# Patient Record
Sex: Female | Born: 1935 | Race: White | Hispanic: No | Smoking: Never smoker
Health system: Southern US, Community
[De-identification: ages and names within clinical notes are randomized; demographics above are authoritative.]

## PROBLEM LIST (undated history)

## (undated) DIAGNOSIS — N186 End stage renal disease: Secondary | ICD-10-CM

## (undated) DIAGNOSIS — R05 Cough: Secondary | ICD-10-CM

## (undated) DIAGNOSIS — J449 Chronic obstructive pulmonary disease, unspecified: Secondary | ICD-10-CM

## (undated) DIAGNOSIS — J45909 Unspecified asthma, uncomplicated: Secondary | ICD-10-CM

## (undated) DIAGNOSIS — E785 Hyperlipidemia, unspecified: Secondary | ICD-10-CM

## (undated) DIAGNOSIS — R058 Other specified cough: Secondary | ICD-10-CM

## (undated) DIAGNOSIS — G4733 Obstructive sleep apnea (adult) (pediatric): Secondary | ICD-10-CM

## (undated) DIAGNOSIS — I219 Acute myocardial infarction, unspecified: Secondary | ICD-10-CM

## (undated) DIAGNOSIS — I739 Peripheral vascular disease, unspecified: Secondary | ICD-10-CM

## (undated) DIAGNOSIS — E662 Morbid (severe) obesity with alveolar hypoventilation: Secondary | ICD-10-CM

## (undated) DIAGNOSIS — T464X5A Adverse effect of angiotensin-converting-enzyme inhibitors, initial encounter: Secondary | ICD-10-CM

## (undated) DIAGNOSIS — I2510 Atherosclerotic heart disease of native coronary artery without angina pectoris: Secondary | ICD-10-CM

## (undated) DIAGNOSIS — Z992 Dependence on renal dialysis: Secondary | ICD-10-CM

## (undated) DIAGNOSIS — E669 Obesity, unspecified: Secondary | ICD-10-CM

## (undated) DIAGNOSIS — E039 Hypothyroidism, unspecified: Secondary | ICD-10-CM

## (undated) DIAGNOSIS — I359 Nonrheumatic aortic valve disorder, unspecified: Secondary | ICD-10-CM

## (undated) DIAGNOSIS — I509 Heart failure, unspecified: Secondary | ICD-10-CM

## (undated) DIAGNOSIS — I639 Cerebral infarction, unspecified: Secondary | ICD-10-CM

## (undated) DIAGNOSIS — I10 Essential (primary) hypertension: Secondary | ICD-10-CM

## (undated) DIAGNOSIS — R911 Solitary pulmonary nodule: Secondary | ICD-10-CM

## (undated) DIAGNOSIS — I701 Atherosclerosis of renal artery: Secondary | ICD-10-CM

## (undated) DIAGNOSIS — I779 Disorder of arteries and arterioles, unspecified: Secondary | ICD-10-CM

## (undated) HISTORY — PX: AV FISTULA PLACEMENT: SHX1204

## (undated) HISTORY — DX: Peripheral vascular disease, unspecified: I73.9

## (undated) HISTORY — DX: Morbid (severe) obesity with alveolar hypoventilation: E66.2

## (undated) HISTORY — DX: Obesity, unspecified: E66.9

## (undated) HISTORY — DX: Hypothyroidism, unspecified: E03.9

## (undated) HISTORY — DX: Hyperlipidemia, unspecified: E78.5

## (undated) HISTORY — DX: Cough: R05

## (undated) HISTORY — DX: Other specified cough: R05.8

## (undated) HISTORY — PX: DIALYSIS FISTULA CREATION: SHX611

## (undated) HISTORY — DX: Cerebral infarction, unspecified: I63.9

## (undated) HISTORY — DX: Obstructive sleep apnea (adult) (pediatric): G47.33

## (undated) HISTORY — DX: Solitary pulmonary nodule: R91.1

## (undated) HISTORY — DX: Atherosclerotic heart disease of native coronary artery without angina pectoris: I25.10

## (undated) HISTORY — DX: Adverse effect of angiotensin-converting-enzyme inhibitors, initial encounter: T46.4X5A

## (undated) HISTORY — DX: Unspecified asthma, uncomplicated: J45.909

## (undated) HISTORY — DX: Nonrheumatic aortic valve disorder, unspecified: I35.9

## (undated) HISTORY — DX: Atherosclerosis of renal artery: I70.1

## (undated) HISTORY — DX: Disorder of arteries and arterioles, unspecified: I77.9

## (undated) HISTORY — DX: Essential (primary) hypertension: I10

---

## 1971-09-06 HISTORY — PX: TUBAL LIGATION: SHX77

## 1977-09-05 HISTORY — PX: INNER EAR SURGERY: SHX679

## 1983-09-06 HISTORY — PX: ABDOMINAL HYSTERECTOMY: SHX81

## 1994-09-05 HISTORY — PX: CORONARY ARTERY BYPASS GRAFT: SHX141

## 2004-07-01 ENCOUNTER — Ambulatory Visit: Payer: Self-pay | Admitting: Family Medicine

## 2005-03-02 ENCOUNTER — Ambulatory Visit: Payer: Self-pay | Admitting: Family Medicine

## 2005-03-05 ENCOUNTER — Ambulatory Visit: Payer: Self-pay | Admitting: Family Medicine

## 2005-04-05 ENCOUNTER — Ambulatory Visit: Payer: Self-pay | Admitting: Family Medicine

## 2005-05-06 ENCOUNTER — Ambulatory Visit: Payer: Self-pay | Admitting: Family Medicine

## 2005-07-19 ENCOUNTER — Ambulatory Visit: Payer: Self-pay | Admitting: Family Medicine

## 2005-09-13 ENCOUNTER — Ambulatory Visit: Payer: Self-pay | Admitting: Cardiology

## 2005-09-29 ENCOUNTER — Ambulatory Visit: Payer: Self-pay | Admitting: Cardiology

## 2005-10-26 ENCOUNTER — Ambulatory Visit: Payer: Self-pay | Admitting: Cardiology

## 2005-12-05 ENCOUNTER — Ambulatory Visit: Payer: Self-pay | Admitting: Cardiology

## 2005-12-06 ENCOUNTER — Ambulatory Visit: Payer: Self-pay | Admitting: Cardiology

## 2005-12-06 ENCOUNTER — Ambulatory Visit (HOSPITAL_COMMUNITY): Admission: RE | Admit: 2005-12-06 | Discharge: 2005-12-06 | Payer: Self-pay | Admitting: Cardiology

## 2006-01-05 ENCOUNTER — Ambulatory Visit: Payer: Self-pay | Admitting: Nurse Practitioner

## 2006-01-05 ENCOUNTER — Encounter: Payer: Self-pay | Admitting: Pulmonary Disease

## 2006-08-03 ENCOUNTER — Ambulatory Visit: Payer: Self-pay | Admitting: Family Medicine

## 2007-04-24 ENCOUNTER — Ambulatory Visit: Payer: Self-pay | Admitting: Internal Medicine

## 2007-06-08 ENCOUNTER — Ambulatory Visit: Payer: Self-pay | Admitting: Internal Medicine

## 2007-06-19 ENCOUNTER — Ambulatory Visit: Payer: Self-pay

## 2007-06-20 ENCOUNTER — Ambulatory Visit: Payer: Self-pay

## 2007-07-27 ENCOUNTER — Ambulatory Visit: Payer: Self-pay | Admitting: Cardiology

## 2007-07-31 ENCOUNTER — Ambulatory Visit: Payer: Self-pay | Admitting: Cardiology

## 2007-07-31 LAB — CONVERTED CEMR LAB
AST: 16 units/L (ref 0–37)
Albumin: 3.9 g/dL (ref 3.5–5.2)
HDL: 49 mg/dL (ref >39)
Indirect Bilirubin: 0.7 mg/dL (ref 0.0–0.9)
LDL Cholesterol: 95 mg/dL (ref 0–99)
Total Protein: 6.8 g/dL (ref 6.0–8.3)
Triglycerides: 68 mg/dL (ref <150)
VLDL: 14 mg/dL (ref 0–40)

## 2007-08-06 ENCOUNTER — Ambulatory Visit: Payer: Self-pay | Admitting: Family Medicine

## 2007-09-28 ENCOUNTER — Ambulatory Visit: Payer: Self-pay | Admitting: Cardiology

## 2007-09-28 LAB — CONVERTED CEMR LAB
AST: 17 units/L (ref 0–37)
Alkaline Phosphatase: 102 units/L (ref 39–117)
Bilirubin, Direct: 0.1 mg/dL (ref 0.0–0.3)
HDL: 47 mg/dL (ref >39)
Indirect Bilirubin: 0.5 mg/dL (ref 0.0–0.9)
LDL Cholesterol: 76 mg/dL (ref 0–99)
Total Bilirubin: 0.6 mg/dL (ref 0.3–1.2)

## 2008-01-22 ENCOUNTER — Ambulatory Visit: Payer: Self-pay | Admitting: Cardiology

## 2008-07-18 ENCOUNTER — Ambulatory Visit: Payer: Self-pay | Admitting: Cardiology

## 2008-08-07 ENCOUNTER — Ambulatory Visit: Payer: Self-pay | Admitting: Family Medicine

## 2008-08-13 ENCOUNTER — Encounter: Payer: Self-pay | Admitting: Cardiology

## 2008-08-13 ENCOUNTER — Ambulatory Visit: Payer: Self-pay

## 2008-09-05 HISTORY — PX: CATARACT EXTRACTION: SUR2

## 2008-10-24 ENCOUNTER — Ambulatory Visit: Payer: Self-pay | Admitting: Cardiology

## 2008-10-24 LAB — CONVERTED CEMR LAB
ALT: 15 units/L (ref 0–35)
AST: 13 units/L (ref 0–37)
BUN: 25 mg/dL — ABNORMAL HIGH (ref 6–23)
Calcium: 8.9 mg/dL (ref 8.4–10.5)
Creatinine, Ser: 1.47 mg/dL — ABNORMAL HIGH (ref 0.40–1.20)
HCT: 41.8 % (ref 36.0–46.0)
HDL: 42 mg/dL (ref >39)
Hemoglobin: 14.5 g/dL (ref 12.0–15.0)
MCHC: 34.7 g/dL (ref 30.0–36.0)
MCV: 92.5 fL (ref 78.0–100.0)
Platelets: 183 10*3/uL (ref 150–400)
Pro B Natriuretic peptide (BNP): 105.5 pg/mL — ABNORMAL HIGH (ref 0.0–100.0)
RDW: 13.7 % (ref 11.5–15.5)
Total Bilirubin: 0.8 mg/dL (ref 0.3–1.2)
Total CHOL/HDL Ratio: 4.3
VLDL: 27 mg/dL (ref 0–40)

## 2008-12-09 ENCOUNTER — Ambulatory Visit: Payer: Medicare Other | Admitting: Family Medicine

## 2009-01-03 ENCOUNTER — Ambulatory Visit: Payer: Medicare Other | Admitting: Family Medicine

## 2009-01-29 DIAGNOSIS — I10 Essential (primary) hypertension: Secondary | ICD-10-CM

## 2009-01-29 DIAGNOSIS — I701 Atherosclerosis of renal artery: Secondary | ICD-10-CM | POA: Insufficient documentation

## 2009-01-29 DIAGNOSIS — I359 Nonrheumatic aortic valve disorder, unspecified: Secondary | ICD-10-CM | POA: Insufficient documentation

## 2009-01-29 DIAGNOSIS — E785 Hyperlipidemia, unspecified: Secondary | ICD-10-CM

## 2009-01-29 DIAGNOSIS — E119 Type 2 diabetes mellitus without complications: Secondary | ICD-10-CM | POA: Insufficient documentation

## 2009-01-29 DIAGNOSIS — N289 Disorder of kidney and ureter, unspecified: Secondary | ICD-10-CM | POA: Insufficient documentation

## 2009-01-29 DIAGNOSIS — E663 Overweight: Secondary | ICD-10-CM | POA: Insufficient documentation

## 2009-01-29 DIAGNOSIS — G4733 Obstructive sleep apnea (adult) (pediatric): Secondary | ICD-10-CM

## 2009-02-03 ENCOUNTER — Ambulatory Visit: Payer: Medicare Other | Admitting: Family Medicine

## 2009-02-17 ENCOUNTER — Ambulatory Visit: Payer: Self-pay | Admitting: Cardiology

## 2009-02-17 ENCOUNTER — Encounter: Payer: Self-pay | Admitting: Cardiology

## 2009-02-17 DIAGNOSIS — I5032 Chronic diastolic (congestive) heart failure: Secondary | ICD-10-CM

## 2009-03-12 ENCOUNTER — Encounter: Payer: Self-pay | Admitting: Cardiology

## 2009-04-16 ENCOUNTER — Telehealth: Payer: Self-pay | Admitting: Cardiology

## 2009-05-20 ENCOUNTER — Encounter: Payer: Self-pay | Admitting: Cardiology

## 2009-05-20 ENCOUNTER — Ambulatory Visit: Payer: Self-pay | Admitting: Cardiovascular Disease

## 2009-05-21 LAB — CONVERTED CEMR LAB
ALT: 13 units/L (ref 0–35)
AST: 12 units/L (ref 0–37)
CO2: 27 meq/L (ref 19–32)
Chloride: 101 meq/L (ref 96–112)
Creatinine, Ser: 1.41 mg/dL — ABNORMAL HIGH (ref 0.40–1.20)
HCT: 41.7 % (ref 36.0–46.0)
Hgb A1c MFr Bld: 8.1 % — ABNORMAL HIGH (ref 4.6–6.1)
MCV: 96.8 fL (ref 78.0–100.0)
Platelets: 182 10*3/uL (ref 150–400)
RDW: 15 % (ref 11.5–15.5)
Sodium: 139 meq/L (ref 135–145)
Total Bilirubin: 0.5 mg/dL (ref 0.3–1.2)
Total CHOL/HDL Ratio: 3
Total Protein: 6.9 g/dL (ref 6.0–8.3)
VLDL: 18 mg/dL (ref 0–40)
Vit D, 25-Hydroxy: 25 ng/mL — ABNORMAL LOW (ref 30–89)
WBC: 9.6 10*3/uL (ref 4.0–10.5)

## 2009-06-01 ENCOUNTER — Ambulatory Visit: Payer: Self-pay | Admitting: Cardiology

## 2009-08-18 ENCOUNTER — Ambulatory Visit: Payer: Self-pay

## 2009-08-18 ENCOUNTER — Encounter: Payer: Self-pay | Admitting: Cardiology

## 2009-08-21 ENCOUNTER — Ambulatory Visit: Payer: Medicare Other | Admitting: Family Medicine

## 2009-09-11 ENCOUNTER — Encounter: Payer: Self-pay | Admitting: Cardiology

## 2009-12-08 ENCOUNTER — Encounter: Payer: Self-pay | Admitting: Cardiovascular Disease

## 2009-12-08 ENCOUNTER — Encounter: Payer: Self-pay | Admitting: Cardiology

## 2009-12-08 LAB — CONVERTED CEMR LAB
ALT: 16 units/L
Albumin: 3.6 g/dL
BUN: 23 mg/dL
BUN: 23 mg/dL
Brain Natriuretic Peptide: 61.9
Chloride: 98 meq/L
Cholesterol: 122 mg/dL
Creatinine, Ser: 1.3 mg/dL
GFR calc Af Amer: 49 mL/min
GFR calc non Af Amer: 40 mL/min
HCT: 41.4 %
HDL: 41.2 mg/dL
Hemoglobin: 13.6 g/dL
LDL Cholesterol: 61.8 mg/dL
RBC: 4.41 M/uL
RDW: 14.6 %
Total Bilirubin: 0.1 mg/dL
Triglyceride fasting, serum: 95 mg/dL
WBC: 9.6 10*3/uL

## 2009-12-10 ENCOUNTER — Encounter: Payer: Self-pay | Admitting: Cardiology

## 2009-12-14 ENCOUNTER — Ambulatory Visit: Payer: Self-pay | Admitting: Cardiology

## 2009-12-14 DIAGNOSIS — R0989 Other specified symptoms and signs involving the circulatory and respiratory systems: Secondary | ICD-10-CM | POA: Insufficient documentation

## 2009-12-15 LAB — CONVERTED CEMR LAB: Pro B Natriuretic peptide (BNP): 82 pg/mL (ref 0.0–100.0)

## 2009-12-17 ENCOUNTER — Encounter: Payer: Self-pay | Admitting: Cardiology

## 2009-12-18 ENCOUNTER — Ambulatory Visit: Payer: Self-pay | Admitting: Cardiology

## 2009-12-18 ENCOUNTER — Encounter: Payer: Self-pay | Admitting: Cardiovascular Disease

## 2009-12-22 LAB — CONVERTED CEMR LAB
CO2: 26 meq/L (ref 19–32)
Chloride: 97 meq/L (ref 96–112)
Creatinine, Ser: 1.24 mg/dL — ABNORMAL HIGH (ref 0.40–1.20)
Glucose, Bld: 170 mg/dL — ABNORMAL HIGH (ref 70–99)

## 2009-12-24 ENCOUNTER — Ambulatory Visit: Payer: Self-pay | Admitting: Cardiology

## 2009-12-24 DIAGNOSIS — R0602 Shortness of breath: Secondary | ICD-10-CM

## 2009-12-24 DIAGNOSIS — R079 Chest pain, unspecified: Secondary | ICD-10-CM

## 2009-12-30 ENCOUNTER — Telehealth (INDEPENDENT_AMBULATORY_CARE_PROVIDER_SITE_OTHER): Payer: Self-pay | Admitting: *Deleted

## 2009-12-30 ENCOUNTER — Encounter: Payer: Self-pay | Admitting: Cardiology

## 2009-12-31 ENCOUNTER — Encounter: Payer: Self-pay | Admitting: Cardiology

## 2009-12-31 ENCOUNTER — Ambulatory Visit: Payer: Self-pay

## 2009-12-31 ENCOUNTER — Ambulatory Visit: Payer: Self-pay | Admitting: Cardiology

## 2009-12-31 ENCOUNTER — Encounter (HOSPITAL_COMMUNITY): Admission: RE | Admit: 2009-12-31 | Discharge: 2010-03-10 | Payer: Self-pay | Admitting: Cardiology

## 2010-01-04 ENCOUNTER — Encounter: Payer: Self-pay | Admitting: Cardiology

## 2010-01-04 ENCOUNTER — Ambulatory Visit: Payer: Self-pay

## 2010-01-15 ENCOUNTER — Ambulatory Visit: Payer: Self-pay | Admitting: Pulmonary Disease

## 2010-01-15 DIAGNOSIS — F329 Major depressive disorder, single episode, unspecified: Secondary | ICD-10-CM

## 2010-01-15 DIAGNOSIS — G2581 Restless legs syndrome: Secondary | ICD-10-CM

## 2010-01-26 ENCOUNTER — Ambulatory Visit (HOSPITAL_COMMUNITY): Admission: RE | Admit: 2010-01-26 | Discharge: 2010-01-26 | Payer: Self-pay | Admitting: Pulmonary Disease

## 2010-01-28 ENCOUNTER — Encounter: Payer: Self-pay | Admitting: Pulmonary Disease

## 2010-02-05 ENCOUNTER — Encounter: Payer: Self-pay | Admitting: Pulmonary Disease

## 2010-02-05 ENCOUNTER — Ambulatory Visit: Payer: Self-pay | Admitting: Pulmonary Disease

## 2010-02-05 DIAGNOSIS — E662 Morbid (severe) obesity with alveolar hypoventilation: Secondary | ICD-10-CM

## 2010-02-08 ENCOUNTER — Ambulatory Visit: Payer: Self-pay | Admitting: Cardiology

## 2010-02-09 ENCOUNTER — Encounter: Payer: Self-pay | Admitting: Pulmonary Disease

## 2010-02-11 ENCOUNTER — Encounter: Payer: Self-pay | Admitting: Pulmonary Disease

## 2010-02-11 LAB — CONVERTED CEMR LAB
Angiotensin 1 Converting Enzyme: 65 units/L (ref 9–67)
Anti Nuclear Antibody(ANA): NEGATIVE
BUN: 26 mg/dL — ABNORMAL HIGH (ref 6–23)
Bilirubin, Direct: 0.1 mg/dL (ref 0.0–0.3)
CO2: 31 meq/L (ref 19–32)
Calcium: 9.1 mg/dL (ref 8.4–10.5)
Chloride: 97 meq/L (ref 96–112)
Creatinine, Ser: 1.2 mg/dL (ref 0.4–1.2)
Eosinophils Absolute: 0.5 10*3/uL (ref 0.0–0.7)
MCHC: 34.9 g/dL (ref 30.0–36.0)
MCV: 94.7 fL (ref 78.0–100.0)
Monocytes Absolute: 0.6 10*3/uL (ref 0.1–1.0)
Neutrophils Relative %: 68.8 % (ref 43.0–77.0)
Platelets: 179 10*3/uL (ref 150.0–400.0)
Rhuematoid fact SerPl-aCnc: 21.7 intl units/mL — ABNORMAL HIGH (ref 0.0–20.0)
Sed Rate: 45 mm/hr — ABNORMAL HIGH (ref 0–22)
Total Bilirubin: 0.6 mg/dL (ref 0.3–1.2)
WBC: 9.1 10*3/uL (ref 4.5–10.5)

## 2010-02-17 ENCOUNTER — Encounter: Payer: Self-pay | Admitting: Pulmonary Disease

## 2010-02-24 ENCOUNTER — Ambulatory Visit: Payer: Self-pay | Admitting: Pulmonary Disease

## 2010-02-24 DIAGNOSIS — J984 Other disorders of lung: Secondary | ICD-10-CM | POA: Insufficient documentation

## 2010-03-02 ENCOUNTER — Ambulatory Visit: Payer: Self-pay | Admitting: Cardiology

## 2010-03-02 DIAGNOSIS — I25810 Atherosclerosis of coronary artery bypass graft(s) without angina pectoris: Secondary | ICD-10-CM

## 2010-03-05 ENCOUNTER — Ambulatory Visit: Payer: Self-pay | Admitting: Cardiology

## 2010-03-09 LAB — CONVERTED CEMR LAB
ALT: 15 units/L (ref 0–35)
AST: 15 units/L (ref 0–37)
Bilirubin, Direct: 0.1 mg/dL (ref 0.0–0.3)
Cholesterol: 121 mg/dL (ref 0–200)
Indirect Bilirubin: 0.3 mg/dL (ref 0.0–0.9)
VLDL: 21 mg/dL (ref 0–40)

## 2010-03-29 ENCOUNTER — Ambulatory Visit: Payer: Self-pay | Admitting: Pulmonary Disease

## 2010-03-29 DIAGNOSIS — J4520 Mild intermittent asthma, uncomplicated: Secondary | ICD-10-CM

## 2010-04-09 ENCOUNTER — Telehealth: Payer: Self-pay | Admitting: Pulmonary Disease

## 2010-04-23 ENCOUNTER — Encounter: Payer: Self-pay | Admitting: Pulmonary Disease

## 2010-04-30 ENCOUNTER — Encounter: Payer: Self-pay | Admitting: Pulmonary Disease

## 2010-07-13 ENCOUNTER — Ambulatory Visit: Payer: Self-pay | Admitting: Cardiology

## 2010-07-20 ENCOUNTER — Ambulatory Visit: Payer: Self-pay | Admitting: Pulmonary Disease

## 2010-07-23 ENCOUNTER — Telehealth: Payer: Self-pay | Admitting: Cardiology

## 2010-07-23 ENCOUNTER — Encounter: Payer: Self-pay | Admitting: Pulmonary Disease

## 2010-07-23 ENCOUNTER — Ambulatory Visit: Payer: Self-pay | Admitting: Internal Medicine

## 2010-07-26 LAB — CONVERTED CEMR LAB
BUN: 28 mg/dL — ABNORMAL HIGH (ref 6–23)
Glucose, Bld: 142 mg/dL — ABNORMAL HIGH (ref 70–99)
Potassium: 4.5 meq/L (ref 3.5–5.3)

## 2010-08-03 ENCOUNTER — Ambulatory Visit: Payer: Self-pay | Admitting: Cardiology

## 2010-09-07 ENCOUNTER — Telehealth (INDEPENDENT_AMBULATORY_CARE_PROVIDER_SITE_OTHER): Payer: Self-pay | Admitting: *Deleted

## 2010-09-07 ENCOUNTER — Encounter: Payer: Self-pay | Admitting: Cardiology

## 2010-09-07 ENCOUNTER — Ambulatory Visit: Admission: RE | Admit: 2010-09-07 | Discharge: 2010-09-07 | Payer: Self-pay | Source: Home / Self Care

## 2010-09-08 ENCOUNTER — Ambulatory Visit: Payer: Medicare Other | Admitting: Family Medicine

## 2010-09-08 ENCOUNTER — Encounter: Payer: Self-pay | Admitting: Cardiology

## 2010-09-08 ENCOUNTER — Ambulatory Visit
Admission: RE | Admit: 2010-09-08 | Discharge: 2010-09-08 | Payer: Self-pay | Source: Home / Self Care | Attending: Cardiology | Admitting: Cardiology

## 2010-09-13 ENCOUNTER — Encounter: Payer: Self-pay | Admitting: Cardiology

## 2010-09-13 LAB — CONVERTED CEMR LAB
Albumin: 3.9 g/dL (ref 3.5–5.2)
Alkaline Phosphatase: 71 units/L (ref 39–117)
CO2: 22 meq/L (ref 19–32)
Chloride: 98 meq/L (ref 96–112)
Creatinine, Ser: 1.68 mg/dL — ABNORMAL HIGH (ref 0.40–1.20)
HDL: 41 mg/dL (ref >39)
LDL Cholesterol: 73 mg/dL (ref 0–99)
Potassium: 4.5 meq/L (ref 3.5–5.3)
Sodium: 132 meq/L — ABNORMAL LOW (ref 135–145)
Total Bilirubin: 0.6 mg/dL (ref 0.3–1.2)
Total CHOL/HDL Ratio: 3.1
Total Protein: 6.6 g/dL (ref 6.0–8.3)
Triglycerides: 64 mg/dL (ref <150)
VLDL: 13 mg/dL (ref 0–40)

## 2010-09-17 ENCOUNTER — Encounter: Payer: Self-pay | Admitting: Cardiology

## 2010-10-06 NOTE — Progress Notes (Signed)
Summary: Nuclear Pre-Procedure  Phone Note Outgoing Call   Call placed by: Perrin Maltese, EMT-P,  December 30, 2009 9:24 AM Summary of Call: Reviewed information on Myoview Information Sheet (see scanned document for further details).  Spoke with patient.     Nuclear Med Background Indications for Stress Test: Evaluation for Ischemia, Graft Patency   History: CABG, Echo, Myocardial Infarction, Myocardial Perfusion Study  History Comments: '96 MI - CABG 10/08 MPS NL EF 77% 12/10 ECHO EF 0000000  Diastolic Dysfunction--CHF Family H/O AAA  Symptoms: Chest Tightness, DOE, SOB    Nuclear Pre-Procedure Cardiac Risk Factors: Hypertension, Lipids, Obesity, PVD Height (in): 61  Nuclear Med Study Referring MD:  D.McLean

## 2010-10-06 NOTE — Letter (Signed)
Summary: [facility] Office Note  [facility] Office Note   Imported By: Sallee Provencal 01/26/2010 14:04:17  _____________________________________________________________________  External Attachment:    Type:   Image     Comment:   External Document

## 2010-10-06 NOTE — Letter (Signed)
Summary: [facility]   Imported By: Bubba Hales 05/17/2010 10:51:24  _____________________________________________________________________  External Attachment:    Type:   Image     Comment:   External Document

## 2010-10-06 NOTE — Assessment & Plan Note (Signed)
Summary: 3 weeks w/pft/apc   Copy to:  [name], [name] Primary Provider/Referring Provider:  Juluis Pitch, MD  CC:  3 wk follow up with PFTs.  Pt states since Cpap pressure was lowered and she is having more difficult time breathing.[name]  History of Present Illness: 75 yo female with OSA, Insomnia, RLS, and Dyspnea.  She continues to get winded with minimal exertion.  She does not have much cough, wheeze, or sputum.  She has not had fever or sweats.    She was recently switched to auto CPAP.  CXR  Procedure date:  01/26/2010  Findings:      CHEST - 2 VIEW   Comparison: 12/06/2005   Findings: Cardiomegaly and evidence of previous cardiac surgery again noted. Pulmonary vascular congestion is identified. Focal opacity on the lateral view of the lower lobe may represent pneumonia or atelectasis. No evidence of pleural effusion or pneumothorax identified. No acute bony abnormalities are present.   IMPRESSION: Cardiomegaly with pulmonary vascular congestion.   Lower lobe focal opacity on the lateral view - question pneumonia versus atelectasis.  Imaging Exam  Procedure date:  01/26/2010  Findings:      NUCLEAR MEDICINE VENTILATION - PERFUSION LUNG SCAN   Technique:  Wash-in, equilibrium, and wash-out phase ventilation images were obtained using Xe-133 gas.  Perfusion images were obtained in multiple projections after intravenous injection of Tc- 21m MAA.   Radiopharmaceuticals:  11.5 mCi Xe-133 gas and 5.5 mCi Tc-23m MAA.   Comparison:  Chest radiograph examination of same date.   Findings:   Ventilation study: On breath-holding and equilibrium phases there is no evidence of lobar or segmental defect.  No significant air trapping was seen on washout phase.   Perfusion study there is normal distribution of the MAA particles. No lobar or segmental perfusion defect is seen.  No ventilation perfusion mismatch is seen.   IMPRESSION: No lobar or  segmental defect is seen on perfusion or ventilation imaging.  No ventilation perfusion mismatch is evident.   Examination is very low probability for pulmonary embolism.  CXR  Procedure date:  02/05/2010  Findings:      CHEST - 2 VIEW   Comparison: 01/26/2010   Findings: The heart is mildly enlarged but stable.  Stable surgical changes.  Stable tortuosity and calcification of the thoracic aorta.  There are chronic bronchitic type lung changes but no definite acute overlying pulmonary process.  Stable advanced degenerative changes in the thoracic spine are noted.   IMPRESSION: Mild stable cardiac enlargement. Chronic lung changes without definite acute overlying pulmonary process.   Current Medications (verified): 1)  Catapres-Tts-2 0.2 Mg/24hr Ptwk (Clonidine Hcl) .... As Directed 2)  Carvedilol 25 Mg Tabs (Carvedilol) .... Take One Tablet By Mouth Twice A Day 3)  Diovan 320 Mg Tabs (Valsartan) .[name].. 1 Tab Once Daily 4)  Furosemide 40 Mg Tabs (Furosemide) .... 2 By Mouth in The Am, and 1 By Mouth in The Pm 5)  Aspirin 81 Mg Tbec (Aspirin) .... Take One Tablet By Mouth Daily 6)  Crestor 40 Mg Tabs (Rosuvastatin Calcium) .... Take One Tablet By Mouth Daily. 7)  Glipizide 10 Mg Tabs (Glipizide) .[name].. 1 By Mouth Two Times A Day 8)  Lantus 100 Unit/ml Soln (Insulin Glargine) .... Use As Directed 9)  Synthroid 100 Mcg Tabs (Levothyroxine Sodium) .[name].. 1 Tab Once Daily 10)  Calcitriol 0.25 Mcg Caps (Calcitriol) .[name].. 1 Tab M, W, F 11)  Fluoxetine Hcl 20 Mg Caps (Fluoxetine Hcl) .[name].. 1 Tab  Once Daily 12)  Tandem 162-115.2 Mg Caps (Ferrous Fum-Iron Polysacch) .[name].. 1 Tab Once Daily  Allergies: 1)  ! Norvasc 2)  ! Codeine 3)  ! Clonidine Hcl 4)  ! Ace Inhibitors 5)  * Metformin  Past History:  Past Medical History: 1. Hyperlipidemia. 2. Resistant hypertension times many years.  The patient does have renal artery stenosis.  She has tried calcium channel blockers in the past and  states that she would not take them now because she had some problems with her gums which her dentist identified as calcium-channel blocker side effects. 3. Type 2 diabetes. 4. Coronary artery disease, status post anterior MI in 1996 followed by coronary artery bypass grafting.  The patient's most recent Myoview was done in October 2008, which showed an EF of 77% with no perfusion defects. 5. Renal artery stenosis.  The patient has an occluded right renal artery and an atrophic right kidney.  There is 20% left renal artery stenosis.  This was seen by catheterization in 2007. 6. History of diastolic congestive heart failure.  Most recent echo (12/10) showed EF 55-60% with mild LVH, grade I diastolic dysfunction, mild LAE.  7. Aortic valve disorder: Echo in 2009 showed mean aortic valve gradient of 13 mmHg, suggesting very mild stenosis.  Echo (12/10) suggested aortic sclerosis only.   8. Obesity. 9.Chronic kidney disease.  Most recent creatinine was from February 2010 and was 1.49.  10. Obstructive sleep apnea       - PSG 01/05/06 AHI 24.8       - CPAP 15 cm H2O 11. Chronic dyspnea      - PFT 02/05/10 FEV1 1.38(&&%), FEV1% 73, TLC 3.78(86%), DLCO 48%, +BD 12. ACE inhibitor cough 13. Hypothyroidism 14. Hypoxemia       - SpO2 83% with exertion       - 2 liters oxygen with exertion and sleep  Vital Signs:  Patient profile:   75 year old female Height:      62 inches Weight:      255 pounds BMI:     46.81 O2 Sat:      92 % on Room air Temp:     97.9 degrees F oral Pulse rate:   64 / minute BP sitting:   116 / 72  (right arm) Cuff size:   large  Vitals Entered By: Raymondo Band RN (February 05, 2010 10:58 AM)  O2 Flow:  Room air CC: 3 wk follow up with PFTs.  Pt states since Cpap pressure was lowered, she is having more difficult time breathing. Comments Medications reviewed with patient Daytime contact number verified with patient. Raymondo Band RN  February 05, 2010 10:58 AM  Ambulatory  Pulse Oximetry  Resting; HR_68____    02 Sat_92% RA____  Lap1 (185 feet)   HR_____   02 Sat_____ Lap2 (185 feet)   HR_____   02 Sat_____    Lap3 (185 feet)   HR_____   02 Sat_____  ___Test Completed without Difficulty _x__Test Stopped due to:       At approx 25ft, pt o2 sat dropped to 83% RA with pulse of 70.  Pt was placed on 2L cont, o2 sat increased to 95% with pulse of 66. Raymondo Band RN  February 05, 2010 11:29 AM    Physical Exam  General:  obese.   Nose:  no deformity, discharge, inflammation, or lesions Mouth:  MP 4, no exudate Neck:  no JVD.   Lungs:  diminished  breath sounds, faint crackle left base, no wheezing Heart:  regular rhythm, normal rate, and no murmurs.   Extremities:  1+ ankle edema Cervical Nodes:  no significant adenopathy   Impression & Recommendations:  Problem # 1:  SLEEP APNEA, OBSTRUCTIVE (ICD-327.23) I adjust her CPAP set up based on review of her auto titration.  Problem # 2:  HYPOXEMIA (ICD-799.02) She has oxygen desaturation with exertion.  Will start her on supplemental oxygen at 2 liters with exertion and sleep.  Will arrange for overnight oximetry while using CPAP and 2 liters supplemental oxygen.  Problem # 3:  INADEQUATE SLEEP HYGIENE (ICD-307.49)  I reviewed proper sleep hygiene technique.  Problem # 4:  RESTLESS LEG SYNDROME (ICD-333.94)  She has symptoms suggestive of restless legs.  She is already on iron supplementation.  I will monitor her symptoms for now, and re-assess whether she needs specific therapy for her legs after her sleep apnea and insomnia are better controlled.  Problem # 5:  SHORTNESS OF BREATH (ICD-786.05) She has interstitial changes on chest xray, and diffusion defect on pulmonary function test.  I will schedule her for CT chest to further evaluate for interstitial lung disease.  Will also have her undergo lab tests to further evaluate.  Depending on these results will determine if a course of prednisone may be  warranted.  She did have mild bronchodilator response, and small airway disease on PFT also.  Will give her empiric trial of proair, and decide if she needs additional inhaler therapy.  Problem # 6:  OVERWEIGHT/OBESITY (ICD-278.02) A good portion of her health problems are likey related to her weight and deconditioning.  Will need to address this further after her other pulmonary issues are more stable.  Medications Added to Medication List This Visit: 1)  Proair Hfa 108 (90 Base) Mcg/act Aers (Albuterol sulfate) .... Two puffs up to four times per day as needed for cough, wheeze, chest congestion, or shortness of breath 2)  Prednisone 20 Mg Tabs (Prednisone) .... 2 pills once daily  Complete Medication List: 1)  Proair Hfa 108 (90 Base) Mcg/act Aers (Albuterol sulfate) .... Two puffs up to four times per day as needed for cough, wheeze, chest congestion, or shortness of breath 2)  Catapres-tts-2 0.2 Mg/24hr Ptwk (Clonidine hcl) .... As directed 3)  Carvedilol 25 Mg Tabs (Carvedilol) .... Take one tablet by mouth twice a day 4)  Diovan 320 Mg Tabs (Valsartan) .[name].. 1 tab once daily 5)  Furosemide 40 Mg Tabs (Furosemide) .... 2 by mouth in the am, and 1 by mouth in the pm 6)  Aspirin 81 Mg Tbec (Aspirin) .... Take one tablet by mouth daily 7)  Crestor 40 Mg Tabs (Rosuvastatin calcium) .... Take one tablet by mouth daily. 8)  Glipizide 10 Mg Tabs (Glipizide) .[name].. 1 by mouth two times a day 9)  Lantus 100 Unit/ml Soln (Insulin glargine) .... Use as directed 10)  Synthroid 100 Mcg Tabs (Levothyroxine sodium) .[name].. 1 tab once daily 11)  Calcitriol 0.25 Mcg Caps (Calcitriol) .[name].. 1 tab m, w, f 12)  Fluoxetine Hcl 20 Mg Caps (Fluoxetine hcl) .[name].. 1 tab once daily 13)  Tandem 162-115.2 Mg Caps (Ferrous fum-iron polysacch) .[name].. 1 tab once daily 14)  Prednisone 20 Mg Tabs (Prednisone) .... 2 pills once daily  Other Orders: Est. Patient Level IV [id number]) TLB-BMP (Basic Metabolic Panel-BMET)  (99991111) TLB-CBC Platelet - w/Differential (85025-CBCD) TLB-Hepatic/Liver Function Pnl (80076-HEPATIC) TLB-BNP (B-Natriuretic Peptide) (83880-BNPR) TLB-Rheumatoid Factor (RA) [id number]) TLB-Sedimentation Rate (ESR) (85652-ESR)  T-Angiotensin i-Converting Enzyme [phone]) T-Antinuclear Antib (ANA) [phone]) Prescription Created Electronically [id number]) DME Referral (DME) Radiology Referral (Radiology) T-2 View CXR (71020TC)  Patient Instructions: 1)  Proair two puffs up to four times per day as needed  2)  Lab test today 3)  Will set up oxygen at 2 liters with activity and sleep 4)  Will schedule CT chest 5)  Will give prescription for prednisone; do not start until after receiving call about results of blood test and CT chest 6)  Follow up in 2 to 3 weeks Prescriptions: PREDNISONE 20 MG TABS (PREDNISONE) 2 pills once daily  #60 x 1   Entered and Authorized by:   Chesley Mires MD   Signed by:   Chesley Mires MD on 02/05/2010   Method used:   Print then Give to Patient   RxID:   [id number] PROAIR HFA 108 (90 BASE) MCG/ACT AERS (ALBUTEROL SULFATE) two puffs up to four times per day as needed for cough, wheeze, chest congestion, or shortness of breath  #1 x 6   Entered and Authorized by:   Chesley Mires MD   Signed by:   Chesley Mires MD on 02/05/2010   Method used:   Electronically to        [facility] (retail)       [place], [place]  [address]       Ph: [phone]       Fax: [phone]   RxID:   [id number]

## 2010-10-06 NOTE — Miscellaneous (Signed)
Summary: Pulmonary function test   Pulmonary Function Test Date: 02/05/2010 Height (in.): 62 Gender: Female  Pre-Spirometry FVC    Value: 1.82 L/min   Pred: 2.55 L/min     % Pred: 71 % FEV1    Value: 1.29 L     Pred: 1.79 L     % Pred: 72 % FEV1/FVC  Value: 71 %     Pred: 71 %     % Pred: . % FEF 25-75  Value: 0.74 L/min   Pred: 2.11 L/min     % Pred: 35 %  Post-Spirometry FVC    Value: 1.89 L/min   Pred: 2.55 L/min     % Pred: 74 % FEV1    Value: 1.38 L     Pred: 1.79 L     % Pred: 77 % FEV1/FVC  Value: 73 %     Pred: 71 %     % Pred: . % FEF 25-75  Value: 0.93 L/min   Pred: 2.11 L/min     % Pred: 44 %  Lung Volumes TLC    Value: 3.78 L   % Pred: 86 % RV    Value: 1.83 L   % Pred: 102 % DLCO    Value: 11.9 %   % Pred: 48 % DLCO/VA  Value: 4.00 %   % Pred: 115 %  Comments: Small airway obstruction.  Positive bronchodilator response.  Normal lung volumes.  Moderate diffusion defect.  Clinical Lists Changes  Observations: Added new observation of PFT COMMENTS: Small airway obstruction.  Positive bronchodilator response.  Normal lung volumes.  Moderate diffusion defect. (02/05/2010 14:27) Added new observation of DLCO/VA%EXP: 115 % (02/05/2010 14:27) Added new observation of DLCO/VA: 4.00 % (02/05/2010 14:27) Added new observation of DLCO % EXPEC: 48 % (02/05/2010 14:27) Added new observation of DLCO: 11.9 % (02/05/2010 14:27) Added new observation of RV % EXPECT: 102 % (02/05/2010 14:27) Added new observation of RV: 1.83 L (02/05/2010 14:27) Added new observation of TLC % EXPECT: 86 % (02/05/2010 14:27) Added new observation of TLC: 3.78 L (02/05/2010 14:27) Added new observation of FEF2575%EXPS: 44 % (02/05/2010 14:27) Added new observation of PSTFEF25/75P: 2.11  (02/05/2010 14:27) Added new observation of PSTFEF25/75%: 0.93 L/min (02/05/2010 14:27) Added new observation of PSTFEV1/FCV%: . % (02/05/2010 14:27) Added new observation of FEV1FVCPRDPS: 71 % (02/05/2010  14:27) Added new observation of PSTFEV1/FVC: 73 % (02/05/2010 14:27) Added new observation of POSTFEV1%PRD: 77 % (02/05/2010 14:27) Added new observation of FEV1PRDPST: 1.79 L (02/05/2010 14:27) Added new observation of POST FEV1: 1.38 L/min (02/05/2010 14:27) Added new observation of POST FVC%EXP: 74 % (02/05/2010 14:27) Added new observation of FVCPRDPST: 2.55 L/min (02/05/2010 14:27) Added new observation of POST FVC: 1.89 L (02/05/2010 14:27) Added new observation of FEF % EXPEC: 35 % (02/05/2010 14:27) Added new observation of FEF25-75%PRE: 2.11 L/min (02/05/2010 14:27) Added new observation of FEF 25-75%: 0.74 L/min (02/05/2010 14:27) Added new observation of FEV1/FVC%EXP: . % (02/05/2010 14:27) Added new observation of FEV1/FVC PRE: 71 % (02/05/2010 14:27) Added new observation of FEV1/FVC: 71 % (02/05/2010 14:27) Added new observation of FEV1 % EXP: 72 % (02/05/2010 14:27) Added new observation of FEV1 PREDICT: 1.79 L (02/05/2010 14:27) Added new observation of FEV1: 1.29 L (02/05/2010 14:27) Added new observation of FVC % EXPECT: 71 % (02/05/2010 14:27) Added new observation of FVC PREDICT: 2.55 L (02/05/2010 14:27) Added new observation of FVC: 1.82 L (02/05/2010 14:27) Added new observation of PFT HEIGHT: 62  (02/05/2010 14:27) Added  new observation of PFT DATE: 02/05/2010  (02/05/2010 14:27)

## 2010-10-06 NOTE — Assessment & Plan Note (Signed)
Summary: f27m   Primary Provider:  Juluis Pitch, MD  CC:  ROV; C/O chronic SOB.  History of Present Illness: This is a 75 year old with history of coronary artery disease status post CABG, resistant hypertension, renal artery stenosis, morbid obesity, and diastolic congestive heart failure presents to [facility]iology Clinic for followup.  Patient has been more short of breath than before for the last few months.  She has gained 14 lbs since last appointment and says that she has been eating more.  Blood glucose has not been ideally controlled and her insulin has been increased.  She is now short of breath simply walking in her house or after walking about 50 feet outside.  No orthopnea if she uses her CPAP.  She stopped her clonidine patch as she was not able to tolerate TTS-3 (severe fatigue).  She says that she is able to tolerate the TTS-2 patches.  BP is 160/88 today.  She had an episode of chest tightness about 6 months ago while sitting in the chair at the beauty shop. No further CP since that time. Finally, her eye doctor saw some plaque on retinal exam and is concerned that she could have carotid disease with microemboli to the retinal arteries.    Labs (9/10): creatinine 1.4, HCT 41.7, LDL 68, HDL 42, hgbA1c 8.1%  Current Medications (verified): 1)  Lasix 40 Mg Tabs (Furosemide) .[name].. 1 Tab Two Times A Day 2)  Glipizide 10 Mg Tabs (Glipizide) .... 2 Tab Once Daily 3)  Calcitriol 0.25 Mcg Caps (Calcitriol) .[name].. 1 Tab M, W, F 4)  Synthroid 100 Mcg Tabs (Levothyroxine Sodium) .[name].. 1 Tab Once Daily 5)  Tandem 162-115.2 Mg Caps (Ferrous Fum-Iron Polysacch) .[name].. 1 Tab Once Daily 6)  Diovan 320 Mg Tabs (Valsartan) .[name].. 1 Tab Once Daily 7)  Fluoxetine Hcl 20 Mg Caps (Fluoxetine Hcl) .[name].. 1 Tab Once Daily 8)  Lantus 100 Unit/ml Soln (Insulin Glargine) .... Use As Directed 9)  Aspirin 81 Mg Tbec (Aspirin) .... Take One Tablet By Mouth Daily 10)  Carvedilol 25 Mg Tabs (Carvedilol) .... Take One  Tablet By Mouth Twice A Day 11)  Crestor 40 Mg Tabs (Rosuvastatin Calcium) .... Take One Tablet By Mouth Daily.  Allergies: 1)  ! Norvasc 2)  ! Codeine 3)  ! Clonidine Hcl 4)  ! Ace Inhibitors  Past History:  Past Medical History: 1. Hyperlipidemia. 2. Resistant hypertension times many years.  The patient does have renal artery stenosis.  She has tried calcium channel blockers in the past and states that she would not take them now because she had some problems with her gums which her dentist identified as calcium-channel blocker side effects. 3. Type 2 diabetes. 4. Coronary artery disease, status post anterior MI in 1996 followed by coronary artery bypass grafting.  The patient's most recent Myoview was done in October 2008, which showed an EF of 77% with no perfusion defects. 5. Renal artery stenosis.  The patient has an occluded right renal artery and an atrophic right kidney.  There is 20% left renal artery stenosis.  This was seen by catheterization in 2007. 6. History of diastolic congestive heart failure.  Most recent echo (12/10) showed EF 55-60% with mild LVH, grade I diastolic dysfunction, mild LAE.  7. Aortic valve disorder: Echo in 2009 showed mean aortic valve gradient of 13 mmHg, suggesting very mild stenosis.  Echo (12/10) suggested aortic sclerosis only.  8. History of obesity hypoventilation syndrome/OSA.  Patient is now using CPAP.  9.  Obesity. 10.Chronic kidney disease.  Most recent creatinine was from February 2010 and was 1.49.  11. Cataract surgery both eyes 2010.   Family History: Reviewed history from 01/29/2009 and no changes required. Family History of Aortic Aneurysm:  Family History of Diabetes:  Family History of Renal Disease:   Social History: Reviewed history from 06/01/2009 and no changes required. The patient does not drink any alcohol.  She never smoked.  She is still working part time at [employer].   Review of Systems       All systems  reviewed and negative except as per HPI.   Vital Signs:  Patient profile:   75 year old female Height:      61 inches Weight:      250 pounds BMI:     47.41 Pulse rate:   70 / minute BP sitting:   168 / 88  (left arm)  Vitals Entered By: Eliezer Lofts, EMT-P (December 14, 2009 1:26 PM)  Physical Exam  General:  Obese, NAD Neck:  Neck supple, JVP about 10 cm. No masses, thyromegaly or abnormal cervical nodes. Lungs:  Clear bilaterally to auscultation and percussion. Heart:  Non-displaced PMI, chest non-tender; regular rate and rhythm, S1, S2 without rubs or gallops. 2/6 early crescendo-decrescendo systolic M RUSB.  Carotid upstroke normal, no bruit. Pedal pulses difficult to palpate given edema.  1+ edema 1/3 up lower legs bilaterally.  Abdomen:  Bowel sounds positive; abdomen soft and non-tender without masses, organomegaly, or hernias noted. No hepatosplenomegaly.  Obese.  Extremities:  No clubbing or cyanosis. Neurologic:  Alert and oriented x 3. Psych:  Normal affect.   Impression & Recommendations:  Problem # 1:  CAD, ARTERY BYPASS GRAFT (ICD-414.04) No chest pain but significantly increased exertional dyspnea over the last few months.  Last stress test was in 2008.  Given this change, I think that it would be reasonable to do a Lexiscan-myoview to assess for ischemia. She will continue ASA, ARB, Coreg and statin.    Problem # 2:  DIASTOLIC HEART FAILURE, CHRONIC (ICD-428.32) Patient has NYHA class III symptoms with recent worsening.  This could certainly be due to significant weight gain. However, she does appear volume overloaded with increased neck veins.  I will have her increase Lasix to 80 mg qam, 40 mg qpm for 5 days. At that time, I will get a BNP and BMET.  She will decrease Lasix back to 40 mg two times a day after 5 days and I will see her back to reassess next week.  I will also check a BNP today.   Problem # 3:  HYPERTENSION, UNSPECIFIED (ICD-401.9) BP is running high.   I am going to start her back on clonidine TTS-2 patches, which she has tolerated in the past.  She cannot take calcium channel blockers.   Problem # 4:  HYPERLIPIDEMIA-MIXED ([id number]) Patient recently had lipids done at Dr. Reuel Boom office.  We will call for a copy of her labs.  Goal LDL < 70.    Problem # 5:  CAROTID DISEASE Patient's eye doctor saw evidence of possible embolic disease in her retinal vessels.  I will set her up for carotid dopplers.   Other Orders: Carotid Duplex (Carotid Duplex) T-BNP  (B Natriuretic Peptide) [id number]) Nuclear Stress Test (Nuc Stress Test)  Patient Instructions: 1)  Your physician recommends that you schedule a follow-up appointment in:1 week 2)  Your physician recommends that you return for lab work on Friday (bmet) 3)  Your  physician has recommended you make the following change in your medication: restart catapres patch, increase lasix to 80 mg every morning and 40 mg every evening for 5 days then return to lasix 40 mg twice a day 4)  Your physician has requested that you have a carotid duplex. This test is an ultrasound of the carotid arteries in your neck. It looks at blood flow through these arteries that supply the brain with blood. Allow one hour for this exam. There are no restrictions or special instructions. 5)  Your physician has requested that you have an adenosine myoview.  For further information please visit [email or web address].  Please follow instruction sheet, as given. Prescriptions: CATAPRES-TTS-2 0.2 MG/24HR PTWK (CLONIDINE HCL) as directed  #4 x 6   Entered by:   Gabriel Cirri, RN, BSN   Authorized by:   [name], MD   Signed by:   Gabriel Cirri, RN, BSN on 12/14/2009   Method used:   Electronically to        [facility].* (retail)       [place], [place]  [address]       Ph: [phone]       Fax: [phone]   RxID:    [id number]

## 2010-10-06 NOTE — Assessment & Plan Note (Signed)
Summary: F.U 2 MONTHS  Medications Added CATAPRES-TTS-2 0.2 MG/24HR PTWK (CLONIDINE HCL) Apply 2 patches equal to 0.4mg /24hr/weekly CATAPRES-TTS-3 0.3 MG/24HR PTWK (CLONIDINE HCL) as directed      Allergies Added:   Referring Provider:  [name], [name] Primary Provider:  Juluis Pitch, MD  CC:  Shortness of breath; no more than usual.  History of Present Illness: This is a 75 year old with history of coronary artery disease status post CABG, resistant hypertension, renal artery stenosis, morbid obesity, diastolic congestive heart failure, and OHS/OSA presents to [facility]iology Clinic for followup.  When I last saw her, I set her up to see pulmonology (Dr. Halford Chessman).  He adjusted her CPAP and diagnosed her with probable obesity-hypoventilation syndrome.  She was noted to have significant arterial desaturation with mild exertion and has been started on home oxygen.  We did a Lexiscan-myoview that showed no evidence for ischemia or infarction.  V/Q scan also was negative.  She continues to be short of breath but has had some improvement with oxygen.  She is still short of breath after walking about 50-100 feet.  No chest pain.  Unfortunately, she has gained another 2.5 lbs since I last saw her.  She is not very active.  BP is still running high, 159/91 today.    Labs (9/10): creatinine 1.4, HCT 41.7, LDL 68, HDL 42, hgbA1c 8.1% Labs (4/11): BNP 82, LDL 62 Labs (12/23/09): K 4, creatinine 1.24 Labs (6/11): K 4.2, creatinine 1.2, BNP 94  Current Medications (verified): 1)  Qvar 80 Mcg/act Aers (Beclomethasone Dipropionate) .... Two Puffs Two Times A Day 2)  Proair Hfa 108 (90 Base) Mcg/act Aers (Albuterol Sulfate) .... Two Puffs Up To Four Times Per Day As Needed For Cough, Wheeze, Chest Congestion, or Shortness of Breath 3)  Catapres-Tts-2 0.2 Mg/24hr Ptwk (Clonidine Hcl) .... As Directed 4)  Carvedilol 25 Mg Tabs (Carvedilol) .... Take One Tablet By Mouth Twice A Day 5)  Diovan 320  Mg Tabs (Valsartan) .[name].. 1 Tab Once Daily 6)  Furosemide 40 Mg Tabs (Furosemide) .... 2 By Mouth in The Am, and 1 By Mouth in The Pm 7)  Aspirin 81 Mg Tbec (Aspirin) .... Take One Tablet By Mouth Daily 8)  Crestor 40 Mg Tabs (Rosuvastatin Calcium) .... Take One Tablet By Mouth Daily. 9)  Glipizide 10 Mg Tabs (Glipizide) .[name].. 1 By Mouth Two Times A Day 10)  Lantus 100 Unit/ml Soln (Insulin Glargine) .... Use As Directed 11)  Synthroid 100 Mcg Tabs (Levothyroxine Sodium) .[name].. 1 Tab Once Daily 12)  Calcitriol 0.25 Mcg Caps (Calcitriol) .[name].. 1 Tab M, W, F 13)  Fluoxetine Hcl 20 Mg Caps (Fluoxetine Hcl) .[name].. 1 Tab Once Daily 14)  Tandem 162-115.2 Mg Caps (Ferrous Fum-Iron Polysacch) .[name].. 1 Tab Once Daily  Allergies (verified): 1)  ! Norvasc 2)  ! Codeine 3)  ! Clonidine Hcl 4)  ! Ace Inhibitors 5)  * Metformin  Past History:  Past Surgical History: Last updated: 01/15/2010 Tubal ligation 1973 Right ear surgery 1979 Hysterectomy 1985 Coronary artery bypass grafting 1996 Bilateral cataract removal 2010  Family History: Last updated: 01/15/2010 Father - CAD/MI, AAA age 60 Mother - DM, Renal failure age 52 Brother - DM, Renal failure age 81 Sister - DM  Social History: Last updated: 01/15/2010 Married, 3 daughters.  No history of smoking or alcohol use.  Works part time with [employer].  Risk Factors: Alcohol Use: 0 (01/15/2010) Exercise: no (01/29/2009)  Risk Factors: Smoking Status: never (01/15/2010)  Past  Medical History: 1. Hyperlipidemia. 2. Resistant hypertension times many years.  The patient does have renal artery stenosis.  She has tried calcium channel blockers in the past and states that she would not take them now because she had some problems with her gums which her dentist identified as calcium-channel blocker side effects. 3. Type 2 diabetes. 4. Coronary artery disease, status post anterior MI in 1996 followed by coronary artery bypass grafting.  Lexiscan  myoview (4/11): EF 67%, normal perfusion with no evidence for ischemia or infarction.  5. Renal artery stenosis.  The patient has an occluded right renal artery and an atrophic right kidney.  There is 20% left renal artery stenosis.  This was seen by catheterization in 2007. 6. History of diastolic congestive heart failure.  Most recent echo (12/10) showed EF 55-60% with mild LVH, grade I diastolic dysfunction, mild LAE.  7. Aortic valve disorder: Echo in 2009 showed mean aortic valve gradient of 13 mmHg, suggesting very mild stenosis.  Echo (12/10) suggested aortic sclerosis only.   8. Obesity. 9.Chronic kidney disease.  Most recent creatinine was 1.2.  10. Obstructive sleep apnea       - PSG 01/05/06 AHI 24.8       - CPAP 9 cm H2O 11. Mild asthma      - PFT 02/05/10 FEV1 1.38(&&%), FEV1% 73, TLC 3.78(86%), DLCO 48%, +BD 12. ACE inhibitor cough 13. Hypothyroidism 14. Hypoxemia likely from Obesity-Hypoventilation syndrome       - SpO2 83% with exertion       - 2 liters oxygen 24/7 15. Pulmonary nodule Rt upper lobe, borderline mediastinal adenopathy      - CT chest June 2011 16. V/Q scan (5/11): low probability of PE.  17. Carotid dopplers (4/11): Mild disease only.   Family History: Reviewed history from 01/15/2010 and no changes required. Father - CAD/MI, AAA age 39 Mother - DM, Renal failure age 29 Brother - DM, Renal failure age 28 Sister - DM  Social History: Reviewed history from 01/15/2010 and no changes required. Married, 3 daughters.  No history of smoking or alcohol use.  Works part time with [employer].  Review of Systems       All systems reviewed and negative except as per HPI.   Vital Signs:  Patient profile:   75 year old female Height:      62 inches Weight:      254 pounds BMI:     46.63 Pulse rate:   76 / minute BP sitting:   159 / 91  (right arm) Cuff size:   large  Vitals Entered By: Dolores Lory, CMA (March 02, 2010 10:20 AM)  Physical  Exam  General:  Well developed, well nourished, in no acute distress.  Obese.  Neck:  Neck supple, JVP 7-8 cm. No masses, thyromegaly or abnormal cervical nodes. Lungs:  Clear bilaterally to auscultation and percussion. Heart:  Non-displaced PMI, chest non-tender; regular rate and rhythm, S1, S2 without murmurs, rubs or gallops. Carotid upstroke normal, no bruit.  Pedals normal pulses. 1+ ankle edema.  Abdomen:  Bowel sounds positive; abdomen soft and non-tender without masses, organomegaly, or hernias noted. No hepatosplenomegaly.   Extremities:  No clubbing or cyanosis. Neurologic:  Alert and oriented x 3. Psych:  Normal affect.   Impression & Recommendations:  Problem # 1:  SHORTNESS OF BREATH (ICD-786.05) This is likely multifactorial.  She has OSA/obesity-hypoventilation syndrome and is now on home oxygen with some improvement.  She has diastolic CHF.  She also has mild obstructive airways disease responsive to bronchodilator on PFTs (mild asthma).  Finally, she is deconditioned.  I think that weight loss and increased activity are imperative for her.  I encouraged her to try to walk daily for exercise and to try hard with her diet.  Unfortunately, she has continued to gain weight.    Problem # 2:  HYPOXEMIA (ICD-799.02) Obesity-hypoventilation syndrome.  Patient is using her oxygen.  Needs to work on weight loss.   Problem # 3:  CAROTID BRUIT (ICD-785.9) No significant carotid disease.   Problem # 4:  HYPERLIPIDEMIA-MIXED (B2193296.4) Will have her get lipids and LFTs done.  Goal LDL < 70.   Problem # 5:  HYPERTENSION, UNSPECIFIED (ICD-401.9) BP is still high.  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  I will increase her clonidine patch to 0.3 mg/day, if she tolerates this and BP still high, can increase to 0.4 mg/day.  She is already on high dose Coreg and valsartan.  Would consider addition of low dose spironolactone for resistant hypertension next (follow creatinine  carefully).  BP check in 2 wks.   Problem # 6:  CAD, ARTERY BYPASS GRAFT (ICD-414.04) Recent normal myoview.  Continue ASA, Crestor, Coreg, ARB.    Problem # 7:  DIASTOLIC HEART FAILURE, CHRONIC (ICD-428.32) Volume status appears stable.  Continue current dose of Lasix.  BNP was 94 this month.   Patient Instructions: 1)  Your physician recommends that you schedule a follow-up appointment in: 4 months 2)  Your physician recommends that you return for a FASTING lipid profile: at your convience within the next week (lipid/liver) 3)  Your physician has recommended you make the following change in your medication: Incr Catapress patch to 0.3mg /hr/weekly 4)  Your physician has requested that you regularly monitor and record your blood pressure readings 2-3 times over the next couple of weeks and call with readings.  Prescriptions: CATAPRES-TTS-3 0.3 MG/24HR PTWK (CLONIDINE HCL) as directed  #4 x 6   Entered by:   Cordelia Pen, RN   Authorized by:   [name], MD   Signed by:   Cordelia Pen, RN on 03/02/2010   Method used:   Electronically to        [facility] (retail)       [place], [place]  [address]       Ph: [phone]       Fax: [phone]   RxIDDE:[id number] CATAPRES-TTS-2 0.2 MG/24HR PTWK (CLONIDINE HCL) Apply 2 patches equal to 0.4mg /24hr/weekly  #60 x 6   Entered by:   Cordelia Pen, RN   Authorized by:   [name], MD   Signed by:   Cordelia Pen, RN on 03/02/2010   Method used:   Electronically to        [facility] (retail)       [place],   [address]       Ph: [phone]       Fax: [phone]   RxID:   [id number]

## 2010-10-06 NOTE — Letter (Signed)
Summary: [facility]  [facility]   Imported By: Bubba Hales 02/23/2010 08:29:30  _____________________________________________________________________  External Attachment:    Type:   Image     Comment:   External Document

## 2010-10-06 NOTE — Medication Information (Signed)
Summary: [employer]   Imported By: Zenovia Jarred 09/11/2009 10:28:32  _____________________________________________________________________  External Attachment:    Type:   Image     Comment:   External Document

## 2010-10-06 NOTE — Assessment & Plan Note (Signed)
Summary: 3-4 months/apc   Visit Type:  Follow-up Copy to:  [name], [name] Primary Provider/Referring Provider:  Juluis Pitch, MD  CC:  OSA...the patient says she is doing fairly well on cpap...she does c/o leakage with her mask and at times the pressure seems too high or forceful.  History of Present Illness: 75 yo female with 1) OSA on CPAP 9 cm 2) hypoxemia/dyspnea likely from OSA/OHS and dyspnea likely from asthma, small airway disease, deconditioning, and diastolic dysfx, and 3) pulmonary nodule.  She stopped using her Qvar.  She was not sure if it was helping.  She felt more relief from proair, and has been using this once or twice per day.  She still gets occasional cough and clear sputum, mostly in the morning.  She has been using her CPAP on a regular basis.  This helps her sleep and energy during the day.  She has been getting problem with mask leak, but will talk about this with her DME tomorrow.  She continues to use oxygen, but her daughter reports that she does not use this on a regular basis during the day.  She was recently started on micardis for her blood pressure.      Current Medications (verified): 1)  Qvar 80 Mcg/act Aers (Beclomethasone Dipropionate) .... Two Puffs Two Times A Day 2)  Proair Hfa 108 (90 Base) Mcg/act Aers (Albuterol Sulfate) .... Two Puffs Up To Four Times Per Day As Needed For Cough, Wheeze, Chest Congestion, or Shortness of Breath 3)  Catapres-Tts-2 0.2 Mg/24hr Ptwk (Clonidine Hcl) .... Apply One Weekly 4)  Carvedilol 25 Mg Tabs (Carvedilol) .... Take One Tablet By Mouth Twice A Day 5)  Furosemide 40 Mg Tabs (Furosemide) .... 2 By Mouth in The Am, and 1 By Mouth in The Pm 6)  Aspirin 81 Mg Tbec (Aspirin) .... Take One Tablet By Mouth Daily 7)  Crestor 40 Mg Tabs (Rosuvastatin Calcium) .... Take One Tablet By Mouth Daily. 8)  Glipizide 10 Mg Tabs (Glipizide) .[name].. 1 By Mouth Two Times A Day 9)  Lantus 100 Unit/ml Soln  (Insulin Glargine) .... Use As Directed 10)  Calcitriol 0.25 Mcg Caps (Calcitriol) .[name].. 1 Tab M, W, F 11)  Fluoxetine Hcl 40 Mg Caps (Fluoxetine Hcl) .[name].. 1 By Mouth Daily 12)  Tandem 162-115.2 Mg Caps (Ferrous Fum-Iron Polysacch) .[name].. 1 Tab Once Daily 13)  Oxygen 2 Liters .... At Bedtime 14)  Cpap 15)  Micardis 80 Mg Tabs (Telmisartan) .... Take 1 Tablet By Mouth Once A Day 16)  Spironolactone 25 Mg Tabs (Spironolactone) .... Take 1/2 Tablet Daily 17)  Liothyronine Sodium 5 Mcg Tabs (Liothyronine Sodium) .... 2 By Mouth Daily  Allergies (verified): 1)  ! Norvasc 2)  ! Codeine 3)  ! Clonidine Hcl 4)  ! Ace Inhibitors 5)  * Metformin  Past History:  Past Medical History: Reviewed history from 03/02/2010 and no changes required. 1. Hyperlipidemia. 2. Resistant hypertension times many years.  The patient does have renal artery stenosis.  She has tried calcium channel blockers in the past and states that she would not take them now because she had some problems with her gums which her dentist identified as calcium-channel blocker side effects. 3. Type 2 diabetes. 4. Coronary artery disease, status post anterior MI in 1996 followed by coronary artery bypass grafting.  Lexiscan myoview (4/11): EF 67%, normal perfusion with no evidence for ischemia or infarction.  5. Renal artery stenosis.  The patient has an occluded right renal  artery and an atrophic right kidney.  There is 20% left renal artery stenosis.  This was seen by catheterization in 2007. 6. History of diastolic congestive heart failure.  Most recent echo (12/10) showed EF 55-60% with mild LVH, grade I diastolic dysfunction, mild LAE.  7. Aortic valve disorder: Echo in 2009 showed mean aortic valve gradient of 13 mmHg, suggesting very mild stenosis.  Echo (12/10) suggested aortic sclerosis only.   8. Obesity. 9.Chronic kidney disease.  Most recent creatinine was 1.2.  10. Obstructive sleep apnea       - PSG 01/05/06 AHI 24.8        - CPAP 9 cm H2O 11. Mild asthma      - PFT 02/05/10 FEV1 1.38(&&%), FEV1% 73, TLC 3.78(86%), DLCO 48%, +BD 12. ACE inhibitor cough 13. Hypothyroidism 14. Hypoxemia likely from Obesity-Hypoventilation syndrome       - SpO2 83% with exertion       - 2 liters oxygen 24/7 15. Pulmonary nodule Rt upper lobe, borderline mediastinal adenopathy      - CT chest June 2011 16. V/Q scan (5/11): low probability of PE.  17. Carotid dopplers (4/11): Mild disease only.   Past Surgical History: Reviewed history from 01/15/2010 and no changes required. Tubal ligation 1973 Right ear surgery 1979 Hysterectomy 1985 Coronary artery bypass grafting 1996 Bilateral cataract removal 2010  Vital Signs:  Patient profile:   75 year old female Height:      62 inches (157.48 cm) Weight:      253.13 pounds (115.06 kg) BMI:     46.47 O2 Sat:      95 % on 2 L/min Temp:     98.2 degrees F (36.78 degrees C) oral Pulse rate:   72 / minute BP sitting:   162 / 94  (right arm) Cuff size:   large  Vitals Entered By: Francesca Jewett CMA (July 20, 2010 3:56 PM)  O2 Sat at Rest %:  95 O2 Flow:  2 L/min CC: OSA...the patient says she is doing fairly well on cpap...she does c/o leakage with her mask and at times the pressure seems too high or forceful Comments Medications reviewed with patient Francesca Jewett CMA  July 20, 2010 3:57 PM   Physical Exam  General:  on supplemental oxygen and obese.   Nose:  no deformity, discharge, inflammation, or lesions Mouth:  MP 4, no exudate Neck:  no JVD.   Lungs:  diminished breath sounds, no wheezing or rales Heart:  regular rhythm, normal rate, and no murmurs.   Extremities:  minimal ankle edema Neurologic:  normal CN II-XII and strength normal.   Cervical Nodes:  no significant adenopathy Psych:  anxious.     Impression & Recommendations:  Problem # 1:  ASTHMA (ICD-493.90) Will have her restart Qvar, and continue as needed proair.  Problem # 2:  PULMONARY  NODULE (ICD-518.89)  Will arrange for f/u CT chest and call her with the results.  Problem # 3:  HYPOXEMIA (ICD-799.02) She continues to have oxygen desaturation.  Explained that she needs to continue supplemental oxygen 24/7.  Problem # 4:  SLEEP APNEA, OBSTRUCTIVE (ICD-327.23)  She will d/w her DME about her mask fit.  Problem # 5:  OVERWEIGHT/OBESITY (ICD-278.02) Again explained how her weight is contributing to her health problems.  Problem # 6:  SHORTNESS OF BREATH (ICD-786.05)  Multifactorial: OHS/OSA, diastolic CHF, asthma, obesity/deconditioning.   Medications Added to Medication List This Visit: 1)  Fluoxetine Hcl 40 Mg Caps (  Fluoxetine hcl) .[name].. 1 by mouth daily 2)  Liothyronine Sodium 5 Mcg Tabs (Liothyronine sodium) .... 2 by mouth daily  Complete Medication List: 1)  Qvar 80 Mcg/act Aers (Beclomethasone dipropionate) .... Two puffs two times a day 2)  Proair Hfa 108 (90 Base) Mcg/act Aers (Albuterol sulfate) .... Two puffs up to four times per day as needed for cough, wheeze, chest congestion, or shortness of breath 3)  Catapres-tts-2 0.2 Mg/24hr Ptwk (Clonidine hcl) .... Apply one weekly 4)  Carvedilol 25 Mg Tabs (Carvedilol) .... Take one tablet by mouth twice a day 5)  Furosemide 40 Mg Tabs (Furosemide) .... 2 by mouth in the am, and 1 by mouth in the pm 6)  Aspirin 81 Mg Tbec (Aspirin) .... Take one tablet by mouth daily 7)  Crestor 40 Mg Tabs (Rosuvastatin calcium) .... Take one tablet by mouth daily. 8)  Glipizide 10 Mg Tabs (Glipizide) .[name].. 1 by mouth two times a day 9)  Lantus 100 Unit/ml Soln (Insulin glargine) .... Use as directed 10)  Calcitriol 0.25 Mcg Caps (Calcitriol) .[name].. 1 tab m, w, f 11)  Fluoxetine Hcl 40 Mg Caps (Fluoxetine hcl) .[name].. 1 by mouth daily 12)  Tandem 162-115.2 Mg Caps (Ferrous fum-iron polysacch) .[name].. 1 tab once daily 13)  Oxygen 2 Liters  .... At bedtime 14)  Cpap  15)  Micardis 80 Mg Tabs (Telmisartan) .... Take 1 tablet by mouth  once a day 16)  Spironolactone 25 Mg Tabs (Spironolactone) .... Take 1/2 tablet daily 17)  Liothyronine Sodium 5 Mcg Tabs (Liothyronine sodium) .... 2 by mouth daily  Other Orders: Est. Patient Level IV [id number]) Radiology Referral (Radiology)  Patient Instructions: 1)  Qvar two puffs two times a day 2)  Proair two puffs up to four times per day as needed 3)  Will schedule CT chest and call with results 4)  Continue supplemental oxygen 24/7 5)  Follow up in 6 months Prescriptions: PROAIR HFA 108 (90 BASE) MCG/ACT AERS (ALBUTEROL SULFATE) two puffs up to four times per day as needed for cough, wheeze, chest congestion, or shortness of breath  #1 x 6   Entered and Authorized by:   Chesley Mires MD   Signed by:   Chesley Mires MD on 07/20/2010   Method used:   Electronically to        [facility] (retail)       [place], [place]  [address]       Ph: [phone]       Fax: [phone]   [id number] QVAR 15 MCG/ACT AERS (BECLOMETHASONE DIPROPIONATE) two puffs two times a day  #1 x 6   Entered and Authorized by:   Chesley Mires MD   Signed by:   Chesley Mires MD on 07/20/2010   Method used:   Electronically to        [facility] (retail)       [place], [place]  [address]       Ph: [phone]       Fax: [phone]   RxID:   [id number]    Immunization History:  Influenza Immunization History:    Influenza:  historical (06/05/2010)   Appended Document: 3-4 months/apc Ambulatory Pulse Oximetry  Resting; HR___71__    02 Sat__90%  on room air___  Lap1 (185 feet)   HR__86___   02 Sat__82% on room air after walking 50 feet___ Lap2 (185 feet)   HR_____   02 Sat_____    Lap3 (185 feet)   HR_____   02 Sat_____  ___Test Completed without Difficulty __x_Test Stopped due to: patients sats recovered to 93% on 2 liters after  resting and pulse was 73.

## 2010-10-06 NOTE — Assessment & Plan Note (Signed)
Summary: F/U 4 MONTHS  Medications Added CATAPRES-TTS-2 0.2 MG/24HR PTWK (CLONIDINE HCL) apply one weekly * OXYGEN 2 LITERS at bedtime * CPAP  MICARDIS 80 MG TABS (TELMISARTAN) Take 1 tablet by mouth once a day SPIRONOLACTONE 25 MG TABS (SPIRONOLACTONE) Take 1/2 tablet daily      Allergies Added:   Visit Type:  Follow-up Primary Provider:  Juluis Pitch, MD  CC:  c/o increased blood pressure. Denies chest pain or shortness of breath..  History of Present Illness: This is a 75 year old with history of coronary artery disease status post CABG, resistant hypertension, renal artery stenosis, morbid obesity, diastolic congestive heart failure, and OHS/OSA presents to Cardiology Clinic for followup.  She is using home oxygne most of the time for OHS and CPAP at night for OSA.  Her BP continues to be poorly controlled.  It is 176/100 today and runs from the 0000000 systolic on her forearm cuff at home.  She was unable to tolerate catapres TTS-3 due to orthostatic-type symptoms (it also fell off her too much).  She can take the TTS-2 patch.  She is taking her Lasix as ordered but weight is up another 6 lbs since I last saw her.  No chest pain.  Stable exertional dyspnea after walking about 50 feet.  She feels somewhat better now that she is using inhalers.   Labs (9/10): creatinine 1.4, HCT 41.7, LDL 68, HDL 42, hgbA1c 8.1% Labs (4/11): BNP 82, LDL 62 Labs (12/23/09): K 4, creatinine 1.24 Labs (6/11): K 4.2, creatinine 1.2, BNP 94 Labs (7/11): LDL 60, HDL 40, LFTs normal Labs (8/11): K 4.2, creatinine 1.25, HCT 40  ECG: NSR, normal  Current Medications (verified): 1)  Qvar 80 Mcg/act Aers (Beclomethasone Dipropionate) .... Two Puffs Two Times A Day 2)  Proair Hfa 108 (90 Base) Mcg/act Aers (Albuterol Sulfate) .... Two Puffs Up To Four Times Per Day As Needed For Cough, Wheeze, Chest Congestion, or Shortness of Breath 3)  Catapres-Tts-2 0.2 Mg/24hr Ptwk (Clonidine Hcl) .... Apply One  Weekly 4)  Carvedilol 25 Mg Tabs (Carvedilol) .... Take One Tablet By Mouth Twice A Day 5)  Diovan 320 Mg Tabs (Valsartan) .[name].. 1 Tab Once Daily 6)  Furosemide 40 Mg Tabs (Furosemide) .... 2 By Mouth in The Am, and 1 By Mouth in The Pm 7)  Aspirin 81 Mg Tbec (Aspirin) .... Take One Tablet By Mouth Daily 8)  Crestor 40 Mg Tabs (Rosuvastatin Calcium) .... Take One Tablet By Mouth Daily. 9)  Glipizide 10 Mg Tabs (Glipizide) .[name].. 1 By Mouth Two Times A Day 10)  Lantus 100 Unit/ml Soln (Insulin Glargine) .... Use As Directed 11)  Synthroid 100 Mcg Tabs (Levothyroxine Sodium) .[name].. 1 Tab Once Daily 12)  Calcitriol 0.25 Mcg Caps (Calcitriol) .[name].. 1 Tab M, W, F 13)  Fluoxetine Hcl 20 Mg Caps (Fluoxetine Hcl) .[name].. 1 Tab Once Daily 14)  Tandem 162-115.2 Mg Caps (Ferrous Fum-Iron Polysacch) .[name].. 1 Tab Once Daily 15)  Oxygen 2 Liters .... At Bedtime 16)  Cpap  Allergies (verified): 1)  ! Norvasc 2)  ! Codeine 3)  ! Clonidine Hcl 4)  ! Ace Inhibitors 5)  * Metformin  Past History:  Past Surgical History: Last updated: 01/15/2010 Tubal ligation 1973 Right ear surgery 1979 Hysterectomy 1985 Coronary artery bypass grafting 1996 Bilateral cataract removal 2010  Family History: Last updated: 01/15/2010 Father - CAD/MI, AAA age 80 Mother - DM, Renal failure age 84 Brother - DM, Renal failure age 30 Sister - DM  Social History: Last updated: 01/15/2010 Married, 3 daughters.  No history of smoking or alcohol use.  Works part time with [employer].  Risk Factors: Alcohol Use: 0 (01/15/2010) Exercise: no (01/29/2009)  Risk Factors: Smoking Status: never (01/15/2010)  Past Medical History: Reviewed history from 03/02/2010 and no changes required. 1. Hyperlipidemia. 2. Resistant hypertension times many years.  The patient does have renal artery stenosis.  She has tried calcium channel blockers in the past and states that she would not take them now because she had some problems with her  gums which her dentist identified as calcium-channel blocker side effects. 3. Type 2 diabetes. 4. Coronary artery disease, status post anterior MI in 1996 followed by coronary artery bypass grafting.  Lexiscan myoview (4/11): EF 67%, normal perfusion with no evidence for ischemia or infarction.  5. Renal artery stenosis.  The patient has an occluded right renal artery and an atrophic right kidney.  There is 20% left renal artery stenosis.  This was seen by catheterization in 2007. 6. History of diastolic congestive heart failure.  Most recent echo (12/10) showed EF 55-60% with mild LVH, grade I diastolic dysfunction, mild LAE.  7. Aortic valve disorder: Echo in 2009 showed mean aortic valve gradient of 13 mmHg, suggesting very mild stenosis.  Echo (12/10) suggested aortic sclerosis only.   8. Obesity. 9.Chronic kidney disease.  Most recent creatinine was 1.2.  10. Obstructive sleep apnea       - PSG 01/05/06 AHI 24.8       - CPAP 9 cm H2O 11. Mild asthma      - PFT 02/05/10 FEV1 1.38(&&%), FEV1% 73, TLC 3.78(86%), DLCO 48%, +BD 12. ACE inhibitor cough 13. Hypothyroidism 14. Hypoxemia likely from Obesity-Hypoventilation syndrome       - SpO2 83% with exertion       - 2 liters oxygen 24/7 15. Pulmonary nodule Rt upper lobe, borderline mediastinal adenopathy      - CT chest June 2011 16. V/Q scan (5/11): low probability of PE.  17. Carotid dopplers (4/11): Mild disease only.   Family History: Reviewed history from 01/15/2010 and no changes required. Father - CAD/MI, AAA age 63 Mother - DM, Renal failure age 102 Brother - DM, Renal failure age 58 Sister - DM  Social History: Reviewed history from 01/15/2010 and no changes required. Married, 3 daughters.  No history of smoking or alcohol use.  Works part time with [employer].  Review of Systems       All systems reviewed and negative except as per HPI.   Vital Signs:  Patient profile:   75 year old female Height:      62  inches Weight:      260 pounds BMI:     47.73 Pulse rate:   69 / minute BP sitting:   176 / 100  (left arm) Cuff size:   large  Vitals Entered By: Dolores Lory, CMA (July 13, 2010 10:06 AM)  Physical Exam  General:  Well developed, well nourished, in no acute distress. Obese.  Neck:  Neck supple, no JVD. No masses, thyromegaly or abnormal cervical nodes. Lungs:  Clear bilaterally to auscultation and percussion. Heart:  Non-displaced PMI, chest non-tender; regular rate and rhythm, S1, S2 without rubs or gallops. 1/6 early systolic ejection-type murmur RUSB.  Carotid upstroke normal, no bruit.  Pedals normal pulses. 1+ ankle edema.  Abdomen:  Bowel sounds positive; abdomen soft and non-tender without masses, organomegaly, or hernias noted. No hepatosplenomegaly.  Extremities:  No clubbing or cyanosis. Neurologic:  Alert and oriented x 3. Psych:  Normal affect.   Impression & Recommendations:  Problem # 1:  CAD, ARTERY BYPASS GRAFT (ICD-414.04) Recent normal myoview.  Continue ASA, Crestor, Coreg, ARB.    Problem # 2:  HYPERTENSION, UNSPECIFIED (ICD-401.9) BP high in the office today and very high at times at home (though forearm cuff may be somewhat inaccurate).  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  I am going to stop losartan and change her over to telmisartan 80 mg daily for more complete 24 hour coverage.  I am also going to carefully start her on spironolactone 12.5 mg daily for resistant hypertension (? aldosterone dependent).  Will get BP check and BMET in 10 days.  Next step after increasing spironolactone to 25 mg daily would be hydralazine.  She is using her CPAP every night.   Problem # 3:  SHORTNESS OF BREATH (ICD-786.05) Multifactorial: OHS/OSA, diastolic CHF, asthma, obesity/deconditioning.   Problem # 4:  DIASTOLIC HEART FAILURE, CHRONIC (ICD-428.32) Stable multifactorial dyspnea, as above.  Needs  better BP control.  Volume appears stable, close to euvolemic.  Continue current dose of Lasix.  Creatinine 1.25 in 8/11. Will get BMET after starting spironolactone and changing to telmisartan.   Problem # 5:  OVERWEIGHT/OBESITY (ICD-278.02) Obesity seems to be the root of her problems: OHS/OSA, HTN, poorly-controlled diabetes, limiting shortness of breath.  She is still gaining weight.  She is not able to exercise much due to her dyspnea.  There is a vicious cycle here.  I would like to have her evaluated by the bariatric program at [facility].  Would she be a candidate for lap banding? I will refer her.   Problem # 6:  HYPERLIPIDEMIA-MIXED (B2193296.4) Excellent lipids in 7/11.  Repeat 1/11 (goal LDL < 70).   Patient Instructions: 1)  Your physician recommends that you schedule a follow-up appointment in: 2 months 2)  Your physician recommends that you return for a FASTING lipid profile: in January 2012 (Lipid/LFT) 3)  Your physician recommends that you return for lab work in: 10 days BMP (428.32) 4)  Your physician has recommended you make the following change in your medication: Stop Diovan, Start taking Micardis 80mg  once daily, and Spironolactone 25mg  1/2 tablet once daily. 5)  Your physician has requested that you regularly monitor and record your blood pressure readings at home.  Please use the same machine at the same time of day to check your readings and record them bring these readings to your next lab appt.  6)  You have been referred to the Bariatric Surgery clinic at [facility].  Pt was given brochure and information on how to enroll in program.  Prescriptions: SPIRONOLACTONE 25 MG TABS (SPIRONOLACTONE) Take 1/2 tablet daily  #15 x 6   Entered by:   Freddrick March RN   Authorized by:   [name], MD   Signed by:   Freddrick March RN on 07/13/2010   Method used:   Electronically to        [facility] (retail)       [place], [place]  [address]       Ph: [phone]       Fax: [phone]   RxID:   [id number] MICARDIS 80 MG TABS (TELMISARTAN)  Take 1 tablet by mouth once a day  #30 x 6   Entered by:   Freddrick March RN   Authorized by:   [name], MD   Signed by:   Freddrick March RN on 07/13/2010   Method used:   Electronically to        [facility] (retail)       [place], [place]  [address]       Ph: [phone]       Fax: [phone]   RxID:   [id number]

## 2010-10-06 NOTE — Assessment & Plan Note (Signed)
Summary: SLEEP APNEA/ CPAP TITRATION///kp   Copy to:  Dawn Bradford, Dawn Bradford Primary Provider/Referring Provider:  Juluis Pitch, MD  CC:  Sleep consult. Epworth score is 5. Pt is currently on CPAP. Study done in 2008 or 2009 in [place].[name]  History of Present Illness: 75 yo female with sleep apnea and dyspnea.  With regard to her sleep apnea she had a sleep test done several years ago, and was told she has severe sleep apnea.  She has been using CPAP with a full face mask, and has a humidifer.  She does not feel like she is getting enough pressure from the machine, and she gets soreness around her nose from her mask.  She has tried tightening the mask straps, but this has not helped.   She goes to bed between 7pm and 10pm.  She can usually fall asleep, but then wakes up frequently.  She gets hunger and eats during the night.  She says this is a side effect of her insulin.  She will lay awake thinking about all her problems, and has trouble falling back to sleep.  She will also watch TV for a while.  She uses the bathroom a couple times per night.  She gets out of bed at 6 am.  She feels tired and sleepy all the time.  She will nap in the afternoon when she cans, and uses her CPAP machine.  This helps some.  She has more trouble sleeping on her back, and will occasionally get headaches in the morning.  She will take tylenol PM during the night when she has trouble falling back to sleep, but she is not sure this helps much.  She has gained about 20 lbs over the past year.  She denies sleep walking, sleep talking, bruxism, or nightmares.  She gets frequent fellings in her legs, and this sometimes can cause trouble falling asleep.  She denies sleep hallucinations, sleep paralysis, or cataplexy.  She is not using anything to help her stay awake.  She does get anxious easily, and feels depressed about her multitude of health problems.She has been having trouble with her breathing for  sometime.  This has been getting worse.  She does not have cough, wheeze, sputum, chest congestion, or hemoptysis.  There is no history of smoking or asthma.  She gets winded walking from her couch to the kitchen.  She also gets winded when she has to bend over.  There is no prior history of pneumonia or TB.  She denies occupational exposure.  She does have a history of cardiovascular disease with hypertension and diastolic dysfunction.  There is no history of thrombo-embolic disease.  Her daughter reports that Dawn Bradford does very little activity.  The patient reports being scared of what may happen if she exerts herself too much.  Preventive Screening-Counseling & Management  Alcohol-Tobacco     Alcohol drinks/day: 0     Smoking Status: never  Current Medications (verified): 1)  Furosemide 40 Mg Tabs (Furosemide) .... 2 By Mouth in The Am, and 1 By Mouth in The Pm 2)  Glipizide 10 Mg Tabs (Glipizide) .[name].. 1 By Mouth Two Times A Day 3)  Calcitriol 0.25 Mcg Caps (Calcitriol) .[name].. 1 Tab M, W, F 4)  Synthroid 100 Mcg Tabs (Levothyroxine Sodium) .[name].. 1 Tab Once Daily 5)  Tandem 162-115.2 Mg Caps (Ferrous Fum-Iron Polysacch) .[name].. 1 Tab Once Daily 6)  Diovan 320 Mg Tabs (Valsartan) .[name].. 1 Tab Once Daily 7)  Fluoxetine Hcl  20 Mg Caps (Fluoxetine Hcl) .[name].. 1 Tab Once Daily 8)  Lantus 100 Unit/ml Soln (Insulin Glargine) .... Use As Directed 9)  Aspirin 81 Mg Tbec (Aspirin) .... Take One Tablet By Mouth Daily 10)  Carvedilol 25 Mg Tabs (Carvedilol) .... Take One Tablet By Mouth Twice A Day 11)  Crestor 40 Mg Tabs (Rosuvastatin Calcium) .... Take One Tablet By Mouth Daily. 12)  Catapres-Tts-2 0.2 Mg/24hr Ptwk (Clonidine Hcl) .... As Directed  Allergies (verified): 1)  ! Norvasc 2)  ! Codeine 3)  ! Clonidine Hcl 4)  ! Ace Inhibitors  Past History:  Past Medical History: 1. Hyperlipidemia. 2. Resistant hypertension times many years.  The patient does have renal artery stenosis.  She has tried  calcium channel blockers in the past and states that she would not take them now because she had some problems with her gums which her dentist identified as calcium-channel blocker side effects. 3. Type 2 diabetes. 4. Coronary artery disease, status post anterior MI in 1996 followed by coronary artery bypass grafting.  The patient's most recent Myoview was done in October 2008, which showed an EF of 77% with no perfusion defects. 5. Renal artery stenosis.  The patient has an occluded right renal artery and an atrophic right kidney.  There is 20% left renal artery stenosis.  This was seen by catheterization in 2007. 6. History of diastolic congestive heart failure.  Most recent echo (12/10) showed EF 55-60% with mild LVH, grade I diastolic dysfunction, mild LAE.  7. Aortic valve disorder: Echo in 2009 showed mean aortic valve gradient of 13 mmHg, suggesting very mild stenosis.  Echo (12/10) suggested aortic sclerosis only.   8. Obesity. 9.Chronic kidney disease.  Most recent creatinine was from February 2010 and was 1.49.  10. Obstructive sleep apnea 11. Chronic dyspnea 12. ACE inhibitor cough 13. Hypothyroidism  Past Surgical History: Tubal ligation 1973 Right ear surgery 1979 Hysterectomy 1985 Coronary artery bypass grafting 1996 Bilateral cataract removal 2010  Family History: Reviewed history from 01/29/2009 and no changes required. Father - CAD/MI, AAA age 37 Mother - DM, Renal failure age 57 Brother - DM, Renal failure age 72 Sister - DM  Social History: Reviewed history from 06/01/2009 and no changes required. Married, 3 daughters.  No history of smoking or alcohol use.  Works part time with [employer].Alcohol drinks/day:  0  Review of Systems       The patient complains of shortness of breath with activity, shortness of breath at rest, nasal congestion/difficulty breathing through nose, sneezing, anxiety, depression, hand/feet swelling, and joint stiffness or pain.  The  patient denies productive cough, non-productive cough, coughing up blood, chest pain, irregular heartbeats, acid heartburn, indigestion, loss of appetite, weight change, abdominal pain, difficulty swallowing, sore throat, tooth/dental problems, headaches, itching, ear ache, rash, change in color of mucus, and fever.    Vital Signs:  Patient profile:   75 year old female Height:      62 inches (157.48 cm) Weight:      252 pounds (114.55 kg) BMI:     46.26 O2 Sat:      91 % on Room air Temp:     97.5 degrees F (36.39 degrees C) oral Pulse rate:   72 / minute BP sitting:   140 / 80  (right arm) Cuff size:   large  Vitals Entered By: Francesca Jewett CMA (Jan 15, 2010 10:12 AM)  O2 Sat at Rest %:  91 O2 Flow:  Room air CC:  Sleep consult. Epworth score is 5. Pt is currently on CPAP. Study done in 2008 or 2009 in [place]. Is Patient Diabetic? Yes Comments Medications reviewed. Francesca Jewett CMA  Jan 15, 2010 10:13 AM   Physical Exam  General:  obese.   Eyes:  PERRLA and EOMI.   Nose:  no deformity, discharge, inflammation, or lesions Mouth:  MP 4, no exudate Neck:  no JVD.   Chest Wall:  no deformities noted Lungs:  diminished breath sounds, no wheezing or rales Heart:  regular rhythm, normal rate, and no murmurs.   Abdomen:  obese, soft, non-tender Msk:  normal gait.   Pulses:  pulses normal Extremities:  1+ ankle edema Neurologic:  normal CN II-XII and strength normal.   Cervical Nodes:  no significant adenopathy Psych:  depressed affect and anxious.     Impression & Recommendations:  Problem # 1:  SLEEP APNEA, OBSTRUCTIVE (ICD-327.23) She has prior diagnosis of OSA.  She has been on CPAP 10 cm.  She has gained weight, and with that has noticed her sleep apnea has gotten worse.  I explained to her how sleep apnea can affect her health.  Driving precautions were discussed.  I explained how her weight is contributing to her sleep apnea.  We discussed proper mask fit.  I will  arrange for an auto-CPAP titration to determine if she is on the optimal pressure setting for her sleep apnea.  get labs from renal  Problem # 2:  INSOMNIA (ICD-780.52) I reviewed realistic expectation for her sleep.  Discussed stimulus control, relaxation, and sleep restriction techniques.  Advised that it is okay to continue with tylenol pm as needed for now, but that this is likely not doing much for her sleep.  Once her sleep apnea and sleeping patterns are better controlled, will then work on weaning her off sleep aides.  Problem # 3:  RESTLESS LEG SYNDROME (ICD-333.94) She has symptoms suggestive of restless legs.  She is already on iron supplementation.  I will monitor her symptoms for now, and re-assess whether she needs specific therapy for her legs after her sleep apnea and insomnia are better controlled.  Problem # 4:  INADEQUATE SLEEP HYGIENE (ICD-307.49) I reviewed proper sleep hygiene technique.  Problem # 5:  DEPRESSION (ICD-311) I am concerned that her depression is contributing to her sleep problems.  Advised her to discuss further with her primary physician.  Problem # 6:  SHORTNESS OF BREATH (ICD-786.05) This is likely multifactorial.  My suspicion is that her dyspnea is mostly related to diastolic heart failure and obesity with deconditioning.  To exclude other underlaying pulmonary disorders I have scheduled her for chest xray with V/Q scan, and PFT.  Depending on the results of these tests she may also need cardio-pulmonary exercise testing.  Problem # 7:  DIASTOLIC HEART FAILURE, CHRONIC (ICD-428.32) She is being followed by cardiology and nephrology for her blood pressure.  Problem # 8:  OVERWEIGHT/OBESITY (ICD-278.02) I have explained to her that the majority of her health problems are likey related to her weight and deconditioning.  Will complete her pulmonary evaluation, and then determine if she has any contra-indications to starting a gradual exercise  program.  Medications Added to Medication List This Visit: 1)  Glipizide 10 Mg Tabs (Glipizide) .[name].. 1 by mouth two times a day  Complete Medication List: 1)  Catapres-tts-2 0.2 Mg/24hr Ptwk (Clonidine hcl) .... As directed 2)  Carvedilol 25 Mg Tabs (Carvedilol) .... Take one tablet by mouth twice a  day 3)  Diovan 320 Mg Tabs (Valsartan) .[name].. 1 tab once daily 4)  Furosemide 40 Mg Tabs (Furosemide) .... 2 by mouth in the am, and 1 by mouth in the pm 5)  Aspirin 81 Mg Tbec (Aspirin) .... Take one tablet by mouth daily 6)  Crestor 40 Mg Tabs (Rosuvastatin calcium) .... Take one tablet by mouth daily. 7)  Glipizide 10 Mg Tabs (Glipizide) .[name].. 1 by mouth two times a day 8)  Lantus 100 Unit/ml Soln (Insulin glargine) .... Use as directed 9)  Synthroid 100 Mcg Tabs (Levothyroxine sodium) .[name].. 1 tab once daily 10)  Calcitriol 0.25 Mcg Caps (Calcitriol) .[name].. 1 tab m, w, f 11)  Fluoxetine Hcl 20 Mg Caps (Fluoxetine hcl) .[name].. 1 tab once daily 12)  Tandem 162-115.2 Mg Caps (Ferrous fum-iron polysacch) .[name].. 1 tab once daily  Other Orders: Consultation Level V [id number]) Full Pulmonary Function Test (PFT) Radiology Referral (Radiology) DME Referral (DME)  Patient Instructions: 1)  Will schedule breathing test (PFT) 2)  Will schedule chest xray and V/Q scan 3)  Will check CPAP pressure 4)  Stick to a regular sleep and wake schedule 5)  Follow up in 2 to 3 weeks

## 2010-10-06 NOTE — Letter (Signed)
Summary: [facility]  [facility] for [facility]   Imported By: Phillis Knack 02/15/2010 13:27:04  _____________________________________________________________________  External Attachment:    Type:   Image     Comment:   External Document

## 2010-10-06 NOTE — Assessment & Plan Note (Signed)
Summary: rov 2-3 wks ///kp   Copy to:  [name], [name] Primary Provider/Referring Provider:  Juluis Pitch, MD  CC:  rov 2-3 weeks.  History of Present Illness: 75 yo female with OSA, RLS, Hypoxemia likely from OSA/OHS and Dyspnea likely from asthma, small airway disease, deconditioning, and diastolic dysfx.  V/Q scan from Jan 26, 2010: very low probability for PE.  Her auto CPAP report is from February 09, 2010:  Used on 14 of 14 night with average 9hr 15 min per night.  Optimal pressure 10 cm H2O with average AHI 5.7.  PFT from February 05, 2010:  Small airway obstruction.  Positive bronchodilator response.  Normal lung volumes.  Moderate diffusion defect.  Lab results from June 3 showed mild elevation in ESR and RF.  Otherwise unremarkable.  CT chest from February 08, 2010 showed borderline mediastinal lymph nodes with some calcification, and 5 mm Rt upper lobe nodule, and dependent atelectasis.  She has been sleeping better, and able to tolerate CPAP better since she had her pressure adjusted.  She still feels like the pressure is too much.  She has been getting difficulties with her mask.  She has air leaking still from the sides.  She had dental work recently, and has been experiencing tooth sensitivity.  She is not sure if the CPAP airflow is contributing to this.  She continues to get winded with exertion.  She has not been using her oxygen all the time.  She is afraid of using it, and is not sure how to turn it on.  She has been using proair once or twice per day.  She is not sure if this has helped much.     CT of Chest  Procedure date:  02/08/2010  Findings:      CT CHEST WITHOUT CONTRAST   Technique:  Multidetector CT imaging of the chest was performed following the standard protocol without IV contrast.   Comparison: Chest x-ray 02/05/2010   Findings: Mediastinal lymph nodes measure up to 1.3 cm in short axis, anterior to the right mainstem bronchus.   Calcification is seen in a subcarinal lymph node, which measures 1.8 cm.  Hilar regions are difficult to definitively evaluate without IV contrast. No axillary adenopathy.  Heart is mildly enlarged.  No pericardial effusion.   There is a 5 mm right upper lobe nodule in the central right upper lobe (image 19).  Dependent atelectasis and/or scarring bilaterally.  No pleural fluid.  Airway is unremarkable. No air trapping on inspiratory and expiratory imaging.  Incidental imaging of the upper abdomen shows no acute findings.  No worrisome lytic or sclerotic lesions.  Advanced endplate degenerative changes are seen in the mid and lower thoracic spine.   IMPRESSION:   1.  No findings to explain the patient's given symptoms. 2.  Small to borderline enlarged mediastinal lymph nodes. 3.  Small right upper lobe nodule.   Current Medications (verified): 1)  Proair Hfa 108 (90 Base) Mcg/act Aers (Albuterol Sulfate) .... Two Puffs Up To Four Times Per Day As Needed For Cough, Wheeze, Chest Congestion, or Shortness of Breath 2)  Catapres-Tts-2 0.2 Mg/24hr Ptwk (Clonidine Hcl) .... As Directed 3)  Carvedilol 25 Mg Tabs (Carvedilol) .... Take One Tablet By Mouth Twice A Day 4)  Diovan 320 Mg Tabs (Valsartan) .[name].. 1 Tab Once Daily 5)  Furosemide 40 Mg Tabs (Furosemide) .... 2 By Mouth in The Am, and 1 By Mouth in The Pm 6)  Aspirin 81 Mg  Tbec (Aspirin) .... Take One Tablet By Mouth Daily 7)  Crestor 40 Mg Tabs (Rosuvastatin Calcium) .... Take One Tablet By Mouth Daily. 8)  Glipizide 10 Mg Tabs (Glipizide) .[name].. 1 By Mouth Two Times A Day 9)  Lantus 100 Unit/ml Soln (Insulin Glargine) .... Use As Directed 10)  Synthroid 100 Mcg Tabs (Levothyroxine Sodium) .[name].. 1 Tab Once Daily 11)  Calcitriol 0.25 Mcg Caps (Calcitriol) .[name].. 1 Tab M, W, F 12)  Fluoxetine Hcl 20 Mg Caps (Fluoxetine Hcl) .[name].. 1 Tab Once Daily 13)  Tandem 162-115.2 Mg Caps (Ferrous Fum-Iron Polysacch) .[name].. 1 Tab Once Daily  Allergies  (verified): 1)  ! Norvasc 2)  ! Codeine 3)  ! Clonidine Hcl 4)  ! Ace Inhibitors 5)  * Metformin  Past History:  Past Medical History: 1. Hyperlipidemia. 2. Resistant hypertension times many years.  The patient does have renal artery stenosis.  She has tried calcium channel blockers in the past and states that she would not take them now because she had some problems with her gums which her dentist identified as calcium-channel blocker side effects. 3. Type 2 diabetes. 4. Coronary artery disease, status post anterior MI in 1996 followed by coronary artery bypass grafting.  The patient's most recent Myoview was done in October 2008, which showed an EF of 77% with no perfusion defects. 5. Renal artery stenosis.  The patient has an occluded right renal artery and an atrophic right kidney.  There is 20% left renal artery stenosis.  This was seen by catheterization in 2007. 6. History of diastolic congestive heart failure.  Most recent echo (12/10) showed EF 55-60% with mild LVH, grade I diastolic dysfunction, mild LAE.  7. Aortic valve disorder: Echo in 2009 showed mean aortic valve gradient of 13 mmHg, suggesting very mild stenosis.  Echo (12/10) suggested aortic sclerosis only.   8. Obesity. 9.Chronic kidney disease.  Most recent creatinine was from February 2010 and was 1.49.  10. Obstructive sleep apnea       - PSG 01/05/06 AHI 24.8       - CPAP 9 cm H2O 11. Chronic dyspnea      - PFT 02/05/10 FEV1 1.38(&&%), FEV1% 73, TLC 3.78(86%), DLCO 48%, +BD 12. ACE inhibitor cough 13. Hypothyroidism 14. Hypoxemia likely from Obesity-Hypoventilation syndrome       - SpO2 83% with exertion       - 2 liters oxygen 24/7 15. Pulmonary nodule Rt upper lobe, borderline mediastinal adenopathy      - CT chest June 2011  Vital Signs:  Patient profile:   75 year old female Height:      62 inches Weight:      254 pounds O2 Sat:      88 % on Room air Temp:     98.2 degrees F oral Pulse rate:   70 /  minute BP sitting:   168 / 98  (left arm) Cuff size:   large  Vitals Entered By: Lyndee Leo, CMA (February 24, 2010 11:26 AM)  O2 Sat at Rest %:  88% O2 Flow:  Room air  O2 Sat Comments patient 's oxygen level elevated via room air approx 2 minuted upon entering room to 98%ra Lyndee Leo, CMA  February 24, 2010 11:29 AM   Reason for Visit rov 2-3  weeks, patient states she needs instructions on how to use oxygen again.  pt states she uses Cpap approx 6-7 hrs per night and usually sleeps only 4-5, she lays  awake. her main concern is the overwhelming of the cpap pressure.   Impression & Recommendations:  Problem # 1:  SHORTNESS OF BREATH (ICD-786.05) This is multifactorial.  Likely related to small airway disease and possible asthma, chronic hypoxemia, diastolic dysfunction, and deconditioning.  Evaluation for lung parenchymal disease and thrombo-embolic disease was negative.  Will start her on Qvar and continue as needed proair to see if this can improve her small airway disease and possible asthma.  She is followed by cardiology for her hypertension.  I have advised her to start a gradual exercise regimen to build up her exercise tolerance.    I offered a cardiopulmonary stress test to further assess her dyspnea.  I explained that this would not likely add further information, and she is in agreement with deferring this test for now.  Problem # 2:  SLEEP APNEA, OBSTRUCTIVE (ICD-327.23) She has been sleep better, and feeling more energetic during the day, since her CPAP was adjusted.  She feels there is too much pressure from her machine, however, and she is having trouble with her mask.  She has also been getting nasal congestion.  I will drop her pressure from 10 to 9 cm H2O.  I will have her DME refit her CPAP mask.  I have advised her to adjust her humidifer temperature, and to try using saline nasal spray.  Problem # 3:  HYPOXEMIA (ICD-799.02) This is likely on the  basis of obesity hypoventilation syndrome.  She is to continue with supplemental oxygen at 2 liters 24/7.  I have encouraged her to start a weight loss regimen.  I showed her how to use her oxygen, and will arrange for her DME to review this further.  Problem # 4:  RESTLESS LEG SYNDROME (ICD-333.94) Will defer further assessment of this for now.  Problem # 5:  PULMONARY NODULE (ICD-518.89) She has 5 mm Rt upper lobe nodule, and borderline mediastinal adenopathy.  She will need follow up CT chest w/o contrast (due to renal dysfunction) in December 2011.  Medications Added to Medication List This Visit: 1)  Qvar 80 Mcg/act Aers (Beclomethasone dipropionate) .... Two puffs two times a day  Complete Medication List: 1)  Qvar 80 Mcg/act Aers (Beclomethasone dipropionate) .... Two puffs two times a day 2)  Proair Hfa 108 (90 Base) Mcg/act Aers (Albuterol sulfate) .... Two puffs up to four times per day as needed for cough, wheeze, chest congestion, or shortness of breath 3)  Catapres-tts-2 0.2 Mg/24hr Ptwk (Clonidine hcl) .... As directed 4)  Carvedilol 25 Mg Tabs (Carvedilol) .... Take one tablet by mouth twice a day 5)  Diovan 320 Mg Tabs (Valsartan) .[name].. 1 tab once daily 6)  Furosemide 40 Mg Tabs (Furosemide) .... 2 by mouth in the am, and 1 by mouth in the pm 7)  Aspirin 81 Mg Tbec (Aspirin) .... Take one tablet by mouth daily 8)  Crestor 40 Mg Tabs (Rosuvastatin calcium) .... Take one tablet by mouth daily. 9)  Glipizide 10 Mg Tabs (Glipizide) .[name].. 1 by mouth two times a day 10)  Lantus 100 Unit/ml Soln (Insulin glargine) .... Use as directed 11)  Synthroid 100 Mcg Tabs (Levothyroxine sodium) .[name].. 1 tab once daily 12)  Calcitriol 0.25 Mcg Caps (Calcitriol) .[name].. 1 tab m, w, f 13)  Fluoxetine Hcl 20 Mg Caps (Fluoxetine hcl) .[name].. 1 tab once daily 14)  Tandem 162-115.2 Mg Caps (Ferrous fum-iron polysacch) .[name].. 1 tab once daily  Other Orders: Est. Patient Level IV [id number]) DME  Referral  (DME)  Patient Instructions: 1)  Qvar two puffs two times a day, and rinse mouth after using 2)  Proair two puffs up to four times per day as needed for cough, wheeze, chest congestion, or shortness of breath 3)  Will arrange for new CPAP mask 4)  Try using saline rinse for your sinuses 5)  Try adjusting the humidifer temperature on your CPAP machine 6)  You need to use your oxygen 24 hours per day 7)  Folllow up in 4 to 6 weeks Prescriptions: QVAR 80 MCG/ACT AERS (BECLOMETHASONE DIPROPIONATE) two puffs two times a day  #1 x 3   Entered and Authorized by:   Chesley Mires MD   Signed by:   Chesley Mires MD on 02/24/2010   Method used:   Electronically to        [facility] (retail)       [place], [place]  [address]       Ph: [phone]       Fax: [phone]   RxID:   [id number]    Immunization History:  Influenza Immunization History:    Influenza:  fluvax 3+ (07/06/2009)  Pneumovax Immunization History:    Pneumovax:  pneumovax (07/06/2005)

## 2010-10-06 NOTE — Letter (Signed)
Summary: [employer]   Imported By: Bubba Hales 02/12/2010 08:04:32  _____________________________________________________________________  External Attachment:    Type:   Image     Comment:   External Document

## 2010-10-06 NOTE — Letter (Signed)
Summary: [facility]  [facility]   Imported By: Phillis Knack 07/28/2010 09:21:11  _____________________________________________________________________  External Attachment:    Type:   Image     Comment:   External Document

## 2010-10-06 NOTE — Progress Notes (Signed)
Summary: Elevated BP readings  Phone Note Call from Patient   Caller: Patient Call For: [facility] Summary of Call: Pt came into office today for a BMP, brought BP readings with her 11/15 am 243/120 HR 76 pm 186/82 HR 64, 11/16 am 238/91 HR 68 pm 211/103 HR 72, 11/17 am 196/87 HR 70 pm 162/82 HR 72, 11/18 am 234/102 HR 71 these readings were all from a digital wrist BP cuff.  Checked pt's BP today 11/18 while in office 140/90.  Please advise. Initial call taken by: Freddrick March RN,  July 23, 2010 11:28 AM     Appended Document: Elevated BP readings She needs a different cuff, it sounds like the cuff may not be accurate.  Would have her either get a new cuff or check BP at drug store.  BP still high regardless so would have her get hydralazine 25 mg three times a day and go ahead and start it.   Appended Document: Elevated BP readings Attempted to call pt with recommendations.  LMOM TCB. EWJ  Appended Document: Elevated BP readings Attempted to call pt with Dr Claris Gladden recommendations.  LMOM TCB. EWJ  Appended Document: Elevated BP readings Called spoke with pt made aware of Dr Claris Gladden recommendations to monitor BP on new machine either purchased or at the drug store and to start Hydralazine 25mg  three times a day.  Rx sent to [facility]   Clinical Lists Changes  Medications: Added new medication of HYDRALAZINE HCL 25 MG TABS (HYDRALAZINE HCL) Take one tablet by mouth three times a day - Signed Rx of HYDRALAZINE HCL 25 MG TABS (HYDRALAZINE HCL) Take one tablet by mouth three times a day;  #90 x 6;  Signed;  Entered by: Freddrick March RN;  Authorized by: [name], MD;  Method used: Electronically to [facility] [address], [place], [place], [place]  [address], Ph: [phone], Fax: [phone]    Prescriptions: HYDRALAZINE HCL 25 MG TABS (HYDRALAZINE HCL) Take one tablet by mouth three times a day  #90 x 6   Entered  by:   Freddrick March RN   Authorized by:   [name], MD   Signed by:   Freddrick March RN on 07/26/2010   Method used:   Electronically to        [facility] (retail)       [place], [place]  [address]       Ph: [phone]       Fax: [phone]   RxID:   [id number]

## 2010-10-06 NOTE — Assessment & Plan Note (Signed)
Summary: Cardiology Nuclear Study  Nuclear Med Background Indications for Stress Test: Evaluation for Ischemia, Graft Patency   History: CABG, Echo, Myocardial Infarction, Myocardial Perfusion Study  History Comments: '96 MI - CABG 10/08 MPS NL EF 77% 12/10 ECHO EF 0000000  Diastolic Dysfunction--CHF Family H/O AAA  Symptoms: Chest Tightness, DOE, SOB    Nuclear Pre-Procedure Cardiac Risk Factors: Hypertension, Lipids, Obesity, PVD Caffeine/Decaff Intake: None NPO After: 8:00 AM IV 0.9% NS with Angio Cath: 24g     IV Site: (R) Hand IV Started by: Irven Baltimore RN Chest Size (in) 40     Cup Size C     Height (in): 61 Weight (lb): 248 BMI: 47.03 Tech Comments: Held coreg x 24 hrs., 5 1/2 HR PC BS =135 here. Patsy Edwards,RN.  Nuclear Med Study 1 or 2 day study:  2 day     Stress Test Type:  Carlton Adam Reading MD:  Dola Argyle, MD     Referring MD:  D.McLean Resting Radionuclide:  Technetium 20m Tetrofosmin     Resting Radionuclide Dose:  33 mCi  Stress Radionuclide:  Technetium 28m Tetrofosmin     Stress Radionuclide Dose:  33 mCi   Stress Protocol   Lexiscan: 0.4 mg   Stress Test Technologist:  Perrin Maltese EMT-P     Nuclear Technologist:  Charlton Amor CNMT  Rest Procedure  Myocardial perfusion imaging was performed at rest 45 minutes following the intravenous administration of Myoview Technetium 58m Tetrofosmin.  Stress Procedure  The patient received IV Lexiscan 0.4 mg over 15-seconds.  Myoview injected at 30-seconds.  There were no significant changes, headache, and sob with infusion.  Quantitative spect images were obtained after a 45 minute delay.  QPS Raw Data Images:  Normal; no motion artifact; normal heart/lung ratio. Stress Images:  There is normal uptake in all areas. Rest Images:  Normal homogeneous uptake in all areas of the myocardium. Subtraction (SDS):  No evidence of ischemia. Transient Ischemic Dilatation:  1.11  (Normal <1.22)  Lung/Heart  Ratio:  .25  (Normal <0.45)  Quantitative Gated Spect Images QGS EDV:  78 ml QGS ESV:  26 ml QGS EF:  67 % QGS cine images:  Normal wall motion  Findings Normal nuclear study      Overall Impression  Exercise Capacity: Lexiscan BP Response: Normal blood pressure response. Clinical Symptoms: SOB ECG Impression: No significant ST segment change suggestive of ischemia. Overall Impression: Normal stress nuclear study.  Appended Document: Cardiology Nuclear Study normal stress test.   Appended Document: Cardiology Nuclear Study normal stress test  Appended Document: Cardiology Nuclear Study spoke with pt aware normal stress test.  Darnelle Spangle

## 2010-10-06 NOTE — Assessment & Plan Note (Signed)
Summary: F1W/AMD  Medications Added FUROSEMIDE 40 MG TABS (FUROSEMIDE) 2 by mouth in the am, and 1 by mouth in the pm      Allergies Added:   Visit Type:  Follow-up Primary Provider:  Juluis Pitch, MD  CC:  Patient is having SOB., slight edema in both ankle and feet, and No complaint of pain in chest but tingling mandible area..  History of Present Illness: This is a 75 year old with history of coronary artery disease status post CABG, resistant hypertension, renal artery stenosis, morbid obesity, and diastolic congestive heart failure presents to Cardiology Clinic for followup.  When I saw the patient last a couple weeks ago, she had been more short of breath than before for the last few months.  She had gained 14 lbs since the prior appointment and said that she had been eating more.  She was short of breath simply walking in her house or after walking about 50 feet outside.  No orthopnea if she uses her CPAP.  BP was 160/88.  She had an episode of chest tightness about 6 months ago while sitting in the chair at the beauty shop. No further CP since that time. Finally, her eye doctor saw some plaque on retinal exam and was concerned that she could have carotid disease with microemboli to the retinal arteries.    I increased her Lasix at that time and set her up for carotid dopplers and a myoview.  Unfortunately, she has not had the tests done yet.  She has lost about 2 lbs on increased Lasix and is feeling less short of breath.  I also restarted her clonidine patch and  BP is better (142/82 today).  She wants to start a holistic vitamin regimen.    Labs (9/10): creatinine 1.4, HCT 41.7, LDL 68, HDL 42, hgbA1c 8.1% Labs (4/11): BNP 82, LDL 62 Labs (12/23/09): K 4, creatinine 1.24  Current Medications (verified): 1)  Furosemide 40 Mg Tabs (Furosemide) .... 2 By Mouth in The Am, and 1 By Mouth in The Pm 2)  Glipizide 10 Mg Tabs (Glipizide) .... 2 Tab Once Daily 3)  Calcitriol 0.25 Mcg Caps  (Calcitriol) .[name].. 1 Tab M, W, F 4)  Synthroid 100 Mcg Tabs (Levothyroxine Sodium) .[name].. 1 Tab Once Daily 5)  Tandem 162-115.2 Mg Caps (Ferrous Fum-Iron Polysacch) .[name].. 1 Tab Once Daily 6)  Diovan 320 Mg Tabs (Valsartan) .[name].. 1 Tab Once Daily 7)  Fluoxetine Hcl 20 Mg Caps (Fluoxetine Hcl) .[name].. 1 Tab Once Daily 8)  Lantus 100 Unit/ml Soln (Insulin Glargine) .... Use As Directed 9)  Aspirin 81 Mg Tbec (Aspirin) .... Take One Tablet By Mouth Daily 10)  Carvedilol 25 Mg Tabs (Carvedilol) .... Take One Tablet By Mouth Twice A Day 11)  Crestor 40 Mg Tabs (Rosuvastatin Calcium) .... Take One Tablet By Mouth Daily. 12)  Catapres-Tts-2 0.2 Mg/24hr Ptwk (Clonidine Hcl) .... As Directed  Allergies (verified): 1)  ! Norvasc 2)  ! Codeine 3)  ! Clonidine Hcl 4)  ! Ace Inhibitors  Past History:  Past Medical History: Reviewed history from 12/14/2009 and no changes required. 1. Hyperlipidemia. 2. Resistant hypertension times many years.  The patient does have renal artery stenosis.  She has tried calcium channel blockers in the past and states that she would not take them now because she had some problems with her gums which her dentist identified as calcium-channel blocker side effects. 3. Type 2 diabetes. 4. Coronary artery disease, status post anterior MI in 1996 followed  by coronary artery bypass grafting.  The patient's most recent Myoview was done in October 2008, which showed an EF of 77% with no perfusion defects. 5. Renal artery stenosis.  The patient has an occluded right renal artery and an atrophic right kidney.  There is 20% left renal artery stenosis.  This was seen by catheterization in 2007. 6. History of diastolic congestive heart failure.  Most recent echo (12/10) showed EF 55-60% with mild LVH, grade I diastolic dysfunction, mild LAE.  7. Aortic valve disorder: Echo in 2009 showed mean aortic valve gradient of 13 mmHg, suggesting very mild stenosis.  Echo (12/10) suggested aortic  sclerosis only.  8. History of obesity hypoventilation syndrome/OSA.  Patient is now using CPAP.  9. Obesity. 10.Chronic kidney disease.  Most recent creatinine was from February 2010 and was 1.49.  11. Cataract surgery both eyes 2010.   Family History: Reviewed history from 01/29/2009 and no changes required. Family History of Aortic Aneurysm:  Family History of Diabetes:  Family History of Renal Disease:   Social History: Reviewed history from 06/01/2009 and no changes required. The patient does not drink any alcohol.  She never smoked.  She is still working part time at [employer].   Review of Systems       All systems reviewed and negative except as per HPI.   Vital Signs:  Patient profile:   75 year old female Height:      61 inches Weight:      251.50 pounds BMI:     47.69 Pulse rate:   70 / minute BP sitting:   142 / 82  (left arm) Cuff size:   large  Physical Exam  General:  Obese, NAD Neck:  Neck supple, JVP about 8-9 cm. No masses, thyromegaly or abnormal cervical nodes. Lungs:  Clear bilaterally to auscultation and percussion. Heart:  Non-displaced PMI, chest non-tender; regular rate and rhythm, S1, S2 without rubs or gallops. 2/6 early crescendo-decrescendo systolic M RUSB.  Carotid upstroke normal, no bruit. Pedal pulses difficult to palpate given edema.  1+ ankle edema.  Abdomen:  Bowel sounds positive; abdomen soft and non-tender without masses, organomegaly, or hernias noted. No hepatosplenomegaly.  Obese.  Extremities:  No clubbing or cyanosis. Neurologic:  Alert and oriented x 3. Psych:  Normal affect.   Impression & Recommendations:  Problem # 1:  DIASTOLIC HEART FAILURE, CHRONIC (ICD-428.32) Patient has NYHA class III symptoms with some improvement since increasing Lasix.  She is still volume overloaded but less so.  She will continue Lasix at 80 mg qam and 40 mg qpm.  She needs to cut back on the sodium in her diet.   Problem # 2:  CAD, ARTERY  BYPASS GRAFT (ICD-414.04) No chest pain but significantly increased exertional dyspnea over the last few months.  Last stress test was in 2008.  Given this change, I think that it would be reasonable to do a Lexiscan-myoview to assess for ischemia. This has been rescheduled for next month.  She will continue ASA, ARB, Coreg and statin.    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   Problem # 4:  SLEEP APNEA, OBSTRUCTIVE (ICD-327.23) Patient is compliant with CPAP.   Problem # 5:  VITAMINS Patient will bring in a list of the vitamins, etc, that will be involved in her holistic regimen.  We will make sure that none of these seem risky given her heart disease.  Other Orders: Nuclear Stress Test (Nuc Stress Test)  Patient Instructions: 1)  Your physician recommends that you schedule a follow-up appointment in: 2 month 2)  Your physician recommends that you continue on your current medications as directed. Please refer to the Current Medication list given to you today. 3)  Your physician has requested that you have a carotid duplex. This test is an ultrasound of the carotid arteries in your neck. It looks at blood flow through these arteries that supply the brain with blood. Allow one hour for this exam. There are no restrictions or special instructions. 4)  Your physician has requested that you have an adenosine myoview.  For further information please visit [email or web address].  Please follow instruction sheet, as given.

## 2010-10-06 NOTE — Letter (Signed)
Summary: Medical Record Release  Medical Record Release   Imported By: Zenovia Jarred 12/23/2009 12:25:44  _____________________________________________________________________  External Attachment:    Type:   Image     Comment:   External Document

## 2010-10-06 NOTE — Assessment & Plan Note (Signed)
Summary: 4-6 week return/mhh   Visit Type:  Follow-up Copy to:  [name], [name] Primary Provider/Referring Provider:  Juluis Pitch, MD  CC:  The patient says her breathing may be slightly better than before. She says her new cpap mask is now leaking. She wears cpap 6-8 hours every night..  History of Present Illness: 75 yo female with OSA on CPAP 9 cm, hypoxemia likely from OSA/OHS and dyspnea likely from asthma, small airway disease, deconditioning, and diastolic dysfx.  Her breathing has improved.  She is using Qvar two times a day, and proair once or twice per day.  She feels the inhalers do help.  She has been able to do more activity, and is trying to exercise more.  She is not having chest pain, fever, or leg swelling.  She has been doing better with her CPAP since her pressure was changed.  She also got a new CPAP mask, and this fits better.  She feels that CPAP helps her sleep.    Current Medications (verified): 1)  Qvar 80 Mcg/act Aers (Beclomethasone Dipropionate) .... Two Puffs Two Times A Day 2)  Proair Hfa 108 (90 Base) Mcg/act Aers (Albuterol Sulfate) .... Two Puffs Up To Four Times Per Day As Needed For Cough, Wheeze, Chest Congestion, or Shortness of Breath 3)  Catapres-Tts-3 0.3 Mg/24hr Ptwk (Clonidine Hcl) .... As Directed 4)  Carvedilol 25 Mg Tabs (Carvedilol) .... Take One Tablet By Mouth Twice A Day 5)  Diovan 320 Mg Tabs (Valsartan) .[name].. 1 Tab Once Daily 6)  Furosemide 40 Mg Tabs (Furosemide) .... 2 By Mouth in The Am, and 1 By Mouth in The Pm 7)  Aspirin 81 Mg Tbec (Aspirin) .... Take One Tablet By Mouth Daily 8)  Crestor 40 Mg Tabs (Rosuvastatin Calcium) .... Take One Tablet By Mouth Daily. 9)  Glipizide 10 Mg Tabs (Glipizide) .[name].. 1 By Mouth Two Times A Day 10)  Lantus 100 Unit/ml Soln (Insulin Glargine) .... Use As Directed 11)  Synthroid 100 Mcg Tabs (Levothyroxine Sodium) .[name].. 1 Tab Once Daily 12)  Calcitriol 0.25 Mcg Caps (Calcitriol)  .[name].. 1 Tab M, W, F 13)  Fluoxetine Hcl 20 Mg Caps (Fluoxetine Hcl) .[name].. 1 Tab Once Daily 14)  Tandem 162-115.2 Mg Caps (Ferrous Fum-Iron Polysacch) .[name].. 1 Tab Once Daily  Allergies (verified): 1)  ! Norvasc 2)  ! Codeine 3)  ! Clonidine Hcl 4)  ! Ace Inhibitors 5)  * Metformin  Past History:  Past Medical History: Last updated: 03/02/2010 1. Hyperlipidemia. 2. Resistant hypertension times many years.  The patient does have renal artery stenosis.  She has tried calcium channel blockers in the past and states that she would not take them now because she had some problems with her gums which her dentist identified as calcium-channel blocker side effects. 3. Type 2 diabetes. 4. Coronary artery disease, status post anterior MI in 1996 followed by coronary artery bypass grafting.  Lexiscan myoview (4/11): EF 67%, normal perfusion with no evidence for ischemia or infarction.  5. Renal artery stenosis.  The patient has an occluded right renal artery and an atrophic right kidney.  There is 20% left renal artery stenosis.  This was seen by catheterization in 2007. 6. History of diastolic congestive heart failure.  Most recent echo (12/10) showed EF 55-60% with mild LVH, grade I diastolic dysfunction, mild LAE.  7. Aortic valve disorder: Echo in 2009 showed mean aortic valve gradient of 13 mmHg, suggesting very mild stenosis.  Echo (12/10) suggested  aortic sclerosis only.   8. Obesity. 9.Chronic kidney disease.  Most recent creatinine was 1.2.  10. Obstructive sleep apnea       - PSG 01/05/06 AHI 24.8       - CPAP 9 cm H2O 11. Mild asthma      - PFT 02/05/10 FEV1 1.38(&&%), FEV1% 73, TLC 3.78(86%), DLCO 48%, +BD 12. ACE inhibitor cough 13. Hypothyroidism 14. Hypoxemia likely from Obesity-Hypoventilation syndrome       - SpO2 83% with exertion       - 2 liters oxygen 24/7 15. Pulmonary nodule Rt upper lobe, borderline mediastinal adenopathy      - CT chest June 2011 16. V/Q scan (5/11): low  probability of PE.  17. Carotid dopplers (4/11): Mild disease only.   Past Surgical History: Last updated: 01/15/2010 Tubal ligation 1973 Right ear surgery 1979 Hysterectomy 1985 Coronary artery bypass grafting 1996 Bilateral cataract removal 2010  Vital Signs:  Patient profile:   75 year old female Height:      62 inches (157.48 cm) Weight:      258.25 pounds (117.39 kg) BMI:     47.41 O2 Sat:      93 % on 2 L/min Temp:     97.9 degrees F (36.61 degrees C) oral Pulse rate:   67 / minute BP sitting:   136 / 80  (left arm) Cuff size:   large  Vitals Entered By: Francesca Jewett CMA (March 29, 2010 12:02 PM)  O2 Sat at Rest %:  93 O2 Flow:  2 L/min CC: The patient says her breathing may be slightly better than before. She says her new cpap mask is now leaking. She wears cpap 6-8 hours every night. Comments Medications reviewed with the patient. Daytime phone verified. Francesca Jewett [facility]  March 29, 2010 12:03 PM   Physical Exam  General:  on supplemental oxygen and obese.   Nose:  no deformity, discharge, inflammation, or lesions Mouth:  MP 4, no exudate Neck:  no JVD.   Lungs:  diminished breath sounds, no wheezing or rales Heart:  regular rhythm, normal rate, and no murmurs.   Extremities:  minimal ankle edema Cervical Nodes:  no significant adenopathy   Impression & Recommendations:  Problem # 1:  SHORTNESS OF BREATH (ICD-786.05)  Related to obesity, deconditioning, diastolic heart failure, and asthma.  Problem # 2:  SLEEP APNEA, OBSTRUCTIVE (ICD-327.23)  She is to continue on CPAP 9 cm.  Problem # 3:  HYPOXEMIA (ICD-799.02) Related to obesity hypoventilation.  She is to continue on supplemental oxygen.  Problem # 4:  PULMONARY NODULE (ICD-518.89)  She has 5 mm Rt upper lobe nodule, and borderline mediastinal adenopathy.  She will need follow up CT chest w/o contrast (due to renal dysfunction) in December 2011.  Problem # 5:  ASTHMA (ICD-493.90) She has improved  with inhaler therapy.  Will continue on her current regimen.  Complete Medication List: 1)  Qvar 80 Mcg/act Aers (Beclomethasone dipropionate) .... Two puffs two times a day 2)  Proair Hfa 108 (90 Base) Mcg/act Aers (Albuterol sulfate) .... Two puffs up to four times per day as needed for cough, wheeze, chest congestion, or shortness of breath 3)  Catapres-tts-3 0.3 Mg/24hr Ptwk (Clonidine hcl) .... As directed 4)  Carvedilol 25 Mg Tabs (Carvedilol) .... Take one tablet by mouth twice a day 5)  Diovan 320 Mg Tabs (Valsartan) .[name].. 1 tab once daily 6)  Furosemide 40 Mg Tabs (Furosemide) .... 2 by mouth in  the am, and 1 by mouth in the pm 7)  Aspirin 81 Mg Tbec (Aspirin) .... Take one tablet by mouth daily 8)  Crestor 40 Mg Tabs (Rosuvastatin calcium) .... Take one tablet by mouth daily. 9)  Glipizide 10 Mg Tabs (Glipizide) .[name].. 1 by mouth two times a day 10)  Lantus 100 Unit/ml Soln (Insulin glargine) .... Use as directed 11)  Synthroid 100 Mcg Tabs (Levothyroxine sodium) .[name].. 1 tab once daily 12)  Calcitriol 0.25 Mcg Caps (Calcitriol) .[name].. 1 tab m, w, f 13)  Fluoxetine Hcl 20 Mg Caps (Fluoxetine hcl) .[name].. 1 tab once daily 14)  Tandem 162-115.2 Mg Caps (Ferrous fum-iron polysacch) .[name].. 1 tab once daily  Other Orders: Est. Patient Level III [id number])  Patient Instructions: 1)  Follow up in 3 to 4 months

## 2010-10-06 NOTE — Progress Notes (Signed)
  Phone Note From Other Clinic   Summary of Call: Received request from [employer] for copy of office notes in reference to patient's need for supplemental oxygen.  Will have my nurse fax office notes from 02/05/10, 02/24/10, and 03/29/10.  Fax# [phone]. Initial call taken by: Chesley Mires MD,  April 09, 2010 1:06 PM     Appended Document:  requested office notes printed and faxed to number listed above.

## 2010-10-06 NOTE — Miscellaneous (Signed)
Summary: auto CPAP report 01/27/10 to 02/09/10   Clinical Lists Changes Used on 14 of 14 night with average 9hr 15 min per night.  Optimal pressure 10 cm H2O with average AHI 5.7.  Will have her DME set pressure at 10 cm H2O. Orders: Added new Referral order of DME Referral (DME) - Signed

## 2010-10-06 NOTE — Miscellaneous (Signed)
Summary: Orders Update-pft charges//jwr  Clinical Lists Changes  Orders: Added new Service order of Carbon Monoxide diffusing w/capacity (94720) - Signed Added new Service order of Lung Volumes (94240) - Signed Added new Service order of Spirometry (Pre & Post) (94060) - Signed 

## 2010-10-06 NOTE — Letter (Signed)
Summary: [place] By: Phillis Knack 02/25/2010 13:06:52  _____________________________________________________________________  External Attachment:    Type:   Image     Comment:   External Document

## 2010-10-07 NOTE — Progress Notes (Signed)
Summary: Called pt   Phone Note Outgoing Call Call back at [facility] Phone [phone]   Call placed by: Zenovia Jarred,  September 07, 2010 1:37 PM Call placed to: Patient Summary of Call: LMOM TCB to schedule Lipid and Liver. Initial call taken by: Zenovia Jarred,  September 07, 2010 1:37 PM

## 2010-10-07 NOTE — Assessment & Plan Note (Signed)
Summary: F2M/AMD  Medications Added CO-ENZYME Q-10 30 MG CAPS (COENZYME Q10) one tablet once daily MAGNESIUM OXIDE 400 MG TABS (MAGNESIUM OXIDE) Take one tablet once daily.      Allergies Added:   Visit Type:  Follow-up Primary Provider:  Juluis Pitch, MD  CC:  c/o shortness of breath..  History of Present Illness: This is a 75 year old with history of coronary artery disease status post CABG, resistant hypertension, renal artery stenosis, morbid obesity, diastolic congestive heart failure, and OHS/OSA presents to Cardiology Clinic for followup.  She is using home oxygen with exertion for OHS and CPAP at night for OSA.  BP has been better.  All systolic readings recently at home have been less than 160, and most have been around 140.   No chest pain.  Stable exertional dyspnea after walking about 50 feet.  She is trying to walk more.  She has actually lost 2 lbs since last appointment. Blood glucose has been under better control as well.  She gets occasional lightheadedness with standing but this is rare and only if she takes too many medications all at once.   Labs (9/10): creatinine 1.4, HCT 41.7, LDL 68, HDL 42, hgbA1c 8.1% Labs (4/11): BNP 82, LDL 62 Labs (12/23/09): K 4, creatinine 1.24 Labs (6/11): K 4.2, creatinine 1.2, BNP 94 Labs (7/11): LDL 60, HDL 40, LFTs normal Labs (8/11): K 4.2, creatinine 1.25, HCT 40 Labs (11/11): K 4.5, creatinine 1.36 Labs (1/12): LDL 73, HDL 41, LFTs normal  ECG: NSR, normal  Current Medications (verified): 1)  Qvar 80 Mcg/act Aers (Beclomethasone Dipropionate) .... Two Puffs Two Times A Day 2)  Proair Hfa 108 (90 Base) Mcg/act Aers (Albuterol Sulfate) .... Two Puffs Up To Four Times Per Day As Needed For Cough, Wheeze, Chest Congestion, or Shortness of Breath 3)  Catapres-Tts-2 0.2 Mg/24hr Ptwk (Clonidine Hcl) .... Apply One Weekly 4)  Carvedilol 25 Mg Tabs (Carvedilol) .... Take One Tablet By Mouth Twice A Day 5)  Furosemide 40 Mg Tabs  (Furosemide) .... 2 By Mouth in The Am, and 1 By Mouth in The Pm 6)  Aspirin 81 Mg Tbec (Aspirin) .... Take One Tablet By Mouth Daily 7)  Crestor 40 Mg Tabs (Rosuvastatin Calcium) .... Take One Tablet By Mouth Daily. 8)  Glipizide 10 Mg Tabs (Glipizide) .[name].. 1 By Mouth Two Times A Day 9)  Lantus 100 Unit/ml Soln (Insulin Glargine) .... Use As Directed 10)  Calcitriol 0.25 Mcg Caps (Calcitriol) .[name].. 1 Tab M, W, F 11)  Fluoxetine Hcl 40 Mg Caps (Fluoxetine Hcl) .[name].. 1 By Mouth Daily 12)  Tandem 162-115.2 Mg Caps (Ferrous Fum-Iron Polysacch) .[name].. 1 Tab Once Daily 13)  Oxygen 2 Liters .... At Bedtime 14)  Cpap 15)  Micardis 80 Mg Tabs (Telmisartan) .... Take 1 Tablet By Mouth Once A Day 16)  Spironolactone 25 Mg Tabs (Spironolactone) .... Take 1/2 Tablet Daily 17)  Liothyronine Sodium 5 Mcg Tabs (Liothyronine Sodium) .... 2 By Mouth Daily 18)  Hydralazine Hcl 25 Mg Tabs (Hydralazine Hcl) .... Take One Tablet By Mouth Three Times A Day 19)  Co-Enzyme Q-10 30 Mg Caps (Coenzyme Q10) .... One Tablet Once Daily  Allergies (verified): 1)  ! Norvasc 2)  ! Codeine 3)  ! Clonidine Hcl 4)  ! Ace Inhibitors 5)  * Metformin  Past History:  Past Surgical History: Last updated: 01/15/2010 Tubal ligation 1973 Right ear surgery 1979 Hysterectomy 1985 Coronary artery bypass grafting 1996 Bilateral cataract removal 2010  Family History: Last  updated: 01/15/2010 Father - CAD/MI, AAA age 89 Mother - DM, Renal failure age 63 Brother - DM, Renal failure age 68 Sister - DM  Social History: Last updated: 01/15/2010 Married, 3 daughters.  No history of smoking or alcohol use.  Works part time with [employer].  Risk Factors: Alcohol Use: 0 (01/15/2010) Exercise: no (01/29/2009)  Risk Factors: Smoking Status: never (01/15/2010)  Past Medical History: Reviewed history from 03/02/2010 and no changes required. 1. Hyperlipidemia. 2. Resistant hypertension times many years.  The patient does have  renal artery stenosis.  She has tried calcium channel blockers in the past and states that she would not take them now because she had some problems with her gums which her dentist identified as calcium-channel blocker side effects. 3. Type 2 diabetes. 4. Coronary artery disease, status post anterior MI in 1996 followed by coronary artery bypass grafting.  Lexiscan myoview (4/11): EF 67%, normal perfusion with no evidence for ischemia or infarction.  5. Renal artery stenosis.  The patient has an occluded right renal artery and an atrophic right kidney.  There is 20% left renal artery stenosis.  This was seen by catheterization in 2007. 6. History of diastolic congestive heart failure.  Most recent echo (12/10) showed EF 55-60% with mild LVH, grade I diastolic dysfunction, mild LAE.  7. Aortic valve disorder: Echo in 2009 showed mean aortic valve gradient of 13 mmHg, suggesting very mild stenosis.  Echo (12/10) suggested aortic sclerosis only.   8. Obesity. 9.Chronic kidney disease.  Most recent creatinine was 1.2.  10. Obstructive sleep apnea       - PSG 01/05/06 AHI 24.8       - CPAP 9 cm H2O 11. Mild asthma      - PFT 02/05/10 FEV1 1.38(&&%), FEV1% 73, TLC 3.78(86%), DLCO 48%, +BD 12. ACE inhibitor cough 13. Hypothyroidism 14. Hypoxemia likely from Obesity-Hypoventilation syndrome       - SpO2 83% with exertion       - 2 liters oxygen 24/7 15. Pulmonary nodule Rt upper lobe, borderline mediastinal adenopathy      - CT chest June 2011 16. V/Q scan (5/11): low probability of PE.  17. Carotid dopplers (4/11): Mild disease only.   Family History: Reviewed history from 01/15/2010 and no changes required. Father - CAD/MI, AAA age 12 Mother - DM, Renal failure age 67 Brother - DM, Renal failure age 62 Sister - DM  Social History: Reviewed history from 01/15/2010 and no changes required. Married, 3 daughters.  No history of smoking or alcohol use.  Works part time with [employer].  Review  of Systems       All systems reviewed and negative except as per HPI.   Vital Signs:  Patient profile:   76 year old female Height:      62 inches Weight:      258 pounds BMI:     47.36 Pulse rate:   70 / minute BP sitting:   142 / 84  (left arm) Cuff size:   large  Vitals Entered By: Dolores Lory, CMA (September 08, 2010 10:08 AM)  Physical Exam  General:  Well developed, well nourished, in no acute distress. Obese.  Neck:  Neck supple, no JVD. No masses, thyromegaly or abnormal cervical nodes. Lungs:  Clear bilaterally to auscultation and percussion. Heart:  Non-displaced PMI, chest non-tender; regular rate and rhythm, S1, S2 without murmurs, rubs or gallops. Carotid upstroke normal, no bruit.  1+ edema 2/3 up lower  legs bilaterally.  Abdomen:  Bowel sounds positive; abdomen soft and non-tender without masses, organomegaly, or hernias noted. No hepatosplenomegaly. Extremities:  No clubbing or cyanosis. Neurologic:  Alert and oriented x 3. Psych:  Normal affect.   Impression & Recommendations:  Problem # 1:  CAD, ARTERY BYPASS GRAFT (ICD-414.04) Recent normal myoview.  Continue ASA, Crestor, Coreg, ARB.    Problem # 2:  SHORTNESS OF BREATH (ICD-786.05) Multifactorial: OHS/OSA, diastolic CHF, asthma, obesity/deconditioning.   She has lost some weight, which is encouraging.  She is still thinking about going to the bariatric clinic at [facility].  I think that lap banding would be a reasonable step for her.   Problem # 3:  DIASTOLIC HEART FAILURE, CHRONIC (ICD-428.32) Stable multifactorial dyspnea, as above.  Volume appears stable, close to euvolemic.  Continue current dose of Lasix.  Creatinine 1.36 in 11/11.   Problem # 4:  HYPOXEMIA (ICD-799.02) OHS/OSA.  On oxygen with exertion and CPAP at night.   Problem # 5:  HYPERTENSION, UNSPECIFIED (ICD-401.9) Not perfect but control is much better than it has been.  Continue to work on losing weight.  We will call her in 2 weeks  to see what BP is running at home (she will check daily and keep a record).   Other Orders: EKG w/ Interpretation ([id number]) T-Basic Metabolic Panel ([id number]) T-Magnesium [phone])  Patient Instructions: 1)  Your physician recommends that you schedule a follow-up appointment in: 4 months 2)  Your physician has recommended you make the following change in your medication: START Magnesium Oxide 400mg  once daily. 3)  Your physician has requested that you regularly monitor and record your blood pressure readings at home.  Please use the same machine at the same time of day to check your readings and record them to bring to your follow-up visit and [place]. Prescriptions: MAGNESIUM OXIDE 400 MG TABS (MAGNESIUM OXIDE) Take one tablet once daily.  #30 x 6   Entered by:   Darlyne Russian RN   Authorized by:   [name], MD   Signed by:   Darlyne Russian RN on 09/08/2010   Method used:   Electronically to        [facility] (retail)       [place], [place]  [address]       Ph: [phone]       Fax: [phone]   RxID:   [id number]

## 2010-10-13 NOTE — Letter (Signed)
Summary: [facility] Patient Note   [facility] Patient Note   Imported By: Sallee Provencal 10/08/2010 15:37:59  _____________________________________________________________________  External Attachment:    Type:   Image     Comment:   External Document

## 2010-10-27 ENCOUNTER — Encounter: Payer: Self-pay | Admitting: Cardiology

## 2010-10-28 ENCOUNTER — Inpatient Hospital Stay: Payer: Medicare Other | Admitting: Internal Medicine

## 2010-11-03 ENCOUNTER — Encounter: Payer: Self-pay | Admitting: Cardiology

## 2010-11-23 NOTE — Letter (Signed)
Summary: [facility]   Imported By: Sallee Provencal 11/16/2010 14:25:46  _____________________________________________________________________  External Attachment:    Type:   Image     Comment:   External Document

## 2010-12-17 ENCOUNTER — Encounter: Payer: Self-pay | Admitting: Cardiology

## 2010-12-17 ENCOUNTER — Ambulatory Visit (INDEPENDENT_AMBULATORY_CARE_PROVIDER_SITE_OTHER): Payer: Medicare Other | Admitting: Cardiology

## 2010-12-17 DIAGNOSIS — I2510 Atherosclerotic heart disease of native coronary artery without angina pectoris: Secondary | ICD-10-CM

## 2010-12-17 DIAGNOSIS — I5032 Chronic diastolic (congestive) heart failure: Secondary | ICD-10-CM

## 2010-12-17 DIAGNOSIS — I10 Essential (primary) hypertension: Secondary | ICD-10-CM

## 2010-12-17 DIAGNOSIS — I509 Heart failure, unspecified: Secondary | ICD-10-CM

## 2010-12-17 DIAGNOSIS — E785 Hyperlipidemia, unspecified: Secondary | ICD-10-CM

## 2010-12-17 DIAGNOSIS — I25810 Atherosclerosis of coronary artery bypass graft(s) without angina pectoris: Secondary | ICD-10-CM

## 2010-12-17 DIAGNOSIS — R0902 Hypoxemia: Secondary | ICD-10-CM

## 2010-12-17 MED ORDER — FUROSEMIDE 40 MG PO TABS: 40.0000 mg | ORAL_TABLET | Freq: Two times a day (BID) | ORAL | Status: DC

## 2010-12-17 NOTE — Patient Instructions (Signed)
INCREASE Lasix to 40 mg twice daily. Your physician recommends that you return for lab work in: (BMP) 1 week Your physician recommends that you schedule a follow-up appointment in: 1 month

## 2010-12-19 NOTE — Assessment & Plan Note (Signed)
Recent normal myoview.  Continue ASA, Crestor, Coreg, ARB.

## 2010-12-19 NOTE — Assessment & Plan Note (Signed)
OHS/OSA.  On oxygen with exertion and CPAP at night.

## 2010-12-19 NOTE — Assessment & Plan Note (Signed)
Multifactorial dyspnea: OHS/OSA, diastolic CHF, asthma, obesity/deconditioning.  Patient does appear volume overloaded today with elevated neck veins.  Symptoms are fairly stable, NYHA class III.  Her Lasix has been cut down to 40 mg daily s/p episode of ARF.  I am going to increase Lasix today to 40 mg bid.  Her diuretics need to be handled carefully.  I doubt that we will ever be able to fully resolve her lower extremity edema  => too aggressive diuresis will cause severe renal dysfunction, as we have seen.  Must carefully balance diastolic CHF and CKD.  I have asked her to please call our office before making changes to her diuretic regimen. I did not know that she was going on metolozone.  BMET in 1 week.  Followup in the office in 1 month.

## 2010-12-19 NOTE — Assessment & Plan Note (Signed)
Not perfect but control is much better than it has been.  Continue to work on losing weight.

## 2010-12-19 NOTE — Progress Notes (Signed)
PCP: Dr. Hoy Morn  This is a 75 year old with history of coronary artery disease status post CABG, resistant hypertension, renal artery stenosis, morbid obesity, diastolic congestive heart failure, CKD, and OHS/OSA who presents to Cardiology Clinic for followup.  Since I last saw her, she has been at the hospital at [facility].  Prior to this hospitalization, she was seen in another office and started on daily metolozone for lower extremity swelling.  Soon after this, she developed nausea and poor oral intake.  One day in 2/12, she fell and was unable to get up for 5 hours.  She was taken to the ER with altered mental status and found to have creatinine 2.4 and sodium 114.  Diuretics, Micardis, and spironolactone were held and she was gently hydrated.  She spent some time at [facility] after the hospitalization.  She is doing better now and is back home.  Weight is actually down 1 lb since I saw her last.  She is now taking Lasix 40 mg daily (was on Lasix 80 mg qam, 40 mg qpm when I last saw her). She is short of breath walking around in her house, but this is a stable symptom for her.  No chest pain.  She is using home oxygen with exertion for OHS and CPAP at night for OSA. Systolic BP is running mostly < 140 when she checks it.   Labs (9/10): creatinine 1.4, HCT 41.7, LDL 68, HDL 42, hgbA1c 8.1% Labs (4/11): BNP 82, LDL 62 Labs (12/23/09): K 4, creatinine 1.24 Labs (6/11): K 4.2, creatinine 1.2, BNP 94 Labs (7/11): LDL 60, HDL 40, LFTs normal Labs (8/11): K 4.2, creatinine 1.25, HCT 40 Labs (11/11): K 4.5, creatinine 1.36 Labs (1/12): LDL 73, HDL 41, LFTs normal Labs (3/12): TSH normal, HCT 36, K 3.9, creatinine 1.3, HDL 43, LDL 45  ECG: NSR, normal  Allergies (verified):  1)  ! Norvasc 2)  ! Codeine 3)  ! Clonidine Hcl 4)  ! Ace Inhibitors 5)  * Metformin  Family History: Father - CAD/MI, AAA age 4 Mother - DM, Renal failure age 44 Brother - DM, Renal failure age 43 Sister -  DM  Social History: Married, 3 daughters.  No history of smoking or alcohol use.  Works part time with [employer].  Past Medical History: 1. Hyperlipidemia. 2. Resistant hypertension times many years.  The patient does have renal artery stenosis.  She has tried calcium channel blockers in the past and states that she would not take them now because she had some problems with her gums which her dentist identified as calcium-channel blocker side effects. 3. Type 2 diabetes. 4. Coronary artery disease, status post anterior MI in 1996 followed by coronary artery bypass grafting.  Lexiscan myoview (4/11): EF 67%, normal perfusion with no evidence for ischemia or infarction.  5. Renal artery stenosis.  The patient has an occluded right renal artery and an atrophic right kidney.  There is 20% left renal artery stenosis.  This was seen by catheterization in 2007. 6. History of diastolic congestive heart failure.  Most recent echo (12/10) showed EF 55-60% with mild LVH, grade I diastolic dysfunction, mild LAE.  7. Aortic valve disorder: Echo in 2009 showed mean aortic valve gradient of 13 mmHg, suggesting very mild stenosis.  Echo (12/10) suggested aortic sclerosis only.   8. Obesity. 9.Chronic kidney disease.  Most recent creatinine was 1.3.  Patient had acute on chronic renal failure in 2/12, likely due to use of daily metolozone.  10. Obstructive sleep apnea       - PSG 01/05/06 AHI 24.8       - CPAP 9 cm H2O 11. Mild asthma      - PFT 02/05/10 FEV1 1.38(&&%), FEV1% 73, TLC 3.78(86%), DLCO 48%, +BD 12. ACE inhibitor cough 13. Hypothyroidism 14. Hypoxemia likely from Obesity-Hypoventilation syndrome       - SpO2 83% with exertion       - 2 liters oxygen 24/7 15. Pulmonary nodule Rt upper lobe, borderline mediastinal adenopathy      - CT chest June 2011 16. V/Q scan (5/11): low probability of PE.  17. Carotid dopplers (4/11): Mild disease only.   ROS: All systems reviewed and negative except as  per HPI.   Current Outpatient Prescriptions  Medication Sig Dispense Refill  . aspirin 81 MG EC tablet Take 81 mg by mouth daily.        . beclomethasone (QVAR) 80 MCG/ACT inhaler Inhale 1 puff into the lungs as needed.        . carvedilol (COREG) 25 MG tablet Take 25 mg by mouth 2 (two) times daily with a meal.        . co-enzyme Q-10 30 MG capsule Take 30 mg by mouth daily.        . ferrous fumarate-iron polysaccharide complex (TANDEM) 162-115.2 MG CAPS Take 1 capsule by mouth daily with breakfast.        . FLUoxetine (PROZAC) 40 MG capsule Take 40 mg by mouth daily.        . furosemide (LASIX) 40 MG tablet Take 1 tablet (40 mg total) by mouth 2 (two) times daily.  60 tablet  6  . hydrALAZINE (APRESOLINE) 25 MG tablet Take 25 mg by mouth 3 (three) times daily.        . insulin glargine (LANTUS) 100 UNIT/ML injection Inject 50 Units into the skin at bedtime.       [name] levothyroxine (SYNTHROID, LEVOTHROID) 50 MCG tablet Take 50 mcg by mouth daily.        [name] liothyronine (CYTOMEL) 5 MCG tablet Take 5 mcg by mouth 2 (two) times daily.       . polyethylene glycol (MIRALAX / GLYCOLAX) packet Take 17 g by mouth daily as needed.        . rosuvastatin (CRESTOR) 40 MG tablet Take 40 mg by mouth daily.        [name] telmisartan (MICARDIS) 80 MG tablet Take 80 mg by mouth daily.        . traZODone (DESYREL) 50 MG tablet Take 50 mg by mouth daily as needed.        [name] albuterol (PROAIR HFA) 108 (90 BASE) MCG/ACT inhaler Inhale 2 puffs into the lungs every 6 (six) hours as needed.        . calcitRIOL (ROCALTROL) 0.25 MCG capsule Take 0.25 mcg by mouth every other day. M,w,f       . cloNIDine (CATAPRES-TTS-2) 0.2 mg/24hr patch Place 1 patch onto the skin once a week.        [name] glipiZIDE (GLUCOTROL) 10 MG tablet Take 10 mg by mouth 2 (two) times daily before a meal.        . magnesium oxide (MAG-OX) 400 MG tablet Take 400 mg by mouth daily.        [name] spironolactone (ALDACTONE) 25 MG tablet Take 12.5 mg by mouth daily.           BP 146/76  Pulse 69  Ht  5\' 3"  (1.6 m)  Wt 257 lb 1.9 oz (116.629 kg)  BMI 45.55 kg/m2 General:  Well developed, well nourished, in no acute distress. Obese.  Neck:  Neck supple, JVP 10 cm. No masses, thyromegaly or abnormal cervical nodes. Lungs:  Clear bilaterally to auscultation and percussion. Heart:  Non-displaced PMI, chest non-tender; regular rate and rhythm, S1, S2 without murmurs, rubs or gallops. Carotid upstroke normal, no bruit. 2+ edema to knees bilaterally.  Abdomen:  Bowel sounds positive; abdomen soft and non-tender without masses, organomegaly, or hernias noted. No hepatosplenomegaly. Extremities:  No clubbing or cyanosis. Neurologic:  Alert and oriented x 3. Psych:  Normal affect.

## 2010-12-19 NOTE — Assessment & Plan Note (Signed)
Lipids at goal (< 70) when last checked.

## 2010-12-20 NOTE — Progress Notes (Signed)
Note faxed.

## 2010-12-24 ENCOUNTER — Other Ambulatory Visit (INDEPENDENT_AMBULATORY_CARE_PROVIDER_SITE_OTHER): Payer: Medicare Other | Admitting: *Deleted

## 2010-12-24 DIAGNOSIS — I509 Heart failure, unspecified: Secondary | ICD-10-CM

## 2010-12-24 DIAGNOSIS — I5032 Chronic diastolic (congestive) heart failure: Secondary | ICD-10-CM

## 2010-12-24 DIAGNOSIS — I25810 Atherosclerosis of coronary artery bypass graft(s) without angina pectoris: Secondary | ICD-10-CM

## 2010-12-24 LAB — BASIC METABOLIC PANEL
BUN: 16 mg/dL (ref 6–23)
Creat: 1.51 mg/dL — ABNORMAL HIGH (ref 0.40–1.20)
Glucose, Bld: 173 mg/dL — ABNORMAL HIGH (ref 70–99)

## 2010-12-28 ENCOUNTER — Ambulatory Visit: Payer: Medicare Other | Admitting: Family Medicine

## 2011-01-05 ENCOUNTER — Encounter: Payer: Self-pay | Admitting: Cardiology

## 2011-01-18 NOTE — Assessment & Plan Note (Signed)
[facility] OFFICE NOTE   NAME:Dawn Bradford, Dawn Bradford                         MRN:          [id number]  DATE:01/22/2008                            DOB:          05-25-1936    HISTORY:  Ms. Hardman is a 75 year old female who has a history of  hypertension, diabetes, hyperlipidemia, coronary artery disease status  post coronary artery bypassing graft and renal artery stenosis.  Her  most recent Myoview was performed on June 19, 2007.  At that time,  she was noted to have an ejection fraction of 77%.  There was no  ischemia or infarction.  She also has been noted to have an occluded  right renal artery and 20% left renal artery.  Since she was last seen,  she does have some dyspnea on exertion which has been a chronic issue.  There is no orthopnea, PND and her pedal edema is unchanged.  She does  not have exertional chest pain, palpitations or syncope.  Note, she has  been out of her Catapres patch for approximately 1 month by her report.   MEDICATIONS:  1. Minoxidil 5 mg p.o. b.i.d.  2. Diovan 320 mg p.o. daily.  3. Aspirin 81 mg p.o. daily.  4. Prozac 20 mg p.o. daily.  5. Lasix 80 mg p.o. b.i.d.  6. Glipizide 10 mg p.o. daily.  7. Lopressor 50 mg p.o. b.i.d.  8. Lipitor 80 mg p.o. daily.  9. Levothroid 50 mcg p.o. daily.  10.Tandem vitamin.   PHYSICAL EXAMINATION:  VITAL SIGNS:  Blood pressure of 155/84, pulse is  65.  She weighs 254 pounds.  HEENT:  Normal.  NECK:  Supple.  CHEST:  Clear.  CARDIOVASCULAR:  Regular rhythm.  There is a 2/6 systolic ejection  murmur.  ABDOMEN:  Shows no tenderness.  EXTREMITIES:  Show 1+ ankle edema.   DIAGNOSTICS:  Her electrocardiogram shows a sinus rhythm at a rate of  65.  There is lateral T-wave inversion which is unchanged from previous.   DIAGNOSES:  1. Coronary artery disease status post coronary artery bypassing graft      - Ms. Mcwilliams is having no chest pain and her  recent Myoview showed no      ischemia.  We will continue with medical therapy including her      aspirin, ARB, statin and beta-blocker.  We discussed today the      importance of exercise.  2. Dyspnea - this is most likely secondary to deconditioning, obesity      hypoventilation syndrome as well as diastolic congestive heart      failure.  Dr. Marval Regal was following her renal function.  3. Hypertension - her blood pressure is mildly elevated today.  We      have asked her to resume her Catapres patch at previous dose.  She      will track this at home.  4. Hyperlipidemia - she will continue on Lipitor.  Dr. Lovie Macadamia is      following her lipids and liver, and we will have those forwarded to  [place] for our records.  5. History of sleep apnea - she will continue on her CPAP.  6. Diabetes mellitus.  7. Renal artery stenosis - I have asked her to discuss this with Dr.      Marval Regal.  She may require follow up imaging of the left renal      artery (her right renal artery has been occluded in the past).  8. Obesity.   FOLLOW UP:  She is to return to this office in 6 months.     Denice Bors Stanford Breed, MD, [facility]  Electronically Signed    BSC/MedQ  DD: 01/22/2008  DT: 01/22/2008  Job #: [id number]   cc:   [name], M.D.  Juluis Pitch

## 2011-01-18 NOTE — Assessment & Plan Note (Signed)
[facility] OFFICE NOTE   NAME:Bradford, Dawn BITNER                         MRN:          [id number]  DATE:07/27/2007                            DOB:          12/09/1935    Dawn Bradford is a 75 year old female whom I recently saw on October 3,2008  with a history of hypertension, diabetes mellitus, hyperlipidemia,  coronary artery disease status post coronary artery bypass grafting, as  well as a history of renal artery stenosis.  When I last saw her, we  scheduled her to have a Myoview, as she was complaining of dyspnea.  This was performed on June 19, 2007.  The perfusion was normal and  the ejection fraction was 77%.  Note, she had a renal arteriogram  performed by Dr. Albertine Patricia on December 06, 2005.  Her right renal artery was  occluded, and there was a 20% left renal artery stenosis.  When I saw  her last, her blood pressure was elevated.  We increased her minoxidil  to 5 mg p.o. b.i.d.  I also increased her Vytorin to 10/80 daily as her  LDL was greater than 70.  Since then, she does have dyspnea on exertion,  but attributes this to her weight.  There is no orthopnea or PND, but  she does have pedal edema.  She has not had chest pain.   MEDICATIONS:  1. Minoxidil 5 mg p.o. b.i.d.  2. Diovan 320 mg p.o. daily.  3. Aspirin 81 mg p.o. daily.  4. Prozac 20 mg p.o. daily.  5. Catapres patch.  6. Calcitriol.  7. Lasix 80 mg in the morning and 40 in the evening.  8. Glipizide 10 mg p.o. daily.  9. Coreg 12.5 mg p.o. b.i.d.  10.Vytorin 10/80 daily.   PHYSICAL EXAM:  Blood pressure 138/88 and her pulse is 95.  She weighs  257 pounds.  HEENT:  Normal.  NECK:  Supple and I cannot appreciate bruits.  CHEST:  Clear.  CARDIOVASCULAR:  Regular rate and rhythm.  There is a 2/6 systolic  murmur at the left sternal border.  S2 is not diminished.  ABDOMEN:  Shows no tenderness.  EXTREMITIES:  Show 1+ ankle edema.   Her  electrocardiogram shows a sinus rhythm at a rate of 95.  The axis is  normal.  There are nonspecific ST changes.   DIAGNOSES:  1. Coronary artery disease status post coronary artery bypass grafting      - Dawn Bradford's Myoview showed normal perfusion and normal left      ventricular function.  We will continue with her aspirin, ARB,      Statin, and beta blocker.  She will continue with risk factor      modification.  2. Dyspnea - I think there is a component of diastolic dysfunction as      well as obesity hypoventilation syndrome.  She will continue on her      Lasix.  Dr. Marval Regal checked her renal function earlier this week      by her report.  3. Hypertension -  her blood pressure has improved.  Her diastolic is      minimally elevated, and she states she has been running in the      120/80 range previously.  We will continue with her present      medications.  4. Hyperlipidemia - we recently increased her Vytorin and I will check      lipids and liver, and adjust as indicated.  5. History of sleep apnea - she is complaining of fatigue and I have      explained that sleep apnea can cause daytime fatigue and also      difficulty controlling blood pressure.  I have encouraged her to      followup with Dr. Lovie Macadamia for adjustment of her CPAP.  6. Obesity.  7. History of diabetes mellitus.  8. Renal artery stenosis - Dr. Marval Regal is following this.  We will      see her back in approximately 6 months.     Denice Bors Stanford Breed, MD, [facility]  Electronically Signed    BSC/MedQ  DD: 07/27/2007  DT: 07/27/2007  Job #: [id number]   cc:   Andy Gauss, M.D.

## 2011-01-18 NOTE — Assessment & Plan Note (Signed)
[facility] OFFICE NOTE   NAME:Dawn Bradford, Dawn Bradford                         MRN:          [id number]  DATE:06/08/2007                            DOB:          [birth year]    Dawn Bradford is a 75 year old female with past medical history of  hypertension, diabetes mellitus, hyperlipidemia, as well as coronary  artery disease, status post coronary bypass and graft.  She also has a  history of renal artery stenosis.  Note, her cardiac history dates back  to 1996, when she underwent coronary artery bypass and graft in [place],  [place].  She also has a history of renal artery stenosis.  She  underwent peripheral arteriogram by Dr. Albertine Patricia in April of 2007.  At  that time, she had an occluded right renal artery.  There was a 20% left  renal artery.  Medical therapy has been recommended.   Since she was last seen, she has some dyspnea on exertion, which has  been a chronic issue.  There is no orthopnea or PND, but she does  occasionally have pedal edema.  She has not had chest pain, palpitations  or syncope.   MEDICATIONS INCLUDE:  1. Diovan 320 mg p.o. daily.  2. Aspirin 81 mg p.o. daily.  3. Vytorin 10/40 daily.  4. Prozac 20 mg p.o. daily.  5. Catapres patch.  6. Calcitriol.  7. Lasix 80 mg in the morning and 40 in the evening.  8. Glipizide 10 mg p.o. daily.  9. Minoxidil 2.5 mg p.o. b.i.d.  10.Coreg 12.5 mg p.o. b.i.d.   PHYSICAL EXAM TODAY:  Shows a blood pressure of 140/74 and her pulse is  68.  She weighs 254 pounds.  HEENT:  Normal.  NECK:  Supple with no bruits.  CHEST:  Clear.  CARDIOVASCULAR EXAM:  Reveals a regular rate and rhythm.  There is a 2/6  systolic ejection murmur at the left sternal border.  ABDOMINAL EXAM:  Significant for obesity.  EXTREMITIES:  Show 1+ ankle edema.   NOTE:  She did have laboratories drawn recently, on April 13, 2007.  Her  BUN and creatinine were 22 and 1.3.  Her total  cholesterol was 149, with  an LDL of 90.7.   DIAGNOSES:  1. Coronary artery disease:  Dawn Bradford is having some dyspnea, which      may be a combination of diastolic dysfunction and obesity      hypoventilation syndrome.  However, we also need to exclude an      anginal equivalent.  I will schedule her for an adenosine Myoview      for risk stratification.  If it shows no ischemia, then we will      continue with medical therapy.  This will include her aspirin,      statin, beta blocker and ARB.  2. Hypertension:  Her blood pressure remains elevated.  I have asked      her to increase her Minoxidil to 5 mg p.o. b.i.d.  She will      continue to track her blood  pressure at home and we will adjust as      indicated.  3. Hyperlipidemia:  Her recent LDL was greater than 70 and I have      asked her to increase her Vytorin to 10/80 daily.  We will check      lipids and liver in six weeks and adjust as indicated.  Note:  We      will also check a BNP, given her dyspnea.  4. History of sleep apnea.  5. Obesity.  6. History of diabetes mellitus.  7. Renal artery stenosis, which totally occludes the right renal      artery and atrophic right kidney.   She will continue with risk factor modification and we discussed the  importance of diet and exercise.  I will see her back in six weeks.     Denice Bors Stanford Breed, MD, [facility]  Electronically Signed    BSC/MedQ  DD: 06/08/2007  DT: 06/08/2007  Job #: [id number]   cc:   [name], M.D.  [facility] [place] [facility] [place] [address] Juluis Pitch

## 2011-01-18 NOTE — Assessment & Plan Note (Signed)
[facility] OFFICE NOTE   NAME:Dawn Bradford, Dawn Bradford                         MRN:          [id number]  DATE:10/24/2008                            DOB:          [birth year]    PRIMARY CARE PHYSICIAN:  Juluis Pitch at the [facility].   NEPHROLOGIST:  [name], MD   HISTORY OF PRESENT ILLNESS:  This is a 75 year old with history of  coronary artery disease status post CABG, resistant hypertension, renal  artery stenosis, morbid obesity, and diastolic congestive heart failure  who presents to Cardiology Clinic for followup.  When I last saw the  patient, we did replace her Lopressor with Coreg.  Her blood pressure  does seem to be better today, it is 131/81.  She has tolerated the Coreg  without major problems.  She does say that about half an hour after the  Coreg, she notices that if she stands up too quickly, she will become  mildly lightheaded; however, she has learned to stand slowly from a  seated position and if she does that, she does not have any problems.  The patient's main complaint actually has been that over the last 2  months, she has had a significant amount of nausea.  She says that she  has been nauseated essentially every day.  She has not been eating as  much, and she has actually lost 26 pounds.  She has had occasional  episodes of vomiting.  She dates the onset of the symptoms back to when  she started metformin which she says was a couple of months ago.  She  was taken off glipizide and put on metformin.  She has not had her renal  function assessed to my knowledge since November 2009 when her  creatinine was 1.42.  The patient does continue to have some dyspnea on  exertion, this is stable.  If she walks up a flight of steps, she is  somewhat short of breath by the time she gets to the top. She is short  of breath walking up an incline.  She does not have a lot of trouble  with  walking on flat ground.  She sleeps on 2 pillows at night.  It  seems like she may have some mild orthopnea, this is also chronic.  The  patient's other main complaint is that she is very tired during the day  and fatigued.  She has actually not been wearing her CPAP.  She says she  feels like the CPAP mask is uncomfortable and she says she wakes up  frequently during the night.  She does not feel like she gets restful  sleep.  The patient has not have any significant episodes of chest pain  since the last time I saw her.  She gets an occasional twinge in her  central chest, usually this is not associated with exertion.   MEDICATIONS:  1. Coreg 12.5 mg twice a day.  2. Minoxidil 5 mg twice a day.  3. Aspirin 81 mg daily.  4. Valsartan 320 mg daily.  5. Catapres-TTS #2 patch.  6. Calcitriol.  7. Lasix 80 mg twice a day.  8. Lipitor 80 mg daily.  9. Levoxyl 100 mg daily.  10.Metformin 750 mg daily.   PAST MEDICAL HISTORY:  1. Hyperlipidemia.  2. Resistant hypertension times many years.  The patient does have      renal artery stenosis.  She is currently on minoxidil, valsartan,      Catapres, and Coreg.  Blood pressure is better controlled on this      regimen.  She has tried calcium channel blockers in the past and      states that she would not take them now because she had some      problems with her gums which her dentist identified as calcium-      channel blocker side effects.  3. Type 2 diabetes.  4. Coronary artery disease, status post anterior MI in 1996 followed      by coronary artery bypass grafting.  The patient's most recent      Myoview was done in October 2008, which showed an EF of 77% with no      perfusion defects.  5. Renal artery stenosis.  The patient has an occluded right renal      artery and an atrophic right kidney.  There is 20% left renal      artery stenosis.  This was seen by catheterization in 2007.  6. History of diastolic congestive heart failure.   The patient's most      recent echo was in December 2009.  EF was 60%.  There was mild to      moderate LVH.  There was mild aortic stenosis with a mean gradient      of 13 mmHg.  There is mild left atrial enlargement.  7. Mild aortic stenosis.  This was noted on echo in 2009.  The patient      did have a mean gradient of 13 mmHg.  8. History of obesity hypoventilation syndrome/OSA.  The patient is      supposed to be on CPAP at night; however, she is not using it.  9. Obesity.  10.Chronic kidney disease.  Most recent creatinine was from November      2009 which was 1.42.   SOCIAL HISTORY:  She does not drink any alcohol.  She never smoked.   Most recent labs from November 2009, creatinine 1.42, BNP 175.  LDL from  January 2009 was 76.   PHYSICAL EXAMINATION:  VITAL SIGNS:  Blood pressure is 131/81, heart  rate is 89 and regular, weight is 233 pounds, this is down from 259  pounds when I last checked it.  GENERAL:  This is obese female, in no apparent distress.  NEUROLOGIC:  Alert and oriented x3.  Normal affect.  NECK:  JVP 8 cm of water.  No thyromegaly or thyroid nodule.  CARDIOVASCULAR:  Heart, regular S1 and S2.  There is an S4 noted.  There  is a 2/6 crescendo-decrescendo early peaking systolic murmur at the  right upper sternal border.  There is trace edema of the ankles.  There  are no carotid bruits.  ABDOMEN:  Obese, soft, and nontender.  No hepatosplenomegaly.  LUNGS:  Clear to auscultation bilaterally.  Normal respiratory effort.   ASSESSMENT AND PLAN:  This is a 75 year old with a history of coronary  artery disease status post coronary artery bypass grafting who also has  resistant hypertension, diastolic congestive heart failure, and morbid  obesity.  1. Hypertension.  The patient's hypertension seems to be under better      control.  Today, it is 131/81 with change of Lopressor to Coreg.      This is not an ideal blood pressure for her with her diabetes and       her chronic kidney disease.  I would prefer to have her systolic      blood pressure definitely less than 130.  However, I am not going      to increase her Coreg today as she does get some symptoms of mild      lightheadedness from standing after she takes her Coreg.  I think      we will continue her on her current medication regimen for now.  2. Coronary artery disease.  The patient has had no significant chest      pain symptom.  She had a negative Myoview in October 2008.  I think      this is stable.  We will continue on her aspirin, statin, ARB, and      beta-blocker.  We are going to check her lipids today.  3. Chronic kidney disease.  This is likely multifactorial from      diabetes, hypertension, and atherosclerotic renal artery disease.      I am a bit concerned given her 60-month history of nausea and      occasional vomiting that perhaps her renal function has worsened.      The other option is that these symptoms are due to her metformin      use as she dates the symptoms to when she started using metformin.      We will go ahead and check her renal function today.  4. Diastolic congestive heart failure.  The patient's shortness of      breath is likely multifactorial due to diastolic congestive heart      failure, obesity hypoventilation syndrome, and volume retention      from chronic kidney disease.  She does not seem significantly      volume overloaded today.  Her BNP was only mildly elevated at 175      in November 2009.  Her symptoms are stable.  I am going to continue      her on her current dose of Lasix for now.  She will get a BNP with      her labs today.  5. Nausea and vomiting.  This has been quite significant.  The patient      has lost 26 pounds over the last couple of months.  The patient      dates  the onset to the use of metformin.  GI side effects are      common with metformin, so I am going to go ahead and have her stop      the metformin.  I am going  to put her back on glipizide 10 mg a      day.  Metformin additionally is probably not a good agent for her      given her chronic kidney disease.  Finally, nausea and vomiting      also could be suggestive of progressive renal dysfunction.  We will      be checking her kidney function today.  She is going to follow up      with Dr. Lovie Macadamia early next week and he can assess her diabetes      regimen at that  time.  6. Fatigue and sleepiness.  I do think this may be due to the      patient's obstructive sleep apnea.  She does need to use her CPAP.      I have encouraged her to try her CPAP again.  I said I would refer      back to Pulmonary to see if they could make her CPAP regimen more      comfortable for her.  However, she wants to try the CPAP that she      has again to see if she can tolerate.  Therefore, I have asked her      to do this.     [name], MD  Electronically Signed    DM/MedQ  DD: 10/24/2008  DT: 10/25/2008  Job #: [id number]   cc:   Andy Gauss, M.D.

## 2011-01-18 NOTE — Assessment & Plan Note (Signed)
[place] OFFICE NOTE   NAME:Bradford, Dawn PETTINATO                         MRN:          [id number]  DATE:04/24/2007                            DOB:          1935/12/14    PRIMARY CARE PHYSICIAN:  Dr. Juluis Pitch at the [facility].   RENAL DOCTOR:  [name], M.D.   INTERVAL HISTORY:  Dawn Bradford is a 75 year old woman with multiple medical  problems including recalcitrant hypertension, morbid obesity,  hypertension, diabetes, hyperlipidemia, and coronary artery disease  status post previous bypass surgery.  She also has a history of renal  artery stenosis and underwent angiogram, which showed an atrophic kidney  with a totally occluded right renal artery.  Her left renal artery was  widely patent.  She was previously followed by Dr. Albertine Patricia and returns  today for routine followup.  She has not been seen in our office for  over 18 months.   She tells me that she is continuing to have severe fatigue and also  chronic dyspnea.  She has had some mild lower extremity edema, and she  had been taking Lasix, but says that she has not been getting much  benefit.  She has been doing her best to take her medications, but ran  out of her minoxidil about a month ago and has not refilled it.  She  also has not been checking her sugars and has been noncompliant with her  CPAP.   In talking with her daughter, it appears that Dawn Bradford eats out with her  husband almost every day for lunch and, for many dinners, she goes to  [employer] or [employer] almost every day.   CURRENT MEDICATIONS:  1. Diovan 320 a day.  2. Aspirin 81.  3. Vytorin 10/40.  4. Prozac 20.  5. Catapres patch 2 every week.  6. Minoxidil 2.5, which she is out of.  7. Calcitriol 0.25 Monday, Wednesday, Friday.  8. Toprol XL 100 in the morning and 50 at night.  9. Lasix 80 in the morning and 40 at night.  10.Glipizide 10 a day.   ALLERGIES/DRUG  INTOLERANCES:  NORVASC causes swollen gums.  ORAL  CLONIDINE causes tingling hands.  ACE INHIBITORS cause cough.  CODEINE  causes nausea and hives.   PHYSICAL EXAM:  She is a morbidly obese woman who ambulates around the  clinic slowly, but no obvious respiratory difficulty.  Blood pressure is difficult to auscultate, but it was about 158/86 in  the left arm.  Heart rate is 66.  Weight is 252 pounds.  HEENT:  Normal.  NECK:  Supple and thick.  It is hard to assess for JVD.  It does not  appear markedly elevated.  Carotids are 2+ bilaterally.  There are no  bruits.  No lymphadenopathy or thyromegaly appreciated.  CARDIAC:  SHE has distant heart sounds.  She is regular.  There is an  S4.  No obvious murmur.  LUNGS:  Clear.  ABDOMEN:  Obese, nontender, and nondistended.  I am unable to appreciate  any hepatosplenomegaly.  No bruits.  No masses, although exam is limited  due to her habitus.  EXTREMITIES:  Warm with no cyanosis or clubbing.  There is 1+ edema  bilaterally.  NEURO:  Alert and oriented x3.  Cranial nerves 2-12 are grossly intact.  Moves all 4 extremities without difficulty.  Affect is flattened.   EKG shows normal sinus rhythm at a rate of 66.  There are nonspecific T  wave changes in I and aVL.   ATTENDING PHYSICIAN:  1. Coronary artery disease status post bypass surgery.  This is      stable.  2. Hypertension, suboptimally controlled.  3. Hyperlipidemia, followed by Dr. Lovie Macadamia.  4. Sleep apnea, not compliant with CPAP.  5. Dietary indiscretion, ongoing.  6. Morbid obesity.  7. Diabetes with noncompliance with glucose monitoring.  8. Renal artery stenosis with totally occluded right renal artery and      atrophic right kidney.   DISCUSSION/PLAN:  I had a long discussion with Dawn Bradford and her daughter  about the need to be more compliant with her medical regimen and take  responsibility for her health.  I told her that she is at high risk for  losing her kidney  and having significant cardiovascular complications.  At this point, I think we need to focus just on several things and see  her on a frequent basis in conjunction with Dr. Lovie Macadamia to help her  get back on track.  At this point, I have recommended:  1. To change her Toprol over to Coreg 12.5 b.i.d. as she does not      tolerate the minoxidil very well.  2. Follow her sugars at least twice a day and bring me a record of      those.  3. Get a wrist blood pressure cuff so that she can follow her blood      pressures.  4. Start walking twice a day just for a few minutes each time until      she can work up to 10 minutes twice a day.   We will then see her back on a monthly basis in [place] to continue  to work with her.   Total time spent is approximately 45 minutes.     Dawn Pascal. Bensimhon, MD  Electronically Signed    DRB/MedQ  DD: 04/24/2007  DT: 04/25/2007  Job #: [id number]   cc:   [name], M.D.

## 2011-01-18 NOTE — Assessment & Plan Note (Signed)
[facility] OFFICE NOTE   NAME:Dawn Bradford, Dawn Bradford                         MRN:          [id number]  DATE:07/18/2008                            DOB:          Jan 09, 1936    PRIMARY CARE PHYSICIAN:  Dr. Juluis Pitch at [facility]   NEPHROLOGIST:  [name], MD   HISTORY OF PRESENT ILLNESS:  This is a 75 year old with a history of  coronary artery disease status post coronary artery bypass grafting,  resistant hypertension, renal artery stenosis, and morbid obesity who  presents to Cardiology Clinic for followup.  The patient states that her  shortness of breath on exertion has continued to gradually worsen.  She  is severely short of breath if she tries to climb a flight of steps.  She is able to walk in her house for the most part without significant  shortness of breath; however if she gets outside and tries to walk for  about a block on flat ground, she becomes short of breath.  She has no  PND.  She sleeps on two pillows, says she has been doing this for a long  time and denies any definite orthopnea.  She says sometimes she can  sleep on 1 pillow as well.  She is not using her CPAP.  She states that  it is uncomfortable for her.  It appears that her weight continues to  rise.  She has gained about 5 pounds since the last time we saw her.  She denies any chest pain.  Blood pressure was still elevated today at  146/78.   MEDICATIONS:  1. Minoxidil 5 mg b.i.d.  2. Valsartan 360 mg daily.  3. Aspirin 81 mg daily.  4. Prozac 20 mg daily.  5. Catapres patch.  6. Lasix 80 mg b.i.d.  7. Glipizide 10 mg daily.  8. Lopressor 50 mg b.i.d.  9. Lipitor 80 mg daily.  10.Levoxyl 100 mcg daily.  11.Metformin 750 mg daily.   PAST MEDICAL HISTORY:  1. Hypercholesterolemia.  2. Resistant hypertension times many years.  The patient does have      renal artery stenosis.  She is currently on minoxidil,  valsartan,      Catapres, and metoprolol.  She has tried calcium-channel blockers      in the past and states that she will not take them now because she      had some problems with her gums which her dentist identified as      calcium-channel blocker side effects.  3. Type 2 diabetes.  4. Coronary artery disease status post anterior MI in 1996 followed by      coronary artery bypass grafting.  The patient's most recent Myoview      was done in October 2008, showed an EF of 77% with no perfusion      defects.  5. Renal artery stenosis.  The patient has an occluded right renal      artery and an atrophic right kidney.  There is 20% left renal      artery stenosis.  This was seen on a catheterization in 2007.  6. History of diastolic congestive heart failure.  7. History of obesity hypoventilation syndrome.  The patient      especially on CPAP at night, however, she is not using it.  8. Obesity.  9. Chronic kidney disease.  Most recent creatinine we have is from      August 2008 and is 1.3.   SOCIAL HISTORY:  The patient does not drink any alcohol.  She never  smoked.   EKG today normal sinus rhythm.  There is an old inferior MI.  There are  lateral T-wave inversions.  Most recent labs from January 2009 showed an  LDL of 76, HDL of 47, creatinine was 1.3 in August 2008.   PHYSICAL EXAMINATION:  VITAL SIGNS:  Blood pressure 146/78, heart rate  is 80 and regular, and weight is 259 pounds.  GENERAL:  This is an obese female in no apparent distress.  NEUROLOGICAL:  Alert and oriented x3.  Normal affect.  NECK:  There is no JVD.  There is no thyromegaly or thyroid nodule.  CARDIOVASCULAR:  Heart regular S1 and S2.  There is an S4 noted.  There  is no murmur.  There is 2+ peripheral edema to the knees bilaterally.  There are no carotid bruits.  ABDOMEN:  Obese, soft, nontender, no hepatosplenomegaly.  LUNGS:  Clear to auscultation bilaterally with normal respiratory  effort.    ASSESSMENT AND PLAN:  This is a 75 year old with a history of coronary  artery disease status post coronary artery bypass grafting, resistant  hypertension with renal artery stenosis, diastolic congestive heart  failure, and morbid obesity who presents to Cardiology Clinic for  followup.  1. Hypertension.  The patient does have a significant history of      resistant hypertension.  She does have renal artery stenosis with      an occluded right renal artery and 20% left renal artery stenosis      on a catheterization in 2007.  Her blood pressure seems to be      elevated today at 146/78.  She is on minoxidil, valsartan,      Catapres, Lasix, and Lopressor.  She states that she cannot take      calcium-channel blockers because of gum side effects.  Today, I      will have her stop Lopressor.  We will put her on carvedilol at      12.5 mg twice a day, this is a better blood pressure medication.      We will have her back in 2 weeks for blood pressure check.  2. Coronary artery disease.  The patient has no chest pain symptoms.      She did have a negative Myoview in October 2008.  I think this is      stable.  We will continue on her aspirin, statin, ARB, and beta-      blocker.  Her LDL was 76 in January 2009, and goal LDL for her      would be 70 or below.  3. Atherosclerotic renal artery disease.  The patient does have an      occluded right renal artery.  She had 20% left renal artery      stenosis in 2007.  Given her one functional kidney, I think it is      reasonable to go ahead and reimage her left renal artery with a      renal artery MRA.  We will obtain her results of her most recent      lab work that was done recently at her nephrologist's office to      make sure that her creatinine is at a reasonable level for her to      do an MRA.  4. Dyspnea on exertion.  The patient has steadily progressive dyspnea      on exertion.  I think this is probably multifactorial and imagine       that there is a component of diastolic congestive heart failure.      The patient also does have obesity hypoventilation syndrome, so      that pulmonary hypertension from hypoxic pulmonary vasoconstriction      is a possibility.  Additionally, she is morbidly obese, and I think      that obesity and deconditioning have a lot to do with her shortness      of breath.  Given the progressive nature, we will check a BNP, and      we also will check an echocardiogram.     [name], MD  Electronically Signed    DM/MedQ  DD: 07/18/2008  DT: 07/19/2008  Job #: [id number]   cc:   Andy Gauss, M.D.

## 2011-01-21 ENCOUNTER — Encounter: Payer: Self-pay | Admitting: Cardiology

## 2011-01-21 ENCOUNTER — Ambulatory Visit (INDEPENDENT_AMBULATORY_CARE_PROVIDER_SITE_OTHER): Payer: Medicare Other | Admitting: Cardiology

## 2011-01-21 DIAGNOSIS — R0602 Shortness of breath: Secondary | ICD-10-CM

## 2011-01-21 DIAGNOSIS — I5032 Chronic diastolic (congestive) heart failure: Secondary | ICD-10-CM

## 2011-01-21 DIAGNOSIS — I25810 Atherosclerosis of coronary artery bypass graft(s) without angina pectoris: Secondary | ICD-10-CM

## 2011-01-21 DIAGNOSIS — I509 Heart failure, unspecified: Secondary | ICD-10-CM

## 2011-01-21 DIAGNOSIS — E785 Hyperlipidemia, unspecified: Secondary | ICD-10-CM

## 2011-01-21 DIAGNOSIS — G4733 Obstructive sleep apnea (adult) (pediatric): Secondary | ICD-10-CM

## 2011-01-21 LAB — BASIC METABOLIC PANEL
BUN: 19 mg/dL (ref 6–23)
Calcium: 8.9 mg/dL (ref 8.4–10.5)
Potassium: 4.1 mEq/L (ref 3.5–5.3)
Sodium: 136 mEq/L (ref 135–145)

## 2011-01-21 MED ORDER — FUROSEMIDE 40 MG PO TABS: 40.0000 mg | ORAL_TABLET | ORAL | Status: DC

## 2011-01-21 NOTE — Op Note (Signed)
NAME:  Dawn Bradford, Dawn Bradford                  ACCOUNT NO.:  [id number]   MEDICAL RECORD NO.:  [id number]          PATIENT TYPE:  AMB   LOCATION:  SDS                          FACILITY:  [facility]   PHYSICIAN:  Ethelle Lyon, M.D. LHCDATE OF BIRTH:  [birth year]   DATE OF PROCEDURE:  12/06/2005  DATE OF DISCHARGE:                                 OPERATIVE REPORT   PROCEDURE:  Selective left renal angiography, abdominal aortography.   INDICATIONS:  Dawn Bradford is a 75 year old woman with longstanding poorly  controlled hypertension and a recent diagnosis of diabetes mellitus.  Her  blood pressures have been difficult to control despite compliance with five  medications.  Creatinine is proximally 1.5.  Renal artery duplex examination  performed December 5 at [facility] demonstrated an atrophic right  kidney measuring 7.1 cm with the left measuring 13 cm.  The right renal  artery is felt to be occluded, the left renal artery was felt likely to be  normal.  Given her difficulty to control hypertension, she is referred for  angiography to confirm these noninvasive findings.   PROCEDURE TECHNIQUE:  Informed consent was obtained.  Underwent 1% lidocaine  local anesthesia, a 5 French sheath was placed in the right common femoral  artery using modified Seldinger technique.  A 5 French pigtail catheter was  advanced to the suprarenal abdominal aorta.  Abdominal aortography was  performed using carbon dioxide as the contrast agent.  This demonstrated  single renal arteries bilaterally.  The right was clearly occluded.  No flow  to the renal parenchyma was demonstrated.  I then advanced a 5 [place] LIMA  catheter and engaged it in the ostium of the single left renal artery.  I  performed angiography using dilute contrast.  This demonstrated less than  30% ostial stenosis.  The remainder of the renal artery was normal.  The  catheter was then removed over wire.  The patient was then transferred to  the  holding room in stable condition.  The sheaths will be removed there.   COMPLICATIONS:  None.   TOTAL CONTRAST:  6 mL.   FINDINGS:  1.  Abdominal aorta:  Diffuse plaquing without some stenosis or aneurysm.  2.  Right renal artery:  Occluded proximally.  3.  Left renal artery:  Single vessel.  There is calcification at its ostium      with approximately 20% stenosis.  The remainder of the vessel was      normal.   IMPRESSION:  Occluded right renal artery and with only mild stenosis of the  left renal artery.   PLAN:  Continued conservative management.  Will add Clonidine patch to her  medical regimen, further recommendation of Dr. Myna Hidalgo.      Ethelle Lyon, M.D. [facility]  Electronically Signed     WED/MEDQ  D:  12/06/2005  T:  12/06/2005  Job:  [id number]   cc:   [name], M.D.  Fax: [phone]

## 2011-01-21 NOTE — Patient Instructions (Signed)
Increase your Lasix to 40mg  in AM and 20mg  in PM We will call you will your lab results done today. Your physician recommends that you return for lab work in: 2 weeks (BMP/BNP) Your physician recommends that you schedule a follow-up appointment in: 2 months

## 2011-01-21 NOTE — H&P (Signed)
NAME:  Dawn Bradford, Dawn Bradford                  ACCOUNT NO.:  [id number]   MEDICAL RECORD NO.:  [id number]          PATIENT TYPE:  AMB   LOCATION:  SDS                          FACILITY:  [facility]   PHYSICIAN:  Ethelle Lyon, M.D. LHCDATE OF BIRTH:  12/09/1935   DATE OF ADMISSION:  12/06/2005  DATE OF DISCHARGE:                                HISTORY & PHYSICAL   CHIEF COMPLAINT:  Question renal artery stenosis.   HISTORY OF PRESENT ILLNESS:  Ms. Dawn Bradford is a 75 year old woman with  longstanding poorly controlled hypertension and a recent diagnosis of  diabetes mellitus.  She has hypertension which has been difficult to control  despite compliance with five medications. Creatinine has been found to be  moderately elevated at 1.5 given her stage III chronic kidney disease.  Duplex examination at [facility] on August 09, 2005, demonstrated an  atrophic right kidney with preservation of the size of the left kidney.  The  right renal artery was felt to be occluded.  The left renal artery was felt  to likely be normal.  She was referred by Dr. Marval Regal for angiography for  definitive confirmation of these findings.   ALLERGIES:  Multiple drug intolerances:  ACE INHIBITORS have lead to cough.  CODEINE has caused nausea and hives.  NORVASC has caused swollen gums.  She  says CLONIDINE PILLS have made her feel poorly in general and may have  caused some tingling in her hands.   CURRENT MEDICATIONS:  1.  Diovan 320 mg once a day.  2.  Aspirin 81 mg per day.  3.  Vytorin 10/40 one per day.  4.  Toprol XL 100 mg per day.  5.  Prozac 20 mg per day.  6.  Cardizem CD 240 mg per day.  7.  Lasix 40 mg twice per day (held today).  8.  She had stopped taking her clonidine.   PAST MEDICAL HISTORY:  1.  Atherosclerotic coronary artery disease status post anterior myocardial      infarction in 1996 with failed angioplasty of the LAD and subsequently      CABG.  2.  Hypertension, which has been  difficult to control for 30 years.  3.  Diabetes mellitus, recently diagnosed.  4.  Dyslipidemia.  5.  Depression.  6.  Chronic dyspnea.  7.  Overweight.  8.  Status post hysterectomy in 1985, tubal ligation in 1973, right ear      surgery 1979.   SOCIAL HISTORY:  She has three grown daughters and is married.  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  She gets no exercise.  She has never smoked and denies alcohol or illicit  drug use.   FAMILY HISTORY:  Father died at 26 of a ruptured aortic aneurysm.  Mother  died at 62 of complications of diabetes and renal failure.  Brother died at  15 of complications of diabetes and renal failure.  A sister is alive at 87  with diabetes and significant psychiatric illness.   REVIEW OF SYSTEMS:  Chronic  fatigue, wears glasses, exertional dyspnea with  two pillow orthopnea and paroxysmal nocturnal dyspnea over the past six  months.  A 40 pound weight gain over the past year.  Chronic swelling of  both ankles.  Otherwise, negative in detail except as above.   PHYSICAL EXAMINATION:  GENERAL APPEARANCE:  She is an obese woman in no  distress.  VITAL SIGNS:  Heart rate 61, blood pressure 146/72, she is 5 feet 3 inches  tall and weighs 241 pounds.  Oxygen saturation is 92% on room air.  She is  afebrile.  HEENT:  Normal.  SKIN:  Normal.  NODES:  She has no cervical, supraclavicular, or axillary lymphadenopathy.  NECK:  There is no jugular venous distension, no thyromegaly.  LUNGS:  Respiratory effort is normal.  Lungs are clear to auscultation.  CARDIOVASCULAR:  She has a nondisplaced point of maximal cardiac impulse.  There is regular rate and rhythm without murmurs, rubs, or gallops.  There  is a scar consistent with prior median sternotomy.  The sternum is stable.  ABDOMEN:  Obese, soft, nondistended, nontender. I am unable to assess for  hepatosplenomegaly or pulsatile midline mass due to her  habitus but none is  evident.  There is no evident abdominal bruit.  EXTREMITIES:  Warm with 1+ edema bilaterally.  There is no clubbing,  cyanosis, or ulceration.  Carotid pulses are 2+ bilaterally without bruits.  Radial pulses are 2+ bilaterally without bruit.  Femoral pulses are deep and  1+ bilaterally.   IMPRESSION/RECOMMENDATIONS:  Will proceed to renal angiogram as described  above.  Patient will be pretreated with sodium bicarbonate per protocol to  minimize the risk of contrast nephropathy.  Risks including bleeding,  infection, vascular injury and worsened renal function were explained to the  patient.  She agrees to proceed.      Ethelle Lyon, M.D. [facility]  Electronically Signed     WED/MEDQ  D:  12/06/2005  T:  12/06/2005  Job:  [id number]

## 2011-01-22 LAB — BRAIN NATRIURETIC PEPTIDE: Brain Natriuretic Peptide: 130.3 pg/mL — ABNORMAL HIGH (ref 0.0–100.0)

## 2011-01-22 NOTE — Assessment & Plan Note (Addendum)
Dawn Bradford is more volume overloaded today compared to last appointment.  NYHA class III symptoms, also somewhat worse than prior.  Creatinine today is 1.4.  Her Lasix was recently decreased from 40 bid to 40 qday.  Her management will certainly have to be a balance between volume overload and renal function, but she does need more fluid off to function comfortably.  I will increase her Lasix to 40 mg qam, 20 qpm.  Hopefully this will be tolerated without much creatinine bump and will allow some additional diuresis.  She is trying to watch the salt in her diet closely.  I will check a BMET and BNP in 2 wks.

## 2011-01-22 NOTE — Assessment & Plan Note (Signed)
Obesity-hypoventilation syndrome and OSA.  She needs to wear her oxygen more regularly.  She will see Dr. Halford Chessman soon, and I asked her to speak to him about getting into pulmonary rehab.  I am not sure whether or not they offer this at [facility].

## 2011-01-22 NOTE — Progress Notes (Signed)
PCP: Dr. Hoy Morn   This is a 75 year old with history of coronary artery disease status post CABG, resistant hypertension, renal artery stenosis, morbid obesity, diastolic congestive heart failure, CKD, and OHS/OSA who presents to Cardiology Clinic for followup. In 2/12, she was hospitalized at [facility] with overdiuresis and acute renal failure after being put on daily metolozone.  She is using home oxygen with exertion for OHS and CPAP at night for OSA.  She has been dieting and weight is down by 6 lbs since I last saw her.  Earlier this month, her creatinine was noted to have increased to 1.7 and she was told to lower her Lasix to 40 mg daily (was on 40 mg bid).  Since then, she has been more short of breath (dyspneic walking around her house).  She is trying to watch sodium intake carefully.  She has not had any chest pain.  Labs (9/10): creatinine 1.4, HCT 41.7, LDL 68, HDL 42, hgbA1c 8.1%  Labs (4/11): BNP 82, LDL 62  Labs (12/23/09): K 4, creatinine 1.24  Labs (6/11): K 4.2, creatinine 1.2, BNP 94  Labs (7/11): LDL 60, HDL 40, LFTs normal  Labs (8/11): K 4.2, creatinine 1.25, HCT 40  Labs (11/11): K 4.5, creatinine 1.36  Labs (1/12): LDL 73, HDL 41, LFTs normal  Labs (3/12): TSH normal, HCT 36, K 3.9, creatinine 1.3, HDL 43, LDL 45  Labs (5/12): creatinine 1.7 => 1.4, K 4.1, BNP 130   Allergies (verified):  1) ! Norvasc  2) ! Codeine  3) ! Clonidine Hcl  4) ! Ace Inhibitors  5) * Metformin   Family History:  Father - CAD/MI, AAA age 53  Mother - DM, Renal failure age 71  Brother - DM, Renal failure age 35  Sister - DM   Social History:  Married, 3 daughters. No history of smoking or alcohol use. Works part time with [employer].   Past Medical History:  1. Hyperlipidemia.  2. Resistant hypertension times many years. The patient does have renal artery stenosis. She has tried calcium channel blockers in the past and states that she would not take them now because she had some problems  with her gums which her dentist identified as calcium-channel blocker side effects.  3. Type 2 diabetes.  4. Coronary artery disease, status post anterior MI in 1996 followed by coronary artery bypass grafting. Lexiscan myoview (4/11): EF 67%, normal perfusion with no evidence for ischemia or infarction.  5. Renal artery stenosis. The patient has an occluded right renal artery and an atrophic right kidney. There is 20% left renal artery stenosis. This was seen by catheterization in 2007.  6. History of diastolic congestive heart failure. Most recent echo (12/10) showed EF 55-60% with mild LVH, grade I diastolic dysfunction, mild LAE.  7. Aortic valve disorder: Echo in 2009 showed mean aortic valve gradient of 13 mmHg, suggesting very mild stenosis. Echo (12/10) suggested aortic sclerosis only.  8. Obesity.  9.Chronic kidney disease. Most recent creatinine was 1.3. Patient had acute on chronic renal failure in 2/12, likely due to use of daily metolozone.  10. Obstructive sleep apnea  - PSG 01/05/06 AHI 24.8  - CPAP 9 cm H2O  11. Mild asthma  - PFT 02/05/10 FEV1 1.38(&&%), FEV1% 73, TLC 3.78(86%), DLCO 48%, +BD  12. ACE inhibitor cough  13. Hypothyroidism  14. Hypoxemia likely from Obesity-Hypoventilation syndrome  - SpO2 83% with exertion  - 2 liters oxygen 24/7  15. Pulmonary nodule Rt upper lobe, borderline  mediastinal adenopathy  - CT chest June 2011  16. V/Q scan (5/11): low probability of PE.  17. Carotid dopplers (4/11): Mild disease only.   ROS: All systems reviewed and negative except as per HPI.   Current Outpatient Prescriptions  Medication Sig Dispense Refill  . aspirin 81 MG EC tablet Take 81 mg by mouth daily.        . beclomethasone (QVAR) 80 MCG/ACT inhaler Inhale 1 puff into the lungs as needed.        . calcitRIOL (ROCALTROL) 0.25 MCG capsule Take 0.25 mcg by mouth every other day. M,w,f       . carvedilol (COREG) 25 MG tablet Take 25 mg by mouth 2 (two) times daily with  a meal.        . co-enzyme Q-10 30 MG capsule Take 30 mg by mouth daily.        . ferrous fumarate-iron polysaccharide complex (TANDEM) 162-115.2 MG CAPS Take 1 capsule by mouth daily with breakfast.        . FLUoxetine (PROZAC) 40 MG capsule Take 40 mg by mouth daily.        . furosemide (LASIX) 40 MG tablet Take 1 tablet (40 mg total) by mouth as directed. Take 40mg  in AM, 20mg  in PM  45 tablet  6  . hydrALAZINE (APRESOLINE) 25 MG tablet Take 25 mg by mouth 3 (three) times daily.        . insulin glargine (LANTUS) 100 UNIT/ML injection Inject 50 Units into the skin at bedtime.       [name] levothyroxine (SYNTHROID, LEVOTHROID) 50 MCG tablet Take 50 mcg by mouth daily.        [name] liothyronine (CYTOMEL) 5 MCG tablet Take 5 mcg by mouth 2 (two) times daily.       . rosuvastatin (CRESTOR) 40 MG tablet Take 40 mg by mouth daily.        [name] telmisartan (MICARDIS) 80 MG tablet Take 80 mg by mouth daily.        . traZODone (DESYREL) 50 MG tablet Take 50 mg by mouth daily as needed.        . polyethylene glycol (MIRALAX / GLYCOLAX) packet Take 17 g by mouth daily as needed.          BP 140/80  Pulse 72  Ht 5\' 2"  (1.575 m)  Wt 251 lb (113.853 kg)  BMI 45.91 kg/m2 General: Well developed, well nourished, in no acute distress. Obese.  Neck: Neck supple, JVP 10-12 cm. No masses, thyromegaly or abnormal cervical nodes.  Lungs: Clear bilaterally to auscultation and percussion.  Heart: Non-displaced PMI, chest non-tender; regular rate and rhythm, S1, S2 without rubs or gallops. 2/6 SEM RUSB.  Carotid upstroke normal, no bruit. 2+ edema to knees bilaterally.  Abdomen: Bowel sounds positive; abdomen soft and non-tender without masses, organomegaly, or hernias noted. No hepatosplenomegaly.  Extremities: No clubbing or cyanosis.  Neurologic: Alert and oriented x 3.  Psych: Normal affect.

## 2011-01-22 NOTE — Assessment & Plan Note (Signed)
Recent normal myoview.  Continue ASA, Crestor, Coreg, ARB.

## 2011-01-22 NOTE — Assessment & Plan Note (Signed)
Lipids at goal (< 70) when last checked.

## 2011-01-24 ENCOUNTER — Ambulatory Visit (INDEPENDENT_AMBULATORY_CARE_PROVIDER_SITE_OTHER): Payer: Medicare Other | Admitting: Pulmonary Disease

## 2011-01-24 ENCOUNTER — Encounter: Payer: Self-pay | Admitting: Pulmonary Disease

## 2011-01-24 DIAGNOSIS — G4733 Obstructive sleep apnea (adult) (pediatric): Secondary | ICD-10-CM

## 2011-01-24 DIAGNOSIS — R0902 Hypoxemia: Secondary | ICD-10-CM

## 2011-01-24 DIAGNOSIS — R0602 Shortness of breath: Secondary | ICD-10-CM

## 2011-01-24 DIAGNOSIS — J984 Other disorders of lung: Secondary | ICD-10-CM

## 2011-01-24 DIAGNOSIS — J45909 Unspecified asthma, uncomplicated: Secondary | ICD-10-CM

## 2011-01-24 MED ORDER — BECLOMETHASONE DIPROPIONATE 80 MCG/ACT IN AERS: INHALATION_SPRAY | RESPIRATORY_TRACT | Status: DC

## 2011-01-24 NOTE — Assessment & Plan Note (Addendum)
She is tolerating CPAP well.  No change to set up.

## 2011-01-24 NOTE — Assessment & Plan Note (Addendum)
Will need f/u CT chest in November 2012.

## 2011-01-24 NOTE — Assessment & Plan Note (Signed)
She has been doing well.  Will see if she can taper her inhaler regimen down.  She is to call if she has trouble with this.

## 2011-01-24 NOTE — Assessment & Plan Note (Addendum)
Related to OHS/OSA, diastolic CHF, asthma, obesity/deconditioning.  She now understands the importance of using oxygen 24/7.  Will see if she can be set up for pulmonary rehab in [place].

## 2011-01-24 NOTE — Progress Notes (Signed)
Subjective:    Patient ID: Dawn Bradford, female    DOB: 1936-07-15, 75 y.o.   MRN: [id number] CC: Dellis Filbert  HPI 75 yo female with multifactorial dyspnea, asthma, OSA/OHS, deconditioning, diastolic dysfunction, and pulmonary nodule.  She was recently hospitalized for low sodium which caused confusion and falls.  This is now better.    She was not wearing her oxygen all the time, but is now doing so.  She is doing well with CPAP, and has no problem with her mask.  She feels this is helping her sleep and energy level.  Her breathing is better than it has been in some time.  She is not needing to use proair recently.  She is not having much cough, wheeze, or sputum.  She still gets winded very easily with any activity.  She is nervous about doing to much because she is afraid of what will happen with her breathing if she exerts herself too much.  Past Medical History  Diagnosis Date  . Hyperlipidemia   . Hypertension     resistant hyptertension times many years. the patient does have renal artery stenosis. she has tried calcium channel blockers in the past and states that she would not take them now because she had some problems with her gums which her dentist identified as calcium-channel blocker side effects.  . Diabetes mellitus     type 2  . Coronary artery disease     s/p anterior MI in 1996 followed by CABG. Lexiscan myoview (4/11): EF 67%, normal perfusion with no evidence for ischemia or infarction.   . Renal artery stenosis     The patient has an occluded right renal artery and an atrophic right kidney. There is 20% left renal artery stenosis. This was seen by catheterization in 2007  . Diastolic heart failure     most recent echo (12/10) showed EF 55-60% with mild LVH, grade I diastolic dysfunction, mild LAE.  [name] Aortic valve disorder     Echo in 2009 showed mean aortic valve gradient of 13 mmHg, suggesting very mild stenosis. Echo (12/10) suggested aortic  sclerosis only  . Obesity   . Chronic kidney disease     most recent creatinine was 1.2  . Obstructive sleep apnea     PSG 01/05/2006 AHI 24.8, CPAP 9cm H2O  . Mild asthma     PFT 02/05/10 FEV1 1.38, FEV1% 73, TLC 3.78 (86%), DLCO 48%, +BD  . ACE-inhibitor cough   . Hypothyroidism   . Obesity hypoventilation syndrome        . Pulmonary nodule, right   . Carotid artery disease     mild, carotid dopplers 12/2009     Family History  Problem Relation Age of Onset  . Kidney failure Mother   . Diabetes Mother   . Aortic aneurysm Father   . Coronary artery disease Father   . Heart attack Father   . Diabetes Sister   . Diabetes Brother   . Kidney failure Brother      History   Social History  . Marital Status: Married    Spouse Name: N/A    Number of Children: N/A  . Years of Education: N/A   Occupational History  . Not on file.   Social History Main Topics  . Smoking status: Never Smoker   . Smokeless tobacco: Never Used  . Alcohol Use: No  . Drug Use: No  . Sexually Active:    Other Topics Concern  .  Not on file   Social History Narrative  . No narrative on file     Allergies  Allergen Reactions  . Ace Inhibitors     unknown  . Amlodipine Besylate     "something happened to gums"  . Clonidine Hydrochloride   . Codeine Hives  . Metformin     REACTION: vomiting     Outpatient Prescriptions Prior to Visit  Medication Sig Dispense Refill  . aspirin 81 MG EC tablet Take 81 mg by mouth daily.        . calcitRIOL (ROCALTROL) 0.25 MCG capsule Take 0.25 mcg by mouth every other day. M,w,f       . carvedilol (COREG) 25 MG tablet Take 25 mg by mouth 2 (two) times daily with a meal.        . co-enzyme Q-10 30 MG capsule 2 capsules once daily      . FLUoxetine (PROZAC) 40 MG capsule Take 40 mg by mouth daily.        . furosemide (LASIX) 40 MG tablet Take 1 tablet (40 mg total) by mouth as directed. Take 40mg  in AM, 20mg  in PM  45 tablet  6  . hydrALAZINE (APRESOLINE)  25 MG tablet Take 25 mg by mouth 3 (three) times daily.        . insulin glargine (LANTUS) 100 UNIT/ML injection Inject 50 Units into the skin at bedtime.       [name] levothyroxine (SYNTHROID, LEVOTHROID) 50 MCG tablet Take 50 mcg by mouth daily.        [name] liothyronine (CYTOMEL) 5 MCG tablet Take 5 mcg by mouth 2 (two) times daily.       . polyethylene glycol (MIRALAX / GLYCOLAX) packet Take 17 g by mouth daily as needed.        . rosuvastatin (CRESTOR) 40 MG tablet Take 40 mg by mouth daily.        [name] telmisartan (MICARDIS) 80 MG tablet Take 80 mg by mouth daily.        . traZODone (DESYREL) 50 MG tablet Take 50 mg by mouth daily as needed.        . beclomethasone (QVAR) 80 MCG/ACT inhaler Inhale 1 puff into the lungs as needed.        . ferrous fumarate-iron polysaccharide complex (TANDEM) 162-115.2 MG CAPS Take 1 capsule by mouth daily with breakfast.         Review of Systems     Objective:   Physical Exam Filed Vitals:   01/24/11 1523  BP: 130/80  Pulse: 66  Temp: 98 F (36.7 C)  TempSrc: Oral  Height: 5\' 2"  (1.575 m)  Weight: 253 lb (114.76 kg)  SpO2: 95%    General: on supplemental oxygen and obese.  Nose: no deformity, discharge, inflammation, or lesions  Mouth: MP 4, no exudate  Neck: no JVD.  Lungs: diminished breath sounds, no wheezing or rales  Heart: regular rhythm, normal rate, and no murmurs.  Extremities: minimal ankle edema  Neurologic: normal CN II-XII and strength normal.  Cervical Nodes: no significant adenopathy  Psych: anxious.     Assessment & Plan:   SLEEP APNEA, OBSTRUCTIVE She is tolerating CPAP well.  No change to set up.  PULMONARY NODULE Will need f/u CT chest in November 2012.  ASTHMA She has been doing well.  Will see if she can taper her inhaler regimen down.  She is to call if she has trouble with this.  Shortness of breath Related to  OHS/OSA, diastolic CHF, asthma, obesity/deconditioning.  She now understands the importance of using  oxygen 24/7.  Will see if she can be set up for pulmonary rehab in [place].  Obesity hypoventilation syndrome She is to continue with 24/7 oxygen and CPAP at night.  Hopefully pulmonary rehab will improve her exercise capacity and assist with weight loss.    Updated Medication List Outpatient Encounter Prescriptions as of 01/24/2011  Medication Sig Dispense Refill  . aspirin 81 MG EC tablet Take 81 mg by mouth daily.        . beclomethasone (QVAR) 80 MCG/ACT inhaler One puff in morning and two puffs at night for two weeks.  If okay, then use two puffs at night.  1 Inhaler    . calcitRIOL (ROCALTROL) 0.25 MCG capsule Take 0.25 mcg by mouth every other day. M,w,f       . carvedilol (COREG) 25 MG tablet Take 25 mg by mouth 2 (two) times daily with a meal.        . co-enzyme Q-10 30 MG capsule 2 capsules once daily      . FLUoxetine (PROZAC) 40 MG capsule Take 40 mg by mouth daily.        . furosemide (LASIX) 40 MG tablet Take 1 tablet (40 mg total) by mouth as directed. Take 40mg  in AM, 20mg  in PM  45 tablet  6  . hydrALAZINE (APRESOLINE) 25 MG tablet Take 25 mg by mouth 3 (three) times daily.        . insulin glargine (LANTUS) 100 UNIT/ML injection Inject 50 Units into the skin at bedtime.       [name] levothyroxine (SYNTHROID, LEVOTHROID) 50 MCG tablet Take 50 mcg by mouth daily.        [name] liothyronine (CYTOMEL) 5 MCG tablet Take 5 mcg by mouth 2 (two) times daily.       . polyethylene glycol (MIRALAX / GLYCOLAX) packet Take 17 g by mouth daily as needed.        . rosuvastatin (CRESTOR) 40 MG tablet Take 40 mg by mouth daily.        [name] telmisartan (MICARDIS) 80 MG tablet Take 80 mg by mouth daily.        . traZODone (DESYREL) 50 MG tablet Take 50 mg by mouth daily as needed.        [name] DISCONTD: beclomethasone (QVAR) 80 MCG/ACT inhaler Inhale 1 puff into the lungs as needed.        . ferrous fumarate-iron polysaccharide complex (TANDEM) 162-115.2 MG CAPS Take 1 capsule by mouth daily with breakfast.

## 2011-01-24 NOTE — Patient Instructions (Signed)
Try Qvar one puff in the morning, and two puffs at night.  If breathing is okay, then use Qvar two puffs at night. Will arrange for pulmonary rehab Follow up in 4 months

## 2011-01-25 ENCOUNTER — Encounter: Payer: Self-pay | Admitting: Pulmonary Disease

## 2011-01-25 NOTE — Assessment & Plan Note (Signed)
She is to continue with 24/7 oxygen and CPAP at night.  Hopefully pulmonary rehab will improve her exercise capacity and assist with weight loss.

## 2011-02-04 ENCOUNTER — Other Ambulatory Visit (INDEPENDENT_AMBULATORY_CARE_PROVIDER_SITE_OTHER): Payer: Medicare Other | Admitting: *Deleted

## 2011-02-04 DIAGNOSIS — I5032 Chronic diastolic (congestive) heart failure: Secondary | ICD-10-CM

## 2011-02-04 DIAGNOSIS — I25810 Atherosclerosis of coronary artery bypass graft(s) without angina pectoris: Secondary | ICD-10-CM

## 2011-02-04 DIAGNOSIS — R0602 Shortness of breath: Secondary | ICD-10-CM

## 2011-02-04 LAB — BASIC METABOLIC PANEL
CO2: 25 mEq/L (ref 19–32)
Calcium: 9.1 mg/dL (ref 8.4–10.5)
Chloride: 100 mEq/L (ref 96–112)
Creat: 1.73 mg/dL — ABNORMAL HIGH (ref 0.50–1.10)
Sodium: 135 mEq/L (ref 135–145)

## 2011-02-07 ENCOUNTER — Other Ambulatory Visit: Payer: Self-pay | Admitting: Cardiology

## 2011-02-07 MED ORDER — FUROSEMIDE 40 MG PO TABS: 40.0000 mg | ORAL_TABLET | ORAL | Status: DC

## 2011-02-15 ENCOUNTER — Encounter: Payer: Medicare Other | Admitting: Pulmonary Disease

## 2011-02-17 ENCOUNTER — Other Ambulatory Visit (INDEPENDENT_AMBULATORY_CARE_PROVIDER_SITE_OTHER): Payer: Medicare Other | Admitting: *Deleted

## 2011-02-17 DIAGNOSIS — Z79899 Other long term (current) drug therapy: Secondary | ICD-10-CM

## 2011-02-17 LAB — BASIC METABOLIC PANEL
BUN: 32 mg/dL — ABNORMAL HIGH (ref 6–23)
Chloride: 100 mEq/L (ref 96–112)
Potassium: 4.3 mEq/L (ref 3.5–5.3)
Sodium: 134 mEq/L — ABNORMAL LOW (ref 135–145)

## 2011-03-06 ENCOUNTER — Encounter: Payer: Medicare Other | Admitting: Pulmonary Disease

## 2011-03-29 ENCOUNTER — Ambulatory Visit (INDEPENDENT_AMBULATORY_CARE_PROVIDER_SITE_OTHER): Payer: Medicare Other | Admitting: Cardiology

## 2011-03-29 ENCOUNTER — Encounter: Payer: Self-pay | Admitting: Cardiology

## 2011-03-29 VITALS — BP 127/71 | HR 75 | Ht 62.0 in | Wt 248.0 lb

## 2011-03-29 DIAGNOSIS — N289 Disorder of kidney and ureter, unspecified: Secondary | ICD-10-CM

## 2011-03-29 DIAGNOSIS — R0602 Shortness of breath: Secondary | ICD-10-CM

## 2011-03-29 DIAGNOSIS — Z79899 Other long term (current) drug therapy: Secondary | ICD-10-CM

## 2011-03-29 DIAGNOSIS — I10 Essential (primary) hypertension: Secondary | ICD-10-CM

## 2011-03-29 DIAGNOSIS — I5032 Chronic diastolic (congestive) heart failure: Secondary | ICD-10-CM

## 2011-03-29 DIAGNOSIS — E785 Hyperlipidemia, unspecified: Secondary | ICD-10-CM

## 2011-03-29 DIAGNOSIS — I509 Heart failure, unspecified: Secondary | ICD-10-CM

## 2011-03-29 DIAGNOSIS — I25810 Atherosclerosis of coronary artery bypass graft(s) without angina pectoris: Secondary | ICD-10-CM

## 2011-03-29 LAB — BASIC METABOLIC PANEL
BUN: 25 mg/dL — ABNORMAL HIGH (ref 6–23)
CO2: 27 mEq/L (ref 19–32)
Glucose, Bld: 165 mg/dL — ABNORMAL HIGH (ref 70–99)
Potassium: 4.3 mEq/L (ref 3.5–5.3)
Sodium: 135 mEq/L (ref 135–145)

## 2011-03-29 NOTE — Progress Notes (Signed)
PCP: Dr. Hoy Morn   This is a 75 year old with history of coronary artery disease status post CABG, resistant hypertension, renal artery stenosis, morbid obesity, diastolic congestive heart failure, CKD, and OHS/OSA who presents to Cardiology Clinic for followup. In 2/12, she was hospitalized at [facility] with overdiuresis and acute renal failure after being put on daily metolozone.  She is using home oxygen with exertion for OHS and CPAP at night for OSA.    She is now in pulmonary rehab and feels that her breathing is better.  She has lost 3 lbs since last appointment.  Creatinine has been stable.  She is walking on a treadmill at rehab without dyspnea.  For the most part, she does well walking on flat ground now but still gets winded with steps.  No chest pain.  Blood pressure is 127/71 today.    ECG: NSR, normal  Labs (8/11): K 4.2, creatinine 1.25, HCT 40  Labs (11/11): K 4.5, creatinine 1.36  Labs (1/12): LDL 73, HDL 41, LFTs normal  Labs (3/12): TSH normal, HCT 36, K 3.9, creatinine 1.3, HDL 43, LDL 45  Labs (5/12): creatinine 1.7 => 1.4, K 4.1, BNP 130 Labs (6/12): creatinine 1.66, K 4.3  Allergies (verified):  1) ! Norvasc  2) ! Codeine  3) ! Clonidine Hcl  4) ! Ace Inhibitors  5) * Metformin   Family History:  Father - CAD/MI, AAA age 75  Mother - DM, Renal failure age 71  Brother - DM, Renal failure age 67  Sister - DM   Social History:  Married, 3 daughters. No history of smoking or alcohol use. Works part time with [employer].   Past Medical History:  1. Hyperlipidemia.  2. Resistant hypertension times many years. The patient does have renal artery stenosis. She has tried calcium channel blockers in the past and states that she would not take them now because she had some problems with her gums which her dentist identified as calcium-channel blocker side effects.  3. Type 2 diabetes.  4. Coronary artery disease, status post anterior MI in 1996 followed by coronary artery  bypass grafting. Lexiscan myoview (4/11): EF 67%, normal perfusion with no evidence for ischemia or infarction.  5. Renal artery stenosis. The patient has an occluded right renal artery and an atrophic right kidney. There is 20% left renal artery stenosis. This was seen by catheterization in 2007.  6. History of diastolic congestive heart failure. Most recent echo (12/10) showed EF 55-60% with mild LVH, grade I diastolic dysfunction, mild LAE.  7. Aortic valve disorder: Echo in 2009 showed mean aortic valve gradient of 13 mmHg, suggesting very mild stenosis. Echo (12/10) suggested aortic sclerosis only.  8. Obesity.  9.Chronic kidney disease. Most recent creatinine was 1.3. Patient had acute on chronic renal failure in 2/12, likely due to use of daily metolozone.  10. Obstructive sleep apnea  - PSG 01/05/06 AHI 24.8  - CPAP 9 cm H2O  11. Mild asthma  - PFT 02/05/10 FEV1 1.38(&&%), FEV1% 73, TLC 3.78(86%), DLCO 48%, +BD  12. ACE inhibitor cough  13. Hypothyroidism  14. Hypoxemia likely from Obesity-Hypoventilation syndrome  - SpO2 83% with exertion  - 2 liters oxygen 24/7  15. Pulmonary nodule Rt upper lobe, borderline mediastinal adenopathy  - CT chest June 2011  16. V/Q scan (5/11): low probability of PE.  17. Carotid dopplers (4/11): Mild disease only.   ROS: All systems reviewed and negative except as per HPI.   Current Outpatient Prescriptions  Medication Sig Dispense Refill  . 5-Hydroxytryptophan (5-HTP PO) Take by mouth. As directed        . aspirin 81 MG EC tablet Take 81 mg by mouth daily.        . beclomethasone (QVAR) 80 MCG/ACT inhaler One puff in morning and two puffs at night for two weeks.  If okay, then use two puffs at night.  1 Inhaler    . carvedilol (COREG) 25 MG tablet Take 25 mg by mouth 2 (two) times daily with a meal.        . Cholecalciferol (D 5000 PO) Take by mouth as directed.        . co-enzyme Q-10 30 MG capsule 2 capsules once daily      . ferrous  fumarate-iron polysaccharide complex (TANDEM) 162-115.2 MG CAPS Take 1 capsule by mouth daily with breakfast.        . FLUoxetine (PROZAC) 40 MG capsule Take 40 mg by mouth daily.        . furosemide (LASIX) 40 MG tablet Take 1 tablet (40 mg total) by mouth as directed. Alternate 40mg  once daily with 40mg  AM and 20mg  PM.  45 tablet  6  . hydrALAZINE (APRESOLINE) 25 MG tablet Take 25 mg by mouth 3 (three) times daily.        . insulin glargine (LANTUS) 100 UNIT/ML injection Inject 50 Units into the skin at bedtime.       [name] levothyroxine (SYNTHROID, LEVOTHROID) 50 MCG tablet Take 50 mcg by mouth daily.        [name] liothyronine (CYTOMEL) 5 MCG tablet Take 5 mcg by mouth 2 (two) times daily.       . polyethylene glycol (MIRALAX / GLYCOLAX) packet Take 17 g by mouth daily as needed.        . rosuvastatin (CRESTOR) 40 MG tablet Take 40 mg by mouth daily.        [name] telmisartan (MICARDIS) 80 MG tablet Take 80 mg by mouth daily.        . traZODone (DESYREL) 50 MG tablet Take 50 mg by mouth daily as needed.          BP 127/71  Pulse 75  Ht 5\' 2"  (1.575 m)  Wt 248 lb (112.492 kg)  BMI 45.36 kg/m2 General: Well developed, well nourished, in no acute distress. Obese.  Neck: Neck supple, JVP 8 cm. No masses, thyromegaly or abnormal cervical nodes.  Lungs: Clear bilaterally to auscultation and percussion.  Heart: Non-displaced PMI, chest non-tender; regular rate and rhythm, S1, S2 without rubs or gallops. 2/6 SEM RUSB.  Carotid upstroke normal, no bruit. 1+ edema 1/2 to knees bilaterally.  Abdomen: Bowel sounds positive; abdomen soft and non-tender without masses, organomegaly, or hernias noted. No hepatosplenomegaly.  Extremities: No clubbing or cyanosis.  Neurologic: Alert and oriented x 3.  Psych: Normal affect.

## 2011-03-29 NOTE — Assessment & Plan Note (Signed)
Patient needs lipids/LFTs in 9/12.  Goal LDL < 70.

## 2011-03-29 NOTE — Assessment & Plan Note (Signed)
Recent normal myoview.  Continue ASA, Crestor, Coreg, ARB.

## 2011-03-29 NOTE — Assessment & Plan Note (Signed)
Dawn Bradford is mildly volume overloaded on exam today but appears better than at prior appointment.  Weight is down 3 lbs.  Breathing is better.  I am going to leave her on the current Lasix regimen.  Will get BMET/BNP today.

## 2011-03-29 NOTE — Assessment & Plan Note (Signed)
Creatinine has been around 1.6-1.7 recently.  As above, will get BMET today.

## 2011-03-29 NOTE — Assessment & Plan Note (Signed)
BP appears well-controlled today.

## 2011-03-29 NOTE — Patient Instructions (Signed)
Continue with current medications. Follow up with Dr. Aundra Dubin in 3 months. Liver/Lipid/BMET in September 2012.

## 2011-03-30 LAB — BRAIN NATRIURETIC PEPTIDE: Brain Natriuretic Peptide: 162.1 pg/mL — ABNORMAL HIGH (ref 0.0–100.0)

## 2011-04-06 ENCOUNTER — Encounter: Payer: Medicare Other | Admitting: Pulmonary Disease

## 2011-05-07 ENCOUNTER — Encounter: Payer: Medicare Other | Admitting: Pulmonary Disease

## 2011-05-10 ENCOUNTER — Ambulatory Visit (INDEPENDENT_AMBULATORY_CARE_PROVIDER_SITE_OTHER): Payer: Medicare Other | Admitting: *Deleted

## 2011-05-10 DIAGNOSIS — E785 Hyperlipidemia, unspecified: Secondary | ICD-10-CM

## 2011-05-10 DIAGNOSIS — R0602 Shortness of breath: Secondary | ICD-10-CM

## 2011-05-10 DIAGNOSIS — I509 Heart failure, unspecified: Secondary | ICD-10-CM

## 2011-05-10 DIAGNOSIS — I5032 Chronic diastolic (congestive) heart failure: Secondary | ICD-10-CM

## 2011-05-10 DIAGNOSIS — Z79899 Other long term (current) drug therapy: Secondary | ICD-10-CM

## 2011-05-10 LAB — BASIC METABOLIC PANEL
BUN: 27 mg/dL — ABNORMAL HIGH (ref 6–23)
CO2: 23 mEq/L (ref 19–32)
Chloride: 104 mEq/L (ref 96–112)
Creat: 1.54 mg/dL — ABNORMAL HIGH (ref 0.50–1.10)
Potassium: 4.5 mEq/L (ref 3.5–5.3)

## 2011-05-10 LAB — HEPATIC FUNCTION PANEL
Alkaline Phosphatase: 72 U/L (ref 39–117)
Bilirubin, Direct: 0.1 mg/dL (ref 0.0–0.3)
Indirect Bilirubin: 0.4 mg/dL (ref 0.0–0.9)
Total Bilirubin: 0.5 mg/dL (ref 0.3–1.2)

## 2011-05-10 LAB — LIPID PANEL
LDL Cholesterol: 65 mg/dL (ref 0–99)
VLDL: 16 mg/dL (ref 0–40)

## 2011-05-31 ENCOUNTER — Ambulatory Visit (INDEPENDENT_AMBULATORY_CARE_PROVIDER_SITE_OTHER): Payer: Medicare Other | Admitting: Pulmonary Disease

## 2011-05-31 ENCOUNTER — Encounter: Payer: Self-pay | Admitting: Pulmonary Disease

## 2011-05-31 VITALS — BP 138/72 | HR 65 | Temp 97.7°F | Ht 63.0 in | Wt 253.4 lb

## 2011-05-31 DIAGNOSIS — R0602 Shortness of breath: Secondary | ICD-10-CM

## 2011-05-31 DIAGNOSIS — J45909 Unspecified asthma, uncomplicated: Secondary | ICD-10-CM

## 2011-05-31 DIAGNOSIS — E662 Morbid (severe) obesity with alveolar hypoventilation: Secondary | ICD-10-CM

## 2011-05-31 DIAGNOSIS — Z23 Encounter for immunization: Secondary | ICD-10-CM

## 2011-05-31 DIAGNOSIS — G4733 Obstructive sleep apnea (adult) (pediatric): Secondary | ICD-10-CM

## 2011-05-31 DIAGNOSIS — J984 Other disorders of lung: Secondary | ICD-10-CM

## 2011-05-31 NOTE — Assessment & Plan Note (Signed)
She is tolerating CPAP well.  No change to set up.

## 2011-05-31 NOTE — Assessment & Plan Note (Signed)
She is to continue with 24/7 oxygen and CPAP at night.

## 2011-05-31 NOTE — Progress Notes (Signed)
Subjective:    Patient ID: Dawn Bradford, female    DOB: October 24, 1935, 75 y.o.   MRN: [id number]  HPI CC: [name]   75 yo female with multifactorial dyspnea, asthma, OSA/OHS, deconditioning, diastolic dysfunction, and pulmonary nodule.  She has been doing well.  She finished initial phase of pulmonary rehab and felt this helped.  She has been using her oxygen more, but not all the time.  She notices she can't do as much when she is not using oxygen.  She denies cough, wheeze, or sputum.  Her leg swelling has improved.  She is doing well with CPAP.  Past Medical History  Diagnosis Date  . Hyperlipidemia   . Hypertension     resistant hyptertension times many years. the patient does have renal artery stenosis. she has tried calcium channel blockers in the past and states that she would not take them now because she had some problems with her gums which her dentist identified as calcium-channel blocker side effects.  . Diabetes mellitus     type 2  . Coronary artery disease     s/p anterior MI in 1996 followed by CABG. Lexiscan myoview (4/11): EF 67%, normal perfusion with no evidence for ischemia or infarction.   . Renal artery stenosis     The patient has an occluded right renal artery and an atrophic right kidney. There is 20% left renal artery stenosis. This was seen by catheterization in 2007  . Diastolic heart failure     most recent echo (12/10) showed EF 55-60% with mild LVH, grade I diastolic dysfunction, mild LAE.  [name] Aortic valve disorder     Echo in 2009 showed mean aortic valve gradient of 13 mmHg, suggesting very mild stenosis. Echo (12/10) suggested aortic sclerosis only  . Obesity   . Chronic kidney disease     most recent creatinine was 1.2  . Obstructive sleep apnea     PSG 01/05/2006 AHI 24.8, CPAP 9cm H2O  . Mild asthma     PFT 02/05/10 FEV1 1.38, FEV1% 73, TLC 3.78 (86%), DLCO 48%, +BD  . ACE-inhibitor cough   . Hypothyroidism   . Obesity  hypoventilation syndrome        . Pulmonary nodule, right   . Carotid artery disease     mild, carotid dopplers 12/2009     Family History  Problem Relation Age of Onset  . Kidney failure Mother   . Diabetes Mother   . Aortic aneurysm Father   . Coronary artery disease Father   . Heart attack Father   . Diabetes Sister   . Diabetes Brother   . Kidney failure Brother      History   Social History  . Marital Status: Married   Social History Main Topics  . Smoking status: Never Smoker   . Smokeless tobacco: Never Used  . Alcohol Use: No  . Drug Use: No  . Sexually Active:    Allergies  Allergen Reactions  . Ace Inhibitors     unknown  . Amlodipine Besylate     "something happened to gums"  . Clonidine Hydrochloride   . Codeine Hives  . Metformin     REACTION: vomiting    Review of Systems     Objective:   Physical Exam  BP 138/72  Pulse 65  Temp(Src) 97.7 F (36.5 C) (Oral)  Ht 5\' 3"  (1.6 m)  Wt 253 lb 6.4 oz (114.941 kg)  BMI 44.89 kg/m2  SpO2  91%  General: on supplemental oxygen and obese.  Nose: no deformity, discharge, inflammation, or lesions  Mouth: MP 4, no exudate  Neck: no JVD.  Lungs: diminished breath sounds, no wheezing or rales  Heart: regular rhythm, normal rate, and no murmurs.  Extremities: minimal ankle edema  Neurologic: normal CN II-XII and strength normal.  Cervical Nodes: no significant adenopathy  Psych: normal mood/behavior     Assessment & Plan:   PULMONARY NODULE Will need f/u CT chest in November 2012.    ASTHMA She has been doing well.  Continue Qvar.    SLEEP APNEA, OBSTRUCTIVE She is tolerating CPAP well.  No change to set up.    Obesity hypoventilation syndrome She is to continue with 24/7 oxygen and CPAP at night.    Shortness of breath Related to OHS/OSA, diastolic CHF, asthma, obesity/deconditioning.  She has done well with pulmonary rehab, and will enroll in follow up classes.      Updated  Medication List Outpatient Encounter Prescriptions as of 05/31/2011  Medication Sig Dispense Refill  . aspirin 81 MG EC tablet Take 81 mg by mouth daily.        . beclomethasone (QVAR) 80 MCG/ACT inhaler One puff in morning and two puffs at night for two weeks.  If okay, then use two puffs at night.  1 Inhaler    . carvedilol (COREG) 25 MG tablet Take 25 mg by mouth 2 (two) times daily with a meal.        . Cholecalciferol (D 5000 PO) Take by mouth as directed.        . co-enzyme Q-10 30 MG capsule 2 capsules once daily      . ferrous fumarate-iron polysaccharide complex (TANDEM) 162-115.2 MG CAPS Take 1 capsule by mouth daily with breakfast.        . FLUoxetine (PROZAC) 40 MG capsule Take 40 mg by mouth daily.        . furosemide (LASIX) 40 MG tablet Take 1 tablet (40 mg total) by mouth as directed. Alternate 40mg  once daily with 40mg  AM and 20mg  PM.  45 tablet  6  . hydrALAZINE (APRESOLINE) 25 MG tablet Take 25 mg by mouth 3 (three) times daily.        . insulin glargine (LANTUS) 100 UNIT/ML injection Inject 50 Units into the skin at bedtime.       [name] levothyroxine (SYNTHROID, LEVOTHROID) 50 MCG tablet Take 50 mcg by mouth daily.        [name] liothyronine (CYTOMEL) 5 MCG tablet Take 5 mcg by mouth 2 (two) times daily.       . polyethylene glycol (MIRALAX / GLYCOLAX) packet Take 17 g by mouth daily as needed.        . rosuvastatin (CRESTOR) 40 MG tablet Take 40 mg by mouth daily.        [name] telmisartan (MICARDIS) 80 MG tablet Take 80 mg by mouth daily.        . traZODone (DESYREL) 50 MG tablet Take 50 mg by mouth daily as needed.        . triamcinolone (KENALOG) 0.5 % cream as needed.      [name] 5-Hydroxytryptophan (5-HTP PO) Take by mouth. As directed

## 2011-05-31 NOTE — Assessment & Plan Note (Signed)
She has been doing well.  Continue Qvar.

## 2011-05-31 NOTE — Patient Instructions (Signed)
Flu shot today Will schedule CT chest for November 2012 Follow up after CT chest in November 2012

## 2011-05-31 NOTE — Assessment & Plan Note (Signed)
Related to OHS/OSA, diastolic CHF, asthma, obesity/deconditioning.  She has done well with pulmonary rehab, and will enroll in follow up classes.

## 2011-05-31 NOTE — Assessment & Plan Note (Signed)
Will need f/u CT chest in November 2012.

## 2011-06-15 ENCOUNTER — Encounter: Payer: Self-pay | Admitting: Internal Medicine

## 2011-06-16 ENCOUNTER — Encounter: Payer: Self-pay | Admitting: Cardiology

## 2011-06-16 ENCOUNTER — Ambulatory Visit (INDEPENDENT_AMBULATORY_CARE_PROVIDER_SITE_OTHER): Payer: Medicare Other | Admitting: Cardiology

## 2011-06-16 DIAGNOSIS — Z79899 Other long term (current) drug therapy: Secondary | ICD-10-CM

## 2011-06-16 DIAGNOSIS — I5032 Chronic diastolic (congestive) heart failure: Secondary | ICD-10-CM

## 2011-06-16 DIAGNOSIS — I10 Essential (primary) hypertension: Secondary | ICD-10-CM

## 2011-06-16 DIAGNOSIS — R0602 Shortness of breath: Secondary | ICD-10-CM

## 2011-06-16 DIAGNOSIS — E662 Morbid (severe) obesity with alveolar hypoventilation: Secondary | ICD-10-CM

## 2011-06-16 DIAGNOSIS — N289 Disorder of kidney and ureter, unspecified: Secondary | ICD-10-CM

## 2011-06-16 DIAGNOSIS — I25810 Atherosclerosis of coronary artery bypass graft(s) without angina pectoris: Secondary | ICD-10-CM

## 2011-06-16 DIAGNOSIS — E785 Hyperlipidemia, unspecified: Secondary | ICD-10-CM

## 2011-06-16 NOTE — Patient Instructions (Signed)
Follow up in 4 months with Dr. Aundra Dubin. Have a BMET & BNP in 4 months prior to follow up.

## 2011-06-18 NOTE — Assessment & Plan Note (Signed)
Volume looks ok today.  Stamina is better after pulmonary rehab.  Continue current dose of Lasix.  BMET/BNP today.

## 2011-06-18 NOTE — Assessment & Plan Note (Signed)
Recent normal myoview.  Continue ASA, Crestor, Coreg, ARB.

## 2011-06-18 NOTE — Assessment & Plan Note (Signed)
Creatinine has been stable.  Getting BMET today.

## 2011-06-18 NOTE — Assessment & Plan Note (Signed)
OHS/OSA.  On oxygen with exertion and CPAP at night.

## 2011-06-18 NOTE — Assessment & Plan Note (Signed)
BP is under good control.  

## 2011-06-18 NOTE — Assessment & Plan Note (Signed)
LDL at goal <70

## 2011-06-18 NOTE — Progress Notes (Signed)
PCP: Dr. Hoy Morn   This is a 75 year old with history of coronary artery disease status post CABG, resistant hypertension, renal artery stenosis, morbid obesity, diastolic congestive heart failure, CKD, and OHS/OSA who presents to Cardiology Clinic for followup. In 2/12, she was hospitalized at [facility] with overdiuresis and acute renal failure after being put on daily metolozone.  She is using home oxygen with exertion for OHS and CPAP at night for OSA.    She has finished pulmonary rehab and feels like her endurance has improved.  She is now doing the [employer] at [facility].  She can walk about 50 yards before getting short of breath.  This is an improvement. No chest pain.  Weight is stable.  Blood pressure remains under good control.    Labs (8/11): K 4.2, creatinine 1.25, HCT 40  Labs (11/11): K 4.5, creatinine 1.36  Labs (1/12): LDL 73, HDL 41, LFTs normal  Labs (3/12): TSH normal, HCT 36, K 3.9, creatinine 1.3, HDL 43, LDL 45  Labs (5/12): creatinine 1.7 => 1.4, K 4.1, BNP 130 Labs (6/12): creatinine 1.66, K 4.3 Labs (9/12): HDL 40, LDL 65 Labs (10/12): creatinine 1.7  Allergies (verified):  1) ! Norvasc  2) ! Codeine  3) ! Clonidine Hcl  4) ! Ace Inhibitors  5) * Metformin   Family History:  Father - CAD/MI, AAA age 35  Mother - DM, Renal failure age 94  Brother - DM, Renal failure age 22  Sister - DM   Social History:  Married, 3 daughters. No history of smoking or alcohol use. Works part time with [employer].   Past Medical History:  1. Hyperlipidemia.  2. Resistant hypertension times many years. The patient does have renal artery stenosis. She has tried calcium channel blockers in the past and states that she would not take them now because she had some problems with her gums which her dentist identified as calcium-channel blocker side effects.  3. Type 2 diabetes.  4. Coronary artery disease, status post anterior MI in 1996 followed by coronary artery bypass grafting.  Lexiscan myoview (4/11): EF 67%, normal perfusion with no evidence for ischemia or infarction.  5. Renal artery stenosis. The patient has an occluded right renal artery and an atrophic right kidney. There is 20% left renal artery stenosis. This was seen by catheterization in 2007.  6. History of diastolic congestive heart failure. Most recent echo (12/10) showed EF 55-60% with mild LVH, grade I diastolic dysfunction, mild LAE.  7. Aortic valve disorder: Echo in 2009 showed mean aortic valve gradient of 13 mmHg, suggesting very mild stenosis. Echo (12/10) suggested aortic sclerosis only.  8. Obesity.  9.Chronic kidney disease. Most recent creatinine was 1.3. Patient had acute on chronic renal failure in 2/12, likely due to use of daily metolozone.  10. Obstructive sleep apnea  - PSG 01/05/06 AHI 24.8  - CPAP 9 cm H2O  11. Mild asthma  - PFT 02/05/10 FEV1 1.38(&&%), FEV1% 73, TLC 3.78(86%), DLCO 48%, +BD  12. ACE inhibitor cough  13. Hypothyroidism  14. Hypoxemia likely from Obesity-Hypoventilation syndrome  - SpO2 83% with exertion  - 2 liters oxygen 24/7  15. Pulmonary nodule Rt upper lobe, borderline mediastinal adenopathy  - CT chest June 2011  16. V/Q scan (5/11): low probability of PE.  17. Carotid dopplers (4/11): Mild disease only.    Current Outpatient Prescriptions  Medication Sig Dispense Refill  . 5-Hydroxytryptophan (5-HTP PO) Take by mouth. As directed        .  aspirin 81 MG EC tablet Take 81 mg by mouth daily.        . beclomethasone (QVAR) 80 MCG/ACT inhaler One puff in morning and two puffs at night for two weeks.  If okay, then use two puffs at night.  1 Inhaler    . carvedilol (COREG) 25 MG tablet Take 25 mg by mouth 2 (two) times daily with a meal.        . Cholecalciferol (D 5000 PO) Take by mouth as directed.        . co-enzyme Q-10 30 MG capsule 2 capsules once daily      . ferrous fumarate-iron polysaccharide complex (TANDEM) 162-115.2 MG CAPS Take 1 capsule by  mouth daily with breakfast.        . FLUoxetine (PROZAC) 40 MG capsule Take 40 mg by mouth daily.        . furosemide (LASIX) 40 MG tablet Take 1 tablet (40 mg total) by mouth as directed. Alternate 40mg  once daily with 40mg  AM and 20mg  PM.  45 tablet  6  . hydrALAZINE (APRESOLINE) 25 MG tablet Take 25 mg by mouth 3 (three) times daily.        . insulin glargine (LANTUS) 100 UNIT/ML injection Inject 40 Units into the skin at bedtime.       [name] levothyroxine (SYNTHROID, LEVOTHROID) 50 MCG tablet Take 50 mcg by mouth daily.        [name] liothyronine (CYTOMEL) 5 MCG tablet Take 5 mcg by mouth 2 (two) times daily.       . polyethylene glycol (MIRALAX / GLYCOLAX) packet Take 17 g by mouth daily as needed.        . rosuvastatin (CRESTOR) 40 MG tablet Take 40 mg by mouth daily.        [name] telmisartan (MICARDIS) 80 MG tablet Take 80 mg by mouth daily.        . traZODone (DESYREL) 50 MG tablet Take 50 mg by mouth daily as needed.        . triamcinolone (KENALOG) 0.5 % cream as needed.        BP 126/81  Pulse 74  Ht 5\' 3"  (1.6 m)  Wt 249 lb (112.946 kg)  BMI 44.11 kg/m2 General: Well developed, well nourished, in no acute distress. Obese.  Neck: Neck supple, JVP 7 cm. No masses, thyromegaly or abnormal cervical nodes.  Lungs: Clear bilaterally to auscultation and percussion.  Heart: Non-displaced PMI, chest non-tender; regular rate and rhythm, S1, S2 without rubs or gallops. 1/6 SEM RUSB.  Carotid upstroke normal, no bruit.  No edema. Abdomen: Bowel sounds positive; abdomen soft and non-tender without masses, organomegaly, or hernias noted. No hepatosplenomegaly.  Extremities: No clubbing or cyanosis.  Neurologic: Alert and oriented x 3.  Psych: Normal affect.

## 2011-07-19 ENCOUNTER — Ambulatory Visit (INDEPENDENT_AMBULATORY_CARE_PROVIDER_SITE_OTHER)
Admission: RE | Admit: 2011-07-19 | Discharge: 2011-07-19 | Disposition: A | Discharge status: Home / Self Care | Payer: Medicare Other | Source: Ambulatory Visit | Attending: Pulmonary Disease | Admitting: Pulmonary Disease

## 2011-07-19 DIAGNOSIS — J984 Other disorders of lung: Secondary | ICD-10-CM

## 2011-08-02 ENCOUNTER — Encounter: Payer: Self-pay | Admitting: Cardiology

## 2011-08-04 ENCOUNTER — Ambulatory Visit (INDEPENDENT_AMBULATORY_CARE_PROVIDER_SITE_OTHER): Payer: Medicare Other | Admitting: Pulmonary Disease

## 2011-08-04 ENCOUNTER — Encounter: Payer: Self-pay | Admitting: Pulmonary Disease

## 2011-08-04 DIAGNOSIS — R0602 Shortness of breath: Secondary | ICD-10-CM

## 2011-08-04 DIAGNOSIS — E662 Morbid (severe) obesity with alveolar hypoventilation: Secondary | ICD-10-CM

## 2011-08-04 DIAGNOSIS — J984 Other disorders of lung: Secondary | ICD-10-CM

## 2011-08-04 DIAGNOSIS — J45909 Unspecified asthma, uncomplicated: Secondary | ICD-10-CM

## 2011-08-04 DIAGNOSIS — G4733 Obstructive sleep apnea (adult) (pediatric): Secondary | ICD-10-CM

## 2011-08-04 NOTE — Assessment & Plan Note (Signed)
She has been doing well.  Continue Qvar.

## 2011-08-04 NOTE — Assessment & Plan Note (Signed)
She is to continue with 24/7 oxygen and CPAP at night.

## 2011-08-04 NOTE — Assessment & Plan Note (Signed)
Will arrange for auto CPAP to see if she can have her pressure adjusted.  She would like to lower her pressure.

## 2011-08-04 NOTE — Assessment & Plan Note (Signed)
Stable over 2 years through 2012.  Likely benign lesions.  No radiographic follow up needed.

## 2011-08-04 NOTE — Assessment & Plan Note (Signed)
Related to OHS/OSA, diastolic CHF, asthma, obesity/deconditioning.

## 2011-08-04 NOTE — Patient Instructions (Signed)
Will arrange for check of CPAP pressure and call with results Follow up in 6 months

## 2011-08-04 NOTE — Progress Notes (Signed)
Chief Complaint  Patient presents with  . Follow-up    Follow up for CT results   CC: Dawn Bradford, Dawn Bradford   History of Present Illness: Dawn Bradford is a 75 y.o. female multifactorial dyspnea, asthma, OSA/OHS, deconditioning, diastolic dysfunction, and pulmonary nodule.  She is here to review her CT chest.  She feels her breathing is doing okay.  She denies cough, wheeze, sputum, fever, chest pain, or hemoptysis.  She is using her Qvar w/o difficulty.  She feels her CPAP helps.  She gets air leaking from her mask occasionally.  She would like to see if her pressure setting can be decreased.  She continues to use oxygen 24/7.  Past Medical History  Diagnosis Date  . Hyperlipidemia   . Hypertension     resistant hyptertension times many years. the patient does have renal artery stenosis. she has tried calcium channel blockers in the past and states that she would not take them now because she had some problems with her gums which her dentist identified as calcium-channel blocker side effects.  . Diabetes mellitus     type 2  . Coronary artery disease     s/p anterior MI in 1996 followed by CABG. Lexiscan myoview (4/11): EF 67%, normal perfusion with no evidence for ischemia or infarction.   . Renal artery stenosis     The patient has an occluded right renal artery and an atrophic right kidney. There is 20% left renal artery stenosis. This was seen by catheterization in 2007  . Diastolic heart failure     most recent echo (12/10) showed EF 55-60% with mild LVH, grade I diastolic dysfunction, mild LAE.  [name] Aortic valve disorder     Echo in 2009 showed mean aortic valve gradient of 13 mmHg, suggesting very mild stenosis. Echo (12/10) suggested aortic sclerosis only  . Obesity   . Chronic kidney disease     most recent creatinine was 1.2  . Obstructive sleep apnea     PSG 01/05/2006 AHI 24.8, CPAP 9cm H2O  . Mild asthma     PFT 02/05/10 FEV1 1.38, FEV1% 73, TLC 3.78 (86%), DLCO  48%, +BD  . ACE-inhibitor cough   . Hypothyroidism   . Obesity hypoventilation syndrome        . Pulmonary nodule, right   . Carotid artery disease     mild, carotid dopplers 12/2009    Past Surgical History  Procedure Date  . Tubal ligation 1973  . Inner ear surgery 1979    right  . Abdominal hysterectomy 1985  . Coronary artery bypass graft 1996  . Cataract extraction 2010    Current Outpatient Prescriptions on File Prior to Visit  Medication Sig Dispense Refill  . 5-Hydroxytryptophan (5-HTP PO) Take by mouth. As directed        . aspirin 81 MG EC tablet Take 81 mg by mouth daily.        . beclomethasone (QVAR) 80 MCG/ACT inhaler One puff in morning and two puffs at night for two weeks.  If okay, then use two puffs at night.  1 Inhaler    . carvedilol (COREG) 25 MG tablet Take 25 mg by mouth 2 (two) times daily with a meal.        . Cholecalciferol (D 5000 PO) Take by mouth as directed.        . co-enzyme Q-10 30 MG capsule 2 capsules once daily      . ferrous fumarate-iron polysaccharide complex (TANDEM)  162-115.2 MG CAPS Take 1 capsule by mouth daily with breakfast.        . FLUoxetine (PROZAC) 40 MG capsule Take 40 mg by mouth daily.        . furosemide (LASIX) 40 MG tablet Take 1 tablet (40 mg total) by mouth as directed. Alternate 40mg  once daily with 40mg  AM and 20mg  PM.  45 tablet  6  . hydrALAZINE (APRESOLINE) 25 MG tablet Take 25 mg by mouth 3 (three) times daily.        . insulin glargine (LANTUS) 100 UNIT/ML injection Inject 40 Units into the skin at bedtime.       [name] levothyroxine (SYNTHROID, LEVOTHROID) 50 MCG tablet Take 50 mcg by mouth daily.        [name] liothyronine (CYTOMEL) 5 MCG tablet Take 5 mcg by mouth 2 (two) times daily.       . polyethylene glycol (MIRALAX / GLYCOLAX) packet Take 17 g by mouth daily as needed.        . rosuvastatin (CRESTOR) 40 MG tablet Take 40 mg by mouth daily.        [name] telmisartan (MICARDIS) 80 MG tablet Take 80 mg by mouth daily.         . traZODone (DESYREL) 50 MG tablet Take 50 mg by mouth daily as needed.        . triamcinolone (KENALOG) 0.5 % cream as needed.        Allergies  Allergen Reactions  . Ace Inhibitors     unknown  . Amlodipine Besylate     "something happened to gums"  . Clonidine Hydrochloride   . Codeine Hives  . Metformin     REACTION: vomiting    Physical Exam:  Blood pressure 130/70, pulse 69, temperature 98.7 F (37.1 C), temperature source Oral, height 5\' 2"  (1.575 m), weight 247 lb 6.4 oz (112.22 kg), SpO2 95.00%.  General - Obese, using oxygen HEENT - no sinus tenderness, no oral exudate, no LAN Cardiac - s1s2 regular, 2/6 SM Chest - decreased breath sounds, no wheeze/rales Abdomen - soft, nontender Extremities - no edema Skin - no rashes Neurologic - normal strength, CN intact Psychiatric - normal mood, behavior  Ct Chest Wo Contrast  07/19/2011  *RADIOLOGY REPORT*  Clinical Data: Follow-up of pulmonary nodule. On CPAP at night. A nodule was originally detected in June 2011 chest CT.  CT CHEST WITHOUT CONTRAST  Technique:  Multidetector CT imaging of the chest was performed following the standard protocol without IV contrast.  Comparison: Chest CT 08/03/2010, and chest CT 02/08/2010.  Findings: Obesity.  Changes of median sternotomy for CABG.  Stable cardiomegaly.  Atherosclerotic calcification of the normal caliber but tortuous thoracic aorta.  Aortic valvular calcifications are present.  This can be associated with aortic valve stenosis.  Mildly prominent mediastinal lymph nodes are stable.  Precarinal lymph node measures 12 mm.  Partially calcified subcarinal lymph node measures 15 mm short axis.  No definite hilar lymphadenopathy; evaluation is limited without IV contrast.  Negative for pleural or pericardial effusion.  Lung volumes are low, and there are patchy ground-glass opacities with intervening areas of more normal aeration, and/or air- trapping.  5 mm nodule in the central  right upper lobe, adjacent to the hilar vessels is stable (image #15 of the lung windows).  No new lung nodule is detected.  Advanced degenerative changes of the thoracic spine.  No fracture or suspicious lesion.   IMPRESSION:  1.  Stable 5 mm right  upper lobe pulmonary nodule.  The noncontrast chest CT could be performed in June 2013 to complete a 2-year follow-up. 2.  Mosaic attenuation of the lungs with patchy areas of decreased aeration.  Findings are favored to be due to small airways disease, which can be seen in the setting of obesity or smoking.  Pulmonary edema is felt to be less likely given the lack of intralobular septal thickening or pleural effusion. 3.  Cardiomegaly and prior CABG. 4.  Aortic valve calcifications.  This can be associated with aortic valve stenosis. 5.  Stable mildly prominent mediastinal lymph nodes.  Original Report Authenticated By: Curlene Dolphin, M.D.   Assessment/Plan:  PULMONARY NODULE Stable over 2 years through 2012.  Likely benign lesions.  No radiographic follow up needed.   ASTHMA She has been doing well.  Continue Qvar.      SLEEP APNEA, OBSTRUCTIVE Will arrange for auto CPAP to see if she can have her pressure adjusted.  She would like to lower her pressure.  Obesity hypoventilation syndrome She is to continue with 24/7 oxygen and CPAP at night.      Shortness of breath Related to OHS/OSA, diastolic CHF, asthma, obesity/deconditioning.         Outpatient Encounter Prescriptions as of 08/04/2011  Medication Sig Dispense Refill  . 5-Hydroxytryptophan (5-HTP PO) Take by mouth. As directed        . aspirin 81 MG EC tablet Take 81 mg by mouth daily.        . beclomethasone (QVAR) 80 MCG/ACT inhaler One puff in morning and two puffs at night for two weeks.  If okay, then use two puffs at night.  1 Inhaler    . carvedilol (COREG) 25 MG tablet Take 25 mg by mouth 2 (two) times daily with a meal.        . Cholecalciferol (D 5000 PO) Take by  mouth as directed.        . co-enzyme Q-10 30 MG capsule 2 capsules once daily      . ferrous fumarate-iron polysaccharide complex (TANDEM) 162-115.2 MG CAPS Take 1 capsule by mouth daily with breakfast.        . ferrous gluconate (FERGON) 325 MG tablet Take 325 mg by mouth daily with breakfast.        . FLUoxetine (PROZAC) 40 MG capsule Take 40 mg by mouth daily.        . furosemide (LASIX) 40 MG tablet Take 1 tablet (40 mg total) by mouth as directed. Alternate 40mg  once daily with 40mg  AM and 20mg  PM.  45 tablet  6  . hydrALAZINE (APRESOLINE) 25 MG tablet Take 25 mg by mouth 3 (three) times daily.        . insulin glargine (LANTUS) 100 UNIT/ML injection Inject 40 Units into the skin at bedtime.       [name] levothyroxine (SYNTHROID, LEVOTHROID) 50 MCG tablet Take 50 mcg by mouth daily.        [name] liothyronine (CYTOMEL) 5 MCG tablet Take 5 mcg by mouth 2 (two) times daily.       . polyethylene glycol (MIRALAX / GLYCOLAX) packet Take 17 g by mouth daily as needed.        . rosuvastatin (CRESTOR) 40 MG tablet Take 40 mg by mouth daily.        [name] telmisartan (MICARDIS) 80 MG tablet Take 80 mg by mouth daily.        . traZODone (DESYREL) 50 MG tablet Take 50 mg by  mouth daily as needed.        . triamcinolone (KENALOG) 0.5 % cream as needed.        Lynix Bonine Pager:  [phone] 08/04/2011, 2:55 PM

## 2011-08-07 ENCOUNTER — Other Ambulatory Visit: Payer: Self-pay | Admitting: Cardiology

## 2011-08-25 ENCOUNTER — Encounter: Payer: Self-pay | Admitting: Pulmonary Disease

## 2011-08-25 ENCOUNTER — Telehealth: Payer: Self-pay | Admitting: Pulmonary Disease

## 2011-08-25 DIAGNOSIS — G4733 Obstructive sleep apnea (adult) (pediatric): Secondary | ICD-10-CM

## 2011-08-25 NOTE — Telephone Encounter (Signed)
Auto CPAP 08/08/11 to 08/22/11>>Used on 15 of 15 nights with average 10 hrs 6 min.  Average AHI 3.8 with mean CPAP 8 cm H2O and 95th percentile CPAP 11 cm H2O.  Will have my nurse inform pt that CPAP report looked good, and will decrease pressure from 9 cm H2O to 8 cm H2O.  She is to call if she has trouble with pressure change.

## 2011-08-26 ENCOUNTER — Ambulatory Visit (INDEPENDENT_AMBULATORY_CARE_PROVIDER_SITE_OTHER): Payer: Medicare Other | Admitting: Adult Health

## 2011-08-26 DIAGNOSIS — G4733 Obstructive sleep apnea (adult) (pediatric): Secondary | ICD-10-CM

## 2011-08-26 NOTE — Assessment & Plan Note (Signed)
Scheduling error not seen

## 2011-08-26 NOTE — Telephone Encounter (Signed)
Pt was scheduled for appt with TP 12.21.12 > unable to tell per pt's chart why appt was needed.  Last ov with VS 11.29.12.  Per pt, no sick symptoms.  Pt aware of CPAP pressure change and stated that her DME has already come to her home to adjust the pressure.  Pt is aware of download results as stated by VS per this phone note and verbalized her understanding.  Will sign off.

## 2011-08-26 NOTE — Progress Notes (Signed)
appt notes states 2-3 week follow up.  Last ov note 11.29.12 state to follow up in 6 months.  No result notes stating pt needs sooner follow up.  Pt reported DME has already come to the home to adjust her CPAP settings.  Pt denied any sick symptoms.  appt voided.  No charge this visit.

## 2011-08-26 NOTE — Progress Notes (Signed)
  Subjective:    Patient ID: Dawn Bradford, female    DOB: April 28, 1936, 75 y.o.   MRN: [id number]  HPI Scheduling error , pt not seen    Review of Systems         Objective:   Physical Exam           Assessment & Plan:

## 2011-10-19 ENCOUNTER — Encounter: Payer: Self-pay | Admitting: Cardiology

## 2011-10-19 ENCOUNTER — Ambulatory Visit (INDEPENDENT_AMBULATORY_CARE_PROVIDER_SITE_OTHER): Payer: Medicare Other | Admitting: Cardiology

## 2011-10-19 DIAGNOSIS — E662 Morbid (severe) obesity with alveolar hypoventilation: Secondary | ICD-10-CM

## 2011-10-19 DIAGNOSIS — R0602 Shortness of breath: Secondary | ICD-10-CM

## 2011-10-19 DIAGNOSIS — N289 Disorder of kidney and ureter, unspecified: Secondary | ICD-10-CM

## 2011-10-19 DIAGNOSIS — E785 Hyperlipidemia, unspecified: Secondary | ICD-10-CM

## 2011-10-19 DIAGNOSIS — I509 Heart failure, unspecified: Secondary | ICD-10-CM

## 2011-10-19 DIAGNOSIS — I25810 Atherosclerosis of coronary artery bypass graft(s) without angina pectoris: Secondary | ICD-10-CM

## 2011-10-19 DIAGNOSIS — I10 Essential (primary) hypertension: Secondary | ICD-10-CM

## 2011-10-19 DIAGNOSIS — I5032 Chronic diastolic (congestive) heart failure: Secondary | ICD-10-CM

## 2011-10-19 NOTE — Patient Instructions (Signed)
Your physician wants you to follow-up in: Follow up with Dr. Rockey Situ in 4 months. You will receive a reminder letter in the mail two months in advance. If you don't receive a letter, please call our office to schedule the follow-up appointment.  Liver/Lipid/BMET/BNP

## 2011-10-21 ENCOUNTER — Ambulatory Visit (INDEPENDENT_AMBULATORY_CARE_PROVIDER_SITE_OTHER): Payer: Medicare Other | Admitting: *Deleted

## 2011-10-21 DIAGNOSIS — Z79899 Other long term (current) drug therapy: Secondary | ICD-10-CM

## 2011-10-21 DIAGNOSIS — R0602 Shortness of breath: Secondary | ICD-10-CM

## 2011-10-21 DIAGNOSIS — I25810 Atherosclerosis of coronary artery bypass graft(s) without angina pectoris: Secondary | ICD-10-CM

## 2011-10-22 LAB — LIPID PANEL
Chol/HDL Ratio: 2.9 ratio (ref 0.0–4.4)
Cholesterol, Total: 123 mg/dL (ref 100–199)
HDL: 43 mg/dL
LDL Calculated: 61 mg/dL (ref 0–99)
Triglycerides: 94 mg/dL (ref 0–149)
VLDL Cholesterol Cal: 19 mg/dL (ref 5–40)

## 2011-10-22 LAB — HEPATIC FUNCTION PANEL
ALT: 13 IU/L (ref 0–32)
AST: 13 IU/L (ref 0–40)
Albumin: 3.6 g/dL (ref 3.5–4.8)
Alkaline Phosphatase: 81 IU/L (ref 25–165)
Bilirubin, Direct: 0.12 mg/dL (ref 0.00–0.40)
Total Bilirubin: 0.3 mg/dL (ref 0.0–1.2)
Total Protein: 6.3 g/dL (ref 6.0–8.5)

## 2011-10-22 LAB — BASIC METABOLIC PANEL WITH GFR
BUN/Creatinine Ratio: 16 (ref 11–26)
BUN: 24 mg/dL (ref 8–27)
CO2: 24 mmol/L (ref 20–32)
Calcium: 8.8 mg/dL (ref 8.6–10.2)
Chloride: 98 mmol/L (ref 97–108)
Creatinine, Ser: 1.52 mg/dL — ABNORMAL HIGH (ref 0.57–1.00)
GFR calc Af Amer: 39 mL/min/1.73 — ABNORMAL LOW
GFR calc non Af Amer: 34 mL/min/1.73 — ABNORMAL LOW
Glucose: 201 mg/dL — ABNORMAL HIGH (ref 65–99)
Potassium: 4.1 mmol/L (ref 3.5–5.2)
Sodium: 137 mmol/L (ref 134–144)

## 2011-10-23 LAB — BRAIN NATRIURETIC PEPTIDE: BNP: 71 pg/mL (ref 0.0–100.0)

## 2011-10-23 NOTE — Assessment & Plan Note (Signed)
Volume looks ok today.  Stamina is better after pulmonary rehab.  Continue current dose of Lasix.  BMET/BNP today.

## 2011-10-23 NOTE — Assessment & Plan Note (Signed)
Normal myoview in 4/11.  Continue ASA, Crestor, Coreg, ARB.

## 2011-10-23 NOTE — Assessment & Plan Note (Signed)
Check lipids today, goal LDL < 70.  

## 2011-10-23 NOTE — Progress Notes (Signed)
PCP: Dr. Hoy Morn   This is a 76 year old with history of coronary artery disease status post CABG, resistant hypertension, renal artery stenosis, morbid obesity, diastolic congestive heart failure, CKD, and OHS/OSA who presents to [facility]iology [facility] for followup. In 2/12, she was hospitalized at [facility] with overdiuresis and acute renal failure after being put on daily metolozone.  She is using home oxygen with exertion for OHS and CPAP at night for OSA.    Dawn Bradford is symptomatically stable.  She can walk about 100 yards before getting short of breath and is short of breath walking up steps.  This is an improvement. No chest pain.  Weight is down 2 lbs.  Blood pressure remains under good control.    ECG: NSR, normal  Labs (8/11): K 4.2, creatinine 1.25, HCT 40  Labs (11/11): K 4.5, creatinine 1.36  Labs (1/12): LDL 73, HDL 41, LFTs normal  Labs (3/12): TSH normal, HCT 36, K 3.9, creatinine 1.3, HDL 43, LDL 45  Labs (5/12): creatinine 1.7 => 1.4, K 4.1, BNP 130 Labs (6/12): creatinine 1.66, K 4.3 Labs (9/12): HDL 40, LDL 65 Labs (10/12): creatinine 1.7  Allergies (verified):  1) ! Norvasc  2) ! Codeine  3) ! Clonidine Hcl  4) ! Ace Inhibitors  5) * Metformin   Family History:  Father - CAD/MI, AAA age 15  Mother - DM, Renal failure age 10  Brother - DM, Renal failure age 47  Sister - DM   Social History:  Married, 3 daughters. No history of smoking or alcohol use. Works part time with [employer].   Past Medical History:  1. Hyperlipidemia.  2. Resistant hypertension times many years. The patient does have renal artery stenosis. She has tried calcium channel blockers in the past and states that she would not take them now because she had some problems with her gums which her dentist identified as calcium-channel blocker side effects.  3. Type 2 diabetes.  4. Coronary artery disease, status post anterior MI in 1996 followed by coronary artery bypass grafting. Lexiscan myoview (4/11): EF  67%, normal perfusion with no evidence for ischemia or infarction.  5. Renal artery stenosis. The patient has an occluded right renal artery and an atrophic right kidney. There is 20% left renal artery stenosis. This was seen by catheterization in 2007.  6. History of diastolic congestive heart failure. Most recent echo (12/10) showed EF 55-60% with mild LVH, grade I diastolic dysfunction, mild LAE.  7. Aortic valve disorder: Echo in 2009 showed mean aortic valve gradient of 13 mmHg, suggesting very mild stenosis. Echo (12/10) suggested aortic sclerosis only.  8. Obesity.  9.Chronic kidney disease. Most recent creatinine was 1.7. Patient had acute on chronic renal failure in 2/12, likely due to use of daily metolozone.  10. Obstructive sleep apnea  - PSG 01/05/06 AHI 24.8  - CPAP 9 cm H2O  11. Mild asthma  - PFT 02/05/10 FEV1 1.38(&&%), FEV1% 73, TLC 3.78(86%), DLCO 48%, +BD  12. ACE inhibitor cough  13. Hypothyroidism  14. Hypoxemia likely from Obesity-Hypoventilation syndrome  - SpO2 83% with exertion  - 2 liters oxygen 24/7  15. Pulmonary nodule Rt upper lobe, borderline mediastinal adenopathy  - CT chest June 2011  16. V/Q scan (5/11): low probability of PE.  17. Carotid dopplers (4/11): Mild disease only.    Current Outpatient Prescriptions  Medication Sig Dispense Refill  . 5-Hydroxytryptophan (5-HTP PO) Take by mouth. As directed        .  aspirin 81 MG EC tablet Take 81 mg by mouth daily.        . beclomethasone (QVAR) 80 MCG/ACT inhaler One puff in morning and two puffs at night for two weeks.  If okay, then use two puffs at night.  1 Inhaler    . carvedilol (COREG) 25 MG tablet Take 25 mg by mouth 2 (two) times daily with a meal.        . Cholecalciferol (D 5000 PO) Take by mouth as directed.        . co-enzyme Q-10 30 MG capsule 2 capsules once daily      . ferrous fumarate-iron polysaccharide complex (TANDEM) 162-115.2 MG CAPS Take 1 capsule by mouth daily with breakfast.         . ferrous gluconate (FERGON) 325 MG tablet Take 325 mg by mouth daily with breakfast.        . FLUoxetine (PROZAC) 40 MG capsule Take 40 mg by mouth daily.        . furosemide (LASIX) 40 MG tablet Take 1 tablet (40 mg total) by mouth as directed. Alternate 40mg  once daily with 40mg  AM and 20mg  PM.  45 tablet  6  . hydrALAZINE (APRESOLINE) 25 MG tablet Take 25 mg by mouth 3 (three) times daily.        . insulin glargine (LANTUS) 100 UNIT/ML injection Inject 40 Units into the skin at bedtime.       [name] levothyroxine (SYNTHROID, LEVOTHROID) 50 MCG tablet Take 50 mcg by mouth daily.        [name] liothyronine (CYTOMEL) 5 MCG tablet Take 5 mcg by mouth 2 (two) times daily.       [name] MICARDIS 80 MG tablet TAKE ONE TABLET BY MOUTH EVERY DAY  30 each  5  . polyethylene glycol (MIRALAX / GLYCOLAX) packet Take 17 g by mouth daily as needed.        . rosuvastatin (CRESTOR) 40 MG tablet Take 40 mg by mouth daily.        . traZODone (DESYREL) 50 MG tablet Take 50 mg by mouth daily as needed.        . triamcinolone (KENALOG) 0.5 % cream as needed.        BP 134/82  Pulse 73  Ht 5\' 2"  (1.575 m)  Wt 112.038 kg (247 lb)  BMI 45.18 kg/m2 General: Well developed, well nourished, in no acute distress. Obese.  Neck: Neck thick, JVP 7 cm. No masses, thyromegaly or abnormal cervical nodes.  Lungs: Clear bilaterally to auscultation and percussion.  Heart: Non-displaced PMI, chest non-tender; regular rate and rhythm, S1, S2 without rubs or gallops. 1/6 SEM RUSB.  Carotid upstroke normal, no bruit.  1+ ankle edema bilaterally. Abdomen: Bowel sounds positive; abdomen soft and non-tender without masses, organomegaly, or hernias noted. No hepatosplenomegaly.  Extremities: No clubbing or cyanosis.  Neurologic: Alert and oriented x 3.  Psych: Normal affect.

## 2011-10-23 NOTE — Assessment & Plan Note (Signed)
Creatinine has been relatively stable.  Getting BMET today.

## 2011-10-23 NOTE — Assessment & Plan Note (Signed)
BP is under good control.  

## 2011-10-23 NOTE — Assessment & Plan Note (Signed)
OHS/OSA.  On oxygen with exertion and CPAP at night.

## 2011-11-11 ENCOUNTER — Ambulatory Visit: Payer: Self-pay | Admitting: Family Medicine

## 2012-01-18 ENCOUNTER — Encounter: Payer: Self-pay | Admitting: Pulmonary Disease

## 2012-01-18 ENCOUNTER — Ambulatory Visit (INDEPENDENT_AMBULATORY_CARE_PROVIDER_SITE_OTHER): Payer: Medicare Other | Admitting: Pulmonary Disease

## 2012-01-18 VITALS — BP 114/76 | HR 66 | Temp 98.6°F | Ht 62.0 in | Wt 250.0 lb

## 2012-01-18 DIAGNOSIS — J45909 Unspecified asthma, uncomplicated: Secondary | ICD-10-CM

## 2012-01-18 DIAGNOSIS — E662 Morbid (severe) obesity with alveolar hypoventilation: Secondary | ICD-10-CM

## 2012-01-18 DIAGNOSIS — G4733 Obstructive sleep apnea (adult) (pediatric): Secondary | ICD-10-CM

## 2012-01-18 DIAGNOSIS — R0602 Shortness of breath: Secondary | ICD-10-CM

## 2012-01-18 MED ORDER — ALBUTEROL SULFATE HFA 108 (90 BASE) MCG/ACT IN AERS: 2.0000 | INHALATION_SPRAY | Freq: Four times a day (QID) | RESPIRATORY_TRACT | Status: DC | PRN

## 2012-01-18 MED ORDER — AEROCHAMBER MV MISC: Status: AC

## 2012-01-18 NOTE — Patient Instructions (Signed)
Stop Qvar Ventolin two puffs as needed for cough, wheeze, or chest congestion Follow up in 6 months

## 2012-01-18 NOTE — Assessment & Plan Note (Signed)
Stable.  Related to OHS/OSA, diastolic CHF, asthma, obesity/deconditioning.

## 2012-01-18 NOTE — Assessment & Plan Note (Signed)
She has minimal symptoms from this.  Will stop Qvar.  Will have her use ventolin as needed.  Have provided her with spacer device.

## 2012-01-18 NOTE — Assessment & Plan Note (Signed)
She has done better since her CPAP setting was reduced to 8 cm H2O.  I have advised her to adjust her humidifier to see if this improves mouth dryness.  Alternative would be to change her mask to nasal mask, but she is reluctant to do this.  I explained that she needs to continue with PAP therapy for her OSA.

## 2012-01-18 NOTE — Assessment & Plan Note (Signed)
She is to continue with 24/7 oxygen and CPAP at night.

## 2012-01-18 NOTE — Progress Notes (Signed)
Chief Complaint  Patient presents with  . Follow-up    Pt states she ahas her good and bad days with her breathing. denies any wheezing, chest tx, cough  . Sleep Apnea    Pt states she wears her cpap machine everynight x 6-8 hrs a night and wears it occasionally when she naps. denies any problems with mask/mask. Per pt her dentists believes the air is drying your teeth and causing them to decay faaster   CC: Dawn Bradford   History of Present Illness: Dawn Bradford is a 76 y.o. female multifactorial dyspnea, asthma, OSA/OHS, deconditioning, and diastolic dysfunction.  Auto CPAP 08/08/11 to 08/22/11>>Used on 15 of 15 nights with average 10 hrs 6 min. Average AHI 3.8 with mean CPAP 8 cm H2O and 95th percentile CPAP 11 cm H2O.  She has been sleeping better since her CPAP pressure was dropped to 8 cm H2O.  She was told by her dentist that CPAP is drying her teeth.  She is not having much cough, wheeze, or chest congestion.  She was told Qvar is causing problems with her teeth also.  She is only using Qvar a few times per week.  She is trying to exercise more.  She still gets winded and tired easily.  She is using her oxygen 24/7.    Past Medical History  Diagnosis Date  . Hyperlipidemia   . Hypertension     resistant hyptertension times many years. the patient does have renal artery stenosis. she has tried calcium channel blockers in the past and states that she would not take them now because she had some problems with her gums which her dentist identified as calcium-channel blocker side effects.  . Diabetes mellitus     type 2  . Coronary artery disease     s/p anterior MI in 1996 followed by CABG. Lexiscan myoview (4/11): EF 67%, normal perfusion with no evidence for ischemia or infarction.   . Renal artery stenosis     The patient has an occluded right renal artery and an atrophic right kidney. There is 20% left renal artery stenosis. This was seen by catheterization in 2007  .  Diastolic heart failure     most recent echo (12/10) showed EF 55-60% with mild LVH, grade I diastolic dysfunction, mild LAE.  [name] Aortic valve disorder     Echo in 2009 showed mean aortic valve gradient of 13 mmHg, suggesting very mild stenosis. Echo (12/10) suggested aortic sclerosis only  . Obesity   . Chronic kidney disease     most recent creatinine was 1.2  . Obstructive sleep apnea     PSG 01/05/2006 AHI 24.8, CPAP 9cm H2O  . Mild asthma     PFT 02/05/10 FEV1 1.38, FEV1% 73, TLC 3.78 (86%), DLCO 48%, +BD  . ACE-inhibitor cough   . Hypothyroidism   . Obesity hypoventilation syndrome        . Pulmonary nodule, right   . Carotid artery disease     mild, carotid dopplers 12/2009    Past Surgical History  Procedure Date  . Tubal ligation 1973  . Inner ear surgery 1979    right  . Abdominal hysterectomy 1985  . Coronary artery bypass graft 1996  . Cataract extraction 2010   Allergies  Allergen Reactions  . Ace Inhibitors     unknown  . Amlodipine Besylate     "something happened to gums"  . Clonidine Hydrochloride   . Codeine Hives  . Metformin  REACTION: vomiting    Physical Exam:  Blood pressure 114/76, pulse 66, temperature 98.6 F (37 C), temperature source Oral, height 5\' 2"  (1.575 m), weight 250 lb (113.399 kg), SpO2 97.00%.  Body mass index is 45.73 kg/(m^2).  Wt Readings from Last 3 Encounters:  01/18/12 250 lb (113.399 kg)  10/19/11 247 lb (112.038 kg)  08/04/11 247 lb 6.4 oz (112.22 kg)    General - Obese, using oxygen HEENT - no sinus tenderness, no oral exudate, no LAN Cardiac - s1s2 regular, 2/6 SM Chest - decreased breath sounds, no wheeze/rales Abdomen - soft, nontender Extremities - no edema Skin - no rashes Neurologic - normal strength, CN intact Psychiatric - normal mood, behavior   Assessment/Plan:   Outpatient Encounter Prescriptions as of 01/18/2012  Medication Sig Dispense Refill  . 5-Hydroxytryptophan (5-HTP PO) Take by mouth.  As directed        . aspirin 81 MG EC tablet Take 81 mg by mouth daily.        . carvedilol (COREG) 25 MG tablet Take 25 mg by mouth 2 (two) times daily with a meal.        . Cholecalciferol (D 5000 PO) Take by mouth as directed.        . co-enzyme Q-10 30 MG capsule 2 capsules once daily      . ferrous fumarate-iron polysaccharide complex (TANDEM) 162-115.2 MG CAPS Take 1 capsule by mouth daily with breakfast.        . ferrous gluconate (FERGON) 325 MG tablet Take 325 mg by mouth daily with breakfast.        . FLUoxetine (PROZAC) 40 MG capsule Take as directed      . furosemide (LASIX) 40 MG tablet Take 1 tablet (40 mg total) by mouth as directed. Alternate 40mg  once daily with 40mg  AM and 20mg  PM.  45 tablet  6  . hydrALAZINE (APRESOLINE) 25 MG tablet Take 25 mg by mouth 3 (three) times daily.        . insulin glargine (LANTUS) 100 UNIT/ML injection Inject 40 Units into the skin at bedtime.       [name] levothyroxine (SYNTHROID, LEVOTHROID) 50 MCG tablet Take 50 mcg by mouth daily.        [name] liothyronine (CYTOMEL) 5 MCG tablet Take 5 mcg by mouth 2 (two) times daily.       [name] MICARDIS 80 MG tablet TAKE ONE TABLET BY MOUTH EVERY DAY  30 each  5  . polyethylene glycol (MIRALAX / GLYCOLAX) packet Take 17 g by mouth daily as needed.        . rosuvastatin (CRESTOR) 40 MG tablet Take 40 mg by mouth daily.        . traZODone (DESYREL) 50 MG tablet Take 50 mg by mouth daily as needed.        . triamcinolone (KENALOG) 0.5 % cream as needed.      [name] DISCONTD: beclomethasone (QVAR) 80 MCG/ACT inhaler One puff in morning and two puffs at night for two weeks.  If okay, then use two puffs at night.  1 Inhaler    . DISCONTD: beclomethasone (QVAR) 80 MCG/ACT inhaler One puff in morning and two puffs at night for two weeks.  If okay, then use two puffs at night prn      . albuterol (VENTOLIN HFA) 108 (90 BASE) MCG/ACT inhaler Inhale 2 puffs into the lungs every 6 (six) hours as needed for wheezing.  1 Inhaler  5  .  Spacer/Aero-Holding  Chambers (AEROCHAMBER MV) inhaler Use as instructed  1 each  0    Tassie Pollett Pager:  [phone] 01/18/2012, 12:43 PM

## 2012-03-01 ENCOUNTER — Ambulatory Visit (INDEPENDENT_AMBULATORY_CARE_PROVIDER_SITE_OTHER): Payer: Medicare Other | Admitting: Cardiovascular Disease

## 2012-03-01 ENCOUNTER — Encounter: Payer: Self-pay | Admitting: Cardiovascular Disease

## 2012-03-01 VITALS — BP 132/84 | HR 76 | Ht 62.0 in | Wt 254.0 lb

## 2012-03-01 DIAGNOSIS — E785 Hyperlipidemia, unspecified: Secondary | ICD-10-CM

## 2012-03-01 DIAGNOSIS — I10 Essential (primary) hypertension: Secondary | ICD-10-CM

## 2012-03-01 DIAGNOSIS — I25810 Atherosclerosis of coronary artery bypass graft(s) without angina pectoris: Secondary | ICD-10-CM

## 2012-03-01 DIAGNOSIS — I5032 Chronic diastolic (congestive) heart failure: Secondary | ICD-10-CM

## 2012-03-01 DIAGNOSIS — N289 Disorder of kidney and ureter, unspecified: Secondary | ICD-10-CM

## 2012-03-01 MED ORDER — CARVEDILOL 25 MG PO TABS: 25.0000 mg | ORAL_TABLET | Freq: Two times a day (BID) | ORAL | Status: DC

## 2012-03-01 NOTE — Patient Instructions (Addendum)
You are doing well.  Continue same medications.  Refills on Coreg were sent to pharmacy.  Follow up in 6 months.

## 2012-03-01 NOTE — Progress Notes (Signed)
HPI  This is a 76 year old with history of coronary artery disease status post CABG, resistant hypertension, renal artery stenosis, morbid obesity, diastolic congestive heart failure, CKD, and OHS/OSA who presents to Cardiology Clinic for followup. She used to see Dr. Aundra Dubin in the past.  In 2/12, she was hospitalized at [facility] with overdiuresis and acute renal failure after being put on daily metolozone. She is using home oxygen with exertion for OHS and CPAP at night for OSA.  Dawn Bradford is symptomatically stable. She can walk about 100 yards before getting short of breath and is short of breath walking up steps. This is an improvement. No chest pain.  Blood pressure remains under good control.  She is attending pulmonary rehabilitation and seems to be improving. Her renal function is stable.   Allergies  Allergen Reactions  . Ace Inhibitors     unknown  . Amlodipine Besylate     "something happened to gums"  . Clonidine Hydrochloride   . Codeine Hives  . Metformin     REACTION: vomiting     Current Outpatient Prescriptions on File Prior to Visit  Medication Sig Dispense Refill  . 5-Hydroxytryptophan (5-HTP PO) Take by mouth. As directed        . albuterol (VENTOLIN HFA) 108 (90 BASE) MCG/ACT inhaler Inhale 2 puffs into the lungs every 6 (six) hours as needed for wheezing.  1 Inhaler  5  . aspirin 81 MG EC tablet Take 81 mg by mouth daily.        . Cholecalciferol (D 5000 PO) Take by mouth as directed.        . co-enzyme Q-10 30 MG capsule 2 capsules once daily      . ferrous fumarate-iron polysaccharide complex (TANDEM) 162-115.2 MG CAPS Take 1 capsule by mouth daily with breakfast.        . ferrous gluconate (FERGON) 325 MG tablet Take 325 mg by mouth daily with breakfast.        . FLUoxetine (PROZAC) 40 MG capsule Take as directed      . furosemide (LASIX) 40 MG tablet Take 1 tablet (40 mg total) by mouth as directed. Alternate 40mg  once daily with 40mg  AM and 20mg  PM.  45 tablet   6  . hydrALAZINE (APRESOLINE) 25 MG tablet Take 25 mg by mouth 3 (three) times daily.        . insulin glargine (LANTUS) 100 UNIT/ML injection Inject 40 Units into the skin at bedtime.       [name] levothyroxine (SYNTHROID, LEVOTHROID) 50 MCG tablet Take 50 mcg by mouth daily.        [name] liothyronine (CYTOMEL) 5 MCG tablet Take 5 mcg by mouth 2 (two) times daily.       [name] MICARDIS 80 MG tablet TAKE ONE TABLET BY MOUTH EVERY DAY  30 each  5  . polyethylene glycol (MIRALAX / GLYCOLAX) packet Take 17 g by mouth daily as needed.        [name] Spacer/Aero-Holding Chambers (AEROCHAMBER MV) inhaler Use as instructed  1 each  0  . traZODone (DESYREL) 50 MG tablet Take 50 mg by mouth daily as needed.        . triamcinolone (KENALOG) 0.5 % cream as needed.      [name] DISCONTD: carvedilol (COREG) 25 MG tablet Take 25 mg by mouth 2 (two) times daily with a meal.        . rosuvastatin (CRESTOR) 40 MG tablet Take 40 mg by mouth daily.  Past Medical History  Diagnosis Date  . Hyperlipidemia   . Hypertension     resistant hyptertension times many years. the patient does have renal artery stenosis. she has tried calcium channel blockers in the past and states that she would not take them now because she had some problems with her gums which her dentist identified as calcium-channel blocker side effects.  . Diabetes mellitus     type 2  . Coronary artery disease     s/p anterior MI in 1996 followed by CABG. Lexiscan myoview (4/11): EF 67%, normal perfusion with no evidence for ischemia or infarction.   . Renal artery stenosis     The patient has an occluded right renal artery and an atrophic right kidney. There is 20% left renal artery stenosis. This was seen by catheterization in 2007  . Diastolic heart failure     most recent echo (12/10) showed EF 55-60% with mild LVH, grade I diastolic dysfunction, mild LAE.  [name] Aortic valve disorder     Echo in 2009 showed mean aortic valve gradient of 13 mmHg, suggesting  very mild stenosis. Echo (12/10) suggested aortic sclerosis only  . Obesity   . Chronic kidney disease     most recent creatinine was 1.2  . Obstructive sleep apnea     PSG 01/05/2006 AHI 24.8, CPAP 9cm H2O  . Mild asthma     PFT 02/05/10 FEV1 1.38, FEV1% 73, TLC 3.78 (86%), DLCO 48%, +BD  . ACE-inhibitor cough   . Hypothyroidism   . Obesity hypoventilation syndrome        . Pulmonary nodule, right   . Carotid artery disease     mild, carotid dopplers 12/2009     Past Surgical History  Procedure Date  . Tubal ligation 1973  . Inner ear surgery 1979    right  . Abdominal hysterectomy 1985  . Coronary artery bypass graft 1996  . Cataract extraction 2010     Family History  Problem Relation Age of Onset  . Kidney failure Mother   . Diabetes Mother   . Aortic aneurysm Father   . Coronary artery disease Father   . Heart attack Father   . Diabetes Sister   . Diabetes Brother   . Kidney failure Brother      History   Social History  . Marital Status: Married    Spouse Name: N/A    Number of Children: N/A  . Years of Education: N/A   Occupational History  . Not on file.   Social History Main Topics  . Smoking status: Never Smoker   . Smokeless tobacco: Never Used  . Alcohol Use: No  . Drug Use: No  . Sexually Active: Not on file   Other Topics Concern  . Not on file   Social History Narrative  . No narrative on file      PHYSICAL EXAM   BP 132/84  Pulse 76  Ht 5\' 2"  (1.575 m)  Wt 254 lb (115.214 kg)  BMI 46.46 kg/m2 Constitutional: She is oriented to person, place, and time. She appears well-developed and well-nourished. No distress.  HENT: No nasal discharge.  Head: Normocephalic and atraumatic.  Eyes: Pupils are equal and round. Right eye exhibits no discharge. Left eye exhibits no discharge.  Neck: Normal range of motion. Neck supple. No JVD present. No thyromegaly present.  Cardiovascular: Normal rate, regular rhythm, normal heart sounds. Exam  reveals no gallop and no friction rub. No murmur heard.  Pulmonary/Chest:  Effort normal and breath sounds normal. No stridor. No respiratory distress. She has no wheezes. She has no rales. She exhibits no tenderness.  Abdominal: Soft. Bowel sounds are normal. She exhibits no distension. There is no tenderness. There is no rebound and no guarding.  Musculoskeletal: Normal range of motion. She exhibits +1 edema and no tenderness.  Neurological: She is alert and oriented to person, place, and time. Coordination normal.  Skin: Skin is warm and dry. No rash noted. She is not diaphoretic. No erythema. No pallor.  Psychiatric: She has a normal mood and affect. Her behavior is normal. Judgment and thought content normal.     EKG: Sinus  Rhythm  -  Nonspecific T-abnormality.    ASSESSMENT AND PLAN

## 2012-03-01 NOTE — Assessment & Plan Note (Signed)
Lab Results  Component Value Date   CHOL 121 05/10/2011   HDL 43 10/21/2011   LDLCALC 61 10/21/2011   TRIG 94 10/21/2011   CHOLHDL 2.9 10/21/2011   Most recent lipid profile was optimal and at target. Continue current dose of Crestor.

## 2012-03-01 NOTE — Assessment & Plan Note (Signed)
Blood pressure is well controlled 

## 2012-03-01 NOTE — Assessment & Plan Note (Signed)
She seems to be stable with no symptoms suggestive of angina. Normal myoview in 4/11.  Continue ASA, Crestor, Coreg, ARB.

## 2012-03-01 NOTE — Assessment & Plan Note (Signed)
Creatinine has been relatively stable around 1.5.

## 2012-03-01 NOTE — Assessment & Plan Note (Signed)
She appears to be euvolemic today. Renal function has been stable as well. Continue current dose of furosemide 40 mg once daily.

## 2012-07-26 ENCOUNTER — Ambulatory Visit (INDEPENDENT_AMBULATORY_CARE_PROVIDER_SITE_OTHER): Payer: Medicare Other | Admitting: Pulmonary Disease

## 2012-07-26 ENCOUNTER — Encounter: Payer: Self-pay | Admitting: Pulmonary Disease

## 2012-07-26 VITALS — BP 118/82 | HR 73 | Temp 97.5°F | Ht 62.0 in | Wt 255.2 lb

## 2012-07-26 DIAGNOSIS — R0602 Shortness of breath: Secondary | ICD-10-CM

## 2012-07-26 DIAGNOSIS — E662 Morbid (severe) obesity with alveolar hypoventilation: Secondary | ICD-10-CM

## 2012-07-26 DIAGNOSIS — G4733 Obstructive sleep apnea (adult) (pediatric): Secondary | ICD-10-CM

## 2012-07-26 DIAGNOSIS — J45909 Unspecified asthma, uncomplicated: Secondary | ICD-10-CM

## 2012-07-26 NOTE — Assessment & Plan Note (Addendum)
She is to continue 2 liters oxygen 24/7. 

## 2012-07-26 NOTE — Assessment & Plan Note (Signed)
Continue prn albuterol

## 2012-07-26 NOTE — Assessment & Plan Note (Signed)
She is doing well with CPAP, and reports compliance with therapy.

## 2012-07-26 NOTE — Progress Notes (Signed)
Chief Complaint  Patient presents with  . 6 mo. follow up    for OSA. Wears CPAP nightly x 8-10hr. Denies problems with mask or pressure. Pt is supposed to be getting a new mask through [facility]    History of Present Illness: Dawn Bradford is a 76 y.o. female with multifactorial dyspnea, mild/intermittent asthma, OSA/OHS, deconditioning, and diastolic dysfunction.  She has joined exercise program at hospital.  Her stamina has improved, but she still gets winded with too much activity.  He is not having cough, wheeze, or sputum.  She sporadically will need to use albuterol.  She is doing well with her CPAP.  She feels this is working well, and has no difficulty with her mask fit.  She continues with 2 liters oxygen 24/7.  Tests: PSG 01/05/06>>AHI 24.8  Echo 08/13/08>>EF 60%, mod LVH, mild AS, mild LA dilation, mild TR V/Q scan 01/26/10>>very low probability for PE PFT 02/05/10>>FEV1 1.38(77%), FEV1% 73, TLC 3.78(86%), DLCO 48%, +BD CT chest 05/31/11>>5 mm RUL nodule stable for 2 yrs Auto CPAP 08/08/11 to 08/22/11>>Used on 15 of 15 nights with average 10 hrs 6 min. Average AHI 3.8 with mean CPAP 8 cm H2O and 95th percentile CPAP 11 cm H2O.  Past Medical History  Diagnosis Date  . Hyperlipidemia   . Hypertension     resistant hyptertension times many years. the patient does have renal artery stenosis. she has tried calcium channel blockers in the past and states that she would not take them now because she had some problems with her gums which her dentist identified as calcium-channel blocker side effects.  . Diabetes mellitus     type 2  . Coronary artery disease     s/p anterior MI in 1996 followed by CABG. Lexiscan myoview (4/11): EF 67%, normal perfusion with no evidence for ischemia or infarction.   . Renal artery stenosis     The patient has an occluded right renal artery and an atrophic right kidney. There is 20% left renal artery stenosis. This was seen by  catheterization in 2007  . Diastolic heart failure     most recent echo (12/10) showed EF 55-60% with mild LVH, grade I diastolic dysfunction, mild LAE.  [name] Aortic valve disorder     Echo in 2009 showed mean aortic valve gradient of 13 mmHg, suggesting very mild stenosis. Echo (12/10) suggested aortic sclerosis only  . Obesity   . Chronic kidney disease     most recent creatinine was 1.2  . Obstructive sleep apnea     PSG 01/05/2006 AHI 24.8, CPAP 9cm H2O  . Mild asthma     PFT 02/05/10 FEV1 1.38, FEV1% 73, TLC 3.78 (86%), DLCO 48%, +BD  . ACE-inhibitor cough   . Hypothyroidism   . Obesity hypoventilation syndrome        . Pulmonary nodule, right   . Carotid artery disease     mild, carotid dopplers 12/2009    Past Surgical History  Procedure Date  . Tubal ligation 1973  . Inner ear surgery 1979    right  . Abdominal hysterectomy 1985  . Coronary artery bypass graft 1996  . Cataract extraction 2010    Current Outpatient Prescriptions on File Prior to Visit  Medication Sig Dispense Refill  . 5-Hydroxytryptophan (5-HTP PO) Take by mouth. As directed        . albuterol (VENTOLIN HFA) 108 (90 BASE) MCG/ACT inhaler Inhale 2 puffs into the lungs every 6 (six) hours as needed for  wheezing.  1 Inhaler  5  . aspirin 81 MG EC tablet Take 81 mg by mouth daily.        . carvedilol (COREG) 25 MG tablet Take 1 tablet (25 mg total) by mouth 2 (two) times daily with a meal.  180 tablet  3  . Cholecalciferol (D 5000 PO) Take by mouth as directed.        . co-enzyme Q-10 30 MG capsule 2 capsules once daily      . ferrous fumarate-iron polysaccharide complex (TANDEM) 162-115.2 MG CAPS Take 1 capsule by mouth daily with breakfast.        . ferrous gluconate (FERGON) 325 MG tablet Take 325 mg by mouth daily with breakfast.        . FLUoxetine (PROZAC) 40 MG capsule Take as directed      . furosemide (LASIX) 40 MG tablet Take 1 tablet (40 mg total) by mouth as directed. Alternate 40mg  once daily with  40mg  AM and 20mg  PM.  45 tablet  6  . hydrALAZINE (APRESOLINE) 25 MG tablet Take 25 mg by mouth 3 (three) times daily.        . insulin glargine (LANTUS) 100 UNIT/ML injection Inject 40 Units into the skin at bedtime.       [name] levothyroxine (SYNTHROID, LEVOTHROID) 50 MCG tablet Take 50 mcg by mouth daily.        [name] liothyronine (CYTOMEL) 5 MCG tablet Take 5 mcg by mouth 2 (two) times daily.       [name] MICARDIS 80 MG tablet TAKE ONE TABLET BY MOUTH EVERY DAY  30 each  5  . rosuvastatin (CRESTOR) 40 MG tablet Take 40 mg by mouth daily.        [name] Spacer/Aero-Holding Chambers (AEROCHAMBER MV) inhaler Use as instructed  1 each  0  . traZODone (DESYREL) 50 MG tablet Take 50 mg by mouth daily as needed.        . triamcinolone (KENALOG) 0.5 % cream as needed.        Allergies  Allergen Reactions  . Ace Inhibitors     unknown  . Amlodipine Besylate     "something happened to gums"  . Clonidine Hydrochloride   . Codeine Hives  . Metformin     REACTION: vomiting    Physical Exam: Filed Vitals:   07/26/12 1349  BP: 118/82  Pulse: 73  Temp: 97.5 F (36.4 C)  TempSrc: Oral  Height: 5\' 2"  (1.575 m)  Weight: 255 lb 3.2 oz (115.758 kg)  SpO2: 95%  ,  Current Encounter SPO2  07/26/12 1349 95%  01/18/12 1221 97%  08/04/11 1428 95%    Wt Readings from Last 3 Encounters:  07/26/12 255 lb 3.2 oz (115.758 kg)  03/01/12 254 lb (115.214 kg)  01/18/12 250 lb (113.399 kg)    Body mass index is 46.68 kg/(m^2).   General - No distress ENT - No sinus tenderness, no oral exudate, no LAN Cardiac - s1s2 regular, 2/6 SM Chest - No wheeze/rales/dullness, good air entry, normal respiratory excursion Back - No focal tenderness Abd - Soft, non-tender Ext - minimal ankle edema Neuro - Normal strength Skin - No rashes Psych - Normal mood, and behavior  Assessment/Plan:  Chesley Mires, MD [facility] Pager:  [phone] 07/26/2012, 2:05 PM

## 2012-07-26 NOTE — Patient Instructions (Signed)
Follow up in 6 months 

## 2012-07-26 NOTE — Assessment & Plan Note (Signed)
Stable.  Related to OHS/OSA, diastolic CHF, asthma, obesity/deconditioning.  Encouraged her to keep up with her exercise regimen.

## 2012-09-06 ENCOUNTER — Ambulatory Visit: Payer: Medicare Other | Admitting: Cardiovascular Disease

## 2012-09-28 ENCOUNTER — Encounter: Payer: Self-pay | Admitting: Cardiovascular Disease

## 2012-09-28 ENCOUNTER — Ambulatory Visit (INDEPENDENT_AMBULATORY_CARE_PROVIDER_SITE_OTHER): Payer: Medicare Other | Admitting: Cardiovascular Disease

## 2012-09-28 VITALS — BP 140/80 | HR 68 | Ht 63.0 in | Wt 251.2 lb

## 2012-09-28 DIAGNOSIS — R0602 Shortness of breath: Secondary | ICD-10-CM

## 2012-09-28 DIAGNOSIS — E785 Hyperlipidemia, unspecified: Secondary | ICD-10-CM

## 2012-09-28 DIAGNOSIS — I5032 Chronic diastolic (congestive) heart failure: Secondary | ICD-10-CM

## 2012-09-28 DIAGNOSIS — R079 Chest pain, unspecified: Secondary | ICD-10-CM

## 2012-09-28 DIAGNOSIS — I10 Essential (primary) hypertension: Secondary | ICD-10-CM

## 2012-09-28 DIAGNOSIS — I25810 Atherosclerosis of coronary artery bypass graft(s) without angina pectoris: Secondary | ICD-10-CM

## 2012-09-28 NOTE — Assessment & Plan Note (Signed)
Continue treatment with rosuvastatin.

## 2012-09-28 NOTE — Assessment & Plan Note (Signed)
She appears to be euvolemic on current dose of Lasix 40 mg once daily. She does have chronic kidney disease. She has an appointment next month with Dr. Holley Raring.

## 2012-09-28 NOTE — Assessment & Plan Note (Signed)
She is doing well at this time with no symptoms suggestive of angina. Continue medical therapy.

## 2012-09-28 NOTE — Assessment & Plan Note (Signed)
Blood pressure is reasonably controlled on current medications. 

## 2012-09-28 NOTE — Patient Instructions (Addendum)
Continue same medications.  Follow up in 6 months.  

## 2012-09-28 NOTE — Progress Notes (Signed)
HPI  This is a 77 year old with history of coronary artery disease status post CABG, resistant hypertension, renal artery stenosis, morbid obesity, diastolic congestive heart failure, CKD, and OHS/OSA who presents to Cardiology Clinic for followup.  In 2/12, she was hospitalized at [facility] with overdiuresis and acute renal failure after being put on daily metolozone. She is using home oxygen with exertion for OHS and CPAP at night for OSA.  Overall, she has been doing reasonably well since last visit. No hospitalizations. She continues to 10 pulmonary rehabilitation. she has chronic kidney disease which has been stable. She follows with Dr. Holley Raring.   Allergies  Allergen Reactions  . Ace Inhibitors     unknown  . Amlodipine Besylate     "something happened to gums"  . Clonidine Hydrochloride   . Codeine Hives  . Metformin     REACTION: vomiting     Current Outpatient Prescriptions on File Prior to Visit  Medication Sig Dispense Refill  . 5-Hydroxytryptophan (5-HTP PO) Take by mouth. As directed        . albuterol (VENTOLIN HFA) 108 (90 BASE) MCG/ACT inhaler Inhale 2 puffs into the lungs every 6 (six) hours as needed for wheezing.  1 Inhaler  5  . aspirin 81 MG EC tablet Take 81 mg by mouth daily.        . carvedilol (COREG) 25 MG tablet Take 1 tablet (25 mg total) by mouth 2 (two) times daily with a meal.  180 tablet  3  . Cholecalciferol (D 5000 PO) Take by mouth as directed.        . co-enzyme Q-10 30 MG capsule 2 capsules once daily      . ferrous fumarate-iron polysaccharide complex (TANDEM) 162-115.2 MG CAPS Take 1 capsule by mouth daily with breakfast.        . ferrous gluconate (FERGON) 325 MG tablet Take 325 mg by mouth daily with breakfast.        . FLUoxetine (PROZAC) 40 MG capsule Take as directed      . furosemide (LASIX) 40 MG tablet Take 40 mg by mouth as directed.      . hydrALAZINE (APRESOLINE) 25 MG tablet Take 25 mg by mouth 3 (three) times daily.        . insulin  glargine (LANTUS) 100 UNIT/ML injection Inject 40 Units into the skin at bedtime.       [name] levothyroxine (SYNTHROID, LEVOTHROID) 50 MCG tablet Take 50 mcg by mouth daily.        [name] liothyronine (CYTOMEL) 5 MCG tablet Take 5 mcg by mouth 2 (two) times daily.       [name] MICARDIS 80 MG tablet TAKE ONE TABLET BY MOUTH EVERY DAY  30 each  5  . rosuvastatin (CRESTOR) 40 MG tablet Take 40 mg by mouth daily.        [name] Spacer/Aero-Holding Chambers (AEROCHAMBER MV) inhaler Use as instructed  1 each  0  . traZODone (DESYREL) 50 MG tablet Take 50 mg by mouth daily as needed.        . triamcinolone (KENALOG) 0.5 % cream as needed.         Past Medical History  Diagnosis Date  . Hyperlipidemia   . Hypertension     resistant hyptertension times many years. the patient does have renal artery stenosis. she has tried calcium channel blockers in the past and states that she would not take them now because she had some problems with her gums which  her dentist identified as calcium-channel blocker side effects.  . Diabetes mellitus     type 2  . Coronary artery disease     s/p anterior MI in 1996 followed by CABG. Lexiscan myoview (4/11): EF 67%, normal perfusion with no evidence for ischemia or infarction.   . Renal artery stenosis     The patient has an occluded right renal artery and an atrophic right kidney. There is 20% left renal artery stenosis. This was seen by catheterization in 2007  . Diastolic heart failure     most recent echo (12/10) showed EF 55-60% with mild LVH, grade I diastolic dysfunction, mild LAE.  [name] Aortic valve disorder     Echo in 2009 showed mean aortic valve gradient of 13 mmHg, suggesting very mild stenosis. Echo (12/10) suggested aortic sclerosis only  . Obesity   . Chronic kidney disease     most recent creatinine was 1.2  . Obstructive sleep apnea     PSG 01/05/2006 AHI 24.8, CPAP 9cm H2O  . Mild asthma     PFT 02/05/10 FEV1 1.38, FEV1% 73, TLC 3.78 (86%), DLCO 48%, +BD  .  ACE-inhibitor cough   . Hypothyroidism   . Obesity hypoventilation syndrome        . Pulmonary nodule, right   . Carotid artery disease     mild, carotid dopplers 12/2009     Past Surgical History  Procedure Date  . Tubal ligation 1973  . Inner ear surgery 1979    right  . Abdominal hysterectomy 1985  . Coronary artery bypass graft 1996  . Cataract extraction 2010     Family History  Problem Relation Age of Onset  . Kidney failure Mother   . Diabetes Mother   . Aortic aneurysm Father   . Coronary artery disease Father   . Heart attack Father   . Diabetes Sister   . Diabetes Brother   . Kidney failure Brother      History   Social History  . Marital Status: Married    Spouse Name: N/A    Number of Children: N/A  . Years of Education: N/A   Occupational History  . Not on file.   Social History Main Topics  . Smoking status: Never Smoker   . Smokeless tobacco: Never Used  . Alcohol Use: No  . Drug Use: No  . Sexually Active: Not on file   Other Topics Concern  . Not on file   Social History Narrative  . No narrative on file      PHYSICAL EXAM   BP 140/80  Pulse 68  Ht 5\' 3"  (1.6 m)  Wt 251 lb 4 oz (113.966 kg)  BMI 44.51 kg/m2 Constitutional: She is oriented to person, place, and time. She appears well-developed and well-nourished. No distress.  HENT: No nasal discharge.  Head: Normocephalic and atraumatic.  Eyes: Pupils are equal and round. Right eye exhibits no discharge. Left eye exhibits no discharge.  Neck: Normal range of motion. Neck supple. No JVD present. No thyromegaly present.  Cardiovascular: Normal rate, regular rhythm, normal heart sounds. Exam reveals no gallop and no friction rub. No murmur heard.  Pulmonary/Chest: Effort normal and breath sounds normal. No stridor. No respiratory distress. She has no wheezes. She has no rales. She exhibits no tenderness.  Abdominal: Soft. Bowel sounds are normal. She exhibits no distension.  There is no tenderness. There is no rebound and no guarding.  Musculoskeletal: Normal range of motion. She exhibits +1  edema and no tenderness.  Neurological: She is alert and oriented to person, place, and time. Coordination normal.  Skin: Skin is warm and dry. No rash noted. She is not diaphoretic. No erythema. No pallor.  Psychiatric: She has a normal mood and affect. Her behavior is normal. Judgment and thought content normal.     EKG: Sinus  Rhythm  -  Nonspecific T-abnormality.    ASSESSMENT AND PLAN

## 2013-01-24 ENCOUNTER — Ambulatory Visit: Payer: Self-pay | Admitting: Family Medicine

## 2013-01-30 ENCOUNTER — Ambulatory Visit: Payer: Medicare Other | Admitting: Pulmonary Disease

## 2013-03-29 ENCOUNTER — Encounter: Payer: Self-pay | Admitting: Cardiovascular Disease

## 2013-03-29 ENCOUNTER — Ambulatory Visit (INDEPENDENT_AMBULATORY_CARE_PROVIDER_SITE_OTHER): Payer: Medicare Other | Admitting: Cardiovascular Disease

## 2013-03-29 VITALS — BP 138/68 | HR 72 | Ht 62.0 in | Wt 254.8 lb

## 2013-03-29 DIAGNOSIS — I10 Essential (primary) hypertension: Secondary | ICD-10-CM

## 2013-03-29 DIAGNOSIS — I25810 Atherosclerosis of coronary artery bypass graft(s) without angina pectoris: Secondary | ICD-10-CM

## 2013-03-29 DIAGNOSIS — E785 Hyperlipidemia, unspecified: Secondary | ICD-10-CM

## 2013-03-29 DIAGNOSIS — I5032 Chronic diastolic (congestive) heart failure: Secondary | ICD-10-CM

## 2013-03-29 NOTE — Assessment & Plan Note (Signed)
She is doing reasonably well from a cardiac standpoint with no symptoms suggestive of angina. Continue medical therapy.

## 2013-03-29 NOTE — Assessment & Plan Note (Signed)
She appears to be euvolemic with a relatively stable renal function.

## 2013-03-29 NOTE — Assessment & Plan Note (Signed)
Recent lipid profile was reviewed as outlined above. Continue treatment with rosuvastatin.

## 2013-03-29 NOTE — Assessment & Plan Note (Signed)
Blood pressure is well controlled on current medications. 

## 2013-03-29 NOTE — Progress Notes (Signed)
HPI  This is a 77 year old with history of coronary artery disease status post CABG, resistant hypertension, renal artery stenosis, morbid obesity, diastolic congestive heart failure, CKD, and OHS/OSA who presents to Cardiology Clinic for followup.  In 2/12, she was hospitalized at [facility] with overdiuresis and acute renal failure after being put on daily metolozone. She is using home oxygen with exertion for OHS and CPAP at night for OSA.  She has been doing reasonably well since her last visit. She denies any chest pain. Dyspnea is stable. She had labs done in April which showed stable renal function with a creatinine of 1.8. Thyroid function was normal. Hemoglobin A1c was 8.5. Total cholesterol was 122 with an LDL of 58. Liver enzymes were normal.   Allergies  Allergen Reactions  . Ace Inhibitors     unknown  . Amlodipine Besylate     "something happened to gums"  . Clonidine Hydrochloride   . Codeine Hives  . Metformin     REACTION: vomiting     Current Outpatient Prescriptions on File Prior to Visit  Medication Sig Dispense Refill  . 5-Hydroxytryptophan (5-HTP PO) Take by mouth. As directed        . albuterol (VENTOLIN HFA) 108 (90 BASE) MCG/ACT inhaler Inhale 2 puffs into the lungs every 6 (six) hours as needed for wheezing.  1 Inhaler  5  . aspirin 81 MG EC tablet Take 81 mg by mouth daily.        . carvedilol (COREG) 25 MG tablet Take 1 tablet (25 mg total) by mouth 2 (two) times daily with a meal.  180 tablet  3  . Cholecalciferol (D 5000 PO) Take by mouth as directed.        . co-enzyme Q-10 30 MG capsule 2 capsules once daily      . ferrous fumarate-iron polysaccharide complex (TANDEM) 162-115.2 MG CAPS Take 1 capsule by mouth daily with breakfast.        . ferrous gluconate (FERGON) 325 MG tablet Take 325 mg by mouth daily with breakfast.        . FLUoxetine (PROZAC) 40 MG capsule Take as directed      . furosemide (LASIX) 40 MG tablet Take 40 mg by mouth as directed.       . hydrALAZINE (APRESOLINE) 25 MG tablet Take 25 mg by mouth 3 (three) times daily.        . insulin glargine (LANTUS) 100 UNIT/ML injection Inject 40 Units into the skin at bedtime.       [name] levothyroxine (SYNTHROID, LEVOTHROID) 50 MCG tablet Take 50 mcg by mouth daily.        [name] liothyronine (CYTOMEL) 5 MCG tablet Take 5 mcg by mouth 2 (two) times daily.       [name] MICARDIS 80 MG tablet TAKE ONE TABLET BY MOUTH EVERY DAY  30 each  5  . rosuvastatin (CRESTOR) 40 MG tablet Take 40 mg by mouth daily.        . traZODone (DESYREL) 50 MG tablet Take 50 mg by mouth daily as needed.        . triamcinolone (KENALOG) 0.5 % cream as needed.       No current facility-administered medications on file prior to visit.     Past Medical History  Diagnosis Date  . Hyperlipidemia   . Hypertension     resistant hyptertension times many years. the patient does have renal artery stenosis. she has tried calcium channel blockers in the  past and states that she would not take them now because she had some problems with her gums which her dentist identified as calcium-channel blocker side effects.  . Diabetes mellitus     type 2  . Coronary artery disease     s/p anterior MI in 1996 followed by CABG. Lexiscan myoview (4/11): EF 67%, normal perfusion with no evidence for ischemia or infarction.   . Renal artery stenosis     The patient has an occluded right renal artery and an atrophic right kidney. There is 20% left renal artery stenosis. This was seen by catheterization in 2007  . Diastolic heart failure     most recent echo (12/10) showed EF 55-60% with mild LVH, grade I diastolic dysfunction, mild LAE.  [name] Aortic valve disorder     Echo in 2009 showed mean aortic valve gradient of 13 mmHg, suggesting very mild stenosis. Echo (12/10) suggested aortic sclerosis only  . Obesity   . Chronic kidney disease     most recent creatinine was 1.2  . Obstructive sleep apnea     PSG 01/05/2006 AHI 24.8, CPAP 9cm H2O  .  Mild asthma     PFT 02/05/10 FEV1 1.38, FEV1% 73, TLC 3.78 (86%), DLCO 48%, +BD  . ACE-inhibitor cough   . Hypothyroidism   . Obesity hypoventilation syndrome        . Pulmonary nodule, right   . Carotid artery disease     mild, carotid dopplers 12/2009     Past Surgical History  Procedure Laterality Date  . Tubal ligation  1973  . Inner ear surgery  1979    right  . Abdominal hysterectomy  1985  . Coronary artery bypass graft  1996  . Cataract extraction  2010     Family History  Problem Relation Age of Onset  . Kidney failure Mother   . Diabetes Mother   . Aortic aneurysm Father   . Coronary artery disease Father   . Heart attack Father   . Diabetes Sister   . Diabetes Brother   . Kidney failure Brother      History   Social History  . Marital Status: Married    Spouse Name: N/A    Number of Children: N/A  . Years of Education: N/A   Occupational History  . Not on file.   Social History Main Topics  . Smoking status: Never Smoker   . Smokeless tobacco: Never Used  . Alcohol Use: No  . Drug Use: No  . Sexually Active: Not on file   Other Topics Concern  . Not on file   Social History Narrative  . No narrative on file      PHYSICAL EXAM   BP 138/68  Pulse 72  Ht 5\' 2"  (1.575 m)  Wt 254 lb 12 oz (115.554 kg)  BMI 46.58 kg/m2 Constitutional: She is oriented to person, place, and time. She appears well-developed and well-nourished. No distress.  HENT: No nasal discharge.  Head: Normocephalic and atraumatic.  Eyes: Pupils are equal and round. Right eye exhibits no discharge. Left eye exhibits no discharge.  Neck: Normal range of motion. Neck supple. No JVD present. No thyromegaly present.  Cardiovascular: Normal rate, regular rhythm, normal heart sounds. Exam reveals no gallop and no friction rub. No murmur heard.  Pulmonary/Chest: Effort normal and breath sounds normal. No stridor. No respiratory distress. She has no wheezes. She has no rales.  She exhibits no tenderness.  Abdominal: Soft. Bowel sounds are  normal. She exhibits no distension. There is no tenderness. There is no rebound and no guarding.  Musculoskeletal: Normal range of motion. She exhibits +1 edema and no tenderness.  Neurological: She is alert and oriented to person, place, and time. Coordination normal.  Skin: Skin is warm and dry. No rash noted. She is not diaphoretic. No erythema. No pallor.  Psychiatric: She has a normal mood and affect. Her behavior is normal. Judgment and thought content normal.     EKG: Sinus  Rhythm  Low voltage in precordial leads.   -  Nonspecific T-abnormality.   ABNORMAL    ASSESSMENT AND PLAN

## 2013-03-29 NOTE — Patient Instructions (Addendum)
Continue same medications.  Follow up in 6 months.  

## 2013-05-03 ENCOUNTER — Other Ambulatory Visit: Payer: Self-pay | Admitting: Cardiovascular Disease

## 2013-06-20 ENCOUNTER — Ambulatory Visit (INDEPENDENT_AMBULATORY_CARE_PROVIDER_SITE_OTHER): Payer: Medicare Other | Admitting: Pulmonary Disease

## 2013-06-20 ENCOUNTER — Encounter: Payer: Self-pay | Admitting: Pulmonary Disease

## 2013-06-20 VITALS — BP 120/74 | HR 66 | Temp 98.0°F | Ht 62.5 in | Wt 259.0 lb

## 2013-06-20 DIAGNOSIS — I10 Essential (primary) hypertension: Secondary | ICD-10-CM

## 2013-06-20 DIAGNOSIS — J45909 Unspecified asthma, uncomplicated: Secondary | ICD-10-CM

## 2013-06-20 DIAGNOSIS — J4520 Mild intermittent asthma, uncomplicated: Secondary | ICD-10-CM

## 2013-06-20 DIAGNOSIS — E662 Morbid (severe) obesity with alveolar hypoventilation: Secondary | ICD-10-CM

## 2013-06-20 DIAGNOSIS — G4733 Obstructive sleep apnea (adult) (pediatric): Secondary | ICD-10-CM

## 2013-06-20 NOTE — Patient Instructions (Signed)
Follow up in 6 months 

## 2013-06-20 NOTE — Progress Notes (Signed)
Chief Complaint  Patient presents with  . Asthma    Breathing is unchanged. Reports SOB, chest tightness and coughing. Denies wheezing.    History of Present Illness: Dawn Bradford is a 77 y.o. female with multifactorial dyspnea, mild/intermittent asthma, OSA/OHS, deconditioning, and diastolic dysfunction.  She has developed more trouble with knee pain.  This has limited her exercise.  As a result she has gained weight.  She is doing well with her CPAP.  She feels this is working well, and has no difficulty with her mask fit.  She continues with 2 liters oxygen 24/7.  She denies cough, wheeze, or sputum.  She is not needing to use albuterol much.  Tests: PSG 01/05/06>>AHI 24.8  Echo 08/13/08>>EF 60%, mod LVH, mild AS, mild LA dilation, mild TR V/Q scan 01/26/10>>very low probability for PE PFT 02/05/10>>FEV1 1.38(77%), FEV1% 73, TLC 3.78(86%), DLCO 48%, +BD CT chest 05/31/11>>5 mm RUL nodule stable for 2 yrs Auto CPAP 08/08/11 to 08/22/11>>Used on 15 of 15 nights with average 10 hrs 6 min. Average AHI 3.8 with mean CPAP 8 cm H2O and 95th percentile CPAP 11 cm H2O.  She  has a past medical history of Hyperlipidemia; Hypertension; Diabetes mellitus; Coronary artery disease; Renal artery stenosis; Diastolic heart failure; Aortic valve disorder; Obesity; Chronic kidney disease; Obstructive sleep apnea; Mild asthma; ACE-inhibitor cough; Hypothyroidism; Obesity hypoventilation syndrome; Pulmonary nodule, right; and Carotid artery disease.  She  has past surgical history that includes Tubal ligation (1973); Inner ear surgery (1979); Abdominal hysterectomy (1985); Coronary artery bypass graft (1996); and Cataract extraction (2010).   Current Outpatient Prescriptions on File Prior to Visit  Medication Sig Dispense Refill  . 5-Hydroxytryptophan (5-HTP PO) Take by mouth. As directed        . aspirin 81 MG EC tablet Take 81 mg by mouth daily.        . carvedilol (COREG) 25 MG tablet TAKE ONE  TABLET BY MOUTH TWICE DAILY WITH MEALS  180 tablet  0  . Cholecalciferol (D 5000 PO) Take by mouth as directed.        . co-enzyme Q-10 30 MG capsule 2 capsules once daily      . ferrous fumarate-iron polysaccharide complex (TANDEM) 162-115.2 MG CAPS Take 1 capsule by mouth daily with breakfast.        . ferrous gluconate (FERGON) 325 MG tablet Take 325 mg by mouth daily with breakfast.        . furosemide (LASIX) 40 MG tablet Take 40 mg by mouth as directed.      . hydrALAZINE (APRESOLINE) 25 MG tablet Take 25 mg by mouth 3 (three) times daily.        . insulin glargine (LANTUS) 100 UNIT/ML injection Inject 38 Units into the skin at bedtime.       [name] MICARDIS 80 MG tablet TAKE ONE TABLET BY MOUTH EVERY DAY  30 each  5  . rosuvastatin (CRESTOR) 40 MG tablet Take 40 mg by mouth daily.        . traZODone (DESYREL) 50 MG tablet Take 50 mg by mouth daily as needed.        . triamcinolone (KENALOG) 0.5 % cream as needed.      [name] albuterol (VENTOLIN HFA) 108 (90 BASE) MCG/ACT inhaler Inhale 2 puffs into the lungs every 6 (six) hours as needed for wheezing.  1 Inhaler  5   No current facility-administered medications on file prior to visit.    Allergies  Allergen Reactions  .  Ace Inhibitors     unknown  . Amlodipine Besylate     "something happened to gums"  . Clonidine Hydrochloride   . Codeine Hives  . Metformin     REACTION: vomiting    Physical Exam:  General - No distress ENT - No sinus tenderness, no oral exudate, no LAN Cardiac - s1s2 regular, 2/6 SM Chest - No wheeze/rales/dullness, good air entry, normal respiratory excursion Back - No focal tenderness Abd - Soft, non-tender Ext - minimal ankle edema Neuro - Normal strength Skin - No rashes Psych - Normal mood, and behavior  Assessment/Plan:  Chesley Mires, MD [facility] Pager:  [phone] 06/20/2013, 2:46 PM

## 2013-06-20 NOTE — Assessment & Plan Note (Signed)
Continue prn albuterol

## 2013-06-20 NOTE — Assessment & Plan Note (Signed)
Advised her to try spacing her blood pressure medicines through out the day more to avoid trouble with dizziness.

## 2013-06-20 NOTE — Assessment & Plan Note (Signed)
She is doing well with CPAP, and reports compliance with therapy.

## 2013-06-20 NOTE — Assessment & Plan Note (Signed)
She is to continue 2 liters oxygen 24/7. 

## 2013-08-02 ENCOUNTER — Other Ambulatory Visit: Payer: Self-pay | Admitting: Cardiovascular Disease

## 2013-09-26 ENCOUNTER — Ambulatory Visit (INDEPENDENT_AMBULATORY_CARE_PROVIDER_SITE_OTHER): Payer: Medicare Other | Admitting: Cardiovascular Disease

## 2013-09-26 ENCOUNTER — Encounter: Payer: Self-pay | Admitting: Cardiovascular Disease

## 2013-09-26 ENCOUNTER — Other Ambulatory Visit (INDEPENDENT_AMBULATORY_CARE_PROVIDER_SITE_OTHER): Payer: Medicare Other

## 2013-09-26 VITALS — BP 143/69 | HR 69 | Ht 62.0 in | Wt 257.5 lb

## 2013-09-26 DIAGNOSIS — E785 Hyperlipidemia, unspecified: Secondary | ICD-10-CM

## 2013-09-26 DIAGNOSIS — I25810 Atherosclerosis of coronary artery bypass graft(s) without angina pectoris: Secondary | ICD-10-CM

## 2013-09-26 DIAGNOSIS — I359 Nonrheumatic aortic valve disorder, unspecified: Secondary | ICD-10-CM

## 2013-09-26 DIAGNOSIS — R0602 Shortness of breath: Secondary | ICD-10-CM

## 2013-09-26 DIAGNOSIS — I10 Essential (primary) hypertension: Secondary | ICD-10-CM

## 2013-09-26 DIAGNOSIS — I350 Nonrheumatic aortic (valve) stenosis: Secondary | ICD-10-CM

## 2013-09-26 NOTE — Progress Notes (Signed)
HPI  This is a 78 year old with history of coronary artery disease status post CABG, resistant hypertension, renal artery stenosis, morbid obesity, diastolic congestive heart failure, CKD, and OHS/OSA who presents to Cardiology Clinic for followup.  In 2/12, she was hospitalized at [facility] with overdiuresis and acute renal failure after being put on daily metolozone. She is using home oxygen with exertion for OHS and CPAP at night for OSA.  She has been doing reasonably well since her last visit.  She had labs done in April which showed stable renal function with a creatinine of 1.8. She follows with Dr. Holley Raring. She is known to have occluded right renal artery with mild left renal artery stenosis.  Thyroid function was normal. Hemoglobin A1c was 8.5. Total cholesterol was 122 with an LDL of 58. Liver enzymes were normal. She reports worsening dyspnea without chest discomfort. No orthopnea or PND.   Allergies  Allergen Reactions  . Ace Inhibitors     unknown  . Amlodipine Besylate     "something happened to gums"  . Clonidine Hydrochloride   . Codeine Hives  . Metformin     REACTION: vomiting     Current Outpatient Prescriptions on File Prior to Visit  Medication Sig Dispense Refill  . 5-Hydroxytryptophan (5-HTP PO) Take by mouth. As directed        . albuterol (VENTOLIN HFA) 108 (90 BASE) MCG/ACT inhaler Inhale 2 puffs into the lungs every 6 (six) hours as needed for wheezing.  1 Inhaler  5  . aspirin 81 MG EC tablet Take 81 mg by mouth daily.        . carvedilol (COREG) 25 MG tablet TAKE ONE TABLET BY MOUTH TWICE DAILY WITH MEALS  180 tablet  0  . Cholecalciferol (D 5000 PO) Take by mouth as directed.        . co-enzyme Q-10 30 MG capsule 2 capsules once daily      . ferrous fumarate-iron polysaccharide complex (TANDEM) 162-115.2 MG CAPS Take 1 capsule by mouth daily with breakfast.        . ferrous gluconate (FERGON) 325 MG tablet Take 325 mg by mouth daily with breakfast.          . FLUoxetine (PROZAC) 10 MG tablet Take 10 mg by mouth daily.      . furosemide (LASIX) 40 MG tablet Take 40 mg by mouth as directed.      [name] HUMALOG KWIKPEN 100 UNIT/ML SOPN Inject 10 Units into the skin 2 (two) times daily before a meal.      . hydrALAZINE (APRESOLINE) 25 MG tablet Take 25 mg by mouth 3 (three) times daily.        . insulin glargine (LANTUS) 100 UNIT/ML injection Inject 38 Units into the skin at bedtime.       [name] levothyroxine (SYNTHROID, LEVOTHROID) 75 MCG tablet Take 75 mcg by mouth daily before breakfast.      . MICARDIS 80 MG tablet TAKE ONE TABLET BY MOUTH EVERY DAY  30 each  5  . rosuvastatin (CRESTOR) 40 MG tablet Take 40 mg by mouth daily.        . traZODone (DESYREL) 50 MG tablet Take 50 mg by mouth daily as needed.        . triamcinolone (KENALOG) 0.5 % cream as needed.       No current facility-administered medications on file prior to visit.     Past Medical History  Diagnosis Date  . Hyperlipidemia   .  Hypertension     resistant hyptertension times many years. the patient does have renal artery stenosis. she has tried calcium channel blockers in the past and states that she would not take them now because she had some problems with her gums which her dentist identified as calcium-channel blocker side effects.  . Diabetes mellitus     type 2  . Coronary artery disease     s/p anterior MI in 1996 followed by CABG. Lexiscan myoview (4/11): EF 67%, normal perfusion with no evidence for ischemia or infarction.   . Renal artery stenosis     The patient has an occluded right renal artery and an atrophic right kidney. There is 20% left renal artery stenosis. This was seen by catheterization in 2007  . Diastolic heart failure     most recent echo (12/10) showed EF 55-60% with mild LVH, grade I diastolic dysfunction, mild LAE.  [name] Aortic valve disorder     Echo in 2009 showed mean aortic valve gradient of 13 mmHg, suggesting very mild stenosis. Echo (12/10) suggested  aortic sclerosis only  . Obesity   . Chronic kidney disease     most recent creatinine was 1.2  . Obstructive sleep apnea     PSG 01/05/2006 AHI 24.8, CPAP 9cm H2O  . Mild asthma     PFT 02/05/10 FEV1 1.38, FEV1% 73, TLC 3.78 (86%), DLCO 48%, +BD  . ACE-inhibitor cough   . Hypothyroidism   . Obesity hypoventilation syndrome        . Pulmonary nodule, right   . Carotid artery disease     mild, carotid dopplers 12/2009     Past Surgical History  Procedure Laterality Date  . Tubal ligation  1973  . Inner ear surgery  1979    right  . Abdominal hysterectomy  1985  . Coronary artery bypass graft  1996  . Cataract extraction  2010     Family History  Problem Relation Age of Onset  . Kidney failure Mother   . Diabetes Mother   . Aortic aneurysm Father   . Coronary artery disease Father   . Heart attack Father   . Diabetes Sister   . Diabetes Brother   . Kidney failure Brother      History   Social History  . Marital Status: Married    Spouse Name: N/A    Number of Children: N/A  . Years of Education: N/A   Occupational History  . Not on file.   Social History Main Topics  . Smoking status: Never Smoker   . Smokeless tobacco: Never Used  . Alcohol Use: No  . Drug Use: No  . Sexual Activity: Not on file   Other Topics Concern  . Not on file   Social History Narrative  . No narrative on file      PHYSICAL EXAM   BP 143/69  Pulse 69  Ht 5\' 2"  (1.575 m)  Wt 257 lb 8 oz (116.801 kg)  BMI 47.09 kg/m2  SpO2 93% Constitutional: She is oriented to person, place, and time. She appears well-developed and well-nourished. No distress.  HENT: No nasal discharge.  Head: Normocephalic and atraumatic.  Eyes: Pupils are equal and round. Right eye exhibits no discharge. Left eye exhibits no discharge.  Neck: Normal range of motion. Neck supple. No JVD present. No thyromegaly present.  Cardiovascular: Normal rate, regular rhythm, normal heart sounds. Exam reveals no  gallop and no friction rub. There is a 2/6 systolic ejection  murmur at the aortic area which is early peaking with preserved S2.  Pulmonary/Chest: Effort normal and breath sounds normal. No stridor. No respiratory distress. She has no wheezes. She has no rales. She exhibits no tenderness.  Abdominal: Soft. Bowel sounds are normal. She exhibits no distension. There is no tenderness. There is no rebound and no guarding.  Musculoskeletal: Normal range of motion. She exhibits +1 edema and no tenderness.  Neurological: She is alert and oriented to person, place, and time. Coordination normal.  Skin: Skin is warm and dry. No rash noted. She is not diaphoretic. No erythema. No pallor.  Psychiatric: She has a normal mood and affect. Her behavior is normal. Judgment and thought content normal.     [id number]  Rhythm  -Prominent R(V1) and right axis -consider right ventricular hypertrophy  -consider pulmonary disease.   ABNORMAL    ASSESSMENT AND PLAN

## 2013-09-26 NOTE — Patient Instructions (Addendum)
Your physician has requested that you have an echocardiogram. Echocardiography is a painless test that uses sound waves to create images of your heart. It provides your doctor with information about the size and shape of your heart and how well your heart's chambers and valves are working. This procedure takes approximately one hour. There are no restrictions for this procedure.  Your physician wants you to follow-up in: 6 months. You will receive a reminder letter in the mail two months in advance. If you don't receive a letter, please call our office to schedule the follow-up appointment.  [place]  Your caregiver has ordered a Stress Test with nuclear imaging. The purpose of this test is to evaluate the blood supply to your heart muscle. This procedure is referred to as a "Non-Invasive Stress Test." This is because other than having an IV started in your vein, nothing is inserted or "invades" your body. Cardiac stress tests are done to find areas of poor blood flow to the heart by determining the extent of coronary artery disease (CAD). Some patients exercise on a treadmill, which naturally increases the blood flow to your heart, while others who are  unable to walk on a treadmill due to physical limitations have a pharmacologic/chemical stress agent called Lexiscan . This medicine will mimic walking on a treadmill by temporarily increasing your coronary blood flow.   Please note: these test may take anywhere between 2-4 hours to complete  PLEASE REPORT TO [facility] AT THE FIRST DESK WILL DIRECT YOU WHERE TO GO  Date of Procedure:____________01/27/15_________________________  Arrival Time for Procedure:__________10:15 AM____________________  Instructions regarding medication:   __x__ : Hold diabetes medication morning of procedure   PLEASE NOTIFY THE OFFICE AT LEAST 24 HOURS IN ADVANCE IF YOU ARE UNABLE TO KEEP YOUR APPOINTMENT.  [phone] AND   PLEASE NOTIFY NUCLEAR MEDICINE AT [facility] AT LEAST 24 HOURS IN ADVANCE IF YOU ARE UNABLE TO KEEP YOUR APPOINTMENT. [phone]  How to prepare for your Myoview test:  1. Do not eat or drink after midnight 2. No caffeine for 24 hours prior to test 3. No smoking 24 hours prior to test. 4. Your medication may be taken with water.  If your doctor stopped a medication because of this test, do not take that medication. 5. Ladies, please do not wear dresses.  Skirts or pants are appropriate. Please wear a short sleeve shirt. 6. No perfume, cologne or lotion. 7. Wear comfortable walking shoes. No heels!

## 2013-09-27 NOTE — Assessment & Plan Note (Signed)
Blood pressure is reasonably controlled on current medications. 

## 2013-09-27 NOTE — Assessment & Plan Note (Signed)
This has been under excellent control with high-dose Crestor.

## 2013-09-27 NOTE — Assessment & Plan Note (Signed)
She reports worsening exertional dyspnea without chest pain. No recent ischemic cardiac evaluation. I recommend a pharmacologic nuclear stress test for evaluation.

## 2013-09-27 NOTE — Assessment & Plan Note (Signed)
No recent echocardiogram. Previous echocardiogram in 2010 suggested very mild aortic stenosis. Given worsening dyspnea, I will obtain an echocardiogram.

## 2013-10-01 ENCOUNTER — Ambulatory Visit: Payer: Self-pay | Admitting: Cardiovascular Disease

## 2013-10-01 DIAGNOSIS — R0609 Other forms of dyspnea: Secondary | ICD-10-CM

## 2013-10-01 DIAGNOSIS — R0989 Other specified symptoms and signs involving the circulatory and respiratory systems: Secondary | ICD-10-CM

## 2013-10-02 ENCOUNTER — Other Ambulatory Visit: Payer: Self-pay

## 2013-10-02 DIAGNOSIS — I25810 Atherosclerosis of coronary artery bypass graft(s) without angina pectoris: Secondary | ICD-10-CM

## 2013-10-02 DIAGNOSIS — R0602 Shortness of breath: Secondary | ICD-10-CM

## 2013-11-06 ENCOUNTER — Telehealth: Payer: Self-pay | Admitting: Pulmonary Disease

## 2013-11-06 ENCOUNTER — Other Ambulatory Visit: Payer: Self-pay | Admitting: *Deleted

## 2013-11-06 MED ORDER — CARVEDILOL 25 MG PO TABS: ORAL_TABLET | ORAL | Status: DC

## 2013-11-06 NOTE — Telephone Encounter (Signed)
Requested Prescriptions   Signed Prescriptions Disp Refills  . carvedilol (COREG) 25 MG tablet 180 tablet 3    Sig: TAKE ONE TABLET BY MOUTH TWICE DAILY WITH MEALS    Authorizing Provider: Kathlyn Sacramento A    Ordering User: Britt Bottom

## 2013-11-06 NOTE — Telephone Encounter (Signed)
Auto CPAP 10/25/13 to 11/05/13 >> Used on 12 of 12 nights with average 10 hrs 46 min.  Average AHI 3.3 with mean CPAP 9 cm H2O and 90 th percentile CPAP 12 cm H2O.  Will have my nurse inform pt that CPAP report looks good.  No change to current set up needed.

## 2013-11-07 NOTE — Telephone Encounter (Signed)
Pt is aware of CPAP download results. Asks that we place an order for a new machine.  Dr Halford Chessman is this okay?

## 2013-11-11 NOTE — Telephone Encounter (Signed)
She can get new machine if her current machine is more than 76 years old or not working.  Otherwise she needs to continue to use her current machine.

## 2013-11-11 NOTE — Telephone Encounter (Signed)
lmtcb x1 

## 2013-11-13 NOTE — Telephone Encounter (Signed)
lmomtcb x 2  

## 2013-11-14 NOTE — Telephone Encounter (Signed)
Pt is aware. Nothing further needed 

## 2014-02-14 ENCOUNTER — Ambulatory Visit: Payer: Self-pay | Admitting: Gastroenterology

## 2014-02-17 LAB — PATHOLOGY REPORT

## 2014-03-13 ENCOUNTER — Encounter: Payer: Self-pay | Admitting: Cardiovascular Disease

## 2014-03-13 ENCOUNTER — Ambulatory Visit (INDEPENDENT_AMBULATORY_CARE_PROVIDER_SITE_OTHER): Payer: Medicare Other | Admitting: Cardiovascular Disease

## 2014-03-13 VITALS — BP 146/78 | HR 72 | Ht 62.0 in | Wt 260.2 lb

## 2014-03-13 DIAGNOSIS — I25810 Atherosclerosis of coronary artery bypass graft(s) without angina pectoris: Secondary | ICD-10-CM

## 2014-03-13 DIAGNOSIS — I25708 Atherosclerosis of coronary artery bypass graft(s), unspecified, with other forms of angina pectoris: Secondary | ICD-10-CM

## 2014-03-13 DIAGNOSIS — I209 Angina pectoris, unspecified: Secondary | ICD-10-CM

## 2014-03-13 DIAGNOSIS — I5032 Chronic diastolic (congestive) heart failure: Secondary | ICD-10-CM

## 2014-03-13 DIAGNOSIS — I10 Essential (primary) hypertension: Secondary | ICD-10-CM

## 2014-03-13 DIAGNOSIS — E785 Hyperlipidemia, unspecified: Secondary | ICD-10-CM

## 2014-03-13 NOTE — Assessment & Plan Note (Signed)
Continue treatment with high-dose Crestor. Most recent lipid profile was optimal with LDL less than 70.

## 2014-03-13 NOTE — Patient Instructions (Signed)
Continue same medications.   Your physician wants you to follow-up in: 6 months.  You will receive a reminder letter in the mail two months in advance. If you don't receive a letter, please call our office to schedule the follow-up appointment.  

## 2014-03-13 NOTE — Assessment & Plan Note (Signed)
She seems to be stable overall from a cardiac standpoint. I suspect that her shortness of breath is likely multifactorial due to chronic diastolic heart failure, obesity and physical deconditioning. I recommend continuing medical therapy.

## 2014-03-13 NOTE — Assessment & Plan Note (Signed)
She appears to be euvolemic on current dose of Lasix. Blood pressure is reasonably controlled.

## 2014-03-13 NOTE — Assessment & Plan Note (Signed)
Blood pressure is reasonably controlled on current medications. 

## 2014-03-13 NOTE — Progress Notes (Signed)
HPI  This is a 78 year old with history of coronary artery disease status post CABG, resistant hypertension, renal artery stenosis, morbid obesity, diastolic congestive heart failure, CKD, and OHS/OSA who presents to Cardiology Clinic for followup.  In 2/12, she was hospitalized at [facility] with overdiuresis and acute renal failure after being put on daily metolozone. She is using home oxygen with exertion for OHS and CPAP at night for OSA.  She has been doing reasonably well since her last visit.  Most recent creatinine was 2.3. She follows with Dr. Holley Raring. She is known to have occluded right renal artery with mild left renal artery stenosis.  She had worsening dyspnea during last visit.  Echocardiogram in January of 2015 showed normal LV systolic function, grade 1 diastolic dysfunction and aortic sclerosis without significant stenosis. Pharmacologic nuclear stress test also in January of 2015 showed possible mild ischemia in the distal anterior wall versus shifting breast attenuation. Ejection fraction was normal. I recommended continuing medical therapy.  Allergies  Allergen Reactions  . Ace Inhibitors     unknown  . Amlodipine Besylate     "something happened to gums"  . Clonidine Hydrochloride   . Codeine Hives  . Metformin     REACTION: vomiting     Current Outpatient Prescriptions on File Prior to Visit  Medication Sig Dispense Refill  . 5-Hydroxytryptophan (5-HTP PO) Take by mouth. As directed        . albuterol (VENTOLIN HFA) 108 (90 BASE) MCG/ACT inhaler Inhale 2 puffs into the lungs every 6 (six) hours as needed for wheezing.  1 Inhaler  5  . aspirin 81 MG EC tablet Take 81 mg by mouth daily.        . carvedilol (COREG) 25 MG tablet TAKE ONE TABLET BY MOUTH TWICE DAILY WITH MEALS  180 tablet  3  . Cholecalciferol (D 5000 PO) Take by mouth as directed.        . co-enzyme Q-10 30 MG capsule 2 capsules once daily      . ferrous fumarate-iron polysaccharide complex (TANDEM)  162-115.2 MG CAPS Take 1 capsule by mouth daily with breakfast.        . ferrous gluconate (FERGON) 325 MG tablet Take 325 mg by mouth daily with breakfast.        . FLUoxetine (PROZAC) 10 MG tablet Take 10 mg by mouth daily.      . furosemide (LASIX) 40 MG tablet Take 40 mg by mouth as directed.      [name] HUMALOG KWIKPEN 100 UNIT/ML SOPN Inject 10 Units into the skin 2 (two) times daily before a meal.      . hydrALAZINE (APRESOLINE) 25 MG tablet Take 25 mg by mouth 3 (three) times daily.        . insulin glargine (LANTUS) 100 UNIT/ML injection Inject 38 Units into the skin at bedtime.       [name] levothyroxine (SYNTHROID, LEVOTHROID) 75 MCG tablet Take 75 mcg by mouth daily before breakfast.      . MICARDIS 80 MG tablet TAKE ONE TABLET BY MOUTH EVERY DAY  30 each  5  . rosuvastatin (CRESTOR) 40 MG tablet Take 40 mg by mouth daily.        . traZODone (DESYREL) 50 MG tablet Take 50 mg by mouth daily as needed.        . triamcinolone (KENALOG) 0.5 % cream as needed.       No current facility-administered medications on file prior to visit.  Past Medical History  Diagnosis Date  . Hyperlipidemia   . Hypertension     resistant hyptertension times many years. the patient does have renal artery stenosis. she has tried calcium channel blockers in the past and states that she would not take them now because she had some problems with her gums which her dentist identified as calcium-channel blocker side effects.  . Diabetes mellitus     type 2  . Coronary artery disease     s/p anterior MI in 1996 followed by CABG. Lexiscan myoview (4/11): EF 67%, normal perfusion with no evidence for ischemia or infarction.   . Renal artery stenosis     The patient has an occluded right renal artery and an atrophic right kidney. There is 20% left renal artery stenosis. This was seen by catheterization in 2007  . Diastolic heart failure     most recent echo (12/10) showed EF 55-60% with mild LVH, grade I diastolic  dysfunction, mild LAE.  [name] Aortic valve disorder     Echo in 2009 showed mean aortic valve gradient of 13 mmHg, suggesting very mild stenosis. Echo (12/10) suggested aortic sclerosis only  . Obesity   . Chronic kidney disease     most recent creatinine was 1.2  . Obstructive sleep apnea     PSG 01/05/2006 AHI 24.8, CPAP 9cm H2O  . Mild asthma     PFT 02/05/10 FEV1 1.38, FEV1% 73, TLC 3.78 (86%), DLCO 48%, +BD  . ACE-inhibitor cough   . Hypothyroidism   . Obesity hypoventilation syndrome        . Pulmonary nodule, right   . Carotid artery disease     mild, carotid dopplers 12/2009     Past Surgical History  Procedure Laterality Date  . Tubal ligation  1973  . Inner ear surgery  1979    right  . Abdominal hysterectomy  1985  . Coronary artery bypass graft  1996  . Cataract extraction  2010     Family History  Problem Relation Age of Onset  . Kidney failure Mother   . Diabetes Mother   . Aortic aneurysm Father   . Coronary artery disease Father   . Heart attack Father   . Diabetes Sister   . Diabetes Brother   . Kidney failure Brother      History   Social History  . Marital Status: Married    Spouse Name: N/A    Number of Children: N/A  . Years of Education: N/A   Occupational History  . Not on file.   Social History Main Topics  . Smoking status: Never Smoker   . Smokeless tobacco: Never Used  . Alcohol Use: No  . Drug Use: No  . Sexual Activity: Not on file   Other Topics Concern  . Not on file   Social History Narrative  . No narrative on file      PHYSICAL EXAM   BP 146/78  Pulse 72  Ht 5\' 2"  (1.575 m)  Wt 260 lb 4 oz (118.049 kg)  BMI 47.59 kg/m2 Constitutional: She is oriented to person, place, and time. She appears well-developed and well-nourished. No distress.  HENT: No nasal discharge.  Head: Normocephalic and atraumatic.  Eyes: Pupils are equal and round. Right eye exhibits no discharge. Left eye exhibits no discharge.  Neck:  Normal range of motion. Neck supple. No JVD present. No thyromegaly present.  Cardiovascular: Normal rate, regular rhythm, normal heart sounds. Exam reveals no gallop and  no friction rub. There is a 2/6 systolic ejection murmur at the aortic area which is early peaking with preserved S2.  Pulmonary/Chest: Effort normal and breath sounds normal. No stridor. No respiratory distress. She has no wheezes. She has no rales. She exhibits no tenderness.  Abdominal: Soft. Bowel sounds are normal. She exhibits no distension. There is no tenderness. There is no rebound and no guarding.  Musculoskeletal: Normal range of motion. She exhibits +1 edema and no tenderness.  Neurological: She is alert and oriented to person, place, and time. Coordination normal.  Skin: Skin is warm and dry. No rash noted. She is not diaphoretic. No erythema. No pallor.  Psychiatric: She has a normal mood and affect. Her behavior is normal. Judgment and thought content normal.     EKG: Sinus  Rhythm  Low voltage in precordial leads.   -  Nonspecific T-abnormality.   ABNORMAL    ASSESSMENT AND PLAN

## 2014-03-14 ENCOUNTER — Encounter: Payer: Self-pay | Admitting: Pulmonary Disease

## 2014-03-14 ENCOUNTER — Ambulatory Visit (INDEPENDENT_AMBULATORY_CARE_PROVIDER_SITE_OTHER): Payer: Medicare Other | Admitting: Pulmonary Disease

## 2014-03-14 VITALS — BP 110/88 | HR 89 | Temp 97.4°F | Ht 62.0 in | Wt 251.0 lb

## 2014-03-14 DIAGNOSIS — G4733 Obstructive sleep apnea (adult) (pediatric): Secondary | ICD-10-CM

## 2014-03-14 DIAGNOSIS — E662 Morbid (severe) obesity with alveolar hypoventilation: Secondary | ICD-10-CM

## 2014-03-14 DIAGNOSIS — J4520 Mild intermittent asthma, uncomplicated: Secondary | ICD-10-CM

## 2014-03-14 DIAGNOSIS — Z9989 Dependence on other enabling machines and devices: Principal | ICD-10-CM

## 2014-03-14 DIAGNOSIS — I25810 Atherosclerosis of coronary artery bypass graft(s) without angina pectoris: Secondary | ICD-10-CM

## 2014-03-14 DIAGNOSIS — J45909 Unspecified asthma, uncomplicated: Secondary | ICD-10-CM

## 2014-03-14 NOTE — Progress Notes (Signed)
Chief Complaint  Patient presents with  . Follow-up    Wears CPAP nightly. Denies problems with mask or pressure. Needs Rx for new machine/supplies. Pt reports no chang ein breathing since last OV.     History of Present Illness: Dawn Bradford is a 78 y.o. female with multifactorial dyspnea, mild/intermittent asthma, OSA/OHS, deconditioning, and diastolic dysfunction.  Her breathing has been stable.  She has not needed to use albuterol much.  She uses oxygen 24/7.  She is not having any trouble with her CPAP set up.  She tries to avoid going out in the heat.  She keeps up with household activities, but has to stop and rest frequently.  Tests: PSG 01/05/06>>AHI 24.8  Echo 08/13/08>>EF 60%, mod LVH, mild AS, mild LA dilation, mild TR V/Q scan 01/26/10>>very low probability for PE PFT 02/05/10>>FEV1 1.38(77%), FEV1% 73, TLC 3.78(86%), DLCO 48%, +BD CT chest 05/31/11>>5 mm RUL nodule stable for 2 yrs Auto CPAP 08/08/11 to 08/22/11>>Used on 15 of 15 nights with average 10 hrs 6 min. Average AHI 3.8 with mean CPAP 8 cm H2O and 95th percentile CPAP 11 cm H2O. Auto CPAP 10/25/13 to 11/05/13 >> Used on 12 of 12 nights with average 10 hrs 46 min. Average AHI 3.3 with mean CPAP 9 cm H2O and 90 th percentile CPAP 12 cm H2O.  She  has a past medical history of Hyperlipidemia; Hypertension; Diabetes mellitus; Coronary artery disease; Renal artery stenosis; Diastolic heart failure; Aortic valve disorder; Obesity; Chronic kidney disease; Obstructive sleep apnea; Mild asthma; ACE-inhibitor cough; Hypothyroidism; Obesity hypoventilation syndrome; Pulmonary nodule, right; and Carotid artery disease.  She  has past surgical history that includes Tubal ligation (1973); Inner ear surgery (1979); Abdominal hysterectomy (1985); Coronary artery bypass graft (1996); and Cataract extraction (2010).   Current Outpatient Prescriptions on File Prior to Visit  Medication Sig Dispense Refill  . 5-Hydroxytryptophan (5-HTP  PO) Take by mouth. As directed        . albuterol (VENTOLIN HFA) 108 (90 BASE) MCG/ACT inhaler Inhale 2 puffs into the lungs every 6 (six) hours as needed for wheezing.  1 Inhaler  5  . aspirin 81 MG EC tablet Take 81 mg by mouth daily.        . carvedilol (COREG) 25 MG tablet TAKE ONE TABLET BY MOUTH TWICE DAILY WITH MEALS  180 tablet  3  . Cholecalciferol (D 5000 PO) Take by mouth as directed.        . co-enzyme Q-10 30 MG capsule 2 capsules once daily      . ferrous fumarate-iron polysaccharide complex (TANDEM) 162-115.2 MG CAPS Take 1 capsule by mouth daily with breakfast.        . ferrous gluconate (FERGON) 325 MG tablet Take 325 mg by mouth daily with breakfast.        . FLUoxetine (PROZAC) 10 MG tablet Take 10 mg by mouth daily.      . furosemide (LASIX) 40 MG tablet Take 40 mg by mouth as directed.      [name] HUMALOG KWIKPEN 100 UNIT/ML SOPN Inject 10 Units into the skin 2 (two) times daily before a meal.      . hydrALAZINE (APRESOLINE) 25 MG tablet Take 25 mg by mouth 3 (three) times daily.        . insulin glargine (LANTUS) 100 UNIT/ML injection Inject 38 Units into the skin at bedtime.       [name] levothyroxine (SYNTHROID, LEVOTHROID) 75 MCG tablet Take 75 mcg by mouth daily before  breakfast.      . MICARDIS 80 MG tablet TAKE ONE TABLET BY MOUTH EVERY DAY  30 each  5  . rosuvastatin (CRESTOR) 40 MG tablet Take 40 mg by mouth daily.        . traZODone (DESYREL) 50 MG tablet Take 50 mg by mouth daily as needed.        . triamcinolone (KENALOG) 0.5 % cream as needed.       No current facility-administered medications on file prior to visit.    Allergies  Allergen Reactions  . Ace Inhibitors     unknown  . Amlodipine Besylate     "something happened to gums"  . Clonidine Hydrochloride   . Codeine Hives  . Metformin     REACTION: vomiting    Physical Exam:  General - No distress, wearing oxygen ENT - No sinus tenderness, no oral exudate, no LAN Cardiac - s1s2 regular, 2/6  SM Chest - No wheeze/rales/dullness, good air entry, normal respiratory excursion Back - No focal tenderness Abd - Soft, non-tender Ext - no edema Neuro - Normal strength Skin - No rashes Psych - Normal mood, and behavior  Assessment/Plan:  Chesley Mires, MD [facility]/Sleep Pager:  [phone] 03/14/2014, 12:19 PM

## 2014-03-14 NOTE — Assessment & Plan Note (Signed)
She is to continue 2 liters oxygen 24/7. 

## 2014-03-14 NOTE — Patient Instructions (Signed)
Will arrange for new CPAP mask Follow up in 1 year

## 2014-03-14 NOTE — Assessment & Plan Note (Signed)
She is doing well with CPAP, and reports compliance with therapy.

## 2014-03-14 NOTE — Assessment & Plan Note (Signed)
Continue prn albuterol

## 2014-07-03 ENCOUNTER — Ambulatory Visit: Payer: Self-pay | Admitting: Vascular Surgery

## 2014-07-03 DIAGNOSIS — E119 Type 2 diabetes mellitus without complications: Secondary | ICD-10-CM

## 2014-07-03 DIAGNOSIS — I10 Essential (primary) hypertension: Secondary | ICD-10-CM

## 2014-07-03 LAB — BASIC METABOLIC PANEL
Anion Gap: 11 (ref 7–16)
BUN: 45 mg/dL — ABNORMAL HIGH (ref 7–18)
CALCIUM: 9.4 mg/dL (ref 8.5–10.1)
CREATININE: 3.06 mg/dL — AB (ref 0.60–1.30)
Chloride: 95 mmol/L — ABNORMAL LOW (ref 98–107)
Co2: 30 mmol/L (ref 21–32)
EGFR (African American): 19 — ABNORMAL LOW
EGFR (Non-African Amer.): 16 — ABNORMAL LOW
Glucose: 228 mg/dL — ABNORMAL HIGH (ref 65–99)
Osmolality: 291 (ref 275–301)
Potassium: 4.3 mmol/L (ref 3.5–5.1)
SODIUM: 136 mmol/L (ref 136–145)

## 2014-07-03 LAB — CBC
HCT: 36.6 % (ref 35.0–47.0)
HGB: 12.2 g/dL (ref 12.0–16.0)
MCH: 32.6 pg (ref 26.0–34.0)
MCHC: 33.4 g/dL (ref 32.0–36.0)
MCV: 98 fL (ref 80–100)
Platelet: 156 10*3/uL (ref 150–440)
RBC: 3.74 10*6/uL — ABNORMAL LOW (ref 3.80–5.20)
RDW: 14.3 % (ref 11.5–14.5)
WBC: 9.4 10*3/uL (ref 3.6–11.0)

## 2014-07-08 ENCOUNTER — Telehealth: Payer: Self-pay | Admitting: Pulmonary Disease

## 2014-07-08 NOTE — Telephone Encounter (Signed)
Called spoke with pt. She reports she had ABG done through [facility] on Friday. She wants to know if results were received yet? Please advise thanks

## 2014-07-10 ENCOUNTER — Telehealth: Payer: Self-pay | Admitting: Pulmonary Disease

## 2014-07-10 NOTE — Telephone Encounter (Addendum)
(  message closed in error)  Called and spoke to [place]. Baker Janus stated the pt is scheduled for surgery on 07/11/14 for insertion of left wrist fistula for dialysis. Pt was denied clearance by PCP d/t the pt primarily needing pulmonary clearance. Pt last seen by [facility] on 03/14/14.   Dr. Halford Chessman have you received this clearance form? Are you willing to clear her or do we need to schedule OV?

## 2014-07-10 NOTE — Telephone Encounter (Signed)
She can proceed with surgery so long as it is done in hospital setting.  She will need to have close monitoring of her oxygen during peri-operative period and will need to use CPAP in the post-operative period.

## 2014-07-10 NOTE — Telephone Encounter (Signed)
Called made [facility] aware of below. She needs this faxed to her at [phone]. I have done so. Nothing further needed

## 2014-07-11 ENCOUNTER — Ambulatory Visit: Payer: Self-pay | Admitting: Vascular Surgery

## 2014-07-11 NOTE — Telephone Encounter (Signed)
ABG 07/03/14 >> pH 7.42, PaCO2 44, PaO2 56.  She has low oxygen level on ABG.  Otherwise normal.  Her low oxygen level is know with her hx of OSA and hypoventilation.  She should continue with 2 liters supplemental oxygen 24/7.

## 2014-07-11 NOTE — Telephone Encounter (Signed)
I have not seen this

## 2014-07-11 NOTE — Telephone Encounter (Signed)
Called [facility] [phone] Spoke with lab tech, lab results faxed.  These have been placed in Dr Halford Chessman look at folder to review.

## 2014-07-11 NOTE — Telephone Encounter (Signed)
I spoke with patient about results and she verbalized understanding and had no questions 

## 2014-07-11 NOTE — Telephone Encounter (Signed)
Dr Halford Chessman please advise if you have seen these results which should have been received via fax. Thanks.

## 2014-09-12 ENCOUNTER — Inpatient Hospital Stay: Payer: Self-pay | Admitting: Internal Medicine

## 2014-09-12 LAB — TROPONIN I
Troponin-I: <0.02 ng/mL
Troponin-I: <0.02 ng/mL

## 2014-09-12 LAB — CBC WITH DIFFERENTIAL/PLATELET
BASOS ABS: 0 10*3/uL (ref 0.0–0.1)
Basophil %: 0.4 %
EOS PCT: 5 %
Eosinophil #: 0.4 10*3/uL (ref 0.0–0.7)
HCT: 34.1 % — AB (ref 35.0–47.0)
HGB: 11.2 g/dL — AB (ref 12.0–16.0)
LYMPHS ABS: 1.6 10*3/uL (ref 1.0–3.6)
Lymphocyte %: 18 %
MCH: 32.7 pg (ref 26.0–34.0)
MCHC: 32.8 g/dL (ref 32.0–36.0)
MCV: 100 fL (ref 80–100)
MONO ABS: 0.6 x10 3/mm (ref 0.2–0.9)
Monocyte %: 7.1 %
NEUTROS PCT: 69.5 %
Neutrophil #: 6.2 10*3/uL (ref 1.4–6.5)
PLATELETS: 133 10*3/uL — AB (ref 150–440)
RBC: 3.42 10*6/uL — ABNORMAL LOW (ref 3.80–5.20)
RDW: 13.6 % (ref 11.5–14.5)
WBC: 8.9 10*3/uL (ref 3.6–11.0)

## 2014-09-12 LAB — RENAL FUNCTION PANEL
Albumin: 3 g/dL — ABNORMAL LOW (ref 3.4–5.0)
Anion Gap: 8 (ref 7–16)
BUN: 50 mg/dL — ABNORMAL HIGH (ref 7–18)
CO2: 28 mmol/L (ref 21–32)
Calcium, Total: 8.6 mg/dL (ref 8.5–10.1)
Chloride: 102 mmol/L (ref 98–107)
Creatinine: 3.87 mg/dL — ABNORMAL HIGH (ref 0.60–1.30)
EGFR (African American): 15 — ABNORMAL LOW
GFR CALC NON AF AMER: 12 — AB
GLUCOSE: 127 mg/dL — AB (ref 65–99)
OSMOLALITY: 291 (ref 275–301)
PHOSPHORUS: 3.8 mg/dL (ref 2.5–4.9)
Potassium: 3.7 mmol/L (ref 3.5–5.1)
Sodium: 138 mmol/L (ref 136–145)

## 2014-09-12 LAB — COMPREHENSIVE METABOLIC PANEL
ALBUMIN: 3.1 g/dL — AB (ref 3.4–5.0)
ALK PHOS: 81 U/L
ANION GAP: 7 (ref 7–16)
AST: 24 U/L (ref 15–37)
BUN: 51 mg/dL — ABNORMAL HIGH (ref 7–18)
Bilirubin,Total: 0.3 mg/dL (ref 0.2–1.0)
CALCIUM: 8.4 mg/dL — AB (ref 8.5–10.1)
Chloride: 101 mmol/L (ref 98–107)
Co2: 28 mmol/L (ref 21–32)
Creatinine: 3.81 mg/dL — ABNORMAL HIGH (ref 0.60–1.30)
EGFR (African American): 15 — ABNORMAL LOW
GFR CALC NON AF AMER: 12 — AB
Glucose: 125 mg/dL — ABNORMAL HIGH (ref 65–99)
Osmolality: 287 (ref 275–301)
Potassium: 3.7 mmol/L (ref 3.5–5.1)
SGPT (ALT): 20 U/L
Sodium: 136 mmol/L (ref 136–145)
Total Protein: 6.5 g/dL (ref 6.4–8.2)

## 2014-09-12 LAB — CK TOTAL AND CKMB (NOT AT ARMC)
CK, TOTAL: 90 U/L (ref 26–192)
CK, TOTAL: 93 U/L (ref 26–192)
CK, Total: 120 U/L (ref 26–192)
CK-MB: 1.1 ng/mL (ref 0.5–3.6)
CK-MB: 1.1 ng/mL (ref 0.5–3.6)
CK-MB: 0.8 ng/mL (ref 0.5–3.6)

## 2014-09-12 LAB — PRO B NATRIURETIC PEPTIDE: B-TYPE NATIURETIC PEPTID: 801 pg/mL — AB (ref 0–450)

## 2014-09-12 LAB — LIPID PANEL
Cholesterol: 105 mg/dL (ref 0–200)
HDL: 44 mg/dL (ref 40–60)
Ldl Cholesterol, Calc: 42 mg/dL (ref 0–100)
TRIGLYCERIDES: 95 mg/dL (ref 0–200)
VLDL Cholesterol, Calc: 19 mg/dL (ref 5–40)

## 2014-09-12 LAB — HEMOGLOBIN A1C: Hemoglobin A1C: 7.2 % — ABNORMAL HIGH (ref 4.2–6.3)

## 2014-09-12 LAB — IRON AND TIBC
IRON BIND. CAP.(TOTAL): 237 ug/dL — AB (ref 250–450)
Iron Saturation: 22 %
Iron: 52 ug/dL (ref 50–170)
Unbound Iron-Bind.Cap.: 185 ug/dL

## 2014-09-12 LAB — FERRITIN: FERRITIN (ARMC): 213 ng/mL (ref 8–388)

## 2014-09-12 LAB — PROTIME-INR
INR: 1.2
PROTHROMBIN TIME: 15 s — AB (ref 11.5–14.7)

## 2014-09-12 LAB — TSH: Thyroid Stimulating Horm: 1.63 u[IU]/mL

## 2014-09-13 LAB — RENAL FUNCTION PANEL
Albumin: 2.9 g/dL — ABNORMAL LOW (ref 3.4–5.0)
Anion Gap: 9 (ref 7–16)
BUN: 37 mg/dL — ABNORMAL HIGH (ref 7–18)
CHLORIDE: 98 mmol/L (ref 98–107)
CREATININE: 3.56 mg/dL — AB (ref 0.60–1.30)
Calcium, Total: 8.6 mg/dL (ref 8.5–10.1)
Co2: 30 mmol/L (ref 21–32)
GFR CALC AF AMER: 16 — AB
GFR CALC NON AF AMER: 13 — AB
Glucose: 222 mg/dL — ABNORMAL HIGH (ref 65–99)
OSMOLALITY: 289 (ref 275–301)
POTASSIUM: 3.3 mmol/L — AB (ref 3.5–5.1)
Phosphorus: 3.1 mg/dL (ref 2.5–4.9)
SODIUM: 137 mmol/L (ref 136–145)

## 2014-09-13 LAB — CBC WITH DIFFERENTIAL/PLATELET
BASOS ABS: 0 10*3/uL (ref 0.0–0.1)
Basophil %: 0.3 %
EOS ABS: 0.3 10*3/uL (ref 0.0–0.7)
Eosinophil %: 4.5 %
HCT: 32.8 % — AB (ref 35.0–47.0)
HGB: 10.8 g/dL — ABNORMAL LOW (ref 12.0–16.0)
LYMPHS ABS: 1.2 10*3/uL (ref 1.0–3.6)
Lymphocyte %: 15.6 %
MCH: 32.9 pg (ref 26.0–34.0)
MCHC: 33 g/dL (ref 32.0–36.0)
MCV: 100 fL (ref 80–100)
Monocyte #: 0.5 x10 3/mm (ref 0.2–0.9)
Monocyte %: 6.6 %
NEUTROS ABS: 5.5 10*3/uL (ref 1.4–6.5)
Neutrophil %: 73 %
Platelet: 113 10*3/uL — ABNORMAL LOW (ref 150–440)
RBC: 3.3 10*6/uL — ABNORMAL LOW (ref 3.80–5.20)
RDW: 13.9 % (ref 11.5–14.5)
WBC: 7.5 10*3/uL (ref 3.6–11.0)

## 2014-09-15 LAB — CBC WITH DIFFERENTIAL/PLATELET
Basophil #: 0 10*3/uL (ref 0.0–0.1)
Basophil %: 0.6 %
EOS ABS: 0.5 10*3/uL (ref 0.0–0.7)
Eosinophil %: 6.4 %
HCT: 34.8 % — AB (ref 35.0–47.0)
HGB: 11.4 g/dL — ABNORMAL LOW (ref 12.0–16.0)
LYMPHS ABS: 1.5 10*3/uL (ref 1.0–3.6)
Lymphocyte %: 19.1 %
MCH: 32.8 pg (ref 26.0–34.0)
MCHC: 32.9 g/dL (ref 32.0–36.0)
MCV: 100 fL (ref 80–100)
MONOS PCT: 9.3 %
Monocyte #: 0.7 x10 3/mm (ref 0.2–0.9)
NEUTROS ABS: 4.9 10*3/uL (ref 1.4–6.5)
Neutrophil %: 64.6 %
Platelet: 115 10*3/uL — ABNORMAL LOW (ref 150–440)
RBC: 3.49 10*6/uL — ABNORMAL LOW (ref 3.80–5.20)
RDW: 13.9 % (ref 11.5–14.5)
WBC: 7.6 10*3/uL (ref 3.6–11.0)

## 2014-09-15 LAB — RENAL FUNCTION PANEL
Albumin: 2.9 g/dL — ABNORMAL LOW (ref 3.4–5.0)
Anion Gap: 8 (ref 7–16)
BUN: 38 mg/dL — ABNORMAL HIGH (ref 7–18)
CALCIUM: 9 mg/dL (ref 8.5–10.1)
CHLORIDE: 97 mmol/L — AB (ref 98–107)
Co2: 32 mmol/L (ref 21–32)
Creatinine: 4.03 mg/dL — ABNORMAL HIGH (ref 0.60–1.30)
EGFR (Non-African Amer.): 11 — ABNORMAL LOW
GFR CALC AF AMER: 14 — AB
Glucose: 112 mg/dL — ABNORMAL HIGH (ref 65–99)
Osmolality: 284 (ref 275–301)
PHOSPHORUS: 3.5 mg/dL (ref 2.5–4.9)
Potassium: 3.7 mmol/L (ref 3.5–5.1)
Sodium: 137 mmol/L (ref 136–145)

## 2014-10-09 ENCOUNTER — Ambulatory Visit: Payer: Self-pay | Admitting: Vascular Surgery

## 2014-11-06 ENCOUNTER — Encounter: Payer: Self-pay | Admitting: Cardiovascular Disease

## 2014-11-06 ENCOUNTER — Ambulatory Visit (INDEPENDENT_AMBULATORY_CARE_PROVIDER_SITE_OTHER): Payer: Medicare Other | Admitting: Cardiovascular Disease

## 2014-11-06 VITALS — BP 128/60 | HR 74 | Ht 62.5 in | Wt 243.5 lb

## 2014-11-06 DIAGNOSIS — E785 Hyperlipidemia, unspecified: Secondary | ICD-10-CM

## 2014-11-06 DIAGNOSIS — I25708 Atherosclerosis of coronary artery bypass graft(s), unspecified, with other forms of angina pectoris: Secondary | ICD-10-CM

## 2014-11-06 DIAGNOSIS — R0602 Shortness of breath: Secondary | ICD-10-CM

## 2014-11-06 DIAGNOSIS — I359 Nonrheumatic aortic valve disorder, unspecified: Secondary | ICD-10-CM

## 2014-11-06 DIAGNOSIS — I5032 Chronic diastolic (congestive) heart failure: Secondary | ICD-10-CM

## 2014-11-06 DIAGNOSIS — I10 Essential (primary) hypertension: Secondary | ICD-10-CM

## 2014-11-06 NOTE — Patient Instructions (Signed)
Continue same medications.   Your physician wants you to follow-up in: 6 months.  You will receive a reminder letter in the mail two months in advance. If you don't receive a letter, please call our office to schedule the follow-up appointment.  

## 2014-11-06 NOTE — Progress Notes (Signed)
HPI  This is a 79 year old with history of coronary artery disease status post CABG, resistant hypertension, renal artery stenosis, morbid obesity, diastolic congestive heart failure, end stage renal disease on hemodialysis, and OHS/OSA who presents to Cardiology Clinic for followup. Echocardiogram in January of 2015 showed normal LV systolic function, grade 1 diastolic dysfunction and aortic sclerosis without significant stenosis. Pharmacologic nuclear stress test also in January of 2015 showed possible mild ischemia in the distal anterior wall versus shifting breast attenuation. Ejection fraction was normal. I recommended continuing medical therapy. Dialysis was started in November due to worsening kidney function. She reports improved symptoms overall. She denies chest pain.  Allergies  Allergen Reactions  . Ace Inhibitors     unknown  . Amlodipine Besylate     "something happened to gums"  . Clonidine Hydrochloride   . Codeine Hives  . Metformin     REACTION: vomiting     Current Outpatient Prescriptions on File Prior to Visit  Medication Sig Dispense Refill  . 5-Hydroxytryptophan (5-HTP PO) Take by mouth. As directed      . aspirin 81 MG EC tablet Take 81 mg by mouth daily.      . carvedilol (COREG) 25 MG tablet TAKE ONE TABLET BY MOUTH TWICE DAILY WITH MEALS 180 tablet 3  . Cholecalciferol (D 5000 PO) Take by mouth as directed.      [name] FLUoxetine (PROZAC) 10 MG tablet Take 10 mg by mouth daily.    . furosemide (LASIX) 40 MG tablet Take 40 mg by mouth. Takes 1 tablet on Tuesday, Thursday, Saturday and Sundays.    [name] HUMALOG KWIKPEN 100 UNIT/ML SOPN Inject 10 Units into the skin 2 (two) times daily before a meal.    . hydrALAZINE (APRESOLINE) 25 MG tablet Take 25 mg by mouth 3 (three) times daily.      . insulin glargine (LANTUS) 100 UNIT/ML injection Inject 38 Units into the skin at bedtime.     [name] levothyroxine (SYNTHROID, LEVOTHROID) 75 MCG tablet Take 75 mcg by mouth daily  before breakfast.    . MICARDIS 80 MG tablet TAKE ONE TABLET BY MOUTH EVERY DAY 30 each 5  . rosuvastatin (CRESTOR) 40 MG tablet Take 40 mg by mouth daily.      . traZODone (DESYREL) 50 MG tablet Take 50 mg by mouth daily as needed.      . triamcinolone (KENALOG) 0.5 % cream as needed.    [name] albuterol (VENTOLIN HFA) 108 (90 BASE) MCG/ACT inhaler Inhale 2 puffs into the lungs every 6 (six) hours as needed for wheezing. (Patient not taking: Reported on 11/06/2014) 1 Inhaler 5   No current facility-administered medications on file prior to visit.     Past Medical History  Diagnosis Date  . Hyperlipidemia   . Hypertension     resistant hyptertension times many years. the patient does have renal artery stenosis. she has tried calcium channel blockers in the past and states that she would not take them now because she had some problems with her gums which her dentist identified as calcium-channel blocker side effects.  . Diabetes mellitus     type 2  . Coronary artery disease     s/p anterior MI in 1996 followed by CABG. Lexiscan myoview (4/11): EF 67%, normal perfusion with no evidence for ischemia or infarction.   . Renal artery stenosis     The patient has an occluded right renal artery and an atrophic right kidney. There is 20% left  renal artery stenosis. This was seen by catheterization in 2007  . Diastolic heart failure     most recent echo (12/10) showed EF 55-60% with mild LVH, grade I diastolic dysfunction, mild LAE.  [name] Aortic valve disorder     Echo in 2009 showed mean aortic valve gradient of 13 mmHg, suggesting very mild stenosis. Echo (12/10) suggested aortic sclerosis only  . Obesity   . Chronic kidney disease     most recent creatinine was 1.2  . Obstructive sleep apnea     PSG 01/05/2006 AHI 24.8, CPAP 9cm H2O  . Mild asthma     PFT 02/05/10 FEV1 1.38, FEV1% 73, TLC 3.78 (86%), DLCO 48%, +BD  . ACE-inhibitor cough   . Hypothyroidism   . Obesity hypoventilation syndrome          . Pulmonary nodule, right   . Carotid artery disease     mild, carotid dopplers 12/2009     Past Surgical History  Procedure Laterality Date  . Tubal ligation  1973  . Inner ear surgery  1979    right  . Abdominal hysterectomy  1985  . Coronary artery bypass graft  1996  . Cataract extraction  2010  . Av fistula placement    . Dialysis fistula creation       Family History  Problem Relation Age of Onset  . Kidney failure Mother   . Diabetes Mother   . Aortic aneurysm Father   . Coronary artery disease Father   . Heart attack Father   . Diabetes Sister   . Diabetes Brother   . Kidney failure Brother      History   Social History  . Marital Status: Married    Spouse Name: N/A  . Number of Children: N/A  . Years of Education: N/A   Occupational History  . Not on file.   Social History Main Topics  . Smoking status: Never Smoker   . Smokeless tobacco: Never Used  . Alcohol Use: No  . Drug Use: No  . Sexual Activity: Not on file   Other Topics Concern  . Not on file   Social History Narrative      PHYSICAL EXAM   BP 128/60 mmHg  Pulse 74  Ht 5' 2.5" (1.588 m)  Wt 243 lb 8 oz (110.451 kg)  BMI 43.80 kg/m2 Constitutional: She is oriented to person, place, and time. She appears well-developed and well-nourished. No distress.  HENT: No nasal discharge.  Head: Normocephalic and atraumatic.  Eyes: Pupils are equal and round. Right eye exhibits no discharge. Left eye exhibits no discharge.  Neck: Normal range of motion. Neck supple. No JVD present. No thyromegaly present.  Cardiovascular: Normal rate, regular rhythm, normal heart sounds. Exam reveals no gallop and no friction rub. There is a 2/6 systolic ejection murmur at the aortic area which is early peaking with preserved S2.  Pulmonary/Chest: Effort normal and breath sounds normal. No stridor. No respiratory distress. She has no wheezes. She has no rales. She exhibits no tenderness.  Abdominal: Soft.  Bowel sounds are normal. She exhibits no distension. There is no tenderness. There is no rebound and no guarding.  Musculoskeletal: Normal range of motion. She exhibits +1 edema and no tenderness.  Neurological: She is alert and oriented to person, place, and time. Coordination normal.  Skin: Skin is warm and dry. No rash noted. She is not diaphoretic. No erythema. No pallor.  Psychiatric: She has a normal mood and affect. Her  behavior is normal. Judgment and thought content normal.     EKG: Sinus  Rhythm  Low voltage in precordial leads.   ABNORMAL     ASSESSMENT AND PLAN

## 2014-11-07 NOTE — Assessment & Plan Note (Signed)
Blood pressure is well controlled on current medications. If blood pressure drops, hydralazine should be discontinued first.

## 2014-11-07 NOTE — Assessment & Plan Note (Signed)
She is doing well with no symptoms suggestive of angina. Continue medical therapy.

## 2014-11-07 NOTE — Assessment & Plan Note (Signed)
Continue treatment with rosuvastatin with a target LDL of less than 70. 

## 2014-11-07 NOTE — Assessment & Plan Note (Signed)
She appears to be euvolemic. She continues to make some urine and takes furosemide on the days that she does not have dialysis.

## 2014-12-02 ENCOUNTER — Telehealth: Payer: Self-pay | Admitting: Pulmonary Disease

## 2014-12-02 DIAGNOSIS — E662 Morbid (severe) obesity with alveolar hypoventilation: Secondary | ICD-10-CM

## 2014-12-02 NOTE — Telephone Encounter (Signed)
Spoke with the pt  She states due to "issue with affordable care act and medicare" needs new o2 order  She normally gets 6 tanks per wk I will send this to [facility]  Nothing further needed

## 2014-12-27 NOTE — Op Note (Signed)
PATIENT NAME:  Dawn Bradford, Dawn Bradford MR#:  [id number] DATE OF BIRTH:  Nov 20, 1935  DATE OF PROCEDURE:  07/11/2014  PREOPERATIVE DIAGNOSIS: Stage 4 renal insufficiency.   POSTOPERATIVE DIAGNOSIS: Stage 4 renal insufficiency.  PROCEDURE PERFORMED: Creation of a left radiocephalic fistula.   SURGEON: Katha Cabal, MD..   ANESTHESIA: General by LMA.   FLUIDS: Per anesthesia record.   ESTIMATED BLOOD LOSS: Minimal.   SPECIMEN: None.   INDICATIONS: [name] is a 79 year old woman with multiple medical problems, who is now at stage 4 renal insufficiency. By ultrasound mapping, she appears to have a nice vein measuring 3.3 mm at the wrist. The risks and benefits of a radiocephalic fistula were reviewed. All questions were answered. The patient agrees to proceed.   DESCRIPTION OF PROCEDURE: The patient is taken to the operating room and placed in the supine position. After adequate general anesthesia is induced and appropriate invasive monitors are placed, she is positioned supine with her left arm extended palm upward. The left arm is prepped and draped in sterile fashion. Appropriate timeout is called. Marcaine 0.25% is infiltrated in the soft tissues at the level of the wrist and a linear incision is made midway between the radial artery and the cephalic vein. Dissection is carried down and then the cephalic vein is exposed. It appears to be much smaller than it seemed to be on ultrasound. It is skeletonized over a distance of approximately 2 cm and then ligated and divided distally. Garrett coronary dilators used and at the level 3.0 mm dilator, there is a fair bit of resistance; therefore, a 3.5 was not attempted. A 20-gauge angiocatheter was then used to flush the vein and a bulldog clamp is applied. The vein also is quite thin and does not appear to be nearly as nice as the ultrasound had suggested.   The radial artery is then exposed and looped proximally and distally with Silastic vessel loops. The  vessel loops are used to control the artery and arteriotomy is made with an #11 blade, extended with Potts scissors, 6-0 Prolene stay sutures are placed and then an end vein to side radial artery anastomosis is fashioned using interrupted 7-0 Prolene sutures. A total of 11 sutures are used. Flushing maneuvers are performed and flow was established, first to the fistula both proximally and distally and then down to the hand. The vein distends nicely and has a soft thrill. The suture line demonstrates 1 small area of bleeding and this is easily controlled with an interrupted 7-0 Prolene. The wound is then irrigated and closed in layers using interrupted 3-0 Vicryl for the subcutaneous tissues followed by 4-0 Monocryl subcuticular. The patient tolerated the procedure well. There were no immediate complications. Sponge and needle counts were correct, and she is taken to the recovery area in excellent condition.     ____________________________ Katha Cabal, MD ggs:TT D: 07/11/2014 17:07:23 ET T: 07/11/2014 19:15:29 ET JOB#: [id number]  cc: Katha Cabal, MD, <Dictator> Kerin Perna, MD Dr. Tammi Klippel MD ELECTRONICALLY SIGNED 07/16/2014 7:59

## 2014-12-29 ENCOUNTER — Other Ambulatory Visit: Payer: Self-pay | Admitting: Vascular Surgery

## 2014-12-29 DIAGNOSIS — N186 End stage renal disease: Secondary | ICD-10-CM

## 2015-01-04 NOTE — Op Note (Signed)
PATIENT NAME:  Bradford, Dawn B MR#:  [id number] DATE OF BIRTH:  1936-02-09  DATE OF PROCEDURE:  10/09/2014  PREOPERATIVE DIAGNOSES:  1.  End-stage renal disease.  2.  Poorly maturing left arm arteriovenous fistula after previous intervention.   POSTOPERATIVE DIAGNOSES: 1.  End-stage renal disease.  2.  Poorly maturing left arm arteriovenous fistula after previous intervention.  PROCEDURES PERFORMED: 1.  Ultrasound guidance for vascular access to left radial artery distal to the fistula.  2.  Left upper extremity fistulogram and central venogram.  3.  Left upper extremity angiogram.  4.  Coil embolization of the cephalic vein branch mid forearm with 3 coils.  5.  Percutaneous transluminal angioplasty of the anastomosis with 5 and 6 mm diameter regular drug-coated angioplasty balloon.  6.  Percutaneous transluminal angioplasty of mid forearm cephalic vein with 8 mm diameter angioplasty balloon.  7.  Viabahn covered stent placement x 2 with 8 mm diameter Viabahn stent with extravasation and near occlusion after angioplasty.   SURGEON: Algernon Huxley, MD   ANESTHESIA: Local with moderate conscious sedation.   ESTIMATED BLOOD LOSS: 25 mL.   INDICATION FOR PROCEDURE: A 79 year old female with end-stage renal disease. Her left radiocephalic AV fistula is not maturing. She has had a previous access intervention about a month ago; nonetheless her fistula has not matured for use. She is brought in for a fistulogram as well as the right upper extremity angiogram with access from a distal radial artery approach for evaluation for lack of maturation. Risks and benefits were discussed. Informed consent was obtained.   DESCRIPTION OF PROCEDURE: The patient is brought to the vascular suite. The left upper extremity was sterilely prepped and draped and a sterile surgical field was created. The radial artery distal to the anastomosis several centimeters was accessed under direct ultrasound guidance with a  micropuncture needle and a permanent image was recorded. A micropuncture wire and sheath were then placed, and I upsized to a 6 [place] sheath. Imaging was performed initially with the sheath tip in the cephalic vein just beyond the anastomosis. This showed stenosis of the anastomosis, a large cephalic vein branch about 8-10 cm downstream, and some narrowing of the cephalic vein at this location. There was then dual outflow through the upper arm, mostly through the basilic vein and the central venous circulation was patent. At this point, I treated the anastomotic stenosis with a 5 and then a 6 mm diameter Lutonix drug-coated angioplasty balloon with the balloon going from the cephalic vein just beyond the anastomosis back into the anastomosis in the radial artery. Imaging of the radial artery was performed with the balloon inflation in the cephalic vein to force blood up the radial artery. This demonstrated a normal radial artery without significant stenosis. This was followed all the way up to the axilla, which is where it appeared the bifurcation of the radial and ulnar arteries occurred. She had a very high bifurcation but no significant stenosis within her radial artery. After the anastomosis was treated, I turned my attention to a large and mid forearm cephalic vein branch with a Kumpe catheter and a Magic torque wire I was able to navigate into this branch and confirm navigation into it with imaging. Three coils were then deployed; I used a short 8 coil and then two 4 mm coils to coil embolize the cephalic vein branch. This was successfully coiled on imaging. I then went back down the native cephalic vein to the forearm to treat the stenosis  in the mid forearm cephalic vein. At the area of this branch, I used an 8 mm diameter angioplasty balloon. This resulted in a significant extravasation and near occlusion of the fistula. We quickly exchanged for a 7 French sheath and an 0.018 wire and covered this lesion  initially with an 8 mm diameter x 10 cm in length Viabahn covered stent taken back to 4 or 5 cm from the anastomosis. This was within the cephalic vein. This was post dilated with an 8 mm balloon with residual extravasation at the edge of the stent nearest the anastomosis. I then took a short 8 mm diameter x 2.5 cm Viabahn covered stent and deployed it in this location. We are now probably 2-3 cm from the anastomosis, making sure not to occlude flow in the radial artery. A 6 mm balloon was used in the vein and then an 8 mm balloon for the stent-stent apposition. This resulted in markedly improved flow and a brisk thrill was palpable within the AV fistula. No extravasation was seen at this point at this point. At this point, I elected to terminate the procedure. The sheath was removed, 4-0 Monocryl pursestring suture was placed around the sheath access site. Pressure was held and a sterile dressing was placed. The patient tolerated the procedure well and was taken to the recovery room in stable condition.    ____________________________ Algernon Huxley, MD jsd:bm D: 10/09/2014 16:00:18 ET T: 10/09/2014 20:11:46 ET JOB#: [id number]  cc: Algernon Huxley, MD, <Dictator> Algernon Huxley MD ELECTRONICALLY SIGNED 10/15/2014 14:05

## 2015-01-04 NOTE — H&P (Signed)
PATIENT NAME:  Dawn Bradford, Dawn Bradford MR#:  [id number] DATE OF BIRTH:  01-04-1936  DATE OF ADMISSION:  09/12/2014  PRIMARY CARE PHYSICIAN: Kerin Perna MD  NEPHROLOGIST: Tama High, MD   VASCULAR SURGEON: Katha Cabal, MD  CHIEF COMPLAINT: Worsening of shortness of breath and lower extremity swelling.   HISTORY OF PRESENT ILLNESS: The patient is a 79 year old pleasant Caucasian female with multiple medical problems, including chronic renal insufficiency. Follows up with nephrology as an outpatient on a regular basis. She was seen by Dr. Holley Raring today for worsening of shortness of breath and lower extremity swelling. The patient has chronic kidney disease. Her blood work has revealed worsening of the renal function, and Dr. Holley Raring thinks that she has reached to end-stage renal disease stage, and he has sent the patient as a direct admission because of her shortness of breath. During my examination, the patient is resting comfortably, denies any shortness of breath while resting. Denies any chest pain. No dizziness or loss of consciousness. Denies any fevers. After my discussion with Dr. Holley Raring, the plan is to consult vascular surgery regarding PermCath placement and to start hemodialysis as soon as possible. The patient is concerned that her lower extremity swelling and shortness of breath has been getting worse, and she is having a hard time breathing. No family members at bedside. No other complaints.   PAST MEDICAL HISTORY: Coronary artery disease, hypertension, hypothyroidism, TIA, diabetes mellitus, obstructive sleep apnea, uses CPAP, history of asthma, and chronic respiratory failure, lives on 2 liters of oxygen, depression and anxiety, morbid obesity, anemia of chronic kidney disease, questionable history of pulmonary nodules.  PAST SURGICAL HISTORY: AV fistula, not mature enough to do dialysis; coronary artery bypass grafting.   ALLERGIES: ACE INHIBITORS, AMLODIPINE, CLONIDINE, CODEINE,  METFORMIN.    PSYCHOSOCIAL HISTORY: Lives at home with husband. Denies any history of smoking, alcohol, or illicit drug usage.   FAMILY HISTORY: Diabetes runs in her family.   REVIEW OF SYSTEMS:  CONSTITUTIONAL: Denies any fever. Complaining of fatigue and weakness,  weight gain.  EYES: Denies blurry vision, double vision or discharge. EARS, NOSE, AND THROAT:  No epistaxis or discharge.  RESPIRATORY: The patient denies cough. Has COPD. Has chronic history of asthma, and the patient has chronic hypoxic respiratory failure, lives on 2 liters of oxygen.  CARDIOVASCULAR: History of coronary artery disease, status post CABG. Denies any syncope.  GASTROINTESTINAL: Denies any nausea, vomiting, hematemesis, or melena.  NEUROLOGICAL: Has history of TIA. No history of strokes. Denies any ataxia.  PSYCHIATRIC: Has history of anxiety.  MUSCULOSKELETAL: Has a history of osteoarthritis.   HOME MEDICATIONS: Vitamin D3, 500 international units 1 capsule p.o. once daily, Tylenol 2 tablets p.o. every 6 hours as needed, triamcinolone topical cream apply topically to affected area, trazodone 50 mg 1 capsule p.o. once daily, tramadol 50 mg p.o. q. 6 hours. as needed for pain, Tandem 1 capsule p.o. once daily, oxygen 2 liters via nasal cannula, Micardis 80 mg 1 tablet p.o. once daily, levothyroxine 75 mcg p.o. once daily, Lantus 38 units subcutaneously once daily at bedtime, hydralazine 25 mg 1 tablet p.o. 3 times a day. Humalog 10 units subcutaneously 2 times a day, furosemide 40 mg 1 tablet p.o. 2 times a day, fluoxetine 10 mg 1 capsule p.o. once daily, iron sulfate 325 mg 1 tablet p.o. once daily, Crestor 40 mg 1 capsule p.o. once daily, Coreg 20 mg 1 tablet p.o. b.i.d., coenzyme Q 300 mg 1 capsule p.o. once daily, aspirin  81 mg 1 capsule p.o. once daily, albuterol 2 puffs inhalation every 6 hours as needed.  PHYSICAL EXAMINATION:  VITAL SIGNS: Temperature 97.8, pulse 66, respirations 18, blood pressure 152/66,  pulse oximetry 95% on 2 liters.  GENERAL APPEARANCE: Not in acute distress. Morbidly obese.  HEENT: Normocephalic, atraumatic. Pupils are equally reacting to light and accommodation. No scleral icterus. No conjunctival injection. No sinus tenderness. No postnasal drip. Moist mucous membranes.  NECK: Supple. No JVD. No thyromegaly. Range of motion is intact.  LUNGS: Rhonchi are present  No crackles. Moderate air entry. CARDIAC: S1, S2 normal, regular rate and rhythm.  GASTROINTESTINAL: Soft, obese. Bowel sounds are positive in all 4 quadrants. Nontender, nondistended. No masses felt.  NEUROLOGIC: Awake, alert, oriented x 3. Cranial nerves II through XII are grossly intact.  Motor and sensory are intact. Reflexes are 2+.  EXTREMITIES: 2+ edema is present. No cyanosis. No clubbing, AV fistula is present. PSYCHIATRIC: Normal mood and affect.   LABORATORY DATA AND IMAGING STUDIES: A 12-lead EKG and chest x-ray are ordered, which are pending. CBC, BMP, hemoglobin A1c, beta natriuretic peptide are ordered. These are all pending.   ASSESSMENT AND PLAN: A 79 year old Caucasian female sent over to the hospital as a direct admission from Dr. Elwyn Lade office because of her worsening of chronic kidney disease and worsening of shortness of breath and lower extremity edema.  1. Acute respiratory distress with worsening of lower extremity edema secondary to fluid overload. We will put her on oxygen. The patient is admitted to telemetry. Lasix 40 mg IV every 12 hours is ordered.  2. Worsening of chronic kidney disease and now at end-stage renal disease. Vascular surgery is consulted, and the patient is taken to special procedures for a PermCath placement today by Dr. Delana Meyer. The patient will subsequently get hemodialysis as soon as possible. Consult is placed with nephrology, Dr. Holley Raring is aware.  3. Acute congestive heart failure. We will obtain an echocardiogram. We will monitor daily weights. We will monitor  intake and output. We will obtain echocardiogram.  4. Diabetes mellitus. The patient will be on sliding scale insulin and resume her home medication.  5. Obstructive sleep apnea.  Continue her CPAP at bedtime.  6. Coronary artery disease. Denies any chest pain at this time. The patient will be continued on Micardis, aspirin, and Coreg. 7. For transient ischemic attack, the patient will be on aspirin and statin. Currently denies any weakness.  8. Hypothyroidism. Continue levothyroxine.  9. We will provide GI and DVT prophylaxis with Pepcid and heparin subcutaneously.   CODE STATUS: She is FULL CODE. Husband is the medical power of attorney.   The plan of care was discussed in detail with the patient. She is agreeable with the plan.    TOTAL TIME SPENT: 50 minutes.  ____________________________ Nicholes Mango, MD ag:mw D: 09/12/2014 14:30:00 ET T: 09/12/2014 15:23:43 ET JOB#: [id number]  cc: Tama High, MD Katha Cabal, MD Kerin Perna, MD Nicholes Mango, MD, <Dictator>      Nicholes Mango MD ELECTRONICALLY SIGNED 09/20/2014 15:23

## 2015-01-04 NOTE — Op Note (Signed)
PATIENT NAME:  [name], Dawn Bradford MR#:  [id number] DATE OF BIRTH:  1936/01/13  DATE OF PROCEDURE:  09/12/2014  PREOPERATIVE DIAGNOSES:  1.  End-stage renal disease requiring hemodialysis.  2.  Hypertension.  POSTOPERATIVE DIAGNOSES: 1.  End-stage renal disease requiring hemodialysis.  2.  Hypertension.    PROCEDURE PERFORMED: Insertion of right internal jugular cuffed tunneled dialysis catheter with ultrasound and fluoroscopic guidance.   SURGEON: Katha Cabal, MD   SEDATION: Versed 2 mg plus fentanyl 100 mcg administered IV. Continuous ECG, pulse oximetry and cardiopulmonary monitoring is performed throughout the entire procedure by the interventional radiology nurse. Total sedation time is 30 minutes.   ACCESS: Right internal jugular.   CONTRAST USED: None.   FLUOROSCOPY TIME: Approximately 1-2 minutes.   INDICATIONS: [name] is a 79 year old woman who has now reached a point of end-stage renal function and is requiring hemodialysis. She will have a tunneled catheter inserted so that dialysis may be initiated, and subsequently her left arm fistula will be monitored for maturation.   DESCRIPTION OF PROCEDURE: The patient is taken to the Special Procedure Suite, placed in the supine position. After adequate sedation is achieved, the right neck is prepped and draped in a sterile fashion, as is the chest wall. Ultrasound is placed in a sterile sleeve. Jugular vein is identified with the ultrasound; it is echolucent and compressible, indicating patency. Images are recorded for the permanent record.   Under real-time visualization, after 1% lidocaine has been infiltrated in the soft tissues, access is obtained with a micropuncture kit, microwire followed by micro sheath. Approximately 30 mL of blood is aspirated through the micro sheath and then passed off the field to be sent for her previously ordered blood tests.   A J-wire is then advanced under fluoroscopic guidance into the inferior  vena cava. Counter incision is made with an 11 blade scalpel. The dilator is passed over the wire. Dilator and peel-away sheath are inserted and the wire and dilator are removed. A 19 cm tip-to-cuff Cannon catheter is then inserted through the peel-away sheath, and the peel-away sheath is removed. Under fluoroscopy,  the tips are positioned at the atriocaval junction, and the catheter is approximated to the chest wall. Exit site is selected. A small incision is made. Tunneling device is passed subcutaneously, and the catheter is then pulled through the subcutaneous path.   The catheter is transected and the hub is connected. Both lumens aspirate and flush easily. It is then reinspected under fluoroscopy to demonstrate the tips are still in good position and the contour of the catheter is smooth and free of kinks. Once this is verified, 5000 units of heparin is packed in each lumen. The catheter is secured to the chest wall with 0 silk suture. The neck counterincision is closed with 4-0 Monocryl and Dermabond. Sterile dressing is applied.   The patient tolerated the procedure well and there were no immediate complications.    ____________________________ Katha Cabal, MD ggs:MT D: 09/12/2014 14:34:29 ET T: 09/12/2014 15:20:08 ET JOB#: [id number]  cc: Katha Cabal, MD, <Dictator> Katha Cabal MD ELECTRONICALLY SIGNED 09/16/2014 17:34

## 2015-01-04 NOTE — Consult Note (Signed)
General Aspect Acute on chronic renal disease   Present Illness The patient is a 79 yo female with multiple medical problems, including chronic renal insufficiency.  She was seen by Dr. Holley Raring today for worsening of shortness of breath and lower extremity swelling.  Blood work done today revealed worsening of the renal function. Denies any chest pain. No dizziness or loss of consciousness. Denies any fevers. The patient is concerned that her lower extremity swelling and shortness of breath has been getting worse, and she is having a hard time breathing. No other complaints.   PAST MEDICAL HISTORY: Coronary artery disease, hypertension, hypothyroidism, TIA, diabetes mellitus, obstructive sleep apnea, uses CPAP, history of asthma, and chronic respiratory failure, lives on 2 liters of oxygen, depression and anxiety, morbid obesity, anemia of chronic kidney disease, questionable history of pulmonary nodules.  PAST SURGICAL HISTORY: AV fistula, not mature enough to do dialysis; coronary artery bypass grafting.   Home Medications: Medication Instructions Status  carvedilol 25 mg tablet 1 tab(s) orally 2 times a day Active  hydrALAZINE 25 mg oral tablet 1 tab(s) orally 3 times a day Active  Tylenol Caplet Extra Strength 500 mg oral tablet 2 tab(s) orally every 6 hours, As Needed Active  albuterol CFC free 90 mcg/inh inhalation aerosol 2 puff(s) inhaled every 6 hours, As Needed - for Wheezing Active  aspirin 81 mg oral tablet 1 tab(s) orally once a day (at bedtime) Active  CoQ10 300 mg oral capsule 1 cap(s) orally once a day (at bedtime) Active  Vitamin D3 5000 intl units oral capsule 1 cap(s) orally once a day Active  FLUoxetine 10 mg oral tablet 1 tab(s) orally once a day (at bedtime) Active  furosemide 40 mg oral tablet 1 tab(s) orally 2 times a day Active  levothyroxine 75 mcg (0.075 mg) oral tablet 1 tab(s) orally once a day Active  Micardis 80 mg tablet 1 tab(s) orally once a day (in the morning)  Active  Crestor 40 mg oral tablet 1 tab(s) orally once a day (at bedtime) Active  traZODone 50 mg oral tablet 1 tab(s) orally once a day (at bedtime) Active  Tandem 162 mg-115.2 mg oral capsule 1 cap(s) orally once a day Active  triamcinolone topical 0.5% topical cream Apply topically to affected area 2 times a day, As Needed Active  HumaLOG KwikPen 100 units/mL subcutaneous solution 10 unit(s) subcutaneous 2 times a day (before  lunch and supper)  Active  Lantus 100 units/mL subcutaneous solution 38 unit(s) subcutaneous once a day (at bedtime) Active  oxygen 2 liter(s) per min inhaled , As Needed Active  traMADol 50 mg oral tablet 1 tab(s) orally every 6 hours, As Needed Active    Codeine: Rash  Metformin: N/V/Diarrhea  surgical drape: Blisters  Clonidine: Unknown  Amlodipine: Unknown  Ace Inhibitors: Unknown  Case History:  Family History Non-Contributory   Social History negative tobacco, negative ETOH, negative Illicit drugs   Review of Systems:  Fever/Chills No   Cough No   Sputum No   Abdominal Pain No   Diarrhea No   Constipation No   Nausea/Vomiting No   SOB/DOE Yes   Chest Pain No   Telemetry Reviewed NSR   Dysuria No   Tolerating Diet Yes   Physical Exam:  GEN well developed, well nourished, mild distress   HEENT hearing intact to voice, moist oral mucosa, allopecia   NECK supple  trachea midline   RESP postive use of accessory muscles  crackles   CARD regular  rate  LE edema present  positive JVD   ABD denies tenderness  obese nondistended   EXTR negative cyanosis/clubbing, positive edema   SKIN No rashes, No ulcers, tight to palpation   NEURO cranial nerves intact, follows commands   PSYCH alert, good insight   Nursing/Ancillary Notes: **Vital Signs.:   08-Jan-16 12:57  Vital Signs Type Admission  Temperature Temperature (F) 97.8  Celsius 36.5  Temperature Source oral  Pulse Pulse 66  Respirations Respirations 18  Systolic BP  Systolic BP 638  Diastolic BP (mmHg) Diastolic BP (mmHg) 66  Mean BP 94  Pulse Ox % Pulse Ox % 95  Pulse Ox Activity Level  At rest  Oxygen Delivery 2L   Thyroid:  08-Jan-16 13:53   Thyroid Stimulating Hormone 1.63 (0.45-4.50 (IU = International Unit)  ----------------------- Pregnant patients have  different reference  ranges for TSH:  - - - - - - - - - -  Pregnant, first trimetser:  0.36 - 2.50 uIU/mL)  Hepatic:  08-Jan-16 13:53   Albumin, Serum  3.1  Bilirubin, Total 0.3  Alkaline Phosphatase 81 (46-116 NOTE: New Reference Range 03/25/14)  SGPT (ALT) 20 (14-63 NOTE: New Reference Range 03/25/14)  SGOT (AST) 24  Total Protein, Serum 6.5    14:29   Albumin, Serum  3.0  General Ref:  08-Jan-16 14:29   HBsAg ========== TEST NAME ==========  ========= RESULTS =========  = REFERENCE RANGE =  HEPATITIS B SURFACE AG  HBsAg Screen HBsAg Screen                    [   Negative             ]          Negative               [facility]            No: [id number]           [address], [place], [place] [address]           Lindon Romp, MD         [phone]   Result(s) reported on 13 Sep 2014 at 05:49AM.    15:47   Parathyroid Hormone, Intact ========== TEST NAME ==========  ========= RESULTS =========  = REFERENCE RANGE =  PTH INTACT  PTH, Intact PTH, Intact                     [H  80 pg/mL             ]             [address]               [facility]            No: [id number]           [address], [place], [place] [address]           Lindon Romp, MD         [phone]   Result(s) reported on 13 Sep 2014 at 08:19AM.  Hepatitis B Surface Antibody, Qual ========== TEST NAME ==========  ========= RESULTS =========  = REFERENCE RANGE =  HEPATITIS B SURF.AB,QUAL  Hep B Surface Ab Hep B Surface Ab, Qual          [   Reactive             ]  Non Reactive: Inconsistent with immunity,                                             less than 10 mIU/mL                              Reactive:     Consistent with immunity,                                            greater than 9.9 mIU/mL               [facility]            No: [id number]           [address], [place], [place] [address]           Lindon Romp, MD         [phone]   Result(s) reported on 13 Sep 2014 at 10:17AM.  Cardiology:  08-Jan-16 15:04   Ventricular Rate 61  Atrial Rate 61  P-R Interval 214  QRS Duration 100  QT 460  QTc 463  P Axis 69  R Axis 47  T Axis 86  ECG interpretation Sinus rhythm with 1st degree A-V block Otherwise normal ECG When compared with ECG of 03-Jul-2014 14:01, No significant change was found Confirmed by Ronnald Collum, SAM ([id number]) on 09/15/2014 4:37:33 PM  Overreader: Hipolito Bayley  Routine Chem:  08-Jan-16 13:53   Glucose, Serum  125  BUN  51  Creatinine (comp)  3.81  Sodium, Serum 136  Potassium, Serum 3.7  Chloride, Serum 101  CO2, Serum 28  Calcium (Total), Serum  8.4  Anion Gap 7  Osmolality (calc) 287  eGFR (African American)  15  eGFR (Non-African American)  12 (eGFR values <33m/min/1.73 m2 may be an indication of chronic kidney disease (CKD). Calculated eGFR, using the MRDR Study equation, is useful in  patients with stable renal function. The eGFR calculation will not be reliable in acutely ill patients when serum creatinine is changing rapidly. It is not useful in patients on dialysis. The eGFR calculation may not be applicable to patients at the low and high extremes of body sizes, pregnant women, and vegetarians.)  B-Type Natriuretic Peptide ([place]  801 (Result(s) reported on 12 Sep 2014 at 03:00PM.)  Cholesterol, Serum 105  Triglycerides, Serum 95  HDL (INHOUSE) 44  VLDL Cholesterol Calculated 19  LDL Cholesterol Calculated 42 (Result(s) reported on 12 Sep 2014 at 03:02PM.)    14:29   Glucose, Serum  127  BUN  50  Creatinine  (comp)  3.87  Sodium, Serum 138  Potassium, Serum 3.7  Chloride, Serum 102  CO2, Serum 28  Calcium (Total), Serum 8.6  Phosphorus, Serum 3.8  Anion Gap 8  Osmolality (calc) 291  eGFR (African American)  15  eGFR (Non-African American)  12 (eGFR values <620mmin/1.73 m2 may be an indication of chronic kidney disease (CKD). Calculated eGFR, using the MRDR Study equation, is useful in  patients with stable renal function. The eGFR calculation will not be reliable in acutely ill patients when serum creatinine is changing rapidly. It is not useful in patients on dialysis. The eGFR calculation may  not be applicable to patients at the low and high extremes of body sizes, pregnant women, and vegetarians.)  Result Comment HEMOGLOBIN - CANCELED DUPLICATE ORDER BY JEF  Result(s) reported on 12 Sep 2014 at 02:54PM.    15:47   Iron Binding Capacity (TIBC)  237  Unbound Iron Binding Capacity 185  Iron, Serum 52  Iron Saturation 22 (Result(s) reported on 12 Sep 2014 at 04:30PM.)  Hemoglobin A1c ([facility])  7.2 (The American Diabetes Association recommends that a primary goal of therapy should be <7% and that physicians should reevaluate the treatment regimen in patients with HbA1c values consistently >8%.)  Ferritin ([place]) 213 (Result(s) reported on 12 Sep 2014 at 05:05PM.)    19:15   Result Comment CK TOTAL/MMB - Slight hemolysis, interpret results with  - caution.  Result(s) reported on 12 Sep 2014 at 08:40PM.  Cardiac:  08-Jan-16 14:34   Troponin I < 0.02 (0.00-0.05 0.05 ng/mL or less: NEGATIVE  Repeat testing in 3-6 hrs  if clinically indicated. >0.05 ng/mL: POTENTIAL  MYOCARDIAL INJURY. Repeat  testing in 3-6 hrs if  clinically indicated. NOTE: An increase or decrease  of 30% or more on serial  testing suggests a  clinically important change)  CK, Total 90  CPK-MB, Serum 1.1 (Result(s) reported on 12 Sep 2014 at 04:05PM.)    19:15   Troponin I < 0.02 (0.00-0.05 0.05 ng/mL or  less: NEGATIVE  Repeat testing in 3-6 hrs  if clinically indicated. >0.05 ng/mL: POTENTIAL  MYOCARDIAL INJURY. Repeat  testing in 3-6 hrs if  clinically indicated. NOTE: An increase or decrease  of 30% or more on serial  testing suggests a  clinically important change)  CK, Total 120  CPK-MB, Serum 1.1    23:04   Troponin I < 0.02 (0.00-0.05 0.05 ng/mL or less: NEGATIVE  Repeat testing in 3-6 hrs  if clinically indicated. >0.05 ng/mL: POTENTIAL  MYOCARDIAL INJURY. Repeat  testing in 3-6 hrs if  clinically indicated. NOTE: An increase or decrease  of 30% or more on serial  testing suggests a  clinically important change)  CK, Total 93  CPK-MB, Serum 0.8 (Result(s) reported on 12 Sep 2014 at 11:38PM.)  Routine Coag:  08-Jan-16 13:53   Prothrombin  15.0  INR 1.2 (INR reference interval applies to patients on anticoagulant therapy. A single INR therapeutic range for coumarins is not optimal for all indications; however, the suggested range for most indications is 2.0 - 3.0. Exceptions to the INR Reference Range may include: Prosthetic heart valves, acute myocardial infarction, prevention of myocardial infarction, and combinations of aspirin and anticoagulant. The need for a higher or lower target INR must be assessed individually. Reference: The Pharmacology and Management of the Vitamin K  antagonists: the seventh ACCP Conference on Antithrombotic and Thrombolytic Therapy. TFTDD.2202 Sept:126 (3suppl): N9146842. A HCT value >55% may artifactually increase the PT.  In one study,  the increase was an average of 25%. Reference:  "Effect on Routine and Special Coagulation Testing Values of Citrate Anticoagulant Adjustment in Patients with High HCT Values." American Journal of Clinical Pathology 2006;126:400-405.)  Routine Hem:  08-Jan-16 13:53   WBC (CBC) 8.9  RBC (CBC)  3.42  Hemoglobin (CBC)  11.2  Hematocrit (CBC)  34.1  Platelet Count (CBC)  133  MCV 100  MCH 32.7   MCHC 32.8  RDW 13.6  Neutrophil % 69.5  Lymphocyte % 18.0  Monocyte % 7.1  Eosinophil % 5.0  Basophil % 0.4  Neutrophil # 6.2  Lymphocyte #  1.6  Monocyte # 0.6  Eosinophil # 0.4  Basophil # 0.0 (Result(s) reported on 12 Sep 2014 at 02:43PM.)    14:29   Hemoglobin (CBC) -   XRay:    08-Jan-16 18:09, Chest PA and Lateral  Chest PA and Lateral   REASON FOR EXAM:    sob  COMMENTS:       PROCEDURE: DXR - DXR CHEST PA (OR AP) AND LATERAL  - Sep 12 2014  6:09PM     CLINICAL DATA:  Obese, CHF, lower extremity edema, shortness of  breath    EXAM:  CHEST  2 VIEW    COMPARISON:  07/03/2014    FINDINGS:  There is a dual-lumen right-sided central venous catheter in  satisfactory position. There is mild bilateral interstitial  thickening. There is no focal parenchymal opacity, pleural effusion,  or pneumothorax. Stable cardiomediastinal silhouette.There is  evidence of prior CABG.    There is no acute osseous abnormality. There is a spinal stimulator  noted.     IMPRESSION:  No active cardiopulmonary disease.      Electronically Signed    By: Kathreen Devoid    On: 09/12/2014 18:30     Verified By: Jennette Banker, M.D., MD    Impression 1. Acute on chronic kidney disease now at end-stage. The risks and benefits for placement of a PermCath are reviewed with the patient. The patient has agreed to proceed. The patient will subsequently get hemodialysis as soon as possible. Consult is placed with nephrology, Dr. Holley Raring is aware.  2. Coronary artery disease. Denies any chest pain at this time. The patient will be continued on Micardis, aspirin, and Coreg 3. Acute congestive heart failure.  monitor daily weights. We will monitor intake and output. obtain echocardiogram.  4. Diabetes mellitus. The patient will be on sliding scale insulin and resume her home medication.  5. Obstructive sleep apnea.  Continue her CPAP at bedtime.  6. .Hypothyroidism. Continue levothyroxine. 7. For  transient ischemic attack, the patient will be on aspirin and statin. Currently denies any weakness.   Plan level 3 consult   Electronic Signatures: Hortencia Pilar (MD)  (Signed 12-Jan-16 22:07)  Authored: General Aspect/Present Illness, Home Medications, Allergies, History and Physical Exam, Vital Signs, Labs, Radiology, Impression/Plan   Last Updated: 12-Jan-16 22:07 by Hortencia Pilar (MD)

## 2015-01-04 NOTE — Discharge Summary (Signed)
PATIENT NAME:  Dawn Bradford, [name] MR#:  [id number] DATE OF BIRTH:  [birth year]  DATE OF ADMISSION:  09/12/2014 DATE OF DISCHARGE:  09/15/2014  PRESENTING COMPLAINT:  Worsening shortness of breath and lower extremity swelling.   DISCHARGE DIAGNOSES: 1.  End-stage renal disease, now on hemodialysis.  2.  Hypertension.  3.  Type 2 diabetes.   CODE STATUS:  Full code.   MEDICATIONS AT DISCHARGE: 1.  Coreg 25 mg b.i.d.  2.  Hydralazine 25 mg p.o. t.i.d.  3.  Tylenol 500 two tablets every 6 hours as needed.  4.  Albuterol 2 puffs every 6 hours as needed.  5.  Aspirin 81 mg daily at bedtime.  6.  CoQ10, 300 mg 1 capsule at bedtime.  7.  Vitamin D3, 5000 international units p.o. daily.  8.  Fluoxetine 10 mg at bedtime.  9.  Lasix 40 mg b.i.d.  10.  Levothyroxine 75 mcg p.o. daily.  11.  Micardis 80 mg p.o. daily.  12.  Crestor 40 mg daily at bedtime.  13.  Trazodone 50 mg p.o. at bedtime.  14.  Tandem 162 mg 1 capsule once a day.  15.  Triamcinolone 0.5% topical cream to affected areas.  16.  Humalog 10 units b.i.d. before lunch and supper.  17.  Lantus 38 units at bedtime.  18.  Oxygen 2 liters per minute.  19.  Tramadol 50 mg every 6 hours as needed.   DIET:  Low sodium, renal, carbohydrate controlled.   FOLLOWUP: 1.  Follow up with Dr. Juleen China.  2.  Follow up with Dr. Hoy Morn, PCP, in 1 to 2 weeks.   CONSULTATIONS: 1.  Dr. Delana Meyer of vascular.  2.  Dr. Juleen China of nephrology.   PROCEDURES:  Insertion of right internal jugular cuffed tunneled dialysis catheter with ultrasound and fluoroscopy guidance. Left upper extremity fistulogram, percutaneous transluminal angioplasty of anastomosis, percutaneous transluminal angioplasty of mid forearm cephalic vein; this was done by Dr. Lucky Cowboy on January 11.   BRIEF SUMMARY OF HOSPITAL COURSE:  [name] is a 79 year old Caucasian female with history of chronic kidney disease, who came in with progressive worsening shortness of breath and leg edema.  She was seen by her nephrologist with worsening creatinine, who sent her for:   1.  Acute respiratory distress with worsening lower extremity edema secondary to pulmonary edema and volume overload. She was started on hemodialysis January 8 after tunneled dialysis catheter was placed by Dr. Delana Meyer. The patient tolerated dialysis well.  2.  End-stage renal disease, now on dialysis, new. She has been set up at [facility] on [facility] Monday, Wednesday, and Friday.  3.  Acute-on-chronic congestive heart failure, diastolic. The patient's symptoms improved after she was started on hemodialysis with ultrafiltration.  4.  Type 2 diabetes. Home medications were continued including insulin.  5.  Obstructive sleep apnea. Continue CPAP.  6.  Hypertension, on Coreg and hydralazine.  7.  Coronary artery disease. Continue aspirin.   TIME SPENT:  40 minutes.   ____________________________ Hart Rochester Posey Pronto, MD sap:nb D: 09/16/2014 16:33:59 ET T: 09/16/2014 23:50:05 ET JOB#: [id number]  cc: Marquail Bradwell A. Posey Pronto, MD, <Dictator> Kerin Perna, MD Mamie Levers, MD Katha Cabal, MD Ilda Basset MD ELECTRONICALLY SIGNED 09/18/2014 11:21

## 2015-01-04 NOTE — Op Note (Signed)
PATIENT NAME:  Dawn Bradford, Dawn Bradford MR#:  [id number] DATE OF BIRTH:  1935-12-15  DATE OF PROCEDURE:  09/15/2014  PREOPERATIVE DIAGNOSES:  1.  End-stage renal disease.  2.  Poor maturation of the left radiocephalic arteriovenous fistula.  3.  Hypertension.   POSTOPERATIVE DIAGNOSES: 1.  End-stage renal disease.  2.  Poor maturation of the left radiocephalic arteriovenous fistula.  3.  Hypertension.  PROCEDURES: 1.  Ultrasound guidance for vascular access to left radiocephalic arteriovenous fistula in a retrograde fashion.  2.  Left upper extremity fistulogram and central venogram.  3.  Percutaneous transluminal angioplasty of anastomotic stenosis with 4 mm diameter drug-coated angioplasty balloon.  4.  Percutaneous transluminal angioplasty of mid forearm cephalic vein stenosis with 5 mm diameter angioplasty balloon.   SURGEON: Algernon Huxley, MD   ANESTHESIA: Local with moderate conscious sedation.   ESTIMATED BLOOD LOSS: Minimal.   INDICATION FOR PROCEDURE: A 79 year old [place] American female whose radiocephalic AV fistula has not yet matured. She was admitted, a PermCath was placed. The nephrologist requested a fistulogram to see if there is anything we can do to expedite maturation and evaluate the fistula. Risks and benefits were discussed. Informed consent was obtained.   DESCRIPTION OF PROCEDURE: The patient is brought to the vascular suite. The left upper extremity was sterilely prepped and draped, and a sterile surgical field was created. The fistula was accessed just below the antecubital fossa in a retrograde fashion. Under direct ultrasound guidance with a micropuncture needle and a permanent image was recorded, a micropuncture wire and sheath were placed, and we upsized to a 6 French sheath and gave 3000 units of intravenous heparin. The Kumpe catheter was placed into the radial artery and evaluation was performed. There was a near occlusive stenosis at just beyond the anastomosis from  intimal hyperplasia. There was then almost codominant branches of the cephalic vein in the forearm. The branch we were in had a stenosis in the mid forearm that appeared to be flow limiting and more blood flow seemed to be going out of the branch. The outflow in the upper arm was dual through the basilic vein and the cephalic vein, and the central venous circulation was patent. I crossed the lesion without difficulty with a Magic torque wire. A 4 mm diameter Lutonix drug-coated angioplasty balloon was inflated at the anastomosis and just into the radial artery. The narrowing was seen, which resolved with balloon deflation. This area appeared markedly better, but in the cephalic vein several centimeters downstream in a separate and distinct area, there was a significant stenosis. I treated this lesion with a 5 mm diameter high-pressure angioplasty balloon. Again, a waste was seen with inflation, which resolved with angioplasty. The balloon was deflated. Completion angiogram with Kumpe catheter back at the anastomosis showed the perianastomotic stenosis to be much better. The mid forearm cephalic vein stenosis was also improved, although the majority of the flow still went out the more lateral branch of the cephalic vein. At this point, I elected to terminate the procedure. The sheath was removed. A 4-0 Monocryl pursestring suture was placed. Pressure was held. Sterile dressings were placed. The patient tolerated the procedure well and was taken to the recovery room in stable condition.    ____________________________ Algernon Huxley, MD jsd:TT D: 09/15/2014 15:30:01 ET T: 09/15/2014 16:46:41 ET JOB#: [id number]  cc: Algernon Huxley, MD, <Dictator> Algernon Huxley MD ELECTRONICALLY SIGNED 09/16/2014 13:39

## 2015-01-06 ENCOUNTER — Ambulatory Visit
Admission: RE | Admit: 2015-01-06 | Discharge: 2015-01-06 | Disposition: A | Discharge status: Home / Self Care | Payer: Medicare Other | Source: Ambulatory Visit | Attending: Vascular Surgery | Admitting: Vascular Surgery

## 2015-01-06 ENCOUNTER — Encounter
Admission: RE | Disposition: A | Discharge status: Home / Self Care | Payer: Medicare Other | Source: Ambulatory Visit | Attending: Vascular Surgery

## 2015-01-06 ENCOUNTER — Encounter: Payer: Self-pay | Admitting: *Deleted

## 2015-01-06 DIAGNOSIS — E780 Pure hypercholesterolemia: Secondary | ICD-10-CM | POA: Insufficient documentation

## 2015-01-06 DIAGNOSIS — N186 End stage renal disease: Secondary | ICD-10-CM | POA: Insufficient documentation

## 2015-01-06 DIAGNOSIS — T82898A Other specified complication of vascular prosthetic devices, implants and grafts, initial encounter: Secondary | ICD-10-CM | POA: Diagnosis present

## 2015-01-06 DIAGNOSIS — E119 Type 2 diabetes mellitus without complications: Secondary | ICD-10-CM | POA: Diagnosis not present

## 2015-01-06 DIAGNOSIS — Z992 Dependence on renal dialysis: Secondary | ICD-10-CM | POA: Diagnosis not present

## 2015-01-06 DIAGNOSIS — Z794 Long term (current) use of insulin: Secondary | ICD-10-CM | POA: Diagnosis not present

## 2015-01-06 DIAGNOSIS — I252 Old myocardial infarction: Secondary | ICD-10-CM | POA: Insufficient documentation

## 2015-01-06 DIAGNOSIS — I120 Hypertensive chronic kidney disease with stage 5 chronic kidney disease or end stage renal disease: Secondary | ICD-10-CM | POA: Insufficient documentation

## 2015-01-06 DIAGNOSIS — Z79899 Other long term (current) drug therapy: Secondary | ICD-10-CM | POA: Diagnosis not present

## 2015-01-06 DIAGNOSIS — Z87891 Personal history of nicotine dependence: Secondary | ICD-10-CM | POA: Diagnosis not present

## 2015-01-06 HISTORY — PX: PERIPHERAL VASCULAR CATHETERIZATION: SHX172C

## 2015-01-06 HISTORY — DX: Acute myocardial infarction, unspecified: I21.9

## 2015-01-06 LAB — BASIC METABOLIC PANEL
Anion gap: 9 (ref 5–15)
BUN: 22 mg/dL — ABNORMAL HIGH (ref 6–20)
CHLORIDE: 99 mmol/L — AB (ref 101–111)
CO2: 29 mmol/L (ref 22–32)
CREATININE: 2.5 mg/dL — AB (ref 0.44–1.00)
Calcium: 8.7 mg/dL — ABNORMAL LOW (ref 8.9–10.3)
GFR calc non Af Amer: 17 mL/min — ABNORMAL LOW (ref >60)
GFR, EST AFRICAN AMERICAN: 20 mL/min — AB (ref >60)
Glucose, Bld: 103 mg/dL — ABNORMAL HIGH (ref 65–99)
POTASSIUM: 3.4 mmol/L — AB (ref 3.5–5.1)
Sodium: 137 mmol/L (ref 135–145)

## 2015-01-06 SURGERY — A/V SHUNTOGRAM/FISTULAGRAM
Anesthesia: Moderate Sedation | Site: Arm Upper | Laterality: Left

## 2015-01-06 SURGERY — Surgical Case
Anesthesia: *Unknown

## 2015-01-06 MED ORDER — FENTANYL CITRATE (PF) 100 MCG/2ML IJ SOLN
INTRAMUSCULAR | Status: DC | PRN
  Administered 2015-01-06: 50 ug
  Administered 2015-01-06: 50 ug via INTRAVENOUS

## 2015-01-06 MED ORDER — MIDAZOLAM HCL 2 MG/2ML IJ SOLN
INTRAMUSCULAR | Status: DC | PRN
  Administered 2015-01-06: 2 mg via INTRAVENOUS
  Administered 2015-01-06: 2 mg

## 2015-01-06 MED ORDER — SODIUM CHLORIDE 0.9 % IV SOLN
INTRAVENOUS | Status: DC
  Administered 2015-01-06: 10:00:00 via INTRAVENOUS

## 2015-01-06 MED ORDER — IOHEXOL 300 MG/ML  SOLN
INTRAMUSCULAR | Status: DC | PRN
  Administered 2015-01-06: 50 mL via INTRAVENOUS

## 2015-01-06 MED ORDER — CEFAZOLIN SODIUM 1-5 GM-% IV SOLN
1.0000 g | Freq: Once | INTRAVENOUS | Status: AC
  Administered 2015-01-06: 1 g via INTRAVENOUS
  Filled 2015-01-06: qty 50

## 2015-01-06 MED ORDER — DEXTROSE 50 % IV SOLN
12.5000 g | Freq: Once | INTRAVENOUS | Status: DC | PRN
  Filled 2015-01-06: qty 50

## 2015-01-06 SURGICAL SUPPLY — 5 items
DRAPE BRACHIAL (DRAPES) ×3
PACK ANGIOGRAPHY (CUSTOM PROCEDURE TRAY) ×3
SET INTRO CAPELLA COAXIAL (SET/KITS/TRAYS/PACK) ×3
SHEATH BRITE TIP 6FRX5.5 (SHEATH) ×3
TOWEL OR 17X26 4PK STRL BLUE (TOWEL DISPOSABLE) ×3

## 2015-01-06 NOTE — Discharge Instructions (Signed)
WATCH FOR SIGNS OF BLEEDING AND INFECTION SUCH AS; BLEEDING, SWELLING, REDNESS, PAIN THAT IS GETTING WORSE OR DOES NOT RELIEVE WITH MEDICATION, CLOUDY OR YELLOW DRAINAGE AND FEVERS.  NO DRIVING FOR 24 HOURS  REMOVE BANDAGE IN 24 HOURS  YOU MAY USE TYLENOL OR IBUPROFEN FOR DISCOMFORT

## 2015-01-06 NOTE — Op Note (Signed)
OPERATIVE NOTE   PROCEDURE: 1. left radiocephalic arteriovenous fistula cannulation  2. left arm fistulogram  PRE-OPERATIVE DIAGNOSIS: Complication left arteriovenous fistula                                                       End Stage Renal Disease  POST-OPERATIVE DIAGNOSIS: same as above   SURGEON: Katha Cabal, M.D.  ANESTHESIA: Conscious Sedation   ESTIMATED BLOOD LOSS: minimal  FINDING(S): 1. Widely patent AV fistula  SPECIMEN(S):  None  CONTRAST: 20 cc  FLUOROSCOPY TIME: Approximately 5 minutes  INDICATIONS: Dawn Bradford is a 80 y.o. female who  presents with malfunctioning left AV access.  The patient is scheduled for left radiocephalic fistulogram .  The patient is aware the risks include but are not limited to: bleeding, infection, thrombosis of the cannulated access, and possible anaphylactic reaction to the contrast.  The patient acknowledges if the access can not be salvaged a tunneled catheter will be needed and will be placed during this procedure.  The patient is aware of the risks of the procedure and elects to proceed with the angiogram and intervention.  DESCRIPTION: After full informed written consent was obtained, the patient was brought back to the Special Procedure suite and placed supine position.  Appropriate cardiopulmonary monitors were placed.  The left wrist was prepped and draped in the standard fashion.  Appropriate timeout is called. The left radiocephalic fistula  was cannulated with a micropuncture needle.  The microwire was advanced and the needle was exchanged for  a microsheath.  The J-wire was then advanced and a 6 Fr sheath inserted.  Hand injections were completed to image the access from the arterial anastomosis through the entire access.  The central venous structures were also imaged by hand injections.  Based on the images,  no intervention is required at this time.    A 4-0 Monocryl purse-string suture was sewn around the sheath.   The sheath was removed and light pressure was applied.  A sterile bandage was applied to the puncture site.    COMPLICATIONS: None  CONDITION: Dawn Bradford, M.D [facility] Office: [phone]  01/06/2015 11:56 AM

## 2015-01-14 ENCOUNTER — Encounter: Payer: Self-pay | Admitting: Vascular Surgery

## 2015-01-31 ENCOUNTER — Inpatient Hospital Stay
Admission: EM | Admit: 2015-01-31 | Discharge: 2015-02-03 | DRG: 064 | Disposition: A | Discharge status: Skilled Nursing Facility | Payer: Medicare Other | Attending: Internal Medicine | Admitting: Internal Medicine

## 2015-01-31 ENCOUNTER — Encounter: Payer: Self-pay | Admitting: *Deleted

## 2015-01-31 ENCOUNTER — Other Ambulatory Visit: Payer: Self-pay

## 2015-01-31 ENCOUNTER — Emergency Department: Payer: Medicare Other

## 2015-01-31 DIAGNOSIS — N186 End stage renal disease: Secondary | ICD-10-CM | POA: Diagnosis present

## 2015-01-31 DIAGNOSIS — I5032 Chronic diastolic (congestive) heart failure: Secondary | ICD-10-CM | POA: Diagnosis present

## 2015-01-31 DIAGNOSIS — R4702 Dysphasia: Secondary | ICD-10-CM

## 2015-01-31 DIAGNOSIS — I6529 Occlusion and stenosis of unspecified carotid artery: Secondary | ICD-10-CM | POA: Diagnosis present

## 2015-01-31 DIAGNOSIS — E662 Morbid (severe) obesity with alveolar hypoventilation: Secondary | ICD-10-CM | POA: Diagnosis present

## 2015-01-31 DIAGNOSIS — E785 Hyperlipidemia, unspecified: Secondary | ICD-10-CM | POA: Diagnosis present

## 2015-01-31 DIAGNOSIS — I639 Cerebral infarction, unspecified: Principal | ICD-10-CM | POA: Diagnosis present

## 2015-01-31 DIAGNOSIS — Z885 Allergy status to narcotic agent status: Secondary | ICD-10-CM | POA: Diagnosis not present

## 2015-01-31 DIAGNOSIS — I252 Old myocardial infarction: Secondary | ICD-10-CM | POA: Diagnosis not present

## 2015-01-31 DIAGNOSIS — I120 Hypertensive chronic kidney disease with stage 5 chronic kidney disease or end stage renal disease: Secondary | ICD-10-CM | POA: Diagnosis present

## 2015-01-31 DIAGNOSIS — I701 Atherosclerosis of renal artery: Secondary | ICD-10-CM | POA: Diagnosis present

## 2015-01-31 DIAGNOSIS — I10 Essential (primary) hypertension: Secondary | ICD-10-CM | POA: Diagnosis present

## 2015-01-31 DIAGNOSIS — E0590 Thyrotoxicosis, unspecified without thyrotoxic crisis or storm: Secondary | ICD-10-CM | POA: Diagnosis present

## 2015-01-31 DIAGNOSIS — G4733 Obstructive sleep apnea (adult) (pediatric): Secondary | ICD-10-CM | POA: Diagnosis present

## 2015-01-31 DIAGNOSIS — Z6841 Body Mass Index (BMI) 40.0 and over, adult: Secondary | ICD-10-CM

## 2015-01-31 DIAGNOSIS — I5033 Acute on chronic diastolic (congestive) heart failure: Secondary | ICD-10-CM | POA: Diagnosis present

## 2015-01-31 DIAGNOSIS — E669 Obesity, unspecified: Secondary | ICD-10-CM

## 2015-01-31 DIAGNOSIS — R4701 Aphasia: Secondary | ICD-10-CM | POA: Diagnosis present

## 2015-01-31 DIAGNOSIS — E119 Type 2 diabetes mellitus without complications: Secondary | ICD-10-CM | POA: Diagnosis present

## 2015-01-31 DIAGNOSIS — I160 Hypertensive urgency: Secondary | ICD-10-CM

## 2015-01-31 DIAGNOSIS — I63412 Cerebral infarction due to embolism of left middle cerebral artery: Secondary | ICD-10-CM | POA: Diagnosis not present

## 2015-01-31 DIAGNOSIS — Z888 Allergy status to other drugs, medicaments and biological substances status: Secondary | ICD-10-CM | POA: Diagnosis not present

## 2015-01-31 DIAGNOSIS — Z794 Long term (current) use of insulin: Secondary | ICD-10-CM | POA: Diagnosis not present

## 2015-01-31 DIAGNOSIS — J45909 Unspecified asthma, uncomplicated: Secondary | ICD-10-CM | POA: Diagnosis present

## 2015-01-31 DIAGNOSIS — Z992 Dependence on renal dialysis: Secondary | ICD-10-CM

## 2015-01-31 DIAGNOSIS — Z79899 Other long term (current) drug therapy: Secondary | ICD-10-CM

## 2015-01-31 DIAGNOSIS — I2510 Atherosclerotic heart disease of native coronary artery without angina pectoris: Secondary | ICD-10-CM | POA: Diagnosis present

## 2015-01-31 DIAGNOSIS — E1169 Type 2 diabetes mellitus with other specified complication: Secondary | ICD-10-CM

## 2015-01-31 HISTORY — DX: Heart failure, unspecified: I50.9

## 2015-01-31 LAB — URINALYSIS COMPLETE WITH MICROSCOPIC (ARMC ONLY)
BACTERIA UA: NONE SEEN
Bilirubin Urine: NEGATIVE
Glucose, UA: NEGATIVE mg/dL
Hgb urine dipstick: NEGATIVE
Ketones, ur: NEGATIVE mg/dL
Leukocytes, UA: NEGATIVE
Nitrite: NEGATIVE
PH: 5 (ref 5.0–8.0)
Protein, ur: 30 mg/dL — AB
SPECIFIC GRAVITY, URINE: 1.016 (ref 1.005–1.030)

## 2015-01-31 LAB — CBC WITH DIFFERENTIAL/PLATELET
Basophils Absolute: 0.1 10*3/uL (ref 0–0.1)
Basophils Relative: 1 %
EOS ABS: 0.4 10*3/uL (ref 0–0.7)
EOS PCT: 5 %
HCT: 33.5 % — ABNORMAL LOW (ref 35.0–47.0)
Hemoglobin: 11.2 g/dL — ABNORMAL LOW (ref 12.0–16.0)
Lymphocytes Relative: 23 %
Lymphs Abs: 1.8 10*3/uL (ref 1.0–3.6)
MCH: 32.8 pg (ref 26.0–34.0)
MCHC: 33.4 g/dL (ref 32.0–36.0)
MCV: 98.2 fL (ref 80.0–100.0)
MONOS PCT: 9 %
Monocytes Absolute: 0.7 10*3/uL (ref 0.2–0.9)
NEUTROS ABS: 4.8 10*3/uL (ref 1.4–6.5)
Neutrophils Relative %: 62 %
PLATELETS: 142 10*3/uL — AB (ref 150–440)
RBC: 3.41 MIL/uL — AB (ref 3.80–5.20)
RDW: 16.2 % — AB (ref 11.5–14.5)
WBC: 7.8 10*3/uL (ref 3.6–11.0)

## 2015-01-31 LAB — COMPREHENSIVE METABOLIC PANEL
ALBUMIN: 3.4 g/dL — AB (ref 3.5–5.0)
ALK PHOS: 74 U/L (ref 38–126)
ALT: 11 U/L — AB (ref 14–54)
ANION GAP: 7 (ref 5–15)
AST: 17 U/L (ref 15–41)
BILIRUBIN TOTAL: 0.4 mg/dL (ref 0.3–1.2)
BUN: 27 mg/dL — AB (ref 6–20)
CALCIUM: 8.8 mg/dL — AB (ref 8.9–10.3)
CO2: 26 mmol/L (ref 22–32)
Chloride: 102 mmol/L (ref 101–111)
Creatinine, Ser: 2.56 mg/dL — ABNORMAL HIGH (ref 0.44–1.00)
GFR calc Af Amer: 20 mL/min — ABNORMAL LOW (ref >60)
GFR calc non Af Amer: 17 mL/min — ABNORMAL LOW (ref >60)
Glucose, Bld: 121 mg/dL — ABNORMAL HIGH (ref 65–99)
POTASSIUM: 4.3 mmol/L (ref 3.5–5.1)
SODIUM: 135 mmol/L (ref 135–145)
TOTAL PROTEIN: 6.2 g/dL — AB (ref 6.5–8.1)

## 2015-01-31 LAB — TROPONIN I: Troponin I: <0.03 ng/mL (ref <0.031)

## 2015-01-31 MED ORDER — LORAZEPAM 2 MG/ML IJ SOLN
0.5000 mg | Freq: Once | INTRAMUSCULAR | Status: AC
  Administered 2015-01-31: 0.5 mg via INTRAVENOUS

## 2015-01-31 MED ORDER — LABETALOL HCL 5 MG/ML IV SOLN
10.0000 mg | Freq: Once | INTRAVENOUS | Status: AC
  Administered 2015-01-31: 10 mg via INTRAVENOUS

## 2015-01-31 MED ORDER — INSULIN ASPART 100 UNIT/ML ~~LOC~~ SOLN
0.0000 [IU] | Freq: Three times a day (TID) | SUBCUTANEOUS | Status: DC
  Administered 2015-02-01: 1 [IU] via SUBCUTANEOUS
  Administered 2015-02-02: 2 [IU] via SUBCUTANEOUS
  Administered 2015-02-03: 1 [IU] via SUBCUTANEOUS
  Filled 2015-01-31 (×2): qty 1
  Filled 2015-01-31: qty 2

## 2015-01-31 MED ORDER — TRIAMCINOLONE ACETONIDE 0.5 % EX CREA
1.0000 "application " | TOPICAL_CREAM | Freq: Every day | CUTANEOUS | Status: DC | PRN
  Filled 2015-01-31: qty 15

## 2015-01-31 MED ORDER — INSULIN ASPART 100 UNIT/ML ~~LOC~~ SOLN: 0.0000 [IU] | Freq: Every day | SUBCUTANEOUS | Status: DC

## 2015-01-31 MED ORDER — ROSUVASTATIN CALCIUM 20 MG PO TABS
40.0000 mg | ORAL_TABLET | Freq: Every day | ORAL | Status: DC
  Administered 2015-02-01 – 2015-02-02 (×2): 40 mg via ORAL
  Filled 2015-01-31 (×4): qty 1
  Filled 2015-01-31: qty 2

## 2015-01-31 MED ORDER — ACETAMINOPHEN 325 MG PO TABS: 650.0000 mg | ORAL_TABLET | ORAL | Status: DC | PRN

## 2015-01-31 MED ORDER — LACTULOSE 10 GM/15ML PO SOLN: 30.0000 g | Freq: Every day | ORAL | Status: DC | PRN

## 2015-01-31 MED ORDER — TRAZODONE HCL 50 MG PO TABS
50.0000 mg | ORAL_TABLET | Freq: Every day | ORAL | Status: DC
  Administered 2015-02-01 – 2015-02-02 (×2): 50 mg via ORAL
  Filled 2015-01-31 (×3): qty 1

## 2015-01-31 MED ORDER — CARVEDILOL 25 MG PO TABS
25.0000 mg | ORAL_TABLET | Freq: Two times a day (BID) | ORAL | Status: DC
  Administered 2015-02-01 – 2015-02-03 (×4): 25 mg via ORAL
  Filled 2015-01-31 (×4): qty 1

## 2015-01-31 MED ORDER — ACETAMINOPHEN 650 MG RE SUPP: 650.0000 mg | RECTAL | Status: DC | PRN

## 2015-01-31 MED ORDER — LABETALOL HCL 5 MG/ML IV SOLN
INTRAVENOUS | Status: AC
  Administered 2015-01-31: 10 mg via INTRAVENOUS
  Filled 2015-01-31: qty 4

## 2015-01-31 MED ORDER — FUROSEMIDE 40 MG PO TABS
40.0000 mg | ORAL_TABLET | Freq: Every day | ORAL | Status: DC
  Administered 2015-02-03: 40 mg via ORAL
  Filled 2015-01-31 (×2): qty 1

## 2015-01-31 MED ORDER — HYDRALAZINE HCL 25 MG PO TABS
25.0000 mg | ORAL_TABLET | Freq: Three times a day (TID) | ORAL | Status: DC
  Administered 2015-02-01 – 2015-02-03 (×6): 25 mg via ORAL
  Filled 2015-01-31 (×7): qty 1

## 2015-01-31 MED ORDER — SENNOSIDES-DOCUSATE SODIUM 8.6-50 MG PO TABS: 1.0000 | ORAL_TABLET | Freq: Every evening | ORAL | Status: DC | PRN

## 2015-01-31 MED ORDER — ASPIRIN EC 81 MG PO TBEC: 81.0000 mg | DELAYED_RELEASE_TABLET | Freq: Every day | ORAL | Status: DC

## 2015-01-31 MED ORDER — FLUOXETINE HCL 10 MG PO CAPS
10.0000 mg | ORAL_CAPSULE | Freq: Every day | ORAL | Status: DC
  Filled 2015-01-31: qty 1

## 2015-01-31 MED ORDER — HEPARIN SODIUM (PORCINE) 5000 UNIT/ML IJ SOLN
5000.0000 [IU] | Freq: Three times a day (TID) | INTRAMUSCULAR | Status: DC
  Administered 2015-01-31 – 2015-02-03 (×9): 5000 [IU] via SUBCUTANEOUS
  Filled 2015-01-31 (×9): qty 1

## 2015-01-31 MED ORDER — NYSTATIN 100000 UNIT/GM EX POWD
Freq: Two times a day (BID) | CUTANEOUS | Status: DC
  Administered 2015-01-31 – 2015-02-03 (×4): via TOPICAL
  Filled 2015-01-31: qty 15

## 2015-01-31 MED ORDER — STROKE: EARLY STAGES OF RECOVERY BOOK
Freq: Once | Status: AC
  Administered 2015-01-31

## 2015-01-31 MED ORDER — LABETALOL HCL 5 MG/ML IV SOLN: 10.0000 mg | INTRAVENOUS | Status: DC | PRN

## 2015-01-31 MED ORDER — INSULIN GLARGINE 100 UNIT/ML ~~LOC~~ SOLN
38.0000 [IU] | Freq: Every day | SUBCUTANEOUS | Status: DC
  Filled 2015-01-31 (×4): qty 0.38

## 2015-01-31 MED ORDER — LEVOTHYROXINE SODIUM 75 MCG PO TABS
75.0000 ug | ORAL_TABLET | Freq: Every day | ORAL | Status: DC
Start: 2015-02-01 — End: 2015-02-03
  Administered 2015-02-03: 75 ug via ORAL
  Filled 2015-01-31: qty 1

## 2015-01-31 MED ORDER — INSULIN ASPART 100 UNIT/ML ~~LOC~~ SOLN
6.0000 [IU] | Freq: Three times a day (TID) | SUBCUTANEOUS | Status: DC
  Administered 2015-02-01 – 2015-02-03 (×5): 6 [IU] via SUBCUTANEOUS
  Filled 2015-01-31 (×5): qty 6

## 2015-01-31 MED ORDER — LORAZEPAM 2 MG/ML IJ SOLN
INTRAMUSCULAR | Status: AC
  Administered 2015-01-31: 0.5 mg via INTRAVENOUS
  Filled 2015-01-31: qty 1

## 2015-01-31 MED ORDER — FLUOXETINE HCL 10 MG PO CAPS
10.0000 mg | ORAL_CAPSULE | Freq: Every day | ORAL | Status: DC
  Administered 2015-02-03: 10 mg via ORAL
  Filled 2015-01-31: qty 1

## 2015-01-31 NOTE — ED Notes (Signed)
Pink sleeve applied to left arm where fistula is.

## 2015-01-31 NOTE — ED Notes (Signed)
MD at bedside. 

## 2015-01-31 NOTE — ED Provider Notes (Signed)
[facility] Emergency Department Provider Note  ____________________________________________  Time seen: 1830  I have reviewed the triage vital signs and the nursing notes.   HISTORY  Chief Complaint Aphasia     HPI Dawn Bradford is a 79 y.o. female who has end-stage renal disease and goes to dialysis. On occasion she may miss a word or 2 and has some difficulty with her speech but not much. She now has an acute change that began slowly on Thursday and Friday. Her speech is become more limited. She feels she cannot communicate well. This clearly frustrates her. The frustration appears to make the difficulty with speech worse.  She denies any weakness in her hands feet or legs. She follows commands well and moves all 4 extremities. She denies any headache or fever. She has been going to dialysis. She has a fistula in her left arm which is relatively new. They were able to use the fistula through the week. She also has a Vas-Cath in her right chest.    Past Medical History  Diagnosis Date  . Hyperlipidemia   . Hypertension     resistant hyptertension times many years. the patient does have renal artery stenosis. she has tried calcium channel blockers in the past and states that she would not take them now because she had some problems with her gums which her dentist identified as calcium-channel blocker side effects.  . Diabetes mellitus     type 2  . Coronary artery disease     s/p anterior MI in 1996 followed by CABG. Lexiscan myoview (4/11): EF 67%, normal perfusion with no evidence for ischemia or infarction.   . Renal artery stenosis     The patient has an occluded right renal artery and an atrophic right kidney. There is 20% left renal artery stenosis. This was seen by catheterization in 2007  . Diastolic heart failure     most recent echo (12/10) showed EF 55-60% with mild LVH, grade I diastolic dysfunction, mild LAE.  [name] Aortic valve disorder     Echo  in 2009 showed mean aortic valve gradient of 13 mmHg, suggesting very mild stenosis. Echo (12/10) suggested aortic sclerosis only  . Obesity   . Chronic kidney disease     most recent creatinine was 1.2  . Obstructive sleep apnea     PSG 01/05/2006 AHI 24.8, CPAP 9cm H2O  . Mild asthma     PFT 02/05/10 FEV1 1.38, FEV1% 73, TLC 3.78 (86%), DLCO 48%, +BD  . ACE-inhibitor cough   . Hypothyroidism   . Obesity hypoventilation syndrome        . Pulmonary nodule, right   . Carotid artery disease     mild, carotid dopplers 12/2009  . Myocardial infarction     Patient Active Problem List   Diagnosis Date Noted  . Mild intermittent asthma 03/29/2010  . CAD, ARTERY BYPASS GRAFT 03/02/2010  . Obesity hypoventilation syndrome 02/05/2010  . Shortness of breath 12/24/2009  . CAROTID BRUIT 12/14/2009  . DIASTOLIC HEART FAILURE, CHRONIC 02/17/2009  . DIABETES MELLITUS, TYPE II 01/29/2009  . Hyperlipidemia 01/29/2009  . SLEEP APNEA, OBSTRUCTIVE 01/29/2009  . Essential hypertension 01/29/2009  . Aortic valve disorder 01/29/2009  . RENAL ARTERY STENOSIS 01/29/2009  . RENAL DISEASE, CHRONIC 01/29/2009    Past Surgical History  Procedure Laterality Date  . Tubal ligation  1973  . Inner ear surgery  1979    right  . Abdominal hysterectomy  1985  . Coronary  artery bypass graft  1996  . Cataract extraction  2010  . Av fistula placement    . Dialysis fistula creation    . Peripheral vascular catheterization Left 01/06/2015    Procedure: A/V Shuntogram/Fistulagram;  Surgeon: Katha Cabal, MD;  Location: [facility];  Service: Cardiovascular;  Laterality: Left;    Current Outpatient Rx  Name  Route  Sig  Dispense  Refill  . 5-Hydroxytryptophan (5-HTP PO)   Oral   Take by mouth. As directed           . aspirin 81 MG EC tablet   Oral   Take 81 mg by mouth daily.           . carvedilol (COREG) 25 MG tablet      TAKE ONE TABLET BY MOUTH TWICE DAILY WITH MEALS   180 tablet    3   . Cholecalciferol (D 5000 PO)   Oral   Take by mouth as directed.           [name] FLUoxetine (PROZAC) 10 MG tablet   Oral   Take 10 mg by mouth daily.         . furosemide (LASIX) 40 MG tablet   Oral   Take 40 mg by mouth. Takes 1 tablet on Tuesday, Thursday, Saturday and Sundays.         [name] HUMALOG KWIKPEN 100 UNIT/ML SOPN   Subcutaneous   Inject 10 Units into the skin 2 (two) times daily before a meal.         . hydrALAZINE (APRESOLINE) 25 MG tablet   Oral   Take 25 mg by mouth 3 (three) times daily.           . insulin glargine (LANTUS) 100 UNIT/ML injection   Subcutaneous   Inject 38 Units into the skin at bedtime.          [name] LACTULOSE PO   Oral   Take by mouth as needed.         [name] levothyroxine (SYNTHROID, LEVOTHROID) 75 MCG tablet   Oral   Take 75 mcg by mouth daily before breakfast.         . MICARDIS 80 MG tablet      TAKE ONE TABLET BY MOUTH EVERY DAY   30 each   5   . nystatin (MYCOSTATIN/NYSTOP) 100000 UNIT/GM POWD   Topical   Apply topically as needed.         . ondansetron (ZOFRAN) 8 MG tablet   Oral   Take 8 mg by mouth as needed for nausea or vomiting.         . rosuvastatin (CRESTOR) 40 MG tablet   Oral   Take 40 mg by mouth daily.           . traZODone (DESYREL) 50 MG tablet   Oral   Take 50 mg by mouth daily as needed.           . triamcinolone (KENALOG) 0.5 % cream      as needed.           Allergies Ace inhibitors; Amlodipine besylate; Clonidine hydrochloride; Codeine; and Metformin  Family History  Problem Relation Age of Onset  . Kidney failure Mother   . Diabetes Mother   . Aortic aneurysm Father   . Coronary artery disease Father   . Heart attack Father   . Diabetes Sister   . Diabetes Brother   . Kidney failure Brother  Social History History  Substance Use Topics  . Smoking status: Never Smoker   . Smokeless tobacco: Never Used  . Alcohol Use: No    Review of  Systems  Constitutional: Negative for fever. ENT: Negative for sore throat. Cardiovascular: Negative for chest pain. Respiratory: Negative for shortness of breath. Gastrointestinal: Negative for abdominal pain, vomiting and diarrhea. Genitourinary: Patient does make small amounts of urine still. She does have renal disease and is on dialysis. Musculoskeletal: Negative for back pain. Skin: Negative for rash. Neurological: Worrisome for her difficulty with speech and communication.   10-point ROS otherwise negative.  ____________________________________________   PHYSICAL EXAM:  VITAL SIGNS: ED Triage Vitals  Enc Vitals Group     BP 01/31/15 1744 202/69 mmHg     Pulse Rate 01/31/15 1744 70     Resp 01/31/15 1744 20     Temp 01/31/15 1744 99 F (37.2 C)     Temp Source 01/31/15 1744 Oral     SpO2 01/31/15 1744 95 %     Weight 01/31/15 1744 250 lb (113.399 kg)     Height 01/31/15 1744 5\' 2"  (1.575 m)     Head Cir --      Peak Flow --      Pain Score --      Pain Loc --      Pain Edu? --      Excl. in [place]? --     Constitutional:  Alert. She is able to speak with me. She phrases. His, but then begins to have difficulty with word selection and appears frustrated. She then begins to speak less. With reassurance and after a moment, she is able to resume her speech. ENT   Head: Normocephalic and atraumatic.   Nose: No congestion/rhinnorhea.   Mouth/Throat: Mucous membranes are moist. Cardiovascular: Normal rate, regular rhythm. Respiratory: Normal respiratory effort without tachypnea. Breath sounds are clear and equal bilaterally. No wheezes/rales/rhonchi. Gastrointestinal: Soft and nontender. No distention.  Back: No muscle spasm, no tenderness, no CVA tenderness. Musculoskeletal: Nontender with normal range of motion in all extremities.  No noted edema. Neurologic:  Some limitation with speech and communication as noted above, but no distinct motor dysfunction. She  has 5 or 5 strength in all 4 extremities and moves them well spontaneously. She follows commands. Cranial nerves II through XII are intact.  Skin:  Skin is warm, dry. No rash noted. Psychiatric: Patient appears unsettled by her difficulty with speech. She appears a little bit anxious. I believe this makes her speech pattern even worse. ____________________________________________    LABS (pertinent positives/negatives)  Urine is negative for infection Hemoglobin of low 0.2 white blood cell count 7.8 Metabolic panel shows BUN of 27 creatinine of 2.56. Potassium of 4.3. Troponin is negative.  ____________________________________________   EKG ED ECG REPORT I, Declin Rajan W, the attending physician, personally viewed and interpreted this ECG.   Date: 01/31/2015  EKG Time: 1941  Rate: 65  Rhythm:  Normal sinus rhythm  Axis:  Normal  Intervals:  Normal  ST&T Change:  None   ____________________________________________    RADIOLOGY  CT head: FINDINGS: Bony calvarium is intact. Mild atrophic changes are noted commensurate with patient's given age. No findings to suggest acute hemorrhage, acute infarction or space-occupying mass lesion are noted. Stable lacunar infarct is noted in the left basal ganglia.  IMPRESSION: No acute abnormality noted.  ____________________________________________   INITIAL IMPRESSION / ASSESSMENT AND PLAN / ED COURSE  It is not clear if this patient's  difficulty with speech is organic, ischemic, or whether it may be stress induced due to anxiety. Being neurologically intact otherwise is overall reassuring. The ability to choose most any word and her success with motor function of her mouth and tongue is also another good sign. We are waiting for the results on her head CT. We will evaluate her electrolytes and other blood tests. I have discussed with her treating her with an anti-anxiety medicine. We will give her Ativan 0.5 mg  IV.  ----------------------------------------- 8:13 PM on 01/31/2015 -----------------------------------------  After the Ativan 0.5 mg IVP patient does not have any improvement. The nurse did a CVA swallowing study on her which she failed. Her head CT was normal however she is hypertensive currently. We will treat her with labetalol IV 10 mg repeat the dose 1 as needed. Given her ongoing symptoms and her risk factors, including diabetes and end-stage renal disease, we will ask the hospitalist to evaluate her for admission to the hospital. The patient would benefit from an MRI given her ongoing symptoms area  ____________________________________________   FINAL CLINICAL IMPRESSION(S) / ED DIAGNOSES  Final diagnoses:  Hypertensive urgency  Dysphasia      Ahmed Prima, MD 01/31/15 2020

## 2015-01-31 NOTE — ED Notes (Signed)
Patient denies pain and is resting comfortably.  

## 2015-01-31 NOTE — H&P (Addendum)
[place] [place] NAME: Dawn Bradford    MR#:  [id number]  DATE OF BIRTH:  05-01-1936  DATE OF ADMISSION:  01/31/2015  PRIMARY CARE PHYSICIAN: Hortencia Pilar, MD   REQUESTING/REFERRING PHYSICIAN: Dr. Thomasene Lot  CHIEF COMPLAINT:   Chief Complaint  Patient presents with  . Aphasia    difficulty speaking x 2 days, dialysis yesterday    HISTORY OF PRESENT ILLNESS:  Dawn Bradford  is a 79 y.o. female with a known history of coronary artery disease status post bypass grafting, end-stage renal disease on hemodialysis, diabetes mellitus type 2 and obesity presents with 3 days of word finding difficulty. History is provided mostly by her daughters as the patient is frustrated when attempting to speak. She called one of her daughters yesterday morning to tell her about a television show but was unable to speak clearly. Her daughter saw her again that afternoon and her speech had improved considerably. Last night she was unable to form complete sentences after dialysis, got very frustrated and went to bed. This morning she called her daughter crying stating that she needed help but was unable to say what she needed. When her daughter arrived she found that her mother was struggling to talk. She denies headache, vision change, palpitations, chest pain, focal numbness or weakness or tingling. The family reports that the patient does not seem confused rather she seems to know what she wants to say but is unable to say it. This is a major change for her she is usually very articulate. CT scan is negative. She is being admitted for further evaluation and treatment of aphasia.  PAST MEDICAL HISTORY:   Past Medical History  Diagnosis Date  . Hyperlipidemia   . Hypertension     resistant hyptertension times many years. the patient does have renal artery stenosis. she has tried calcium channel blockers in the past and states that she would not take them now  because she had some problems with her gums which her dentist identified as calcium-channel blocker side effects.  . Diabetes mellitus     type 2  . Coronary artery disease     s/p anterior MI in 1996 followed by CABG. Lexiscan myoview (4/11): EF 67%, normal perfusion with no evidence for ischemia or infarction.   . Renal artery stenosis     The patient has an occluded right renal artery and an atrophic right kidney. There is 20% left renal artery stenosis. This was seen by catheterization in 2007  . Diastolic heart failure     most recent echo (12/10) showed EF 55-60% with mild LVH, grade I diastolic dysfunction, mild LAE.  [name] Aortic valve disorder     Echo in 2009 showed mean aortic valve gradient of 13 mmHg, suggesting very mild stenosis. Echo (12/10) suggested aortic sclerosis only  . Obesity   . Chronic kidney disease     most recent creatinine was 1.2  . Obstructive sleep apnea     PSG 01/05/2006 AHI 24.8, CPAP 9cm H2O  . Mild asthma     PFT 02/05/10 FEV1 1.38, FEV1% 73, TLC 3.78 (86%), DLCO 48%, +BD  . ACE-inhibitor cough   . Hypothyroidism   . Obesity hypoventilation syndrome        . Pulmonary nodule, right   . Carotid artery disease     mild, carotid dopplers 12/2009  . Myocardial infarction     PAST SURGICAL HISTORY:   Past Surgical History  Procedure  Laterality Date  . Tubal ligation  1973  . Inner ear surgery  1979    right  . Abdominal hysterectomy  1985  . Coronary artery bypass graft  1996  . Cataract extraction  2010  . Av fistula placement    . Dialysis fistula creation    . Peripheral vascular catheterization Left 01/06/2015    Procedure: A/V Shuntogram/Fistulagram;  Surgeon: Katha Cabal, MD;  Location: [facility];  Service: Cardiovascular;  Laterality: Left;    SOCIAL HISTORY:   History  Substance Use Topics  . Smoking status: Never Smoker   . Smokeless tobacco: Never Used  . Alcohol Use: No    FAMILY HISTORY:   Family History   Problem Relation Age of Onset  . Kidney failure Mother   . Diabetes Mother   . Aortic aneurysm Father   . Coronary artery disease Father   . Heart attack Father   . Diabetes Sister   . Diabetes Brother   . Kidney failure Brother     DRUG ALLERGIES:   Allergies  Allergen Reactions  . Ace Inhibitors     unknown  . Amlodipine Besylate     "something happened to gums"  . Clonidine Hydrochloride Other (See Comments)    Unknown reaction.  . Codeine Hives  . Metformin     REACTION: vomiting    REVIEW OF SYSTEMS:   Review of Systems  Constitutional: Negative for fever, chills, weight loss, malaise/fatigue and diaphoresis.  Eyes: Negative for blurred vision, double vision, photophobia, pain, discharge and redness.  Respiratory: Negative for cough, sputum production and shortness of breath.   Cardiovascular: Negative for chest pain, palpitations, orthopnea, claudication, leg swelling and PND.  Gastrointestinal: Negative for heartburn, nausea, vomiting, abdominal pain, diarrhea and constipation.  Genitourinary: Negative for dysuria and frequency.  Musculoskeletal: Negative for myalgias.  Skin: Negative for rash.  Neurological: Positive for speech change. Negative for dizziness, tingling, tremors, sensory change, focal weakness, loss of consciousness, weakness and headaches.  Endo/Heme/Allergies: Does not bruise/bleed easily.  Psychiatric/Behavioral: Negative for depression. The patient is nervous/anxious.    MEDICATIONS AT HOME:   Prior to Admission medications   Medication Sig Start Date End Date Taking? Authorizing Provider  5-Hydroxytryptophan (5-HTP PO) Take by mouth. As directed      Historical Provider, MD  aspirin 81 MG EC tablet Take 81 mg by mouth daily.      Historical Provider, MD  carvedilol (COREG) 25 MG tablet TAKE ONE TABLET BY MOUTH TWICE DAILY WITH MEALS 11/06/13   Wellington Hampshire, MD  Cholecalciferol (D 5000 PO) Take by mouth as directed.      Historical  Provider, MD  FLUoxetine (PROZAC) 10 MG tablet Take 10 mg by mouth daily.    Historical Provider, MD  furosemide (LASIX) 40 MG tablet Take 40 mg by mouth. Takes 1 tablet on Tuesday, Thursday, Saturday and Sundays. 02/07/11   Larey Dresser, MD  HUMALOG KWIKPEN 100 UNIT/ML SOPN Inject 10 Units into the skin 2 (two) times daily before a meal. 05/15/13   Historical Provider, MD  hydrALAZINE (APRESOLINE) 25 MG tablet Take 25 mg by mouth 3 (three) times daily.      Historical Provider, MD  insulin glargine (LANTUS) 100 UNIT/ML injection Inject 38 Units into the skin at bedtime.     Historical Provider, MD  LACTULOSE PO Take by mouth as needed.    Historical Provider, MD  levothyroxine (SYNTHROID, LEVOTHROID) 75 MCG tablet Take 75 mcg by mouth  daily before breakfast.    Historical Provider, MD  MICARDIS 80 MG tablet TAKE ONE TABLET BY MOUTH EVERY DAY 08/07/11   Larey Dresser, MD  nystatin (MYCOSTATIN/NYSTOP) 100000 UNIT/GM POWD Apply topically as needed.    Historical Provider, MD  ondansetron (ZOFRAN) 8 MG tablet Take 8 mg by mouth as needed for nausea or vomiting.    Historical Provider, MD  rosuvastatin (CRESTOR) 40 MG tablet Take 40 mg by mouth daily.      Historical Provider, MD  traZODone (DESYREL) 50 MG tablet Take 50 mg by mouth daily as needed.      Historical Provider, MD  triamcinolone (KENALOG) 0.5 % cream as needed. 03/09/11   Historical Provider, MD      VITAL SIGNS:  Blood pressure 202/102, pulse 63, temperature 98.7 F (37.1 C), temperature source Oral, resp. rate 24, height 5\' 2"  (1.575 m), weight 113.399 kg (250 lb), SpO2 96 %.  PHYSICAL EXAMINATION:  GENERAL:  79 y.o.-year-old patient lying in the bed with no acute distress. Obese  EYES: Pupils equal, round, reactive to light and accommodation. No scleral icterus. Extraocular muscles intact.  HEENT: Head atraumatic, normocephalic. Oropharynx and nasopharynx clear. Moist mucous membranes, fair dentition  NECK:  Supple, no jugular  venous distention. No thyroid enlargement, no tenderness.  LUNGS: Normal breath sounds bilaterally, no wheezing, rales,rhonchi or crepitation. No use of accessory muscles of respiration.  CARDIOVASCULAR: S1, S2 normal.  3/6 systolic ejection murmur no rubs, or gallops.  ABDOMEN: Soft, nontender, nondistended. Bowel sounds present. No organomegaly or mass.  EXTREMITIES: No pedal edema, cyanosis, or clubbing.  NEUROLOGIC: Cranial nerves II through XII are intact. Muscle strength 5/5 in all extremities. Sensation intact.  cerebellar examination is normal. She seems very frustrated when trying to talk. She is able to make complete sentences but seems to be pausing between words  PSYCHIATRIC: The patient is alert, she does not know the date  SKIN: No obvious rash, lesion, or ulcer. Port-A-Cath in the right chest, no surrounding erythema drainage or irritation. There is a fistula in the left wrist good thrill some bruising no tenderness  LABORATORY PANEL:   CBC  Recent Labs Lab 01/31/15 1857  WBC 7.8  HGB 11.2*  HCT 33.5*  PLT 142*   ------------------------------------------------------------------------------------------------------------------  Chemistries   Recent Labs Lab 01/31/15 1857  NA 135  K 4.3  CL 102  CO2 26  GLUCOSE 121*  BUN 27*  CREATININE 2.56*  CALCIUM 8.8*  AST 17  ALT 11*  ALKPHOS 74  BILITOT 0.4   ------------------------------------------------------------------------------------------------------------------  Cardiac Enzymes  Recent Labs Lab 01/31/15 1857  TROPONINI <0.03   ------------------------------------------------------------------------------------------------------------------  RADIOLOGY:  Ct Head Wo Contrast  01/31/2015   CLINICAL DATA:  Expressive aphasia  EXAM: CT HEAD WITHOUT CONTRAST  TECHNIQUE: Contiguous axial images were obtained from the base of the skull through the vertex without intravenous contrast.  COMPARISON:  10/27/2010   FINDINGS: Bony calvarium is intact. Mild atrophic changes are noted commensurate with patient's given age. No findings to suggest acute hemorrhage, acute infarction or space-occupying mass lesion are noted. Stable lacunar infarct is noted in the left basal ganglia.  IMPRESSION: No acute abnormality noted.   Electronically Signed   By: Inez Catalina M.D.   On: 01/31/2015 18:07    EKG:   Orders placed or performed during the hospital encounter of 01/31/15  . ED EKG  . ED EKG    IMPRESSION AND PLAN:   Principal Problem:   CVA (cerebral infarction)  Active Problems:   Essential hypertension   DIASTOLIC HEART FAILURE, CHRONIC   Diabetes mellitus type 2 in obese   End stage renal disease   Problem #1 aphasia: I am concerned that she has had a CVA. CT is negative. I have ordered an MRI. Also ordered carotid Dopplers and 2-D echocardiogram. Will order speech and physical therapy consult. We will hold off on neurology consultation for now. No changes on EKG. Monitor on telemetry for potential atrial fibrillation. She is very hypertensive on presentation, this may also be hypertensive urgency.  #2 end-stage renal disease on hemodialysis: Nephrology consultation. She has not missed any treatments but will need dialysis tomorrow.  #3 diabetes mellitus type 2: Check a hemoglobin A1c. Continue Lantus 38 units, 10 units of NovoLog with meals, sliding scale  #4 hypertension: Chronic hypertension on hydralazine, carvedilol, Lasix at home. Currently very hypertensive with initial blood pressures of 214/100. Will write for when necessary labetalol. Continue current medications. Continue dialysis. Consider adding amlodipine. Presenting symptoms may be due to hypertensive urgency. Blood pressure at the time of admission is down to 167/57. It looks like she was taking Micardis but is no longer taking that medication which may explain her current hypertension  #5 chronic diastolic heart failure: No signs of  exacerbation at this time. Continue dialysis for fluid management, carvedilol, Lasix. Echocardiogram pending, last is listed as 2010  All the records are reviewed and case discussed with ED provider. Management plans discussed with the patient, family and they are in agreement.  CODE STATUS: Full  TOTAL TIME TAKING CARE OF THIS PATIENT: 50 minutes.    Myrtis Ser M.D on 01/31/2015 at 9:37 PM  Between 7am to 6pm - Pager - [phone]  After 6pm go to [email or web address] - password [email or web address]  Office  [phone]  CC: Primary care physician; Hortencia Pilar, MD

## 2015-01-31 NOTE — ED Notes (Signed)
Vas cath intact to right upper chest.

## 2015-01-31 NOTE — ED Notes (Signed)
Family at bedside. 

## 2015-02-01 ENCOUNTER — Encounter: Payer: Self-pay | Admitting: Radiology

## 2015-02-01 ENCOUNTER — Inpatient Hospital Stay: Payer: Medicare Other

## 2015-02-01 ENCOUNTER — Inpatient Hospital Stay (HOSPITAL_COMMUNITY)
Admit: 2015-02-01 | Discharge: 2015-02-01 | Disposition: A | Discharge status: Home / Self Care | Payer: Medicare Other | Attending: Internal Medicine | Admitting: Internal Medicine

## 2015-02-01 DIAGNOSIS — I10 Essential (primary) hypertension: Secondary | ICD-10-CM

## 2015-02-01 DIAGNOSIS — I639 Cerebral infarction, unspecified: Secondary | ICD-10-CM

## 2015-02-01 DIAGNOSIS — I63412 Cerebral infarction due to embolism of left middle cerebral artery: Secondary | ICD-10-CM

## 2015-02-01 LAB — GLUCOSE, CAPILLARY
GLUCOSE-CAPILLARY: 118 mg/dL — AB (ref 65–99)
GLUCOSE-CAPILLARY: 142 mg/dL — AB (ref 65–99)
Glucose-Capillary: 88 mg/dL (ref 65–99)
Glucose-Capillary: 128 mg/dL — ABNORMAL HIGH (ref 65–99)

## 2015-02-01 LAB — LIPID PANEL
Cholesterol: 122 mg/dL (ref 0–200)
HDL: 43 mg/dL (ref >40)
LDL Cholesterol: 65 mg/dL (ref 0–99)
Total CHOL/HDL Ratio: 2.8 RATIO
Triglycerides: 68 mg/dL (ref <150)
VLDL: 14 mg/dL (ref 0–40)

## 2015-02-01 LAB — HEMOGLOBIN A1C: HEMOGLOBIN A1C: 6.1 % — AB (ref 4.0–6.0)

## 2015-02-01 MED ORDER — ASPIRIN 325 MG PO TABS
325.0000 mg | ORAL_TABLET | Freq: Every day | ORAL | Status: DC
  Filled 2015-02-01: qty 1

## 2015-02-01 MED ORDER — IOHEXOL 350 MG/ML SOLN: 75.0000 mL | Freq: Once | INTRAVENOUS | Status: AC | PRN

## 2015-02-01 NOTE — Evaluation (Signed)
Clinical/Bedside Swallow Evaluation Patient Details  Name: Dawn Bradford MRN: [id number] Date of Birth: 12/10/1935  Today's Date: 02/01/2015 Time: SLP Start Time (ACUTE ONLY): 53 SLP Stop Time (ACUTE ONLY): 1630 SLP Time Calculation (min) (ACUTE ONLY): 60 min  Past Medical History:  Past Medical History  Diagnosis Date  . Hyperlipidemia   . Hypertension     resistant hyptertension times many years. the patient does have renal artery stenosis. she has tried calcium channel blockers in the past and states that she would not take them now because she had some problems with her gums which her dentist identified as calcium-channel blocker side effects.  . Diabetes mellitus     type 2  . Coronary artery disease     s/p anterior MI in 1996 followed by CABG. Lexiscan myoview (4/11): EF 67%, normal perfusion with no evidence for ischemia or infarction.   . Renal artery stenosis     The patient has an occluded right renal artery and an atrophic right kidney. There is 20% left renal artery stenosis. This was seen by catheterization in 2007  . Diastolic heart failure     most recent echo (12/10) showed EF 55-60% with mild LVH, grade I diastolic dysfunction, mild LAE.  [name] Aortic valve disorder     Echo in 2009 showed mean aortic valve gradient of 13 mmHg, suggesting very mild stenosis. Echo (12/10) suggested aortic sclerosis only  . Obesity   . Chronic kidney disease     most recent creatinine was 1.2  . Obstructive sleep apnea     PSG 01/05/2006 AHI 24.8, CPAP 9cm H2O  . Mild asthma     PFT 02/05/10 FEV1 1.38, FEV1% 73, TLC 3.78 (86%), DLCO 48%, +BD  . ACE-inhibitor cough   . Hypothyroidism   . Obesity hypoventilation syndrome        . Pulmonary nodule, right   . Carotid artery disease     mild, carotid dopplers 12/2009  . Myocardial infarction   . CHF (congestive heart failure)   . Renal insufficiency    Past Surgical History:  Past Surgical History  Procedure Laterality Date  . Tubal  ligation  1973  . Inner ear surgery  1979    right  . Abdominal hysterectomy  1985  . Coronary artery bypass graft  1996  . Cataract extraction  2010  . Av fistula placement    . Dialysis fistula creation    . Peripheral vascular catheterization Left 01/06/2015    Procedure: A/V Shuntogram/Fistulagram;  Surgeon: Katha Cabal, MD;  Location: [facility];  Service: Cardiovascular;  Laterality: Left;   HPI:  Pt and family reported expressive language deficits for ~2-3 days prior to admission. Pt and family denied any noted difficulties swallowing during this time; no noted coughing or choking. Pt did acknowledge pills could be difficult to swallow.   Assessment / Plan / Recommendation Clinical Impression  Pt appeared to present w/ adequate oropharyngeal swallow abilities to safely tolerate trials of thin liquids and solids following aspiration precautions. No immeidate, overt s/s of aspiration were noted during these trials. Pt demo. an inconsistent, mild throat clearing initially w/ trials of ice chips x2, but stated she was "dry"; this occurence did not increase as po trials increased. No changes were noted in her respiratory status during/post oral intake. Pt demo. no significant oral phase deficits w/ trials given; she cleared orally between boluses. Pt fed self w/ setup assistance. No oral motor weakness was appreciated. Speech  was clear, however, mild+ expressive language deficits were noted c/b hesitations and word finding deficits. When pt was given verbal cues(phonemic mostly), pt was able to complete word/phrase. Her presentation warrants further assessment and f/u at discharge to continue to improve her verbal communication for ADLs. At this time, pt appears at reduced risk for aspiration following aspiration precautions. Family will monitor and cut meats/foods; rec. regular diet w/ thin liquids; meds in puree w/ NSG. Education given to pt/family and NSG.     Aspiration Risk    (reduced)    Diet Recommendation Age appropriate regular solids;Thin   Medication Administration: Whole meds with puree Compensations: Slow rate;Small sips/bites    Other  Recommendations Recommended Consults:  (PT/OT to assess) Oral Care Recommendations: Oral care BID;Oral care before and after PO (staff/family to assist) Other Recommendations:  (reduce distractions during meals)   Follow Up Recommendations       Frequency and Duration min 3x week  1 week   Pertinent Vitals/Pain Denied    SLP Swallow Goals     Swallow Study Prior Functional Status  Type of Home: House Available Help at Discharge: Family (family will likely will arrange paid assistance for a while)    General Date of Onset: 01/31/15 (per pt report, expressive language deficits ~2-3 days) Other Pertinent Information: Pt and family reported expressive language deficits for ~2-3 days prior to admission. Pt and family denied any noted difficulties swallowing during this time; no noted coughing or choking. Pt did acknowledge pills could be difficult to swallow. Type of Study: Bedside swallow evaluation Previous Swallow Assessment: none per pt Diet Prior to this Study: Regular;Thin liquids Temperature Spikes Noted: No Respiratory Status: Supplemental O2 delivered via (comment) (2 liters) History of Recent Intubation: No Behavior/Cognition: Cooperative;Alert;Pleasant mood (min. nervous appearing; min. expressive language deficits) Oral Cavity - Dentition: Adequate natural dentition/normal for age Self-Feeding Abilities: Able to feed self;Needs set up Patient Positioning: Upright in bed Baseline Vocal Quality: Normal Volitional Cough: Strong Volitional Swallow: Able to elicit    Oral/Motor/Sensory Function Overall Oral Motor/Sensory Function: Appears within functional limits for tasks assessed Labial ROM: Within Functional Limits Labial Symmetry: Within Functional Limits Labial Strength: Within Functional  Limits Lingual ROM: Within Functional Limits Lingual Symmetry: Within Functional Limits Lingual Strength: Within Functional Limits Facial Symmetry: Within Functional Limits Velum: Within Functional Limits   Ice Chips Ice chips: Impaired (inconsistent throat clearing occured x2 b/t trials given) Presentation: Spoon (x5 chips) Oral Phase Impairments:  (none) Oral Phase Functional Implications:  (none) Pharyngeal Phase Impairments:  (throat clearing b/t trials; inconsistent x2) Other Comments:  (did not inc. as trials were given; pt stated she was "dry")   Thin Liquid Thin Liquid: Within functional limits Presentation: Cup;Self Fed (x10 trials) Other Comments:  (instructed to use small, single sips, slowly)    Nectar Thick Nectar Thick Liquid: Not tested   Honey Thick Honey Thick Liquid: Not tested   Puree Puree: Within functional limits Presentation: Spoon;Self Fed (~3 ozs total)   Solid   GO    Solid: Within functional limits Presentation: Spoon (x3 trials) Other Comments:  (no oral phase deficits noted w/ trials)       Caeleb Batalla 02/01/2015,5:03 PM

## 2015-02-01 NOTE — Evaluation (Signed)
Physical Therapy Evaluation Patient Details Name: Dawn Bradford MRN: [id number] DOB: [birth year] Today's Date: 02/01/2015   History of Present Illness  Pt with confusion, difficulty speaking, MRI reveals emboli-shower (multi mini strokes)  Clinical Impression  Pt with some apparent expressive aphasia, but does not have extensive physical deficits related to the CVA.  She may have some minimal R hand coordination and grip strength changes from baseline, but she is able to ambulate with slow, but safe cadence and generally does well during PT exam.     Follow Up Recommendations Home health PT    Equipment Recommendations       Recommendations for Other Services       Precautions / Restrictions Precautions Precautions: Fall Restrictions Weight Bearing Restrictions: No      Mobility  Bed Mobility Overal bed mobility: Modified Independent             General bed mobility comments: Pt slow with supine to sit and needs rails, but able to get EOB with only CGA  Transfers Overall transfer level: Modified independent                  Ambulation/Gait Ambulation/Gait assistance: Min guard Ambulation Distance (Feet): 40 Feet Assistive device:  (initially uses walker for ~10 ft, then no AD)       General Gait Details: Pt is slower than her baseline, but is able to maintain balance and is safe with ambulation.  She does fatigue on 2 liters o2, but ultimately is not too far off her baseline.   Stairs            Wheelchair Mobility    Modified Rankin (Stroke Patients Only)       Balance Overall balance assessment: Modified Independent                                           Pertinent Vitals/Pain Pain Assessment: No/denies pain    Home Living Family/patient expects to be discharged to:: Private residence Living Arrangements: Spouse/significant other Available Help at Discharge: Family (family will likely will arrange paid assistance  for a while) Type of Home: House Home Access: Stairs to enter   Technical brewer of Steps: 2 Home Layout: One level        Prior Function Level of Independence: Independent         Comments: Dialysis 3X/wk, but also out of the house regularly     Hand Dominance   Dominant Hand: Right    Extremity/Trunk Assessment   Upper Extremity Assessment: Defer to OT evaluation (Pt appears to have some R grip weakness)           Lower Extremity Assessment: Generalized weakness         Communication   Communication: Expressive difficulties  Cognition Arousal/Alertness: Awake/alert Behavior During Therapy: WFL for tasks assessed/performed Overall Cognitive Status: Within Functional Limits for tasks assessed                      General Comments      Exercises        Assessment/Plan    PT Assessment Patient needs continued PT services  PT Diagnosis Difficulty walking;Generalized weakness   PT Problem List Decreased strength;Decreased activity tolerance;Decreased balance;Decreased mobility;Decreased safety awareness  PT Treatment Interventions Gait training;Therapeutic exercise;Balance training;Therapeutic activities;Stair training;Neuromuscular re-education   PT Goals (Current goals can  be found in the Care Plan section) Acute Rehab PT Goals Patient Stated Goal: To go home PT Goal Formulation: With patient/family Time For Goal Achievement: 02/15/15 Potential to Achieve Goals: Good    Frequency 7X/week   Barriers to discharge        Co-evaluation               End of Session Equipment Utilized During Treatment: Gait belt Activity Tolerance: Patient limited by fatigue Patient left: with chair alarm set           Time: 1305-1340 PT Time Calculation (min) (ACUTE ONLY): 35 min   Charges:   PT Evaluation $Initial PT Evaluation Tier I: 1 Procedure     PT G Codes:       Wayne Both, PT, DPT [phone]  Kreg Shropshire 02/01/2015,  4:55 PM

## 2015-02-01 NOTE — Progress Notes (Addendum)
[facility] consent and dialysis procedure consent forms signed and placed in chart.   Notified respiratory to please come set up CPAP per pt. request.

## 2015-02-01 NOTE — Progress Notes (Signed)
[facility]  ROUNDING NOTE   Subjective:  Pt well known to [place].  Presented with expressive aphasia. Found to have embolic phenomena in the left hemisphere. Speech improving but not yet at baseline. Had HD on Friday.  Objective:  Vital signs in last 24 hours:  Temp:  [97.6 F (36.4 C)-99 F (37.2 C)] 97.6 F (36.4 C) (05/29 1221) Pulse Rate:  [62-70] 68 (05/29 1221) Resp:  [14-24] 24 (05/29 1221) BP: (144-214)/(57-102) 144/65 mmHg (05/29 1221) SpO2:  [94 %-98 %] 96 % (05/29 1221) Weight:  [109.317 kg (241 lb)-113.399 kg (250 lb)] 109.317 kg (241 lb) (05/29 1135)  Weight change:  Filed Weights   01/31/15 1744 01/31/15 2227  Weight: 113.399 kg (250 lb) 109.453 kg (241 lb 4.8 oz)    Intake/Output: I/O last 3 completed shifts: In: -  Out: 1 [Urine:1]   Intake/Output this shift:     Physical Exam: General: NAD  Head: Normocephalic, atraumatic. Moist oral mucosal membranes  Eyes: Anicteric, PERRL  Neck: Supple, trachea midline  Lungs:  Clear to auscultation normal effort  Heart: Regular rate and rhythm no rubs or murmurs  Abdomen:  Soft, nontender, BS present  Extremities:  trace peripheral edema.  Neurologic: Awake, alert, strength 5/5 in both upper and lower extremeties, has difficulty with word finding.  Skin: No lesions  Access: LUE AVF    Basic Metabolic Panel:  Recent Labs Lab 01/31/15 1857  NA 135  K 4.3  CL 102  CO2 26  GLUCOSE 121*  BUN 27*  CREATININE 2.56*  CALCIUM 8.8*    Liver Function Tests:  Recent Labs Lab 01/31/15 1857  AST 17  ALT 11*  ALKPHOS 74  BILITOT 0.4  PROT 6.2*  ALBUMIN 3.4*   No results for input(s): LIPASE, AMYLASE in the last 168 hours. No results for input(s): AMMONIA in the last 168 hours.  CBC:  Recent Labs Lab 01/31/15 1857  WBC 7.8  NEUTROABS 4.8  HGB 11.2*  HCT 33.5*  MCV 98.2  PLT 142*    Cardiac Enzymes:  Recent Labs Lab 01/31/15 1857  TROPONINI <0.03    BNP: Invalid  input(s): POCBNP  CBG:  Recent Labs Lab 02/01/15 0734 02/01/15 1217  GLUCAP 118* 88    Microbiology: No results found for this or any previous visit.  Coagulation Studies: No results for input(s): LABPROT, INR in the last 72 hours.  Urinalysis:  Recent Labs  01/31/15 1920  COLORURINE YELLOW*  LABSPEC 1.016  PHURINE 5.0  GLUCOSEU NEGATIVE  HGBUR NEGATIVE  BILIRUBINUR NEGATIVE  KETONESUR NEGATIVE  PROTEINUR 30*  NITRITE NEGATIVE  LEUKOCYTESUR NEGATIVE      Imaging: Ct Head Wo Contrast  01/31/2015   CLINICAL DATA:  Expressive aphasia  EXAM: CT HEAD WITHOUT CONTRAST  TECHNIQUE: Contiguous axial images were obtained from the base of the skull through the vertex without intravenous contrast.  COMPARISON:  10/27/2010  FINDINGS: Bony calvarium is intact. Mild atrophic changes are noted commensurate with patient's given age. No findings to suggest acute hemorrhage, acute infarction or space-occupying mass lesion are noted. Stable lacunar infarct is noted in the left basal ganglia.  IMPRESSION: No acute abnormality noted.   Electronically Signed   By: Inez Catalina M.D.   On: 01/31/2015 18:07   Mr Brain Wo Contrast  02/01/2015   CLINICAL DATA:  Patient developed aphasia 3-4 days ago. No reported RIGHT-sided weakness. Stroke risk factors include ESRD, hypertension, and diabetes.  EXAM: MRI HEAD WITHOUT CONTRAST  TECHNIQUE: Multiplanar, multiecho  pulse sequences of the brain and surrounding structures were obtained without intravenous contrast.  COMPARISON:  CT head 01/31/2015 was unremarkable. Carotid Dopplers 02/01/2015 suggests BILATERAL non stenotic plaque.  FINDINGS: Multifocal areas of restricted diffusion throughout the LEFT hemisphere involving the temporal lobe, insula, frontal lobe, and parietal lobe, as well as the adjacent subcortical white matter, LEFT MCA territory distribution, non-confluent and nonhemorrhagic, suggesting a shower of emboli. No RIGHT-sided, brainstem, or  cerebellar abnormalities.  No mass lesion, hydrocephalus, or extra-axial fluid.  Generalized atrophy. Moderately advanced T2 and FLAIR hyperintensity throughout the white matter suggesting small vessel disease. No midline abnormalities. Flow voids are maintained. No evidence for large vessel occlusion.  Extracranial soft tissues unremarkable.  Compared with prior CT, these acute infarcts are not visible.  IMPRESSION: Multifocal areas of restricted diffusion, non-confluent and nonhemorrhagic throughout the LEFT hemisphere, LEFT MCA territory distribution, consistent with a shower of emboli.  Atrophy and small vessel disease.  No evidence for large vessel occlusion.   Electronically Signed   By: Rolla Flatten M.D.   On: 02/01/2015 12:23   US Carotid Bilateral  02/01/2015   CLINICAL DATA:  Stroke.  Hypertension, diabetes.  EXAM: BILATERAL CAROTID DUPLEX ULTRASOUND  TECHNIQUE: Pearline Cables scale imaging, color Doppler and duplex ultrasound was performed of bilateral carotid and vertebral arteries in the neck.  COMPARISON:  None.  REVIEW OF SYSTEMS: Quantification of carotid stenosis is based on velocity parameters that correlate the residual internal carotid diameter with NASCET-based stenosis levels, using the diameter of the distal internal carotid lumen as the denominator for stenosis measurement.  The following velocity measurements were obtained:  PEAK SYSTOLIC/END DIASTOLIC  RIGHT  ICA:                     93/14cm/sec  CCA:                     [id number]  SYSTOLIC ICA/CCA RATIO:  1.0  DIASTOLIC ICA/CCA RATIO: 1.0  ECA:                     127cm/sec  LEFT  ICA:                     93/15cm/sec  CCA:                     0000000  SYSTOLIC ICA/CCA RATIO:  1.0  DIASTOLIC ICA/CCA RATIO: 1.3  ECA:                     75cm/sec  FINDINGS: RIGHT CAROTID ARTERY: Eccentric plaque in the distal common carotid artery, bulb, and proximal ICA, without high-grade stenosis. Normal waveforms and color Doppler signal. ICA is tortuous.   RIGHT VERTEBRAL ARTERY:  Normal flow direction and waveform.  LEFT CAROTID ARTERY: Calcified plaque in the mid common carotid artery resulting in at least mild stenosis. Eccentric calcified plaque in the distal common carotid, bulb, and proximal ICA with mild stenosis. Normal waveforms and color Doppler signal. ICA mildly tortuous.  LEFT VERTEBRAL ARTERY: Normal flow direction and waveform.  IMPRESSION: 1. Bilateral carotid bifurcation and proximal ICA plaque resulting in less than 50% diameter stenosis. The exam does not exclude plaque ulceration or embolization. Continued surveillance recommended.   Electronically Signed   By: Lucrezia Europe M.D.   On: 02/01/2015 11:39     Medications:     . aspirin EC  81 mg Oral Daily  .  carvedilol  25 mg Oral BID WC  . FLUoxetine  10 mg Oral Daily  . furosemide  40 mg Oral Daily  . heparin  5,000 Units Subcutaneous 3 times per day  . hydrALAZINE  25 mg Oral TID  . insulin aspart  0-5 Units Subcutaneous QHS  . insulin aspart  0-9 Units Subcutaneous TID WC  . insulin aspart  6 Units Subcutaneous TID WC  . insulin glargine  38 Units Subcutaneous QHS  . levothyroxine  75 mcg Oral QAC breakfast  . nystatin   Topical BID  . rosuvastatin  40 mg Oral QHS  . traZODone  50 mg Oral QHS   acetaminophen **OR** acetaminophen, labetalol, lactulose, senna-docusate, triamcinolone cream  Assessment/ Plan:  79 y.o. female with past medical history of ESRD on HD MWF, DM-2, Hypertension, hyperlipidemia, renal artery stenosis, peripheral vascular disease, obstructive sleep apena, hypothyroidism, carotid artery disease, history of myocardial infarction, anemia of CKD, SHPTH, hypothyroidism  1.  ESRD on HD: had HD on Friday, no acute indication for HD today, will plan for HD again tomorrow. 2.  Anemia of CKD:  Hold epogen for now, pt had embolic CVA, may need to consult with hematology regarding further use of epogen. 3.  SHPTH: check phos with HD tomorrow. 4.  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  Pt will likely need dual antiplatelet therapy for a period of time per neurology and will also need CTA which we have no issue with.   5.  Thanks for    LOS: 1 Deandra Goering 5/29/20161:22 PM

## 2015-02-01 NOTE — Progress Notes (Signed)
OT Cancellation Note  Patient Details Name: Dawn Bradford MRN: [id number] DOB: 1936-03-27   Cancelled Treatment:     Patient out of room for EKG and MRI. Will re-try time permitting. Sharon Mt, MS/OTR/L Valente David M 02/01/2015, 10:24 AM

## 2015-02-01 NOTE — Evaluation (Signed)
Occupational Therapy Evaluation Patient Details Name: Dawn Bradford MRN: [id number] DOB: 1936/07/14 Today's Date: 02/01/2015    History of Present Illness This patient is a 79 year old female who came to [facility] with stroke simptomes.   Clinical Impression   This patient is a 79 year old female who came to [facility] stroke symptoms. She lives in a home with her husbane. She had been independent with basic ADL and functional mobility and uses O2 most of the time. Today R UE shoulder flexion is full range and 4-/5, elbow 4/5, wrist 5/5 grip 33 lbs. L UE shoulder flexion is full range, 4+/5, distal motions are 5/5 and grip is 25lbs. She would benefit from Occupational Therapy for ADL training to aid in preventing or reducing shortness of breath during lower body dressing.       Follow Up Recommendations       Equipment Recommendations       Recommendations for Other Services       Precautions / Restrictions        Mobility Bed Mobility                  Transfers                      Balance                                            ADL                                         General ADL Comments: Patient requires minimal assist for lower body dressing but gets short of breath when reaching down. Upper extremity dressing set up only. Toileting would be contact guard.     Vision     Perception     Praxis      Pertinent Vitals/Pain Pain Assessment: No/denies pain     Hand Dominance Right   Extremity/Trunk Assessment             Communication Communication Communication: Expressive difficulties (mild)   Cognition Arousal/Alertness: Awake/alert Behavior During Therapy: WFL for tasks assessed/performed Overall Cognitive Status: Within Functional Limits for tasks assessed                     General Comments       Exercises       Shoulder Instructions      Home Living  Family/patient expects to be discharged to:: Private residence Living Arrangements: Spouse/significant other                                      Prior Functioning/Environment Level of Independence: Independent             OT Diagnosis: Paresis   OT Problem List: Decreased strength;Decreased activity tolerance;Decreased coordination;Impaired sensation;Obesity   OT Treatment/Interventions: Self-care/ADL training;Neuromuscular education;DME and/or AE instruction    OT Goals(Current goals can be found in the care plan section) Acute Rehab OT Goals Patient Stated Goal: To go home OT Goal Formulation: With patient/family Time For Goal Achievement: 02/11/15 Potential to Achieve Goals: Good  OT Frequency: Min 1X/week   Barriers  to D/C:            Co-evaluation              End of Session    Activity Tolerance:   Patient left:     Time: 1420-1445 OT Time Calculation (min): 25 min Charges:  OT General Charges $OT Visit: 1 Procedure OT Evaluation $Initial OT Evaluation Tier I: 1 Procedure OT Treatments $Self Care/Home Management : 8-22 mins G-Codes: OT G-codes **NOT FOR INPATIENT CLASS** Functional Limitation: Self care  Myrene Galas, MS/OTR/L  02/01/2015, 3:05 PM

## 2015-02-01 NOTE — Progress Notes (Signed)
*  PRELIMINARY RESULTS* Echocardiogram 2D Echocardiogram has been performed.  Lavell Luster Stills 02/01/2015, 8:38 AM

## 2015-02-01 NOTE — Progress Notes (Signed)
Noted the sutures on the  permcath off and the catheter is coming off from the skin. The RN secured the permcath with tape to  anchor it to the skin. No bleeding or any kind of fluid noted.  Dr. Reece Levy, no new order but to keep on monitoring.

## 2015-02-01 NOTE — Progress Notes (Signed)
Two 2A floor nurses have attempted to obtain IV access with no success. Nursing supervisor notified and unable to aide, told to notify CCU. Katie RN from CCU asked to come try and she said she will come when she can.  

## 2015-02-01 NOTE — Progress Notes (Signed)
Patient admitted to room 236 with her daughter at bedside. Patient alert and oriented but having aphasia symptoms.  Remain NPO after failing the swallowing test at ED. CBG=89 and patient had Lantus 38 units to be administered. Dr Volanda Napoleon notified and received new order to hold the Lantus for tonight and hold all oral medications as well this shift.  Dr. Reece Levy also notified since patient has her own CPAP but has no order. Order for CPAP at bedtime received.

## 2015-02-01 NOTE — Progress Notes (Signed)
[facility] NAME: Dawn Bradford    MR#:  [id number]  DATE OF BIRTH:  1936/02/25  SUBJECTIVE:  CHIEF COMPLAINT:   Chief Complaint  Patient presents with  . Aphasia    difficulty speaking x 2 days, dialysis yesterday   Still has word finding difficulty. No other concerns. REVIEW OF SYSTEMS:    Review of Systems  Constitutional: Negative for fever and chills.  HENT: Negative for sore throat.   Eyes: Negative for blurred vision, double vision and pain.  Respiratory: Negative for cough, hemoptysis, shortness of breath and wheezing.   Cardiovascular: Negative for chest pain, palpitations, orthopnea and leg swelling.  Gastrointestinal: Negative for heartburn, nausea, vomiting, abdominal pain, diarrhea and constipation.  Genitourinary: Negative for dysuria and hematuria.  Musculoskeletal: Negative for back pain and joint pain.  Skin: Negative for rash.  Neurological: Negative for sensory change, speech change, focal weakness and headaches.  Endo/Heme/Allergies: Does not bruise/bleed easily.  Psychiatric/Behavioral: Negative for depression. The patient is not nervous/anxious.          DRUG ALLERGIES:   Allergies  Allergen Reactions  . Ace Inhibitors     unknown  . Amlodipine Besylate     "something happened to gums"  . Clonidine Hydrochloride Other (See Comments)    Unknown reaction.  . Codeine Hives  . Metformin     REACTION: vomiting    VITALS:  Blood pressure 152/62, pulse 72, temperature 98.2 F (36.8 C), temperature source Oral, resp. rate 18, height 5\' 2"  (1.575 m), weight 109.453 kg (241 lb 4.8 oz), SpO2 95 %.  PHYSICAL EXAMINATION:   Physical Exam  GENERAL:  79 y.o.-year-old patient lying in the bed with no acute distress.  EYES: Pupils equal, round, reactive to light and accommodation. No scleral icterus. Extraocular muscles intact.  HEENT: Head atraumatic, normocephalic. Oropharynx and nasopharynx  clear.  NECK:  Supple, no jugular venous distention. No thyroid enlargement, no tenderness.  LUNGS: Normal breath sounds bilaterally, no wheezing, rales, rhonchi. No use of accessory muscles of respiration.  CARDIOVASCULAR: S1, S2 normal. No murmurs, rubs, or gallops.  ABDOMEN: Soft, nontender, nondistended. Bowel sounds present. No organomegaly or mass.  EXTREMITIES: No cyanosis, clubbing or edema b/l.    NEUROLOGIC: Cranial nerves II through XII are intact. No focal Motor or sensory deficits b/l.  Word finding difficulty. PSYCHIATRIC: The patient is alert and oriented x 3.  SKIN: No obvious rash, lesion, or ulcer.    LABORATORY PANEL:   CBC  Recent Labs Lab 01/31/15 1857  WBC 7.8  HGB 11.2*  HCT 33.5*  PLT 142*   ------------------------------------------------------------------------------------------------------------------  Chemistries   Recent Labs Lab 01/31/15 1857  NA 135  K 4.3  CL 102  CO2 26  GLUCOSE 121*  BUN 27*  CREATININE 2.56*  CALCIUM 8.8*  AST 17  ALT 11*  ALKPHOS 74  BILITOT 0.4   ------------------------------------------------------------------------------------------------------------------  Cardiac Enzymes  Recent Labs Lab 01/31/15 1857  TROPONINI <0.03   ------------------------------------------------------------------------------------------------------------------  RADIOLOGY:  Ct Head Wo Contrast  01/31/2015   CLINICAL DATA:  Expressive aphasia  EXAM: CT HEAD WITHOUT CONTRAST  TECHNIQUE: Contiguous axial images were obtained from the base of the skull through the vertex without intravenous contrast.  COMPARISON:  10/27/2010  FINDINGS: Bony calvarium is intact. Mild atrophic changes are noted commensurate with patient's given age. No findings to suggest acute hemorrhage, acute infarction or space-occupying mass lesion are noted. Stable lacunar infarct is noted in the left  basal ganglia.  IMPRESSION: No acute abnormality noted.    Electronically Signed   By: Inez Catalina M.D.   On: 01/31/2015 18:07   Mr Brain Wo Contrast  02/01/2015   CLINICAL DATA:  Patient developed aphasia 3-4 days ago. No reported RIGHT-sided weakness. Stroke risk factors include ESRD, hypertension, and diabetes.  EXAM: MRI HEAD WITHOUT CONTRAST  TECHNIQUE: Multiplanar, multiecho pulse sequences of the brain and surrounding structures were obtained without intravenous contrast.  COMPARISON:  CT head 01/31/2015 was unremarkable. Carotid Dopplers 02/01/2015 suggests BILATERAL non stenotic plaque.  FINDINGS: Multifocal areas of restricted diffusion throughout the LEFT hemisphere involving the temporal lobe, insula, frontal lobe, and parietal lobe, as well as the adjacent subcortical white matter, LEFT MCA territory distribution, non-confluent and nonhemorrhagic, suggesting a shower of emboli. No RIGHT-sided, brainstem, or cerebellar abnormalities.  No mass lesion, hydrocephalus, or extra-axial fluid.  Generalized atrophy. Moderately advanced T2 and FLAIR hyperintensity throughout the white matter suggesting small vessel disease. No midline abnormalities. Flow voids are maintained. No evidence for large vessel occlusion.  Extracranial soft tissues unremarkable.  Compared with prior CT, these acute infarcts are not visible.  IMPRESSION: Multifocal areas of restricted diffusion, non-confluent and nonhemorrhagic throughout the LEFT hemisphere, LEFT MCA territory distribution, consistent with a shower of emboli.  Atrophy and small vessel disease.  No evidence for large vessel occlusion.   Electronically Signed   By: Rolla Flatten M.D.   On: 02/01/2015 12:23   US Carotid Bilateral  02/01/2015   CLINICAL DATA:  Stroke.  Hypertension, diabetes.  EXAM: BILATERAL CAROTID DUPLEX ULTRASOUND  TECHNIQUE: Pearline Cables scale imaging, color Doppler and duplex ultrasound was performed of bilateral carotid and vertebral arteries in the neck.  COMPARISON:  None.  REVIEW OF SYSTEMS: Quantification  of carotid stenosis is based on velocity parameters that correlate the residual internal carotid diameter with NASCET-based stenosis levels, using the diameter of the distal internal carotid lumen as the denominator for stenosis measurement.  The following velocity measurements were obtained:  PEAK SYSTOLIC/END DIASTOLIC  RIGHT  ICA:                     93/14cm/sec  CCA:                     [id number]  SYSTOLIC ICA/CCA RATIO:  1.0  DIASTOLIC ICA/CCA RATIO: 1.0  ECA:                     127cm/sec  LEFT  ICA:                     93/15cm/sec  CCA:                     0000000  SYSTOLIC ICA/CCA RATIO:  1.0  DIASTOLIC ICA/CCA RATIO: 1.3  ECA:                     75cm/sec  FINDINGS: RIGHT CAROTID ARTERY: Eccentric plaque in the distal common carotid artery, bulb, and proximal ICA, without high-grade stenosis. Normal waveforms and color Doppler signal. ICA is tortuous.  RIGHT VERTEBRAL ARTERY:  Normal flow direction and waveform.  LEFT CAROTID ARTERY: Calcified plaque in the mid common carotid artery resulting in at least mild stenosis. Eccentric calcified plaque in the distal common carotid, bulb, and proximal ICA with mild stenosis. Normal waveforms and color Doppler signal. ICA mildly tortuous.  LEFT VERTEBRAL ARTERY: Normal flow direction and  waveform.  IMPRESSION: 1. Bilateral carotid bifurcation and proximal ICA plaque resulting in less than 50% diameter stenosis. The exam does not exclude plaque ulceration or embolization. Continued surveillance recommended.   Electronically Signed   By: Lucrezia Europe M.D.   On: 02/01/2015 11:39     ASSESSMENT AND PLAN:   #1 Acute L MCA multiple emboli On ASA, Statin. Discussed with Dr. Irish Elders. Will need CTA. Ordered. Started on diet.   #2 end-stage renal disease on hemodialysis: Nephrology consultation. She has not missed any treatments but will need dialysis tomorrow.  #3 diabetes mellitus type 2: Check a hemoglobin A1c. Continue Lantus 38 units, 10 units of  NovoLog with meals, sliding scale  #4 hypertension: Permissive HTN for CVA  #5 chronic diastolic heart failure:  Euvolemic. Continue HD     All the records are reviewed and case discussed with Care Management/Social Workerr. Management plans discussed with the patient, family and they are in agreement.  CODE STATUS: FULL CODE  DVT Prophylaxis: Lovenox  TOTAL TIME TAKING CARE OF THIS PATIENT: 40 minutes.   POSSIBLE D/C IN 1 DAYS, DEPENDING ON CLINICAL CONDITION.   Hillary Bow R M.D on 02/01/2015 at 9:56 PM  Between 7am to 6pm - Pager - [phone]  After 6pm go to [email or web address] - password [email or web address]  Office  [phone]  CC: Primary care physician; Hortencia Pilar, MD

## 2015-02-01 NOTE — Consult Note (Addendum)
CC: aphasia.    HPI: Dawn Bradford is an 79 y.o. female 79 y.o. female with a known history of coronary artery disease status post bypass grafting, end-stage renal disease on hemodialysis, diabetes mellitus type 2 and obesity presents with 3 days of word finding difficulty. Pt was unable to form complete sentences after dialysis, got very frustrated and went to bed. On day of admission patientcalled daughter crying stating that she needed help but was unable to say what she needed. CTH no acute abnormalities.    Pt's speech is improving.   Past Medical History  Diagnosis Date  . Hyperlipidemia   . Hypertension     resistant hyptertension times many years. the patient does have renal artery stenosis. she has tried calcium channel blockers in the past and states that she would not take them now because she had some problems with her gums which her dentist identified as calcium-channel blocker side effects.  . Diabetes mellitus     type 2  . Coronary artery disease     s/p anterior MI in 1996 followed by CABG. Lexiscan myoview (4/11): EF 67%, normal perfusion with no evidence for ischemia or infarction.   . Renal artery stenosis     The patient has an occluded right renal artery and an atrophic right kidney. There is 20% left renal artery stenosis. This was seen by catheterization in 2007  . Diastolic heart failure     most recent echo (12/10) showed EF 55-60% with mild LVH, grade I diastolic dysfunction, mild LAE.  [name] Aortic valve disorder     Echo in 2009 showed mean aortic valve gradient of 13 mmHg, suggesting very mild stenosis. Echo (12/10) suggested aortic sclerosis only  . Obesity   . Chronic kidney disease     most recent creatinine was 1.2  . Obstructive sleep apnea     PSG 01/05/2006 AHI 24.8, CPAP 9cm H2O  . Mild asthma     PFT 02/05/10 FEV1 1.38, FEV1% 73, TLC 3.78 (86%), DLCO 48%, +BD  . ACE-inhibitor cough   . Hypothyroidism   . Obesity hypoventilation syndrome        .  Pulmonary nodule, right   . Carotid artery disease     mild, carotid dopplers 12/2009  . Myocardial infarction     Past Surgical History  Procedure Laterality Date  . Tubal ligation  1973  . Inner ear surgery  1979    right  . Abdominal hysterectomy  1985  . Coronary artery bypass graft  1996  . Cataract extraction  2010  . Av fistula placement    . Dialysis fistula creation    . Peripheral vascular catheterization Left 01/06/2015    Procedure: A/V Shuntogram/Fistulagram;  Surgeon: Katha Cabal, MD;  Location: [facility];  Service: Cardiovascular;  Laterality: Left;    Family History  Problem Relation Age of Onset  . Kidney failure Mother   . Diabetes Mother   . Aortic aneurysm Father   . Coronary artery disease Father   . Heart attack Father   . Diabetes Sister   . Diabetes Brother   . Kidney failure Brother     Social History:  reports that she has never smoked. She has never used smokeless tobacco. She reports that she does not drink alcohol or use illicit drugs.  Allergies  Allergen Reactions  . Ace Inhibitors     unknown  . Amlodipine Besylate     "something happened to gums"  .  Clonidine Hydrochloride Other (See Comments)    Unknown reaction.  . Codeine Hives  . Metformin     REACTION: vomiting    Medications: I have reviewed the patient's current medications.  ROS: History obtained from the patient  General ROS: negative for - chills, fatigue, fever, night sweats, weight gain or weight loss Psychological ROS: negative for - behavioral disorder, hallucinations, memory difficulties, mood swings or suicidal ideation Ophthalmic ROS: negative for - blurry vision, double vision, eye pain or loss of vision ENT ROS: negative for - epistaxis, nasal discharge, oral lesions, sore throat, tinnitus or vertigo Allergy and Immunology ROS: negative for - hives or itchy/watery eyes Hematological and Lymphatic ROS: negative for - bleeding problems, bruising  or swollen lymph nodes Endocrine ROS: negative for - galactorrhea, hair pattern changes, polydipsia/polyuria or temperature intolerance Respiratory ROS: negative for - cough, hemoptysis, shortness of breath or wheezing Cardiovascular ROS: negative for - chest pain, dyspnea on exertion, edema or irregular heartbeat Gastrointestinal ROS: negative for - abdominal pain, diarrhea, hematemesis, nausea/vomiting or stool incontinence Genito-Urinary ROS: negative for - dysuria, hematuria, incontinence or urinary frequency/urgency Musculoskeletal ROS: negative for - joint swelling or muscular weakness Neurological ROS: as noted in HPI Dermatological ROS: negative for rash and skin lesion changes  Physical Examination: Blood pressure 152/64, pulse 69, temperature 98.3 F (36.8 C), temperature source Oral, resp. rate 16, height 5\' 2"  (1.575 m), weight 109.453 kg (241 lb 4.8 oz), SpO2 96 %.  HEENT-  Normocephalic, no lesions, without obvious abnormality.  Normal external eye and conjunctiva.  Normal TM's bilaterally.  Normal auditory canals and external ears. Normal external nose, mucus membranes and septum.  Normal pharynx. Cardiovascular- regular rate and rhythm, S1, S2 normal, no murmur, click, rub or gallop, pulses palpable throughout   Lungs- chest clear, no wheezing, rales, normal symmetric air entry, Heart exam - S1, S2 normal, no murmur, no gallop, rate regular Abdomen- soft, non-tender; bowel sounds normal; no masses,  no organomegaly Extremities- less then 2 second capillary refill Lymph-no adenopathy palpable Musculoskeletal-no joint tenderness, deformity or swelling Skin-warm and dry, no hyperpigmentation, vitiligo, or suspicious lesions  Neurological Examination Mental Status: Alert, oriented, thought content appropriate.  Speech aphasic.  Able to follow 3 step commands without difficulty. Cranial Nerves: II: Discs flat bilaterally; Visual fields grossly normal, pupils equal, round,  reactive to light and accommodation III,IV, VI: ptosis not present, extra-ocular motions intact bilaterally V,VII: smile symmetric, facial light touch sensation normal bilaterally VIII: hearing normal bilaterally IX,X: gag reflex present XI: bilateral shoulder shrug XII: midline tongue extension Motor: Right : Upper extremity   5/5    Left:     Upper extremity   5/5  Lower extremity   5/5     Lower extremity   5/5 Tone and bulk:normal tone throughout; no atrophy noted Sensory: Pinprick and light touch intact throughout, bilaterally Deep Tendon Reflexes: 2+ and symmetric throughout Plantars: Right: downgoing   Left: downgoing Cerebellar: normal finger-to-nose, normal rapid alternating movements and normal heel-to-shin test Gait: not examined.        Laboratory Studies:   Basic Metabolic Panel:  Recent Labs Lab 01/31/15 1857  NA 135  K 4.3  CL 102  CO2 26  GLUCOSE 121*  BUN 27*  CREATININE 2.56*  CALCIUM 8.8*    Liver Function Tests:  Recent Labs Lab 01/31/15 1857  AST 17  ALT 11*  ALKPHOS 74  BILITOT 0.4  PROT 6.2*  ALBUMIN 3.4*   No results for input(s): LIPASE, AMYLASE  in the last 168 hours. No results for input(s): AMMONIA in the last 168 hours.  CBC:  Recent Labs Lab 01/31/15 1857  WBC 7.8  NEUTROABS 4.8  HGB 11.2*  HCT 33.5*  MCV 98.2  PLT 142*    Cardiac Enzymes:  Recent Labs Lab 01/31/15 1857  TROPONINI <0.03    BNP: Invalid input(s): POCBNP  CBG:  Recent Labs Lab 02/01/15 [place]*    Microbiology: No results found for this or any previous visit.  Coagulation Studies: No results for input(s): LABPROT, INR in the last 72 hours.  Urinalysis:  Recent Labs Lab 01/31/15 1920  COLORURINE YELLOW*  LABSPEC 1.016  PHURINE 5.0  GLUCOSEU NEGATIVE  HGBUR NEGATIVE  BILIRUBINUR NEGATIVE  KETONESUR NEGATIVE  PROTEINUR 30*  NITRITE NEGATIVE  LEUKOCYTESUR NEGATIVE    Lipid Panel:     Component Value Date/Time    CHOL 122 02/01/2015 0433   CHOL 123 10/21/2011 0844   TRIG 68 02/01/2015 0433   TRIG 95 12/08/2009   HDL 43 02/01/2015 0433   HDL 43 10/21/2011 0844   CHOLHDL 2.8 02/01/2015 0433   CHOLHDL 2.9 10/21/2011 0844   VLDL 14 02/01/2015 0433   LDLCALC 65 02/01/2015 0433   LDLCALC 61 10/21/2011 0844    HgbA1C:  Lab Results  Component Value Date   HGBA1C 8.6 12/08/2009    Urine Drug Screen:  No results found for: LABOPIA, COCAINSCRNUR, LABBENZ, AMPHETMU, THCU, LABBARB  Alcohol Level: No results for input(s): ETH in the last 168 hours.  Other results: EKG: normal EKG, normal sinus rhythm, unchanged from previous tracings.  Imaging: Ct Head Wo Contrast  01/31/2015   CLINICAL DATA:  Expressive aphasia  EXAM: CT HEAD WITHOUT CONTRAST  TECHNIQUE: Contiguous axial images were obtained from the base of the skull through the vertex without intravenous contrast.  COMPARISON:  10/27/2010  FINDINGS: Bony calvarium is intact. Mild atrophic changes are noted commensurate with patient's given age. No findings to suggest acute hemorrhage, acute infarction or space-occupying mass lesion are noted. Stable lacunar infarct is noted in the left basal ganglia.  IMPRESSION: No acute abnormality noted.   Electronically Signed   By: Inez Catalina M.D.   On: 01/31/2015 18:07     Assessment/Plan: 79 y/o F. With periods of aphasia worsening in the past 3 days. CTH no acute abnormality. Pt was on ASA 81 mg prior to hospital stay.   MRI brain embolic L frontal/L MCA strokes.  Pt's speech is improving but not at baseline  - Increase ASA to 325 daily - Pt is a dialysis pt, will need CTA of head to look for distal L  MCA stenosis since if there will be stenosis pt will need 90 days of dual anti platelet therapy.    Leotis Pain   Addendum:  S/p discussion with daughter. Difficult to obtain IV access. IV access needed for CTA. If unable to obtain will need flow void/ time of flight MRA (MRA of head without  contrast) as pt has ESRD.   Pt was able to ambulate with limited assistance. Appreciate speech eval, pt passed for diet.    Pt/ot Increased ASA to 325 daily.  If there is significant intracranial stenosis would increase to dual anti plt therapy ASA and plavix for 90 days.  S/p discussion to pt's daughters.    Leotis Pain     02/01/2015, 11:27 AM

## 2015-02-01 NOTE — Progress Notes (Signed)
Notified Dr. Darvin Neighbours about inability to obtain new IV access (20g above the wrist). Two nurses have tried and patient only has very poor veins and only one arm available, due to presence of dialysis fistula in left arm. Pt does have IV access, 22g in R Fa, but 20g or bigger is required for CT angio. Dr. Darlyn Chamber and said to wait and try later as exam is a routine one and doesn't need to be rushed.

## 2015-02-02 ENCOUNTER — Encounter: Payer: Self-pay | Admitting: Radiology

## 2015-02-02 ENCOUNTER — Inpatient Hospital Stay: Payer: Medicare Other

## 2015-02-02 LAB — RENAL FUNCTION PANEL
Albumin: 3.2 g/dL — ABNORMAL LOW (ref 3.5–5.0)
Anion gap: 6 (ref 5–15)
BUN: 30 mg/dL — ABNORMAL HIGH (ref 6–20)
CALCIUM: 8.8 mg/dL — AB (ref 8.9–10.3)
CO2: 27 mmol/L (ref 22–32)
CREATININE: 2.58 mg/dL — AB (ref 0.44–1.00)
Chloride: 103 mmol/L (ref 101–111)
GFR calc non Af Amer: 17 mL/min — ABNORMAL LOW (ref >60)
GFR, EST AFRICAN AMERICAN: 20 mL/min — AB (ref >60)
Glucose, Bld: 145 mg/dL — ABNORMAL HIGH (ref 65–99)
POTASSIUM: 4.6 mmol/L (ref 3.5–5.1)
Phosphorus: 3 mg/dL (ref 2.5–4.6)
Sodium: 136 mmol/L (ref 135–145)

## 2015-02-02 LAB — GLUCOSE, CAPILLARY
GLUCOSE-CAPILLARY: 164 mg/dL — AB (ref 65–99)
Glucose-Capillary: 147 mg/dL — ABNORMAL HIGH (ref 65–99)
Glucose-Capillary: 85 mg/dL (ref 65–99)

## 2015-02-02 MED ORDER — LIDOCAINE-PRILOCAINE 2.5-2.5 % EX CREA
1.0000 "application " | TOPICAL_CREAM | CUTANEOUS | Status: DC | PRN
  Filled 2015-02-02: qty 5

## 2015-02-02 MED ORDER — ONDANSETRON HCL 4 MG/2ML IJ SOLN
4.0000 mg | Freq: Once | INTRAMUSCULAR | Status: AC
  Administered 2015-02-02: 4 mg via INTRAVENOUS
  Filled 2015-02-02: qty 2

## 2015-02-02 MED ORDER — HEPARIN SODIUM (PORCINE) 1000 UNIT/ML DIALYSIS
1000.0000 [IU] | INTRAMUSCULAR | Status: DC | PRN
  Filled 2015-02-02: qty 1

## 2015-02-02 MED ORDER — CLOPIDOGREL BISULFATE 75 MG PO TABS
75.0000 mg | ORAL_TABLET | Freq: Every day | ORAL | Status: DC
  Administered 2015-02-03: 75 mg via ORAL
  Filled 2015-02-02: qty 1

## 2015-02-02 MED ORDER — PENTAFLUOROPROP-TETRAFLUOROETH EX AERO: 1.0000 "application " | INHALATION_SPRAY | CUTANEOUS | Status: DC | PRN

## 2015-02-02 MED ORDER — SODIUM CHLORIDE 0.9 % IV SOLN: 100.0000 mL | INTRAVENOUS | Status: DC | PRN

## 2015-02-02 MED ORDER — ALTEPLASE 2 MG IJ SOLR: 2.0000 mg | Freq: Once | INTRAMUSCULAR | Status: AC | PRN

## 2015-02-02 MED ORDER — ASPIRIN 325 MG PO TABS: 325.0000 mg | ORAL_TABLET | Freq: Every day | ORAL | Status: DC

## 2015-02-02 MED ORDER — NEPRO/CARBSTEADY PO LIQD
237.0000 mL | ORAL | Status: DC | PRN
Start: 2015-02-02 — End: 2015-02-02

## 2015-02-02 MED ORDER — LIDOCAINE HCL (PF) 1 % IJ SOLN
5.0000 mL | INTRAMUSCULAR | Status: DC | PRN
  Administered 2015-02-02: 5 mL via INTRADERMAL
  Filled 2015-02-02: qty 5

## 2015-02-02 MED ORDER — ASPIRIN 81 MG PO CHEW
81.0000 mg | CHEWABLE_TABLET | Freq: Every day | ORAL | Status: DC
  Administered 2015-02-03: 81 mg via ORAL
  Filled 2015-02-02: qty 1

## 2015-02-02 MED ORDER — IOHEXOL 350 MG/ML SOLN
100.0000 mL | Freq: Once | INTRAVENOUS | Status: AC | PRN
  Administered 2015-02-02: 100 mL via INTRAVENOUS

## 2015-02-02 MED ORDER — NEPRO/CARBSTEADY PO LIQD
237.0000 mL | Freq: Every day | ORAL | Status: DC
Start: 2015-02-02 — End: 2015-02-03
  Administered 2015-02-02 – 2015-02-03 (×2): 237 mL via ORAL

## 2015-02-02 NOTE — Plan of Care (Signed)
Problem: SLP Dysphagia Goals Goal: Misc Dysphagia Goal Pt will safely tolerate po diet of least restrictive consistency w/ no overt s/s of aspiration noted by Staff/pt/family x3 sessions.    

## 2015-02-02 NOTE — Progress Notes (Signed)
Neurology:  CTA head reviewed:  Pt has calcified cavernous and supraclinoid RICA and LICA without flow reducing lesion as well as L M2 division of the MCA embolus with decreased flow.     Based on these findings pt will be switched to dual anti platelet therapy of ASA 81mg  and Plavix 75 daily for 90 days based on SAMPRISS trial.  After 90 days pt can be switched to monotherapy of Plavix 75 daily.  No further imaging from neuro stand point.  Leotis Pain

## 2015-02-02 NOTE — Care Management Note (Signed)
Case Management Note  Patient Details  Name: Dawn Bradford MRN: [id number] Date of Birth: Jul 17, 1936  Subjective/Objective:                 Presents from home.  Lives with her husband.  Uses a walker to ambulate.  Has ruled in for CVA.  It is anticipated patient will discharge home with home health.  Family interested in list of agencies that provide in home care.  Patient has chronic home 02 through  [facility].   Agency preference for home health nursing, PT and aide is [facility]. Patient received chronic hemodialysis at [facility]  Action/Plan:   Expected Discharge Date:                  Expected Discharge Plan:     In-House Referral:     Discharge planning Services     Post Acute Care Choice:    Choice offered to:     DME Arranged:    DME Agency:     Arranged:   [facility].  Called/ faxed referral [place] Agency:     Status of Service:     Medicare Important Message Given:  Yes Date Medicare IM Given:  02/02/15 Medicare IM give by:  Joni Reining Date Additional Medicare IM Given:    Additional Medicare Important Message give by:     If discussed at [place] of Stay Meetings, dates discussed:    Additional Comments:  Katrina Stack, RN 02/02/2015, 8:36 AM

## 2015-02-02 NOTE — Progress Notes (Signed)
Speech Therapy Note: reviewed chart notes; met w/ Dtr. this AM in room. Pt had just left for a test w/ dialysis to follow soon per report. Dtr. stated pt had a "good night" and tolerated her dinner meal well w/ no overt s/s of aspiration as discussed. She also stated pt had an upset stomach this AM. Discussed ordering foods pt would enjoy as well as might be more bland for her stomach. Reviewed general aspiration precautions and cues to aid in word finding w/ Dtr. Will f/u w/ pt's status tomorrow (not on a dialysis day d/t fatigue). Dtr. agreed. NSG updated. CM and MD given rec. for outpt or [facility] SLP services for cognitive-linguistic assessment/tx.

## 2015-02-02 NOTE — Progress Notes (Addendum)
Initial Nutrition Assessment  DOCUMENTATION CODES:     INTERVENTION:   (Meals and snacks:) Cater to pt prefences Medical Nutrition Supplement: Nepro added per MD order daily for added nutrition  NUTRITION DIAGNOSIS:   (none at this time) related to   as evidenced by  .    GOAL:  Patient will meet greater than or equal to 90% of their needs    MONITOR:   (Energy intake, Electrolyte and renal profile)  REASON FOR ASSESSMENT:  Other (Comment) (dialysis pt)    ASSESSMENT:  Pt admitted with acute CVA, aphasia. During rounds this pm pt at dialysis and unable to speak with pt.    Past Medical History  Diagnosis Date  . Hyperlipidemia   . Hypertension     resistant hyptertension times many years. the patient does have renal artery stenosis. she has tried calcium channel blockers in the past and states that she would not take them now because she had some problems with her gums which her dentist identified as calcium-channel blocker side effects.  . Diabetes mellitus     type 2  . Coronary artery disease     s/p anterior MI in 1996 followed by CABG. Lexiscan myoview (4/11): EF 67%, normal perfusion with no evidence for ischemia or infarction.   . Renal artery stenosis     The patient has an occluded right renal artery and an atrophic right kidney. There is 20% left renal artery stenosis. This was seen by catheterization in 2007  . Diastolic heart failure     most recent echo (12/10) showed EF 55-60% with mild LVH, grade I diastolic dysfunction, mild LAE.  [name] Aortic valve disorder     Echo in 2009 showed mean aortic valve gradient of 13 mmHg, suggesting very mild stenosis. Echo (12/10) suggested aortic sclerosis only  . Obesity   . Chronic kidney disease     most recent creatinine was 1.2  . Obstructive sleep apnea     PSG 01/05/2006 AHI 24.8, CPAP 9cm H2O  . Mild asthma     PFT 02/05/10 FEV1 1.38, FEV1% 73, TLC 3.78 (86%), DLCO 48%, +BD  . ACE-inhibitor cough   .  Hypothyroidism   . Obesity hypoventilation syndrome        . Pulmonary nodule, right   . Carotid artery disease     mild, carotid dopplers 12/2009  . Myocardial infarction   . CHF (congestive heart failure)   . Renal insufficiency    Electrolyte and Renal Profile:    Recent Labs Lab 01/31/15 1857  BUN 27*  CREATININE 2.56*  NA 135  K 4.3   Glucose Profile:  Recent Labs  02/01/15 1645 02/01/15 2003 02/02/15 0725  GLUCAP 142* 128* 147*    Unsure intake prior to admission and limited documentation of intake at this time during admission.    Medications: lasix, aspart, lantus, nystatin, lactulose, senokot  Height:  Ht Readings from Last 1 Encounters:  01/31/15 5\' 2"  (1.575 m)    Weight:  Wt Readings from Last 1 Encounters:  02/02/15 236 lb 15.9 oz (107.5 kg)      Wt Readings from Last 10 Encounters:  02/02/15 236 lb 15.9 oz (107.5 kg)  02/01/15 241 lb (109.317 kg)  01/06/15 242 lb (109.77 kg)  11/06/14 243 lb 8 oz (110.451 kg)  03/14/14 251 lb (113.853 kg)  03/13/14 260 lb 4 oz (118.049 kg)  09/26/13 257 lb 8 oz (116.801 kg)  06/20/13 259 lb (117.482 kg)  03/29/13 254  lb 12 oz (115.554 kg)  09/28/12 251 lb 4 oz (113.966 kg)    BMI:  Body mass index is 43.34 kg/(m^2).  Estimated Nutritional Needs:  Kcal:  Using IBW of 50kg BEE 938 kcals (IF 1.0-1.3, AF 1.3) [id number]  Protein:  (1.2-1.5 g/d)60-75 g/d Using IBW of 50kg  Fluid:  (1026ml + urine output)  Skin:  Reviewed, no issues  Diet Order:  Diet renal/carb modified with fluid restriction Diet-HS Snack?: Nothing; Room service appropriate?: Yes; Fluid consistency:: Thin; Fluid restriction:: 1500 mL Fluid Diet Carb Modified  EDUCATION NEEDS:  No education needs identified at this time   Intake/Output Summary (Last 24 hours) at 02/02/15 1435 Last data filed at 02/02/15 1331  Gross per 24 hour  Intake      0 ml  Output   2150 ml  Net  -2150 ml    MODERATE Care Level [place] Fountaine B. Zenia Resides, [place],  [place] (pager)

## 2015-02-02 NOTE — Clinical Social Work Note (Signed)
CSW spoke to pt and pt's daughter.  Pt's daughter is in agreement with SNF placement, however pt may want to go home before going to SNF.  CSW explained that this would be more difficult.  Pt agreed to let CSW do a bed search today and f/u with her tomorrow for placement options.  Pt may refuse to go, and if this is the case CSW cannot force pt to go to SNF w/o her consent.  CSW will follow.

## 2015-02-02 NOTE — Progress Notes (Signed)
[facility] [place] NAME: Dawn Bradford    MR#:  [id number]  DATE OF BIRTH:  1936-01-02  SUBJECTIVE:  CHIEF COMPLAINT:   Chief Complaint  Patient presents with  . Aphasia    difficulty speaking x 2 days, dialysis yesterday   Still has word finding difficulty. No other concerns. HD today. REVIEW OF SYSTEMS:    ROS Review of Systems  Constitutional: Negative for fever and chills.  HENT: Negative for sore throat.  Eyes: Negative for blurred vision, double vision and pain.  Respiratory: Negative for cough, hemoptysis, shortness of breath and wheezing.  Cardiovascular: Negative for chest pain, palpitations, orthopnea and leg swelling.  Gastrointestinal: Negative for heartburn, nausea, vomiting, abdominal pain, diarrhea and constipation.  Genitourinary: Negative for dysuria and hematuria.  Musculoskeletal: Negative for back pain and joint pain.  Skin: Negative for rash.  Neurological: Negative for sensory change,  focal weakness and headaches. Positive speech changes Endo/Heme/Allergies: Does not bruise/bleed easily.  Psychiatric/Behavioral: Negative for depression. The patient is not nervous/anxious.   DRUG ALLERGIES:   Allergies  Allergen Reactions  . Ace Inhibitors     unknown  . Amlodipine Besylate     "something happened to gums"  . Clonidine Hydrochloride Other (See Comments)    Unknown reaction.  . Codeine Hives  . Metformin     REACTION: vomiting    VITALS:  Blood pressure 161/58, pulse 71, temperature 98.5 F (36.9 C), temperature source Oral, resp. rate 20, height 5\' 2"  (1.575 m), weight 107.5 kg (236 lb 15.9 oz), last menstrual period 02/01/1986, SpO2 99 %.  PHYSICAL EXAMINATION:   Physical Exam  GENERAL:  79 y.o.-year-old patient lying in the bed with no acute distress.  EYES: Pupils equal, round, reactive to light and accommodation. No scleral icterus. Extraocular muscles intact.  HEENT: Head  atraumatic, normocephalic. Oropharynx and nasopharynx clear.  NECK:  Supple, no jugular venous distention. No thyroid enlargement, no tenderness.  LUNGS: Normal breath sounds bilaterally, no wheezing, rales, rhonchi. No use of accessory muscles of respiration.  CARDIOVASCULAR: S1, S2 normal. No murmurs, rubs, or gallops.  ABDOMEN: Soft, nontender, nondistended. Bowel sounds present. No organomegaly or mass.  EXTREMITIES: No cyanosis, clubbing or edema b/l.  LUE fistula  NEUROLOGIC: Cranial nerves II through XII are intact. No focal Motor or sensory deficits b/l.  Word finding difficulty. PSYCHIATRIC: The patient is alert and oriented x 3.  SKIN: No obvious rash, lesion, or ulcer.    LABORATORY PANEL:   CBC  Recent Labs Lab 01/31/15 1857  WBC 7.8  HGB 11.2*  HCT 33.5*  PLT 142*   ------------------------------------------------------------------------------------------------------------------  Chemistries   Recent Labs Lab 01/31/15 1857 02/02/15 1039  NA 135 136  K 4.3 4.6  CL 102 103  CO2 26 27  GLUCOSE 121* 145*  BUN 27* 30*  CREATININE 2.56* 2.58*  CALCIUM 8.8* 8.8*  AST 17  --   ALT 11*  --   ALKPHOS 74  --   BILITOT 0.4  --    ------------------------------------------------------------------------------------------------------------------  Cardiac Enzymes  Recent Labs Lab 01/31/15 1857  TROPONINI <0.03   ------------------------------------------------------------------------------------------------------------------  RADIOLOGY:  Ct Angio Head W/cm &/or Wo Cm  02/02/2015   CLINICAL DATA:  Multiple acute LEFT MCA territory infarcts. Assess for proximal vascular lesion in this diabetic patient with end-stage renal disease, diabetes, hypertension, and chronic diastolic heart failure.  EXAM: CT ANGIOGRAPHY HEAD AND NECK  TECHNIQUE: Multidetector CT imaging of the head and neck was  performed using the standard protocol during bolus administration of  intravenous contrast. Multiplanar CT image reconstructions and MIPs were obtained to evaluate the vascular anatomy. Carotid stenosis measurements (when applicable) are obtained utilizing NASCET criteria, using the distal internal carotid diameter as the denominator.  CONTRAST:  166mL OMNIPAQUE IOHEXOL 350 MG/ML SOLN  COMPARISON:  MR brain 02/01/2015 CT head 01/31/2015.  FINDINGS: CT HEAD  Calvarium and skull base: No fracture or destructive lesion. Mastoids and middle ears are grossly clear.  Paranasal sinuses: Imaged portions are free of acute disease. Chronic LEFT maxillary sinus mucosal thickening is evident.  Orbits: BILATERAL cataract extraction.  No masses.  Brain: No evidence of acute abnormality, including acute hemorrhage, hydrocephalus, or mass lesion. Generalized atrophy. Chronic microvascular ischemic change. Multifocal areas of acute LEFT hemisphere infarction more readily appreciated on MR than CT, with slight developing hypodensity in the LEFT insula, and LEFT temporal cortex. No interval hemorrhage.  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 Calcific plaque great vessel origins throughout. Imaged portion shows no evidence of aneurysm or dissection. No significant stenosis of the major arch vessel origins.  Right carotid system: No evidence of dissection, stenosis (50% or greater) or occlusion.  Left carotid system: Heavily calcified plaque over a 3 cm segment of the LEFT common carotid artery. Heavily calcified plaque over a 2 cm segment of LEFT internal carotid artery, with estimated 50% stenosis (2.2/4.5 proximal/distal ratio). No soft plaque is evident No evidence of dissection, or occlusion.  Vertebral arteries: LEFT vertebral dominant. Calcific plaque the C1 level not clearly flow reducing. No evidence of dissection, stenosis (50% or greater) or occlusion.  Nonvascular tissues: Prior CABG. Moderate vascular congestion. Ground-glass  opacities at the RIGHT greater than LEFT lung apices. No pneumothorax. Vascular dialysis catheter. Chronic biapical pleural thickening. Spondylosis. No neck masses. Unremarkable thyroid.  CTA HEAD  Anterior circulation: Heavily calcified cavernous and supraclinoid RICA and LICA without flow reducing lesion. Within the proximal inferior M2 division of the LEFT middle cerebral artery, there is a ~1.5 mm intraluminal calcific embolus (image 11 series 13, image 235 series 8). This embolus does not occlude the vessel, but its caliber may be slightly diminished compared to the superior M2 division. There is no obvious M3 or M4 branch occlusion. Its central location suggests embolic origin from the LEFT common carotid artery or LEFT internal carotid artery. It is unclear if neurointerventional retrieval is possible or warranted. No similar lesions of the RIGHT MCA or either ACA.  Posterior circulation: No significant stenosis, proximal occlusion, aneurysm, or vascular malformation.  Venous sinuses: As permitted by contrast timing, patent.  Anatomic variants: None of significance.  Delayed phase:   No abnormal intracranial enhancement.  IMPRESSION: ~1.5 mm calcific embolus to the proximal inferior M2 division, LEFT MCA. This likely originated from severe calcific plaque in the LEFT common carotid artery or LEFT internal carotid artery. See discussion above.  No flow-limiting stenosis of the extracranial vasculature, although measurable luminal narrowing in the proximal LEFT ICA approaches 50%.  Foci of evolving cytotoxic edema in the LEFT hemisphere are difficult to appreciate. There is no interval hemorrhagic transformation.   Electronically Signed   By: Rolla Flatten M.D.   On: 02/02/2015 09:46   Ct Head Wo Contrast  01/31/2015   CLINICAL DATA:  Expressive aphasia  EXAM: CT HEAD WITHOUT CONTRAST  TECHNIQUE: Contiguous axial images were obtained from the base of the skull through the vertex without intravenous contrast.  COMPARISON:  10/27/2010  FINDINGS: Bony calvarium is intact. Mild atrophic changes are noted commensurate with patient's given age. No findings to suggest acute hemorrhage, acute infarction or space-occupying mass lesion are noted. Stable lacunar infarct is noted in the left basal ganglia.  IMPRESSION: No acute abnormality noted.   Electronically Signed   By: Inez Catalina M.D.   On: 01/31/2015 18:07   Ct Angio Neck W/cm &/or Wo/cm  02/02/2015   CLINICAL DATA:  Multiple acute LEFT MCA territory infarcts. Assess for proximal vascular lesion in this diabetic patient with end-stage renal disease, diabetes, hypertension, and chronic diastolic heart failure.  EXAM: CT ANGIOGRAPHY HEAD AND NECK  TECHNIQUE: Multidetector CT imaging of the head and neck was performed using the standard protocol during bolus administration of intravenous contrast. Multiplanar CT image reconstructions and MIPs were obtained to evaluate the vascular anatomy. Carotid stenosis measurements (when applicable) are obtained utilizing NASCET criteria, using the distal internal carotid diameter as the denominator.  CONTRAST:  172mL OMNIPAQUE IOHEXOL 350 MG/ML SOLN  COMPARISON:  MR brain 02/01/2015 CT head 01/31/2015.  FINDINGS: CT HEAD  Calvarium and skull base: No fracture or destructive lesion. Mastoids and middle ears are grossly clear.  Paranasal sinuses: Imaged portions are free of acute disease. Chronic LEFT maxillary sinus mucosal thickening is evident.  Orbits: BILATERAL cataract extraction.  No masses.  Brain: No evidence of acute abnormality, including acute hemorrhage, hydrocephalus, or mass lesion. Generalized atrophy. Chronic microvascular ischemic change. Multifocal areas of acute LEFT hemisphere infarction more readily appreciated on MR than CT, with slight developing hypodensity in the LEFT insula, and LEFT temporal cortex. No interval hemorrhage.  CTA NECK  Aortic arch: Anomalous branching pattern with origin of LEFT vertebral  directly from the arch. Calcific plaque great vessel origins throughout. Imaged portion shows no evidence of aneurysm or dissection. No significant stenosis of the major arch vessel origins.  Right carotid system: No evidence of dissection, stenosis (50% or greater) or occlusion.  Left carotid system: Heavily calcified plaque over a 3 cm segment of the LEFT common carotid artery. Heavily calcified plaque over a 2 cm segment of LEFT internal carotid artery, with estimated 50% stenosis (2.2/4.5 proximal/distal ratio). No soft plaque is evident No evidence of dissection, or occlusion.  Vertebral arteries: LEFT vertebral dominant. Calcific plaque the C1 level not clearly flow reducing. No evidence of dissection, stenosis (50% or greater) or occlusion.  Nonvascular tissues: Prior CABG. Moderate vascular congestion. Ground-glass opacities at the RIGHT greater than LEFT lung apices. No pneumothorax. Vascular dialysis catheter. Chronic biapical pleural thickening. Spondylosis. No neck masses. Unremarkable thyroid.  CTA HEAD  Anterior circulation: Heavily calcified cavernous and supraclinoid RICA and LICA without flow reducing lesion. Within the proximal inferior M2 division of the LEFT middle cerebral artery, there is a ~1.5 mm intraluminal calcific embolus (image 11 series 13, image 235 series 8). This embolus does not occlude the vessel, but its caliber may be slightly diminished compared to the superior M2 division. There is no obvious M3 or M4 branch occlusion. Its central location suggests embolic origin from the LEFT common carotid artery or LEFT internal carotid artery. It is unclear if neurointerventional retrieval is possible or warranted. No similar lesions of the RIGHT MCA or either ACA.  Posterior circulation: No significant stenosis, proximal occlusion, aneurysm, or vascular malformation.  Venous sinuses: As permitted by contrast timing, patent.  Anatomic variants: None of significance.  Delayed phase:   No  abnormal intracranial enhancement.  IMPRESSION: ~1.5 mm calcific embolus  to the proximal inferior M2 division, LEFT MCA. This likely originated from severe calcific plaque in the LEFT common carotid artery or LEFT internal carotid artery. See discussion above.  No flow-limiting stenosis of the extracranial vasculature, although measurable luminal narrowing in the proximal LEFT ICA approaches 50%.  Foci of evolving cytotoxic edema in the LEFT hemisphere are difficult to appreciate. There is no interval hemorrhagic transformation.   Electronically Signed   By: Rolla Flatten M.D.   On: 02/02/2015 09:46   Mr Brain Wo Contrast  02/01/2015   CLINICAL DATA:  Patient developed aphasia 3-4 days ago. No reported RIGHT-sided weakness. Stroke risk factors include ESRD, hypertension, and diabetes.  EXAM: MRI HEAD WITHOUT CONTRAST  TECHNIQUE: Multiplanar, multiecho pulse sequences of the brain and surrounding structures were obtained without intravenous contrast.  COMPARISON:  CT head 01/31/2015 was unremarkable. Carotid Dopplers 02/01/2015 suggests BILATERAL non stenotic plaque.  FINDINGS: Multifocal areas of restricted diffusion throughout the LEFT hemisphere involving the temporal lobe, insula, frontal lobe, and parietal lobe, as well as the adjacent subcortical white matter, LEFT MCA territory distribution, non-confluent and nonhemorrhagic, suggesting a shower of emboli. No RIGHT-sided, brainstem, or cerebellar abnormalities.  No mass lesion, hydrocephalus, or extra-axial fluid.  Generalized atrophy. Moderately advanced T2 and FLAIR hyperintensity throughout the white matter suggesting small vessel disease. No midline abnormalities. Flow voids are maintained. No evidence for large vessel occlusion.  Extracranial soft tissues unremarkable.  Compared with prior CT, these acute infarcts are not visible.  IMPRESSION: Multifocal areas of restricted diffusion, non-confluent and nonhemorrhagic throughout the LEFT hemisphere, LEFT  MCA territory distribution, consistent with a shower of emboli.  Atrophy and small vessel disease.  No evidence for large vessel occlusion.   Electronically Signed   By: Rolla Flatten M.D.   On: 02/01/2015 12:23   US Carotid Bilateral  02/01/2015   CLINICAL DATA:  Stroke.  Hypertension, diabetes.  EXAM: BILATERAL CAROTID DUPLEX ULTRASOUND  TECHNIQUE: Pearline Cables scale imaging, color Doppler and duplex ultrasound was performed of bilateral carotid and vertebral arteries in the neck.  COMPARISON:  None.  REVIEW OF SYSTEMS: Quantification of carotid stenosis is based on velocity parameters that correlate the residual internal carotid diameter with NASCET-based stenosis levels, using the diameter of the distal internal carotid lumen as the denominator for stenosis measurement.  The following velocity measurements were obtained:  PEAK SYSTOLIC/END DIASTOLIC  RIGHT  ICA:                     93/14cm/sec  CCA:                     [id number]  SYSTOLIC ICA/CCA RATIO:  1.0  DIASTOLIC ICA/CCA RATIO: 1.0  ECA:                     127cm/sec  LEFT  ICA:                     93/15cm/sec  CCA:                     0000000  SYSTOLIC ICA/CCA RATIO:  1.0  DIASTOLIC ICA/CCA RATIO: 1.3  ECA:                     75cm/sec  FINDINGS: RIGHT CAROTID ARTERY: Eccentric plaque in the distal common carotid artery, bulb, and proximal ICA, without high-grade stenosis. Normal waveforms and color Doppler signal. ICA is tortuous.  RIGHT VERTEBRAL ARTERY:  Normal flow direction and waveform.  LEFT CAROTID ARTERY: Calcified plaque in the mid common carotid artery resulting in at least mild stenosis. Eccentric calcified plaque in the distal common carotid, bulb, and proximal ICA with mild stenosis. Normal waveforms and color Doppler signal. ICA mildly tortuous.  LEFT VERTEBRAL ARTERY: Normal flow direction and waveform.  IMPRESSION: 1. Bilateral carotid bifurcation and proximal ICA plaque resulting in less than 50% diameter stenosis. The exam does not  exclude plaque ulceration or embolization. Continued surveillance recommended.   Electronically Signed   By: Lucrezia Europe M.D.   On: 02/01/2015 11:39     ASSESSMENT AND PLAN:   #1 Acute L MCA multiple emboli On ASA, Statin. Started on diet.  CTA showed L MCA narrowing. WIll need dual anti-platelet therapy . ASA, Plavix. No intervention needed.  #2 end-stage renal disease on hemodialysis: Nephrology consultation.  #3 diabetes mellitus type 2: Check a hemoglobin A1c. Continue Lantus 38 units, 10 units of NovoLog with meals, sliding scale  #4 hypertension: Permissive HTN for CVA  #5 chronic diastolic heart failure:  Euvolemic. Continue HD     All the records are reviewed and case discussed with Care Management/Social Workerr. Management plans discussed with the patient, family and they are in agreement.  CODE STATUS: FULL CODE  DVT Prophylaxis: Lovenox  TOTAL TIME TAKING CARE OF THIS PATIENT: 40 minutes.   Family wants patient to go to rehab. Pt seems to have walked well with PT and HH recommended. SW looking for rehab. Will have to see if she qualifies.   Hillary Bow R M.D on 02/02/2015 at 4:15 PM  Between 7am to 6pm - Pager - [phone]  After 6pm go to [email or web address] - password [email or web address] [facility]  Office  [phone]  CC: Primary care physician; Hortencia Pilar, MD

## 2015-02-02 NOTE — Progress Notes (Signed)
Patient compliant with CPAP, no c/o pain or any acute distress noted. IV  20g  on the lateral side of the forearm in place for CT angiogram. Patient refused to go at 1200 midnight and prefers  CT to be done today. Family at bedside, no issues. Patient's CBG last night was 128 mg/dL. Lantus 28 units was scheduled to be administered but since patient refused to eat snack at bedtime, Lantus was held per DR. Willis order.

## 2015-02-02 NOTE — Progress Notes (Signed)
[facility]  ROUNDING NOTE   Subjective:  Pt seen during dialysis.  BFR 400.  Aphasia seems to have improved.   Objective:  Vital signs in last 24 hours:  Temp:  [97.6 F (36.4 C)-98.2 F (36.8 C)] 97.8 F (36.6 C) (05/30 0958) Pulse Rate:  [59-72] 65 (05/30 1100) Resp:  [16-24] 16 (05/30 1100) BP: (144-177)/(61-77) 172/64 mmHg (05/30 1100) SpO2:  [94 %-98 %] 98 % (05/30 1100) Weight:  [109.09 kg (240 lb 8 oz)] 109.09 kg (240 lb 8 oz) (05/30 0940)  Weight change: -4.309 kg (-9 lb 8 oz) Filed Weights   01/31/15 2227 02/02/15 0442 02/02/15 0940  Weight: 109.453 kg (241 lb 4.8 oz) 109.09 kg (240 lb 8 oz) 109.09 kg (240 lb 8 oz)    Intake/Output: I/O last 3 completed shifts: In: -  Out: 151 [Urine:151]   Intake/Output this shift:     Physical Exam: General: NAD  Head: Normocephalic, atraumatic. Moist oral mucosal membranes  Eyes: Anicteric, PERRL  Neck: Supple, trachea midline  Lungs:  Clear to auscultation normal effort  Heart: Regular rate and rhythm no rubs or murmurs  Abdomen:  Soft, nontender, BS present  Extremities:  trace peripheral edema.  Neurologic: Awake, alert, strength 5/5 in both upper and lower extremeties, aphasia seems improved  Skin: No lesions  Access: LUE AVF    Basic Metabolic Panel:  Recent Labs Lab 01/31/15 1857  NA 135  K 4.3  CL 102  CO2 26  GLUCOSE 121*  BUN 27*  CREATININE 2.56*  CALCIUM 8.8*    Liver Function Tests:  Recent Labs Lab 01/31/15 1857  AST 17  ALT 11*  ALKPHOS 74  BILITOT 0.4  PROT 6.2*  ALBUMIN 3.4*   No results for input(s): LIPASE, AMYLASE in the last 168 hours. No results for input(s): AMMONIA in the last 168 hours.  CBC:  Recent Labs Lab 01/31/15 1857  WBC 7.8  NEUTROABS 4.8  HGB 11.2*  HCT 33.5*  MCV 98.2  PLT 142*    Cardiac Enzymes:  Recent Labs Lab 01/31/15 1857  TROPONINI <0.03    BNP: Invalid input(s): POCBNP  CBG:  Recent Labs Lab 02/01/15 0734  02/01/15 1217 02/01/15 1645 02/01/15 2003 02/02/15 0725  GLUCAP 118* 88 142* 128* 147*    Microbiology: No results found for this or any previous visit.  Coagulation Studies: No results for input(s): LABPROT, INR in the last 72 hours.  Urinalysis:  Recent Labs  01/31/15 1920  COLORURINE YELLOW*  LABSPEC 1.016  PHURINE 5.0  GLUCOSEU NEGATIVE  HGBUR NEGATIVE  BILIRUBINUR NEGATIVE  KETONESUR NEGATIVE  PROTEINUR 30*  NITRITE NEGATIVE  LEUKOCYTESUR NEGATIVE      Imaging: Ct Angio Head W/cm &/or Wo Cm  02/02/2015   CLINICAL DATA:  Multiple acute LEFT MCA territory infarcts. Assess for proximal vascular lesion in this diabetic patient with end-stage renal disease, diabetes, hypertension, and chronic diastolic heart failure.  EXAM: CT ANGIOGRAPHY HEAD AND NECK  TECHNIQUE: Multidetector CT imaging of the head and neck was performed using the standard protocol during bolus administration of intravenous contrast. Multiplanar CT image reconstructions and MIPs were obtained to evaluate the vascular anatomy. Carotid stenosis measurements (when applicable) are obtained utilizing NASCET criteria, using the distal internal carotid diameter as the denominator.  CONTRAST:  136mL OMNIPAQUE IOHEXOL 350 MG/ML SOLN  COMPARISON:  MR brain 02/01/2015 CT head 01/31/2015.  FINDINGS: CT HEAD  Calvarium and skull base: No fracture or destructive lesion. Mastoids and middle ears are  grossly clear.  Paranasal sinuses: Imaged portions are free of acute disease. Chronic LEFT maxillary sinus mucosal thickening is evident.  Orbits: BILATERAL cataract extraction.  No masses.  Brain: No evidence of acute abnormality, including acute hemorrhage, hydrocephalus, or mass lesion. Generalized atrophy. Chronic microvascular ischemic change. Multifocal areas of acute LEFT hemisphere infarction more readily appreciated on MR than CT, with slight developing hypodensity in the LEFT insula, and LEFT temporal cortex. No interval  hemorrhage.  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 Calcific plaque great vessel origins throughout. Imaged portion shows no evidence of aneurysm or dissection. No significant stenosis of the major arch vessel origins.  Right carotid system: No evidence of dissection, stenosis (50% or greater) or occlusion.  Left carotid system: Heavily calcified plaque over a 3 cm segment of the LEFT common carotid artery. Heavily calcified plaque over a 2 cm segment of LEFT internal carotid artery, with estimated 50% stenosis (2.2/4.5 proximal/distal ratio). No soft plaque is evident No evidence of dissection, or occlusion.  Vertebral arteries: LEFT vertebral dominant. Calcific plaque the C1 level not clearly flow reducing. No evidence of dissection, stenosis (50% or greater) or occlusion.  Nonvascular tissues: Prior CABG. Moderate vascular congestion. Ground-glass opacities at the RIGHT greater than LEFT lung apices. No pneumothorax. Vascular dialysis catheter. Chronic biapical pleural thickening. Spondylosis. No neck masses. Unremarkable thyroid.  CTA HEAD  Anterior circulation: Heavily calcified cavernous and supraclinoid RICA and LICA without flow reducing lesion. Within the proximal inferior M2 division of the LEFT middle cerebral artery, there is a ~1.5 mm intraluminal calcific embolus (image 11 series 13, image 235 series 8). This embolus does not occlude the vessel, but its caliber may be slightly diminished compared to the superior M2 division. There is no obvious M3 or M4 branch occlusion. Its central location suggests embolic origin from the LEFT common carotid artery or LEFT internal carotid artery. It is unclear if neurointerventional retrieval is possible or warranted. No similar lesions of the RIGHT MCA or either ACA.  Posterior circulation: No significant stenosis, proximal occlusion, aneurysm, or vascular malformation.  Venous sinuses: As permitted  by contrast timing, patent.  Anatomic variants: None of significance.  Delayed phase:   No abnormal intracranial enhancement.  IMPRESSION: ~1.5 mm calcific embolus to the proximal inferior M2 division, LEFT MCA. This likely originated from severe calcific plaque in the LEFT common carotid artery or LEFT internal carotid artery. See discussion above.  No flow-limiting stenosis of the extracranial vasculature, although measurable luminal narrowing in the proximal LEFT ICA approaches 50%.  Foci of evolving cytotoxic edema in the LEFT hemisphere are difficult to appreciate. There is no interval hemorrhagic transformation.   Electronically Signed   By: Rolla Flatten M.D.   On: 02/02/2015 09:46   Ct Head Wo Contrast  01/31/2015   CLINICAL DATA:  Expressive aphasia  EXAM: CT HEAD WITHOUT CONTRAST  TECHNIQUE: Contiguous axial images were obtained from the base of the skull through the vertex without intravenous contrast.  COMPARISON:  10/27/2010  FINDINGS: Bony calvarium is intact. Mild atrophic changes are noted commensurate with patient's given age. No findings to suggest acute hemorrhage, acute infarction or space-occupying mass lesion are noted. Stable lacunar infarct is noted in the left basal ganglia.  IMPRESSION: No acute abnormality noted.   Electronically Signed   By: Inez Catalina M.D.   On: 01/31/2015 18:07   Ct Angio Neck W/cm &/or Wo/cm  02/02/2015   CLINICAL DATA:  Multiple acute LEFT MCA territory infarcts. Assess for proximal vascular lesion in this diabetic patient with end-stage renal disease, diabetes, hypertension, and chronic diastolic heart failure.  EXAM: CT ANGIOGRAPHY HEAD AND NECK  TECHNIQUE: Multidetector CT imaging of the head and neck was performed using the standard protocol during bolus administration of intravenous contrast. Multiplanar CT image reconstructions and MIPs were obtained to evaluate the vascular anatomy. Carotid stenosis measurements (when applicable) are obtained utilizing  NASCET criteria, using the distal internal carotid diameter as the denominator.  CONTRAST:  116mL OMNIPAQUE IOHEXOL 350 MG/ML SOLN  COMPARISON:  MR brain 02/01/2015 CT head 01/31/2015.  FINDINGS: CT HEAD  Calvarium and skull base: No fracture or destructive lesion. Mastoids and middle ears are grossly clear.  Paranasal sinuses: Imaged portions are free of acute disease. Chronic LEFT maxillary sinus mucosal thickening is evident.  Orbits: BILATERAL cataract extraction.  No masses.  Brain: No evidence of acute abnormality, including acute hemorrhage, hydrocephalus, or mass lesion. Generalized atrophy. Chronic microvascular ischemic change. Multifocal areas of acute LEFT hemisphere infarction more readily appreciated on MR than CT, with slight developing hypodensity in the LEFT insula, and LEFT temporal cortex. No interval hemorrhage.  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 Calcific plaque great vessel origins throughout. Imaged portion shows no evidence of aneurysm or dissection. No significant stenosis of the major arch vessel origins.  Right carotid system: No evidence of dissection, stenosis (50% or greater) or occlusion.  Left carotid system: Heavily calcified plaque over a 3 cm segment of the LEFT common carotid artery. Heavily calcified plaque over a 2 cm segment of LEFT internal carotid artery, with estimated 50% stenosis (2.2/4.5 proximal/distal ratio). No soft plaque is evident No evidence of dissection, or occlusion.  Vertebral arteries: LEFT vertebral dominant. Calcific plaque the C1 level not clearly flow reducing. No evidence of dissection, stenosis (50% or greater) or occlusion.  Nonvascular tissues: Prior CABG. Moderate vascular congestion. Ground-glass opacities at the RIGHT greater than LEFT lung apices. No pneumothorax. Vascular dialysis catheter. Chronic biapical pleural thickening. Spondylosis. No neck masses. Unremarkable thyroid.   CTA HEAD  Anterior circulation: Heavily calcified cavernous and supraclinoid RICA and LICA without flow reducing lesion. Within the proximal inferior M2 division of the LEFT middle cerebral artery, there is a ~1.5 mm intraluminal calcific embolus (image 11 series 13, image 235 series 8). This embolus does not occlude the vessel, but its caliber may be slightly diminished compared to the superior M2 division. There is no obvious M3 or M4 branch occlusion. Its central location suggests embolic origin from the LEFT common carotid artery or LEFT internal carotid artery. It is unclear if neurointerventional retrieval is possible or warranted. No similar lesions of the RIGHT MCA or either ACA.  Posterior circulation: No significant stenosis, proximal occlusion, aneurysm, or vascular malformation.  Venous sinuses: As permitted by contrast timing, patent.  Anatomic variants: None of significance.  Delayed phase:   No abnormal intracranial enhancement.  IMPRESSION: ~1.5 mm calcific embolus to the proximal inferior M2 division, LEFT MCA. This likely originated from severe calcific plaque in the LEFT common carotid artery or LEFT internal carotid artery. See discussion above.  No flow-limiting stenosis of the extracranial vasculature, although measurable luminal narrowing in the proximal LEFT ICA approaches 50%.  Foci of evolving cytotoxic edema in the LEFT hemisphere are difficult to appreciate. There is no interval hemorrhagic transformation.   Electronically Signed   By: Michae Kava.D.  On: 02/02/2015 09:46   Mr Brain Wo Contrast  02/01/2015   CLINICAL DATA:  Patient developed aphasia 3-4 days ago. No reported RIGHT-sided weakness. Stroke risk factors include ESRD, hypertension, and diabetes.  EXAM: MRI HEAD WITHOUT CONTRAST  TECHNIQUE: Multiplanar, multiecho pulse sequences of the brain and surrounding structures were obtained without intravenous contrast.  COMPARISON:  CT head 01/31/2015 was unremarkable. Carotid  Dopplers 02/01/2015 suggests BILATERAL non stenotic plaque.  FINDINGS: Multifocal areas of restricted diffusion throughout the LEFT hemisphere involving the temporal lobe, insula, frontal lobe, and parietal lobe, as well as the adjacent subcortical white matter, LEFT MCA territory distribution, non-confluent and nonhemorrhagic, suggesting a shower of emboli. No RIGHT-sided, brainstem, or cerebellar abnormalities.  No mass lesion, hydrocephalus, or extra-axial fluid.  Generalized atrophy. Moderately advanced T2 and FLAIR hyperintensity throughout the white matter suggesting small vessel disease. No midline abnormalities. Flow voids are maintained. No evidence for large vessel occlusion.  Extracranial soft tissues unremarkable.  Compared with prior CT, these acute infarcts are not visible.  IMPRESSION: Multifocal areas of restricted diffusion, non-confluent and nonhemorrhagic throughout the LEFT hemisphere, LEFT MCA territory distribution, consistent with a shower of emboli.  Atrophy and small vessel disease.  No evidence for large vessel occlusion.   Electronically Signed   By: Rolla Flatten M.D.   On: 02/01/2015 12:23   US Carotid Bilateral  02/01/2015   CLINICAL DATA:  Stroke.  Hypertension, diabetes.  EXAM: BILATERAL CAROTID DUPLEX ULTRASOUND  TECHNIQUE: Pearline Cables scale imaging, color Doppler and duplex ultrasound was performed of bilateral carotid and vertebral arteries in the neck.  COMPARISON:  None.  REVIEW OF SYSTEMS: Quantification of carotid stenosis is based on velocity parameters that correlate the residual internal carotid diameter with NASCET-based stenosis levels, using the diameter of the distal internal carotid lumen as the denominator for stenosis measurement.  The following velocity measurements were obtained:  PEAK SYSTOLIC/END DIASTOLIC  RIGHT  ICA:                     93/14cm/sec  CCA:                     [id number]  SYSTOLIC ICA/CCA RATIO:  1.0  DIASTOLIC ICA/CCA RATIO: 1.0  ECA:                      127cm/sec  LEFT  ICA:                     93/15cm/sec  CCA:                     0000000  SYSTOLIC ICA/CCA RATIO:  1.0  DIASTOLIC ICA/CCA RATIO: 1.3  ECA:                     75cm/sec  FINDINGS: RIGHT CAROTID ARTERY: Eccentric plaque in the distal common carotid artery, bulb, and proximal ICA, without high-grade stenosis. Normal waveforms and color Doppler signal. ICA is tortuous.  RIGHT VERTEBRAL ARTERY:  Normal flow direction and waveform.  LEFT CAROTID ARTERY: Calcified plaque in the mid common carotid artery resulting in at least mild stenosis. Eccentric calcified plaque in the distal common carotid, bulb, and proximal ICA with mild stenosis. Normal waveforms and color Doppler signal. ICA mildly tortuous.  LEFT VERTEBRAL ARTERY: Normal flow direction and waveform.  IMPRESSION: 1. Bilateral carotid bifurcation and proximal ICA plaque resulting in less than 50% diameter stenosis. The exam does  not exclude plaque ulceration or embolization. Continued surveillance recommended.   Electronically Signed   By: Lucrezia Europe M.D.   On: 02/01/2015 11:39     Medications:     . aspirin  81 mg Oral Daily  . carvedilol  25 mg Oral BID WC  . clopidogrel  75 mg Oral Daily  . FLUoxetine  10 mg Oral Daily  . furosemide  40 mg Oral Daily  . heparin  5,000 Units Subcutaneous 3 times per day  . hydrALAZINE  25 mg Oral TID  . insulin aspart  0-5 Units Subcutaneous QHS  . insulin aspart  0-9 Units Subcutaneous TID WC  . insulin aspart  6 Units Subcutaneous TID WC  . insulin glargine  38 Units Subcutaneous QHS  . levothyroxine  75 mcg Oral QAC breakfast  . nystatin   Topical BID  . rosuvastatin  40 mg Oral QHS  . traZODone  50 mg Oral QHS   sodium chloride, sodium chloride, acetaminophen **OR** acetaminophen, alteplase, feeding supplement (NEPRO CARB STEADY), heparin, labetalol, lactulose, lidocaine (PF), lidocaine-prilocaine, pentafluoroprop-tetrafluoroeth, senna-docusate, triamcinolone cream  Assessment/  Plan:  79 y.o. female with past medical history of ESRD on HD MWF, DM-2, Hypertension, hyperlipidemia, renal artery stenosis, peripheral vascular disease, obstructive sleep apena, hypothyroidism, carotid artery disease, history of myocardial infarction, anemia of CKD, SHPTH, hypothyroidism  1.  ESRD on HD: Pt seen during HD, tolerating well, UF target 2kg. 2.  Anemia of CKD:  Will hold epogen for now given embolic CVA. 3.  SHPTH: phos pending today. 4.  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  CT angiogram reviewed, would consider vascular surgery consultation for this issue as well given plaque in L ICA.   LOS: 2 Perrin Eddleman 5/30/201611:58 AM

## 2015-02-02 NOTE — Progress Notes (Signed)
Patient tolerated dialysis without complications. 16g needles used on new fistula.  2072ml fluid removal.

## 2015-02-02 NOTE — Progress Notes (Signed)
PT Cancellation Note  Patient Details Name: Dawn Bradford MRN: [id number] DOB: [birth year]   Cancelled Treatment:     PT attempted to see patient this am x2, she was in CT scan at 8:48 and then in Dialysis at 10:00; will re-attempt PT treatment as soon as patient is available.   Hopkins,Sinan Tuch, PT, DPT 02/02/2015, 10:46 AM

## 2015-02-02 NOTE — Care Management (Signed)
Daughter voices concern of patient returning home directly from hospital.  She was able to ambulate on her own with her walker, toilet herself and groom.  Now she is not able to do so.  She is now incontinent of urine and this is new.   Interested in pursuing short term skilled nursing placement.  CSW referral

## 2015-02-03 DIAGNOSIS — R4701 Aphasia: Secondary | ICD-10-CM | POA: Diagnosis present

## 2015-02-03 DIAGNOSIS — I6529 Occlusion and stenosis of unspecified carotid artery: Secondary | ICD-10-CM | POA: Diagnosis present

## 2015-02-03 LAB — GLUCOSE, CAPILLARY
GLUCOSE-CAPILLARY: 89 mg/dL (ref 65–99)
GLUCOSE-CAPILLARY: 112 mg/dL — AB (ref 65–99)
Glucose-Capillary: 122 mg/dL — ABNORMAL HIGH (ref 65–99)
Glucose-Capillary: 97 mg/dL (ref 65–99)

## 2015-02-03 LAB — HEMOGLOBIN A1C: HEMOGLOBIN A1C: 6.1 % — AB (ref 4.0–6.0)

## 2015-02-03 MED ORDER — CLOPIDOGREL BISULFATE 75 MG PO TABS: 75.0000 mg | ORAL_TABLET | Freq: Every day | ORAL | Status: DC

## 2015-02-03 MED ORDER — LIDOCAINE-PRILOCAINE 2.5-2.5 % EX CREA: 1.0000 "application " | TOPICAL_CREAM | CUTANEOUS | Status: DC | PRN

## 2015-02-03 MED ORDER — NEPRO/CARBSTEADY PO LIQD: 237.0000 mL | Freq: Two times a day (BID) | ORAL | Status: DC

## 2015-02-03 NOTE — Progress Notes (Signed)
Speech Language Pathology Treatment: Dysphagia  Patient Details Name: ACIE VENABLES MRN: [id number] DOB: March 31, 1936 Today's Date: 02/03/2015 Time: [id number] SLP Time Calculation (min) (ACUTE ONLY): 41 min  Assessment / Plan / Recommendation Clinical Impression  Pt appears to be tolerating her current mech soft diet w/ thin liquids w/ no overt s/s of aspiration noted by staff or pt/family. Pt is eating at each meal; she stated her stomach is less upset now. Pt is able to follow verbal cues and general aspiration precautions. Pt continues to present w/ expressive language deficits; rec. f/u at SNF for full assessment and poc to address. Full education had w/ pt and Dtr this morning on aspiration precautions and general expressive language generation/cues.     HPI Other Pertinent Information: Pt and family reported expressive language deficits for ~2-3 days prior to admission; this has improved somewhat but hesitations and min. word finding deficits noted w/ NEW information. Pt and family denied any noted difficulties swallowing during the meals currently; no noted coughing or choking. Pt has acknowledged pills can be difficult to swallow.   Pertinent Vitals Pain Assessment: No/denies pain  SLP Plan  Continue with current plan of care (f/u at SNF or [facility] w/ ST services for language)    Recommendations Diet recommendations: Dysphagia 3 (mechanical soft);Thin liquid Liquids provided via: Cup;Straw Medication Administration: Whole meds with puree Supervision: Patient able to self feed (setup assistance) Compensations: Slow rate;Small sips/bites Postural Changes and/or Swallow Maneuvers: Seated upright 90 degrees              General recommendations: Rehab consult (dietician f/u as [facility].) Oral Care Recommendations: Oral care BID;Oral care before and after PO (staff to assist w/ oral care) Follow up Recommendations: Skilled Nursing facility Plan: Continue with current plan of care (f/u at [facility] or [facility]  w/ ST services for language)    GO     Watson,Katherine 02/03/2015, 2:44 PM

## 2015-02-03 NOTE — Discharge Summary (Signed)
[facility] NAME: Dawn Bradford    MR#:  [id number]  DATE OF BIRTH:  [birth year]  DATE OF ADMISSION:  01/31/2015 ADMITTING PHYSICIAN: Aldean Jewett, MD  DATE OF DISCHARGE: No discharge date for patient encounter.  PRIMARY CARE PHYSICIAN: Hortencia Pilar, MD     ADMISSION DIAGNOSIS:  Aphasia [R47.01] Dysphasia [R47.02] CVA (cerebral infarction) [I63.9] Hypertensive urgency [I10]  DISCHARGE DIAGNOSIS:  Principal Problem:   Expressive aphasia Active Problems:   CVA (cerebral infarction)   Carotid stenosis   Essential hypertension   DIASTOLIC HEART FAILURE, CHRONIC   Diabetes mellitus type 2 in obese   End stage renal disease   SECONDARY DIAGNOSIS:   Past Medical History  Diagnosis Date  . Hyperlipidemia   . Hypertension     resistant hyptertension times many years. the patient does have renal artery stenosis. she has tried calcium channel blockers in the past and states that she would not take them now because she had some problems with her gums which her dentist identified as calcium-channel blocker side effects.  . Diabetes mellitus     type 2  . Coronary artery disease     s/p anterior MI in 1996 followed by CABG. Lexiscan myoview (4/11): EF 67%, normal perfusion with no evidence for ischemia or infarction.   . Renal artery stenosis     The patient has an occluded right renal artery and an atrophic right kidney. There is 20% left renal artery stenosis. This was seen by catheterization in 2007  . Diastolic heart failure     most recent echo (12/10) showed EF 55-60% with mild LVH, grade I diastolic dysfunction, mild LAE.  [name] Aortic valve disorder     Echo in 2009 showed mean aortic valve gradient of 13 mmHg, suggesting very mild stenosis. Echo (12/10) suggested aortic sclerosis only  . Obesity   . Chronic kidney disease     most recent creatinine was 1.2  . Obstructive sleep apnea     PSG 01/05/2006 AHI 24.8,  CPAP 9cm H2O  . Mild asthma     PFT 02/05/10 FEV1 1.38, FEV1% 73, TLC 3.78 (86%), DLCO 48%, +BD  . ACE-inhibitor cough   . Hypothyroidism   . Obesity hypoventilation syndrome        . Pulmonary nodule, right   . Carotid artery disease     mild, carotid dopplers 12/2009  . Myocardial infarction   . CHF (congestive heart failure)   . Renal insufficiency     .pro HOSPITAL COURSE:  Patient is 79 year old Caucasian female with history of hypertension, hyperlipidemia, sleep apnea, obesity hypoventilation syndrome who presents to the hospital with complaints of difficulty speaking for the past 2 days. She was admitted for stroke evaluation. Initial CT scan of the head showed no acute abnormality. Patient did have carotid ultrasound which revealed right ICA plaque resulting in less than 50% diameter stenosis and bilateral carotid bifurcation stenosis, also less than 50%. Patient was seen by Dr. Irish Elders, neurologist, who recommended to advance her aspirin to 375 g daily and start her on dual therapy with aspirin and Plavix for 90 days if she was seen to have significant MCA stenosis on one head and neck CTA. Approximately 1.5 mm calcific embolus in the proximal internal and 2, division of left MCA was found which was felt to be originating from severe calcific plaque in left common carotid artery or left internal carotid artery. No flow limiting stenosis of the  extracranial vasculature was noted, although measurable luminal narrowing in the proximal left ICA approached 50%. Patient was seen by speech therapist while she is she was in the hospital and dysphagia 3 diet with nectar thick liquids were recommended with medications and given in applesauce. Patient did well with this consistency. She also worked with physical therapist who recommended skilled nursing facility placement for rehabilitation purposes for which patient's family was agreeable. It was felt the patient is stable to be discharged to skilled  nursing facility today on 02/03/2015. Vision is to continue dual therapy with aspirin and Plavix for the next 90 days and follow-up with neurologist as well as primary care physician as outpatient. She was scheduled also to be seen by vascular surgeon while she was in the hospital. However, if vascular surgery consultation is not available today, patient would benefit from vascular evaluation as outpatient. Patient's lipid panel was checked while she was in the hospital and cholesterol level was found to be 122, triglycerides were 68, atrial cholesterol was 43 and LDL was 65, patient was recommended to continue current statin dose.   DISCHARGE CONDITIONS:    fair CONSULTS OBTAINED:  Treatment Team:  Leotis Pain, MD Anthonette Legato, MD Theodoro Grist, MD  DRUG ALLERGIES:   Allergies  Allergen Reactions  . Ace Inhibitors     unknown  . Amlodipine Besylate     "something happened to gums"  . Clonidine Hydrochloride Other (See Comments)    Unknown reaction.  . Codeine Hives  . Metformin     REACTION: vomiting    DISCHARGE MEDICATIONS:   Current Discharge Medication List    START taking these medications   Details  aspirin 325 MG tablet Take 1 tablet (325 mg total) by mouth daily. Qty: 30 tablet, Refills: 0    clopidogrel (PLAVIX) 75 MG tablet Take 1 tablet (75 mg total) by mouth daily. Qty: 30 tablet, Refills: 2    lidocaine-prilocaine (EMLA) cream Apply 1 application topically as needed (topical anesthesia for hemodialysis if Gebauers and Lidocaine injection are ineffective.). Qty: 30 g, Refills: 0    Nutritional Supplements (FEEDING SUPPLEMENT, NEPRO CARB STEADY,) LIQD Take 237 mLs by mouth 2 (two) times daily between meals. Qty: 60 Can, Refills: 3      CONTINUE these medications which have NOT CHANGED   Details  carvedilol (COREG) 25 MG tablet TAKE ONE TABLET BY MOUTH TWICE DAILY WITH MEALS Qty: 180 tablet, Refills: 3    Cholecalciferol (D 5000 PO) Take 5,000 Int'l  Units by mouth daily.     FLUoxetine (PROZAC) 10 MG capsule Take 10 mg by mouth at bedtime. Refills: 3    furosemide (LASIX) 40 MG tablet Take 40 mg by mouth. Takes 1 tablet on Tuesday, Thursday, Saturday and Sundays.    HUMALOG KWIKPEN 100 UNIT/ML SOPN Inject 10 Units into the skin 2 (two) times daily before a meal. Use as directed as needed depending on blood sugar.    hydrALAZINE (APRESOLINE) 25 MG tablet Take 25 mg by mouth 3 (three) times daily.      Insulin Glargine (LANTUS) 100 UNIT/ML Solostar Pen Inject 38 Units into the skin at bedtime.    LACTULOSE PO Take 15 mLs by mouth as needed (for constipation.).     levothyroxine (SYNTHROID, LEVOTHROID) 75 MCG tablet Take 75 mcg by mouth daily before breakfast.    nystatin (MYCOSTATIN/NYSTOP) 100000 UNIT/GM POWD Apply 100,000 g topically daily as needed (for rash and redness.).     rosuvastatin (CRESTOR) 40 MG tablet  Take 40 mg by mouth at bedtime.    traZODone (DESYREL) 50 MG tablet Take 50 mg by mouth at bedtime.     triamcinolone (KENALOG) 0.5 % cream Apply 1 application topically daily as needed (for rash and redness.).     MICARDIS 80 MG tablet TAKE ONE TABLET BY MOUTH EVERY DAY Qty: 30 each, Refills: 5      STOP taking these medications     aspirin 81 MG EC tablet      FLUoxetine (PROZAC) 10 MG tablet          DISCHARGE INSTRUCTIONS:     She is to follow-up with Dr. Hortencia Pilar in the next few days after discharge, outpatient [facility] neurologist  in the next 2  weeks after discharge,  as well as vascular surgeon, Dr. Lucky Cowboy  in the next 1 week after discharge.  Patient is to continue renal carb modified diet with fluid restriction at 1500 cc fluid restriction with thin liquids  If you experience worsening of your admission symptoms, develop shortness of breath, life threatening emergency, suicidal or homicidal thoughts you must seek medical attention immediately by calling 911 or calling your MD immediately  if symptoms  less severe.  You Must read complete instructions/literature along with all the possible adverse reactions/side effects for all the Medicines you take and that have been prescribed to you. Take any new Medicines after you have completely understood and accept all the possible adverse reactions/side effects.   Please note  You were cared for by a hospitalist during your hospital stay. If you have any questions about your discharge medications or the care you received while you were in the hospital after you are discharged, you can call the unit and asked to speak with the hospitalist on call if the hospitalist that took care of you is not available. Once you are discharged, your primary care physician will handle any further medical issues. Please note that NO REFILLS for any discharge medications will be authorized once you are discharged, as it is imperative that you return to your primary care physician (or establish a relationship with a primary care physician if you do not have one) for your aftercare needs so that they can reassess your need for medications and monitor your lab values.    Today   CHIEF COMPLAINT:   Chief Complaint  Patient presents with  . Aphasia    difficulty speaking x 2 days, dialysis yesterday    HISTORY OF PRESENT ILLNESS:  Dawn Bradford  is a 79 y.o. female with a known history of hypertension, hyperlipidemia, coronary artery disease, renal artery stenosis, diastolic CHF, obesity, severe sleep apnea, obesity hypoventilation syndrome, hypothyroidism, presented to the hospital with complaints of difficulty speaking foot past 2 days.  Lattie Haw refer to Dr. Penelope Galas  admission note on 01/31/2015. Patient's initial head CT showed no acute abnormalities. Patient did have carotid ultrasound which revealed right ICA plaque resulting in less than 50% diameter stenosis and bilateral carotid bifurcation stenosis, also less than 50%. Patient was seen by Dr. Irish Elders,  neurologist, who recommended to advance her aspirin to 375 g daily and start her on dual therapy with aspirin and Plavix for 90 days if she was seen to have significant MCA stenosis on one head and neck CTA. Approximately 1.5 mm calcific embolus in the proximal internal and 2, division of left MCA was found which was felt to be originating from severe calcific plaque in left common carotid artery or left internal  carotid artery. No flow limiting stenosis of the extracranial vasculature was noted, although measurable luminal narrowing in the proximal left ICA approached 50%. Patient was seen by speech therapist while she is she was in the hospital and dysphagia 3 diet with nectar thick liquids were recommended with medications and given in applesauce. Patient did well with this consistency. She also worked with physical therapist who recommended skilled nursing facility placement for rehabilitation purposes for which patient's family was agreeable. It was felt the patient is stable to be discharged to skilled nursing facility today on 02/03/2015. Vision is to continue dual therapy with aspirin and Plavix for the next 90 days and follow-up with neurologist as well as primary care physician as outpatient. She was scheduled also to be seen by vascular surgeon while she was in the hospital. However, if vascular surgery consultation is not available today, patient would benefit from vascular evaluation as outpatient. Patient's lipid panel was checked while she was in the hospital and cholesterol level was found to be 122, triglycerides were 68, atrial cholesterol was 43 and LDL was 65, patient was recommended to continue current statin dose.   VITAL SIGNS:  Blood pressure 157/57, pulse 76, temperature 98.2 F (36.8 C), temperature source Oral, resp. rate 18, height 5\' 2"  (1.575 m), weight 106.913 kg (235 lb 11.2 oz), last menstrual period 02/01/1986, SpO2 94 %.  I/O:   Intake/Output Summary (Last 24 hours) at  02/03/15 1054 Last data filed at 02/03/15 0813  Gross per 24 hour  Intake    120 ml  Output   2000 ml  Net  -1880 ml    PHYSICAL EXAMINATION:  GENERAL:  79 y.o.-year-old patient lying in the bed with no acute distress. Has still significant difficulty expressing herself. Moving her upper as well as lower extremities and no significant facial weakness noted.  EYES: Pupils equal, round, reactive to light and accommodation. No scleral icterus. Extraocular muscles intact.  HEENT: Head atraumatic, normocephalic. Oropharynx and nasopharynx clear.  NECK:  Supple, no jugular venous distention. No thyroid enlargement, no tenderness.  LUNGS: Normal breath sounds bilaterally, no wheezing, rales,rhonchi or crepitation. No use of accessory muscles of respiration.  CARDIOVASCULAR: S1, S2 normal. No murmurs, rubs, or gallops.  ABDOMEN: Soft, non-tender, non-distended. Bowel sounds present. No organomegaly or mass.  EXTREMITIES: No pedal edema, cyanosis, or clubbing.  NEUROLOGIC: Cranial nerves II through XII are intact. Muscle strength 5/5 in all extremities. Sensation intact. Gait not checked.  PSYCHIATRIC: The patient is alert and oriented x 3.  SKIN: No obvious rash, lesion, or ulcer.   DATA REVIEW:   CBC  Recent Labs Lab 01/31/15 1857  WBC 7.8  HGB 11.2*  HCT 33.5*  PLT 142*    Chemistries   Recent Labs Lab 01/31/15 1857 02/02/15 1039  NA 135 136  K 4.3 4.6  CL 102 103  CO2 26 27  GLUCOSE 121* 145*  BUN 27* 30*  CREATININE 2.56* 2.58*  CALCIUM 8.8* 8.8*  AST 17  --   ALT 11*  --   ALKPHOS 74  --   BILITOT 0.4  --     Cardiac Enzymes  Recent Labs Lab 01/31/15 1857  TROPONINI <0.03    Microbiology Results  No results found for this or any previous visit.  RADIOLOGY:  Ct Angio Head W/cm &/or Wo Cm  02/02/2015   CLINICAL DATA:  Multiple acute LEFT MCA territory infarcts. Assess for proximal vascular lesion in this diabetic patient with end-stage renal disease,  diabetes, hypertension, and chronic diastolic heart failure.  EXAM: CT ANGIOGRAPHY HEAD AND NECK  TECHNIQUE: Multidetector CT imaging of the head and neck was performed using the standard protocol during bolus administration of intravenous contrast. Multiplanar CT image reconstructions and MIPs were obtained to evaluate the vascular anatomy. Carotid stenosis measurements (when applicable) are obtained utilizing NASCET criteria, using the distal internal carotid diameter as the denominator.  CONTRAST:  153mL OMNIPAQUE IOHEXOL 350 MG/ML SOLN  COMPARISON:  MR brain 02/01/2015 CT head 01/31/2015.  FINDINGS: CT HEAD  Calvarium and skull base: No fracture or destructive lesion. Mastoids and middle ears are grossly clear.  Paranasal sinuses: Imaged portions are free of acute disease. Chronic LEFT maxillary sinus mucosal thickening is evident.  Orbits: BILATERAL cataract extraction.  No masses.  Brain: No evidence of acute abnormality, including acute hemorrhage, hydrocephalus, or mass lesion. Generalized atrophy. Chronic microvascular ischemic change. Multifocal areas of acute LEFT hemisphere infarction more readily appreciated on MR than CT, with slight developing hypodensity in the LEFT insula, and LEFT temporal cortex. No interval hemorrhage.  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 Calcific plaque great vessel origins throughout. Imaged portion shows no evidence of aneurysm or dissection. No significant stenosis of the major arch vessel origins.  Right carotid system: No evidence of dissection, stenosis (50% or greater) or occlusion.  Left carotid system: Heavily calcified plaque over a 3 cm segment of the LEFT common carotid artery. Heavily calcified plaque over a 2 cm segment of LEFT internal carotid artery, with estimated 50% stenosis (2.2/4.5 proximal/distal ratio). No soft plaque is evident No evidence of dissection, or occlusion.  Vertebral  arteries: LEFT vertebral dominant. Calcific plaque the C1 level not clearly flow reducing. No evidence of dissection, stenosis (50% or greater) or occlusion.  Nonvascular tissues: Prior CABG. Moderate vascular congestion. Ground-glass opacities at the RIGHT greater than LEFT lung apices. No pneumothorax. Vascular dialysis catheter. Chronic biapical pleural thickening. Spondylosis. No neck masses. Unremarkable thyroid.  CTA HEAD  Anterior circulation: Heavily calcified cavernous and supraclinoid RICA and LICA without flow reducing lesion. Within the proximal inferior M2 division of the LEFT middle cerebral artery, there is a ~1.5 mm intraluminal calcific embolus (image 11 series 13, image 235 series 8). This embolus does not occlude the vessel, but its caliber may be slightly diminished compared to the superior M2 division. There is no obvious M3 or M4 branch occlusion. Its central location suggests embolic origin from the LEFT common carotid artery or LEFT internal carotid artery. It is unclear if neurointerventional retrieval is possible or warranted. No similar lesions of the RIGHT MCA or either ACA.  Posterior circulation: No significant stenosis, proximal occlusion, aneurysm, or vascular malformation.  Venous sinuses: As permitted by contrast timing, patent.  Anatomic variants: None of significance.  Delayed phase:   No abnormal intracranial enhancement.  IMPRESSION: ~1.5 mm calcific embolus to the proximal inferior M2 division, LEFT MCA. This likely originated from severe calcific plaque in the LEFT common carotid artery or LEFT internal carotid artery. See discussion above.  No flow-limiting stenosis of the extracranial vasculature, although measurable luminal narrowing in the proximal LEFT ICA approaches 50%.  Foci of evolving cytotoxic edema in the LEFT hemisphere are difficult to appreciate. There is no interval hemorrhagic transformation.   Electronically Signed   By: Rolla Flatten M.D.   On: 02/02/2015  09:46   Ct Angio Neck W/cm &/or Wo/cm  02/02/2015   CLINICAL DATA:  Multiple acute LEFT MCA territory infarcts. Assess for proximal vascular lesion in this diabetic patient with end-stage renal disease, diabetes, hypertension, and chronic diastolic heart failure.  EXAM: CT ANGIOGRAPHY HEAD AND NECK  TECHNIQUE: Multidetector CT imaging of the head and neck was performed using the standard protocol during bolus administration of intravenous contrast. Multiplanar CT image reconstructions and MIPs were obtained to evaluate the vascular anatomy. Carotid stenosis measurements (when applicable) are obtained utilizing NASCET criteria, using the distal internal carotid diameter as the denominator.  CONTRAST:  122mL OMNIPAQUE IOHEXOL 350 MG/ML SOLN  COMPARISON:  MR brain 02/01/2015 CT head 01/31/2015.  FINDINGS: CT HEAD  Calvarium and skull base: No fracture or destructive lesion. Mastoids and middle ears are grossly clear.  Paranasal sinuses: Imaged portions are free of acute disease. Chronic LEFT maxillary sinus mucosal thickening is evident.  Orbits: BILATERAL cataract extraction.  No masses.  Brain: No evidence of acute abnormality, including acute hemorrhage, hydrocephalus, or mass lesion. Generalized atrophy. Chronic microvascular ischemic change. Multifocal areas of acute LEFT hemisphere infarction more readily appreciated on MR than CT, with slight developing hypodensity in the LEFT insula, and LEFT temporal cortex. No interval hemorrhage.  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 Calcific plaque great vessel origins throughout. Imaged portion shows no evidence of aneurysm or dissection. No significant stenosis of the major arch vessel origins.  Right carotid system: No evidence of dissection, stenosis (50% or greater) or occlusion.  Left carotid system: Heavily calcified plaque over a 3 cm segment of the LEFT common carotid artery. Heavily calcified  plaque over a 2 cm segment of LEFT internal carotid artery, with estimated 50% stenosis (2.2/4.5 proximal/distal ratio). No soft plaque is evident No evidence of dissection, or occlusion.  Vertebral arteries: LEFT vertebral dominant. Calcific plaque the C1 level not clearly flow reducing. No evidence of dissection, stenosis (50% or greater) or occlusion.  Nonvascular tissues: Prior CABG. Moderate vascular congestion. Ground-glass opacities at the RIGHT greater than LEFT lung apices. No pneumothorax. Vascular dialysis catheter. Chronic biapical pleural thickening. Spondylosis. No neck masses. Unremarkable thyroid.  CTA HEAD  Anterior circulation: Heavily calcified cavernous and supraclinoid RICA and LICA without flow reducing lesion. Within the proximal inferior M2 division of the LEFT middle cerebral artery, there is a ~1.5 mm intraluminal calcific embolus (image 11 series 13, image 235 series 8). This embolus does not occlude the vessel, but its caliber may be slightly diminished compared to the superior M2 division. There is no obvious M3 or M4 branch occlusion. Its central location suggests embolic origin from the LEFT common carotid artery or LEFT internal carotid artery. It is unclear if neurointerventional retrieval is possible or warranted. No similar lesions of the RIGHT MCA or either ACA.  Posterior circulation: No significant stenosis, proximal occlusion, aneurysm, or vascular malformation.  Venous sinuses: As permitted by contrast timing, patent.  Anatomic variants: None of significance.  Delayed phase:   No abnormal intracranial enhancement.  IMPRESSION: ~1.5 mm calcific embolus to the proximal inferior M2 division, LEFT MCA. This likely originated from severe calcific plaque in the LEFT common carotid artery or LEFT internal carotid artery. See discussion above.  No flow-limiting stenosis of the extracranial vasculature, although measurable luminal narrowing in the proximal LEFT ICA approaches 50%.   Foci of evolving cytotoxic edema in the LEFT hemisphere are difficult to appreciate. There is no interval hemorrhagic transformation.   Electronically Signed   By: Michae Kava.D.  On: 02/02/2015 09:46   Mr Brain Wo Contrast  02/01/2015   CLINICAL DATA:  Patient developed aphasia 3-4 days ago. No reported RIGHT-sided weakness. Stroke risk factors include ESRD, hypertension, and diabetes.  EXAM: MRI HEAD WITHOUT CONTRAST  TECHNIQUE: Multiplanar, multiecho pulse sequences of the brain and surrounding structures were obtained without intravenous contrast.  COMPARISON:  CT head 01/31/2015 was unremarkable. Carotid Dopplers 02/01/2015 suggests BILATERAL non stenotic plaque.  FINDINGS: Multifocal areas of restricted diffusion throughout the LEFT hemisphere involving the temporal lobe, insula, frontal lobe, and parietal lobe, as well as the adjacent subcortical white matter, LEFT MCA territory distribution, non-confluent and nonhemorrhagic, suggesting a shower of emboli. No RIGHT-sided, brainstem, or cerebellar abnormalities.  No mass lesion, hydrocephalus, or extra-axial fluid.  Generalized atrophy. Moderately advanced T2 and FLAIR hyperintensity throughout the white matter suggesting small vessel disease. No midline abnormalities. Flow voids are maintained. No evidence for large vessel occlusion.  Extracranial soft tissues unremarkable.  Compared with prior CT, these acute infarcts are not visible.  IMPRESSION: Multifocal areas of restricted diffusion, non-confluent and nonhemorrhagic throughout the LEFT hemisphere, LEFT MCA territory distribution, consistent with a shower of emboli.  Atrophy and small vessel disease.  No evidence for large vessel occlusion.   Electronically Signed   By: Rolla Flatten M.D.   On: 02/01/2015 12:23   US Carotid Bilateral  02/01/2015   CLINICAL DATA:  Stroke.  Hypertension, diabetes.  EXAM: BILATERAL CAROTID DUPLEX ULTRASOUND  TECHNIQUE: Pearline Cables scale imaging, color Doppler and duplex  ultrasound was performed of bilateral carotid and vertebral arteries in the neck.  COMPARISON:  None.  REVIEW OF SYSTEMS: Quantification of carotid stenosis is based on velocity parameters that correlate the residual internal carotid diameter with NASCET-based stenosis levels, using the diameter of the distal internal carotid lumen as the denominator for stenosis measurement.  The following velocity measurements were obtained:  PEAK SYSTOLIC/END DIASTOLIC  RIGHT  ICA:                     93/14cm/sec  CCA:                     [id number]  SYSTOLIC ICA/CCA RATIO:  1.0  DIASTOLIC ICA/CCA RATIO: 1.0  ECA:                     127cm/sec  LEFT  ICA:                     93/15cm/sec  CCA:                     0000000  SYSTOLIC ICA/CCA RATIO:  1.0  DIASTOLIC ICA/CCA RATIO: 1.3  ECA:                     75cm/sec  FINDINGS: RIGHT CAROTID ARTERY: Eccentric plaque in the distal common carotid artery, bulb, and proximal ICA, without high-grade stenosis. Normal waveforms and color Doppler signal. ICA is tortuous.  RIGHT VERTEBRAL ARTERY:  Normal flow direction and waveform.  LEFT CAROTID ARTERY: Calcified plaque in the mid common carotid artery resulting in at least mild stenosis. Eccentric calcified plaque in the distal common carotid, bulb, and proximal ICA with mild stenosis. Normal waveforms and color Doppler signal. ICA mildly tortuous.  LEFT VERTEBRAL ARTERY: Normal flow direction and waveform.  IMPRESSION: 1. Bilateral carotid bifurcation and proximal ICA plaque resulting in less than 50% diameter stenosis. The exam does not  exclude plaque ulceration or embolization. Continued surveillance recommended.   Electronically Signed   By: Lucrezia Europe M.D.   On: 02/01/2015 11:39    EKG:   Orders placed or performed during the hospital encounter of 01/31/15  . ED EKG  . ED EKG      Management plans discussed with the patient, family and they are in agreement.  CODE STATUS:     Code Status Orders        Start      Ordered   01/31/15 2221  Full code   Continuous     01/31/15 2220      TOTAL TIME TAKING CARE OF THIS PATIENT: 40 minutes.    Theodoro Grist M.D on 02/03/2015 at 10:54 AM  Between 7am to 6pm - Pager - [phone]  After 6pm go to [email or web address] - password [email or web address] [facility]  Office  [phone]  CC: Primary care physician; Hortencia Pilar, MD

## 2015-02-03 NOTE — Consult Note (Signed)
CC: aphasia.    HPI: Dawn Bradford is an 79 y.o. female 79 y.o. female with a known history of coronary artery disease status post bypass grafting, end-stage renal disease on hemodialysis, diabetes mellitus type 2 and obesity presents with 3 days of word finding difficulty. Pt was unable to form complete sentences after dialysis, got very frustrated and went to bed. On day of admission patientcalled daughter crying stating that she needed help but was unable to say what she needed. CTH no acute abnormalities.    Pt's speech is at baseline. CTA L MCA stenosis.   Past Medical History  Diagnosis Date  . Hyperlipidemia   . Hypertension     resistant hyptertension times many years. the patient does have renal artery stenosis. she has tried calcium channel blockers in the past and states that she would not take them now because she had some problems with her gums which her dentist identified as calcium-channel blocker side effects.  . Diabetes mellitus     type 2  . Coronary artery disease     s/p anterior MI in 1996 followed by CABG. Lexiscan myoview (4/11): EF 67%, normal perfusion with no evidence for ischemia or infarction.   . Renal artery stenosis     The patient has an occluded right renal artery and an atrophic right kidney. There is 20% left renal artery stenosis. This was seen by catheterization in 2007  . Diastolic heart failure     most recent echo (12/10) showed EF 55-60% with mild LVH, grade I diastolic dysfunction, mild LAE.  [name] Aortic valve disorder     Echo in 2009 showed mean aortic valve gradient of 13 mmHg, suggesting very mild stenosis. Echo (12/10) suggested aortic sclerosis only  . Obesity   . Chronic kidney disease     most recent creatinine was 1.2  . Obstructive sleep apnea     PSG 01/05/2006 AHI 24.8, CPAP 9cm H2O  . Mild asthma     PFT 02/05/10 FEV1 1.38, FEV1% 73, TLC 3.78 (86%), DLCO 48%, +BD  . ACE-inhibitor cough   . Hypothyroidism   . Obesity hypoventilation  syndrome        . Pulmonary nodule, right   . Carotid artery disease     mild, carotid dopplers 12/2009  . Myocardial infarction   . CHF (congestive heart failure)   . Renal insufficiency     Past Surgical History  Procedure Laterality Date  . Tubal ligation  1973  . Inner ear surgery  1979    right  . Abdominal hysterectomy  1985  . Coronary artery bypass graft  1996  . Cataract extraction  2010  . Av fistula placement    . Dialysis fistula creation    . Peripheral vascular catheterization Left 01/06/2015    Procedure: A/V Shuntogram/Fistulagram;  Surgeon: Katha Cabal, MD;  Location: [facility];  Service: Cardiovascular;  Laterality: Left;    Family History  Problem Relation Age of Onset  . Kidney failure Mother   . Diabetes Mother   . Aortic aneurysm Father   . Coronary artery disease Father   . Heart attack Father   . Diabetes Sister   . Diabetes Brother   . Kidney failure Brother     Social History:  reports that she has never smoked. She has never used smokeless tobacco. She reports that she does not drink alcohol or use illicit drugs.  Allergies  Allergen Reactions  . Ace Inhibitors  unknown  . Amlodipine Besylate     "something happened to gums"  . Clonidine Hydrochloride Other (See Comments)    Unknown reaction.  . Codeine Hives  . Metformin     REACTION: vomiting    Medications: I have reviewed the patient's current medications.  ROS: History obtained from the patient  General ROS: negative for - chills, fatigue, fever, night sweats, weight gain or weight loss Psychological ROS: negative for - behavioral disorder, hallucinations, memory difficulties, mood swings or suicidal ideation Ophthalmic ROS: negative for - blurry vision, double vision, eye pain or loss of vision ENT ROS: negative for - epistaxis, nasal discharge, oral lesions, sore throat, tinnitus or vertigo Allergy and Immunology ROS: negative for - hives or itchy/watery  eyes Hematological and Lymphatic ROS: negative for - bleeding problems, bruising or swollen lymph nodes Endocrine ROS: negative for - galactorrhea, hair pattern changes, polydipsia/polyuria or temperature intolerance Respiratory ROS: negative for - cough, hemoptysis, shortness of breath or wheezing Cardiovascular ROS: negative for - chest pain, dyspnea on exertion, edema or irregular heartbeat Gastrointestinal ROS: negative for - abdominal pain, diarrhea, hematemesis, nausea/vomiting or stool incontinence Genito-Urinary ROS: negative for - dysuria, hematuria, incontinence or urinary frequency/urgency Musculoskeletal ROS: negative for - joint swelling or muscular weakness Neurological ROS: as noted in HPI Dermatological ROS: negative for rash and skin lesion changes  Physical Examination: Blood pressure 156/53, pulse 62, temperature 98.1 F (36.7 C), temperature source Oral, resp. rate 18, height 5\' 2"  (1.575 m), weight 106.913 kg (235 lb 11.2 oz), last menstrual period 02/01/1986, SpO2 97 %.  HEENT-  Normocephalic, no lesions, without obvious abnormality.  Normal external eye and conjunctiva.  Normal TM's bilaterally.  Normal auditory canals and external ears. Normal external nose, mucus membranes and septum.  Normal pharynx. Cardiovascular- regular rate and rhythm, S1, S2 normal, no murmur, click, rub or gallop, pulses palpable throughout   Lungs- chest clear, no wheezing, rales, normal symmetric air entry, Heart exam - S1, S2 normal, no murmur, no gallop, rate regular Abdomen- soft, non-tender; bowel sounds normal; no masses,  no organomegaly Extremities- less then 2 second capillary refill Lymph-no adenopathy palpable Musculoskeletal-no joint tenderness, deformity or swelling Skin-warm and dry, no hyperpigmentation, vitiligo, or suspicious lesions  Neurological Examination Mental Status: Alert, oriented, thought content appropriate.  Speech aphasic.  Able to follow 3 step commands  without difficulty. Cranial Nerves: II: Discs flat bilaterally; Visual fields grossly normal, pupils equal, round, reactive to light and accommodation III,IV, VI: ptosis not present, extra-ocular motions intact bilaterally V,VII: smile symmetric, facial light touch sensation normal bilaterally VIII: hearing normal bilaterally IX,X: gag reflex present XI: bilateral shoulder shrug XII: midline tongue extension Motor: Right : Upper extremity   5/5    Left:     Upper extremity   5/5  Lower extremity   5/5     Lower extremity   5/5 Tone and bulk:normal tone throughout; no atrophy noted Sensory: Pinprick and light touch intact throughout, bilaterally Deep Tendon Reflexes: 2+ and symmetric throughout Plantars: Right: downgoing   Left: downgoing Cerebellar: normal finger-to-nose, normal rapid alternating movements and normal heel-to-shin test Gait: not examined.        Laboratory Studies:   Basic Metabolic Panel:  Recent Labs Lab 01/31/15 1857 02/02/15 1039  NA 135 136  K 4.3 4.6  CL 102 103  CO2 26 27  GLUCOSE 121* 145*  BUN 27* 30*  CREATININE 2.56* 2.58*  CALCIUM 8.8* 8.8*  PHOS  --  3.0  Liver Function Tests:  Recent Labs Lab 01/31/15 1857 02/02/15 1039  AST 17  --   ALT 11*  --   ALKPHOS 74  --   BILITOT 0.4  --   PROT 6.2*  --   ALBUMIN 3.4* 3.2*   No results for input(s): LIPASE, AMYLASE in the last 168 hours. No results for input(s): AMMONIA in the last 168 hours.  CBC:  Recent Labs Lab 01/31/15 1857  WBC 7.8  NEUTROABS 4.8  HGB 11.2*  HCT 33.5*  MCV 98.2  PLT 142*    Cardiac Enzymes:  Recent Labs Lab 01/31/15 1857  TROPONINI <0.03    BNP: Invalid input(s): POCBNP  CBG:  Recent Labs Lab 02/02/15 0725 02/02/15 1650 02/02/15 2103 02/03/15 0723 02/03/15 1136  GLUCAP 147* 164* 85 112* 122*    Microbiology: No results found for this or any previous visit.  Coagulation Studies: No results for input(s): LABPROT, INR in the  last 72 hours.  Urinalysis:   Recent Labs Lab 01/31/15 1920  COLORURINE YELLOW*  LABSPEC 1.016  PHURINE 5.0  GLUCOSEU NEGATIVE  HGBUR NEGATIVE  BILIRUBINUR NEGATIVE  KETONESUR NEGATIVE  PROTEINUR 30*  NITRITE NEGATIVE  LEUKOCYTESUR NEGATIVE    Lipid Panel:     Component Value Date/Time   CHOL 122 02/01/2015 0433   CHOL 123 10/21/2011 0844   TRIG 68 02/01/2015 0433   TRIG 95 12/08/2009   HDL 43 02/01/2015 0433   HDL 43 10/21/2011 0844   CHOLHDL 2.8 02/01/2015 0433   CHOLHDL 2.9 10/21/2011 0844   VLDL 14 02/01/2015 0433   LDLCALC 65 02/01/2015 0433   LDLCALC 61 10/21/2011 0844    HgbA1C:  Lab Results  Component Value Date   HGBA1C 6.1* 02/01/2015    Urine Drug Screen:  No results found for: LABOPIA, COCAINSCRNUR, LABBENZ, AMPHETMU, THCU, LABBARB  Alcohol Level: No results for input(s): ETH in the last 168 hours.  Other results: EKG: normal EKG, normal sinus rhythm, unchanged from previous tracings.  Imaging: Ct Angio Head W/cm &/or Wo Cm  02/02/2015   CLINICAL DATA:  Multiple acute LEFT MCA territory infarcts. Assess for proximal vascular lesion in this diabetic patient with end-stage renal disease, diabetes, hypertension, and chronic diastolic heart failure.  EXAM: CT ANGIOGRAPHY HEAD AND NECK  TECHNIQUE: Multidetector CT imaging of the head and neck was performed using the standard protocol during bolus administration of intravenous contrast. Multiplanar CT image reconstructions and MIPs were obtained to evaluate the vascular anatomy. Carotid stenosis measurements (when applicable) are obtained utilizing NASCET criteria, using the distal internal carotid diameter as the denominator.  CONTRAST:  137mL OMNIPAQUE IOHEXOL 350 MG/ML SOLN  COMPARISON:  MR brain 02/01/2015 CT head 01/31/2015.  FINDINGS: CT HEAD  Calvarium and skull base: No fracture or destructive lesion. Mastoids and middle ears are grossly clear.  Paranasal sinuses: Imaged portions are free of acute  disease. Chronic LEFT maxillary sinus mucosal thickening is evident.  Orbits: BILATERAL cataract extraction.  No masses.  Brain: No evidence of acute abnormality, including acute hemorrhage, hydrocephalus, or mass lesion. Generalized atrophy. Chronic microvascular ischemic change. Multifocal areas of acute LEFT hemisphere infarction more readily appreciated on MR than CT, with slight developing hypodensity in the LEFT insula, and LEFT temporal cortex. No interval hemorrhage.  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 Calcific plaque great vessel origins throughout. Imaged portion shows no evidence of aneurysm or dissection. No significant stenosis of the major arch vessel  origins.  Right carotid system: No evidence of dissection, stenosis (50% or greater) or occlusion.  Left carotid system: Heavily calcified plaque over a 3 cm segment of the LEFT common carotid artery. Heavily calcified plaque over a 2 cm segment of LEFT internal carotid artery, with estimated 50% stenosis (2.2/4.5 proximal/distal ratio). No soft plaque is evident No evidence of dissection, or occlusion.  Vertebral arteries: LEFT vertebral dominant. Calcific plaque the C1 level not clearly flow reducing. No evidence of dissection, stenosis (50% or greater) or occlusion.  Nonvascular tissues: Prior CABG. Moderate vascular congestion. Ground-glass opacities at the RIGHT greater than LEFT lung apices. No pneumothorax. Vascular dialysis catheter. Chronic biapical pleural thickening. Spondylosis. No neck masses. Unremarkable thyroid.  CTA HEAD  Anterior circulation: Heavily calcified cavernous and supraclinoid RICA and LICA without flow reducing lesion. Within the proximal inferior M2 division of the LEFT middle cerebral artery, there is a ~1.5 mm intraluminal calcific embolus (image 11 series 13, image 235 series 8). This embolus does not occlude the vessel, but its caliber may be slightly  diminished compared to the superior M2 division. There is no obvious M3 or M4 branch occlusion. Its central location suggests embolic origin from the LEFT common carotid artery or LEFT internal carotid artery. It is unclear if neurointerventional retrieval is possible or warranted. No similar lesions of the RIGHT MCA or either ACA.  Posterior circulation: No significant stenosis, proximal occlusion, aneurysm, or vascular malformation.  Venous sinuses: As permitted by contrast timing, patent.  Anatomic variants: None of significance.  Delayed phase:   No abnormal intracranial enhancement.  IMPRESSION: ~1.5 mm calcific embolus to the proximal inferior M2 division, LEFT MCA. This likely originated from severe calcific plaque in the LEFT common carotid artery or LEFT internal carotid artery. See discussion above.  No flow-limiting stenosis of the extracranial vasculature, although measurable luminal narrowing in the proximal LEFT ICA approaches 50%.  Foci of evolving cytotoxic edema in the LEFT hemisphere are difficult to appreciate. There is no interval hemorrhagic transformation.   Electronically Signed   By: Rolla Flatten M.D.   On: 02/02/2015 09:46   Ct Angio Neck W/cm &/or Wo/cm  02/02/2015   CLINICAL DATA:  Multiple acute LEFT MCA territory infarcts. Assess for proximal vascular lesion in this diabetic patient with end-stage renal disease, diabetes, hypertension, and chronic diastolic heart failure.  EXAM: CT ANGIOGRAPHY HEAD AND NECK  TECHNIQUE: Multidetector CT imaging of the head and neck was performed using the standard protocol during bolus administration of intravenous contrast. Multiplanar CT image reconstructions and MIPs were obtained to evaluate the vascular anatomy. Carotid stenosis measurements (when applicable) are obtained utilizing NASCET criteria, using the distal internal carotid diameter as the denominator.  CONTRAST:  149mL OMNIPAQUE IOHEXOL 350 MG/ML SOLN  COMPARISON:  MR brain 02/01/2015 CT  head 01/31/2015.  FINDINGS: CT HEAD  Calvarium and skull base: No fracture or destructive lesion. Mastoids and middle ears are grossly clear.  Paranasal sinuses: Imaged portions are free of acute disease. Chronic LEFT maxillary sinus mucosal thickening is evident.  Orbits: BILATERAL cataract extraction.  No masses.  Brain: No evidence of acute abnormality, including acute hemorrhage, hydrocephalus, or mass lesion. Generalized atrophy. Chronic microvascular ischemic change. Multifocal areas of acute LEFT hemisphere infarction more readily appreciated on MR than CT, with slight developing hypodensity in the LEFT insula, and LEFT temporal cortex. No interval hemorrhage.  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 Calcific plaque great vessel  origins throughout. Imaged portion shows no evidence of aneurysm or dissection. No significant stenosis of the major arch vessel origins.  Right carotid system: No evidence of dissection, stenosis (50% or greater) or occlusion.  Left carotid system: Heavily calcified plaque over a 3 cm segment of the LEFT common carotid artery. Heavily calcified plaque over a 2 cm segment of LEFT internal carotid artery, with estimated 50% stenosis (2.2/4.5 proximal/distal ratio). No soft plaque is evident No evidence of dissection, or occlusion.  Vertebral arteries: LEFT vertebral dominant. Calcific plaque the C1 level not clearly flow reducing. No evidence of dissection, stenosis (50% or greater) or occlusion.  Nonvascular tissues: Prior CABG. Moderate vascular congestion. Ground-glass opacities at the RIGHT greater than LEFT lung apices. No pneumothorax. Vascular dialysis catheter. Chronic biapical pleural thickening. Spondylosis. No neck masses. Unremarkable thyroid.  CTA HEAD  Anterior circulation: Heavily calcified cavernous and supraclinoid RICA and LICA without flow reducing lesion. Within the proximal inferior M2 division of the  LEFT middle cerebral artery, there is a ~1.5 mm intraluminal calcific embolus (image 11 series 13, image 235 series 8). This embolus does not occlude the vessel, but its caliber may be slightly diminished compared to the superior M2 division. There is no obvious M3 or M4 branch occlusion. Its central location suggests embolic origin from the LEFT common carotid artery or LEFT internal carotid artery. It is unclear if neurointerventional retrieval is possible or warranted. No similar lesions of the RIGHT MCA or either ACA.  Posterior circulation: No significant stenosis, proximal occlusion, aneurysm, or vascular malformation.  Venous sinuses: As permitted by contrast timing, patent.  Anatomic variants: None of significance.  Delayed phase:   No abnormal intracranial enhancement.  IMPRESSION: ~1.5 mm calcific embolus to the proximal inferior M2 division, LEFT MCA. This likely originated from severe calcific plaque in the LEFT common carotid artery or LEFT internal carotid artery. See discussion above.  No flow-limiting stenosis of the extracranial vasculature, although measurable luminal narrowing in the proximal LEFT ICA approaches 50%.  Foci of evolving cytotoxic edema in the LEFT hemisphere are difficult to appreciate. There is no interval hemorrhagic transformation.   Electronically Signed   By: Rolla Flatten M.D.   On: 02/02/2015 09:46     Assessment/Plan: 79 y/o F. With periods of aphasia worsening in the past 3 days. CTH no acute abnormality. Pt was on ASA 81 mg prior to hospital stay.   MRI brain embolic L frontal/L MCA strokes.  Pt's speech is at baseline. CTA L MCA stenosis  - ASA 81mg  and Plavix 75 daily for 90 days followed by only Plavix after the 90 day mark - d/c planning from neuro stand point.    Leotis Pain   02/03/2015, 2:44 PM

## 2015-02-03 NOTE — Progress Notes (Signed)
Patient is discharge in a stable condition to [facility] , report givent to floor nurse Chrissie , left by EMS

## 2015-02-03 NOTE — Progress Notes (Signed)
[facility]  ROUNDING NOTE   Subjective:  Pt had HD yesterday. Tolerated well.  Possible d/c to rehab today.  Objective:  Vital signs in last 24 hours:  Temp:  [98.2 F (36.8 C)-98.5 F (36.9 C)] 98.2 F (36.8 C) (05/31 0840) Pulse Rate:  [67-76] 76 (05/31 0943) Resp:  [16-32] 18 (05/31 0840) BP: (130-173)/(42-70) 157/57 mmHg (05/31 0840) SpO2:  [94 %-99 %] 94 % (05/31 0943) Weight:  [106.913 kg (235 lb 11.2 oz)-107.5 kg (236 lb 15.9 oz)] 106.913 kg (235 lb 11.2 oz) (05/31 0527)  Weight change: 0 kg (0 lb) Filed Weights   02/02/15 0940 02/02/15 1337 02/03/15 0527  Weight: 109.09 kg (240 lb 8 oz) 107.5 kg (236 lb 15.9 oz) 106.913 kg (235 lb 11.2 oz)    Intake/Output: I/O last 3 completed shifts: In: -  Out: 2150 [Urine:150; Other:2000]   Intake/Output this shift:  Total I/O In: 120 [P.O.:120] Out: 0   Physical Exam: General: NAD  Head: Normocephalic, atraumatic. Moist oral mucosal membranes  Eyes: Anicteric, PERRL  Neck: Supple, trachea midline  Lungs:  Clear to auscultation normal effort  Heart: Regular rate and rhythm no rubs or murmurs  Abdomen:  Soft, nontender, BS present  Extremities:  trace peripheral edema.  Neurologic: Awake, alert, strength 5/5 in both upper and lower extremeties, aphasia seems improved  Skin: No lesions  Access: LUE AVF    Basic Metabolic Panel:  Recent Labs Lab 01/31/15 1857 02/02/15 1039  NA 135 136  K 4.3 4.6  CL 102 103  CO2 26 27  GLUCOSE 121* 145*  BUN 27* 30*  CREATININE 2.56* 2.58*  CALCIUM 8.8* 8.8*  PHOS  --  3.0    Liver Function Tests:  Recent Labs Lab 01/31/15 1857 02/02/15 1039  AST 17  --   ALT 11*  --   ALKPHOS 74  --   BILITOT 0.4  --   PROT 6.2*  --   ALBUMIN 3.4* 3.2*   No results for input(s): LIPASE, AMYLASE in the last 168 hours. No results for input(s): AMMONIA in the last 168 hours.  CBC:  Recent Labs Lab 01/31/15 1857  WBC 7.8  NEUTROABS 4.8  HGB 11.2*  HCT  33.5*  MCV 98.2  PLT 142*    Cardiac Enzymes:  Recent Labs Lab 01/31/15 1857  TROPONINI <0.03    BNP: Invalid input(s): POCBNP  CBG:  Recent Labs Lab 02/01/15 2003 02/02/15 0725 02/02/15 1650 02/02/15 2103 02/03/15 0723  GLUCAP 128* 147* 164* 85 112*    Microbiology: No results found for this or any previous visit.  Coagulation Studies: No results for input(s): LABPROT, INR in the last 72 hours.  Urinalysis:  Recent Labs  01/31/15 1920  COLORURINE YELLOW*  LABSPEC 1.016  PHURINE 5.0  GLUCOSEU NEGATIVE  HGBUR NEGATIVE  BILIRUBINUR NEGATIVE  KETONESUR NEGATIVE  PROTEINUR 30*  NITRITE NEGATIVE  LEUKOCYTESUR NEGATIVE      Imaging: Ct Angio Head W/cm &/or Wo Cm  02/02/2015   CLINICAL DATA:  Multiple acute LEFT MCA territory infarcts. Assess for proximal vascular lesion in this diabetic patient with end-stage renal disease, diabetes, hypertension, and chronic diastolic heart failure.  EXAM: CT ANGIOGRAPHY HEAD AND NECK  TECHNIQUE: Multidetector CT imaging of the head and neck was performed using the standard protocol during bolus administration of intravenous contrast. Multiplanar CT image reconstructions and MIPs were obtained to evaluate the vascular anatomy. Carotid stenosis measurements (when applicable) are obtained utilizing NASCET criteria, using the distal internal  carotid diameter as the denominator.  CONTRAST:  134mL OMNIPAQUE IOHEXOL 350 MG/ML SOLN  COMPARISON:  MR brain 02/01/2015 CT head 01/31/2015.  FINDINGS: CT HEAD  Calvarium and skull base: No fracture or destructive lesion. Mastoids and middle ears are grossly clear.  Paranasal sinuses: Imaged portions are free of acute disease. Chronic LEFT maxillary sinus mucosal thickening is evident.  Orbits: BILATERAL cataract extraction.  No masses.  Brain: No evidence of acute abnormality, including acute hemorrhage, hydrocephalus, or mass lesion. Generalized atrophy. Chronic microvascular ischemic change.  Multifocal areas of acute LEFT hemisphere infarction more readily appreciated on MR than CT, with slight developing hypodensity in the LEFT insula, and LEFT temporal cortex. No interval hemorrhage.  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 Calcific plaque great vessel origins throughout. Imaged portion shows no evidence of aneurysm or dissection. No significant stenosis of the major arch vessel origins.  Right carotid system: No evidence of dissection, stenosis (50% or greater) or occlusion.  Left carotid system: Heavily calcified plaque over a 3 cm segment of the LEFT common carotid artery. Heavily calcified plaque over a 2 cm segment of LEFT internal carotid artery, with estimated 50% stenosis (2.2/4.5 proximal/distal ratio). No soft plaque is evident No evidence of dissection, or occlusion.  Vertebral arteries: LEFT vertebral dominant. Calcific plaque the C1 level not clearly flow reducing. No evidence of dissection, stenosis (50% or greater) or occlusion.  Nonvascular tissues: Prior CABG. Moderate vascular congestion. Ground-glass opacities at the RIGHT greater than LEFT lung apices. No pneumothorax. Vascular dialysis catheter. Chronic biapical pleural thickening. Spondylosis. No neck masses. Unremarkable thyroid.  CTA HEAD  Anterior circulation: Heavily calcified cavernous and supraclinoid RICA and LICA without flow reducing lesion. Within the proximal inferior M2 division of the LEFT middle cerebral artery, there is a ~1.5 mm intraluminal calcific embolus (image 11 series 13, image 235 series 8). This embolus does not occlude the vessel, but its caliber may be slightly diminished compared to the superior M2 division. There is no obvious M3 or M4 branch occlusion. Its central location suggests embolic origin from the LEFT common carotid artery or LEFT internal carotid artery. It is unclear if neurointerventional retrieval is possible or warranted.  No similar lesions of the RIGHT MCA or either ACA.  Posterior circulation: No significant stenosis, proximal occlusion, aneurysm, or vascular malformation.  Venous sinuses: As permitted by contrast timing, patent.  Anatomic variants: None of significance.  Delayed phase:   No abnormal intracranial enhancement.  IMPRESSION: ~1.5 mm calcific embolus to the proximal inferior M2 division, LEFT MCA. This likely originated from severe calcific plaque in the LEFT common carotid artery or LEFT internal carotid artery. See discussion above.  No flow-limiting stenosis of the extracranial vasculature, although measurable luminal narrowing in the proximal LEFT ICA approaches 50%.  Foci of evolving cytotoxic edema in the LEFT hemisphere are difficult to appreciate. There is no interval hemorrhagic transformation.   Electronically Signed   By: Rolla Flatten M.D.   On: 02/02/2015 09:46   Ct Angio Neck W/cm &/or Wo/cm  02/02/2015   CLINICAL DATA:  Multiple acute LEFT MCA territory infarcts. Assess for proximal vascular lesion in this diabetic patient with end-stage renal disease, diabetes, hypertension, and chronic diastolic heart failure.  EXAM: CT ANGIOGRAPHY HEAD AND NECK  TECHNIQUE: Multidetector CT imaging of the head and neck was performed using the standard protocol during bolus administration of intravenous contrast. Multiplanar CT image reconstructions and MIPs were obtained  to evaluate the vascular anatomy. Carotid stenosis measurements (when applicable) are obtained utilizing NASCET criteria, using the distal internal carotid diameter as the denominator.  CONTRAST:  177mL OMNIPAQUE IOHEXOL 350 MG/ML SOLN  COMPARISON:  MR brain 02/01/2015 CT head 01/31/2015.  FINDINGS: CT HEAD  Calvarium and skull base: No fracture or destructive lesion. Mastoids and middle ears are grossly clear.  Paranasal sinuses: Imaged portions are free of acute disease. Chronic LEFT maxillary sinus mucosal thickening is evident.  Orbits: BILATERAL  cataract extraction.  No masses.  Brain: No evidence of acute abnormality, including acute hemorrhage, hydrocephalus, or mass lesion. Generalized atrophy. Chronic microvascular ischemic change. Multifocal areas of acute LEFT hemisphere infarction more readily appreciated on MR than CT, with slight developing hypodensity in the LEFT insula, and LEFT temporal cortex. No interval hemorrhage.  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 Calcific plaque great vessel origins throughout. Imaged portion shows no evidence of aneurysm or dissection. No significant stenosis of the major arch vessel origins.  Right carotid system: No evidence of dissection, stenosis (50% or greater) or occlusion.  Left carotid system: Heavily calcified plaque over a 3 cm segment of the LEFT common carotid artery. Heavily calcified plaque over a 2 cm segment of LEFT internal carotid artery, with estimated 50% stenosis (2.2/4.5 proximal/distal ratio). No soft plaque is evident No evidence of dissection, or occlusion.  Vertebral arteries: LEFT vertebral dominant. Calcific plaque the C1 level not clearly flow reducing. No evidence of dissection, stenosis (50% or greater) or occlusion.  Nonvascular tissues: Prior CABG. Moderate vascular congestion. Ground-glass opacities at the RIGHT greater than LEFT lung apices. No pneumothorax. Vascular dialysis catheter. Chronic biapical pleural thickening. Spondylosis. No neck masses. Unremarkable thyroid.  CTA HEAD  Anterior circulation: Heavily calcified cavernous and supraclinoid RICA and LICA without flow reducing lesion. Within the proximal inferior M2 division of the LEFT middle cerebral artery, there is a ~1.5 mm intraluminal calcific embolus (image 11 series 13, image 235 series 8). This embolus does not occlude the vessel, but its caliber may be slightly diminished compared to the superior M2 division. There is no obvious M3 or M4 branch  occlusion. Its central location suggests embolic origin from the LEFT common carotid artery or LEFT internal carotid artery. It is unclear if neurointerventional retrieval is possible or warranted. No similar lesions of the RIGHT MCA or either ACA.  Posterior circulation: No significant stenosis, proximal occlusion, aneurysm, or vascular malformation.  Venous sinuses: As permitted by contrast timing, patent.  Anatomic variants: None of significance.  Delayed phase:   No abnormal intracranial enhancement.  IMPRESSION: ~1.5 mm calcific embolus to the proximal inferior M2 division, LEFT MCA. This likely originated from severe calcific plaque in the LEFT common carotid artery or LEFT internal carotid artery. See discussion above.  No flow-limiting stenosis of the extracranial vasculature, although measurable luminal narrowing in the proximal LEFT ICA approaches 50%.  Foci of evolving cytotoxic edema in the LEFT hemisphere are difficult to appreciate. There is no interval hemorrhagic transformation.   Electronically Signed   By: Rolla Flatten M.D.   On: 02/02/2015 09:46   Mr Brain Wo Contrast  02/01/2015   CLINICAL DATA:  Patient developed aphasia 3-4 days ago. No reported RIGHT-sided weakness. Stroke risk factors include ESRD, hypertension, and diabetes.  EXAM: MRI HEAD WITHOUT CONTRAST  TECHNIQUE: Multiplanar, multiecho pulse sequences of the brain and surrounding structures were obtained without intravenous contrast.  COMPARISON:  CT head  01/31/2015 was unremarkable. Carotid Dopplers 02/01/2015 suggests BILATERAL non stenotic plaque.  FINDINGS: Multifocal areas of restricted diffusion throughout the LEFT hemisphere involving the temporal lobe, insula, frontal lobe, and parietal lobe, as well as the adjacent subcortical white matter, LEFT MCA territory distribution, non-confluent and nonhemorrhagic, suggesting a shower of emboli. No RIGHT-sided, brainstem, or cerebellar abnormalities.  No mass lesion, hydrocephalus,  or extra-axial fluid.  Generalized atrophy. Moderately advanced T2 and FLAIR hyperintensity throughout the white matter suggesting small vessel disease. No midline abnormalities. Flow voids are maintained. No evidence for large vessel occlusion.  Extracranial soft tissues unremarkable.  Compared with prior CT, these acute infarcts are not visible.  IMPRESSION: Multifocal areas of restricted diffusion, non-confluent and nonhemorrhagic throughout the LEFT hemisphere, LEFT MCA territory distribution, consistent with a shower of emboli.  Atrophy and small vessel disease.  No evidence for large vessel occlusion.   Electronically Signed   By: Rolla Flatten M.D.   On: 02/01/2015 12:23   US Carotid Bilateral  02/01/2015   CLINICAL DATA:  Stroke.  Hypertension, diabetes.  EXAM: BILATERAL CAROTID DUPLEX ULTRASOUND  TECHNIQUE: Pearline Cables scale imaging, color Doppler and duplex ultrasound was performed of bilateral carotid and vertebral arteries in the neck.  COMPARISON:  None.  REVIEW OF SYSTEMS: Quantification of carotid stenosis is based on velocity parameters that correlate the residual internal carotid diameter with NASCET-based stenosis levels, using the diameter of the distal internal carotid lumen as the denominator for stenosis measurement.  The following velocity measurements were obtained:  PEAK SYSTOLIC/END DIASTOLIC  RIGHT  ICA:                     93/14cm/sec  CCA:                     [id number]  SYSTOLIC ICA/CCA RATIO:  1.0  DIASTOLIC ICA/CCA RATIO: 1.0  ECA:                     127cm/sec  LEFT  ICA:                     93/15cm/sec  CCA:                     0000000  SYSTOLIC ICA/CCA RATIO:  1.0  DIASTOLIC ICA/CCA RATIO: 1.3  ECA:                     75cm/sec  FINDINGS: RIGHT CAROTID ARTERY: Eccentric plaque in the distal common carotid artery, bulb, and proximal ICA, without high-grade stenosis. Normal waveforms and color Doppler signal. ICA is tortuous.  RIGHT VERTEBRAL ARTERY:  Normal flow direction and  waveform.  LEFT CAROTID ARTERY: Calcified plaque in the mid common carotid artery resulting in at least mild stenosis. Eccentric calcified plaque in the distal common carotid, bulb, and proximal ICA with mild stenosis. Normal waveforms and color Doppler signal. ICA mildly tortuous.  LEFT VERTEBRAL ARTERY: Normal flow direction and waveform.  IMPRESSION: 1. Bilateral carotid bifurcation and proximal ICA plaque resulting in less than 50% diameter stenosis. The exam does not exclude plaque ulceration or embolization. Continued surveillance recommended.   Electronically Signed   By: Lucrezia Europe M.D.   On: 02/01/2015 11:39     Medications:     . aspirin  81 mg Oral Daily  . carvedilol  25 mg Oral BID WC  . clopidogrel  75 mg Oral Daily  . feeding supplement (  NEPRO CARB STEADY)  237 mL Oral Daily  . FLUoxetine  10 mg Oral Daily  . furosemide  40 mg Oral Daily  . heparin  5,000 Units Subcutaneous 3 times per day  . hydrALAZINE  25 mg Oral TID  . insulin aspart  0-5 Units Subcutaneous QHS  . insulin aspart  0-9 Units Subcutaneous TID WC  . insulin aspart  6 Units Subcutaneous TID WC  . insulin glargine  38 Units Subcutaneous QHS  . levothyroxine  75 mcg Oral QAC breakfast  . nystatin   Topical BID  . rosuvastatin  40 mg Oral QHS  . traZODone  50 mg Oral QHS   sodium chloride, sodium chloride, acetaminophen **OR** acetaminophen, heparin, labetalol, lactulose, lidocaine (PF), lidocaine-prilocaine, pentafluoroprop-tetrafluoroeth, senna-docusate, triamcinolone cream  Assessment/ Plan:  79 y.o. female with past medical history of ESRD on HD MWF, DM-2, Hypertension, hyperlipidemia, renal artery stenosis, peripheral vascular disease, obstructive sleep apena, hypothyroidism, carotid artery disease, history of myocardial infarction, anemia of CKD, SHPTH, hypothyroidism  1.  ESRD on HD: Pt had HD yesterday, tolerated well, next HD on Wed. 2.  Anemia of CKD:  Will hold epogen for now given embolic CVA. 3.   SHPTH: phos 3.0 and acceptable. 4.  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  CT angiogram reviewed, would consider vascular surgery consultation for this issue as well given plaque in L ICA.   LOS: 3 Magdalen Cabana 5/31/201611:23 AM

## 2015-02-03 NOTE — Clinical Social Work Note (Addendum)
Pt was notified of all bed offers for SNF placement.  CSW will f/u again with pt and family (in the room) on which facility they would like to go to.  Pt and family have been notified that pt is ready to DC today.

## 2015-02-03 NOTE — Progress Notes (Signed)
[facility] NAME: Dawn Bradford    MR#:  [id number]  DATE OF BIRTH:  [birth year]  SUBJECTIVE:  CHIEF COMPLAINT:   Chief Complaint  Patient presents with  . Aphasia    difficulty speaking x 2 days, dialysis yesterday   Still has word finding difficulty. Frustrated due to inability to speak. No other concerns. HD tomorrow, per nephrology. Feels comfortable. However, very weak overall. Not getting much out from bed commended to go to skilled nursing facility for rehabilitation. Care managers are working in regards to setting it up.  REVIEW OF SYSTEMS:    Review of Systems  Constitutional: Negative for fever, chills and weight loss.  HENT: Negative for congestion.   Eyes: Negative for blurred vision and double vision.  Respiratory: Negative for cough, sputum production, shortness of breath and wheezing.   Cardiovascular: Negative for chest pain, palpitations, orthopnea, leg swelling and PND.  Gastrointestinal: Negative for nausea, vomiting, abdominal pain, diarrhea, constipation and blood in stool.  Genitourinary: Negative for dysuria, urgency, frequency and hematuria.  Musculoskeletal: Negative for falls.  Neurological: Negative for dizziness, tremors, focal weakness and headaches.  Endo/Heme/Allergies: Does not bruise/bleed easily.  Psychiatric/Behavioral: Negative for depression. The patient does not have insomnia.    Review of Systems  Constitutional: Negative for fever and chills.  HENT: Negative for sore throat.  Eyes: Negative for blurred vision, double vision and pain.  Respiratory: Negative for cough, hemoptysis, shortness of breath and wheezing.  Cardiovascular: Negative for chest pain, palpitations, orthopnea and leg swelling.  Gastrointestinal: Negative for heartburn, nausea, vomiting, abdominal pain, diarrhea and constipation.  Genitourinary: Negative for dysuria and hematuria.  Musculoskeletal: Negative for back  pain and joint pain.  Skin: Negative for rash.  Neurological: Negative for sensory change,  focal weakness and headaches. Positive speech changes Endo/Heme/Allergies: Does not bruise/bleed easily.  Psychiatric/Behavioral: Negative for depression. The patient is not nervous/anxious.   DRUG ALLERGIES:   Allergies  Allergen Reactions  . Ace Inhibitors     unknown  . Amlodipine Besylate     "something happened to gums"  . Clonidine Hydrochloride Other (See Comments)    Unknown reaction.  . Codeine Hives  . Metformin     REACTION: vomiting    VITALS:  Blood pressure 157/57, pulse 76, temperature 98.2 F (36.8 C), temperature source Oral, resp. rate 18, height 5\' 2"  (1.575 m), weight 106.913 kg (235 lb 11.2 oz), last menstrual period 02/01/1986, SpO2 94 %.  PHYSICAL EXAMINATION:   Physical Exam  GENERAL:  79 y.o.-year-old patient lying in the bed with no acute distress. Significant expressive aphasia. Difficulty finding words. No problems understanding. EYES: Pupils equal, round, reactive to light and accommodation. No scleral icterus. Extraocular muscles intact.  HEENT: Head atraumatic, normocephalic. Oropharynx and nasopharynx clear.  NECK:  Supple, no jugular venous distention. No thyroid enlargement, no tenderness.  LUNGS: Normal breath sounds bilaterally, no wheezing, rales, rhonchi. No use of accessory muscles of respiration.  CARDIOVASCULAR: S1, S2 normal. No murmurs, rubs, or gallops.  ABDOMEN: Soft, nontender, nondistended. Bowel sounds present. No organomegaly or mass.  EXTREMITIES: No cyanosis, clubbing or edema b/l.  LUE fistula  NEUROLOGIC: Cranial nerves II through XII are intact. No focal Motor or sensory deficits b/l.  Word finding difficulty. PSYCHIATRIC: The patient is alert and oriented x 3.  SKIN: No obvious rash, lesion, or ulcer.    LABORATORY PANEL:   CBC  Recent Labs Lab 01/31/15 1857  WBC 7.8  HGB 11.2*  HCT 33.5*  PLT 142*    ------------------------------------------------------------------------------------------------------------------  Chemistries   Recent Labs Lab 01/31/15 1857 02/02/15 1039  NA 135 136  K 4.3 4.6  CL 102 103  CO2 26 27  GLUCOSE 121* 145*  BUN 27* 30*  CREATININE 2.56* 2.58*  CALCIUM 8.8* 8.8*  AST 17  --   ALT 11*  --   ALKPHOS 74  --   BILITOT 0.4  --    ------------------------------------------------------------------------------------------------------------------  Cardiac Enzymes  Recent Labs Lab 01/31/15 1857  TROPONINI <0.03   ------------------------------------------------------------------------------------------------------------------  RADIOLOGY:  Ct Angio Head W/cm &/or Wo Cm  02/02/2015   CLINICAL DATA:  Multiple acute LEFT MCA territory infarcts. Assess for proximal vascular lesion in this diabetic patient with end-stage renal disease, diabetes, hypertension, and chronic diastolic heart failure.  EXAM: CT ANGIOGRAPHY HEAD AND NECK  TECHNIQUE: Multidetector CT imaging of the head and neck was performed using the standard protocol during bolus administration of intravenous contrast. Multiplanar CT image reconstructions and MIPs were obtained to evaluate the vascular anatomy. Carotid stenosis measurements (when applicable) are obtained utilizing NASCET criteria, using the distal internal carotid diameter as the denominator.  CONTRAST:  165mL OMNIPAQUE IOHEXOL 350 MG/ML SOLN  COMPARISON:  MR brain 02/01/2015 CT head 01/31/2015.  FINDINGS: CT HEAD  Calvarium and skull base: No fracture or destructive lesion. Mastoids and middle ears are grossly clear.  Paranasal sinuses: Imaged portions are free of acute disease. Chronic LEFT maxillary sinus mucosal thickening is evident.  Orbits: BILATERAL cataract extraction.  No masses.  Brain: No evidence of acute abnormality, including acute hemorrhage, hydrocephalus, or mass lesion. Generalized atrophy. Chronic microvascular  ischemic change. Multifocal areas of acute LEFT hemisphere infarction more readily appreciated on MR than CT, with slight developing hypodensity in the LEFT insula, and LEFT temporal cortex. No interval hemorrhage.  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 Calcific plaque great vessel origins throughout. Imaged portion shows no evidence of aneurysm or dissection. No significant stenosis of the major arch vessel origins.  Right carotid system: No evidence of dissection, stenosis (50% or greater) or occlusion.  Left carotid system: Heavily calcified plaque over a 3 cm segment of the LEFT common carotid artery. Heavily calcified plaque over a 2 cm segment of LEFT internal carotid artery, with estimated 50% stenosis (2.2/4.5 proximal/distal ratio). No soft plaque is evident No evidence of dissection, or occlusion.  Vertebral arteries: LEFT vertebral dominant. Calcific plaque the C1 level not clearly flow reducing. No evidence of dissection, stenosis (50% or greater) or occlusion.  Nonvascular tissues: Prior CABG. Moderate vascular congestion. Ground-glass opacities at the RIGHT greater than LEFT lung apices. No pneumothorax. Vascular dialysis catheter. Chronic biapical pleural thickening. Spondylosis. No neck masses. Unremarkable thyroid.  CTA HEAD  Anterior circulation: Heavily calcified cavernous and supraclinoid RICA and LICA without flow reducing lesion. Within the proximal inferior M2 division of the LEFT middle cerebral artery, there is a ~1.5 mm intraluminal calcific embolus (image 11 series 13, image 235 series 8). This embolus does not occlude the vessel, but its caliber may be slightly diminished compared to the superior M2 division. There is no obvious M3 or M4 branch occlusion. Its central location suggests embolic origin from the LEFT common carotid artery or LEFT internal carotid artery. It is unclear if neurointerventional retrieval is  possible or warranted. No similar lesions of the RIGHT MCA or either ACA.  Posterior circulation: No significant stenosis, proximal occlusion,  aneurysm, or vascular malformation.  Venous sinuses: As permitted by contrast timing, patent.  Anatomic variants: None of significance.  Delayed phase:   No abnormal intracranial enhancement.  IMPRESSION: ~1.5 mm calcific embolus to the proximal inferior M2 division, LEFT MCA. This likely originated from severe calcific plaque in the LEFT common carotid artery or LEFT internal carotid artery. See discussion above.  No flow-limiting stenosis of the extracranial vasculature, although measurable luminal narrowing in the proximal LEFT ICA approaches 50%.  Foci of evolving cytotoxic edema in the LEFT hemisphere are difficult to appreciate. There is no interval hemorrhagic transformation.   Electronically Signed   By: Rolla Flatten M.D.   On: 02/02/2015 09:46   Ct Angio Neck W/cm &/or Wo/cm  02/02/2015   CLINICAL DATA:  Multiple acute LEFT MCA territory infarcts. Assess for proximal vascular lesion in this diabetic patient with end-stage renal disease, diabetes, hypertension, and chronic diastolic heart failure.  EXAM: CT ANGIOGRAPHY HEAD AND NECK  TECHNIQUE: Multidetector CT imaging of the head and neck was performed using the standard protocol during bolus administration of intravenous contrast. Multiplanar CT image reconstructions and MIPs were obtained to evaluate the vascular anatomy. Carotid stenosis measurements (when applicable) are obtained utilizing NASCET criteria, using the distal internal carotid diameter as the denominator.  CONTRAST:  124mL OMNIPAQUE IOHEXOL 350 MG/ML SOLN  COMPARISON:  MR brain 02/01/2015 CT head 01/31/2015.  FINDINGS: CT HEAD  Calvarium and skull base: No fracture or destructive lesion. Mastoids and middle ears are grossly clear.  Paranasal sinuses: Imaged portions are free of acute disease. Chronic LEFT maxillary sinus mucosal thickening is  evident.  Orbits: BILATERAL cataract extraction.  No masses.  Brain: No evidence of acute abnormality, including acute hemorrhage, hydrocephalus, or mass lesion. Generalized atrophy. Chronic microvascular ischemic change. Multifocal areas of acute LEFT hemisphere infarction more readily appreciated on MR than CT, with slight developing hypodensity in the LEFT insula, and LEFT temporal cortex. No interval hemorrhage.  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 Calcific plaque great vessel origins throughout. Imaged portion shows no evidence of aneurysm or dissection. No significant stenosis of the major arch vessel origins.  Right carotid system: No evidence of dissection, stenosis (50% or greater) or occlusion.  Left carotid system: Heavily calcified plaque over a 3 cm segment of the LEFT common carotid artery. Heavily calcified plaque over a 2 cm segment of LEFT internal carotid artery, with estimated 50% stenosis (2.2/4.5 proximal/distal ratio). No soft plaque is evident No evidence of dissection, or occlusion.  Vertebral arteries: LEFT vertebral dominant. Calcific plaque the C1 level not clearly flow reducing. No evidence of dissection, stenosis (50% or greater) or occlusion.  Nonvascular tissues: Prior CABG. Moderate vascular congestion. Ground-glass opacities at the RIGHT greater than LEFT lung apices. No pneumothorax. Vascular dialysis catheter. Chronic biapical pleural thickening. Spondylosis. No neck masses. Unremarkable thyroid.  CTA HEAD  Anterior circulation: Heavily calcified cavernous and supraclinoid RICA and LICA without flow reducing lesion. Within the proximal inferior M2 division of the LEFT middle cerebral artery, there is a ~1.5 mm intraluminal calcific embolus (image 11 series 13, image 235 series 8). This embolus does not occlude the vessel, but its caliber may be slightly diminished compared to the superior M2 division. There is no  obvious M3 or M4 branch occlusion. Its central location suggests embolic origin from the LEFT common carotid artery or LEFT internal carotid artery. It is unclear if neurointerventional retrieval is possible or  warranted. No similar lesions of the RIGHT MCA or either ACA.  Posterior circulation: No significant stenosis, proximal occlusion, aneurysm, or vascular malformation.  Venous sinuses: As permitted by contrast timing, patent.  Anatomic variants: None of significance.  Delayed phase:   No abnormal intracranial enhancement.  IMPRESSION: ~1.5 mm calcific embolus to the proximal inferior M2 division, LEFT MCA. This likely originated from severe calcific plaque in the LEFT common carotid artery or LEFT internal carotid artery. See discussion above.  No flow-limiting stenosis of the extracranial vasculature, although measurable luminal narrowing in the proximal LEFT ICA approaches 50%.  Foci of evolving cytotoxic edema in the LEFT hemisphere are difficult to appreciate. There is no interval hemorrhagic transformation.   Electronically Signed   By: Rolla Flatten M.D.   On: 02/02/2015 09:46   Mr Brain Wo Contrast  02/01/2015   CLINICAL DATA:  Patient developed aphasia 3-4 days ago. No reported RIGHT-sided weakness. Stroke risk factors include ESRD, hypertension, and diabetes.  EXAM: MRI HEAD WITHOUT CONTRAST  TECHNIQUE: Multiplanar, multiecho pulse sequences of the brain and surrounding structures were obtained without intravenous contrast.  COMPARISON:  CT head 01/31/2015 was unremarkable. Carotid Dopplers 02/01/2015 suggests BILATERAL non stenotic plaque.  FINDINGS: Multifocal areas of restricted diffusion throughout the LEFT hemisphere involving the temporal lobe, insula, frontal lobe, and parietal lobe, as well as the adjacent subcortical white matter, LEFT MCA territory distribution, non-confluent and nonhemorrhagic, suggesting a shower of emboli. No RIGHT-sided, brainstem, or cerebellar abnormalities.  No mass  lesion, hydrocephalus, or extra-axial fluid.  Generalized atrophy. Moderately advanced T2 and FLAIR hyperintensity throughout the white matter suggesting small vessel disease. No midline abnormalities. Flow voids are maintained. No evidence for large vessel occlusion.  Extracranial soft tissues unremarkable.  Compared with prior CT, these acute infarcts are not visible.  IMPRESSION: Multifocal areas of restricted diffusion, non-confluent and nonhemorrhagic throughout the LEFT hemisphere, LEFT MCA territory distribution, consistent with a shower of emboli.  Atrophy and small vessel disease.  No evidence for large vessel occlusion.   Electronically Signed   By: Rolla Flatten M.D.   On: 02/01/2015 12:23   US Carotid Bilateral  02/01/2015   CLINICAL DATA:  Stroke.  Hypertension, diabetes.  EXAM: BILATERAL CAROTID DUPLEX ULTRASOUND  TECHNIQUE: Pearline Cables scale imaging, color Doppler and duplex ultrasound was performed of bilateral carotid and vertebral arteries in the neck.  COMPARISON:  None.  REVIEW OF SYSTEMS: Quantification of carotid stenosis is based on velocity parameters that correlate the residual internal carotid diameter with NASCET-based stenosis levels, using the diameter of the distal internal carotid lumen as the denominator for stenosis measurement.  The following velocity measurements were obtained:  PEAK SYSTOLIC/END DIASTOLIC  RIGHT  ICA:                     93/14cm/sec  CCA:                     [id number]  SYSTOLIC ICA/CCA RATIO:  1.0  DIASTOLIC ICA/CCA RATIO: 1.0  ECA:                     127cm/sec  LEFT  ICA:                     93/15cm/sec  CCA:                     0000000  SYSTOLIC ICA/CCA RATIO:  1.0  DIASTOLIC ICA/CCA RATIO: 1.3  ECA:                     75cm/sec  FINDINGS: RIGHT CAROTID ARTERY: Eccentric plaque in the distal common carotid artery, bulb, and proximal ICA, without high-grade stenosis. Normal waveforms and color Doppler signal. ICA is tortuous.  RIGHT VERTEBRAL ARTERY:  Normal  flow direction and waveform.  LEFT CAROTID ARTERY: Calcified plaque in the mid common carotid artery resulting in at least mild stenosis. Eccentric calcified plaque in the distal common carotid, bulb, and proximal ICA with mild stenosis. Normal waveforms and color Doppler signal. ICA mildly tortuous.  LEFT VERTEBRAL ARTERY: Normal flow direction and waveform.  IMPRESSION: 1. Bilateral carotid bifurcation and proximal ICA plaque resulting in less than 50% diameter stenosis. The exam does not exclude plaque ulceration or embolization. Continued surveillance recommended.   Electronically Signed   By: Lucrezia Europe M.D.   On: 02/01/2015 11:39     ASSESSMENT AND PLAN:   #1 Acute L MCA multiple emboli Continue ASA, Statin. LDL Started on diet.  CTA showed L MCA narrowing. Continue dual anti-platelet therapy with ASA, Plavix for at least 90 days, per neurology recommendation. No intervention needed, but will get the vascular surgeon consultation inpatient or outpatient.   #2 end-stage renal disease on hemodialysis: Nephrology consultation, Next hemodialysis is tomorrow .  #3 diabetes mellitus type 2: Hemoglobin A1c is pending. Continue Lantus 38 units, 10 units of NovoLog with meals, sliding scale  #4 hypertension: Permissive HTN for CVA, continue current therapy   #5 chronic diastolic heart failure:  Euvolemic. Continue HD, stable. No exacerbation     All the records are reviewed and case discussed with Care Management/Social Workerr. Management plans discussed with the patient, family and they are in agreement.  CODE STATUS: FULL CODE  DVT Prophylaxis: Lovenox  TOTAL TIME TAKING CARE OF THIS PATIENT: 40 minutes.   PT recommended physical therapy in skilled nursing facility where patient will be discharged today , patient is to continue also speech therapist follow-up as outpatient    Demecia Northway M.D on 02/03/2015 at 10:30 AM  Between 7am to 6pm - Pager - [phone]  After 6pm go to  [email or web address] - password [email or web address] [facility]  Office  [phone]  CC: Primary care physician; Hortencia Pilar, MD

## 2015-02-03 NOTE — Care Management (Signed)
For discharge today to skilled nursing

## 2015-02-03 NOTE — Clinical Social Work Note (Signed)
CSW notified RN, facility [facility], pt, pt's family(daughter [name]) that pt would DC today to via EMS.  CSW signing off.

## 2015-02-03 NOTE — Progress Notes (Addendum)
Physical Therapy Treatment Patient Details Name: KRYSTALANN BALLOG MRN: [id number] DOB: [birth year] Today's Date: 02/03/2015    History of Present Illness Pt with confusion, difficulty speaking, MRI reveals emboli-shower (multi mini strokes)    PT Comments    Pt has mild confusion with following exercise directions, but improves with continued instruction. Pt/family educated on importance of rest and music therapy in acute phase (first seven days) post CVA. Pt fatigues quickly and wishes return to bed post session. Discussed recommendation of skilled nursing facility with social work.  Follow Up Recommendations  SNF     Equipment Recommendations       Recommendations for Other Services       Precautions / Restrictions Precautions Precautions: Fall Restrictions Weight Bearing Restrictions: No    Mobility  Bed Mobility Overal bed mobility: Modified Independent             General bed mobility comments:  (Increased time/effort with rails)  Transfers Overall transfer level: Modified independent Equipment used: 1 person hand held assist             General transfer comment: Without hand held assist; pt reaches for counter to stand. Requires cues for hand placement to and from stand  Ambulation/Gait Ambulation/Gait assistance: Min guard Ambulation Distance (Feet): 80 Feet Assistive device: None Gait Pattern/deviations: Step-through pattern (Guarded) Gait velocity: Reduced Gait velocity interpretation: <1.8 ft/sec, indicative of risk for recurrent falls General Gait Details: Ambulates slower than baseline and pt notes feeling more unsteady and heavy than normal.   Stairs            Wheelchair Mobility    Modified Rankin (Stroke Patients Only)       Balance Overall balance assessment: Needs assistance         Standing balance support: No upper extremity supported Standing balance-Leahy Scale: Fair Standing balance comment:  (Unable to perform stand  LE exercises without UE support)                    Cognition Arousal/Alertness: Awake/alert Behavior During Therapy: WFL for tasks assessed/performed Overall Cognitive Status: Within Functional Limits for tasks assessed                      Exercises General Exercises - Lower Extremity Ankle Circles/Pumps: AROM;Both;20 reps;Seated Long Arc Quad: AROM;Both;20 reps;Seated Hip ABduction/ADduction: AROM;Both;10 reps;Standing Straight Leg Raises: AROM;Both;10 reps;Standing (hip flexion and extension) Hip Flexion/Marching: AROM;Both;10 reps;Standing    General Comments        Pertinent Vitals/Pain Pain Assessment: No/denies pain    Home Living                      Prior Function            PT Goals (current goals can now be found in the care plan section) Progress towards PT goals: Progressing toward goals    Frequency  7X/week    PT Plan Current plan remains appropriate    Co-evaluation             End of Session Equipment Utilized During Treatment: Gait belt Activity Tolerance: Patient limited by fatigue;Patient limited by lethargy Patient left: in bed;with call bell/phone within reach;with bed alarm set;with family/visitor present     Time: [id number] PT Time Calculation (min) (ACUTE ONLY): 24 min  Charges:  $Gait Training: 8-22 mins $Therapeutic Exercise: 8-22 mins  G Codes:      Charlaine Dalton 02/03/2015, 10:27 AM

## 2015-02-04 LAB — MISC LABCORP TEST (SEND OUT): LABCORP TEST CODE: 6510

## 2015-02-05 ENCOUNTER — Ambulatory Visit: Admission: RE | Admit: 2015-02-05 | Payer: Medicare Other | Source: Ambulatory Visit | Admitting: Vascular Surgery

## 2015-02-05 SURGERY — DIALYSIS/PERMA CATHETER REMOVAL
Anesthesia: Moderate Sedation

## 2015-02-17 ENCOUNTER — Encounter: Payer: Self-pay | Admitting: *Deleted

## 2015-02-17 ENCOUNTER — Ambulatory Visit
Admission: RE | Admit: 2015-02-17 | Discharge: 2015-02-17 | Disposition: A | Discharge status: Home / Self Care | Payer: Medicare Other | Source: Ambulatory Visit | Attending: Vascular Surgery | Admitting: Vascular Surgery

## 2015-02-17 ENCOUNTER — Encounter
Admission: RE | Disposition: A | Discharge status: Home / Self Care | Payer: Medicare Other | Source: Ambulatory Visit | Attending: Vascular Surgery

## 2015-02-17 DIAGNOSIS — Z794 Long term (current) use of insulin: Secondary | ICD-10-CM | POA: Insufficient documentation

## 2015-02-17 DIAGNOSIS — E780 Pure hypercholesterolemia: Secondary | ICD-10-CM | POA: Insufficient documentation

## 2015-02-17 DIAGNOSIS — Z7982 Long term (current) use of aspirin: Secondary | ICD-10-CM | POA: Insufficient documentation

## 2015-02-17 DIAGNOSIS — I120 Hypertensive chronic kidney disease with stage 5 chronic kidney disease or end stage renal disease: Secondary | ICD-10-CM | POA: Insufficient documentation

## 2015-02-17 DIAGNOSIS — Z992 Dependence on renal dialysis: Secondary | ICD-10-CM | POA: Insufficient documentation

## 2015-02-17 DIAGNOSIS — I252 Old myocardial infarction: Secondary | ICD-10-CM | POA: Diagnosis not present

## 2015-02-17 DIAGNOSIS — N186 End stage renal disease: Secondary | ICD-10-CM | POA: Diagnosis present

## 2015-02-17 DIAGNOSIS — E119 Type 2 diabetes mellitus without complications: Secondary | ICD-10-CM | POA: Diagnosis not present

## 2015-02-17 DIAGNOSIS — Z888 Allergy status to other drugs, medicaments and biological substances status: Secondary | ICD-10-CM | POA: Insufficient documentation

## 2015-02-17 HISTORY — PX: PERIPHERAL VASCULAR CATHETERIZATION: SHX172C

## 2015-02-17 SURGERY — DIALYSIS/PERMA CATHETER REMOVAL
Anesthesia: Moderate Sedation

## 2015-02-17 SURGICAL SUPPLY — 1 items: KIT REMOVAL (MISCELLANEOUS) ×3 IMPLANT

## 2015-02-17 NOTE — H&P (Signed)
[facility] History & Physical Update  The patient was interviewed and re-examined.  The patient's previous History and Physical has been reviewed and is unchanged.  There is no change in the plan of care.  Jaquayla Hege, Dolores Lory, MD  02/17/2015, 8:20 AM

## 2015-02-17 NOTE — Progress Notes (Signed)
Pt here for port removal per Dr Delana Meyer, no complications noted, dresssing dry and intact, denies complaints, instructed to keep dressing on today, husband present, denies complaints, for discharge,

## 2015-02-18 ENCOUNTER — Encounter: Payer: Self-pay | Admitting: Vascular Surgery

## 2015-02-19 DIAGNOSIS — Z8673 Personal history of transient ischemic attack (TIA), and cerebral infarction without residual deficits: Secondary | ICD-10-CM | POA: Insufficient documentation

## 2015-03-03 ENCOUNTER — Encounter: Payer: Self-pay | Admitting: Pulmonary Disease

## 2015-03-03 ENCOUNTER — Ambulatory Visit (INDEPENDENT_AMBULATORY_CARE_PROVIDER_SITE_OTHER): Payer: Medicare Other | Admitting: Pulmonary Disease

## 2015-03-03 VITALS — BP 154/68 | HR 71 | Ht 63.0 in | Wt 229.0 lb

## 2015-03-03 DIAGNOSIS — Z9989 Dependence on other enabling machines and devices: Secondary | ICD-10-CM

## 2015-03-03 DIAGNOSIS — J4520 Mild intermittent asthma, uncomplicated: Secondary | ICD-10-CM

## 2015-03-03 DIAGNOSIS — E662 Morbid (severe) obesity with alveolar hypoventilation: Secondary | ICD-10-CM | POA: Diagnosis not present

## 2015-03-03 DIAGNOSIS — G4733 Obstructive sleep apnea (adult) (pediatric): Secondary | ICD-10-CM | POA: Diagnosis not present

## 2015-03-03 DIAGNOSIS — I639 Cerebral infarction, unspecified: Secondary | ICD-10-CM

## 2015-03-03 NOTE — Progress Notes (Signed)
  Chief Complaint  Patient presents with  . Follow-up    Pt stated since last seen she has had several TIAs and has resided at a skilled nursing facility for 2 weeks. Pts DME ([employer]) has gone out of business and is now transitioning to [employer]-- pt now has O2 from [place] but is waiting on the CPAP. Pt currently receiveing hemodialysis three times a week.     History of Present Illness: Dawn Bradford is a 79 y.o. female with multifactorial dyspnea, mild/intermittent asthma, OSA/OHS, deconditioning, and diastolic dysfunction.  Since I saw her last she was started on dialysis, and was in hospital for TIA.  She was d/c'ed to rehab.  While in rehab her CPAP was taken away.  She was given a loaner while in rehab, but then told she could take this home.  Her previous DME is longer functioning, and she was switched to [employer].  She has home oxygen set up again with [facility], but has not received CPAP yet >> she was told that a doctor's order was needed.  She is not using inhalers.  She denies cough, wheeze, or chest congestion.  Tests: PSG 01/05/06>>AHI 24.8  V/Q scan 01/26/10>>very low probability for PE PFT 02/05/10>>FEV1 1.38(77%), FEV1% 73, TLC 3.78(86%), DLCO 48%, +BD CT chest 05/31/11>>5 mm RUL nodule stable for 2 yrs Auto CPAP 08/08/11 to 08/22/11>>Used on 15 of 15 nights with average 10 hrs 6 min. Average AHI 3.8 with mean CPAP 8 cm H2O and 95th percentile CPAP 11 cm H2O. Auto CPAP 10/25/13 to 11/05/13 >> Used on 12 of 12 nights with average 10 hrs 46 min. Average AHI 3.3 with mean CPAP 9 cm H2O and 90 th percentile CPAP 12 cm H2O. Echo 02/01/15 >> EF 55 to 60%, PAS 43 mmHg   PMHx >> CAD, Diastolic CHF, HTN, HLD, ESRD, Hypothyroidism, ACE cough, PAD, TIA  PSHx, Medications, Allergies, Fhx, Shx reviewed.  Physical Exam: BP 154/68 mmHg  Pulse 71  Ht 5\' 3"  (1.6 m)  Wt 229 lb (103.874 kg)  BMI 40.58 kg/m2  SpO2 94%  LMP 02/01/1986 (Approximate)  General - No distress, wearing  oxygen ENT - No sinus tenderness, no oral exudate, no LAN Cardiac - s1s2 regular, 2/6 SM Chest - No wheeze/rales/dullness, good air entry, normal respiratory excursion Back - No focal tenderness Abd - Soft, non-tender Ext - AV graft Lt forearm Neuro - Normal strength Skin - No rashes Psych - Normal mood, and behavior  Assessment/Plan:  Obstructive sleep apnea. Plan: - will arrange for new auto CPAP through [employer]  Obesity hypoventilation syndrome. Plan: - continue 2 liters oxygen 24/7  Mild, intermittent asthma. Plan: - monitor off inhaler therapy   Chesley Mires, MD [facility] Pager:  [phone] 03/03/2015, 4:36 PM

## 2015-03-03 NOTE — Patient Instructions (Signed)
Follow up in 1 year.

## 2015-04-01 ENCOUNTER — Other Ambulatory Visit: Payer: Self-pay | Admitting: Pulmonary Disease

## 2015-04-01 DIAGNOSIS — E662 Morbid (severe) obesity with alveolar hypoventilation: Secondary | ICD-10-CM

## 2015-04-01 DIAGNOSIS — G4733 Obstructive sleep apnea (adult) (pediatric): Secondary | ICD-10-CM

## 2015-04-01 DIAGNOSIS — Z9989 Dependence on other enabling machines and devices: Principal | ICD-10-CM

## 2015-04-23 ENCOUNTER — Ambulatory Visit: Admission: RE | Admit: 2015-04-23 | Payer: Medicare Other | Source: Ambulatory Visit | Admitting: Vascular Surgery

## 2015-04-23 ENCOUNTER — Encounter: Admission: RE | Payer: Self-pay | Source: Ambulatory Visit

## 2015-04-23 SURGERY — A/V SHUNTOGRAM/FISTULAGRAM
Anesthesia: Moderate Sedation | Laterality: Left

## 2015-07-07 DIAGNOSIS — I69319 Unspecified symptoms and signs involving cognitive functions following cerebral infarction: Secondary | ICD-10-CM | POA: Insufficient documentation

## 2015-07-07 DIAGNOSIS — Z6838 Body mass index (BMI) 38.0-38.9, adult: Secondary | ICD-10-CM | POA: Insufficient documentation

## 2015-07-10 ENCOUNTER — Telehealth: Payer: Self-pay | Admitting: Cardiovascular Disease

## 2015-07-10 NOTE — Telephone Encounter (Signed)
3rd attempt  to schedule from recall list. LMOV to call office for scheduling.   Deleting recall.

## 2015-08-18 ENCOUNTER — Encounter: Payer: Self-pay | Admitting: Cardiovascular Disease

## 2015-08-18 ENCOUNTER — Ambulatory Visit (INDEPENDENT_AMBULATORY_CARE_PROVIDER_SITE_OTHER): Payer: Medicare Other | Admitting: Cardiovascular Disease

## 2015-08-18 VITALS — BP 130/60 | HR 66 | Ht 62.0 in | Wt 231.2 lb

## 2015-08-18 DIAGNOSIS — I6523 Occlusion and stenosis of bilateral carotid arteries: Secondary | ICD-10-CM

## 2015-08-18 DIAGNOSIS — I639 Cerebral infarction, unspecified: Secondary | ICD-10-CM

## 2015-08-18 DIAGNOSIS — I25119 Atherosclerotic heart disease of native coronary artery with unspecified angina pectoris: Secondary | ICD-10-CM

## 2015-08-18 DIAGNOSIS — R079 Chest pain, unspecified: Secondary | ICD-10-CM | POA: Diagnosis not present

## 2015-08-18 DIAGNOSIS — E785 Hyperlipidemia, unspecified: Secondary | ICD-10-CM

## 2015-08-18 DIAGNOSIS — I10 Essential (primary) hypertension: Secondary | ICD-10-CM | POA: Diagnosis not present

## 2015-08-18 MED ORDER — ISOSORBIDE MONONITRATE ER 30 MG PO TB24: 30.0000 mg | ORAL_TABLET | Freq: Every day | ORAL | Status: DC

## 2015-08-18 NOTE — Assessment & Plan Note (Signed)
Continue treatment with rosuvastatin with a target LDL of less than 70. 

## 2015-08-18 NOTE — Assessment & Plan Note (Signed)
The patient is having angina during dialysis. This could be due to drop in coronary perfusion. She has known history of coronary artery disease. Thus, I requested a pharmacologic nuclear stress test for evaluation. She is not able to exercise on a treadmill. I also decided to stop hydralazine and add Imdur 30 mg once daily. Decrease aspirin to 81 mg once daily. She is already on Plavix. Follow-up in one month to reevaluate symptoms.

## 2015-08-18 NOTE — Progress Notes (Signed)
HPI  This is a 79 year old with history of coronary artery disease status post CABG, resistant hypertension, renal artery stenosis, morbid obesity, diastolic congestive heart failure, end stage renal disease on hemodialysis, and OHS/OSA who presents to Cardiology Clinic for followup. Echocardiogram in May, 2016  showed normal LV systolic function, aortic sclerosis without significant stenosis and mild pulmonary hypertension.  Pharmacologic nuclear stress test also in January of 2015 showed possible mild ischemia in the distal anterior wall versus shifting breast attenuation. Ejection fraction was normal.  She was hospitalized in May for stroke. She was discharged to rehabilitation. She reports recent episodes of substernal chest tightness radiating to her back which is mainly happening during dialysis. She had one episode yesterday that lasted 15 minutes. She doesn't have much exertional symptoms as her physical capacity is very limited. She walks slowly.   Allergies  Allergen Reactions  . Ace Inhibitors     unknown  . Amlodipine   . Amlodipine Besylate     "something happened to gums"  . Clonidine Hydrochloride Other (See Comments)    Unknown reaction.  . Codeine Hives  . Metformin     REACTION: vomiting     Current Outpatient Prescriptions on File Prior to Visit  Medication Sig Dispense Refill  . aspirin 325 MG tablet Take 1 tablet (325 mg total) by mouth daily. 30 tablet 0  . carvedilol (COREG) 25 MG tablet TAKE ONE TABLET BY MOUTH TWICE DAILY WITH MEALS 180 tablet 3  . Cholecalciferol (D 5000 PO) Take 5,000 Int'l Units by mouth daily.     . clopidogrel (PLAVIX) 75 MG tablet Take 1 tablet (75 mg total) by mouth daily. 30 tablet 2  . FLUoxetine (PROZAC) 10 MG capsule Take 20 mg by mouth at bedtime.   3  . furosemide (LASIX) 40 MG tablet Take 40 mg by mouth. Takes 1 tablet on Tuesday, Thursday, Saturday and Sundays.    [name] HUMALOG KWIKPEN 100 UNIT/ML SOPN Inject 10 Units into the  skin 2 (two) times daily before a meal. Use as directed as needed depending on blood sugar.    . hydrALAZINE (APRESOLINE) 25 MG tablet Take 25 mg by mouth 2 (two) times daily.     . Insulin Glargine (LANTUS) 100 UNIT/ML Solostar Pen Inject 30 Units into the skin at bedtime.     [name] levothyroxine (SYNTHROID, LEVOTHROID) 75 MCG tablet Take 75 mcg by mouth daily before breakfast.    . lidocaine-prilocaine (EMLA) cream Apply 1 application topically as needed (topical anesthesia for hemodialysis if Gebauers and Lidocaine injection are ineffective.). 30 g 0  . MICARDIS 80 MG tablet TAKE ONE TABLET BY MOUTH EVERY DAY 30 each 5  . nystatin (MYCOSTATIN/NYSTOP) 100000 UNIT/GM POWD Apply 100,000 g topically daily as needed (for rash and redness.).     [name] rosuvastatin (CRESTOR) 40 MG tablet Take 40 mg by mouth at bedtime.    . traZODone (DESYREL) 50 MG tablet Take 50 mg by mouth at bedtime.     . triamcinolone (KENALOG) 0.5 % cream Apply 1 application topically daily as needed (for rash and redness.).      No current facility-administered medications on file prior to visit.     Past Medical History  Diagnosis Date  . Hyperlipidemia   . Hypertension     resistant hyptertension times many years. the patient does have renal artery stenosis. she has tried calcium channel blockers in the past and states that she would not take them now because she had  some problems with her gums which her dentist identified as calcium-channel blocker side effects.  . Diabetes mellitus     type 2  . Coronary artery disease     s/p anterior MI in 1996 followed by CABG. Lexiscan myoview (4/11): EF 67%, normal perfusion with no evidence for ischemia or infarction.   . Renal artery stenosis [facility])     The patient has an occluded right renal artery and an atrophic right kidney. There is 20% left renal artery stenosis. This was seen by catheterization in 2007  . Diastolic heart failure     most recent echo (12/10) showed EF 55-60%  with mild LVH, grade I diastolic dysfunction, mild LAE.  [name] Aortic valve disorder     Echo in 2009 showed mean aortic valve gradient of 13 mmHg, suggesting very mild stenosis. Echo (12/10) suggested aortic sclerosis only  . Obesity   . Chronic kidney disease     most recent creatinine was 1.2  . Obstructive sleep apnea     PSG 01/05/2006 AHI 24.8, CPAP 9cm H2O  . Mild asthma     PFT 02/05/10 FEV1 1.38, FEV1% 73, TLC 3.78 (86%), DLCO 48%, +BD  . ACE-inhibitor cough   . Hypothyroidism   . Obesity hypoventilation syndrome ([place])        . Pulmonary nodule, right   . Carotid artery disease (HCC)     mild, carotid dopplers 12/2009  . Myocardial infarction ([place])   . CHF (congestive heart failure) ([place])   . Renal insufficiency   . Stroke [facility])      Past Surgical History  Procedure Laterality Date  . Tubal ligation  1973  . Inner ear surgery  1979    right  . Abdominal hysterectomy  1985  . Coronary artery bypass graft  1996  . Cataract extraction  2010  . Av fistula placement    . Dialysis fistula creation    . Peripheral vascular catheterization Left 01/06/2015    Procedure: A/V Shuntogram/Fistulagram;  Surgeon: Katha Cabal, MD;  Location: [place] [facility];  Service: Cardiovascular;  Laterality: Left;  . Peripheral vascular catheterization N/A 02/17/2015    Procedure: Dialysis/Perma Catheter Removal;  Surgeon: Katha Cabal, MD;  Location: [place] [facility];  Service: Cardiovascular;  Laterality: N/A;     Family History  Problem Relation Age of Onset  . Kidney failure Mother   . Diabetes Mother   . Aortic aneurysm Father   . Coronary artery disease Father   . Heart attack Father   . Diabetes Sister   . Diabetes Brother   . Kidney failure Brother      Social History   Social History  . Marital Status: Married    Spouse Name: N/A  . Number of Children: N/A  . Years of Education: N/A   Occupational History  . Not on file.   Social History Main Topics  .  Smoking status: Never Smoker   . Smokeless tobacco: Never Used  . Alcohol Use: No  . Drug Use: No  . Sexual Activity: Not on file   Other Topics Concern  . Not on file   Social History Narrative      PHYSICAL EXAM   BP 130/60 mmHg  Pulse 66  Ht 5\' 2"  (1.575 m)  Wt 231 lb 4 oz (104.894 kg)  BMI 42.29 kg/m2  LMP 02/01/1986 (Approximate) Constitutional: She is oriented to person, place, and time. She appears well-developed and well-nourished. No distress.  HENT:  No nasal discharge.  Head: Normocephalic and atraumatic.  Eyes: Pupils are equal and round. Right eye exhibits no discharge. Left eye exhibits no discharge.  Neck: Normal range of motion. Neck supple. No JVD present. No thyromegaly present.  Cardiovascular: Normal rate, regular rhythm, normal heart sounds. Exam reveals no gallop and no friction rub. There is a 2/6 systolic ejection murmur at the aortic area which is early peaking with preserved S2.  Pulmonary/Chest: Effort normal and breath sounds normal. No stridor. No respiratory distress. She has no wheezes. She has no rales. She exhibits no tenderness.  Abdominal: Soft. Bowel sounds are normal. She exhibits no distension. There is no tenderness. There is no rebound and no guarding.  Musculoskeletal: Normal range of motion. She exhibits +1 edema and no tenderness.  Neurological: She is alert and oriented to person, place, and time. Coordination normal.  Skin: Skin is warm and dry. No rash noted. She is not diaphoretic. No erythema. No pallor.  Psychiatric: She has a normal mood and affect. Her behavior is normal. Judgment and thought content normal.     EKG: Normal sinus rhythm with no significant ST or T wave changes.   ASSESSMENT AND PLAN

## 2015-08-18 NOTE — Assessment & Plan Note (Signed)
This was moderate by carotid Doppler

## 2015-08-18 NOTE — Assessment & Plan Note (Signed)
Blood pressure is controlled on current medication. I made the changes outlined above in order to improve her angina.

## 2015-08-18 NOTE — Patient Instructions (Addendum)
Medication Instructions:  Your physician has recommended you make the following change in your medication:  STOP taking hydralazine START taking Imdur 30mg  daily   Labwork: none  Testing/Procedures: Your physician has requested that you have a lexiscan myoview. For further information please visit [email or web address]. Please follow instruction sheet, as given.  [place]  Your caregiver has ordered a Stress Test with nuclear imaging. The purpose of this test is to evaluate the blood supply to your heart muscle. This procedure is referred to as a "Non-Invasive Stress Test." This is because other than having an IV started in your vein, nothing is inserted or "invades" your body. Cardiac stress tests are done to find areas of poor blood flow to the heart by determining the extent of coronary artery disease (CAD). Some patients exercise on a treadmill, which naturally increases the blood flow to your heart, while others who are  unable to walk on a treadmill due to physical limitations have a pharmacologic/chemical stress agent called Lexiscan . This medicine will mimic walking on a treadmill by temporarily increasing your coronary blood flow.   Please note: these test may take anywhere between 2-4 hours to complete  PLEASE REPORT TO [facility] AT THE FIRST DESK WILL DIRECT YOU WHERE TO GO  Date of Procedure:__Thurs., Dec 22, 8:30am_________________________  Arrival Time for Procedure:___8:15am________________  Instructions regarding medication:   __xx__ : Hold diabetes medication morning of procedure. Only take 1/2 dose of evening insulin the night before your test.   _xx___:  Hold coreg night before procedure and morning of procedure _xx_: Hold lasix the morning of your procedure.   PLEASE NOTIFY THE OFFICE AT LEAST 50 HOURS IN ADVANCE IF YOU ARE UNABLE TO KEEP YOUR APPOINTMENT.  [phone] AND  PLEASE NOTIFY NUCLEAR MEDICINE AT [facility] AT LEAST 24  HOURS IN ADVANCE IF YOU ARE UNABLE TO KEEP YOUR APPOINTMENT. [phone]  How to prepare for your Myoview test:   Do not eat or drink after midnight  No caffeine for 24 hours prior to test  No smoking 24 hours prior to test.  Your medication may be taken with water.  If your doctor stopped a medication because of this test, do not take that medication.  Ladies, please do not wear dresses.  Skirts or pants are appropriate. Please wear a short sleeve shirt.  No perfume, cologne or lotion.  Wear comfortable walking shoes. No heels!            Follow-Up: Your physician recommends that you schedule a follow-up appointment in: one month with Dr. Fletcher Anon.    Any Other Special Instructions Will Be Listed Below (If Applicable).     If you need a refill on your cardiac medications before your next appointment, please call your pharmacy.  Cardiac Nuclear Scanning A cardiac nuclear scan is used to check your heart for problems, such as the following:  A portion of the heart is not getting enough blood.  Part of the heart muscle has died, which happens with a heart attack.  The heart wall is not working normally.  In this test, a radioactive dye (tracer) is injected into your bloodstream. After the tracer has traveled to your heart, a scanning device is used to measure how much of the tracer is absorbed by or distributed to various areas of your heart. LET [facility] CARE PROVIDER KNOW ABOUT:  Any allergies you have.  All medicines you are taking, including vitamins, herbs, eye drops, creams, and  over-the-counter medicines.  Previous problems you or members of your family have had with the use of anesthetics.  Any blood disorders you have.  Previous surgeries you have had.  Medical conditions you have.  RISKS AND COMPLICATIONS Generally, this is a safe procedure. However, as with any procedure, problems can occur. Possible problems include:   Serious chest  pain.  Rapid heartbeat.  Sensation of warmth in your chest. This usually passes quickly. BEFORE THE PROCEDURE Ask your health care provider about changing or stopping your regular medicines. PROCEDURE This procedure is usually done at a hospital and takes 2-4 hours.  An IV tube is inserted into one of your veins.  Your health care provider will inject a small amount of radioactive tracer through the tube.  You will then wait for 20-40 minutes while the tracer travels through your bloodstream.  You will lie down on an exam table so images of your heart can be taken. Images will be taken for about 15-20 minutes.  You will exercise on a treadmill or stationary bike. While you exercise, your heart activity will be monitored with an electrocardiogram (ECG), and your blood pressure will be checked.  If you are unable to exercise, you may be given a medicine to make your heart beat faster.  When blood flow to your heart has peaked, tracer will again be injected through the IV tube.  After 20-40 minutes, you will get back on the exam table and have more images taken of your heart.  When the procedure is over, your IV tube will be removed. AFTER THE PROCEDURE  You will likely be able to leave shortly after the test. Unless your health care provider tells you otherwise, you may return to your normal schedule, including diet, activities, and medicines.  Make sure you find out how and when you will get your test results.   This information is not intended to replace advice given to you by your health care provider. Make sure you discuss any questions you have with your health care provider.   Document Released: 09/16/2004 Document Revised: 08/27/2013 Document Reviewed: 07/31/2013 Elsevier Interactive Patient Education [employer].

## 2015-08-19 ENCOUNTER — Other Ambulatory Visit: Payer: Self-pay

## 2015-08-19 ENCOUNTER — Observation Stay
Admission: EM | Admit: 2015-08-19 | Discharge: 2015-08-20 | Disposition: A | Discharge status: Home / Self Care | Payer: Medicare Other | Attending: Internal Medicine | Admitting: Internal Medicine

## 2015-08-19 ENCOUNTER — Encounter: Payer: Self-pay | Admitting: Intensive Care

## 2015-08-19 ENCOUNTER — Emergency Department: Payer: Medicare Other

## 2015-08-19 DIAGNOSIS — R0602 Shortness of breath: Secondary | ICD-10-CM | POA: Insufficient documentation

## 2015-08-19 DIAGNOSIS — Z7902 Long term (current) use of antithrombotics/antiplatelets: Secondary | ICD-10-CM | POA: Diagnosis not present

## 2015-08-19 DIAGNOSIS — Z8673 Personal history of transient ischemic attack (TIA), and cerebral infarction without residual deficits: Secondary | ICD-10-CM | POA: Insufficient documentation

## 2015-08-19 DIAGNOSIS — I5032 Chronic diastolic (congestive) heart failure: Secondary | ICD-10-CM | POA: Insufficient documentation

## 2015-08-19 DIAGNOSIS — I132 Hypertensive heart and chronic kidney disease with heart failure and with stage 5 chronic kidney disease, or end stage renal disease: Secondary | ICD-10-CM | POA: Insufficient documentation

## 2015-08-19 DIAGNOSIS — I69320 Aphasia following cerebral infarction: Secondary | ICD-10-CM | POA: Diagnosis not present

## 2015-08-19 DIAGNOSIS — J4520 Mild intermittent asthma, uncomplicated: Secondary | ICD-10-CM | POA: Diagnosis not present

## 2015-08-19 DIAGNOSIS — R55 Syncope and collapse: Secondary | ICD-10-CM | POA: Insufficient documentation

## 2015-08-19 DIAGNOSIS — E662 Morbid (severe) obesity with alveolar hypoventilation: Secondary | ICD-10-CM | POA: Insufficient documentation

## 2015-08-19 DIAGNOSIS — Z79899 Other long term (current) drug therapy: Secondary | ICD-10-CM | POA: Insufficient documentation

## 2015-08-19 DIAGNOSIS — I252 Old myocardial infarction: Secondary | ICD-10-CM | POA: Diagnosis not present

## 2015-08-19 DIAGNOSIS — Z992 Dependence on renal dialysis: Secondary | ICD-10-CM | POA: Insufficient documentation

## 2015-08-19 DIAGNOSIS — R079 Chest pain, unspecified: Secondary | ICD-10-CM | POA: Diagnosis present

## 2015-08-19 DIAGNOSIS — N186 End stage renal disease: Secondary | ICD-10-CM | POA: Diagnosis not present

## 2015-08-19 DIAGNOSIS — I701 Atherosclerosis of renal artery: Secondary | ICD-10-CM | POA: Insufficient documentation

## 2015-08-19 DIAGNOSIS — Z7982 Long term (current) use of aspirin: Secondary | ICD-10-CM | POA: Insufficient documentation

## 2015-08-19 DIAGNOSIS — Z951 Presence of aortocoronary bypass graft: Secondary | ICD-10-CM | POA: Insufficient documentation

## 2015-08-19 DIAGNOSIS — Z794 Long term (current) use of insulin: Secondary | ICD-10-CM | POA: Diagnosis not present

## 2015-08-19 DIAGNOSIS — Z833 Family history of diabetes mellitus: Secondary | ICD-10-CM | POA: Insufficient documentation

## 2015-08-19 DIAGNOSIS — E039 Hypothyroidism, unspecified: Secondary | ICD-10-CM | POA: Diagnosis not present

## 2015-08-19 DIAGNOSIS — Z90710 Acquired absence of both cervix and uterus: Secondary | ICD-10-CM | POA: Diagnosis not present

## 2015-08-19 DIAGNOSIS — Z885 Allergy status to narcotic agent status: Secondary | ICD-10-CM | POA: Diagnosis not present

## 2015-08-19 DIAGNOSIS — Z8249 Family history of ischemic heart disease and other diseases of the circulatory system: Secondary | ICD-10-CM | POA: Insufficient documentation

## 2015-08-19 DIAGNOSIS — D631 Anemia in chronic kidney disease: Secondary | ICD-10-CM | POA: Insufficient documentation

## 2015-08-19 DIAGNOSIS — E785 Hyperlipidemia, unspecified: Secondary | ICD-10-CM | POA: Insufficient documentation

## 2015-08-19 DIAGNOSIS — I25110 Atherosclerotic heart disease of native coronary artery with unstable angina pectoris: Principal | ICD-10-CM | POA: Insufficient documentation

## 2015-08-19 DIAGNOSIS — Z841 Family history of disorders of kidney and ureter: Secondary | ICD-10-CM | POA: Insufficient documentation

## 2015-08-19 DIAGNOSIS — Z888 Allergy status to other drugs, medicaments and biological substances status: Secondary | ICD-10-CM | POA: Diagnosis not present

## 2015-08-19 DIAGNOSIS — E1122 Type 2 diabetes mellitus with diabetic chronic kidney disease: Secondary | ICD-10-CM | POA: Diagnosis not present

## 2015-08-19 DIAGNOSIS — G4733 Obstructive sleep apnea (adult) (pediatric): Secondary | ICD-10-CM | POA: Insufficient documentation

## 2015-08-19 HISTORY — DX: Dependence on renal dialysis: Z99.2

## 2015-08-19 HISTORY — DX: End stage renal disease: N18.6

## 2015-08-19 LAB — COMPREHENSIVE METABOLIC PANEL
ALT: 14 U/L (ref 14–54)
ANION GAP: 8 (ref 5–15)
AST: 16 U/L (ref 15–41)
Albumin: 3.7 g/dL (ref 3.5–5.0)
Alkaline Phosphatase: 89 U/L (ref 38–126)
BUN: 30 mg/dL — ABNORMAL HIGH (ref 6–20)
CHLORIDE: 98 mmol/L — AB (ref 101–111)
CO2: 27 mmol/L (ref 22–32)
Calcium: 8.8 mg/dL — ABNORMAL LOW (ref 8.9–10.3)
Creatinine, Ser: 2.45 mg/dL — ABNORMAL HIGH (ref 0.44–1.00)
GFR calc non Af Amer: 18 mL/min — ABNORMAL LOW (ref >60)
GFR, EST AFRICAN AMERICAN: 21 mL/min — AB (ref >60)
Glucose, Bld: 109 mg/dL — ABNORMAL HIGH (ref 65–99)
Potassium: 3.8 mmol/L (ref 3.5–5.1)
SODIUM: 133 mmol/L — AB (ref 135–145)
Total Bilirubin: 0.6 mg/dL (ref 0.3–1.2)
Total Protein: 6.5 g/dL (ref 6.5–8.1)

## 2015-08-19 LAB — CBC WITH DIFFERENTIAL/PLATELET
BASOS ABS: 0.1 10*3/uL (ref 0–0.1)
BASOS PCT: 1 %
Eosinophils Absolute: 0.7 10*3/uL (ref 0–0.7)
Eosinophils Relative: 9 %
HCT: 31.2 % — ABNORMAL LOW (ref 35.0–47.0)
Hemoglobin: 10.5 g/dL — ABNORMAL LOW (ref 12.0–16.0)
LYMPHS PCT: 16 %
Lymphs Abs: 1.3 10*3/uL (ref 1.0–3.6)
MCH: 33.5 pg (ref 26.0–34.0)
MCHC: 33.8 g/dL (ref 32.0–36.0)
MCV: 99.2 fL (ref 80.0–100.0)
MONO ABS: 0.6 10*3/uL (ref 0.2–0.9)
MONOS PCT: 7 %
NEUTROS ABS: 5.4 10*3/uL (ref 1.4–6.5)
NEUTROS PCT: 67 %
PLATELETS: 118 10*3/uL — AB (ref 150–440)
RBC: 3.14 MIL/uL — AB (ref 3.80–5.20)
RDW: 13.7 % (ref 11.5–14.5)
WBC: 8 10*3/uL (ref 3.6–11.0)

## 2015-08-19 LAB — MRSA PCR SCREENING: MRSA by PCR: NEGATIVE

## 2015-08-19 LAB — TROPONIN I: Troponin I: <0.03 ng/mL (ref <0.031)

## 2015-08-19 LAB — GLUCOSE, CAPILLARY: GLUCOSE-CAPILLARY: 149 mg/dL — AB (ref 65–99)

## 2015-08-19 MED ORDER — INSULIN GLARGINE 100 UNIT/ML ~~LOC~~ SOLN
30.0000 [IU] | Freq: Every day | SUBCUTANEOUS | Status: DC
  Filled 2015-08-19 (×2): qty 0.3

## 2015-08-19 MED ORDER — LEVOTHYROXINE SODIUM 75 MCG PO TABS
75.0000 ug | ORAL_TABLET | Freq: Every day | ORAL | Status: DC
  Administered 2015-08-20: 75 ug via ORAL
  Filled 2015-08-19: qty 1

## 2015-08-19 MED ORDER — INSULIN ASPART 100 UNIT/ML ~~LOC~~ SOLN: 0.0000 [IU] | Freq: Every day | SUBCUTANEOUS | Status: DC

## 2015-08-19 MED ORDER — SODIUM CHLORIDE 0.9 % IJ SOLN: 3.0000 mL | INTRAMUSCULAR | Status: DC | PRN

## 2015-08-19 MED ORDER — INSULIN ASPART 100 UNIT/ML ~~LOC~~ SOLN: 3.0000 [IU] | Freq: Three times a day (TID) | SUBCUTANEOUS | Status: DC

## 2015-08-19 MED ORDER — FLUOXETINE HCL 20 MG PO CAPS
20.0000 mg | ORAL_CAPSULE | Freq: Every day | ORAL | Status: DC
  Administered 2015-08-19 – 2015-08-20 (×2): 20 mg via ORAL
  Filled 2015-08-19 (×2): qty 1

## 2015-08-19 MED ORDER — ISOSORBIDE MONONITRATE ER 30 MG PO TB24: 30.0000 mg | ORAL_TABLET | Freq: Every day | ORAL | Status: DC

## 2015-08-19 MED ORDER — SODIUM CHLORIDE 0.9 % IJ SOLN: 3.0000 mL | Freq: Two times a day (BID) | INTRAMUSCULAR | Status: DC

## 2015-08-19 MED ORDER — ACETAMINOPHEN 650 MG RE SUPP: 650.0000 mg | Freq: Four times a day (QID) | RECTAL | Status: DC | PRN

## 2015-08-19 MED ORDER — IRBESARTAN 75 MG PO TABS
300.0000 mg | ORAL_TABLET | Freq: Every day | ORAL | Status: DC
  Administered 2015-08-20: 300 mg via ORAL
  Filled 2015-08-19: qty 4

## 2015-08-19 MED ORDER — FUROSEMIDE 40 MG PO TABS
40.0000 mg | ORAL_TABLET | ORAL | Status: DC
  Administered 2015-08-20: 40 mg via ORAL
  Filled 2015-08-19: qty 1

## 2015-08-19 MED ORDER — TRAZODONE HCL 50 MG PO TABS
50.0000 mg | ORAL_TABLET | Freq: Every day | ORAL | Status: DC
  Administered 2015-08-19: 50 mg via ORAL
  Filled 2015-08-19: qty 1

## 2015-08-19 MED ORDER — ALBUTEROL SULFATE (2.5 MG/3ML) 0.083% IN NEBU: 2.5000 mg | INHALATION_SOLUTION | Freq: Four times a day (QID) | RESPIRATORY_TRACT | Status: DC | PRN

## 2015-08-19 MED ORDER — ACETAMINOPHEN 500 MG PO TABS
1000.0000 mg | ORAL_TABLET | Freq: Four times a day (QID) | ORAL | Status: DC | PRN
  Administered 2015-08-20: 1000 mg via ORAL
  Filled 2015-08-19: qty 2

## 2015-08-19 MED ORDER — INSULIN ASPART 100 UNIT/ML ~~LOC~~ SOLN
0.0000 [IU] | Freq: Three times a day (TID) | SUBCUTANEOUS | Status: DC
  Administered 2015-08-20: 3 [IU] via SUBCUTANEOUS
  Filled 2015-08-19: qty 2

## 2015-08-19 MED ORDER — ACETAMINOPHEN 325 MG PO TABS: 650.0000 mg | ORAL_TABLET | Freq: Four times a day (QID) | ORAL | Status: DC | PRN

## 2015-08-19 MED ORDER — MORPHINE SULFATE (PF) 2 MG/ML IV SOLN
2.0000 mg | Freq: Once | INTRAVENOUS | Status: AC
  Administered 2015-08-19: 2 mg via INTRAVENOUS
  Filled 2015-08-19: qty 1

## 2015-08-19 MED ORDER — ASPIRIN EC 81 MG PO TBEC
81.0000 mg | DELAYED_RELEASE_TABLET | Freq: Every day | ORAL | Status: DC
  Administered 2015-08-20: 81 mg via ORAL
  Filled 2015-08-19: qty 1

## 2015-08-19 MED ORDER — HEPARIN SODIUM (PORCINE) 5000 UNIT/ML IJ SOLN
5000.0000 [IU] | Freq: Three times a day (TID) | INTRAMUSCULAR | Status: DC
  Administered 2015-08-19 – 2015-08-20 (×3): 5000 [IU] via SUBCUTANEOUS
  Filled 2015-08-19 (×3): qty 1

## 2015-08-19 MED ORDER — CARVEDILOL 25 MG PO TABS
25.0000 mg | ORAL_TABLET | Freq: Two times a day (BID) | ORAL | Status: DC
  Administered 2015-08-20: 25 mg via ORAL
  Filled 2015-08-19: qty 1

## 2015-08-19 MED ORDER — CLOPIDOGREL BISULFATE 75 MG PO TABS
75.0000 mg | ORAL_TABLET | Freq: Every day | ORAL | Status: DC
  Administered 2015-08-20: 75 mg via ORAL
  Filled 2015-08-19: qty 1

## 2015-08-19 MED ORDER — ONDANSETRON HCL 4 MG/2ML IJ SOLN
4.0000 mg | Freq: Once | INTRAMUSCULAR | Status: AC
  Administered 2015-08-19: 4 mg via INTRAVENOUS
  Filled 2015-08-19: qty 2

## 2015-08-19 MED ORDER — SODIUM CHLORIDE 0.9 % IV SOLN: 250.0000 mL | INTRAVENOUS | Status: DC | PRN

## 2015-08-19 MED ORDER — SODIUM CHLORIDE 0.9 % IJ SOLN
3.0000 mL | Freq: Two times a day (BID) | INTRAMUSCULAR | Status: DC
  Administered 2015-08-19: 3 mL via INTRAVENOUS

## 2015-08-19 MED ORDER — ROSUVASTATIN CALCIUM 20 MG PO TABS
40.0000 mg | ORAL_TABLET | Freq: Every day | ORAL | Status: DC
  Administered 2015-08-19: 40 mg via ORAL
  Filled 2015-08-19: qty 2

## 2015-08-19 MED ORDER — MORPHINE SULFATE (PF) 2 MG/ML IV SOLN
2.0000 mg | INTRAVENOUS | Status: DC | PRN
  Administered 2015-08-20: 2 mg via INTRAVENOUS
  Filled 2015-08-19: qty 1

## 2015-08-19 MED ORDER — HYDRALAZINE HCL 25 MG PO TABS
25.0000 mg | ORAL_TABLET | Freq: Three times a day (TID) | ORAL | Status: DC
  Administered 2015-08-20: 25 mg via ORAL
  Filled 2015-08-19: qty 1

## 2015-08-19 NOTE — Progress Notes (Signed)
Patient was send from chronic unit with both needles in place.Fistula needles removed and hemastasis achieved,sites dressed with gauze/taped.Patient tolerated well.

## 2015-08-19 NOTE — Progress Notes (Signed)
Pt doesn't wish to wear home CPAP. Pt or family will call if she changes her mind

## 2015-08-19 NOTE — ED Notes (Signed)
Patient arrived by EMS from dialysis. Patient was at dialysis about an hour and started having severe chest pain that radiates to the back. Patient was feeling SOB and placed on O2 by ems. Patient O2 on RA is 96.

## 2015-08-19 NOTE — Progress Notes (Signed)
Pt's family brought in home cpap machine.  MD. Dr. Lavetta Nielsen notified for cpap order.  MD to place order.  Will continue to monitor. Jessee Avers

## 2015-08-19 NOTE — H&P (Signed)
[place] [facility] [place] NAME: Dawn Bradford    MR#:  [id number]  DATE OF BIRTH:  [birth year]  DATE OF ADMISSION:  08/19/2015  PRIMARY CARE PHYSICIAN: Hortencia Pilar, MD   REQUESTING/REFERRING PHYSICIAN:   CHIEF COMPLAINT:   Chief Complaint  Patient presents with  . Chest Pain    HISTORY OF PRESENT ILLNESS: Dawn Bradford  is a 79 y.o. female with a known history of coronary artery disease status post MI, status post coronary artery bypass grafting, end-stage renal disease, hemodialysis Mondays, Wednesdays, Fridays who presents to the hospital with  complaints of chest pains with hemodialysis. Patient tells me that she's been having chest pains for the past one week, worsening on hemodialysis. Today she started having pain. It was very intense than on 10 by intensity and radiated to the back, it is getting progressively longer and more intense, accompanied by nausea as well as feeling presyncopal. On arrival to the hospital. Patient's EKG showed normal sinus rhythm with no acute ST-T changes, hospitalist services were contacted for admission,   PAST MEDICAL HISTORY:   Past Medical History  Diagnosis Date  . Hyperlipidemia   . Hypertension     resistant hyptertension times many years. the patient does have renal artery stenosis. she has tried calcium channel blockers in the past and states that she would not take them now because she had some problems with her gums which her dentist identified as calcium-channel blocker side effects.  . Diabetes mellitus     type 2  . Coronary artery disease     s/p anterior MI in 1996 followed by CABG. Lexiscan myoview (4/11): EF 67%, normal perfusion with no evidence for ischemia or infarction.   . Renal artery stenosis [facility])     The patient has an occluded right renal artery and an atrophic right kidney. There is 20% left renal artery stenosis. This was seen by catheterization in 2007  . Diastolic heart failure      most recent echo (12/10) showed EF 55-60% with mild LVH, grade I diastolic dysfunction, mild LAE.  [name] Aortic valve disorder     Echo in 2009 showed mean aortic valve gradient of 13 mmHg, suggesting very mild stenosis. Echo (12/10) suggested aortic sclerosis only  . Obesity   . Chronic kidney disease     most recent creatinine was 1.2  . Obstructive sleep apnea     PSG 01/05/2006 AHI 24.8, CPAP 9cm H2O  . Mild asthma     PFT 02/05/10 FEV1 1.38, FEV1% 73, TLC 3.78 (86%), DLCO 48%, +BD  . ACE-inhibitor cough   . Hypothyroidism   . Obesity hypoventilation syndrome ([place])        . Pulmonary nodule, right   . Carotid artery disease (HCC)     mild, carotid dopplers 12/2009  . Myocardial infarction ([place])   . CHF (congestive heart failure) ([place])   . Renal insufficiency   . Stroke [facility])     PAST SURGICAL HISTORY:  Past Surgical History  Procedure Laterality Date  . Tubal ligation  1973  . Inner ear surgery  1979    right  . Abdominal hysterectomy  1985  . Coronary artery bypass graft  1996  . Cataract extraction  2010  . Av fistula placement    . Dialysis fistula creation    . Peripheral vascular catheterization Left 01/06/2015    Procedure: A/V Shuntogram/Fistulagram;  Surgeon: Katha Cabal, MD;  Location: [facility] INVASIVE CV  LAB;  Service: Cardiovascular;  Laterality: Left;  . Peripheral vascular catheterization N/A 02/17/2015    Procedure: Dialysis/Perma Catheter Removal;  Surgeon: Katha Cabal, MD;  Location: [place] CV LAB;  Service: Cardiovascular;  Laterality: N/A;    SOCIAL HISTORY:  Social History  Substance Use Topics  . Smoking status: Never Smoker   . Smokeless tobacco: Never Used  . Alcohol Use: No    FAMILY HISTORY:  Family History  Problem Relation Age of Onset  . Kidney failure Mother   . Diabetes Mother   . Aortic aneurysm Father   . Coronary artery disease Father   . Heart attack Father   . Diabetes Sister   . Diabetes Brother   . Kidney  failure Brother     DRUG ALLERGIES:  Allergies  Allergen Reactions  . Ace Inhibitors Other (See Comments)    Reaction:  Unknown   . Amlodipine Other (See Comments)    Reaction:  Unknown   . Clonidine Hydrochloride Other (See Comments)    Reaction:  Unknown   . Codeine Hives  . Metformin Nausea And Vomiting    Review of Systems  Constitutional: Positive for malaise/fatigue. Negative for fever, chills and weight loss.  HENT: Negative for congestion.   Eyes: Negative for blurred vision and double vision.  Respiratory: Positive for cough, shortness of breath and wheezing. Negative for sputum production.   Cardiovascular: Positive for chest pain and leg swelling. Negative for palpitations, orthopnea and PND.  Gastrointestinal: Positive for nausea. Negative for vomiting, abdominal pain, diarrhea, constipation, blood in stool and melena.  Genitourinary: Negative for dysuria, urgency, frequency and hematuria.  Musculoskeletal: Negative for falls.  Skin: Negative for rash.  Neurological: Positive for weakness. Negative for dizziness.  Psychiatric/Behavioral: Negative for depression and memory loss. The patient is not nervous/anxious.     MEDICATIONS AT HOME:  Prior to Admission medications   Medication Sig Start Date End Date Taking? Authorizing Provider  acetaminophen (TYLENOL) 500 MG tablet Take 1,000 mg by mouth every 6 (six) hours as needed for mild pain.   Yes Historical Provider, MD  albuterol (PROVENTIL HFA;VENTOLIN HFA) 108 (90 BASE) MCG/ACT inhaler Inhale 2 puffs into the lungs every 6 (six) hours as needed for wheezing or shortness of breath.   Yes Historical Provider, MD  aspirin EC 81 MG tablet Take 81 mg by mouth daily.   Yes Historical Provider, MD  carvedilol (COREG) 25 MG tablet Take 25 mg by mouth 2 (two) times daily.   Yes Historical Provider, MD  Cholecalciferol (VITAMIN D3) 5000 UNITS TABS Take 5,000 Units by mouth daily.   Yes Historical Provider, MD  clopidogrel  (PLAVIX) 75 MG tablet Take 1 tablet (75 mg total) by mouth daily. 02/03/15  Yes Theodoro Grist, MD  co-enzyme Q-10 30 MG capsule Take 30 mg by mouth daily.   Yes Historical Provider, MD  ferrous fumarate-iron polysaccharide complex (TANDEM) 162-115.2 MG CAPS capsule Take 1 capsule by mouth daily.   Yes Historical Provider, MD  FLUoxetine (PROZAC) 20 MG capsule Take 20 mg by mouth at bedtime.   Yes Historical Provider, MD  furosemide (LASIX) 40 MG tablet Take 40 mg by mouth 2 (two) times daily. Pt only takes on Tuesday, Thursday, Saturday, and Sunday.   Yes Larey Dresser, MD  hydrALAZINE (APRESOLINE) 25 MG tablet Take 25 mg by mouth 3 (three) times daily.   Yes Historical Provider, MD  insulin glargine (LANTUS) 100 UNIT/ML injection Inject 30 Units into the skin at bedtime.  Yes Historical Provider, MD  insulin lispro (HUMALOG) 100 UNIT/ML injection Inject into the skin 3 (three) times daily before meals. Pt uses as needed per sliding scale.   Yes Historical Provider, MD  isosorbide mononitrate (IMDUR) 30 MG 24 hr tablet Take 1 tablet (30 mg total) by mouth daily. 08/18/15  Yes Wellington Hampshire, MD  levothyroxine (SYNTHROID, LEVOTHROID) 75 MCG tablet Take 75 mcg by mouth daily.    Yes Historical Provider, MD  lidocaine-prilocaine (EMLA) cream Apply 1 application topically as needed (for pain).   Yes Historical Provider, MD  nystatin (MYCOSTATIN/NYSTOP) 100000 UNIT/GM POWD Apply 1 g topically as needed (for irritation).    Yes Historical Provider, MD  rosuvastatin (CRESTOR) 40 MG tablet Take 40 mg by mouth at bedtime.   Yes Historical Provider, MD  telmisartan (MICARDIS) 80 MG tablet Take 80 mg by mouth daily.   Yes Historical Provider, MD  traZODone (DESYREL) 50 MG tablet Take 50 mg by mouth at bedtime.    Yes Historical Provider, MD  triamcinolone (KENALOG) 0.5 % cream Apply 1 application topically as needed (for irritation).    Yes Historical Provider, MD      PHYSICAL EXAMINATION:   VITAL  SIGNS: Blood pressure 151/54, pulse 62, temperature 98.3 F (36.8 C), temperature source Oral, resp. rate 22, height 5\' 2"  (1.575 m), weight 104.781 kg (231 lb), last menstrual period 02/01/1986, SpO2 94 %.  GENERAL:  79 y.o.-year-old patient lying in the bed with no acute distress.  EYES: Pupils equal, round, reactive to light and accommodation. No scleral icterus. Extraocular muscles intact.  HEENT: Head atraumatic, normocephalic. Oropharynx and nasopharynx clear.  NECK:  Supple, no jugular venous distention. No thyroid enlargement, no tenderness.  LUNGS: Normal breath sounds bilaterally, no wheezing, rales,rhonchi or crepitation. No use of accessory muscles of respiration.  CARDIOVASCULAR: S1, S2 normal. No murmurs, rubs, or gallops.  ABDOMEN: Soft, nontender, nondistended. Bowel sounds present. No organomegaly or mass.  EXTREMITIES:  plus lower extremity andal edema, , no cyanosis, or clubbing.  comfort on posterior calf palpation but-her, but no overt pain  NEUROLOGIC: Cranial nerves II through XII are intact. Muscle strength 5/5 in all extremities. Sensation intact. Gait not checked.  PSYCHIATRIC: The patient is alert and oriented x 3.  SKIN: No obvious rash, lesion, or ulcer.   LABORATORY PANEL:   CBC  Recent Labs Lab 08/19/15 1557  WBC 8.0  HGB 10.5*  HCT 31.2*  PLT 118*  MCV 99.2  MCH 33.5  MCHC 33.8  RDW 13.7  LYMPHSABS 1.3  MONOABS 0.6  EOSABS 0.7  BASOSABS 0.1   ------------------------------------------------------------------------------------------------------------------  Chemistries   Recent Labs Lab 08/19/15 1557  NA 133*  K 3.8  CL 98*  CO2 27  GLUCOSE 109*  BUN 30*  CREATININE 2.45*  CALCIUM 8.8*  AST 16  ALT 14  ALKPHOS 89  BILITOT 0.6   ------------------------------------------------------------------------------------------------------------------  Cardiac Enzymes  Recent Labs Lab 08/19/15 1557  TROPONINI <0.03    ------------------------------------------------------------------------------------------------------------------  RADIOLOGY: Dg Chest Portable 1 View  08/19/2015  CLINICAL DATA:  Shortness of breath and chest pain. Chronic renal disease EXAM: PORTABLE CHEST 1 VIEW COMPARISON:  September 12, 2014 FINDINGS: No edema or consolidation. Heart is mildly enlarged with pulmonary vascularity within normal limits. No adenopathy. Patient is status post internal mammary bypass grafting. No pneumothorax. IMPRESSION: No edema or consolidation. Electronically Signed   By: Lowella Grip III M.D.   On: 08/19/2015 16:18    EKG: Orders placed or performed during the  hospital encounter of 08/19/15  . ED EKG  . ED EKG    IMPRESSION AND PLAN:  Active Problems:   Chest pain 1. Chest pain. Admit patient to medical floor initiated her on aspirin, Coreg,  Plavix, Crestor , follow cardiac enzymes 3. Get cardiologist consult, and the Myoview stress test in the morning  2, essential hypertension , continue outpatient medications. Add nitroglycerin topically  3. End-stage renal disease , get nephrologist involved for further recommendations , continue outpatient therapy and diet  4. Diabetes mellitus type 2. Continue diabetic diet and sliding scale insulin    All the records are reviewed and case discussed with ED provider. Management plans discussed with the patient, family and they are in agreement.  CODE STATUS: Advance Directive Documentation        Most Recent Value   Type of Advance Directive  Healthcare Power of Attorney, Living will   Pre-existing out of facility DNR order (yellow form or pink MOST form)     "MOST" Form in Place?         TOTAL TIME TAKING CARE OF THIS PATIENT: 50 minutes .    Theodoro Grist M.D on 08/19/2015 at 5:35 PM  Between 7am to 6pm - Pager - [phone] After 6pm go to [email or web address] - password [email or web address] [facility]  Office   [phone]  CC: Primary care physician; Hortencia Pilar, MD

## 2015-08-19 NOTE — ED Provider Notes (Signed)
[facility] Emergency Department Provider Note   ____________________________________________  Time seen:  I have reviewed the triage vital signs and the triage nursing note.  HISTORY  Chief Complaint Chest Pain   Historian Patient and daughters  HPI Dawn Bradford is a 79 y.o. female with a history of end-stage renal disease, taking dialysis Monday, Wednesday, and Friday, and a history of bypass, and lung disease for which she wears 2 L most of the time, is here for evaluation of acute onset chest pain. This occurred when she was receiving dialysis. She had only received one hour today when she experienced central crushing chest pain with some mild shortness of breath. No nausea or extension of pain. She's had several episodes like this over the past couple weeks, and she saw her cardiologist who is Dr. Fletcher Anon yesterday who scheduled her for a stress test next week.  Chest pain was tonight 10/10 at the worst, is currently 4/10. No exacerbating or alleviating factors.   She had a stroke several months ago, and has been on Plavix and 325 aspirin since then. Yesterday her cardiologist decrease her aspirin to a dose of 81 mg.   Past Medical History  Diagnosis Date  . Hyperlipidemia   . Hypertension     resistant hyptertension times many years. the patient does have renal artery stenosis. she has tried calcium channel blockers in the past and states that she would not take them now because she had some problems with her gums which her dentist identified as calcium-channel blocker side effects.  . Diabetes mellitus     type 2  . Coronary artery disease     s/p anterior MI in 1996 followed by CABG. Lexiscan myoview (4/11): EF 67%, normal perfusion with no evidence for ischemia or infarction.   . Renal artery stenosis [facility])     The patient has an occluded right renal artery and an atrophic right kidney. There is 20% left renal artery stenosis. This was seen by  catheterization in 2007  . Diastolic heart failure     most recent echo (12/10) showed EF 55-60% with mild LVH, grade I diastolic dysfunction, mild LAE.  [name] Aortic valve disorder     Echo in 2009 showed mean aortic valve gradient of 13 mmHg, suggesting very mild stenosis. Echo (12/10) suggested aortic sclerosis only  . Obesity   . Chronic kidney disease     most recent creatinine was 1.2  . Obstructive sleep apnea     PSG 01/05/2006 AHI 24.8, CPAP 9cm H2O  . Mild asthma     PFT 02/05/10 FEV1 1.38, FEV1% 73, TLC 3.78 (86%), DLCO 48%, +BD  . ACE-inhibitor cough   . Hypothyroidism   . Obesity hypoventilation syndrome ([place])        . Pulmonary nodule, right   . Carotid artery disease (HCC)     mild, carotid dopplers 12/2009  . Myocardial infarction ([place])   . CHF (congestive heart failure) ([place])   . Renal insufficiency   . Stroke [facility])     Patient Active Problem List   Diagnosis Date Noted  . Coronary artery disease involving native heart with angina pectoris ([place]) 08/18/2015  . Expressive aphasia 02/03/2015  . Carotid stenosis 02/03/2015  . CVA (cerebral infarction) 01/31/2015  . Diabetes mellitus type 2 in obese ([place]) 01/31/2015  . End stage renal disease ([place]) 01/31/2015  . Mild intermittent asthma 03/29/2010  . CAD, ARTERY BYPASS GRAFT 03/02/2010  . Obesity hypoventilation syndrome ([place]) 02/05/2010  .  Shortness of breath 12/24/2009  . CAROTID BRUIT 12/14/2009  . DIASTOLIC HEART FAILURE, CHRONIC 02/17/2009  . DIABETES MELLITUS, TYPE II 01/29/2009  . Hyperlipidemia 01/29/2009  . SLEEP APNEA, OBSTRUCTIVE 01/29/2009  . Essential hypertension 01/29/2009  . Aortic valve disorder 01/29/2009  . RENAL ARTERY STENOSIS 01/29/2009  . RENAL DISEASE, CHRONIC 01/29/2009    Past Surgical History  Procedure Laterality Date  . Tubal ligation  1973  . Inner ear surgery  1979    right  . Abdominal hysterectomy  1985  . Coronary artery bypass graft  1996  . Cataract extraction  2010  . Av  fistula placement    . Dialysis fistula creation    . Peripheral vascular catheterization Left 01/06/2015    Procedure: A/V Shuntogram/Fistulagram;  Surgeon: Katha Cabal, MD;  Location: [place] CV LAB;  Service: Cardiovascular;  Laterality: Left;  . Peripheral vascular catheterization N/A 02/17/2015    Procedure: Dialysis/Perma Catheter Removal;  Surgeon: Katha Cabal, MD;  Location: [place] CV LAB;  Service: Cardiovascular;  Laterality: N/A;    Current Outpatient Rx  Name  Route  Sig  Dispense  Refill  . aspirin 325 MG tablet   Oral   Take 1 tablet (325 mg total) by mouth daily.   30 tablet   0   . carvedilol (COREG) 25 MG tablet      TAKE ONE TABLET BY MOUTH TWICE DAILY WITH MEALS   180 tablet   3   . Cholecalciferol (D 5000 PO)   Oral   Take 5,000 Int'l Units by mouth daily.          . clopidogrel (PLAVIX) 75 MG tablet   Oral   Take 1 tablet (75 mg total) by mouth daily.   30 tablet   2   . FLUoxetine (PROZAC) 10 MG capsule   Oral   Take 20 mg by mouth at bedtime.       3   . furosemide (LASIX) 40 MG tablet   Oral   Take 40 mg by mouth. Takes 1 tablet on Tuesday, Thursday, Saturday and Sundays.         [name] HUMALOG KWIKPEN 100 UNIT/ML SOPN   Subcutaneous   Inject 10 Units into the skin 2 (two) times daily before a meal. Use as directed as needed depending on blood sugar.         . Insulin Glargine (LANTUS) 100 UNIT/ML Solostar Pen   Subcutaneous   Inject 30 Units into the skin at bedtime.          . isosorbide mononitrate (IMDUR) 30 MG 24 hr tablet   Oral   Take 1 tablet (30 mg total) by mouth daily.   30 tablet   3   . levothyroxine (SYNTHROID, LEVOTHROID) 75 MCG tablet   Oral   Take 75 mcg by mouth daily before breakfast.         . lidocaine-prilocaine (EMLA) cream   Topical   Apply 1 application topically as needed (topical anesthesia for hemodialysis if Gebauers and Lidocaine injection are ineffective.).   30 g   0    . MICARDIS 80 MG tablet      TAKE ONE TABLET BY MOUTH EVERY DAY   30 each   5   . nystatin (MYCOSTATIN/NYSTOP) 100000 UNIT/GM POWD   Topical   Apply 100,000 g topically daily as needed (for rash and redness.).          [name] rosuvastatin (CRESTOR) 40 MG tablet  Oral   Take 40 mg by mouth at bedtime.         . traZODone (DESYREL) 50 MG tablet   Oral   Take 50 mg by mouth at bedtime.          . triamcinolone (KENALOG) 0.5 % cream   Topical   Apply 1 application topically daily as needed (for rash and redness.).            Allergies Ace inhibitors; Amlodipine; Amlodipine besylate; Clonidine hydrochloride; Codeine; and Metformin  Family History  Problem Relation Age of Onset  . Kidney failure Mother   . Diabetes Mother   . Aortic aneurysm Father   . Coronary artery disease Father   . Heart attack Father   . Diabetes Sister   . Diabetes Brother   . Kidney failure Brother     Social History Social History  Substance Use Topics  . Smoking status: Never Smoker   . Smokeless tobacco: Never Used  . Alcohol Use: No    Review of Systems  Constitutional: Negative for fever. Eyes: Negative for visual changes. ENT: Negative for sore throat. Cardiovascular: Negativfor palpitations Respiratory :  Negative for cough. Gastrointestinal: Negative for abdominal pain, vomiting and diarrhea. Genitourinary: Negative for dysuria. Musculoskeletal: Negative for back pain. Skin: Negative for rash. Neurological: Negative for headache. 10 point Review of Systems otherwise negative ____________________________________________   PHYSICAL EXAM:  VITAL SIGNS: ED Triage Vitals  Enc Vitals Group     BP 08/19/15 1602 160/61 mmHg     Pulse Rate 08/19/15 1553 64     Resp 08/19/15 1553 21     Temp 08/19/15 1553 98.3 F (36.8 C)     Temp Source 08/19/15 1553 Oral     SpO2 08/19/15 1553 96 %     Weight 08/19/15 1553 231 lb (104.781 kg)     Height 08/19/15 1553 5\' 2"  (1.575 m)      Head Cir --      Peak Flow --      Pain Score 08/19/15 1556 7     Pain Loc --      Pain Edu? --      Excl. in [place]? --      Constitutional: Alert and oriented. WellOverall but fairly anxious. Eyes : Conjunctivae are normal. PERRL. Normal extraocular movements. ENT   Head: Normocephalic and atraumatic.   Nose: No congestion/rhinnorhea.   Mouth/Throat: Mucous membranes are moist.   Neck: No stridor. Cardiovascular/Chest: Normal rate, regular rhythm.  No murmurs, rubs, or gallops. Respiratory: Normal respiratory effort without tachypnea nor retractions. Breath sounds are clear and equal bilaterally. No wheezes/rales/rhonchi. Gastrointestinal: Soft. No distention, no guarding, no rebound. Nontender . Obese  Genitourinary/rectal:Deferred Musculoskeletal: Nontender with normal range of motion in all extremities. No joint effusions.  No lower extremity tenderness.  No edema. Neurologic:  Normal speech and language. No gross or focal neurologic deficits are appreciated. Skin:  Skin is warm, dry and intact. No rash noted. Psychiatric: Mood and affect are normal. Speech and behavior are normal. Patient exhibits appropriate insight and judgment.  ____________________________________________   EKG I, Lisa Roca, MD, the attending physician have personally viewed and interpreted all EC64 bpm. Normal sinus rhythm. Narrow QRS. Normal axis. Normal ST and T-wave ___________________________________________  LABS (pertinent positives/negatives)  CBC shows a normal blood count 8.0 and hemoglobin of 10.5 and platelet count 180 Comprehensive metabolic panel significant for sodium 133, BUN 30 and creatinine 2.45 Troponin less than 0.03  ____________________________________________  RADIOLOGY All  Xrays were viewed by me. Imaging interpreted by Radiologist.   chest x-ray one view portable: No edema or consolidation  __________________________________________  PROCEDURES  Procedure(s) performed: None  Critical Care performed: None  ____________________________________________   ED COURSE / ASSESSMENT AND PLAN  CONSULTATIONS: Phone consultation with Dr. Holley Raring, nephrology.  Dr. Rockey Situ, cardiology.  Hospitalist, Dr. Judeen Hammans  Pertinent labs & imaging results that were available during my care of the patient were reviewed by me and considered in my medical decision making (see chart for details).   this patient has a history of coronary artery disease including bypass, and had an episode of severe central chest pain during dialysis. She has had several episodes of these recently, and sounds like is already scheduled for a stress test next week with Dr. Fletcher Anon.  Here the chest discomfort is easing off, and her EKG is reassuring. Her laboratory evaluation reassuring, however the clinical history of known prior MI with CABG and chest pain with dialysis is concerning for flow state ischemic lesion.  I discussed the case with nephrologist and cardiologist who recommend inpatient cardiac rule out and stress test.  Chest pain resolved.  Patient / Family / Caregiver informed of clinical course, medical decision-making process, and agree with plan.    ___________________________________________   FINAL CLINICAL IMPRESSION(S) / ED DIAGNOSES   Final diagnoses:  Chest pain, unspecified chest pain type       Lisa Roca, MD 08/19/15 1710

## 2015-08-19 NOTE — Progress Notes (Signed)
Spoke with patient & granddaughter.  Gave compassionate presence, support and prayer. [place] 1200

## 2015-08-20 ENCOUNTER — Observation Stay (HOSPITAL_BASED_OUTPATIENT_CLINIC_OR_DEPARTMENT_OTHER): Payer: Medicare Other

## 2015-08-20 ENCOUNTER — Encounter: Payer: Self-pay | Admitting: Radiology

## 2015-08-20 DIAGNOSIS — R079 Chest pain, unspecified: Secondary | ICD-10-CM

## 2015-08-20 DIAGNOSIS — I25110 Atherosclerotic heart disease of native coronary artery with unstable angina pectoris: Secondary | ICD-10-CM | POA: Diagnosis not present

## 2015-08-20 DIAGNOSIS — R0789 Other chest pain: Secondary | ICD-10-CM

## 2015-08-20 LAB — NM MYOCAR MULTI W/SPECT W/WALL MOTION / EF
CHL CUP NUCLEAR SDS: 0
CHL CUP NUCLEAR SSS: 0
CSEPHR: 55 %
CSEPPHR: 80 {beats}/min
LV sys vol: 26 mL
LVDIAVOL: 77 mL
Rest HR: 63 {beats}/min
SRS: 0
TID: 1.02

## 2015-08-20 LAB — BASIC METABOLIC PANEL
Anion gap: 4 — ABNORMAL LOW (ref 5–15)
BUN: 35 mg/dL — AB (ref 6–20)
CALCIUM: 8.3 mg/dL — AB (ref 8.9–10.3)
CHLORIDE: 99 mmol/L — AB (ref 101–111)
CO2: 27 mmol/L (ref 22–32)
CREATININE: 3.27 mg/dL — AB (ref 0.44–1.00)
GFR calc Af Amer: 15 mL/min — ABNORMAL LOW (ref >60)
GFR calc non Af Amer: 13 mL/min — ABNORMAL LOW (ref >60)
Glucose, Bld: 101 mg/dL — ABNORMAL HIGH (ref 65–99)
Potassium: 3.8 mmol/L (ref 3.5–5.1)
SODIUM: 130 mmol/L — AB (ref 135–145)

## 2015-08-20 LAB — GLUCOSE, CAPILLARY
GLUCOSE-CAPILLARY: 93 mg/dL (ref 65–99)
Glucose-Capillary: 125 mg/dL — ABNORMAL HIGH (ref 65–99)

## 2015-08-20 LAB — CBC
HEMATOCRIT: 30.4 % — AB (ref 35.0–47.0)
Hemoglobin: 10.3 g/dL — ABNORMAL LOW (ref 12.0–16.0)
MCH: 33.9 pg (ref 26.0–34.0)
MCHC: 33.9 g/dL (ref 32.0–36.0)
MCV: 99.8 fL (ref 80.0–100.0)
Platelets: 108 10*3/uL — ABNORMAL LOW (ref 150–440)
RBC: 3.04 MIL/uL — AB (ref 3.80–5.20)
RDW: 13.4 % (ref 11.5–14.5)
WBC: 6.7 10*3/uL (ref 3.6–11.0)

## 2015-08-20 LAB — HEMOGLOBIN A1C: HEMOGLOBIN A1C: 5.5 % (ref 4.0–6.0)

## 2015-08-20 LAB — TROPONIN I: Troponin I: <0.03 ng/mL (ref <0.031)

## 2015-08-20 LAB — PHOSPHORUS: Phosphorus: 4.1 mg/dL (ref 2.5–4.6)

## 2015-08-20 MED ORDER — HEPARIN SODIUM (PORCINE) 1000 UNIT/ML DIALYSIS
1000.0000 [IU] | INTRAMUSCULAR | Status: DC | PRN
  Filled 2015-08-20: qty 1

## 2015-08-20 MED ORDER — SODIUM CHLORIDE 0.9 % IV SOLN: 100.0000 mL | INTRAVENOUS | Status: DC | PRN

## 2015-08-20 MED ORDER — LIDOCAINE HCL (PF) 1 % IJ SOLN
5.0000 mL | INTRAMUSCULAR | Status: DC | PRN
  Filled 2015-08-20: qty 5

## 2015-08-20 MED ORDER — ISOSORBIDE MONONITRATE ER 60 MG PO TB24: 60.0000 mg | ORAL_TABLET | Freq: Every day | ORAL | Status: DC

## 2015-08-20 MED ORDER — TRAMADOL HCL 50 MG PO TABS: 50.0000 mg | ORAL_TABLET | Freq: Four times a day (QID) | ORAL | Status: DC | PRN

## 2015-08-20 MED ORDER — ALTEPLASE 2 MG IJ SOLR: 2.0000 mg | Freq: Once | INTRAMUSCULAR | Status: DC | PRN

## 2015-08-20 MED ORDER — TECHNETIUM TC 99M SESTAMIBI - CARDIOLITE
13.0000 | Freq: Once | INTRAVENOUS | Status: AC | PRN
  Administered 2015-08-20: 13.15 via INTRAVENOUS

## 2015-08-20 MED ORDER — REGADENOSON 0.4 MG/5ML IV SOLN
0.4000 mg | Freq: Once | INTRAVENOUS | Status: AC
  Administered 2015-08-20: 0.4 mg via INTRAVENOUS
  Filled 2015-08-20: qty 5

## 2015-08-20 MED ORDER — LIDOCAINE-PRILOCAINE 2.5-2.5 % EX CREA
1.0000 "application " | TOPICAL_CREAM | CUTANEOUS | Status: DC | PRN
  Filled 2015-08-20: qty 5

## 2015-08-20 MED ORDER — PENTAFLUOROPROP-TETRAFLUOROETH EX AERO
1.0000 "application " | INHALATION_SPRAY | CUTANEOUS | Status: DC | PRN
  Filled 2015-08-20: qty 30

## 2015-08-20 MED ORDER — TECHNETIUM TC 99M SESTAMIBI - CARDIOLITE
32.7300 | Freq: Once | INTRAVENOUS | Status: AC | PRN
  Administered 2015-08-20: 32.73 via INTRAVENOUS

## 2015-08-20 NOTE — Progress Notes (Signed)
Hd tx start  

## 2015-08-20 NOTE — Progress Notes (Signed)
[facility]  ROUNDING NOTE   Subjective:  Patient very well known to [place]. Developed chest pain 1.5 hours into dialysis yesterday. This was a relieved a bit once she came off of dialysis. She had stress test in 2015, but had one this AM as well.  Troponins x 3 were negative.    Objective:  Vital signs in last 24 hours:  Temp:  [97.6 F (36.4 C)-98.3 F (36.8 C)] 97.9 F (36.6 C) (12/15 0550) Pulse Rate:  [58-67] 67 (12/15 0550) Resp:  [17-28] 18 (12/15 0550) BP: (137-162)/(40-61) 158/52 mmHg (12/15 0550) SpO2:  [94 %-99 %] 96 % (12/15 0550) Weight:  [104.781 kg (231 lb)] 104.781 kg (231 lb) (12/14 1553)  Weight change:  Filed Weights   08/19/15 1553  Weight: 104.781 kg (231 lb)    Intake/Output: I/O last 3 completed shifts: In: -  Out: 450 [Urine:450]   Intake/Output this shift:     Physical Exam: General: NAD, resting comfortably.  Head: Normocephalic, atraumatic. Moist oral mucosal membranes  Eyes: Anicteric  Neck: Supple, trachea midline  Lungs:  Clear to auscultation, normal effort  Heart: Regular rate and rhythm  Abdomen:  Soft, nontender, BS present  Extremities: trace peripheral edema.  Neurologic: Nonfocal, moving all four extremities  Skin: No lesions  Access: Left UE AVF    Basic Metabolic Panel:  Recent Labs Lab 08/19/15 1557 08/20/15 0405  NA 133* 130*  K 3.8 3.8  CL 98* 99*  CO2 27 27  GLUCOSE 109* 101*  BUN 30* 35*  CREATININE 2.45* 3.27*  CALCIUM 8.8* 8.3*  PHOS  --  4.1    Liver Function Tests:  Recent Labs Lab 08/19/15 1557  AST 16  ALT 14  ALKPHOS 89  BILITOT 0.6  PROT 6.5  ALBUMIN 3.7   No results for input(s): LIPASE, AMYLASE in the last 168 hours. No results for input(s): AMMONIA in the last 168 hours.  CBC:  Recent Labs Lab 08/19/15 1557 08/20/15 0405  WBC 8.0 6.7  NEUTROABS 5.4  --   HGB 10.5* 10.3*  HCT 31.2* 30.4*  MCV 99.2 99.8  PLT 118* 108*    Cardiac Enzymes:  Recent Labs Lab  08/19/15 1557 08/19/15 1958 08/20/15 0405  TROPONINI <0.03 <0.03 <0.03    BNP: Invalid input(s): POCBNP  CBG:  Recent Labs Lab 08/19/15 2147  GLUCAP 149*    Microbiology: Results for orders placed or performed during the hospital encounter of 08/19/15  MRSA PCR Screening     Status: None   Collection Time: 08/19/15  8:15 PM  Result Value Ref Range Status   MRSA by PCR NEGATIVE NEGATIVE Final    Comment:        The GeneXpert MRSA Assay (FDA approved for NASAL specimens only), is one component of a comprehensive MRSA colonization surveillance program. It is not intended to diagnose MRSA infection nor to guide or monitor treatment for MRSA infections.     Coagulation Studies: No results for input(s): LABPROT, INR in the last 72 hours.  Urinalysis: No results for input(s): COLORURINE, LABSPEC, PHURINE, GLUCOSEU, HGBUR, BILIRUBINUR, KETONESUR, PROTEINUR, UROBILINOGEN, NITRITE, LEUKOCYTESUR in the last 72 hours.  Invalid input(s): APPERANCEUR    Imaging: Nm Myocar Multi W/spect W/wall Motion / Ef  08/20/2015   There was no ST segment deviation noted during stress.  Defect 1: There is a small defect of mild severity present in the apex location. This is likely due to breast attenuation.  The study is normal.  This  is a low risk study.  The left ventricular ejection fraction is normal (55-65%).    Dg Chest Portable 1 View  08/19/2015  CLINICAL DATA:  Shortness of breath and chest pain. Chronic renal disease EXAM: PORTABLE CHEST 1 VIEW COMPARISON:  September 12, 2014 FINDINGS: No edema or consolidation. Heart is mildly enlarged with pulmonary vascularity within normal limits. No adenopathy. Patient is status post internal mammary bypass grafting. No pneumothorax. IMPRESSION: No edema or consolidation. Electronically Signed   By: Lowella Grip III M.D.   On: 08/19/2015 16:18     Medications:     . aspirin EC  81 mg Oral Daily  . carvedilol  25 mg Oral BID  .  clopidogrel  75 mg Oral Daily  . FLUoxetine  20 mg Oral QHS  . furosemide  40 mg Oral 2 times per day on Sun Tue Thu Sat  . heparin  5,000 Units Subcutaneous 3 times per day  . hydrALAZINE  25 mg Oral TID  . insulin aspart  0-5 Units Subcutaneous QHS  . insulin aspart  0-9 Units Subcutaneous TID WC  . insulin aspart  3 Units Subcutaneous TID WC  . insulin glargine  30 Units Subcutaneous QHS  . irbesartan  300 mg Oral Daily  . isosorbide mononitrate  30 mg Oral Daily  . levothyroxine  75 mcg Oral QAC breakfast  . rosuvastatin  40 mg Oral QHS  . sodium chloride  3 mL Intravenous Q12H  . sodium chloride  3 mL Intravenous Q12H  . traZODone  50 mg Oral QHS   sodium chloride, sodium chloride, sodium chloride, acetaminophen **OR** acetaminophen, acetaminophen, albuterol, alteplase, heparin, lidocaine (PF), lidocaine-prilocaine, morphine injection, pentafluoroprop-tetrafluoroeth, sodium chloride  Assessment/ Plan:  79 y.o. female HTN, CAD, OSA, DM and ESRD started on hemodialysis Jan. 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  ESRD on HD:  Pt didn't complete HD yesterday, will plan for HD today, UF target 1kg today, will see if she develops chest pain with this.  2.  Anemia of CKD:  hgb currently 10.3, may resume epogen as an outpt, hold off for now.  3.  SHPTH:  Check ipth/phos with HD today.  4.  Hypertension:  Continue coreg, hydralazine, irbesartan.  5.  Chest pain with dialysis:  Stress test appears to be low risk.  However has hx of CAD with CABG.  Await further cardiology input.  Pt has been developing chest pain with HD.  Troponins also negative.      LOS:  Dawn Bradford 12/15/201611:36 AM

## 2015-08-20 NOTE — Progress Notes (Signed)
HD tx completed.

## 2015-08-20 NOTE — Progress Notes (Signed)
Pre-hd tx 

## 2015-08-20 NOTE — Consult Note (Signed)
Cardiology Consultation Note  Patient ID: Dawn Bradford, MRN: [id number], DOB/AGE: [birth year] 77 y.o. Admit date: 08/19/2015   Date of Consult: 08/20/2015 Primary Physician: Hortencia Pilar, MD Primary Cardiologist: Dr. Fletcher Anon, MD  Chief Complaint: Chest pain Reason for Consult: Unstable angina  HPI: 79 y.o. female with h/o CAD s/p CABG, ESRD on HD (M,W,F), chronic diastolic CHF, history of recent stroke in May 2016, resistant hypertension, renal artery stenosis, OHS/OSA, morbid obesity, DM2, anemia of chronic disease, and HLD who was recently seen in clinic on 08/18/2015 with episodes of substernal chest tightness radiating to her back occuring during dialysis. She was scheduled for outpatient nuclear stress testing along with adjustment of her antihypertensives. She was not having any exertional symptoms at that time. Echocardiogram in May 2016, showed normal LV systolic function, aortic sclerosis without significant stenosis and mild pulmonary hypertension. She most recently underwent nuclear stress testing in January 2015, that showed possible mild ischemia in the distal anterior wall versus shifting breast attenuation. Normal EF.    She presented to [facility] on 12/14 after being seen on 12/13 with continued complaints of chest pain after HD. Pain was worse after her most recent HD session and again radiated to her back. There were no associated symptoms. Pain felt different than her MI in 1996. Pain subsided after receiving morphine.    Upon the patient's arrival to [facility] they were found to have negative troponin x 3, sodium 133-->130. ECG showed NSR, 64 bpm, nonspecific inferolateral st/t changes, CXR showed no edema or consolidation.    Past Medical History  Diagnosis Date  . Hyperlipidemia   . Hypertension     resistant hyptertension times many years. the patient does have renal artery stenosis. she has tried calcium channel blockers in the past and states that she would not take them now  because she had some problems with her gums which her dentist identified as calcium-channel blocker side effects.  . Diabetes mellitus     type 2  . Coronary artery disease     s/p anterior MI in 1996 followed by CABG. Lexiscan myoview (4/11): EF 67%, normal perfusion with no evidence for ischemia or infarction.   . Renal artery stenosis [facility])     The patient has an occluded right renal artery and an atrophic right kidney. There is 20% left renal artery stenosis. This was seen by catheterization in 2007  . Diastolic heart failure     most recent echo (12/10) showed EF 55-60% with mild LVH, grade I diastolic dysfunction, mild LAE.  [name] Aortic valve disorder     Echo in 2009 showed mean aortic valve gradient of 13 mmHg, suggesting very mild stenosis. Echo (12/10) suggested aortic sclerosis only  . Obesity   . ESRD (end stage renal disease) on dialysis ([place])   . Obstructive sleep apnea     PSG 01/05/2006 AHI 24.8, CPAP 9cm H2O  . Mild asthma     PFT 02/05/10 FEV1 1.38, FEV1% 73, TLC 3.78 (86%), DLCO 48%, +BD  . ACE-inhibitor cough   . Hypothyroidism   . Obesity hypoventilation syndrome ([place])        . Pulmonary nodule, right   . Carotid artery disease (HCC)     mild, carotid dopplers 12/2009  . Myocardial infarction ([place])   . CHF (congestive heart failure) ([place])   . Stroke [facility])       Most Recent Cardiac Studies: Echo 02/01/2015: Study Conclusions  - Left ventricle: The cavity size was mildly dilated. Systolic  function was normal. The estimated ejection fraction was in the range of 55% to 60%. Wall motion was normal; there were no regional wall motion abnormalities. Left ventricular diastolic function parameters were normal. - Left atrium: The atrium was normal in size. - Right ventricle: Systolic function was normal. - Pulmonary arteries: Systolic pressure was mildly elevated. PA peak pressure: 43 mm Hg (S).  Impressions:  - No source of CVA or TIA noted.  Nuclear Stress  Test 09/2013: Small region of mild ischemia in the apical and anterior territory, Gi uptake artifact noted, no significant wall motion abnormality noted, EF 68%, no ECG changes concerning for ischemia, overall a moderate risk scan.    Surgical History:  Past Surgical History  Procedure Laterality Date  . Tubal ligation  1973  . Inner ear surgery  1979    right  . Abdominal hysterectomy  1985  . Coronary artery bypass graft  1996  . Cataract extraction  2010  . Av fistula placement    . Dialysis fistula creation    . Peripheral vascular catheterization Left 01/06/2015    Procedure: A/V Shuntogram/Fistulagram;  Surgeon: Katha Cabal, MD;  Location: [place] [facility];  Service: Cardiovascular;  Laterality: Left;  . Peripheral vascular catheterization N/A 02/17/2015    Procedure: Dialysis/Perma Catheter Removal;  Surgeon: Katha Cabal, MD;  Location: [place] [facility];  Service: Cardiovascular;  Laterality: N/A;     Home Meds: Prior to Admission medications   Medication Sig Start Date End Date Taking? Authorizing Provider  acetaminophen (TYLENOL) 500 MG tablet Take 1,000 mg by mouth every 6 (six) hours as needed for mild pain.   Yes Historical Provider, MD  albuterol (PROVENTIL HFA;VENTOLIN HFA) 108 (90 BASE) MCG/ACT inhaler Inhale 2 puffs into the lungs every 6 (six) hours as needed for wheezing or shortness of breath.   Yes Historical Provider, MD  aspirin EC 81 MG tablet Take 81 mg by mouth daily.   Yes Historical Provider, MD  carvedilol (COREG) 25 MG tablet Take 25 mg by mouth 2 (two) times daily.   Yes Historical Provider, MD  Cholecalciferol (VITAMIN D3) 5000 UNITS TABS Take 5,000 Units by mouth daily.   Yes Historical Provider, MD  clopidogrel (PLAVIX) 75 MG tablet Take 1 tablet (75 mg total) by mouth daily. 02/03/15  Yes Theodoro Grist, MD  co-enzyme Q-10 30 MG capsule Take 30 mg by mouth daily.   Yes Historical Provider, MD  ferrous fumarate-iron polysaccharide complex  (TANDEM) 162-115.2 MG CAPS capsule Take 1 capsule by mouth daily.   Yes Historical Provider, MD  FLUoxetine (PROZAC) 20 MG capsule Take 20 mg by mouth at bedtime.   Yes Historical Provider, MD  furosemide (LASIX) 40 MG tablet Take 40 mg by mouth 2 (two) times daily. Pt only takes on Tuesday, Thursday, Saturday, and Sunday.   Yes Larey Dresser, MD  hydrALAZINE (APRESOLINE) 25 MG tablet Take 25 mg by mouth 3 (three) times daily.   Yes Historical Provider, MD  insulin glargine (LANTUS) 100 UNIT/ML injection Inject 30 Units into the skin at bedtime.   Yes Historical Provider, MD  insulin lispro (HUMALOG) 100 UNIT/ML injection Inject into the skin 3 (three) times daily before meals. Pt uses as needed per sliding scale.   Yes Historical Provider, MD  isosorbide mononitrate (IMDUR) 30 MG 24 hr tablet Take 1 tablet (30 mg total) by mouth daily. 08/18/15  Yes Wellington Hampshire, MD  levothyroxine (SYNTHROID, LEVOTHROID) 75 MCG tablet Take 75 mcg  by mouth daily.    Yes Historical Provider, MD  lidocaine-prilocaine (EMLA) cream Apply 1 application topically as needed (for pain).   Yes Historical Provider, MD  nystatin (MYCOSTATIN/NYSTOP) 100000 UNIT/GM POWD Apply 1 g topically as needed (for irritation).    Yes Historical Provider, MD  rosuvastatin (CRESTOR) 40 MG tablet Take 40 mg by mouth at bedtime.   Yes Historical Provider, MD  telmisartan (MICARDIS) 80 MG tablet Take 80 mg by mouth daily.   Yes Historical Provider, MD  traZODone (DESYREL) 50 MG tablet Take 50 mg by mouth at bedtime.    Yes Historical Provider, MD  triamcinolone (KENALOG) 0.5 % cream Apply 1 application topically as needed (for irritation).    Yes Historical Provider, MD    Inpatient Medications:  . aspirin EC  81 mg Oral Daily  . carvedilol  25 mg Oral BID  . clopidogrel  75 mg Oral Daily  . FLUoxetine  20 mg Oral QHS  . furosemide  40 mg Oral 2 times per day on Sun Tue Thu Sat  . heparin  5,000 Units Subcutaneous 3 times per day    . hydrALAZINE  25 mg Oral TID  . insulin aspart  0-5 Units Subcutaneous QHS  . insulin aspart  0-9 Units Subcutaneous TID WC  . insulin aspart  3 Units Subcutaneous TID WC  . insulin glargine  30 Units Subcutaneous QHS  . irbesartan  300 mg Oral Daily  . isosorbide mononitrate  30 mg Oral Daily  . levothyroxine  75 mcg Oral QAC breakfast  . rosuvastatin  40 mg Oral QHS  . sodium chloride  3 mL Intravenous Q12H  . sodium chloride  3 mL Intravenous Q12H  . traZODone  50 mg Oral QHS      Allergies:  Allergies  Allergen Reactions  . Ace Inhibitors Other (See Comments)    Reaction:  Unknown   . Amlodipine Other (See Comments)    Reaction:  Unknown   . Clonidine Hydrochloride Other (See Comments)    Reaction:  Unknown   . Codeine Hives  . Metformin Nausea And Vomiting    Social History   Social History  . Marital Status: Married    Spouse Name: N/A  . Number of Children: N/A  . Years of Education: N/A   Occupational History  . Not on file.   Social History Main Topics  . Smoking status: Never Smoker   . Smokeless tobacco: Never Used  . Alcohol Use: No  . Drug Use: No  . Sexual Activity: Not on file   Other Topics Concern  . Not on file   Social History Narrative     Family History  Problem Relation Age of Onset  . Kidney failure Mother   . Diabetes Mother   . Aortic aneurysm Father   . Coronary artery disease Father   . Heart attack Father   . Diabetes Sister   . Diabetes Brother   . Kidney failure Brother      Review of Systems: Review of Systems  Constitutional: Positive for malaise/fatigue. Negative for fever, chills, weight loss and diaphoresis.  HENT: Negative for congestion.   Eyes: Negative for discharge and redness.  Respiratory: Positive for shortness of breath. Negative for cough, hemoptysis, sputum production and wheezing.   Cardiovascular: Positive for chest pain. Negative for palpitations, orthopnea, claudication, leg swelling and PND.   Gastrointestinal: Negative for nausea, vomiting and abdominal pain.  Musculoskeletal: Negative for falls.  Skin: Negative for rash.  Neurological: Positive for weakness. Negative for dizziness, sensory change, speech change, focal weakness and loss of consciousness.  Endo/Heme/Allergies: Does not bruise/bleed easily.  Psychiatric/Behavioral: Negative for substance abuse. The patient is not nervous/anxious.   All other systems reviewed and are negative.   Labs:  Recent Labs  08/19/15 1557 08/19/15 1958 08/20/15 0405  TROPONINI <0.03 <0.03 <0.03   Lab Results  Component Value Date   WBC 6.7 08/20/2015   HGB 10.3* 08/20/2015   HCT 30.4* 08/20/2015   MCV 99.8 08/20/2015   PLT 108* 08/20/2015     Recent Labs Lab 08/19/15 1557 08/20/15 0405  NA 133* 130*  K 3.8 3.8  CL 98* 99*  CO2 27 27  BUN 30* 35*  CREATININE 2.45* 3.27*  CALCIUM 8.8* 8.3*  PROT 6.5  --   BILITOT 0.6  --   ALKPHOS 89  --   ALT 14  --   AST 16  --   GLUCOSE 109* 101*   Lab Results  Component Value Date   CHOL 122 02/01/2015   HDL 43 02/01/2015   LDLCALC 65 02/01/2015   TRIG 68 02/01/2015   No results found for: DDIMER  Radiology/Studies:  Dg Chest Portable 1 View  08/19/2015  CLINICAL DATA:  Shortness of breath and chest pain. Chronic renal disease EXAM: PORTABLE CHEST 1 VIEW COMPARISON:  September 12, 2014 FINDINGS: No edema or consolidation. Heart is mildly enlarged with pulmonary vascularity within normal limits. No adenopathy. Patient is status post internal mammary bypass grafting. No pneumothorax. IMPRESSION: No edema or consolidation. Electronically Signed   By: Lowella Grip III M.D.   On: 08/19/2015 16:18    EKG: NSR, 64 bpm, nonspecific inferolateral st/t changes   Weights: Filed Weights   08/19/15 1553  Weight: 231 lb (104.781 kg)     Physical Exam: Blood pressure 158/52, pulse 67, temperature 97.9 F (36.6 C), temperature source Oral, resp. rate 18, height 5\' 2"  (1.575  m), weight 231 lb (104.781 kg), last menstrual period 02/01/1986, SpO2 96 %. Body mass index is 42.24 kg/(m^2). General: Well developed, well nourished, in no acute distress. Head: Normocephalic, atraumatic, sclera non-icteric, no xanthomas, nares are without discharge.  Neck: Negative for carotid bruits. JVD not elevated. Lungs: Clear bilaterally to auscultation without wheezes, rales, or rhonchi. Breathing is unlabored. Heart: RRR with S1 S2. No murmurs, rubs, or gallops appreciated. Abdomen: Obese, soft, non-tender, non-distended with normoactive bowel sounds. No hepatomegaly. No rebound/guarding. No obvious abdominal masses. Msk:  Strength and tone appear normal for age. Extremities: No clubbing or cyanosis. No edema.  Distal pedal pulses are 2+ and equal bilaterally. Neuro: Alert and oriented X 3. No facial asymmetry. No focal deficit. Moves all extremities spontaneously. Psych:  Responds to questions appropriately with a normal affect.    Assessment and Plan:   1. Unstable angina/CAD s/p CABG as above: -She is for [facility] this morning, ordered by IM (she is unable to treadmill successfully) -Further recommendations pending results of her nuclear stress tesst -If abnormal she would require cardiac catheterization, if unchanged from prior in January 2015, may look to address her HD regimen with Renal as she may be dropping her pressures during HD leading to an exacerbation in symptoms vs continued titration of medical management  -Continue aspirin 81 mg and Plavix 75 mg daily -Coreg 25 mg bid  2. Chronic diastolic CHF: -Volume managed through HD -Coreg 25 mg bid  3. Resistant HTN: -BP moderately controlled -Hydralazine was restarted by IM as an  inpatient given poorly controlled BP, will need to see what her nuc looks like and how her symptoms progress   4. HLD: -Crestor  5. Morbid obesity/OHS/OSA: -Wean O2 as able   Signed, Christell Faith, PA-C Pager: [phone] 08/20/2015, 8:01 AM

## 2015-08-20 NOTE — Progress Notes (Signed)
Pt to be discharged to home this evening. Iv and tele removed. disch instructions given to pt and spouse to their understanding. disch via w.c. Accompanied by husband.

## 2015-08-20 NOTE — Discharge Instructions (Signed)
°  DIET:  Renal diet  DISCHARGE CONDITION:  Stable  ACTIVITY:  Activity as tolerated  OXYGEN:  Home Oxygen: No.   Oxygen Delivery: room air  DISCHARGE LOCATION:  home   If you experience worsening of your admission symptoms, develop shortness of breath, life threatening emergency, suicidal or homicidal thoughts you must seek medical attention immediately by calling 911 or calling your MD immediately  if symptoms less severe.  You Must read complete instructions/literature along with all the possible adverse reactions/side effects for all the Medicines you take and that have been prescribed to you. Take any new Medicines after you have completely understood and accpet all the possible adverse reactions/side effects.   Please note  You were cared for by a hospitalist during your hospital stay. If you have any questions about your discharge medications or the care you received while you were in the hospital after you are discharged, you can call the unit and asked to speak with the hospitalist on call if the hospitalist that took care of you is not available. Once you are discharged, your primary care physician will handle any further medical issues. Please note that NO REFILLS for any discharge medications will be authorized once you are discharged, as it is imperative that you return to your primary care physician (or establish a relationship with a primary care physician if you do not have one) for your aftercare needs so that they can reassess your need for medications and monitor your lab values.

## 2015-08-20 NOTE — Progress Notes (Signed)
Post hd tx 

## 2015-08-20 NOTE — Care Management Obs Status (Signed)
[facility] NOTIFICATION   Patient Details  Name: Dawn Bradford MRN: [id number] Date of Birth: [birth year]   Medicare Observation Status Notification Given:  Yes    Katrina Stack, RN 08/20/2015, 5:11 PM

## 2015-08-21 LAB — HEPATITIS B SURFACE ANTIGEN: HEP B S AG: NEGATIVE

## 2015-08-21 LAB — PARATHYROID HORMONE, INTACT (NO CA): PTH: 58 pg/mL (ref 15–65)

## 2015-08-27 ENCOUNTER — Encounter: Admission: RE | Admit: 2015-08-27 | Payer: Medicare Other | Source: Ambulatory Visit

## 2015-09-02 NOTE — Discharge Summary (Signed)
[facility] NAME: Dawn Bradford    MR#:  [id number]  DATE OF BIRTH:  08-25-1936  DATE OF ADMISSION:  08/19/2015 ADMITTING PHYSICIAN: Theodoro Grist, MD  DATE OF DISCHARGE: 08/20/2015  6:13 PM  PRIMARY CARE PHYSICIAN: Hortencia Pilar, MD    ADMISSION DIAGNOSIS:  Chest pain [R07.9] Chest pain, unspecified chest pain type [R07.9]  DISCHARGE DIAGNOSIS:  Active Problems:   Chest pain   SECONDARY DIAGNOSIS:   Past Medical History  Diagnosis Date  . Hyperlipidemia   . Hypertension     resistant hyptertension times many years. the patient does have renal artery stenosis. she has tried calcium channel blockers in the past and states that she would not take them now because she had some problems with her gums which her dentist identified as calcium-channel blocker side effects.  . Diabetes mellitus     type 2  . Coronary artery disease     s/p anterior MI in 1996 followed by CABG. Lexiscan myoview (4/11): EF 67%, normal perfusion with no evidence for ischemia or infarction.   . Renal artery stenosis [facility])     The patient has an occluded right renal artery and an atrophic right kidney. There is 20% left renal artery stenosis. This was seen by catheterization in 2007  . Diastolic heart failure     most recent echo (12/10) showed EF 55-60% with mild LVH, grade I diastolic dysfunction, mild LAE.  [name] Aortic valve disorder     Echo in 2009 showed mean aortic valve gradient of 13 mmHg, suggesting very mild stenosis. Echo (12/10) suggested aortic sclerosis only  . Obesity   . ESRD (end stage renal disease) on dialysis ([place])   . Obstructive sleep apnea     PSG 01/05/2006 AHI 24.8, CPAP 9cm H2O  . Mild asthma     PFT 02/05/10 FEV1 1.38, FEV1% 73, TLC 3.78 (86%), DLCO 48%, +BD  . ACE-inhibitor cough   . Hypothyroidism   . Obesity hypoventilation syndrome ([place])        . Pulmonary nodule, right   . Carotid artery disease (HCC)     mild,  carotid dopplers 12/2009  . Myocardial infarction ([place])   . CHF (congestive heart failure) ([place])   . Stroke [facility])      ADMITTING HISTORY  HISTORY OF PRESENT ILLNESS: Dawn Bradford is a 79 y.o. female with a known history of coronary artery disease status post MI, status post coronary artery bypass grafting, end-stage renal disease, hemodialysis Mondays, Wednesdays, Fridays who presents to the hospital with complaints of chest pains with hemodialysis. Patient tells me that she's been having chest pains for the past one week, worsening on hemodialysis. Today she started having pain. It was very intense than on 10 by intensity and radiated to the back, it is getting progressively longer and more intense, accompanied by nausea as well as feeling presyncopal. On arrival to the hospital. Patient's EKG showed normal sinus rhythm with no acute ST-T changes, hospitalist services were contacted for admission,    HOSPITAL COURSE:   Admitted to [facility] floor. Troponins were normal. EKG showed no changes. Stress test reviewed and normal. Cardiology saw patient and added nitrates. Chest pain free at discharge. Had dialysis in hospital and tolerated well without hypotension.  Discharged home in stable condition.   CONSULTS OBTAINED:  Treatment Team:  Minna Merritts, MD Anthonette Legato, MD Wellington Hampshire, MD  DRUG ALLERGIES:   Allergies  Allergen Reactions  .  Ace Inhibitors Other (See Comments)    Reaction:  Unknown   . Amlodipine Other (See Comments)    Reaction:  Unknown   . Clonidine Hydrochloride Other (See Comments)    Reaction:  Unknown   . Codeine Hives  . Metformin Nausea And Vomiting    DISCHARGE MEDICATIONS:   Discharge Medication List as of 08/20/2015  5:22 PM    CONTINUE these medications which have CHANGED   Details  isosorbide mononitrate (IMDUR) 60 MG 24 hr tablet Take 1 tablet (60 mg total) by mouth daily., Starting 08/21/2015, Until Discontinued, Normal      CONTINUE  these medications which have NOT CHANGED   Details  acetaminophen (TYLENOL) 500 MG tablet Take 1,000 mg by mouth every 6 (six) hours as needed for mild pain., Until Discontinued, Historical Med    albuterol (PROVENTIL HFA;VENTOLIN HFA) 108 (90 BASE) MCG/ACT inhaler Inhale 2 puffs into the lungs every 6 (six) hours as needed for wheezing or shortness of breath., Until Discontinued, Historical Med    aspirin EC 81 MG tablet Take 81 mg by mouth daily., Until Discontinued, Historical Med    carvedilol (COREG) 25 MG tablet Take 25 mg by mouth 2 (two) times daily., Until Discontinued, Historical Med    Cholecalciferol (VITAMIN D3) 5000 UNITS TABS Take 5,000 Units by mouth daily., Until Discontinued, Historical Med    clopidogrel (PLAVIX) 75 MG tablet Take 1 tablet (75 mg total) by mouth daily., Starting 02/03/2015, Until Discontinued, Print    co-enzyme Q-10 30 MG capsule Take 30 mg by mouth daily., Until Discontinued, Historical Med    ferrous fumarate-iron polysaccharide complex (TANDEM) 162-115.2 MG CAPS capsule Take 1 capsule by mouth daily., Until Discontinued, Historical Med    FLUoxetine (PROZAC) 20 MG capsule Take 20 mg by mouth at bedtime., Until Discontinued, Historical Med    furosemide (LASIX) 40 MG tablet Take 40 mg by mouth 2 (two) times daily. Pt only takes on Tuesday, Thursday, Saturday, and Sunday., Until Discontinued, Historical Med    hydrALAZINE (APRESOLINE) 25 MG tablet Take 25 mg by mouth 3 (three) times daily., Until Discontinued, Historical Med    insulin glargine (LANTUS) 100 UNIT/ML injection Inject 30 Units into the skin at bedtime., Until Discontinued, Historical Med    insulin lispro (HUMALOG) 100 UNIT/ML injection Inject into the skin 3 (three) times daily before meals. Pt uses as needed per sliding scale., Until Discontinued, Historical Med    levothyroxine (SYNTHROID, LEVOTHROID) 75 MCG tablet Take 75 mcg by mouth daily. , Until Discontinued, Historical Med     lidocaine-prilocaine (EMLA) cream Apply 1 application topically as needed (for pain)., Until Discontinued, Historical Med    nystatin (MYCOSTATIN/NYSTOP) 100000 UNIT/GM POWD Apply 1 g topically as needed (for irritation). , Until Discontinued, Historical Med    rosuvastatin (CRESTOR) 40 MG tablet Take 40 mg by mouth at bedtime., Until Discontinued, Historical Med    telmisartan (MICARDIS) 80 MG tablet Take 80 mg by mouth daily., Until Discontinued, Historical Med    traZODone (DESYREL) 50 MG tablet Take 50 mg by mouth at bedtime. , Until Discontinued, Historical Med    triamcinolone (KENALOG) 0.5 % cream Apply 1 application topically as needed (for irritation). , Until Discontinued, Historical Med         Today    VITAL SIGNS:  Blood pressure 129/64, pulse 66, temperature 97.8 F (36.6 C), temperature source Oral, resp. rate 18, height 5\' 2"  (1.575 m), weight 99.4 kg (219 lb 2.2 oz), last menstrual period 02/01/1986,  SpO2 96 %.  I/O:  No intake or output data in the 24 hours ending 09/02/15 1655  PHYSICAL EXAMINATION:  Physical Exam  GENERAL:  79 y.o.-year-old patient lying in the bed with no acute distress.  LUNGS: Normal breath sounds bilaterally, no wheezing, rales,rhonchi or crepitation. No use of accessory muscles of respiration.  CARDIOVASCULAR: S1, S2 normal. No murmurs, rubs, or gallops.  ABDOMEN: Soft, non-tender, non-distended. Bowel sounds present. No organomegaly or mass.  NEUROLOGIC: Moves all 4 extremities. PSYCHIATRIC: The patient is alert and oriented x 3.  SKIN: No obvious rash, lesion, or ulcer.   DATA REVIEW:   CBC No results for input(s): WBC, HGB, HCT, PLT in the last 168 hours.  Chemistries  No results for input(s): NA, K, CL, CO2, GLUCOSE, BUN, CREATININE, CALCIUM, MG, AST, ALT, ALKPHOS, BILITOT in the last 168 hours.  Invalid input(s): GFRCGP  Cardiac Enzymes No results for input(s): TROPONINI in the last 168 hours.  Microbiology Results   Results for orders placed or performed during the hospital encounter of 08/19/15  MRSA PCR Screening     Status: None   Collection Time: 08/19/15  8:15 PM  Result Value Ref Range Status   MRSA by PCR NEGATIVE NEGATIVE Final    Comment:        The GeneXpert MRSA Assay (FDA approved for NASAL specimens only), is one component of a comprehensive MRSA colonization surveillance program. It is not intended to diagnose MRSA infection nor to guide or monitor treatment for MRSA infections.     RADIOLOGY:  No results found.    Follow up with PCP in 1 week.  Management plans discussed with the patient, family and they are in agreement.  CODE STATUS:  Advance Directive Documentation        Most Recent Value   Type of Advance Directive  Healthcare Power of Attorney, Living will   Pre-existing out of facility DNR order (yellow form or pink MOST form)     "MOST" Form in Place?        TOTAL TIME TAKING CARE OF THIS PATIENT ON DAY OF DISCHARGE: more than 30 minutes.    Hillary Bow R M.D on 09/02/2015 at 4:55 PM  Between 7am to 6pm - Pager - [phone]  After 6pm go to [email or web address] - password [email or web address] [facility]  Office  [phone]  CC: Primary care physician; Hortencia Pilar, MD     Note: This dictation was prepared with Dragon dictation along with smaller phrase technology. Any transcriptional errors that result from this process are unintentional.

## 2015-09-17 ENCOUNTER — Ambulatory Visit (INDEPENDENT_AMBULATORY_CARE_PROVIDER_SITE_OTHER): Payer: Medicare Other | Admitting: Cardiovascular Disease

## 2015-09-17 ENCOUNTER — Encounter: Payer: Self-pay | Admitting: Cardiovascular Disease

## 2015-09-17 VITALS — BP 160/70 | HR 64 | Ht 62.0 in | Wt 225.1 lb

## 2015-09-17 DIAGNOSIS — I6523 Occlusion and stenosis of bilateral carotid arteries: Secondary | ICD-10-CM | POA: Diagnosis not present

## 2015-09-17 DIAGNOSIS — R079 Chest pain, unspecified: Secondary | ICD-10-CM

## 2015-09-17 DIAGNOSIS — I25119 Atherosclerotic heart disease of native coronary artery with unspecified angina pectoris: Secondary | ICD-10-CM | POA: Diagnosis not present

## 2015-09-17 DIAGNOSIS — E785 Hyperlipidemia, unspecified: Secondary | ICD-10-CM

## 2015-09-17 DIAGNOSIS — I10 Essential (primary) hypertension: Secondary | ICD-10-CM

## 2015-09-17 MED ORDER — ISOSORBIDE MONONITRATE ER 60 MG PO TB24: 60.0000 mg | ORAL_TABLET | Freq: Every day | ORAL | Status: DC

## 2015-09-17 NOTE — Assessment & Plan Note (Signed)
Blood pressure is elevated but it tends to drop during dialysis. Thus, I made no changes in her medication.

## 2015-09-17 NOTE — Progress Notes (Signed)
HPI  This is a 80 year old with history of coronary artery disease status post CABG (in 66 in [place]), resistant hypertension, renal artery stenosis, morbid obesity, diastolic congestive heart failure, end stage renal disease on hemodialysis, and OHS/OSA who presents to Cardiology Clinic for followup. Echocardiogram in May, 2016  showed normal LV systolic function, aortic sclerosis without significant stenosis and mild pulmonary hypertension.  She was hospitalized in May of 2016 for stroke and was placed on Plavix.  She was seen recently for recurrent episodes of substernal chest pain during dialysis and was hospitalized recently for this reason. Troponin was negative. She underwent a nuclear stress test which showed no evidence of ischemia with normal ejection fraction. There was a small fixed apical defect suggestive of breast attenuation. She reports overall improvement in chest pain. She seems to have 2 components of chest pain. One kind as aching radiating to her back and seems to be positional and quite severe at times. She describes milder substernal chest tightness during dialysis sessions.  Allergies  Allergen Reactions  . Ace Inhibitors Other (See Comments)    Reaction:  Unknown   . Amlodipine Other (See Comments)    Reaction:  Unknown   . Clonidine Hydrochloride Other (See Comments)    Reaction:  Unknown   . Codeine Hives  . Metformin Nausea And Vomiting     Current Outpatient Prescriptions on File Prior to Visit  Medication Sig Dispense Refill  . acetaminophen (TYLENOL) 500 MG tablet Take 1,000 mg by mouth every 6 (six) hours as needed for mild pain.    [name] albuterol (PROVENTIL HFA;VENTOLIN HFA) 108 (90 BASE) MCG/ACT inhaler Inhale 2 puffs into the lungs every 6 (six) hours as needed for wheezing or shortness of breath.    [name] aspirin EC 81 MG tablet Take 81 mg by mouth daily.    . carvedilol (COREG) 25 MG tablet Take 25 mg by mouth 2 (two) times daily.    .  Cholecalciferol (VITAMIN D3) 5000 UNITS TABS Take 5,000 Units by mouth daily.    . clopidogrel (PLAVIX) 75 MG tablet Take 1 tablet (75 mg total) by mouth daily. 30 tablet 2  . ferrous fumarate-iron polysaccharide complex (TANDEM) 162-115.2 MG CAPS capsule Take 1 capsule by mouth daily.    [name] FLUoxetine (PROZAC) 20 MG capsule Take 20 mg by mouth at bedtime.    . furosemide (LASIX) 40 MG tablet Take 40 mg by mouth 2 (two) times daily. Pt only takes on Tuesday, Thursday, Saturday, and Sunday.    . hydrALAZINE (APRESOLINE) 25 MG tablet Take 25 mg by mouth 3 (three) times daily.    . insulin glargine (LANTUS) 100 UNIT/ML injection Inject 30 Units into the skin at bedtime.    . insulin lispro (HUMALOG) 100 UNIT/ML injection Inject into the skin 3 (three) times daily before meals. Pt uses as needed per sliding scale.    . levothyroxine (SYNTHROID, LEVOTHROID) 75 MCG tablet Take 75 mcg by mouth daily.     [name] lidocaine-prilocaine (EMLA) cream Apply 1 application topically as needed (for pain).    . nystatin (MYCOSTATIN/NYSTOP) 100000 UNIT/GM POWD Apply 1 g topically as needed (for irritation).     . rosuvastatin (CRESTOR) 40 MG tablet Take 40 mg by mouth at bedtime.    [name] telmisartan (MICARDIS) 80 MG tablet Take 80 mg by mouth daily.    . traZODone (DESYREL) 50 MG tablet Take 50 mg by mouth at bedtime.     . triamcinolone (KENALOG) 0.5 %  cream Apply 1 application topically as needed (for irritation).      No current facility-administered medications on file prior to visit.     Past Medical History  Diagnosis Date  . Hyperlipidemia   . Hypertension     resistant hyptertension times many years. the patient does have renal artery stenosis. she has tried calcium channel blockers in the past and states that she would not take them now because she had some problems with her gums which her dentist identified as calcium-channel blocker side effects.  . Diabetes mellitus     type 2  . Coronary artery disease       s/p anterior MI in 1996 followed by CABG. Lexiscan myoview (4/11): EF 67%, normal perfusion with no evidence for ischemia or infarction.   . Renal artery stenosis [facility])     The patient has an occluded right renal artery and an atrophic right kidney. There is 20% left renal artery stenosis. This was seen by catheterization in 2007  . Diastolic heart failure     most recent echo (12/10) showed EF 55-60% with mild LVH, grade I diastolic dysfunction, mild LAE.  [name] Aortic valve disorder     Echo in 2009 showed mean aortic valve gradient of 13 mmHg, suggesting very mild stenosis. Echo (12/10) suggested aortic sclerosis only  . Obesity   . ESRD (end stage renal disease) on dialysis ([place])   . Obstructive sleep apnea     PSG 01/05/2006 AHI 24.8, CPAP 9cm H2O  . Mild asthma     PFT 02/05/10 FEV1 1.38, FEV1% 73, TLC 3.78 (86%), DLCO 48%, +BD  . ACE-inhibitor cough   . Hypothyroidism   . Obesity hypoventilation syndrome ([place])        . Pulmonary nodule, right   . Carotid artery disease (HCC)     mild, carotid dopplers 12/2009  . Myocardial infarction ([place])   . CHF (congestive heart failure) ([place])   . Stroke [facility])      Past Surgical History  Procedure Laterality Date  . Tubal ligation  1973  . Inner ear surgery  1979    right  . Abdominal hysterectomy  1985  . Coronary artery bypass graft  1996  . Cataract extraction  2010  . Av fistula placement    . Dialysis fistula creation    . Peripheral vascular catheterization Left 01/06/2015    Procedure: A/V Shuntogram/Fistulagram;  Surgeon: Katha Cabal, MD;  Location: [place] [facility];  Service: Cardiovascular;  Laterality: Left;  . Peripheral vascular catheterization N/A 02/17/2015    Procedure: Dialysis/Perma Catheter Removal;  Surgeon: Katha Cabal, MD;  Location: [place] [facility];  Service: Cardiovascular;  Laterality: N/A;     Family History  Problem Relation Age of Onset  . Kidney failure Mother   . Diabetes Mother   .  Aortic aneurysm Father   . Coronary artery disease Father   . Heart attack Father   . Diabetes Sister   . Diabetes Brother   . Kidney failure Brother      Social History   Social History  . Marital Status: Married    Spouse Name: N/A  . Number of Children: N/A  . Years of Education: N/A   Occupational History  . Not on file.   Social History Main Topics  . Smoking status: Never Smoker   . Smokeless tobacco: Never Used  . Alcohol Use: No  . Drug Use: No  . Sexual Activity: Not on file  Other Topics Concern  . Not on file   Social History Narrative      PHYSICAL EXAM   BP 160/70 mmHg  Pulse 64  Ht 5\' 2"  (1.575 m)  Wt 225 lb 2 oz (102.116 kg)  BMI 41.17 kg/m2  LMP 02/01/1986 (Approximate) Constitutional: She is oriented to person, place, and time. She appears well-developed and well-nourished. No distress.  HENT: No nasal discharge.  Head: Normocephalic and atraumatic.  Eyes: Pupils are equal and round. Right eye exhibits no discharge. Left eye exhibits no discharge.  Neck: Normal range of motion. Neck supple. No JVD present. No thyromegaly present.  Cardiovascular: Normal rate, regular rhythm, normal heart sounds. Exam reveals no gallop and no friction rub. There is a 2/6 systolic ejection murmur at the aortic area which is early peaking with preserved S2.  Pulmonary/Chest: Effort normal and breath sounds normal. No stridor. No respiratory distress. She has no wheezes. She has no rales. She exhibits no tenderness.  Abdominal: Soft. Bowel sounds are normal. She exhibits no distension. There is no tenderness. There is no rebound and no guarding.  Musculoskeletal: Normal range of motion. She exhibits +1 edema and no tenderness.  Neurological: She is alert and oriented to person, place, and time. Coordination normal.  Skin: Skin is warm and dry. No rash noted. She is not diaphoretic. No erythema. No pallor.  Psychiatric: She has a normal mood and affect. Her behavior  is normal. Judgment and thought content normal.     EKG: Normal sinus rhythm with minimal LVH and nonspecific ST and T wave changes.   ASSESSMENT AND PLAN

## 2015-09-17 NOTE — Patient Instructions (Signed)
Medication Instructions: Continue same medications.   Labwork: None.   Procedures/Testing: None.   Follow-Up: 3 months follow up with Dr. Fletcher Anon.   Any Additional Special Instructions Will Be Listed Below (If Applicable).

## 2015-09-17 NOTE — Assessment & Plan Note (Signed)
The patient seems to have 2 kinds of chest pain 1 kind of clearly musculoskeletal in that she describes mild angina during dialysis sessions. Recent nuclear stress test was overall low risk. Obviously, she is 21 years post CABG and at risk for graft failure. She is currently on optimal medical therapy and dual antiplatelet therapy. I did explain to her and her daughter that if her symptoms worsen, then the next step is to proceed with cardiac catheterization. However, she is actually feeling better this week and would hold off for now.

## 2015-09-17 NOTE — Assessment & Plan Note (Signed)
Lab Results  Component Value Date   CHOL 122 02/01/2015   HDL 43 02/01/2015   LDLCALC 65 02/01/2015   TRIG 68 02/01/2015   CHOLHDL 2.8 02/01/2015   Continue high-dose rosuvastatin. LDL was at target.

## 2015-09-17 NOTE — Assessment & Plan Note (Signed)
This was moderate by carotid Doppler in May 2016. Recommend repeat carotid Doppler in May 2017.

## 2015-10-06 ENCOUNTER — Ambulatory Visit: Payer: Medicare Other | Admitting: Cardiovascular Disease

## 2015-12-15 ENCOUNTER — Ambulatory Visit (INDEPENDENT_AMBULATORY_CARE_PROVIDER_SITE_OTHER): Payer: Medicare Other | Admitting: Cardiovascular Disease

## 2015-12-15 ENCOUNTER — Ambulatory Visit: Payer: Medicare Other | Admitting: Cardiovascular Disease

## 2015-12-15 ENCOUNTER — Encounter: Payer: Self-pay | Admitting: Cardiovascular Disease

## 2015-12-15 VITALS — BP 140/76 | HR 68 | Ht 60.0 in | Wt 220.8 lb

## 2015-12-15 DIAGNOSIS — I6523 Occlusion and stenosis of bilateral carotid arteries: Secondary | ICD-10-CM

## 2015-12-15 NOTE — Progress Notes (Signed)
Cardiology Office Note   Date:  12/15/2015   ID:  Dawn Bradford, DOB 24-Jul-1936, MRN [id number]  PCP:  Hortencia Pilar, MD  Cardiologist:   Kathlyn Sacramento, MD   Chief Complaint  Patient presents with  . Follow-up    SOB & weakness      History of Present Illness: Dawn Bradford is a 80 y.o. female who presents for A follow-up visit regarding coronary artery disease. She has known history of coronary artery disease status post CABG (in 30 in [place]), resistant hypertension, renal artery stenosis, morbid obesity, diastolic congestive heart failure, end stage renal disease on hemodialysis, and OHS/OSA . Echocardiogram in May, 2016  showed normal LV systolic function, aortic sclerosis without significant stenosis and mild pulmonary hypertension.  She was hospitalized in May of 2016 for stroke and was placed on Plavix.  Nuclear stress test in December 2016 showed no evidence of ischemia with normal ejection fraction. There was a small fixed apical defect suggestive of breast attenuation. The test was done for chest pain happening during dialysis. She was treated medically and reports complete resolution of chest pain. She is doing well overall with stable dyspnea. Blood pressure has been reasonably controlled.  Past Medical History  Diagnosis Date  . Hyperlipidemia   . Hypertension     resistant hyptertension times many years. the patient does have renal artery stenosis. she has tried calcium channel blockers in the past and states that she would not take them now because she had some problems with her gums which her dentist identified as calcium-channel blocker side effects.  . Diabetes mellitus     type 2  . Coronary artery disease     s/p anterior MI in 1996 followed by CABG. Lexiscan myoview (4/11): EF 67%, normal perfusion with no evidence for ischemia or infarction.   . Renal artery stenosis [facility])     The patient has an occluded right renal artery and an atrophic right kidney.  There is 20% left renal artery stenosis. This was seen by catheterization in 2007  . Diastolic heart failure     most recent echo (12/10) showed EF 55-60% with mild LVH, grade I diastolic dysfunction, mild LAE.  [name] Aortic valve disorder     Echo in 2009 showed mean aortic valve gradient of 13 mmHg, suggesting very mild stenosis. Echo (12/10) suggested aortic sclerosis only  . Obesity   . ESRD (end stage renal disease) on dialysis ([place])   . Obstructive sleep apnea     PSG 01/05/2006 AHI 24.8, CPAP 9cm H2O  . Mild asthma     PFT 02/05/10 FEV1 1.38, FEV1% 73, TLC 3.78 (86%), DLCO 48%, +BD  . ACE-inhibitor cough   . Hypothyroidism   . Obesity hypoventilation syndrome ([place])        . Pulmonary nodule, right   . Carotid artery disease (HCC)     mild, carotid dopplers 12/2009  . Myocardial infarction ([place])   . CHF (congestive heart failure) ([place])   . Stroke [facility])     Past Surgical History  Procedure Laterality Date  . Tubal ligation  1973  . Inner ear surgery  1979    right  . Abdominal hysterectomy  1985  . Coronary artery bypass graft  1996  . Cataract extraction  2010  . Av fistula placement    . Dialysis fistula creation    . Peripheral vascular catheterization Left 01/06/2015    Procedure: A/V Shuntogram/Fistulagram;  Surgeon: Katha Cabal, MD;  Location: [place] [facility];  Service: Cardiovascular;  Laterality: Left;  . Peripheral vascular catheterization N/A 02/17/2015    Procedure: Dialysis/Perma Catheter Removal;  Surgeon: Katha Cabal, MD;  Location: [place] [facility];  Service: Cardiovascular;  Laterality: N/A;     Current Outpatient Prescriptions  Medication Sig Dispense Refill  . acetaminophen (TYLENOL) 500 MG tablet Take 1,000 mg by mouth every 6 (six) hours as needed for mild pain.    [name] albuterol (PROVENTIL HFA;VENTOLIN HFA) 108 (90 BASE) MCG/ACT inhaler Inhale 2 puffs into the lungs every 6 (six) hours as needed for wheezing or shortness of breath.    [name]  aspirin EC 81 MG tablet Take 81 mg by mouth daily.    . carvedilol (COREG) 25 MG tablet Take 25 mg by mouth 2 (two) times daily.    . Cholecalciferol (VITAMIN D3) 5000 UNITS TABS Take 5,000 Units by mouth daily.    . clopidogrel (PLAVIX) 75 MG tablet Take 1 tablet (75 mg total) by mouth daily. 30 tablet 2  . ferrous fumarate-iron polysaccharide complex (TANDEM) 162-115.2 MG CAPS capsule Take 1 capsule by mouth daily.    [name] FLUoxetine (PROZAC) 20 MG capsule Take 20 mg by mouth at bedtime.    . furosemide (LASIX) 40 MG tablet Take 40 mg by mouth 2 (two) times daily. Pt only takes on Tuesday, Thursday, Saturday, and Sunday.    . hydrALAZINE (APRESOLINE) 25 MG tablet Take 25 mg by mouth 3 (three) times daily.    . insulin glargine (LANTUS) 100 UNIT/ML injection Inject 30 Units into the skin at bedtime.    . insulin lispro (HUMALOG) 100 UNIT/ML injection Inject into the skin 3 (three) times daily before meals. Pt uses as needed per sliding scale.    . isosorbide mononitrate (IMDUR) 60 MG 24 hr tablet Take 1 tablet (60 mg total) by mouth daily. 30 tablet 6  . levothyroxine (SYNTHROID, LEVOTHROID) 75 MCG tablet Take 75 mcg by mouth daily.     [name] lidocaine-prilocaine (EMLA) cream Apply 1 application topically as needed (for pain).    . nystatin (MYCOSTATIN/NYSTOP) 100000 UNIT/GM POWD Apply 1 g topically as needed (for irritation).     . rosuvastatin (CRESTOR) 40 MG tablet Take 40 mg by mouth at bedtime.    [name] telmisartan (MICARDIS) 80 MG tablet Take 80 mg by mouth daily.    . traZODone (DESYREL) 50 MG tablet Take 50 mg by mouth at bedtime.     . triamcinolone (KENALOG) 0.5 % cream Apply 1 application topically as needed (for irritation).      No current facility-administered medications for this visit.    Allergies:   Ace inhibitors; Amlodipine; Clonidine hydrochloride; Codeine; and Metformin    Social History:  The patient  reports that she has never smoked. She has never used smokeless tobacco. She  reports that she does not drink alcohol or use illicit drugs.   Family History:  The patient's family history includes Aortic aneurysm in her father; Coronary artery disease in her father; Diabetes in her brother, mother, and sister; Heart attack in her father; Kidney failure in her brother and mother.    ROS:  Please see the history of present illness.   Otherwise, review of systems are positive for none.   All other systems are reviewed and negative.    PHYSICAL EXAM: VS:  BP 140/76 mmHg  Pulse 68  Ht 5' (1.524 m)  Wt 220 lb 12.8 oz (100.154 kg)  BMI 43.12 kg/m2  SpO2 90%  LMP 02/01/1986 (Approximate) , BMI Body mass index is 43.12 kg/(m^2). GEN: Well nourished, well developed, in no acute distress HEENT: normal Neck: no JVD,  or masses. Faint left carotid bruit Cardiac: RRR; no rubs, or gallops,no edema . There is a 2 out of 6 early peaking systolic murmur in the aortic area Respiratory:  clear to auscultation bilaterally, normal work of breathing GI: soft, nontender, nondistended, + BS MS: no deformity or atrophy Skin: warm and dry, no rash Neuro:  Strength and sensation are intact Psych: euthymic mood, full affect   EKG:  EKG is not ordered today.    Recent Labs: 08/19/2015: ALT 14 08/20/2015: BUN 35*; Creatinine, Ser 3.27*; Hemoglobin 10.3*; Platelets 108*; Potassium 3.8; Sodium 130*    Lipid Panel    Component Value Date/Time   CHOL 122 02/01/2015 0433   CHOL 105 09/12/2014 1353   CHOL 123 10/21/2011 0844   TRIG 68 02/01/2015 0433   TRIG 95 09/12/2014 1353   TRIG 95 12/08/2009   HDL 43 02/01/2015 0433   HDL 44 09/12/2014 1353   HDL 43 10/21/2011 0844   CHOLHDL 2.8 02/01/2015 0433   CHOLHDL 2.9 10/21/2011 0844   VLDL 14 02/01/2015 0433   VLDL 19 09/12/2014 1353   LDLCALC 65 02/01/2015 0433   LDLCALC 42 09/12/2014 1353   LDLCALC 61 10/21/2011 0844      Wt Readings from Last 3 Encounters:  12/15/15 220 lb 12.8 oz (100.154 kg)  09/17/15 225 lb 2 oz  (102.116 kg)  08/20/15 219 lb 2.2 oz (99.4 kg)       ASSESSMENT AND PLAN:  1.  Coronary artery disease involving native coronary arteries with stable angina: The patient's symptoms of chest pain during dialysis has resolved completely. Continue medical therapy.  2. Bilateral carotid stenosis: This was moderate on most recent carotid Doppler in May 2016. I requested a follow-up study next month. Continue treatment of risk factors. She is already on aspirin and Plavix.  3. Essential hypertension: Blood pressure is reasonably controlled on current medications.  4. Hyperlipidemia: Continue high-dose rosuvastatin. Target LDL is less than 70.     Disposition:   FU with me in 6 months  Signed,  Kathlyn Sacramento, MD  12/15/2015 3:16 PM    [place]

## 2015-12-15 NOTE — Patient Instructions (Addendum)
Medication Instructions:  Your physician recommends that you continue on your current medications as directed. Please refer to the Current Medication list given to you today.   Labwork: none  Testing/Procedures: Your physician has requested that you have a carotid duplex in May. This test is an ultrasound of the carotid arteries in your neck. It looks at blood flow through these arteries that supply the brain with blood. Allow one hour for this exam. There are no restrictions or special instructions.    Follow-Up: Your physician wants you to follow-up in: six months with Dr. Fletcher Anon.  You will receive a reminder letter in the mail two months in advance. If you don't receive a letter, please call our office to schedule the follow-up appointment.   Any Other Special Instructions Will Be Listed Below (If Applicable).     If you need a refill on your cardiac medications before your next appointment, please call your pharmacy.

## 2016-01-01 ENCOUNTER — Other Ambulatory Visit: Payer: Self-pay | Admitting: Cardiovascular Disease

## 2016-01-01 DIAGNOSIS — I6523 Occlusion and stenosis of bilateral carotid arteries: Secondary | ICD-10-CM

## 2016-01-01 DIAGNOSIS — R0989 Other specified symptoms and signs involving the circulatory and respiratory systems: Secondary | ICD-10-CM

## 2016-01-04 DIAGNOSIS — E113299 Type 2 diabetes mellitus with mild nonproliferative diabetic retinopathy without macular edema, unspecified eye: Secondary | ICD-10-CM | POA: Insufficient documentation

## 2016-01-05 ENCOUNTER — Ambulatory Visit: Payer: Medicare Other

## 2016-01-05 ENCOUNTER — Encounter (INDEPENDENT_AMBULATORY_CARE_PROVIDER_SITE_OTHER): Payer: Self-pay

## 2016-01-05 DIAGNOSIS — I6523 Occlusion and stenosis of bilateral carotid arteries: Secondary | ICD-10-CM | POA: Diagnosis not present

## 2016-01-05 DIAGNOSIS — R0989 Other specified symptoms and signs involving the circulatory and respiratory systems: Secondary | ICD-10-CM

## 2016-01-18 ENCOUNTER — Other Ambulatory Visit: Payer: Self-pay | Admitting: Vascular Surgery

## 2016-01-27 ENCOUNTER — Telehealth: Payer: Self-pay | Admitting: Pulmonary Disease

## 2016-01-27 NOTE — Telephone Encounter (Signed)
Spoke with pt's daughter. Reports she received call from [place] who advised pt needs an appt prior to 03/01/16 for Medicare to continue to pay for o2.  OV scheduled with TP for May 30.  Daughter confirmed appt and voiced no further questions or concerns at this time.

## 2016-01-28 ENCOUNTER — Encounter
Admission: RE | Disposition: A | Discharge status: Home / Self Care | Payer: Self-pay | Source: Ambulatory Visit | Attending: Vascular Surgery

## 2016-01-28 ENCOUNTER — Encounter: Payer: Self-pay | Admitting: *Deleted

## 2016-01-28 ENCOUNTER — Ambulatory Visit
Admission: RE | Admit: 2016-01-28 | Discharge: 2016-01-28 | Disposition: A | Discharge status: Home / Self Care | Payer: Medicare Other | Source: Ambulatory Visit | Attending: Vascular Surgery | Admitting: Vascular Surgery

## 2016-01-28 DIAGNOSIS — E669 Obesity, unspecified: Secondary | ICD-10-CM | POA: Insufficient documentation

## 2016-01-28 DIAGNOSIS — Y832 Surgical operation with anastomosis, bypass or graft as the cause of abnormal reaction of the patient, or of later complication, without mention of misadventure at the time of the procedure: Secondary | ICD-10-CM | POA: Insufficient documentation

## 2016-01-28 DIAGNOSIS — G4733 Obstructive sleep apnea (adult) (pediatric): Secondary | ICD-10-CM | POA: Insufficient documentation

## 2016-01-28 DIAGNOSIS — I358 Other nonrheumatic aortic valve disorders: Secondary | ICD-10-CM | POA: Insufficient documentation

## 2016-01-28 DIAGNOSIS — I132 Hypertensive heart and chronic kidney disease with heart failure and with stage 5 chronic kidney disease, or end stage renal disease: Secondary | ICD-10-CM | POA: Diagnosis not present

## 2016-01-28 DIAGNOSIS — Z90710 Acquired absence of both cervix and uterus: Secondary | ICD-10-CM | POA: Insufficient documentation

## 2016-01-28 DIAGNOSIS — Z8673 Personal history of transient ischemic attack (TIA), and cerebral infarction without residual deficits: Secondary | ICD-10-CM | POA: Insufficient documentation

## 2016-01-28 DIAGNOSIS — E039 Hypothyroidism, unspecified: Secondary | ICD-10-CM | POA: Insufficient documentation

## 2016-01-28 DIAGNOSIS — T82858A Stenosis of vascular prosthetic devices, implants and grafts, initial encounter: Secondary | ICD-10-CM | POA: Diagnosis not present

## 2016-01-28 DIAGNOSIS — Z9889 Other specified postprocedural states: Secondary | ICD-10-CM | POA: Insufficient documentation

## 2016-01-28 DIAGNOSIS — I5032 Chronic diastolic (congestive) heart failure: Secondary | ICD-10-CM | POA: Insufficient documentation

## 2016-01-28 DIAGNOSIS — J45909 Unspecified asthma, uncomplicated: Secondary | ICD-10-CM | POA: Diagnosis not present

## 2016-01-28 DIAGNOSIS — I252 Old myocardial infarction: Secondary | ICD-10-CM | POA: Diagnosis not present

## 2016-01-28 DIAGNOSIS — R911 Solitary pulmonary nodule: Secondary | ICD-10-CM | POA: Insufficient documentation

## 2016-01-28 DIAGNOSIS — Z951 Presence of aortocoronary bypass graft: Secondary | ICD-10-CM | POA: Insufficient documentation

## 2016-01-28 DIAGNOSIS — E1122 Type 2 diabetes mellitus with diabetic chronic kidney disease: Secondary | ICD-10-CM | POA: Diagnosis not present

## 2016-01-28 DIAGNOSIS — Z8249 Family history of ischemic heart disease and other diseases of the circulatory system: Secondary | ICD-10-CM | POA: Insufficient documentation

## 2016-01-28 DIAGNOSIS — Z833 Family history of diabetes mellitus: Secondary | ICD-10-CM | POA: Insufficient documentation

## 2016-01-28 DIAGNOSIS — Z841 Family history of disorders of kidney and ureter: Secondary | ICD-10-CM | POA: Diagnosis not present

## 2016-01-28 DIAGNOSIS — I2510 Atherosclerotic heart disease of native coronary artery without angina pectoris: Secondary | ICD-10-CM | POA: Diagnosis not present

## 2016-01-28 DIAGNOSIS — I110 Hypertensive heart disease with heart failure: Secondary | ICD-10-CM | POA: Diagnosis not present

## 2016-01-28 DIAGNOSIS — N186 End stage renal disease: Secondary | ICD-10-CM | POA: Diagnosis not present

## 2016-01-28 DIAGNOSIS — Z888 Allergy status to other drugs, medicaments and biological substances status: Secondary | ICD-10-CM | POA: Insufficient documentation

## 2016-01-28 DIAGNOSIS — Z992 Dependence on renal dialysis: Secondary | ICD-10-CM | POA: Diagnosis not present

## 2016-01-28 DIAGNOSIS — I701 Atherosclerosis of renal artery: Secondary | ICD-10-CM | POA: Diagnosis not present

## 2016-01-28 DIAGNOSIS — E785 Hyperlipidemia, unspecified: Secondary | ICD-10-CM | POA: Diagnosis not present

## 2016-01-28 DIAGNOSIS — Z885 Allergy status to narcotic agent status: Secondary | ICD-10-CM | POA: Insufficient documentation

## 2016-01-28 HISTORY — PX: PERIPHERAL VASCULAR CATHETERIZATION: SHX172C

## 2016-01-28 LAB — GLUCOSE, CAPILLARY: GLUCOSE-CAPILLARY: 91 mg/dL (ref 65–99)

## 2016-01-28 SURGERY — A/V SHUNTOGRAM/FISTULAGRAM
Anesthesia: Moderate Sedation

## 2016-01-28 MED ORDER — METHYLPREDNISOLONE SODIUM SUCC 125 MG IJ SOLR: 125.0000 mg | INTRAMUSCULAR | Status: DC | PRN

## 2016-01-28 MED ORDER — HEPARIN SODIUM (PORCINE) 1000 UNIT/ML IJ SOLN
INTRAMUSCULAR | Status: DC | PRN
Start: 2016-01-28 — End: 2016-01-28
  Administered 2016-01-28: 3000 [IU] via INTRAVENOUS

## 2016-01-28 MED ORDER — CEFUROXIME SODIUM 1.5 G IJ SOLR
1.5000 g | INTRAMUSCULAR | Status: AC
  Administered 2016-01-28: 1.5 g via INTRAVENOUS

## 2016-01-28 MED ORDER — LIDOCAINE-EPINEPHRINE (PF) 1 %-1:200000 IJ SOLN
INTRAMUSCULAR | Status: AC
Start: 2016-01-28 — End: 2016-01-28
  Filled 2016-01-28: qty 30

## 2016-01-28 MED ORDER — HEPARIN SODIUM (PORCINE) 1000 UNIT/ML IJ SOLN
INTRAMUSCULAR | Status: AC
  Filled 2016-01-28: qty 1

## 2016-01-28 MED ORDER — FENTANYL CITRATE (PF) 100 MCG/2ML IJ SOLN
INTRAMUSCULAR | Status: AC
Start: 2016-01-28 — End: 2016-01-28
  Filled 2016-01-28: qty 2

## 2016-01-28 MED ORDER — MIDAZOLAM HCL 5 MG/5ML IJ SOLN
INTRAMUSCULAR | Status: AC
  Filled 2016-01-28: qty 5

## 2016-01-28 MED ORDER — ONDANSETRON HCL 4 MG/2ML IJ SOLN: 4.0000 mg | Freq: Four times a day (QID) | INTRAMUSCULAR | Status: DC | PRN

## 2016-01-28 MED ORDER — FAMOTIDINE 20 MG PO TABS: 40.0000 mg | ORAL_TABLET | ORAL | Status: DC | PRN

## 2016-01-28 MED ORDER — FENTANYL CITRATE (PF) 100 MCG/2ML IJ SOLN
INTRAMUSCULAR | Status: DC | PRN
  Administered 2016-01-28: 50 ug via INTRAVENOUS

## 2016-01-28 MED ORDER — SODIUM CHLORIDE 0.9 % IV SOLN
INTRAVENOUS | Status: DC
  Administered 2016-01-28: 10:00:00 via INTRAVENOUS

## 2016-01-28 MED ORDER — MIDAZOLAM HCL 2 MG/2ML IJ SOLN
INTRAMUSCULAR | Status: DC | PRN
  Administered 2016-01-28: 2 mg via INTRAVENOUS

## 2016-01-28 MED ORDER — LIDOCAINE-EPINEPHRINE (PF) 1 %-1:200000 IJ SOLN
INTRAMUSCULAR | Status: DC | PRN
  Administered 2016-01-28: 10 mL via INTRADERMAL

## 2016-01-28 MED ORDER — IOPAMIDOL (ISOVUE-300) INJECTION 61%
INTRAVENOUS | Status: DC | PRN
  Administered 2016-01-28: 35 mL via INTRA_ARTERIAL

## 2016-01-28 MED ORDER — HEPARIN (PORCINE) IN NACL 2-0.9 UNIT/ML-% IJ SOLN
INTRAMUSCULAR | Status: AC
  Filled 2016-01-28: qty 1000

## 2016-01-28 MED ORDER — HYDROMORPHONE HCL 1 MG/ML IJ SOLN: 1.0000 mg | Freq: Once | INTRAMUSCULAR | Status: DC

## 2016-01-28 SURGICAL SUPPLY — 12 items
BALLN DORADO 8X60X80 (BALLOONS) ×3
BALLN LUTONIX DCB 4X40X130 (BALLOONS) ×3
BALLOON DORADO 8X60X80 (BALLOONS) ×1 IMPLANT
BALLOON LUTONIX DCB 4X40X130 (BALLOONS) ×1 IMPLANT
CANNULA 5F STIFF (CANNULA) ×3 IMPLANT
CATH TORCON 5FR 0.38 (CATHETERS) ×3 IMPLANT
DEVICE PRESTO INFLATION (MISCELLANEOUS) ×3 IMPLANT
DRAPE BRACHIAL (DRAPES) ×3 IMPLANT
PACK ANGIOGRAPHY (CUSTOM PROCEDURE TRAY) ×3 IMPLANT
SHEATH BRITE TIP 6FRX5.5 (SHEATH) ×3 IMPLANT
TOWEL OR 17X26 4PK STRL BLUE (TOWEL DISPOSABLE) ×3 IMPLANT
WIRE MAGIC TOR.035 180C (WIRE) ×3 IMPLANT

## 2016-01-28 NOTE — H&P (Signed)
[facility] Admission History & Physical  MRN : [id number]  Dawn Bradford is a 80 y.o. ([birth year]) female who presents with chief complaint of No chief complaint on file. [name]  History of Present Illness: Patient is a 80 year old female with end-stage renal disease. She has a left radiocephalic AV fistula which has been treated in the past and required stent placement last year. Over the past several weeks, they have had more and more difficulty getting access to the fistula and actually sent her home 3 times from dialysis. She has a significant bruise from an infiltration. Her arm is somewhat painful and swollen after the infiltration, but she has no fever or chills and no signs of systemic infection. She is brought in today for further evaluation of her AV fistula.  Current Facility-Administered Medications  Medication Dose Route Frequency Provider Last Rate Last Dose  . 0.9 %  sodium chloride infusion   Intravenous Continuous Kimberly A Stegmayer, PA-C      . cefUROXime (ZINACEF) 1.5 g in dextrose 5 % 50 mL IVPB  1.5 g Intravenous 30 min Pre-Op Kimberly A Stegmayer, PA-C      . famotidine (PEPCID) tablet 40 mg  40 mg Oral PRN Janalyn Harder Stegmayer, PA-C      . HYDROmorphone (DILAUDID) injection 1 mg  1 mg Intravenous Once [employer], PA-C      . methylPREDNISolone sodium succinate (SOLU-MEDROL) 125 mg/2 mL injection 125 mg  125 mg Intravenous PRN Kimberly A Stegmayer, PA-C      . ondansetron (ZOFRAN) injection 4 mg  4 mg Intravenous Q6H PRN Sela Hua, PA-C        Past Medical History  Diagnosis Date  . Hyperlipidemia   . Hypertension     resistant hyptertension times many years. the patient does have renal artery stenosis. she has tried calcium channel blockers in the past and states that she would not take them now because she had some problems with her gums which her dentist identified as calcium-channel blocker side effects.  . Diabetes  mellitus     type 2  . Coronary artery disease     s/p anterior MI in 1996 followed by CABG. Lexiscan myoview (4/11): EF 67%, normal perfusion with no evidence for ischemia or infarction.   . Renal artery stenosis [facility])     The patient has an occluded right renal artery and an atrophic right kidney. There is 20% left renal artery stenosis. This was seen by catheterization in 2007  . Diastolic heart failure     most recent echo (12/10) showed EF 55-60% with mild LVH, grade I diastolic dysfunction, mild LAE.  [name] Aortic valve disorder     Echo in 2009 showed mean aortic valve gradient of 13 mmHg, suggesting very mild stenosis. Echo (12/10) suggested aortic sclerosis only  . Obesity   . ESRD (end stage renal disease) on dialysis ([place])   . Obstructive sleep apnea     PSG 01/05/2006 AHI 24.8, CPAP 9cm H2O  . Mild asthma     PFT 02/05/10 FEV1 1.38, FEV1% 73, TLC 3.78 (86%), DLCO 48%, +BD  . ACE-inhibitor cough   . Hypothyroidism   . Obesity hypoventilation syndrome ([place])        . Pulmonary nodule, right   . Carotid artery disease (HCC)     mild, carotid dopplers 12/2009  . Myocardial infarction ([place])   . CHF (congestive heart failure) ([place])   . Stroke [facility])  Past Surgical History  Procedure Laterality Date  . Tubal ligation  1973  . Inner ear surgery  1979    right  . Abdominal hysterectomy  1985  . Coronary artery bypass graft  1996  . Cataract extraction  2010  . Av fistula placement    . Dialysis fistula creation    . Peripheral vascular catheterization Left 01/06/2015    Procedure: A/V Shuntogram/Fistulagram;  Surgeon: Katha Cabal, MD;  Location: [place] [facility];  Service: Cardiovascular;  Laterality: Left;  . Peripheral vascular catheterization N/A 02/17/2015    Procedure: Dialysis/Perma Catheter Removal;  Surgeon: Katha Cabal, MD;  Location: [place] [facility];  Service: Cardiovascular;  Laterality: N/A;    Social History Social History  Substance Use Topics   . Smoking status: Never Smoker   . Smokeless tobacco: Never Used  . Alcohol Use: No  No IVDU  Family History Family History  Problem Relation Age of Onset  . Kidney failure Mother   . Diabetes Mother   . Aortic aneurysm Father   . Coronary artery disease Father   . Heart attack Father   . Diabetes Sister   . Diabetes Brother   . Kidney failure Brother      Allergies  Allergen Reactions  . Ace Inhibitors Other (See Comments)    Reaction:  Unknown   . Amlodipine Other (See Comments)    Reaction:  Unknown   . Clonidine Hydrochloride Other (See Comments)    Reaction:  Unknown   . Codeine Hives  . Metformin Nausea And Vomiting     REVIEW OF SYSTEMS (Negative unless checked)  Constitutional: [] Weight loss  [] Fever  [] Chills Cardiac: [] Chest pain   [] Chest pressure   [x] Palpitations   [] Shortness of breath when laying flat   [] Shortness of breath at rest   [x] Shortness of breath with exertion. Vascular:  [] Pain in legs with walking   [] Pain in legs at rest   [] Pain in legs when laying flat   [] Claudication   [] Pain in feet when walking  [] Pain in feet at rest  [] Pain in feet when laying flat   [] History of DVT   [] Phlebitis   [] Swelling in legs   [] Varicose veins   [] Non-healing ulcers Pulmonary:   [] Uses home oxygen   [] Productive cough   [] Hemoptysis   [] Wheeze  [] COPD   [] Asthma Neurologic:  [] Dizziness  [] Blackouts   [] Seizures   [] History of stroke   [] History of TIA  [] Aphasia   [] Temporary blindness   [] Dysphagia   [] Weakness or numbness in arms   [] Weakness or numbness in legs Musculoskeletal:  [] Arthritis   [] Joint swelling   [] Joint pain   [] Low back pain Hematologic:  [] Easy bruising  [] Easy bleeding   [] Hypercoagulable state   [] Anemic  [] Hepatitis Gastrointestinal:  [] Blood in stool   [] Vomiting blood  [] Gastroesophageal reflux/heartburn   [] Difficulty swallowing. Genitourinary:  [x] Chronic kidney disease   [] Difficult urination  [] Frequent urination  [] Burning with  urination   [] Blood in urine Skin:  [] Rashes   [] Ulcers   [] Wounds Psychological:  [] History of anxiety   []  History of major depression.  Physical Examination  There were no vitals filed for this visit. There is no weight on file to calculate BMI. Gen: WD/WN, obese, NAD Head: [place]/AT, No temporalis wasting. Prominent temp pulse not noted. Ear/Nose/Throat: Hearing grossly intact, nares w/o erythema or drainage, oropharynx w/o Erythema/Exudate,  Eyes: PERRLA, EOMI.  Neck: Supple, no nuchal rigidity.  No JVD.  Pulmonary:  Good air movement, equal bilaterally, no use of accessory muscles.  Cardiac: RRR, normal S1, S2 Vascular: Weak thrill with decent bruit in left radiocephalic AV fistula Vessel Right Left  Radial Palpable Palpable                                   Gastrointestinal: soft, non-tender/non-distended. No guarding/reflex.  Musculoskeletal: M/S 5/5 throughout.  Extremities without ischemic changes.  No deformity or atrophy.  Neurologic: CN 2-12 intact. Pain and light touch intact in extremities.  Symmetrical.  Speech is fluent. Motor exam as listed above. Psychiatric: Judgment intact, Mood & affect appropriate for pt's clinical situation. Dermatologic: No rashes or ulcers noted.  No cellulitis or open wounds. Lymph : No Cervical, Axillary, or Inguinal lymphadenopathy.     CBC Lab Results  Component Value Date   WBC 6.7 08/20/2015   HGB 10.3* 08/20/2015   HCT 30.4* 08/20/2015   MCV 99.8 08/20/2015   PLT 108* 08/20/2015    BMET    Component Value Date/Time   NA 130* 08/20/2015 0405   NA 137 09/15/2014 1032   NA 137 10/21/2011 0849   K 3.8 08/20/2015 0405   K 3.7 09/15/2014 1032   CL 99* 08/20/2015 0405   CL 97* 09/15/2014 1032   CO2 27 08/20/2015 0405   CO2 32 09/15/2014 1032   GLUCOSE 101* 08/20/2015 0405   GLUCOSE 112* 09/15/2014 1032   GLUCOSE 201* 10/21/2011 0849   BUN 35* 08/20/2015 0405   BUN 38* 09/15/2014 1032   BUN 24 10/21/2011 0849    CREATININE 3.27* 08/20/2015 0405   CREATININE 4.03* 09/15/2014 1032   CREATININE 1.54* 05/10/2011 0940   CALCIUM 8.3* 08/20/2015 0405   CALCIUM 9.0 09/15/2014 1032   GFRNONAA 13* 08/20/2015 0405   GFRNONAA 11* 09/15/2014 1032   GFRAA 15* 08/20/2015 0405   GFRAA 14* 09/15/2014 1032   CrCl cannot be calculated (Unknown ideal weight.).  COAG Lab Results  Component Value Date   INR 1.2 09/12/2014    Radiology No results found.    Assessment/Plan 1. Poorly functioning dialysis access with inability to successfully cannulate the fistula on multiple occasions. For fistulogram today for further evaluation and hopeful treatment to improve the fistula function. 2. End-stage renal disease. Needs better access. 3. Diabetes. Stable on outpatient medications. 4. Hypertension. Stable on outpatient medications. 5. Coronary artery disease. Stable.   Kempton Milne, MD  01/28/2016 9:08 AM

## 2016-01-28 NOTE — Op Note (Signed)
VEIN AND VASCULAR SURGERY    OPERATIVE NOTE   PROCEDURE: 1.   Left radiocephalic arteriovenous fistula cannulation under ultrasound guidance 2.   Left arm fistulagram including central venogram 3.   Percutaneous transluminal angioplasty of the anastomotic stenosis with 4 mm diameter by 4 cm length Lutonix drug-coated angioplasty balloon 4.   Percutaneous transluminal angioplasty of the forearm cephalic vein stenosis within the previous stent with 98m diameter by 6 cm length high pressure angioplasty balloon  PRE-OPERATIVE DIAGNOSIS: 1. ESRD 2. Poorly functional left radiocephalic AVF  POST-OPERATIVE DIAGNOSIS: same as above   SURGEON: JLeotis Pain MD  ANESTHESIA: local with MCS  ESTIMATED BLOOD LOSS: minimal  FINDING(S): 1. High grade stenosis at the anastomosis and in the cephalic vein stent  SPECIMEN(S):  None  CONTRAST: 35 cc  FLUORO TIME: 2 minutes  MODERATE CONSCIOUS SEDATION TIME: Approximately 20 minutes with 2 mg of Versed and 50 mcg of Fentanyl   INDICATIONS: Dawn COULSTONis a 80y.o. female who presents with malfunctioning left radiocephalic arteriovenous fistula.  The patient is scheduled for left arm fistulagram.  The patient is aware the risks include but are not limited to: bleeding, infection, thrombosis of the cannulated access, and possible anaphylactic reaction to the contrast.  The patient is aware of the risks of the procedure and elects to proceed forward.  DESCRIPTION: After full informed written consent was obtained, the patient was brought back to the angiography suite and placed supine upon the angiography table.  The patient was connected to monitoring equipment. Moderate conscious sedation was administered with a face to face encounter with the patient throughout the procedure with my supervision of the RN administering medicines and monitoring the patient's vital signs and mental status throughout from the start of the procedure until the  patient was taken to the recovery room. The left arm was prepped and draped in the standard fashion for a percutaneous access intervention.  Under ultrasound guidance, the  Left radiocephalic arteriovenous fistula was cannulated with a micropuncture needle under direct ultrasound guidance in a retrograde fashion just below the antecubital fossa and a permanent image was performed.  The microwire was advanced into the fistula and the needle was exchanged for the a microsheath.  I then upsized to a 6 Fr Sheath and imaging was performed.  Hand injections were completed to image the access including the central venous system. This demonstrated high grade stenosis at the anastomosis and in the cephalic vein stent.  Based on the images, this patient will need intervention to both of these areas. I then gave the patient 3000 units of intravenous heparin.  I then crossed the stenosis with a Magic Tourqe wire.  Based on the imaging, a 4 mm x 4 cm  Lutonix drug coated angioplasty balloon was selected.  The balloon was centered around the anastomotic stenosis and inflated to 12 ATM for 1 minute(s).  On completion imaging, a 20 % residual stenosis was present.   I then turned my attention to the forearm cephalic vein stenosis within the previously placed stent. An 8 mm diameter by 6 cm length high pressure angioplasty balloon was selected for this location. It was centered around the stenosis and inflated to 14 atm for 1 minute. On completion imaging, about a 15-20% residual stenosis was present. The remainder of the fistula had brisk flow with dual outflow in the upper arm and patent central venous circulation. Based on the completion imaging, no further intervention is necessary.  The wire  and balloon were removed from the sheath.  A 4-0 Monocryl purse-string suture was sewn around the sheath.  The sheath was removed while tying down the suture.  A sterile bandage was applied to the puncture site.  COMPLICATIONS:  None  CONDITION: Stable   Dawn Bradford  01/28/2016 11:27 AM

## 2016-01-28 NOTE — H&P (Signed)
  [facility] History & Physical Update  The patient was interviewed and re-examined.  The patient's previous History and Physical has been reviewed and is unchanged.  There is no change in the plan of care. We plan to proceed with the scheduled procedure.  Norm Wray, MD  01/28/2016, 8:40 AM

## 2016-01-28 NOTE — Discharge Instructions (Signed)
Fistulogram, Care After °Refer to this sheet in the next few weeks. These instructions provide you with information on caring for yourself after your procedure. Your health care provider may also give you more specific instructions. Your treatment has been planned according to current medical practices, but problems sometimes occur. Call your health care provider if you have any problems or questions after your procedure. °WHAT TO EXPECT AFTER THE PROCEDURE °After your procedure, it is typical to have the following: °· A small amount of discomfort in the area where the catheters were placed. °· A small amount of bruising around the fistula. °· Sleepiness and fatigue. °HOME CARE INSTRUCTIONS °· Rest at home for the day following your procedure. °· Do not drive or operate heavy machinery while taking pain medicine. °· Take medicines only as directed by your health care provider. °· Do not take baths, swim, or use a hot tub until your health care provider approves. You may shower 24 hours after the procedure or as directed by your health care provider. °· There are many different ways to close and cover an incision, including stitches, skin glue, and adhesive strips. Follow your health care provider's instructions on: °¨ Incision care. °¨ Bandage (dressing) changes and removal. °¨ Incision closure removal. °· Monitor your dialysis fistula carefully. °SEEK MEDICAL CARE IF: °· You have drainage, redness, swelling, or pain at your catheter site. °· You have a fever. °· You have chills. °SEEK IMMEDIATE MEDICAL CARE IF: °· You feel weak. °· You have trouble balancing. °· You have trouble moving your arms or legs. °· You have problems with your speech or vision. °· You can no longer feel a vibration or buzz when you put your fingers over your dialysis fistula. °· The limb that was used for the procedure: °¨ Swells. °¨ Is painful. °¨ Is cold. °¨ Is discolored, such as blue or pale white. °  °This information is not intended  to replace advice given to you by your health care provider. Make sure you discuss any questions you have with your health care provider. °  °Document Released: 01/06/2014 Document Reviewed: 01/06/2014 °Elsevier Interactive Patient Education ©2016 [employer] ° °

## 2016-01-29 ENCOUNTER — Encounter: Payer: Self-pay | Admitting: Vascular Surgery

## 2016-02-02 ENCOUNTER — Encounter: Payer: Self-pay | Admitting: Adult Health

## 2016-02-02 ENCOUNTER — Ambulatory Visit (INDEPENDENT_AMBULATORY_CARE_PROVIDER_SITE_OTHER): Payer: Medicare Other | Admitting: Adult Health

## 2016-02-02 VITALS — BP 142/90 | HR 63 | Temp 98.7°F | Ht 62.0 in | Wt 215.0 lb

## 2016-02-02 DIAGNOSIS — I6523 Occlusion and stenosis of bilateral carotid arteries: Secondary | ICD-10-CM | POA: Diagnosis not present

## 2016-02-02 DIAGNOSIS — G4733 Obstructive sleep apnea (adult) (pediatric): Secondary | ICD-10-CM | POA: Diagnosis not present

## 2016-02-02 NOTE — Progress Notes (Signed)
Reviewed and agree with assessment/plan.  Chesley Mires, MD [facility] Pulmonary/Critical Care 02/02/2016, 11:20 PM Pager:  [phone]

## 2016-02-02 NOTE — Patient Instructions (Signed)
Please check ONO on CPAP ( with no Oxygen ) this is for your insurance and home care company requirements to cover your oxygen. Once test results are back will call you.  Continue on CPAP At bedtime   Work on weight loss.  follow up Dr. Halford Chessman  In 1 year and As needed

## 2016-02-02 NOTE — Progress Notes (Signed)
Subjective:    Patient ID: Dawn Bradford, female    DOB: [birth year], 80 y.o.   MRN: [id number]  HPI  80 year old female with mild intermittent asthma, OSA\OHS and diastolic dysfunction.  TEST  Tests: PSG 01/05/06>>AHI 24.8  V/Q scan 01/26/10>>very low probability for PE PFT 02/05/10>>FEV1 1.38(77%), FEV1% 73, TLC 3.78(86%), DLCO 48%, +BD CT chest 05/31/11>>5 mm RUL nodule stable for 2 yrs Auto CPAP 08/08/11 to 08/22/11>>Used on 15 of 15 nights with average 10 hrs 6 min. Average AHI 3.8 with mean CPAP 8 cm H2O and 95th percentile CPAP 11 cm H2O. Auto CPAP 10/25/13 to 11/05/13 >> Used on 12 of 12 nights with average 10 hrs 46 min. Average AHI 3.3 with mean CPAP 9 cm H2O and 90 th percentile CPAP 12 cm H2O. Echo 02/01/15 >> EF 55 to 60%, PAS 43 mmHg  02/02/2016 Follow up : OSA on CPAP w/ O2  Patient returns for a 1 year follow-up. Pt states breathing is doing well and has no new complaints at this time. Uses CPAP 10-11 hours nightly, mask fits well.  She has been contacted by DME that she need O2 qualification for insurance coverage.  She is on CPAP at bedtime with O2 at 2l/m.  C Pap download shows excellent compliance with 100% usage .She is on set pressure at 8 cm of H2O. AHI 1.0. Minimum leaks. Feels rested .   She remains on HD on M/W/F . Does feel her breathing has really improved since being on this. SOB is less.   Past Medical History  Diagnosis Date  . Hyperlipidemia   . Hypertension     resistant hyptertension times many years. the patient does have renal artery stenosis. she has tried calcium channel blockers in the past and states that she would not take them now because she had some problems with her gums which her dentist identified as calcium-channel blocker side effects.  . Diabetes mellitus     type 2  . Coronary artery disease     s/p anterior MI in 1996 followed by CABG. Lexiscan myoview (4/11): EF 67%, normal perfusion with no evidence for ischemia or infarction.   .  Renal artery stenosis [facility])     The patient has an occluded right renal artery and an atrophic right kidney. There is 20% left renal artery stenosis. This was seen by catheterization in 2007  . Diastolic heart failure     most recent echo (12/10) showed EF 55-60% with mild LVH, grade I diastolic dysfunction, mild LAE.  [name] Aortic valve disorder     Echo in 2009 showed mean aortic valve gradient of 13 mmHg, suggesting very mild stenosis. Echo (12/10) suggested aortic sclerosis only  . Obesity   . ESRD (end stage renal disease) on dialysis ([place])   . Obstructive sleep apnea     PSG 01/05/2006 AHI 24.8, CPAP 9cm H2O  . Mild asthma     PFT 02/05/10 FEV1 1.38, FEV1% 73, TLC 3.78 (86%), DLCO 48%, +BD  . ACE-inhibitor cough   . Hypothyroidism   . Obesity hypoventilation syndrome ([place])        . Pulmonary nodule, right   . Carotid artery disease (HCC)     mild, carotid dopplers 12/2009  . Myocardial infarction ([place])   . CHF (congestive heart failure) ([place])   . Stroke [facility])    Current Outpatient Prescriptions on File Prior to Visit  Medication Sig Dispense Refill  . acetaminophen (TYLENOL) 500 MG tablet Take 1,000 mg  by mouth every 6 (six) hours as needed for mild pain.    [name] albuterol (PROVENTIL HFA;VENTOLIN HFA) 108 (90 BASE) MCG/ACT inhaler Inhale 2 puffs into the lungs every 6 (six) hours as needed for wheezing or shortness of breath.    [name] aspirin EC 81 MG tablet Take 81 mg by mouth daily.    . carvedilol (COREG) 25 MG tablet Take 25 mg by mouth 2 (two) times daily.    . Cholecalciferol (VITAMIN D3) 5000 UNITS TABS Take 5,000 Units by mouth daily.    . clopidogrel (PLAVIX) 75 MG tablet Take 1 tablet (75 mg total) by mouth daily. 30 tablet 2  . ferrous fumarate-iron polysaccharide complex (TANDEM) 162-115.2 MG CAPS capsule Take 1 capsule by mouth daily.    [name] FLUoxetine (PROZAC) 20 MG capsule Take 20 mg by mouth at bedtime.    . furosemide (LASIX) 40 MG tablet Take 40 mg by mouth 2 (two) times daily.  Pt only takes on Tuesday, Thursday, Saturday, and Sunday.    . hydrALAZINE (APRESOLINE) 25 MG tablet Take 25 mg by mouth 3 (three) times daily.    . insulin glargine (LANTUS) 100 UNIT/ML injection Inject 30 Units into the skin at bedtime.    . isosorbide mononitrate (IMDUR) 60 MG 24 hr tablet Take 1 tablet (60 mg total) by mouth daily. 30 tablet 6  . levothyroxine (SYNTHROID, LEVOTHROID) 75 MCG tablet Take 75 mcg by mouth daily.     [name] lidocaine-prilocaine (EMLA) cream Apply 1 application topically as needed (for pain).    . nystatin (MYCOSTATIN/NYSTOP) 100000 UNIT/GM POWD Apply 1 g topically as needed (for irritation). Reported on 01/28/2016    . rosuvastatin (CRESTOR) 40 MG tablet Take 40 mg by mouth at bedtime.    [name] telmisartan (MICARDIS) 80 MG tablet Take 80 mg by mouth daily.    . traZODone (DESYREL) 50 MG tablet Take 50 mg by mouth at bedtime.     . triamcinolone (KENALOG) 0.5 % cream Apply 1 application topically as needed (for irritation).     . insulin lispro (HUMALOG) 100 UNIT/ML injection Inject into the skin 3 (three) times daily before meals. Reported on 02/02/2016     No current facility-administered medications on file prior to visit.       Review of Systems Constitutional:   No  weight loss, night sweats,  Fevers, chills, fatigue, or  lassitude.  HEENT:   No headaches,  Difficulty swallowing,  Tooth/dental problems, or  Sore throat,                No sneezing, itching, ear ache, nasal congestion, post nasal drip,   CV:  No chest pain,  Orthopnea, PND, swelling in lower extremities, anasarca, dizziness, palpitations, syncope.   GI  No heartburn, indigestion, abdominal pain, nausea, vomiting, diarrhea, change in bowel habits, loss of appetite, bloody stools.   Resp:   No chest wall deformity  Skin: no rash or lesions.  GU: no dysuria, change in color of urine, no urgency or frequency.  No flank pain, no hematuria   MS:  No joint pain or swelling.  No decreased range of  motion.  No back pain.  Psych:  No change in mood or affect. No depression or anxiety.  No memory loss.          Objective:   Physical Exam  Filed Vitals:   02/02/16 1104  Pulse: 63  Temp: 98.7 F (37.1 C)  TempSrc: Oral  Height: 5\' 2"  (1.575 m)  Weight: 215 lb (97.523 kg)  SpO2: 93%   GEN: A/Ox3; pleasant , NAD, obese   HEENT:  [place]/AT,  EACs-clear, TMs-wnl, NOSE-clear, THROAT-clear, no lesions, no postnasal drip or exudate noted. Class 2-3 MP airway   NECK:  Supple w/ fair ROM; no JVD; normal carotid impulses w/o bruits; no thyromegaly or nodules palpated; no lymphadenopathy.  RESP  Clear  P & A; w/o, wheezes/ rales/ or rhonchi.no accessory muscle use, no dullness to percussion  CARD:  RRR, no m/r/g  , no peripheral edema, pulses intact, no cyanosis or clubbing.  GI:   Soft & nt; nml bowel sounds; no organomegaly or masses detected.  Musco: Warm bil, no deformities or joint swelling noted.   Neuro: alert, no focal deficits noted.    Skin: Warm, no lesions or rashes  Tammy Parrett NP-C  [place]  02/02/2016      Assessment & Plan:

## 2016-02-02 NOTE — Assessment & Plan Note (Signed)
OSA/OHS on CPAP , doing well on CPAP  Download shows excellent control.  Check ONO on CPAP w/out O2.   Plan  Please check ONO on CPAP ( with no Oxygen ) this is for your insurance and home care company requirements to cover your oxygen. Once test results are back will call you.  Continue on CPAP At bedtime   Work on weight loss.  follow up Dr. Halford Chessman  In 1 year and As needed

## 2016-02-12 ENCOUNTER — Encounter: Payer: Self-pay | Admitting: Adult Health

## 2016-06-16 ENCOUNTER — Encounter: Payer: Self-pay | Admitting: Cardiovascular Disease

## 2016-06-16 ENCOUNTER — Ambulatory Visit (INDEPENDENT_AMBULATORY_CARE_PROVIDER_SITE_OTHER): Payer: Medicare Other | Admitting: Cardiovascular Disease

## 2016-06-16 VITALS — BP 150/60 | HR 75 | Ht 63.0 in | Wt 212.0 lb

## 2016-06-16 DIAGNOSIS — I10 Essential (primary) hypertension: Secondary | ICD-10-CM

## 2016-06-16 DIAGNOSIS — I209 Angina pectoris, unspecified: Secondary | ICD-10-CM | POA: Diagnosis not present

## 2016-06-16 DIAGNOSIS — I25119 Atherosclerotic heart disease of native coronary artery with unspecified angina pectoris: Secondary | ICD-10-CM

## 2016-06-16 DIAGNOSIS — E785 Hyperlipidemia, unspecified: Secondary | ICD-10-CM | POA: Diagnosis not present

## 2016-06-16 DIAGNOSIS — I5032 Chronic diastolic (congestive) heart failure: Secondary | ICD-10-CM | POA: Diagnosis not present

## 2016-06-16 DIAGNOSIS — I359 Nonrheumatic aortic valve disorder, unspecified: Secondary | ICD-10-CM | POA: Diagnosis not present

## 2016-06-16 DIAGNOSIS — I6523 Occlusion and stenosis of bilateral carotid arteries: Secondary | ICD-10-CM | POA: Diagnosis not present

## 2016-06-16 NOTE — Progress Notes (Signed)
Cardiology Office Note   Date:  06/16/2016   ID:  Dawn Bradford, DOB June 08, 1936, MRN [id number]  PCP:  Dawn Pilar, MD  Cardiologist:   Dawn Sacramento, MD   Chief Complaint  Patient presents with  . other    6 month follow up. "doing well."       History of Present Illness: Dawn Bradford is a 80 y.o. female who presents for A follow-up visit regarding coronary artery disease. She has known history of coronary artery disease status post CABG (in 68 in [place]), resistant hypertension, renal artery stenosis, morbid obesity, diastolic congestive heart failure, end stage renal disease on hemodialysis, and OHS/OSA . Echocardiogram in May, 2016  showed normal LV systolic function, aortic sclerosis without significant stenosis and mild pulmonary hypertension.  She was hospitalized in May of 2016 for stroke and was placed on Plavix.  Nuclear stress test in December 2016 showed no evidence of ischemia with normal ejection fraction. There was a small fixed apical defect suggestive of breast attenuation.  She had episodes of angina during dialysis but her symptoms resolved completely with antianginal medications. She is doing very well and denies any chest pain or worsening dyspnea. She reports no complications with dialysis.     Past Medical History:  Diagnosis Date  . ACE-inhibitor cough   . Aortic valve disorder    Echo in 2009 showed mean aortic valve gradient of 13 mmHg, suggesting very mild stenosis. Echo (12/10) suggested aortic sclerosis only  . Carotid artery disease (HCC)    mild, carotid dopplers 12/2009  . CHF (congestive heart failure) ([place])   . Coronary artery disease    s/p anterior MI in 1996 followed by CABG. Lexiscan myoview (4/11): EF 67%, normal perfusion with no evidence for ischemia or infarction.   . Diabetes mellitus    type 2  . Diastolic heart failure    most recent echo (12/10) showed EF 55-60% with mild LVH, grade I diastolic dysfunction, mild LAE.  Dawn Bradford  ESRD (end stage renal disease) on dialysis ([place])   . Hyperlipidemia   . Hypertension    resistant hyptertension times many years. the patient does have renal artery stenosis. she has tried calcium channel blockers in the past and states that she would not take them now because she had some problems with her gums which her dentist identified as calcium-channel blocker side effects.  . Hypothyroidism   . Mild asthma    PFT 02/05/10 FEV1 1.38, FEV1% 73, TLC 3.78 (86%), DLCO 48%, +BD  . Myocardial infarction   . Obesity   . Obesity hypoventilation syndrome ([place])       . Obstructive sleep apnea    PSG 01/05/2006 AHI 24.8, CPAP 9cm H2O  . Pulmonary nodule, right   . Renal artery stenosis [facility])    The patient has an occluded right renal artery and an atrophic right kidney. There is 20% left renal artery stenosis. This was seen by catheterization in 2007  . Stroke [facility])     Past Surgical History:  Procedure Laterality Date  . ABDOMINAL HYSTERECTOMY  1985  . AV FISTULA PLACEMENT    . CATARACT EXTRACTION  2010  . CORONARY ARTERY BYPASS GRAFT  1996  . DIALYSIS FISTULA CREATION    . [place]   right  . PERIPHERAL VASCULAR CATHETERIZATION Left 01/06/2015   Procedure: A/V Shuntogram/Fistulagram;  Surgeon: Dawn Cabal, MD;  Location: [place] CV [facility];  Service: Cardiovascular;  Laterality: Left;  .  PERIPHERAL VASCULAR CATHETERIZATION N/A 02/17/2015   Procedure: Dialysis/Perma Catheter Removal;  Surgeon: Dawn Cabal, MD;  Location: [place] CV LAB;  Service: Cardiovascular;  Laterality: N/A;  . PERIPHERAL VASCULAR CATHETERIZATION N/A 01/28/2016   Procedure: A/V Shuntogram/Fistulagram;  Surgeon: Dawn Huxley, MD;  Location: [place] CV [facility];  Service: Cardiovascular;  Laterality: N/A;  . PERIPHERAL VASCULAR CATHETERIZATION N/A 01/28/2016   Procedure: A/V Shunt Intervention;  Surgeon: Dawn Huxley, MD;  Location: [place] CV LAB;  Service: Cardiovascular;   Laterality: N/A;  . TUBAL LIGATION  1973     Current Outpatient Prescriptions  Medication Sig Dispense Refill  . acetaminophen (TYLENOL) 500 MG tablet Take 1,000 mg by mouth every 6 (six) hours as needed for mild pain.    Dawn Bradford albuterol (PROVENTIL HFA;VENTOLIN HFA) 108 (90 BASE) MCG/ACT inhaler Inhale 2 puffs into the lungs every 6 (six) hours as needed for wheezing or shortness of breath.    Dawn Bradford aspirin EC 81 MG tablet Take 81 mg by mouth daily.    . carvedilol (COREG) 25 MG tablet Take 25 mg by mouth 2 (two) times daily.    . Cholecalciferol (VITAMIN D3) 5000 UNITS TABS Take 5,000 Units by mouth daily.    . clopidogrel (PLAVIX) 75 MG tablet Take 1 tablet (75 mg total) by mouth daily. 30 tablet 2  . ferrous fumarate-iron polysaccharide complex (TANDEM) 162-115.2 MG CAPS capsule Take 1 capsule by mouth daily.    Dawn Bradford FLUoxetine (PROZAC) 20 MG capsule Take 20 mg by mouth at bedtime.    . furosemide (LASIX) 40 MG tablet Take 40 mg by mouth 2 (two) times daily. Pt only takes on Tuesday, Thursday, Saturday, and Sunday.    . hydrALAZINE (APRESOLINE) 25 MG tablet Take 25 mg by mouth 3 (three) times daily.    . insulin glargine (LANTUS) 100 UNIT/ML injection Inject 30 Units into the skin at bedtime.    . isosorbide mononitrate (IMDUR) 60 MG 24 hr tablet Take 1 tablet (60 mg total) by mouth daily. 30 tablet 6  . levothyroxine (SYNTHROID, LEVOTHROID) 75 MCG tablet Take 75 mcg by mouth daily.     Dawn Bradford lidocaine-prilocaine (EMLA) cream Apply 1 application topically as needed (for pain).    . nystatin (MYCOSTATIN/NYSTOP) 100000 UNIT/GM POWD Apply 1 g topically as needed (for irritation). Reported on 01/28/2016    . rosuvastatin (CRESTOR) 40 MG tablet Take 40 mg by mouth at bedtime.    Dawn Bradford telmisartan (MICARDIS) 80 MG tablet Take 80 mg by mouth daily.    . traZODone (DESYREL) 50 MG tablet Take 50 mg by mouth at bedtime.     . triamcinolone (KENALOG) 0.5 % cream Apply 1 application topically as needed (for irritation).       No current facility-administered medications for this visit.     Allergies:   Ace inhibitors; Amlodipine; Clonidine hydrochloride; Codeine; and Metformin    Social History:  The patient  reports that she has never smoked. She has never used smokeless tobacco. She reports that she does not drink alcohol or use drugs.   Family History:  The patient's family history includes Aortic aneurysm in her father; Coronary artery disease in her father; Diabetes in her brother, mother, and sister; Heart attack in her father; Kidney failure in her brother and mother.    ROS:  Please see the history of present illness.   Otherwise, review of systems are positive for none.   All other systems are reviewed and negative.  PHYSICAL EXAM: VS:  BP (!) 150/60 (BP Location: Right Wrist, Patient Position: Sitting, Cuff Size: Normal)   Pulse 75   Ht 5\' 3"  (1.6 m)   Wt 212 lb (96.2 kg)   LMP 02/01/1986 (Approximate)   BMI 37.55 kg/m  , BMI Body mass index is 37.55 kg/m. GEN: Well nourished, well developed, in no acute distress HEENT: normal Neck: no JVD,  or masses. Faint left carotid bruit Cardiac: RRR; no rubs, or gallops,no edema . There is a 2 out of 6 early peaking systolic murmur in the aortic area Respiratory:  clear to auscultation bilaterally, normal work of breathing GI: soft, nontender, nondistended, + BS MS: no deformity or atrophy Skin: warm and dry, no rash Neuro:  Strength and sensation are intact Psych: euthymic mood, full affect   EKG:  EKG is ordered today.  Normal sinus rhythm with moderate LVH and nonspecific ST changes.  Recent Labs: 08/19/2015: ALT 14 08/20/2015: BUN 35; Creatinine, Ser 3.27; Hemoglobin 10.3; Platelets 108; Potassium 3.8; Sodium 130    Lipid Panel    Component Value Date/Time   CHOL 122 02/01/2015 0433   CHOL 105 09/12/2014 1353   TRIG 68 02/01/2015 0433   TRIG 95 09/12/2014 1353   TRIG 95 12/08/2009   HDL 43 02/01/2015 0433   HDL 44  09/12/2014 1353   CHOLHDL 2.8 02/01/2015 0433   VLDL 14 02/01/2015 0433   VLDL 19 09/12/2014 1353   LDLCALC 65 02/01/2015 0433   LDLCALC 42 09/12/2014 1353      Wt Readings from Last 3 Encounters:  06/16/16 212 lb (96.2 kg)  02/02/16 215 lb (97.5 kg)  01/28/16 220 lb (99.8 kg)       ASSESSMENT AND PLAN:  1.  Coronary artery disease involving native coronary arteries with stable angina: The patient's symptoms of chest pain during dialysis has resolved completely. Continue medical therapy.   2. Bilateral carotid stenosis: Repeat carotid Doppler in May showed only mild bilateral disease. Continue treatment of risk factors.  3. Essential hypertension:  Blood pressure is mildly elevated today but I made no changes in her medications to avoid hypotension during dialysis.  4. Hyperlipidemia: Continue high-dose rosuvastatin. Target LDL is less than 70.  5. Mild aortic stenosis: I will consider repeat echocardiogram next year.    Disposition:   FU with me in 6 months  Signed,  Dawn Sacramento, MD  06/16/2016 2:20 PM    [place]

## 2016-06-16 NOTE — Patient Instructions (Signed)
Medication Instructions: Continue same medications.   Labwork: None.   Procedures/Testing: None.   Follow-Up: 6 months with Dr. Arida.   Any Additional Special Instructions Will Be Listed Below (If Applicable).     If you need a refill on your cardiac medications before your next appointment, please call your pharmacy.   

## 2016-07-06 ENCOUNTER — Other Ambulatory Visit (INDEPENDENT_AMBULATORY_CARE_PROVIDER_SITE_OTHER): Payer: Self-pay | Admitting: Vascular Surgery

## 2016-07-06 DIAGNOSIS — N186 End stage renal disease: Secondary | ICD-10-CM

## 2016-07-06 DIAGNOSIS — Z992 Dependence on renal dialysis: Secondary | ICD-10-CM

## 2016-07-06 DIAGNOSIS — T829XXA Unspecified complication of cardiac and vascular prosthetic device, implant and graft, initial encounter: Secondary | ICD-10-CM

## 2016-07-07 ENCOUNTER — Ambulatory Visit (INDEPENDENT_AMBULATORY_CARE_PROVIDER_SITE_OTHER): Payer: Self-pay | Admitting: Vascular Surgery

## 2016-07-07 ENCOUNTER — Ambulatory Visit (INDEPENDENT_AMBULATORY_CARE_PROVIDER_SITE_OTHER): Payer: Medicare Other | Admitting: Vascular Surgery

## 2016-07-07 ENCOUNTER — Ambulatory Visit (INDEPENDENT_AMBULATORY_CARE_PROVIDER_SITE_OTHER): Payer: Medicare Other

## 2016-07-07 ENCOUNTER — Encounter (INDEPENDENT_AMBULATORY_CARE_PROVIDER_SITE_OTHER): Payer: Self-pay | Admitting: Vascular Surgery

## 2016-07-07 VITALS — BP 197/73 | HR 76 | Resp 17 | Ht 62.0 in | Wt 213.0 lb

## 2016-07-07 DIAGNOSIS — I6523 Occlusion and stenosis of bilateral carotid arteries: Secondary | ICD-10-CM

## 2016-07-07 DIAGNOSIS — E785 Hyperlipidemia, unspecified: Secondary | ICD-10-CM | POA: Diagnosis not present

## 2016-07-07 DIAGNOSIS — T829XXA Unspecified complication of cardiac and vascular prosthetic device, implant and graft, initial encounter: Secondary | ICD-10-CM | POA: Diagnosis not present

## 2016-07-07 DIAGNOSIS — E1169 Type 2 diabetes mellitus with other specified complication: Secondary | ICD-10-CM | POA: Diagnosis not present

## 2016-07-07 DIAGNOSIS — Z992 Dependence on renal dialysis: Secondary | ICD-10-CM

## 2016-07-07 DIAGNOSIS — N186 End stage renal disease: Secondary | ICD-10-CM

## 2016-07-07 DIAGNOSIS — E669 Obesity, unspecified: Secondary | ICD-10-CM

## 2016-07-07 NOTE — Progress Notes (Signed)
Subjective:    Patient ID: Dawn Bradford, female    DOB: July 30, 1936, 80 y.o.   MRN: [id number] Chief Complaint  Patient presents with  . Re-evaluation    3-4 month ultrasound follow up   Patient presents for a hemodialysis four month access follow up. The patient underwent a duplex ultrasound of the AV access which was notable for a patent fistula / stent without any significant hemodynamic stenosis. The patient denies any issues with hemodialysis such as cannulation problems, increased bleeding, decrease in doppler flow or recirculation. The patient also denies any fistula skin breakdown, pain, edema, pallor or ulceration of the arm / hand.    Review of Systems  Constitutional: Negative.   HENT: Negative.   Eyes: Negative.   Respiratory: Negative.   Cardiovascular: Negative.   Gastrointestinal: Negative.   Endocrine: Negative.   Genitourinary:       ESRD  Musculoskeletal: Negative.   Skin: Negative.   Allergic/Immunologic: Negative.   Neurological: Negative.   Hematological: Negative.   Psychiatric/Behavioral: Negative.        Objective:   Physical Exam  Constitutional: She is oriented to person, place, and time. She appears well-developed and well-nourished.  HENT:  Head: Normocephalic and atraumatic.  Eyes: Conjunctivae and EOM are normal. Pupils are equal, round, and reactive to light.  Neck: Normal range of motion.  Cardiovascular: Normal rate, normal heart sounds and intact distal pulses.   Pulses:      Radial pulses are 2+ on the right side, and 2+ on the left side.       Dorsalis pedis pulses are 1+ on the right side, and 1+ on the left side.       Posterior tibial pulses are 1+ on the right side, and 1+ on the left side.  Left Upper Extremity Fistula: Good thrill and bruit. Skin intact.   Pulmonary/Chest: Effort normal and breath sounds normal.  Abdominal: Soft. Bowel sounds are normal.  Musculoskeletal: Normal range of motion. She exhibits edema (Mild Edema  Bilaterally).  Neurological: She is alert and oriented to person, place, and time.  Skin: Skin is warm and dry.  Psychiatric: She has a normal mood and affect. Her behavior is normal. Judgment and thought content normal.   BP (!) 197/73 (BP Location: Right Arm)   Pulse 76   Resp 17   Ht 5\' 2"  (1.575 m)   Wt 213 lb (96.6 kg)   LMP 02/01/1986 (Approximate)   BMI 38.96 kg/m   Past Medical History:  Diagnosis Date  . ACE-inhibitor cough   . Aortic valve disorder    Echo in 2009 showed mean aortic valve gradient of 13 mmHg, suggesting very mild stenosis. Echo (12/10) suggested aortic sclerosis only  . Carotid artery disease (HCC)    mild, carotid dopplers 12/2009  . CHF (congestive heart failure) ([place])   . Coronary artery disease    s/p anterior MI in 1996 followed by CABG. Lexiscan myoview (4/11): EF 67%, normal perfusion with no evidence for ischemia or infarction.   . Diabetes mellitus    type 2  . Diastolic heart failure    most recent echo (12/10) showed EF 55-60% with mild LVH, grade I diastolic dysfunction, mild LAE.  [name] ESRD (end stage renal disease) on dialysis ([place])   . Hyperlipidemia   . Hypertension    resistant hyptertension times many years. the patient does have renal artery stenosis. she has tried calcium channel blockers in the past and states that she would  not take them now because she had some problems with her gums which her dentist identified as calcium-channel blocker side effects.  . Hypothyroidism   . Mild asthma    PFT 02/05/10 FEV1 1.38, FEV1% 73, TLC 3.78 (86%), DLCO 48%, +BD  . Myocardial infarction   . Obesity   . Obesity hypoventilation syndrome ([place])       . Obstructive sleep apnea    PSG 01/05/2006 AHI 24.8, CPAP 9cm H2O  . Pulmonary nodule, right   . Renal artery stenosis [facility])    The patient has an occluded right renal artery and an atrophic right kidney. There is 20% left renal artery stenosis. This was seen by catheterization in 2007  . Stroke  [facility])    Social History   Social History  . Marital status: Married    Spouse name: N/A  . Number of children: N/A  . Years of education: N/A   Occupational History  . Not on file.   Social History Main Topics  . Smoking status: Never Smoker  . Smokeless tobacco: Never Used  . Alcohol use No  . Drug use: No  . Sexual activity: Not on file   Other Topics Concern  . Not on file   Social History Narrative  . No narrative on file   Past Surgical History:  Procedure Laterality Date  . ABDOMINAL HYSTERECTOMY  1985  . AV FISTULA PLACEMENT    . CATARACT EXTRACTION  2010  . CORONARY ARTERY BYPASS GRAFT  1996  . DIALYSIS FISTULA CREATION    . [place]   right  . PERIPHERAL VASCULAR CATHETERIZATION Left 01/06/2015   Procedure: A/V Shuntogram/Fistulagram;  Surgeon: Katha Cabal, MD;  Location: [place] [facility];  Service: Cardiovascular;  Laterality: Left;  . PERIPHERAL VASCULAR CATHETERIZATION N/A 02/17/2015   Procedure: Dialysis/Perma Catheter Removal;  Surgeon: Katha Cabal, MD;  Location: [place] [facility];  Service: Cardiovascular;  Laterality: N/A;  . PERIPHERAL VASCULAR CATHETERIZATION N/A 01/28/2016   Procedure: A/V Shuntogram/Fistulagram;  Surgeon: Algernon Huxley, MD;  Location: [place] [facility];  Service: Cardiovascular;  Laterality: N/A;  . PERIPHERAL VASCULAR CATHETERIZATION N/A 01/28/2016   Procedure: A/V Shunt Intervention;  Surgeon: Algernon Huxley, MD;  Location: [place] [facility];  Service: Cardiovascular;  Laterality: N/A;  . TUBAL LIGATION  1973   Family History  Problem Relation Age of Onset  . Kidney failure Mother   . Diabetes Mother   . Aortic aneurysm Father   . Coronary artery disease Father   . Heart attack Father   . Diabetes Sister   . Diabetes Brother   . Kidney failure Brother    Allergies  Allergen Reactions  . Ace Inhibitors Other (See Comments)    Reaction:  Unknown   . Amlodipine Other (See Comments)     Reaction:  Unknown   . Clonidine Hydrochloride Other (See Comments)    Reaction:  Unknown   . Codeine Hives  . Metformin Nausea And Vomiting      Assessment & Plan:  Patient presents for a hemodialysis four month access follow up. The patient underwent a duplex ultrasound of the AV access which was notable for a patent fistula / stent without any significant hemodynamic stenosis. The patient denies any issues with hemodialysis such as cannulation problems, increased bleeding, decrease in doppler flow or recirculation. The patient also denies any fistula skin breakdown, pain, edema, pallor or ulceration of the arm / hand.  1. ESRD on dialysis ([place]) - Stable Studies reviewed with patient. The patient is doing well and currently has adequate dialysis access. Duplex ultrasound of the AV access shows a patent access with no evidence of hemodynamically significant strictures or stenosis.  The patient should continue to have duplex ultrasounds of the dialysis access every three to four months. The patient was instructed to call the office in the interim if any issues with dialysis access / doppler flow, pain, edema, pallor, fistula skin breakdown or ulceration of the arm / hand occur. The patient expressed their understanding.  2. Diabetes mellitus type 2 in obese ([place]) - Stable Encouraged good control as its slows the progression of atherosclerotic disease.  3. Hyperlipidemia, unspecified hyperlipidemia type - Stable Encouraged good control as its slows the progression of atherosclerotic disease  Current Outpatient Prescriptions on File Prior to Visit  Medication Sig Dispense Refill  . acetaminophen (TYLENOL) 500 MG tablet Take 1,000 mg by mouth every 6 (six) hours as needed for mild pain.    [name] albuterol (PROVENTIL HFA;VENTOLIN HFA) 108 (90 BASE) MCG/ACT inhaler Inhale 2 puffs into the lungs every 6 (six) hours as needed for wheezing or shortness of breath.    [name] aspirin EC 81 MG tablet Take 81  mg by mouth daily.    . carvedilol (COREG) 25 MG tablet Take 25 mg by mouth 2 (two) times daily.    . Cholecalciferol (VITAMIN D3) 5000 UNITS TABS Take 5,000 Units by mouth daily.    . clopidogrel (PLAVIX) 75 MG tablet Take 1 tablet (75 mg total) by mouth daily. 30 tablet 2  . ferrous fumarate-iron polysaccharide complex (TANDEM) 162-115.2 MG CAPS capsule Take 1 capsule by mouth daily.    [name] FLUoxetine (PROZAC) 20 MG capsule Take 20 mg by mouth at bedtime.    . furosemide (LASIX) 40 MG tablet Take 40 mg by mouth 2 (two) times daily. Pt only takes on Tuesday, Thursday, Saturday, and Sunday.    . hydrALAZINE (APRESOLINE) 25 MG tablet Take 25 mg by mouth 3 (three) times daily.    . insulin glargine (LANTUS) 100 UNIT/ML injection Inject 30 Units into the skin at bedtime.    . isosorbide mononitrate (IMDUR) 60 MG 24 hr tablet Take 1 tablet (60 mg total) by mouth daily. 30 tablet 6  . levothyroxine (SYNTHROID, LEVOTHROID) 75 MCG tablet Take 75 mcg by mouth daily.     [name] lidocaine-prilocaine (EMLA) cream Apply 1 application topically as needed (for pain).    . nystatin (MYCOSTATIN/NYSTOP) 100000 UNIT/GM POWD Apply 1 g topically as needed (for irritation). Reported on 01/28/2016    . rosuvastatin (CRESTOR) 40 MG tablet Take 40 mg by mouth at bedtime.    [name] telmisartan (MICARDIS) 80 MG tablet Take 80 mg by mouth daily.    . traZODone (DESYREL) 50 MG tablet Take 50 mg by mouth at bedtime.     . triamcinolone (KENALOG) 0.5 % cream Apply 1 application topically as needed (for irritation).      No current facility-administered medications on file prior to visit.     There are no Patient Instructions on file for this visit. No Follow-up on file.   Deanza Upperman A Clevie Prout, PA-C

## 2016-10-18 DIAGNOSIS — Z951 Presence of aortocoronary bypass graft: Secondary | ICD-10-CM | POA: Insufficient documentation

## 2016-10-18 DIAGNOSIS — I350 Nonrheumatic aortic (valve) stenosis: Secondary | ICD-10-CM | POA: Insufficient documentation

## 2016-12-28 ENCOUNTER — Encounter (INDEPENDENT_AMBULATORY_CARE_PROVIDER_SITE_OTHER): Payer: Self-pay | Admitting: Vascular Surgery

## 2017-01-10 ENCOUNTER — Encounter (INDEPENDENT_AMBULATORY_CARE_PROVIDER_SITE_OTHER): Payer: Medicare Other

## 2017-01-10 ENCOUNTER — Ambulatory Visit (INDEPENDENT_AMBULATORY_CARE_PROVIDER_SITE_OTHER): Payer: Medicare Other | Admitting: Vascular Surgery

## 2017-03-21 ENCOUNTER — Encounter: Payer: Self-pay | Admitting: Pulmonary Disease

## 2017-03-21 ENCOUNTER — Ambulatory Visit (INDEPENDENT_AMBULATORY_CARE_PROVIDER_SITE_OTHER): Payer: Medicare Other | Admitting: Pulmonary Disease

## 2017-03-21 VITALS — BP 118/58 | HR 68 | Ht 63.0 in | Wt 220.8 lb

## 2017-03-21 DIAGNOSIS — E662 Morbid (severe) obesity with alveolar hypoventilation: Secondary | ICD-10-CM

## 2017-03-21 DIAGNOSIS — J9611 Chronic respiratory failure with hypoxia: Secondary | ICD-10-CM | POA: Diagnosis not present

## 2017-03-21 DIAGNOSIS — G4733 Obstructive sleep apnea (adult) (pediatric): Secondary | ICD-10-CM | POA: Diagnosis not present

## 2017-03-21 NOTE — Patient Instructions (Signed)
Will arrange for overnight oxygen test with you using CPAP >> don't use your oxygen on the night that you do the overnight oxygen test.  Only do the test with you using CPAP for that one night.  Will get copy of your CPAP download  Follow up in 1 year

## 2017-03-21 NOTE — Progress Notes (Signed)
Current Outpatient Prescriptions on File Prior to Visit  Medication Sig  . acetaminophen (TYLENOL) 500 MG tablet Take 1,000 mg by mouth every 6 (six) hours as needed for mild pain.  [name] albuterol (PROVENTIL HFA;VENTOLIN HFA) 108 (90 BASE) MCG/ACT inhaler Inhale 2 puffs into the lungs every 6 (six) hours as needed for wheezing or shortness of breath.  [name] aspirin EC 81 MG tablet Take 81 mg by mouth daily.  . carvedilol (COREG) 25 MG tablet Take 25 mg by mouth 2 (two) times daily.  . Cholecalciferol (VITAMIN D3) 5000 UNITS TABS Take 5,000 Units by mouth daily.  . clopidogrel (PLAVIX) 75 MG tablet Take 1 tablet (75 mg total) by mouth daily.  . ferrous fumarate-iron polysaccharide complex (TANDEM) 162-115.2 MG CAPS capsule Take 1 capsule by mouth daily.  [name] FLUoxetine (PROZAC) 20 MG capsule Take 20 mg by mouth at bedtime.  . furosemide (LASIX) 40 MG tablet Take 40 mg by mouth 2 (two) times daily. Pt only takes on Tuesday, Thursday, Saturday, and Sunday.  . hydrALAZINE (APRESOLINE) 25 MG tablet Take 25 mg by mouth 3 (three) times daily.  . insulin glargine (LANTUS) 100 UNIT/ML injection Inject 30 Units into the skin at bedtime.  . isosorbide mononitrate (IMDUR) 60 MG 24 hr tablet Take 1 tablet (60 mg total) by mouth daily.  [name] levothyroxine (SYNTHROID, LEVOTHROID) 75 MCG tablet Take 75 mcg by mouth daily.   [name] lidocaine-prilocaine (EMLA) cream Apply 1 application topically as needed (for pain).  . nystatin (MYCOSTATIN/NYSTOP) 100000 UNIT/GM POWD Apply 1 g topically as needed (for irritation). Reported on 01/28/2016  . rosuvastatin (CRESTOR) 40 MG tablet Take 40 mg by mouth at bedtime.  [name] telmisartan (MICARDIS) 80 MG tablet Take 80 mg by mouth daily.  . traZODone (DESYREL) 50 MG tablet Take 50 mg by mouth at bedtime.   . triamcinolone (KENALOG) 0.5 % cream Apply 1 application topically as needed (for irritation).    No current facility-administered medications on file prior to visit.     Chief Complaint   Patient presents with  . Follow-up    Pt states that she is here to be approved for O2 with CPAP for another year. DME: [facility] for CPAP and 2 L O2 at night. Pt states that she is becoming weaker and more fatigued; pt is on dialysis.    Sleep tests PSG 01/05/06>>AHI 24.8  Auto CPAP 10/25/13 to 11/05/13 >> Used on 12 of 12 nights with average 10 hrs 46 min. Average AHI 3.3 with mean CPAP 9 cm H2O and 90 th percentile CPAP 12 cm H2O.  Pulmonary tests V/Q scan 01/26/10>>very low probability for PE PFT 02/05/10>>FEV1 1.38(77%), FEV1% 73, TLC 3.78(86%), DLCO 48%, +BD CT chest 05/31/11>>5 mm RUL nodule stable for 2 yrs  Cardiac tests Echo 02/01/15 >> EF 55 to 60%, PAS 43 mmHg  Past medical history CAD, Diastolic CHF, HTN, HLD, ESRD, Hypothyroidism, ACE cough, PAD, TIA, CVA  Past surgical history, Family history, Social history, Allergies reviewed  Vital signs BP (!) 118/58 (BP Location: Other (Comment), Patient Position: Sitting, Cuff Size: Normal) Comment (BP Location): right forearm  Pulse 68   Ht 5\' 3"  (1.6 m)   Wt 220 lb 12.8 oz (100.2 kg)   LMP 02/01/1986 (Approximate)   SpO2 95%   BMI 39.11 kg/m    History of Present Illness: Dawn Bradford is a 81 y.o. female with multifactorial dyspnea, mild/intermittent asthma, OSA/OHS, deconditioning, and diastolic dysfunction.  She uses CPAP and oxygen nightly.  She  isn't having any trouble with her mask fit.  She is in bed for about 6 to 8 hours.  Her sleep gets disrupted due to cramps after she has dialysis.  She will nap during the day, and doesn't always use her CPAP when she naps.  Physical Exam:  General - pleasant Eyes - pupils reactive ENT - no sinus tenderness, no oral exudate, no LAN Cardiac - regular, no murmur Chest - no wheeze, rales Abd - soft, non tender Ext - no edema, AV graft Lt arm Skin - no rashes Neuro - normal strength Psych - normal mood  Assessment/Plan:  Obstructive sleep apnea. - she reports compliance  with CPAP and benefit from therapy - continue auto CPAP - will get copy of her download  Chronic respiratory failure with hypoxia 2nd to obesity-hypoventilation syndrome. - will arrange for ONO with CPAP to requalify her for nocturnal oxygen therapy - continue 2 liters oxygen at night with CPAP for now  Mild, intermittent asthma. - stable - prn albuterol   Patient Instructions  Will arrange for overnight oxygen test with you using CPAP >> don't use your oxygen on the night that you do the overnight oxygen test.  Only do the test with you using CPAP for that one night.  Will get copy of your CPAP download  Follow up in 1 year    Chesley Mires, MD [facility] Pager:  [phone] 03/21/2017, 11:24 AM

## 2017-04-05 ENCOUNTER — Encounter: Payer: Self-pay | Admitting: Pulmonary Disease

## 2017-04-27 ENCOUNTER — Telehealth: Payer: Self-pay | Admitting: Pulmonary Disease

## 2017-04-27 NOTE — Telephone Encounter (Signed)
ONO with CPAP and RA 04/05/17 >> test time 10 hrs 54 min.  Basal SpO2 93%, low SpO2 85%.  Spent 8 min with SpO2 < 88%.   Please let her know that her ONO shows oxygen level is still low if she is only using CPAP at night.  She should continue using CPAP and 2 liters oxygen at night.

## 2017-04-27 NOTE — Telephone Encounter (Signed)
Spoke with pt and notified of results per Dr. Sood. Pt verbalized understanding and denied any questions.  

## 2017-05-23 ENCOUNTER — Ambulatory Visit: Payer: Medicare Other | Admitting: Cardiovascular Disease

## 2017-05-30 ENCOUNTER — Encounter: Payer: Self-pay | Admitting: Nurse Practitioner

## 2017-05-30 ENCOUNTER — Ambulatory Visit (INDEPENDENT_AMBULATORY_CARE_PROVIDER_SITE_OTHER): Payer: Medicare Other | Admitting: Nurse Practitioner

## 2017-05-30 VITALS — BP 140/65 | HR 77 | Ht 63.0 in | Wt 223.5 lb

## 2017-05-30 DIAGNOSIS — I2510 Atherosclerotic heart disease of native coronary artery without angina pectoris: Secondary | ICD-10-CM | POA: Diagnosis not present

## 2017-05-30 DIAGNOSIS — E785 Hyperlipidemia, unspecified: Secondary | ICD-10-CM

## 2017-05-30 DIAGNOSIS — N186 End stage renal disease: Secondary | ICD-10-CM | POA: Diagnosis not present

## 2017-05-30 DIAGNOSIS — I10 Essential (primary) hypertension: Secondary | ICD-10-CM | POA: Diagnosis not present

## 2017-05-30 NOTE — Patient Instructions (Addendum)
Medication Instructions:  Your physician recommends that you continue on your current medications as directed. Please refer to the Current Medication list given to you today.   Labwork: NONE  Testing/Procedures: NONE  Follow-Up: Your physician wants you to follow-up in: [place]. You will receive a reminder letter in the mail two months in advance. If you don't receive a letter, please call our office to schedule the follow-up appointment.   If you need a refill on your cardiac medications before your next appointment, please call your pharmacy.

## 2017-05-30 NOTE — Progress Notes (Signed)
Office Visit    Patient Name: Dawn Bradford Date of Encounter: 05/30/2017  Primary Care Provider:  Hortencia Pilar, MD Primary Cardiologist:  Jerilynn Mages. Fletcher Anon, MD   Chief Complaint    81 year old female with a prior history of CAD status post remote bypass surgery in 1996, hypertension, renal artery stenosis, morbid obesity, diastolic heart failure, sleep apnea, and end-stage renal disease on hemodialysis, who presents for follow-up.  Past Medical History    Past Medical History:  Diagnosis Date  . ACE-inhibitor cough   . Aortic valve disorder    Echo in 2009 showed mean aortic valve gradient of 13 mmHg, suggesting very mild stenosis. Echo (12/10) suggested aortic sclerosis only  . Carotid artery disease (HCC)    mild, carotid dopplers 12/2009  . CHF (congestive heart failure) ([place])   . Coronary artery disease    s/p anterior MI in 1996 followed by CABG. Lexiscan myoview (4/11): EF 67%, normal perfusion with no evidence for ischemia or infarction.   . Diabetes mellitus    type 2  . Diastolic heart failure    most recent echo (12/10) showed EF 55-60% with mild LVH, grade I diastolic dysfunction, mild LAE.  [name] ESRD (end stage renal disease) on dialysis ([place])   . Hyperlipidemia   . Hypertension    resistant hyptertension times many years. the patient does have renal artery stenosis. she has tried calcium channel blockers in the past and states that she would not take them now because she had some problems with her gums which her dentist identified as calcium-channel blocker side effects.  . Hypothyroidism   . Mild asthma    PFT 02/05/10 FEV1 1.38, FEV1% 73, TLC 3.78 (86%), DLCO 48%, +BD  . Myocardial infarction ([place])   . Obesity   . Obesity hypoventilation syndrome ([place])       . Obstructive sleep apnea    PSG 01/05/2006 AHI 24.8, CPAP 9cm H2O  . Pulmonary nodule, right   . Renal artery stenosis [facility])    The patient has an occluded right renal artery and an atrophic right kidney. There is  20% left renal artery stenosis. This was seen by catheterization in 2007  . Stroke [facility])    Past Surgical History:  Procedure Laterality Date  . ABDOMINAL HYSTERECTOMY  1985  . AV FISTULA PLACEMENT    . CATARACT EXTRACTION  2010  . CORONARY ARTERY BYPASS GRAFT  1996  . DIALYSIS FISTULA CREATION    . [place]   right  . PERIPHERAL VASCULAR CATHETERIZATION Left 01/06/2015   Procedure: A/V Shuntogram/Fistulagram;  Surgeon: Katha Cabal, MD;  Location: [place] CV [facility];  Service: Cardiovascular;  Laterality: Left;  . PERIPHERAL VASCULAR CATHETERIZATION N/A 02/17/2015   Procedure: Dialysis/Perma Catheter Removal;  Surgeon: Katha Cabal, MD;  Location: [place] [facility];  Service: Cardiovascular;  Laterality: N/A;  . PERIPHERAL VASCULAR CATHETERIZATION N/A 01/28/2016   Procedure: A/V Shuntogram/Fistulagram;  Surgeon: Algernon Huxley, MD;  Location: [place] [facility];  Service: Cardiovascular;  Laterality: N/A;  . PERIPHERAL VASCULAR CATHETERIZATION N/A 01/28/2016   Procedure: A/V Shunt Intervention;  Surgeon: Algernon Huxley, MD;  Location: [place] [facility];  Service: Cardiovascular;  Laterality: N/A;  . TUBAL LIGATION  1973    Allergies  Allergies  Allergen Reactions  . Ace Inhibitors Other (See Comments)    Reaction:  Unknown   . Amlodipine Other (See Comments)    Reaction:  Unknown   . Clonidine  Hydrochloride Other (See Comments)    Reaction:  Unknown   . Codeine Hives  . Metformin Nausea And Vomiting    History of Present Illness    81 year old female with the above, plus past medical history including coronary artery disease status post CABG in 1996 in [place], hypertension, renal artery stenosis, morbid obesity, diastolic heart failure, aortic sclerosis with systolic murmur, sleep apnea, and end-stage renal disease on Monday, Wednesday, Friday dialysis. She was last seen in clinic in October 2017. Since that times, she says she has done reasonably  well. She is not particularly active but has only had maybe one or 2 episodes of chest discomfort over the past year, lasting just a few minutes and resolving spontaneously. She tolerates hemodialysis well and does not typically experience symptoms. She does have some degree of chronic dyspnea on exertion and says she is simply "lazy." She denies PND, orthopnea, dizziness, syncope, or early satiety. She sometimes notes mild lower extremity swelling prior to dialysis on her dialysis days.  Home Medications    Prior to Admission medications   Medication Sig Start Date End Date Taking? Authorizing Provider  acetaminophen (TYLENOL) 500 MG tablet Take 1,000 mg by mouth every 6 (six) hours as needed for mild pain.   Yes [provider]  albuterol (PROVENTIL HFA;VENTOLIN HFA) 108 (90 BASE) MCG/ACT inhaler Inhale 2 puffs into the lungs every 6 (six) hours as needed for wheezing or shortness of breath.   Yes [provider]  aspirin EC 81 MG tablet Take 81 mg by mouth daily.   Yes [provider]  carvedilol (COREG) 25 MG tablet Take 25 mg by mouth 2 (two) times daily.   Yes [provider]  Cholecalciferol (VITAMIN D3) 5000 UNITS TABS Take 5,000 Units by mouth daily.   Yes [provider]  clopidogrel (PLAVIX) 75 MG tablet Take 1 tablet (75 mg total) by mouth daily. 02/03/15  Yes Theodoro Grist, MD  ferrous fumarate-iron polysaccharide complex (TANDEM) 162-115.2 MG CAPS capsule Take 1 capsule by mouth daily.   Yes [provider]  FLUoxetine (PROZAC) 20 MG capsule Take 20 mg by mouth at bedtime.   Yes [provider]  furosemide (LASIX) 40 MG tablet Pt only takes 80 mg on the mornings of Sunday, Tuesday and Saturday.  Takes 20 mg on Thursday mornings.   Yes Larey Dresser, MD  hydrALAZINE (APRESOLINE) 25 MG tablet Take 25 mg by mouth 3 (three) times daily.   Yes [provider]  insulin glargine (LANTUS) 100 UNIT/ML injection Inject 30  Units into the skin at bedtime.   Yes [provider]  isosorbide mononitrate (IMDUR) 60 MG 24 hr tablet Take 1 tablet (60 mg total) by mouth daily. 09/17/15  Yes Wellington Hampshire, MD  levothyroxine (SYNTHROID, LEVOTHROID) 75 MCG tablet Take 75 mcg by mouth daily.    Yes [provider]  lidocaine-prilocaine (EMLA) cream Apply 1 application topically as needed (for pain).   Yes [provider]  nystatin (MYCOSTATIN/NYSTOP) 100000 UNIT/GM POWD Apply 1 g topically as needed (for irritation). Reported on 01/28/2016   Yes [provider]  rosuvastatin (CRESTOR) 40 MG tablet Take 40 mg by mouth at bedtime.   Yes [provider]  telmisartan (MICARDIS) 80 MG tablet Take 80 mg by mouth daily.   Yes [provider]  traZODone (DESYREL) 50 MG tablet Take 50 mg by mouth at bedtime.    Yes [provider]  triamcinolone (KENALOG) 0.5 % cream  Apply 1 application topically as needed (for irritation).    Yes [provider]    Review of Systems    Some degree of chronic, stable dyspnea on exertion. Only 2 episodes of chest pain in the past year which were both short-lived. She has mild lower extremity swelling and she denies outpatient, PND, orthopnea, dizziness, syncope, or early satiety.  All other systems reviewed and are otherwise negative except as noted above.  Physical Exam    VS:  BP 140/65 (BP Location: Left Arm, Patient Position: Sitting, Cuff Size: Normal)   Pulse 77   Ht 5\' 3"  (1.6 m)   Wt 223 lb 8 oz (101.4 kg)   LMP 02/01/1986 (Approximate)   BMI 39.59 kg/m  , BMI Body mass index is 39.59 kg/m. GEN: Well nourished, well developed, in no acute distress.  HEENT: normal.  Neck: Supple, no JVD, carotid bruits, or masses. Cardiac: RRR, 2/6 systolic ejection murmur at the right upper sternal border, no rubs, or gallops. No clubbing, cyanosis, edema.  Radials/DP/PT 2+ and equal bilaterally. Functioning left forearm AV fistula  with bruit and thrill. Respiratory:  Respirations regular and unlabored, clear to auscultation bilaterally. GI: Soft, nontender, nondistended, BS + x 4. MS: no deformity or atrophy. Skin: warm and dry, no rash. Neuro:  Strength and sensation are intact. Psych: Normal affect.  Accessory Clinical Findings    ECG - Regular sinus rhythm, PACs, LVH.  Assessment & Plan    1.  Coronary artery disease: Status post remote CABG. She has been doing well and tolerating hemodialysis well without significant chest pain. Only 2 episodes in the past year which were both short-lived. She does have some degree of chronic dyspnea on exertion. She is euvolemic on exam today. She remains on aspirin, beta blocker, Plavix, nitrate, ARB, and statin therapy. No changes today.  2. End-stage renal disease: On Monday and Wednesday, Friday dialysis. She seems to be tolerating this well. Next  3. Hypertension: Blood pressure mildly elevated today at 140/65. This apparently runs better during dialysis sessions, frequently in the 120s. No changes today.  4. Hyperlipidemia: LDL was 49 earlier this year. Continue statin therapy.  5. Type 2 diabetes mellitus: She is on Lantus and this is followed by primary care.  6. Disposition: Follow-up in 6 months.   Murray Hodgkins, NP 05/30/2017, 4:18 PM

## 2017-09-14 ENCOUNTER — Encounter: Payer: Self-pay | Admitting: Podiatry

## 2017-09-14 ENCOUNTER — Ambulatory Visit (INDEPENDENT_AMBULATORY_CARE_PROVIDER_SITE_OTHER): Payer: Medicare Other | Admitting: Podiatry

## 2017-09-14 DIAGNOSIS — E1142 Type 2 diabetes mellitus with diabetic polyneuropathy: Secondary | ICD-10-CM

## 2017-09-14 DIAGNOSIS — D689 Coagulation defect, unspecified: Secondary | ICD-10-CM | POA: Diagnosis not present

## 2017-09-14 DIAGNOSIS — M2041 Other hammer toe(s) (acquired), right foot: Secondary | ICD-10-CM | POA: Diagnosis not present

## 2017-09-14 DIAGNOSIS — M2010 Hallux valgus (acquired), unspecified foot: Secondary | ICD-10-CM | POA: Diagnosis not present

## 2017-09-14 NOTE — Progress Notes (Signed)
   Subjective:    Patient ID: Dawn Bradford, female    DOB: 04/03/1936, 82 y.o.   MRN: [id number]  HPI this patient presents the office for an evaluation of her diabetic feet.  She presents the office today concerned about the second toe on the right foot, which has developed a corn next to the big toe.  Patient also states that her nails have grown long, but her daughter trimmed her nails  just last week.  Patient has been diagnosed as having diabetes mellitus type 2, kidney disease and CVA.  [name]  Patient is presently taking plavix.  She presents  to the office today for diabetic foot exam.    Review of Systems  All other systems reviewed and are negative.      Objective:   Physical Exam General Appearance  Alert, conversant and in no acute stress.  Vascular  Dorsalis pedis and posterior pulses are palpable  bilaterally.  Capillary return is within normal limits  bilaterally. Temperature is within normal limits  Bilaterally.  Neurologic  Senn-Weinstein monofilament wire test absent   bilaterally. Muscle power within normal limits bilaterally.  Nails Thick disfigured discolored nails with subungual debris bilaterally from hallux to fifth toes bilaterally. No evidence of bacterial infection or drainage bilaterally.  Orthopedic  No limitations of motion of motion feet bilaterally.  No crepitus or effusions noted.  HAV  B/L.  Hammer toe second right foot.  Skin  normotropic skin with no porokeratosis noted bilaterally.  No signs of infections or ulcers noted. .  Callus noted at the medial aspect DIPJ second toe right foot.        Assessment & Plan:  HAV  Hammer Toe  Diabetes with absent LOPS  Initial exam.  Evaluation of her foot reveals an absent LOPS .  She also has a HAV deformity with hammer toe, right foot.  She has developed a corn on the second toe of the right foot.  Patient was treated with a toe cap  as well as a Gelfoam toe cover.  Patient was instructed to return to the office  within 4 weeks at which time a diabetic footgear can be initiated due to DPN HAV and hammer toe..  RTC preventative foot care services.   Gardiner Barefoot DPM

## 2017-10-11 ENCOUNTER — Encounter: Payer: Self-pay | Admitting: Emergency Medicine

## 2017-10-11 ENCOUNTER — Inpatient Hospital Stay
Admission: EM | Admit: 2017-10-11 | Discharge: 2017-10-15 | DRG: 291 | Disposition: A | Discharge status: Skilled Nursing Facility | Payer: Medicare Other | Attending: Specialist | Admitting: Specialist

## 2017-10-11 ENCOUNTER — Other Ambulatory Visit: Payer: Self-pay

## 2017-10-11 ENCOUNTER — Emergency Department: Payer: Medicare Other

## 2017-10-11 DIAGNOSIS — I5033 Acute on chronic diastolic (congestive) heart failure: Secondary | ICD-10-CM | POA: Diagnosis present

## 2017-10-11 DIAGNOSIS — J810 Acute pulmonary edema: Secondary | ICD-10-CM | POA: Diagnosis not present

## 2017-10-11 DIAGNOSIS — I701 Atherosclerosis of renal artery: Secondary | ICD-10-CM | POA: Diagnosis present

## 2017-10-11 DIAGNOSIS — H6692 Otitis media, unspecified, left ear: Secondary | ICD-10-CM | POA: Diagnosis present

## 2017-10-11 DIAGNOSIS — Z9115 Patient's noncompliance with renal dialysis: Secondary | ICD-10-CM

## 2017-10-11 DIAGNOSIS — Z9989 Dependence on other enabling machines and devices: Secondary | ICD-10-CM

## 2017-10-11 DIAGNOSIS — Z794 Long term (current) use of insulin: Secondary | ICD-10-CM

## 2017-10-11 DIAGNOSIS — Y92003 Bedroom of unspecified non-institutional (private) residence as the place of occurrence of the external cause: Secondary | ICD-10-CM

## 2017-10-11 DIAGNOSIS — N2581 Secondary hyperparathyroidism of renal origin: Secondary | ICD-10-CM | POA: Diagnosis present

## 2017-10-11 DIAGNOSIS — I132 Hypertensive heart and chronic kidney disease with heart failure and with stage 5 chronic kidney disease, or end stage renal disease: Secondary | ICD-10-CM | POA: Diagnosis not present

## 2017-10-11 DIAGNOSIS — Z992 Dependence on renal dialysis: Secondary | ICD-10-CM

## 2017-10-11 DIAGNOSIS — J449 Chronic obstructive pulmonary disease, unspecified: Secondary | ICD-10-CM | POA: Diagnosis present

## 2017-10-11 DIAGNOSIS — N261 Atrophy of kidney (terminal): Secondary | ICD-10-CM | POA: Diagnosis present

## 2017-10-11 DIAGNOSIS — Z885 Allergy status to narcotic agent status: Secondary | ICD-10-CM

## 2017-10-11 DIAGNOSIS — E662 Morbid (severe) obesity with alveolar hypoventilation: Secondary | ICD-10-CM | POA: Diagnosis present

## 2017-10-11 DIAGNOSIS — Z792 Long term (current) use of antibiotics: Secondary | ICD-10-CM

## 2017-10-11 DIAGNOSIS — E1122 Type 2 diabetes mellitus with diabetic chronic kidney disease: Secondary | ICD-10-CM | POA: Diagnosis present

## 2017-10-11 DIAGNOSIS — Z888 Allergy status to other drugs, medicaments and biological substances status: Secondary | ICD-10-CM

## 2017-10-11 DIAGNOSIS — Z951 Presence of aortocoronary bypass graft: Secondary | ICD-10-CM

## 2017-10-11 DIAGNOSIS — E1169 Type 2 diabetes mellitus with other specified complication: Secondary | ICD-10-CM

## 2017-10-11 DIAGNOSIS — I2510 Atherosclerotic heart disease of native coronary artery without angina pectoris: Secondary | ICD-10-CM | POA: Diagnosis present

## 2017-10-11 DIAGNOSIS — J9601 Acute respiratory failure with hypoxia: Secondary | ICD-10-CM | POA: Diagnosis present

## 2017-10-11 DIAGNOSIS — I6529 Occlusion and stenosis of unspecified carotid artery: Secondary | ICD-10-CM | POA: Diagnosis present

## 2017-10-11 DIAGNOSIS — E785 Hyperlipidemia, unspecified: Secondary | ICD-10-CM | POA: Diagnosis present

## 2017-10-11 DIAGNOSIS — Z79899 Other long term (current) drug therapy: Secondary | ICD-10-CM

## 2017-10-11 DIAGNOSIS — N186 End stage renal disease: Secondary | ICD-10-CM | POA: Diagnosis present

## 2017-10-11 DIAGNOSIS — H9192 Unspecified hearing loss, left ear: Secondary | ICD-10-CM | POA: Diagnosis present

## 2017-10-11 DIAGNOSIS — Z6841 Body Mass Index (BMI) 40.0 and over, adult: Secondary | ICD-10-CM

## 2017-10-11 DIAGNOSIS — E039 Hypothyroidism, unspecified: Secondary | ICD-10-CM | POA: Diagnosis present

## 2017-10-11 DIAGNOSIS — E669 Obesity, unspecified: Secondary | ICD-10-CM

## 2017-10-11 DIAGNOSIS — R0902 Hypoxemia: Secondary | ICD-10-CM | POA: Diagnosis present

## 2017-10-11 DIAGNOSIS — D631 Anemia in chronic kidney disease: Secondary | ICD-10-CM | POA: Diagnosis present

## 2017-10-11 DIAGNOSIS — Z7902 Long term (current) use of antithrombotics/antiplatelets: Secondary | ICD-10-CM

## 2017-10-11 DIAGNOSIS — Z8673 Personal history of transient ischemic attack (TIA), and cerebral infarction without residual deficits: Secondary | ICD-10-CM

## 2017-10-11 DIAGNOSIS — Z7982 Long term (current) use of aspirin: Secondary | ICD-10-CM

## 2017-10-11 DIAGNOSIS — F329 Major depressive disorder, single episode, unspecified: Secondary | ICD-10-CM | POA: Diagnosis present

## 2017-10-11 DIAGNOSIS — R0602 Shortness of breath: Secondary | ICD-10-CM

## 2017-10-11 DIAGNOSIS — W06XXXA Fall from bed, initial encounter: Secondary | ICD-10-CM | POA: Diagnosis present

## 2017-10-11 DIAGNOSIS — I252 Old myocardial infarction: Secondary | ICD-10-CM

## 2017-10-11 DIAGNOSIS — I359 Nonrheumatic aortic valve disorder, unspecified: Secondary | ICD-10-CM | POA: Diagnosis present

## 2017-10-11 DIAGNOSIS — J189 Pneumonia, unspecified organism: Secondary | ICD-10-CM

## 2017-10-11 HISTORY — DX: Chronic obstructive pulmonary disease, unspecified: J44.9

## 2017-10-11 LAB — COMPREHENSIVE METABOLIC PANEL
ALK PHOS: 73 U/L (ref 38–126)
ALT: 17 U/L (ref 14–54)
AST: 36 U/L (ref 15–41)
Albumin: 3.3 g/dL — ABNORMAL LOW (ref 3.5–5.0)
Anion gap: 11 (ref 5–15)
BUN: 26 mg/dL — AB (ref 6–20)
CALCIUM: 8.4 mg/dL — AB (ref 8.9–10.3)
CHLORIDE: 96 mmol/L — AB (ref 101–111)
CO2: 24 mmol/L (ref 22–32)
CREATININE: 2.45 mg/dL — AB (ref 0.44–1.00)
GFR, EST AFRICAN AMERICAN: 20 mL/min — AB (ref >60)
GFR, EST NON AFRICAN AMERICAN: 18 mL/min — AB (ref >60)
Glucose, Bld: 130 mg/dL — ABNORMAL HIGH (ref 65–99)
Potassium: 3.6 mmol/L (ref 3.5–5.1)
SODIUM: 131 mmol/L — AB (ref 135–145)
Total Bilirubin: 1 mg/dL (ref 0.3–1.2)
Total Protein: 6.4 g/dL — ABNORMAL LOW (ref 6.5–8.1)

## 2017-10-11 LAB — CBC
HCT: 30.7 % — ABNORMAL LOW (ref 35.0–47.0)
Hemoglobin: 10.5 g/dL — ABNORMAL LOW (ref 12.0–16.0)
MCH: 35.6 pg — AB (ref 26.0–34.0)
MCHC: 34.3 g/dL (ref 32.0–36.0)
MCV: 103.7 fL — AB (ref 80.0–100.0)
PLATELETS: 128 10*3/uL — AB (ref 150–440)
RBC: 2.96 MIL/uL — AB (ref 3.80–5.20)
RDW: 13.9 % (ref 11.5–14.5)
WBC: 10.7 10*3/uL (ref 3.6–11.0)

## 2017-10-11 LAB — TROPONIN I: Troponin I: <0.03 ng/mL (ref <0.03)

## 2017-10-11 MED ORDER — FUROSEMIDE 10 MG/ML IJ SOLN
40.0000 mg | Freq: Once | INTRAMUSCULAR | Status: AC
  Administered 2017-10-11: 40 mg via INTRAVENOUS
  Filled 2017-10-11: qty 4

## 2017-10-11 MED ORDER — IPRATROPIUM-ALBUTEROL 0.5-2.5 (3) MG/3ML IN SOLN
3.0000 mL | Freq: Once | RESPIRATORY_TRACT | Status: AC
  Administered 2017-10-11: 3 mL via RESPIRATORY_TRACT
  Filled 2017-10-11: qty 3

## 2017-10-11 NOTE — ED Provider Notes (Signed)
[facility] Emergency Department Provider Note    First MD Initiated Contact with Patient 10/11/17 2302     (approximate)  I have reviewed the triage vital signs and the nursing notes.   HISTORY  Chief Complaint Shortness of Breath    HPI Dawn Bradford is a 82 y.o. female with below list of chronic medical conditions including congestive heart failure and end-stage renal disease with dialysis Monday accidental fall tonight.  Patient states that she slid off the bed falling onto her buttocks no head injury.  Patient does however admit to progressive dyspnea over the past few days for which she saw her primary care doctor yesterday and was diagnosed with a left ear infection with concern for possible as well.  Patient was prescribed azithromycin by the primary care provider which she has had 2 doses.   Past Medical History:  Diagnosis Date  . ACE-inhibitor cough   . Aortic valve disorder    Echo in 2009 showed mean aortic valve gradient of 13 mmHg, suggesting very mild stenosis. Echo (12/10) suggested aortic sclerosis only  . Carotid artery disease (HCC)    mild, carotid dopplers 12/2009  . CHF (congestive heart failure) ([place])   . COPD (chronic obstructive pulmonary disease) ([place])   . Coronary artery disease    s/p anterior MI in 1996 followed by CABG. Lexiscan myoview (4/11): EF 67%, normal perfusion with no evidence for ischemia or infarction.   . Diabetes mellitus    type 2  . Diastolic heart failure    most recent echo (12/10) showed EF 55-60% with mild LVH, grade I diastolic dysfunction, mild LAE.  [name] ESRD (end stage renal disease) on dialysis ([place])   . Hyperlipidemia   . Hypertension    resistant hyptertension times many years. the patient does have renal artery stenosis. she has tried calcium channel blockers in the past and states that she would not take them now because she had some problems with her gums which her dentist identified as  calcium-channel blocker side effects.  . Hypothyroidism   . Mild asthma    PFT 02/05/10 FEV1 1.38, FEV1% 73, TLC 3.78 (86%), DLCO 48%, +BD  . Myocardial infarction ([place])   . Obesity   . Obesity hypoventilation syndrome ([place])       . Obstructive sleep apnea    PSG 01/05/2006 AHI 24.8, CPAP 9cm H2O  . Pulmonary nodule, right   . Renal artery stenosis [facility])    The patient has an occluded right renal artery and an atrophic right kidney. There is 20% left renal artery stenosis. This was seen by catheterization in 2007  . Stroke [facility])     Patient Active Problem List   Diagnosis Date Noted  . ESRD on dialysis ([place]) 07/07/2016  . Chest pain 08/19/2015  . Coronary artery disease involving native heart with angina pectoris ([place]) 08/18/2015  . Expressive aphasia 02/03/2015  . Carotid stenosis 02/03/2015  . CVA (cerebral infarction) 01/31/2015  . Diabetes mellitus type 2 in obese ([place]) 01/31/2015  . End stage renal disease ([place]) 01/31/2015  . Mild intermittent asthma 03/29/2010  . CAD, ARTERY BYPASS GRAFT 03/02/2010  . Obesity hypoventilation syndrome ([place]) 02/05/2010  . Shortness of breath 12/24/2009  . CAROTID BRUIT 12/14/2009  . DIASTOLIC HEART FAILURE, CHRONIC 02/17/2009  . DIABETES MELLITUS, TYPE II 01/29/2009  . Hyperlipidemia 01/29/2009  . Obstructive sleep apnea 01/29/2009  . Essential hypertension 01/29/2009  . Aortic valve disorder 01/29/2009  . RENAL ARTERY STENOSIS  01/29/2009  . RENAL DISEASE, CHRONIC 01/29/2009    Past Surgical History:  Procedure Laterality Date  . ABDOMINAL HYSTERECTOMY  1985  . AV FISTULA PLACEMENT    . CATARACT EXTRACTION  2010  . CORONARY ARTERY BYPASS GRAFT  1996  . DIALYSIS FISTULA CREATION    . [place]   right  . PERIPHERAL VASCULAR CATHETERIZATION Left 01/06/2015   Procedure: A/V Shuntogram/Fistulagram;  Surgeon: Katha Cabal, MD;  Location: [place] [facility];  Service: Cardiovascular;  Laterality: Left;  . PERIPHERAL  VASCULAR CATHETERIZATION N/A 02/17/2015   Procedure: Dialysis/Perma Catheter Removal;  Surgeon: Katha Cabal, MD;  Location: [place] [facility] LAB;  Service: Cardiovascular;  Laterality: N/A;  . PERIPHERAL VASCULAR CATHETERIZATION N/A 01/28/2016   Procedure: A/V Shuntogram/Fistulagram;  Surgeon: Algernon Huxley, MD;  Location: [place] [facility] LAB;  Service: Cardiovascular;  Laterality: N/A;  . PERIPHERAL VASCULAR CATHETERIZATION N/A 01/28/2016   Procedure: A/V Shunt Intervention;  Surgeon: Algernon Huxley, MD;  Location: [place] [facility] LAB;  Service: Cardiovascular;  Laterality: N/A;  . TUBAL LIGATION  1973    Prior to Admission medications   Medication Sig Start Date End Date Taking? Authorizing Provider  acetaminophen (TYLENOL) 500 MG tablet Take 1,000 mg by mouth every 6 (six) hours as needed for mild pain.    [provider]  albuterol (PROVENTIL HFA;VENTOLIN HFA) 108 (90 BASE) MCG/ACT inhaler Inhale 2 puffs into the lungs every 6 (six) hours as needed for wheezing or shortness of breath.    [provider]  aspirin EC 81 MG tablet Take 81 mg by mouth daily.    [provider]  carvedilol (COREG) 25 MG tablet Take 25 mg by mouth 2 (two) times daily.    [provider]  Cholecalciferol (VITAMIN D3) 5000 UNITS TABS Take 5,000 Units by mouth daily.    [provider]  clopidogrel (PLAVIX) 75 MG tablet Take 1 tablet (75 mg total) by mouth daily. 02/03/15   Theodoro Grist, MD  ferrous fumarate-iron polysaccharide complex (TANDEM) 162-115.2 MG CAPS capsule Take 1 capsule by mouth daily.    [provider]  FLUoxetine (PROZAC) 20 MG tablet  08/25/17   [provider]  furosemide (LASIX) 40 MG tablet Take by mouth. 07/11/17   [provider]  gabapentin (NEURONTIN) 100 MG capsule  07/10/17   [provider]  hydrALAZINE (APRESOLINE) 50 MG tablet  07/10/17   [provider]  insulin glargine (LANTUS) 100 UNIT/ML  injection Inject 30 Units into the skin at bedtime.    [provider]  Insulin Pen Needle (FIFTY50 PEN NEEDLES) 32G X 6 MM MISC Use as directed. 05/02/17 05/02/18  [provider]  IRON PO Take by mouth.    [provider]  isosorbide mononitrate (IMDUR) 30 MG 24 hr tablet  07/10/17   [provider]  lactulose (CHRONULAC) 10 GM/15ML solution Place rectally.    [provider]  levothyroxine (SYNTHROID, LEVOTHROID) 75 MCG tablet Take 75 mcg by mouth daily.     [provider]  lidocaine-prilocaine (EMLA) cream Apply 1 application topically as needed (for pain).    [provider]  nystatin (MYCOSTATIN/NYSTOP) 100000 UNIT/GM POWD Apply 1 g topically as needed (for irritation). Reported on 01/28/2016    [provider]  rosuvastatin (CRESTOR) 40 MG tablet Take by mouth. 07/11/17   [provider]  triamcinolone (KENALOG) 0.5 % cream Apply 1 application topically as needed (for irritation).  [provider]    Allergies Ace inhibitors; Amlodipine; Clonidine hydrochloride; Codeine; and Metformin  Family History  Problem Relation Age of Onset  . Kidney failure Mother   . Diabetes Mother   . Aortic aneurysm Father   . Coronary artery disease Father   . Heart attack Father   . Diabetes Sister   . Diabetes Brother   . Kidney failure Brother     Social History Social History   Tobacco Use  . Smoking status: Never Smoker  . Smokeless tobacco: Never Used  Substance Use Topics  . Alcohol use: No  . Drug use: No    Review of Systems Constitutional: No fever/chills Eyes: No visual changes. ENT: No sore throat. Cardiovascular: Denies chest pain. Respiratory: Positive for shortness of breath.  Positive for cough Gastrointestinal: No abdominal pain.  No nausea, no vomiting.  No diarrhea.  No constipation. Genitourinary: Negative for dysuria. Musculoskeletal: Negative for neck pain.  Negative for back  pain. Integumentary: Negative for rash. Neurological: Negative for headaches, focal weakness or numbness.   ____________________________________________   PHYSICAL EXAM:  VITAL SIGNS: ED Triage Vitals  Enc Vitals Group     BP 10/11/17 2200 (!) 155/61     Pulse Rate 10/11/17 2200 77     Resp 10/11/17 2201 19     Temp 10/11/17 2201 99.2 F (37.3 C)     Temp Source 10/11/17 2201 Oral     SpO2 10/11/17 2154 (!) 86 %     Weight 10/11/17 2203 97.5 kg (215 lb)     Height 10/11/17 2203 1.651 m (5\' 5" )     Head Circumference --      Peak Flow --      Pain Score --      Pain Loc --      Pain Edu? --      Excl. in [place]? --     Constitutional: Alert and oriented. Well appearing and in no acute distress. Eyes: Conjunctivae are normal.  Head: Atraumatic. Mouth/Throat: Mucous membranes are moist.  Oropharynx non-erythematous. Neck: No stridor.  Cardiovascular: Normal rate, regular rhythm. Good peripheral circulation. Grossly normal heart sounds. Respiratory: Increased respiratory effort, bibasilar rales tachypnea.   Gastrointestinal: Soft and nontender. No distention.  Musculoskeletal: No lower extremity tenderness nor edema. No gross deformities of extremities. Neurologic:  Normal speech and language. No gross focal neurologic deficits are appreciated.  Skin:  Skin is warm, dry and intact. No rash noted. Psychiatric: Mood and affect are normal. Speech and behavior are normal.  ____________________________________________   LABS (all labs ordered are listed, but only abnormal results are displayed)  Labs Reviewed - No data to display ____________________________________________  EKG  ED ECG REPORT I, [place], the attending physician, personally viewed and interpreted this ECG.   Date: 10/12/2017  EKG Time: 10:41 PM  Rate: 72  Rhythm: Normal sinus rhythm  Axis: Normal  Intervals: Normal  ST&T Change:  None  ____________________________________________  RADIOLOGY I, [place] N Ajit Errico, personally viewed and evaluated these images (plain radiographs) as part of my medical decision making, as well as reviewing the written report by the radiologist.  ED MD interpretation: Chest x-ray consistent with an increased interstitial markings vascular congestion suspicious for interstitial edema  Official radiology report(s): Dg Chest 2 View  Result Date: 10/11/2017 CLINICAL DATA:  Acute onset of shortness of breath and congestion. EXAM: CHEST  2 VIEW COMPARISON:  Chest radiograph performed 08/19/2015 FINDINGS: The lungs are mildly hypoexpanded. Mild vascular crowding and  vascular congestion are noted. Mildly increased interstitial markings may reflect minimal interstitial edema. There is no evidence of pleural effusion or pneumothorax. The heart is mildly enlarged. The patient is status post median sternotomy. No acute osseous abnormalities are seen. IMPRESSION: Lungs mildly hypoexpanded. Vascular congestion and mild cardiomegaly. Increased interstitial markings may reflect minimal interstitial edema. Electronically Signed   By: Garald Balding M.D.   On: 10/11/2017 22:59      Procedures   ____________________________________________   INITIAL IMPRESSION / ASSESSMENT AND PLAN / ED COURSE  As part of my medical decision making, I reviewed the following data within the electronic MEDICAL RECORD [id number] year old presenting with above-stated history and physical exam.  Concern for possible pneumonia versus pulmonary edema versus influenza. influenza swab negative chest x-ray consistent with interstitial edema.  Patient's edema most likely secondary to missed dialysis on Monday.  Patient given Lasix 40 mg in the emergency department as well as a DuoNeb.  On reevaluation patient continues to admit to rest tori difficulty oxygen saturation 90% on room air.  As such patient discussed with Dr. Marcille Blanco for hospital  admission for further evaluation and management for pulmonary edema    ____________________________________________  FINAL CLINICAL IMPRESSION(S) / ED DIAGNOSES  Final diagnoses:  Acute pulmonary edema ([place])     MEDICATIONS GIVEN DURING THIS VISIT:  Medications - No data to display   ED Discharge Orders    None       Note:  This document was prepared using Dragon voice recognition software and may include unintentional dictation errors.    Gregor Hams, MD 10/12/17 [phone]

## 2017-10-11 NOTE — ED Triage Notes (Signed)
Pt bib [facility] d/t increased congestion and SHOB. Pt currently being tx for ear infection, antibiotics x3days. Pt wears O2 at baseline. Hx COPD, dialysis pt

## 2017-10-12 DIAGNOSIS — H6692 Otitis media, unspecified, left ear: Secondary | ICD-10-CM | POA: Diagnosis present

## 2017-10-12 DIAGNOSIS — E1122 Type 2 diabetes mellitus with diabetic chronic kidney disease: Secondary | ICD-10-CM | POA: Diagnosis present

## 2017-10-12 DIAGNOSIS — N261 Atrophy of kidney (terminal): Secondary | ICD-10-CM | POA: Diagnosis present

## 2017-10-12 DIAGNOSIS — I701 Atherosclerosis of renal artery: Secondary | ICD-10-CM | POA: Diagnosis present

## 2017-10-12 DIAGNOSIS — Z7902 Long term (current) use of antithrombotics/antiplatelets: Secondary | ICD-10-CM | POA: Diagnosis not present

## 2017-10-12 DIAGNOSIS — J810 Acute pulmonary edema: Secondary | ICD-10-CM | POA: Diagnosis present

## 2017-10-12 DIAGNOSIS — Y92003 Bedroom of unspecified non-institutional (private) residence as the place of occurrence of the external cause: Secondary | ICD-10-CM | POA: Diagnosis not present

## 2017-10-12 DIAGNOSIS — E039 Hypothyroidism, unspecified: Secondary | ICD-10-CM | POA: Diagnosis present

## 2017-10-12 DIAGNOSIS — I252 Old myocardial infarction: Secondary | ICD-10-CM | POA: Diagnosis not present

## 2017-10-12 DIAGNOSIS — I359 Nonrheumatic aortic valve disorder, unspecified: Secondary | ICD-10-CM | POA: Diagnosis present

## 2017-10-12 DIAGNOSIS — D631 Anemia in chronic kidney disease: Secondary | ICD-10-CM | POA: Diagnosis present

## 2017-10-12 DIAGNOSIS — N186 End stage renal disease: Secondary | ICD-10-CM | POA: Diagnosis present

## 2017-10-12 DIAGNOSIS — N2581 Secondary hyperparathyroidism of renal origin: Secondary | ICD-10-CM | POA: Diagnosis present

## 2017-10-12 DIAGNOSIS — E785 Hyperlipidemia, unspecified: Secondary | ICD-10-CM | POA: Diagnosis present

## 2017-10-12 DIAGNOSIS — H9192 Unspecified hearing loss, left ear: Secondary | ICD-10-CM | POA: Diagnosis present

## 2017-10-12 DIAGNOSIS — Z9989 Dependence on other enabling machines and devices: Secondary | ICD-10-CM | POA: Diagnosis not present

## 2017-10-12 DIAGNOSIS — I6529 Occlusion and stenosis of unspecified carotid artery: Secondary | ICD-10-CM | POA: Diagnosis present

## 2017-10-12 DIAGNOSIS — J9601 Acute respiratory failure with hypoxia: Secondary | ICD-10-CM | POA: Diagnosis present

## 2017-10-12 DIAGNOSIS — Z6841 Body Mass Index (BMI) 40.0 and over, adult: Secondary | ICD-10-CM | POA: Diagnosis not present

## 2017-10-12 DIAGNOSIS — I132 Hypertensive heart and chronic kidney disease with heart failure and with stage 5 chronic kidney disease, or end stage renal disease: Secondary | ICD-10-CM | POA: Diagnosis present

## 2017-10-12 DIAGNOSIS — R0902 Hypoxemia: Secondary | ICD-10-CM | POA: Diagnosis present

## 2017-10-12 DIAGNOSIS — F329 Major depressive disorder, single episode, unspecified: Secondary | ICD-10-CM | POA: Diagnosis present

## 2017-10-12 DIAGNOSIS — I2510 Atherosclerotic heart disease of native coronary artery without angina pectoris: Secondary | ICD-10-CM | POA: Diagnosis present

## 2017-10-12 DIAGNOSIS — J449 Chronic obstructive pulmonary disease, unspecified: Secondary | ICD-10-CM | POA: Diagnosis present

## 2017-10-12 DIAGNOSIS — I5033 Acute on chronic diastolic (congestive) heart failure: Secondary | ICD-10-CM | POA: Diagnosis present

## 2017-10-12 DIAGNOSIS — Z7982 Long term (current) use of aspirin: Secondary | ICD-10-CM | POA: Diagnosis not present

## 2017-10-12 DIAGNOSIS — E662 Morbid (severe) obesity with alveolar hypoventilation: Secondary | ICD-10-CM | POA: Diagnosis present

## 2017-10-12 DIAGNOSIS — W06XXXA Fall from bed, initial encounter: Secondary | ICD-10-CM | POA: Diagnosis present

## 2017-10-12 LAB — INFLUENZA PANEL BY PCR (TYPE A & B)
INFLAPCR: NEGATIVE
INFLBPCR: NEGATIVE

## 2017-10-12 LAB — GLUCOSE, CAPILLARY
Glucose-Capillary: 116 mg/dL — ABNORMAL HIGH (ref 65–99)
Glucose-Capillary: 91 mg/dL (ref 65–99)

## 2017-10-12 LAB — TSH: TSH: 2.163 u[IU]/mL (ref 0.350–4.500)

## 2017-10-12 MED ORDER — GABAPENTIN 100 MG PO CAPS: 100.0000 mg | ORAL_CAPSULE | Freq: Every day | ORAL | Status: DC

## 2017-10-12 MED ORDER — HYDRALAZINE HCL 50 MG PO TABS
50.0000 mg | ORAL_TABLET | Freq: Two times a day (BID) | ORAL | Status: DC
  Administered 2017-10-12 – 2017-10-15 (×5): 50 mg via ORAL
  Filled 2017-10-12 (×5): qty 1

## 2017-10-12 MED ORDER — INSULIN GLARGINE 100 UNIT/ML ~~LOC~~ SOLN
18.0000 [IU] | Freq: Every day | SUBCUTANEOUS | Status: DC
  Administered 2017-10-12 – 2017-10-14 (×3): 18 [IU] via SUBCUTANEOUS
  Filled 2017-10-12 (×4): qty 0.18

## 2017-10-12 MED ORDER — ACETAMINOPHEN 325 MG PO TABS: 650.0000 mg | ORAL_TABLET | Freq: Four times a day (QID) | ORAL | Status: DC | PRN

## 2017-10-12 MED ORDER — NYSTATIN 100000 UNIT/GM EX POWD
1.0000 g | CUTANEOUS | Status: DC | PRN
  Filled 2017-10-12: qty 15

## 2017-10-12 MED ORDER — IRBESARTAN 150 MG PO TABS
300.0000 mg | ORAL_TABLET | Freq: Every day | ORAL | Status: DC
  Administered 2017-10-12 – 2017-10-15 (×3): 300 mg via ORAL
  Filled 2017-10-12 (×3): qty 2

## 2017-10-12 MED ORDER — CLOPIDOGREL BISULFATE 75 MG PO TABS
75.0000 mg | ORAL_TABLET | Freq: Every day | ORAL | Status: DC
  Administered 2017-10-12 – 2017-10-15 (×4): 75 mg via ORAL
  Filled 2017-10-12 (×4): qty 1

## 2017-10-12 MED ORDER — CARVEDILOL 25 MG PO TABS
25.0000 mg | ORAL_TABLET | ORAL | Status: DC
  Administered 2017-10-13: 25 mg via ORAL

## 2017-10-12 MED ORDER — DOCUSATE SODIUM 100 MG PO CAPS
100.0000 mg | ORAL_CAPSULE | Freq: Two times a day (BID) | ORAL | Status: DC
  Administered 2017-10-12 – 2017-10-15 (×5): 100 mg via ORAL
  Filled 2017-10-12 (×5): qty 1

## 2017-10-12 MED ORDER — FLUOXETINE HCL 20 MG PO CAPS
20.0000 mg | ORAL_CAPSULE | Freq: Every day | ORAL | Status: DC
  Administered 2017-10-12 – 2017-10-15 (×3): 20 mg via ORAL
  Filled 2017-10-12 (×4): qty 1

## 2017-10-12 MED ORDER — FERROUS FUMARATE 324 (106 FE) MG PO TABS
1.0000 | ORAL_TABLET | Freq: Every day | ORAL | Status: DC
  Administered 2017-10-12: 106 mg via ORAL
  Filled 2017-10-12 (×3): qty 1

## 2017-10-12 MED ORDER — ASPIRIN EC 81 MG PO TBEC
81.0000 mg | DELAYED_RELEASE_TABLET | Freq: Every day | ORAL | Status: DC
  Administered 2017-10-12 – 2017-10-15 (×4): 81 mg via ORAL
  Filled 2017-10-12 (×4): qty 1

## 2017-10-12 MED ORDER — BENZONATATE 100 MG PO CAPS
200.0000 mg | ORAL_CAPSULE | Freq: Three times a day (TID) | ORAL | Status: DC
  Administered 2017-10-12 – 2017-10-15 (×9): 200 mg via ORAL
  Filled 2017-10-12 (×9): qty 2

## 2017-10-12 MED ORDER — ACETAMINOPHEN 650 MG RE SUPP: 650.0000 mg | Freq: Four times a day (QID) | RECTAL | Status: DC | PRN

## 2017-10-12 MED ORDER — CARVEDILOL 25 MG PO TABS
25.0000 mg | ORAL_TABLET | ORAL | Status: DC
  Administered 2017-10-12 – 2017-10-15 (×4): 25 mg via ORAL
  Filled 2017-10-12 (×6): qty 1

## 2017-10-12 MED ORDER — ISOSORBIDE MONONITRATE ER 30 MG PO TB24
30.0000 mg | ORAL_TABLET | Freq: Every day | ORAL | Status: DC
  Administered 2017-10-12 – 2017-10-14 (×3): 30 mg via ORAL
  Filled 2017-10-12 (×3): qty 1

## 2017-10-12 MED ORDER — FUROSEMIDE 20 MG PO TABS
20.0000 mg | ORAL_TABLET | ORAL | Status: DC
Start: 2017-10-12 — End: 2017-10-12

## 2017-10-12 MED ORDER — AZITHROMYCIN 250 MG PO TABS
250.0000 mg | ORAL_TABLET | Freq: Every day | ORAL | Status: DC
  Administered 2017-10-12 – 2017-10-15 (×4): 250 mg via ORAL
  Filled 2017-10-12 (×4): qty 1

## 2017-10-12 MED ORDER — CARVEDILOL 25 MG PO TABS: 25.0000 mg | ORAL_TABLET | Freq: Two times a day (BID) | ORAL | Status: DC

## 2017-10-12 MED ORDER — HEPARIN SODIUM (PORCINE) 5000 UNIT/ML IJ SOLN
5000.0000 [IU] | Freq: Three times a day (TID) | INTRAMUSCULAR | Status: DC
  Administered 2017-10-12 – 2017-10-15 (×9): 5000 [IU] via SUBCUTANEOUS
  Filled 2017-10-12 (×9): qty 1

## 2017-10-12 MED ORDER — TRIAMCINOLONE ACETONIDE 0.5 % EX CREA
1.0000 "application " | TOPICAL_CREAM | CUTANEOUS | Status: DC | PRN
  Filled 2017-10-12: qty 15

## 2017-10-12 MED ORDER — ONDANSETRON HCL 4 MG/2ML IJ SOLN: 4.0000 mg | Freq: Four times a day (QID) | INTRAMUSCULAR | Status: DC | PRN

## 2017-10-12 MED ORDER — INSULIN ASPART 100 UNIT/ML ~~LOC~~ SOLN
0.0000 [IU] | Freq: Three times a day (TID) | SUBCUTANEOUS | Status: DC
  Administered 2017-10-14 – 2017-10-15 (×3): 1 [IU] via SUBCUTANEOUS
  Filled 2017-10-12 (×3): qty 1

## 2017-10-12 MED ORDER — FUROSEMIDE 10 MG/ML IJ SOLN
20.0000 mg | Freq: Once | INTRAMUSCULAR | Status: AC
  Administered 2017-10-12: 20 mg via INTRAVENOUS
  Filled 2017-10-12: qty 4

## 2017-10-12 MED ORDER — GUAIFENESIN-DM 100-10 MG/5ML PO SYRP
5.0000 mL | ORAL_SOLUTION | ORAL | Status: DC | PRN
  Administered 2017-10-12 – 2017-10-15 (×8): 5 mL via ORAL
  Filled 2017-10-12 (×8): qty 5

## 2017-10-12 MED ORDER — FERROUS FUM-IRON POLYSACCH 162-115.2 MG PO CAPS: 1.0000 | ORAL_CAPSULE | Freq: Every day | ORAL | Status: DC

## 2017-10-12 MED ORDER — TRAZODONE HCL 50 MG PO TABS
50.0000 mg | ORAL_TABLET | Freq: Every day | ORAL | Status: DC
  Administered 2017-10-12 – 2017-10-14 (×3): 50 mg via ORAL
  Filled 2017-10-12 (×3): qty 1

## 2017-10-12 MED ORDER — RENA-VITE PO TABS
1.0000 | ORAL_TABLET | Freq: Every day | ORAL | Status: DC
  Administered 2017-10-12 – 2017-10-14 (×3): 1 via ORAL
  Filled 2017-10-12 (×3): qty 1

## 2017-10-12 MED ORDER — IPRATROPIUM-ALBUTEROL 0.5-2.5 (3) MG/3ML IN SOLN
3.0000 mL | Freq: Four times a day (QID) | RESPIRATORY_TRACT | Status: DC | PRN
  Administered 2017-10-12 – 2017-10-14 (×2): 3 mL via RESPIRATORY_TRACT
  Filled 2017-10-12 (×3): qty 3

## 2017-10-12 MED ORDER — ROSUVASTATIN CALCIUM 10 MG PO TABS
40.0000 mg | ORAL_TABLET | Freq: Every evening | ORAL | Status: DC
  Administered 2017-10-12 – 2017-10-14 (×3): 40 mg via ORAL
  Filled 2017-10-12 (×3): qty 4

## 2017-10-12 MED ORDER — ONDANSETRON HCL 4 MG PO TABS: 4.0000 mg | ORAL_TABLET | Freq: Four times a day (QID) | ORAL | Status: DC | PRN

## 2017-10-12 MED ORDER — VITAMIN D 1000 UNITS PO TABS
5000.0000 [IU] | ORAL_TABLET | Freq: Every day | ORAL | Status: DC
  Administered 2017-10-12 – 2017-10-14 (×2): 5000 [IU] via ORAL
  Filled 2017-10-12 (×2): qty 5

## 2017-10-12 MED ORDER — FUROSEMIDE 20 MG PO TABS: 20.0000 mg | ORAL_TABLET | ORAL | Status: DC

## 2017-10-12 MED ORDER — LEVOTHYROXINE SODIUM 50 MCG PO TABS
75.0000 ug | ORAL_TABLET | Freq: Every day | ORAL | Status: DC
  Administered 2017-10-12 – 2017-10-15 (×4): 75 ug via ORAL
  Filled 2017-10-12 (×4): qty 2

## 2017-10-12 MED ORDER — FUROSEMIDE 40 MG PO TABS
80.0000 mg | ORAL_TABLET | ORAL | Status: DC
  Administered 2017-10-14: 80 mg via ORAL
  Filled 2017-10-12: qty 2

## 2017-10-12 NOTE — Progress Notes (Signed)
[facility], [place] 10/12/17  Subjective:   Patient is known to our practice from outpatient dialysis She states that she was feeling sick and had some shortness of breath and went to see her primary care doctor.  Missed her dialysis on Monday.  She went to dialysis yesterday where 1 kg fluid was removed She was found to have left ear infection at the PMD office and started on antibiotics Patient presented to the hospital via EMS for increased congestion, shortness of breath, cough Her chest x-ray shows pulmonary edema.  Nephrology consult has been requested for evaluation of urgent dialysis  Objective:  Vital signs in last 24 hours:  Temp:  [98.3 F (36.8 C)-99.2 F (37.3 C)] 98.3 F (36.8 C) (02/07 0558) Pulse Rate:  [30-87] 70 (02/07 0558) Resp:  [14-24] 24 (02/07 0558) BP: (119-165)/(37-65) 165/65 (02/07 0558) SpO2:  [86 %-99 %] 99 % (02/07 0558) Weight:  [97.5 kg (215 lb)-101 kg (222 lb 11.2 oz)] 101 kg (222 lb 11.2 oz) (02/07 0558)  Weight change:  Filed Weights   10/11/17 2203 10/12/17 0558  Weight: 97.5 kg (215 lb) 101 kg (222 lb 11.2 oz)    Intake/Output:   No intake or output data in the 24 hours ending 10/12/17 0955   Physical Exam: General:  No acute distress, laying in the bed   HEENT anicteric, decreased hearing  Neck  supple  Pulm/lungs  bilateral diffuse expiratory wheezing, mild crackles  CVS/Heart  regular rhythm, no rub or gallop  Abdomen:   Soft, nontender  Extremities:  Trace edema, SCDs in place  Neurologic:  Alert, oriented  Skin:  No acute rashes  Access:  AV fistula, left forearm       Basic Metabolic Panel:  Recent Labs  Lab 10/11/17 2315  NA 131*  K 3.6  CL 96*  CO2 24  GLUCOSE 130*  BUN 26*  CREATININE 2.45*  CALCIUM 8.4*     CBC: Recent Labs  Lab 10/11/17 2315  WBC 10.7  HGB 10.5*  HCT 30.7*  MCV 103.7*  PLT 128*      Lab Results  Component Value Date   HEPBSAG Negative 08/20/2015       Microbiology:  No results found for this or any previous visit (from the past 240 hour(s)).  Coagulation Studies: No results for input(s): LABPROT, INR in the last 72 hours.  Urinalysis: No results for input(s): COLORURINE, LABSPEC, PHURINE, GLUCOSEU, HGBUR, BILIRUBINUR, KETONESUR, PROTEINUR, UROBILINOGEN, NITRITE, LEUKOCYTESUR in the last 72 hours.  Invalid input(s): APPERANCEUR    Imaging: Dg Chest 2 View  Result Date: 10/11/2017 CLINICAL DATA:  Acute onset of shortness of breath and congestion. EXAM: CHEST  2 VIEW COMPARISON:  Chest radiograph performed 08/19/2015 FINDINGS: The lungs are mildly hypoexpanded. Mild vascular crowding and vascular congestion are noted. Mildly increased interstitial markings may reflect minimal interstitial edema. There is no evidence of pleural effusion or pneumothorax. The heart is mildly enlarged. The patient is status post median sternotomy. No acute osseous abnormalities are seen. IMPRESSION: Lungs mildly hypoexpanded. Vascular congestion and mild cardiomegaly. Increased interstitial markings may reflect minimal interstitial edema. Electronically Signed   By: Garald Balding M.D.   On: 10/11/2017 22:59     Medications:    . aspirin EC  81 mg Oral Daily  . azithromycin  250 mg Oral Daily  . benzonatate  200 mg Oral TID  . carvedilol  25 mg Oral BID  . cholecalciferol  5,000 Units Oral Daily  .  clopidogrel  75 mg Oral Daily  . docusate sodium  100 mg Oral BID  . Ferrous Fumarate  1 tablet Oral Daily  . FLUoxetine  20 mg Oral Daily  . furosemide  20-80 mg Oral UD  . gabapentin  100 mg Oral QHS  . heparin  5,000 Units Subcutaneous Q8H  . hydrALAZINE  50 mg Oral BID  . insulin aspart  0-9 Units Subcutaneous TID WC  . insulin glargine  18 Units Subcutaneous QHS  . irbesartan  300 mg Oral Daily  . isosorbide mononitrate  30 mg Oral QHS  . levothyroxine  75 mcg Oral QAC breakfast  . multivitamin  1 tablet Oral QHS  . rosuvastatin  40 mg  Oral QPM  . traZODone  50 mg Oral QHS   acetaminophen **OR** acetaminophen, guaiFENesin-dextromethorphan, nystatin, ondansetron **OR** ondansetron (ZOFRAN) IV, triamcinolone cream  Assessment/ Plan:  82 y.o. female with end-stage renal disease, coronary disease, carotid stenosis, history of stroke, diabetes, CABG, obstructive sleep apnea, history of renal artery stenosis  [place] [facility]/MWF/ [facility]/ 102 kg//195 min  1.  End-stage renal disease 2.  Acute pulmonary edema 3.  Anemia of chronic kidney disease 4.  Secondary hyperparathyroidism  Plan: Urgent hemodialysis today for volume removal.  Goal 2-2.5 kg We will continue Epogen with dialysis MWF schedule Monitor phosphorus during admission    LOS: 0 Merl Guardino Candiss Norse 2/7/20199:55 [place], [place]

## 2017-10-12 NOTE — Progress Notes (Signed)
Pre HD  

## 2017-10-12 NOTE — H&P (Signed)
Dawn Bradford is an 82 y.o. female.   Chief Complaint: Shortness of breath HPI: The patient with past medical history of CHF, COPD, coronary artery disease and hypothyroidism presents to the emergency department complaining of shortness of breath.  The patient admits that she missed 1 session of dialysis on Monday but she did attend dialysis yesterday.  However she became progressively more dyspneic.  Chest x-ray showed pulmonary edema, thus the emergency department staff called the hospitalist service for further management.  Past Medical History:  Diagnosis Date  . ACE-inhibitor cough   . Aortic valve disorder    Echo in 2009 showed mean aortic valve gradient of 13 mmHg, suggesting very mild stenosis. Echo (12/10) suggested aortic sclerosis only  . Carotid artery disease (HCC)    mild, carotid dopplers 12/2009  . CHF (congestive heart failure) ([place])   . COPD (chronic obstructive pulmonary disease) ([place])   . Coronary artery disease    s/p anterior MI in 1996 followed by CABG. Lexiscan myoview (4/11): EF 67%, normal perfusion with no evidence for ischemia or infarction.   . Diabetes mellitus    type 2  . Diastolic heart failure    most recent echo (12/10) showed EF 55-60% with mild LVH, grade I diastolic dysfunction, mild LAE.  [name] ESRD (end stage renal disease) on dialysis ([place])   . Hyperlipidemia   . Hypertension    resistant hyptertension times many years. the patient does have renal artery stenosis. she has tried calcium channel blockers in the past and states that she would not take them now because she had some problems with her gums which her dentist identified as calcium-channel blocker side effects.  . Hypothyroidism   . Mild asthma    PFT 02/05/10 FEV1 1.38, FEV1% 73, TLC 3.78 (86%), DLCO 48%, +BD  . Myocardial infarction ([place])   . Obesity   . Obesity hypoventilation syndrome ([place])       . Obstructive sleep apnea    PSG 01/05/2006 AHI 24.8, CPAP 9cm H2O  . Pulmonary nodule, right    . Renal artery stenosis [facility])    The patient has an occluded right renal artery and an atrophic right kidney. There is 20% left renal artery stenosis. This was seen by catheterization in 2007  . Stroke [facility])     Past Surgical History:  Procedure Laterality Date  . ABDOMINAL HYSTERECTOMY  1985  . AV FISTULA PLACEMENT    . CATARACT EXTRACTION  2010  . CORONARY ARTERY BYPASS GRAFT  1996  . DIALYSIS FISTULA CREATION    . [place]   right  . PERIPHERAL VASCULAR CATHETERIZATION Left 01/06/2015   Procedure: A/V Shuntogram/Fistulagram;  Surgeon: Katha Cabal, MD;  Location: [place] [facility];  Service: Cardiovascular;  Laterality: Left;  . PERIPHERAL VASCULAR CATHETERIZATION N/A 02/17/2015   Procedure: Dialysis/Perma Catheter Removal;  Surgeon: Katha Cabal, MD;  Location: [place] [facility];  Service: Cardiovascular;  Laterality: N/A;  . PERIPHERAL VASCULAR CATHETERIZATION N/A 01/28/2016   Procedure: A/V Shuntogram/Fistulagram;  Surgeon: Algernon Huxley, MD;  Location: [place] [facility];  Service: Cardiovascular;  Laterality: N/A;  . PERIPHERAL VASCULAR CATHETERIZATION N/A 01/28/2016   Procedure: A/V Shunt Intervention;  Surgeon: Algernon Huxley, MD;  Location: [place] [facility];  Service: Cardiovascular;  Laterality: N/A;  . TUBAL LIGATION  1973    Family History  Problem Relation Age of Onset  . Kidney failure Mother   . Diabetes Mother   .  Aortic aneurysm Father   . Coronary artery disease Father   . Heart attack Father   . Diabetes Sister   . Diabetes Brother   . Kidney failure Brother    Social History:  reports that  has never smoked. she has never used smokeless tobacco. She reports that she does not drink alcohol or use drugs.  Allergies:  Allergies  Allergen Reactions  . Ace Inhibitors Other (See Comments)    Reaction:  Unknown   . Amlodipine Other (See Comments)    Reaction:  Unknown   . Clonidine Hydrochloride Other (See Comments)     Reaction:  Unknown   . Codeine Hives  . Metformin Nausea And Vomiting    Medications Prior to Admission  Medication Sig Dispense Refill  . acetaminophen (TYLENOL) 500 MG tablet Take 1,000 mg by mouth every 6 (six) hours as needed for mild pain.    [name] albuterol (PROVENTIL HFA;VENTOLIN HFA) 108 (90 BASE) MCG/ACT inhaler Inhale 2 puffs into the lungs every 6 (six) hours as needed for wheezing or shortness of breath.    [name] aspirin EC 81 MG tablet Take 81 mg by mouth daily.    [name] azithromycin (ZITHROMAX) 250 MG tablet 2 tablets by mouth on day 1, then take 1 tablet by mouth for days 2-5  0  . b complex-vitamin c-folic acid (NEPHRO-VITE) 0.8 MG TABS tablet Take 1 tablet by mouth daily.    . carvedilol (COREG) 25 MG tablet Take 25 mg by mouth 2 (two) times daily.     . Cholecalciferol (VITAMIN D3) 5000 UNITS TABS Take 5,000 Units by mouth daily.    . clopidogrel (PLAVIX) 75 MG tablet Take 1 tablet (75 mg total) by mouth daily. 30 tablet 2  . ferrous fumarate-iron polysaccharide complex (TANDEM) 162-115.2 MG CAPS capsule Take 1 capsule by mouth daily.    [name] FLUoxetine (PROZAC) 20 MG tablet Take 20 mg by mouth daily.   3  . furosemide (LASIX) 40 MG tablet Take 20-80 mg by mouth. Sunday, Tuesday and Saturday pt takes 80 mg in the morning. Thursdays 20 mg only. Tuesdays pt may take lower dose.    . hydrALAZINE (APRESOLINE) 50 MG tablet Take 50 mg by mouth 2 (two) times daily.   1  . insulin lispro (HUMALOG) 100 UNIT/ML injection Inject 20 Units into the skin as needed for high blood sugar.    . isosorbide mononitrate (IMDUR) 30 MG 24 hr tablet Take 30 mg by mouth at bedtime.   0  . levothyroxine (SYNTHROID, LEVOTHROID) 75 MCG tablet Take 75 mcg by mouth daily.     . rosuvastatin (CRESTOR) 40 MG tablet Take 40 mg by mouth every evening.     [name] telmisartan (MICARDIS) 80 MG tablet Take 80 mg by mouth daily. At night    . traZODone (DESYREL) 50 MG tablet Take 50 mg by mouth at bedtime.    . gabapentin  (NEURONTIN) 100 MG capsule 100 mg.   0  . insulin glargine (LANTUS) 100 UNIT/ML injection Inject 30 Units into the skin at bedtime.    . Insulin Pen Needle (FIFTY50 PEN NEEDLES) 32G X 6 MM MISC Use as directed.    . IRON PO Take by mouth.    . lactulose (CHRONULAC) 10 GM/15ML solution Place rectally.    . lidocaine-prilocaine (EMLA) cream Apply 1 application topically as needed (for pain).    . nystatin (MYCOSTATIN/NYSTOP) 100000 UNIT/GM POWD Apply 1 g topically as needed (for irritation). Reported on 01/28/2016    .  triamcinolone (KENALOG) 0.5 % cream Apply 1 application topically as needed (for irritation).       Results for orders placed or performed during the hospital encounter of 10/11/17 (from the past 48 hour(s))  CBC     Status: Abnormal   Collection Time: 10/11/17 11:15 PM  Result Value Ref Range   WBC 10.7 3.6 - 11.0 K/uL   RBC 2.96 (L) 3.80 - 5.20 MIL/uL   Hemoglobin 10.5 (L) 12.0 - 16.0 g/dL   HCT 30.7 (L) 35.0 - 47.0 %   MCV 103.7 (H) 80.0 - 100.0 fL   MCH 35.6 (H) 26.0 - 34.0 pg   MCHC 34.3 32.0 - 36.0 g/dL   RDW 13.9 11.5 - 14.5 %   Platelets 128 (L) 150 - 440 K/uL    Comment: Performed at [facility], [address] [place], [place] [address]  Comprehensive metabolic panel     Status: Abnormal   Collection Time: 10/11/17 11:15 PM  Result Value Ref Range   Sodium 131 (L) 135 - 145 mmol/L   Potassium 3.6 3.5 - 5.1 mmol/L    Comment: HEMOLYSIS AT THIS LEVEL MAY AFFECT RESULT   Chloride 96 (L) 101 - 111 mmol/L   CO2 24 22 - 32 mmol/L   Glucose, Bld 130 (H) 65 - 99 mg/dL   BUN 26 (H) 6 - 20 mg/dL   Creatinine, Ser 2.45 (H) 0.44 - 1.00 mg/dL   Calcium 8.4 (L) 8.9 - 10.3 mg/dL   Total Protein 6.4 (L) 6.5 - 8.1 g/dL   Albumin 3.3 (L) 3.5 - 5.0 g/dL   AST 36 15 - 41 U/L   ALT 17 14 - 54 U/L   Alkaline Phosphatase 73 38 - 126 U/L   Total Bilirubin 1.0 0.3 - 1.2 mg/dL   GFR calc non Af Amer 18 (L) >60 mL/min   GFR calc Af Amer 20 (L) >60 mL/min    Comment:  (NOTE) The eGFR has been calculated using the CKD EPI equation. This calculation has not been validated in all clinical situations. eGFR's persistently <60 mL/min signify possible Chronic Kidney Disease.    Anion gap 11 5 - 15    Comment: Performed at [facility], [address] [place], [place] [address]  Troponin I     Status: None   Collection Time: 10/11/17 11:15 PM  Result Value Ref Range   Troponin I <0.03 <0.03 ng/mL    Comment: Performed at [facility], [address] [place], [place] [address]  TSH     Status: None   Collection Time: 10/11/17 11:15 PM  Result Value Ref Range   TSH 2.163 0.350 - 4.500 uIU/mL    Comment: Performed by a 3rd Generation assay with a functional sensitivity of <=0.01 uIU/mL. Performed at [facility], [address] [place], [place] [address]   Influenza panel by PCR (type A & B)     Status: None   Collection Time: 10/11/17 11:33 PM  Result Value Ref Range   Influenza A By PCR NEGATIVE NEGATIVE   Influenza B By PCR NEGATIVE NEGATIVE    Comment: (NOTE) The Xpert Xpress Flu assay is intended as an aid in the diagnosis of  influenza and should not be used as a sole basis for treatment.  This  assay is FDA approved for nasopharyngeal swab specimens only. Nasal  washings and aspirates are unacceptable for Xpert Xpress Flu testing. Performed at [facility], [address] [place],  [place] [address]   Glucose, capillary     Status: Abnormal   Collection Time: 10/12/17  7:38 AM  Result Value Ref Range   Glucose-Capillary 116 (H) 65 - 99 mg/dL   Comment 1 Notify RN    Dg Chest 2 View  Result Date: 10/11/2017 CLINICAL DATA:  Acute onset of shortness of breath and congestion. EXAM: CHEST  2 VIEW COMPARISON:  Chest radiograph performed 08/19/2015 FINDINGS: The lungs are mildly hypoexpanded. Mild vascular crowding and vascular congestion are noted. Mildly increased interstitial markings may reflect minimal  interstitial edema. There is no evidence of pleural effusion or pneumothorax. The heart is mildly enlarged. The patient is status post median sternotomy. No acute osseous abnormalities are seen. IMPRESSION: Lungs mildly hypoexpanded. Vascular congestion and mild cardiomegaly. Increased interstitial markings may reflect minimal interstitial edema. Electronically Signed   By: Garald Balding M.D.   On: 10/11/2017 22:59    Review of Systems  Constitutional: Negative for chills and fever.  HENT: Negative for sore throat and tinnitus.   Eyes: Negative for blurred vision and redness.  Respiratory: Positive for shortness of breath. Negative for cough.   Cardiovascular: Negative for chest pain, palpitations, orthopnea and PND.  Gastrointestinal: Negative for abdominal pain, diarrhea, nausea and vomiting.  Genitourinary: Negative for dysuria, frequency and urgency.  Musculoskeletal: Negative for joint pain and myalgias.  Skin: Negative for rash.       No lesions  Neurological: Negative for speech change, focal weakness and weakness.  Endo/Heme/Allergies: Does not bruise/bleed easily.       No temperature intolerance  Psychiatric/Behavioral: Negative for depression and suicidal ideas.    Blood pressure (!) 165/65, pulse 70, temperature 98.3 F (36.8 C), temperature source Oral, resp. rate (!) 24, height '5\' 2"'  (1.575 m), weight 101 kg (222 lb 11.2 oz), last menstrual period 02/01/1986, SpO2 99 %. Physical Exam  Vitals reviewed. Constitutional: She is oriented to person, place, and time. She appears well-developed and well-nourished. No distress.  HENT:  Head: Normocephalic and atraumatic.  Mouth/Throat: Oropharynx is clear and moist.  Eyes: Conjunctivae and EOM are normal. Pupils are equal, round, and reactive to light. No scleral icterus.  Neck: Normal range of motion. Neck supple. No JVD present. No tracheal deviation present. No thyromegaly present.  Cardiovascular: Normal rate, regular rhythm  and normal heart sounds. Exam reveals no gallop and no friction rub.  No murmur heard. Respiratory: Effort normal and breath sounds normal.  GI: Soft. Bowel sounds are normal. She exhibits no distension. There is no tenderness.  Genitourinary:  Genitourinary Comments: Deferred  Musculoskeletal: Normal range of motion. She exhibits no edema.  Lymphadenopathy:    She has no cervical adenopathy.  Neurological: She is alert and oriented to person, place, and time. No cranial nerve deficit. She exhibits normal muscle tone.  Skin: Skin is warm and dry. No rash noted. No erythema.  Psychiatric: She has a normal mood and affect. Her behavior is normal. Judgment and thought content normal.     Assessment/Plan This is a 82 year old female admitted for hypoxia. 1.  Hypoxia: Secondary to chronic pulmonary edema.  Likely multifactorial including end-stage renal disease as well as heart failure. 2.  CHF: Diastolic; acute on chronic.  The patient missed one dialysis session earlier this week and was last dialyzed yesterday.  She will need more dialysis to maximize diuresis 3.  Hypertension: Uncontrolled; continue carvedilol, ARB and hydralazine. 4.  ESRD: Consult nephrology for continuation of dialysis 5.  Hypothyroidism: Check TSH; continue Synthroid  6.  CAD: Stable; continue aspirin and Plavix.   7.  Diabetes mellitus type 2: Continue basal insulin dosing. 8.  DVT prophylaxis: Heparin 9.  GI prophylaxis: None The patient is a full code.  Time spent on admission orders and patient care approximately 45 minutes  Harrie Foreman, MD 10/12/2017, 8:17 AM

## 2017-10-12 NOTE — Progress Notes (Signed)
HD tx end  

## 2017-10-12 NOTE — Progress Notes (Signed)
Post HD assessment  

## 2017-10-12 NOTE — Progress Notes (Signed)
Family Meeting Note  Advance Directive:yes  Today a meeting took place with the Patient.and daughter The following clinical team members were present during this meeting:MD  The following were discussed:Patient's diagnosis: Acute hypoxic respiratory failure with fluid overload and acute pulmonary edema End-stage renal disease on hemodialysis Diastolic heart failure, chronic, Patient's progosis: Unable to determine and Goals for treatment: Full Code  Additional follow-up to be provided: daughter is POA No new update needed for advanced directives  Time spent during discussion:16 minutes  Dawn Keckler, MD

## 2017-10-12 NOTE — Progress Notes (Signed)
Patient seen this morning. Discussed with daughter. Patient will need dialysis for acute pulmonary edema. She has noticed past sessions due to not feeling well. She did go to dialysis yesterday.

## 2017-10-12 NOTE — Progress Notes (Signed)
HD started. 

## 2017-10-13 ENCOUNTER — Other Ambulatory Visit: Payer: Self-pay

## 2017-10-13 ENCOUNTER — Inpatient Hospital Stay: Payer: Medicare Other

## 2017-10-13 LAB — GLUCOSE, CAPILLARY
Glucose-Capillary: 105 mg/dL — ABNORMAL HIGH (ref 65–99)
Glucose-Capillary: 102 mg/dL — ABNORMAL HIGH (ref 65–99)
Glucose-Capillary: 124 mg/dL — ABNORMAL HIGH (ref 65–99)
Glucose-Capillary: 94 mg/dL (ref 65–99)
Glucose-Capillary: 157 mg/dL — ABNORMAL HIGH (ref 65–99)

## 2017-10-13 NOTE — Progress Notes (Signed)
HD tx end  

## 2017-10-13 NOTE — Progress Notes (Signed)
Post HD assessment. Pt tolerated treatment well, per MD's orders. Goal met, lung sounds improved. MD made aware.

## 2017-10-13 NOTE — Progress Notes (Signed)
Pt has home CPAP. Pt is refusing to wear CPAP for sleep. Pt states that she only wants the oxygen while she's in the hospital. Pt's RN is at bedside and is aware of patients request

## 2017-10-13 NOTE — Progress Notes (Signed)
Pre HD assessment  

## 2017-10-13 NOTE — Progress Notes (Signed)
[facility], [place] 10/13/17  Subjective:   Patient seen during dialysis Tolerating well   HEMODIALYSIS FLOWSHEET:  Blood Flow Rate (mL/min): 300 mL/min Arterial Pressure (mmHg): -130 mmHg Venous Pressure (mmHg): 140 mmHg Transmembrane Pressure (mmHg): 20 mmHg Ultrafiltration Rate (mL/min): 830 mL/min Dialysate Flow Rate (mL/min): (SEQ) Conductivity: Machine : 13.6 Conductivity: Machine : 13.6 Dialysis Fluid Bolus: Normal Saline Bolus Amount (mL): 250 mL  Breathing is better but not back to baseline  Objective:  Vital signs in last 24 hours:  Temp:  [97.5 F (36.4 C)-99.3 F (37.4 C)] 97.5 F (36.4 C) (02/08 0900) Pulse Rate:  [35-89] 71 (02/08 0900) Resp:  [13-25] 20 (02/08 0900) BP: (99-183)/(50-139) 154/65 (02/08 0900) SpO2:  [84 %-100 %] 99 % (02/08 0900) Weight:  [101 kg (222 lb 10.6 oz)] 101 kg (222 lb 10.6 oz) (02/07 1215)  Weight change: 3.477 kg (7 lb 10.6 oz) Filed Weights   10/11/17 2203 10/12/17 0558 10/12/17 1215  Weight: 97.5 kg (215 lb) 101 kg (222 lb 11.2 oz) 101 kg (222 lb 10.6 oz)    Intake/Output:    Intake/Output Summary (Last 24 hours) at 10/13/2017 1058 Last data filed at 10/13/2017 1044 Gross per 24 hour  Intake 480 ml  Output 1806 ml  Net -1326 ml     Physical Exam: General:  No acute distress, laying in the bed   HEENT  anicteric, decreased hearing  Neck  supple  Pulm/lungs  bilateral diffuse expiratory wheezing, mild crackles  CVS/Heart  regular rhythm, no rub or gallop  Abdomen:   Soft, nontender  Extremities:  Trace edema, SCDs in place  Neurologic:  Alert, oriented  Skin:  No acute rashes  Access:  AV fistula, left forearm       Basic Metabolic Panel:  Recent Labs  Lab 10/11/17 2315  NA 131*  K 3.6  CL 96*  CO2 24  GLUCOSE 130*  BUN 26*  CREATININE 2.45*  CALCIUM 8.4*     CBC: Recent Labs  Lab 10/11/17 2315  WBC 10.7  HGB 10.5*  HCT 30.7*  MCV 103.7*  PLT 128*      Lab  Results  Component Value Date   HEPBSAG Negative 08/20/2015      Microbiology:  No results found for this or any previous visit (from the past 240 hour(s)).  Coagulation Studies: No results for input(s): LABPROT, INR in the last 72 hours.  Urinalysis: No results for input(s): COLORURINE, LABSPEC, PHURINE, GLUCOSEU, HGBUR, BILIRUBINUR, KETONESUR, PROTEINUR, UROBILINOGEN, NITRITE, LEUKOCYTESUR in the last 72 hours.  Invalid input(s): APPERANCEUR    Imaging: Dg Chest 1 View  Result Date: 10/13/2017 CLINICAL DATA:  82 year old female admitted with shortness of breath, congestion. Ear infection. Possible pneumonia. Missed a recent outpatient dialysis. EXAM: CHEST 1 VIEW COMPARISON:  10/11/2017 and earlier. FINDINGS: Portable AP upright view at 0700 hrs. Large lung volumes. Stable cardiac size and mediastinal contours. Prior CABG. Decreased but not normalized pulmonary vascularity. No pneumothorax, pleural effusion or consolidation. Paucity of bowel gas in the upper abdomen. No acute osseous abnormality identified. IMPRESSION: Larger lung volumes and improved ventilation with decreased but not resolved pulmonary interstitial edema. Electronically Signed   By: Genevie Ann M.D.   On: 10/13/2017 07:43   Dg Chest 2 View  Result Date: 10/11/2017 CLINICAL DATA:  Acute onset of shortness of breath and congestion. EXAM: CHEST  2 VIEW COMPARISON:  Chest radiograph performed 08/19/2015 FINDINGS: The lungs are mildly hypoexpanded. Mild vascular crowding and vascular congestion  are noted. Mildly increased interstitial markings may reflect minimal interstitial edema. There is no evidence of pleural effusion or pneumothorax. The heart is mildly enlarged. The patient is status post median sternotomy. No acute osseous abnormalities are seen. IMPRESSION: Lungs mildly hypoexpanded. Vascular congestion and mild cardiomegaly. Increased interstitial markings may reflect minimal interstitial edema. Electronically Signed    By: Garald Balding M.D.   On: 10/11/2017 22:59     Medications:    . aspirin EC  81 mg Oral Daily  . azithromycin  250 mg Oral Daily  . benzonatate  200 mg Oral TID  . carvedilol  25 mg Oral 2 times per day on Sun Tue Thu Sat  . carvedilol  25 mg Oral Once per day on Mon Wed Fri  . cholecalciferol  5,000 Units Oral Daily  . clopidogrel  75 mg Oral Daily  . docusate sodium  100 mg Oral BID  . Ferrous Fumarate  1 tablet Oral Daily  . FLUoxetine  20 mg Oral Daily  . [START ON 10/14/2017] furosemide  80 mg Oral Once per day on Sun Tue Sat   And  . [START ON 10/19/2017] furosemide  20 mg Oral Q Thu  . heparin  5,000 Units Subcutaneous Q8H  . hydrALAZINE  50 mg Oral BID  . insulin aspart  0-9 Units Subcutaneous TID WC  . insulin glargine  18 Units Subcutaneous QHS  . irbesartan  300 mg Oral Daily  . isosorbide mononitrate  30 mg Oral QHS  . levothyroxine  75 mcg Oral QAC breakfast  . multivitamin  1 tablet Oral QHS  . rosuvastatin  40 mg Oral QPM  . traZODone  50 mg Oral QHS   acetaminophen **OR** acetaminophen, guaiFENesin-dextromethorphan, ipratropium-albuterol, nystatin, ondansetron **OR** ondansetron (ZOFRAN) IV, triamcinolone cream  Assessment/ Plan:  82 y.o. female with end-stage renal disease, coronary disease, carotid stenosis, history of stroke, diabetes, CABG, obstructive sleep apnea, history of renal artery stenosis  [facility]/MWF/ [facility]/ 102 kg//195 min  1.  End-stage renal disease 2.  Acute pulmonary edema 3.  Anemia of chronic kidney disease 4.  Secondary hyperparathyroidism  Plan: SEQ dialysis treatment today. UF 2000 cc.  We will continue Epogen with dialysis MWF schedule Monitor phosphorus during admission Standing weight  Continue furosemide on non dialysis days    LOS: [place] 2/8/201910:58 [place], [place]

## 2017-10-13 NOTE — Progress Notes (Signed)
Post HD assessment  

## 2017-10-13 NOTE — Progress Notes (Signed)
PT Cancellation Note  Patient Details Name: Dawn Bradford MRN: [id number] DOB: [birth year]   Cancelled Treatment:    Reason Eval/Treat Not Completed: Other (comment).  Pt declined stating she is just too tired to do therapy.  WIll try again later as time and pt allow.   Ramond Dial 10/13/2017, 11:37 AM  Mee Hives, PT MS Acute Rehab Dept. Number: [place] and [place]

## 2017-10-13 NOTE — Progress Notes (Signed)
[place] [facility] NAME: Dawn Bradford    MR#:  [id number]  DATE OF BIRTH:  [birth year]  SUBJECTIVE:   still sob this morning with cough however much improved since admission  REVIEW OF SYSTEMS:    Review of Systems  Constitutional: Negative for fever, chills weight loss HENT: Negative for ear pain, nosebleeds, congestion, facial swelling, rhinorrhea, neck pain, neck stiffness and ear discharge.   Respiratory: Positive for cough and shortness of breath however much improved since admission  Cardiovascular: Negative for chest pain, palpitations and leg swelling.  Gastrointestinal: Negative for heartburn, abdominal pain, vomiting, diarrhea or consitpation Genitourinary: Negative for dysuria, urgency, frequency, hematuria Musculoskeletal: Negative for back pain or joint pain Neurological: Negative for dizziness, seizures, syncope, focal weakness,  numbness and headaches.  Hematological: Does not bruise/bleed easily.  Psychiatric/Behavioral: Negative for hallucinations, confusion, dysphoric mood    Tolerating Diet: yes      DRUG ALLERGIES:   Allergies  Allergen Reactions  . Ace Inhibitors Other (See Comments)    Reaction:  Unknown   . Amlodipine Other (See Comments)    Reaction:  Unknown   . Clonidine Hydrochloride Other (See Comments)    Reaction:  Unknown   . Codeine Hives  . Metformin Nausea And Vomiting    VITALS:  Blood pressure (!) 162/54, pulse 89, temperature 98.2 F (36.8 C), temperature source Oral, resp. rate 18, height 5\' 2"  (1.575 m), weight 101 kg (222 lb 10.6 oz), last menstrual period 02/01/1986, SpO2 96 %.  PHYSICAL EXAMINATION:  Constitutional: Appears well-developed and well-nourished. No distress. HENT: Normocephalic. [name] Oropharynx is clear and moist.  Eyes: Conjunctivae and EOM are normal. PERRLA, no scleral icterus.  Neck: Normal ROM. Neck supple. No JVD. No tracheal deviation. CVS: RRR, S1/S2 +, no  murmurs, no gallops, no carotid bruit.  Pulmonary: Normal effort with rhonchi bilaterally   Abdominal: Soft. BS +,  no distension, tenderness, rebound or guarding.  Musculoskeletal: Normal range of motion. No edema and no tenderness.  Neuro: Alert. CN 2-12 grossly intact. No focal deficits. Skin: Skin is warm and dry. No rash noted. Psychiatric: Normal mood and affect.      LABORATORY PANEL:   CBC Recent Labs  Lab 10/11/17 2315  WBC 10.7  HGB 10.5*  HCT 30.7*  PLT 128*   ------------------------------------------------------------------------------------------------------------------  Chemistries  Recent Labs  Lab 10/11/17 2315  NA 131*  K 3.6  CL 96*  CO2 24  GLUCOSE 130*  BUN 26*  CREATININE 2.45*  CALCIUM 8.4*  AST 36  ALT 17  ALKPHOS 73  BILITOT 1.0   ------------------------------------------------------------------------------------------------------------------  Cardiac Enzymes Recent Labs  Lab 10/11/17 2315  TROPONINI <0.03   ------------------------------------------------------------------------------------------------------------------  RADIOLOGY:  Dg Chest 1 View  Result Date: 10/13/2017 CLINICAL DATA:  82 year old female admitted with shortness of breath, congestion. Ear infection. Possible pneumonia. Missed a recent outpatient dialysis. EXAM: CHEST 1 VIEW COMPARISON:  10/11/2017 and earlier. FINDINGS: Portable AP upright view at 0700 hrs. Large lung volumes. Stable cardiac size and mediastinal contours. Prior CABG. Decreased but not normalized pulmonary vascularity. No pneumothorax, pleural effusion or consolidation. Paucity of bowel gas in the upper abdomen. No acute osseous abnormality identified. IMPRESSION: Larger lung volumes and improved ventilation with decreased but not resolved pulmonary interstitial edema. Electronically Signed   By: Genevie Ann M.D.   On: 10/13/2017 07:43   Dg Chest 2 View  Result Date: 10/11/2017 CLINICAL DATA:  Acute  onset of shortness of breath and  congestion. EXAM: CHEST  2 VIEW COMPARISON:  Chest radiograph performed 08/19/2015 FINDINGS: The lungs are mildly hypoexpanded. Mild vascular crowding and vascular congestion are noted. Mildly increased interstitial markings may reflect minimal interstitial edema. There is no evidence of pleural effusion or pneumothorax. The heart is mildly enlarged. The patient is status post median sternotomy. No acute osseous abnormalities are seen. IMPRESSION: Lungs mildly hypoexpanded. Vascular congestion and mild cardiomegaly. Increased interstitial markings may reflect minimal interstitial edema. Electronically Signed   By: Garald Balding M.D.   On: 10/11/2017 22:59     ASSESSMENT AND PLAN:   82 year old female with a history of COPD, end-stage renal disease on hemodialysis and chronic diastolic heart failure who presented with shortness of breath  1. Acute hypoxic respiratory failure due to pulmonary edema. Patient missed dialysis on Monday because she was sick. Wean oxygen as tolerated Chest x-ray showing improvement in pulmonary edema   2. End-stage renal disease on hemodialysis: I spoke with Dr. Candiss Norse. Patient will go for dialysis today.  3. Acute on chronic diastolic heart failure with fluid overload Continue dialysis to maximize diuresis Continue Lasix 4. Essential hypertension: Continue Coreg, hydralazine, Avapro, isosorbide  5. Hypogonadism: Continue Synthroid  6. Hyperlipidemia: Continue statin  7. Diabetes: Continue Lantus with sliding scale and ADA diet  8. Cough from fluid overload and possible bronchitis Continue cough syrup and azithromycin  Physical therapy consultation for discharge planning  Discussed with Dr. Candiss Norse this morning    Management plans discussed with the patient and she is in agreement.  CODE STATUS: Full  TOTAL TIME TAKING CARE OF THIS PATIENT: 22 minutes.     POSSIBLE D/C tomorrow, DEPENDING ON CLINICAL  CONDITION.   Engelbert Sevin M.D on 10/13/2017 at 9:00 AM  Between 7am to 6pm - Pager - [phone] After 6pm go to [email or web address] - password [email or web address] [facility]  Office  [phone]  CC: Primary care physician; Hortencia Pilar, MD  Note: This dictation was prepared with Dragon dictation along with smaller phrase technology. Any transcriptional errors that result from this process are unintentional.

## 2017-10-13 NOTE — Progress Notes (Signed)
HD tx start 

## 2017-10-14 ENCOUNTER — Inpatient Hospital Stay: Payer: Medicare Other

## 2017-10-14 LAB — GLUCOSE, CAPILLARY
Glucose-Capillary: 122 mg/dL — ABNORMAL HIGH (ref 65–99)
Glucose-Capillary: 121 mg/dL — ABNORMAL HIGH (ref 65–99)
Glucose-Capillary: 115 mg/dL — ABNORMAL HIGH (ref 65–99)
Glucose-Capillary: 117 mg/dL — ABNORMAL HIGH (ref 65–99)

## 2017-10-14 LAB — MRSA PCR SCREENING: MRSA BY PCR: NEGATIVE

## 2017-10-14 MED ORDER — PSEUDOEPHEDRINE HCL 30 MG PO TABS
30.0000 mg | ORAL_TABLET | Freq: Four times a day (QID) | ORAL | Status: DC | PRN
  Administered 2017-10-14: 30 mg via ORAL
  Filled 2017-10-14 (×2): qty 1

## 2017-10-14 MED ORDER — PSEUDOEPHEDRINE HCL 30 MG/5ML PO SYRP
30.0000 mg | ORAL_SOLUTION | ORAL | Status: DC | PRN
  Filled 2017-10-14: qty 5

## 2017-10-14 MED ORDER — FLUTICASONE PROPIONATE 50 MCG/ACT NA SUSP
2.0000 | Freq: Every day | NASAL | Status: DC
  Administered 2017-10-14 – 2017-10-15 (×2): 2 via NASAL
  Filled 2017-10-14: qty 16

## 2017-10-14 MED ORDER — FUROSEMIDE 10 MG/ML IJ SOLN
80.0000 mg | Freq: Two times a day (BID) | INTRAMUSCULAR | Status: DC
  Administered 2017-10-14 – 2017-10-15 (×3): 80 mg via INTRAVENOUS
  Filled 2017-10-14 (×3): qty 8

## 2017-10-14 NOTE — Progress Notes (Signed)
10/14/17 1700  PT Visit Information  Last PT Received On 10/14/17  Assistance Needed +1  History of Present Illness 82 yo female with onset of hypoxia from missed HD and fluid overload was admitted, had refused CPAP as well. noted interstitial edema in lungs, SOB, PMHx:  CHF, COPD, CAD, ESRD, DM, aortic valve disorder, pulmonary nodule, stents, MI  Precautions  Precautions Fall  Precaution Comments confusion  Restrictions  Weight Bearing Restrictions No  Home Living  Family/patient expects to be discharged to: Private residence  Living Arrangements Spouse/significant other  Available Help at Discharge Family;Available 24 hours/day  Type of [place] One level  Tax adviser - 2 wheels;Cane - single point  Prior Function  Level of Independence Independent with assistive device(s)  Comments home with husband  Communication  Communication HOH  Pain Assessment  Pain Assessment Faces  Faces Pain Scale 6  Pain Location LE pain  Pain Descriptors / Indicators Sore  Pain Intervention(s) Limited activity within patient's tolerance;Repositioned;Monitored during session  Cognition  Arousal/Alertness Lethargic;Awake/alert  Behavior During Therapy Agitated  Overall Cognitive Status Within Functional Limits for tasks assessed  Upper Extremity Assessment  Upper Extremity Assessment Generalized weakness  Lower Extremity Assessment  Lower Extremity Assessment Generalized weakness  Cervical / Trunk Assessment  Cervical / Trunk Assessment Kyphotic  Bed Mobility  Overal bed mobility Needs Assistance  Bed Mobility Supine to Sit  Supine to sit Mod assist  General bed mobility comments supported trunk and asked pt to mvoe her legs due to pain complaints  Transfers  Overall transfer level Needs assistance  Equipment used Rolling walker (2 wheeled);1 person hand held assist  Transfers Sit to/from Stand  Sit to Stand Mod assist  General transfer  comment significant help to stand with RW  Ambulation/Gait  Ambulation/Gait assistance Min assist;+2 physical assistance;+2 safety/equipment  Ambulation Distance (Feet) 5 Feet  Assistive device Rolling walker (2 wheeled);1 person hand held assist;2 person hand held assist  Gait Pattern/deviations Step-to pattern;Decreased stride length;Wide base of support;Narrow base of support;Trunk flexed  General Gait Details sidesteps with nursing in room to chair, very weak and buckled appearance of knees  Gait velocity reduced  Gait velocity interpretation Below normal speed for age/gender  Modified Rankin (Stroke Patients Only)  Pre-Morbid Rankin Score 3  Modified Rankin 4  Balance  Overall balance assessment Needs assistance  Sitting-balance support Feet supported  Sitting balance-Leahy Scale Fair  Postural control Left lateral lean  Standing balance support Bilateral upper extremity supported;During functional activity  Standing balance-Leahy Scale Poor  Exercises  Exercises Other exercises (strength in BLE's is 4- to 4+)  PT - End of Session  Equipment Utilized During Treatment Gait belt;Oxygen  Activity Tolerance Patient limited by fatigue;Patient limited by lethargy  Patient left in chair;with call bell/phone within reach;with chair alarm set;with family/visitor present  Nurse Communication Mobility status  PT Assessment  PT Recommendation/Assessment Patient needs continued PT services  PT Visit Diagnosis Unsteadiness on feet (R26.81);Other abnormalities of gait and mobility (R26.89);Muscle weakness (generalized) (M62.81);History of falling (Z91.81);Difficulty in walking, not elsewhere classified (R26.2);Adult, failure to thrive (R62.7)  PT Problem List Decreased strength;Decreased range of motion;Decreased activity tolerance;Decreased balance;Decreased mobility;Decreased coordination;Decreased knowledge of use of DME;Decreased safety awareness;Cardiopulmonary status limiting  activity;Obesity;Pain  Barriers to Discharge Inaccessible home environment  Barriers to Discharge Comments requires 2 person help to walk and at that only transfers  PT Plan  PT Frequency (ACUTE ONLY) Min 2X/week  PT Treatment/Interventions (ACUTE ONLY)  DME instruction;Gait training;Therapeutic activities;Functional mobility training;Therapeutic exercise;Balance training;Neuromuscular re-education;Patient/family education  AM-PAC PT "6 Clicks" Daily Activity Outcome Measure  Difficulty turning over in bed (including adjusting bedclothes, sheets and blankets)? 2  Difficulty moving from lying on back to sitting on the side of the bed?  1  Difficulty sitting down on and standing up from a chair with arms (e.g., wheelchair, bedside commode, etc,.)? 1  Help needed moving to and from a bed to chair (including a wheelchair)? 2  Help needed walking in hospital room? 1  Help needed climbing 3-5 steps with a railing?  1  6 Click Score 8  Mobility G Code  CM  PT Recommendation  Follow Up Recommendations SNF  PT equipment Rolling walker with 5" wheels  Individuals Consulted  Consulted and Agree with Results and Recommendations Patient  Acute Rehab PT Goals  Patient Stated Goal to get stronger   PT Goal Formulation With patient  Time For Goal Achievement 10/28/17  Potential to Achieve Goals Good  PT Time Calculation  PT Start Time (ACUTE ONLY) 1426  PT Stop Time (ACUTE ONLY) 1450  PT Time Calculation (min) (ACUTE ONLY) 24 min  PT G-Codes **NOT FOR INPATIENT CLASS**  Functional Assessment Tool Used AM-PAC 6 Clicks Basic Mobility  PT General Charges  $$ ACUTE PT VISIT 1 Visit  PT Evaluation  $PT Eval Moderate Complexity 1 Mod  PT Treatments  $Gait Training 8-22 mins   Mee Hives, PT MS Acute Rehab Dept. Number: [place] and [place]

## 2017-10-14 NOTE — Progress Notes (Signed)
[facility], [place] 10/14/17  Subjective:   3.8 L of fluid has been removed with HD so far Breathing is better but not back to baseline Reports hearing loss in left ear Reports cough and wheezing  Objective:  Vital signs in last 24 hours:  Temp:  [97.5 F (36.4 C)-99.2 F (37.3 C)] 97.5 F (36.4 C) (02/09 0518) Pulse Rate:  [67-89] 74 (02/09 0939) Resp:  [14-32] 22 (02/09 0518) BP: (116-181)/(50-94) 138/55 (02/09 1144) SpO2:  [96 %-100 %] 97 % (02/09 1114) Weight:  [104.5 kg (230 lb 6.1 oz)-106 kg (233 lb 11 oz)] 104.5 kg (230 lb 6.1 oz) (02/08 1724)  Weight change: 5 kg (11 lb 0.4 oz) Filed Weights   10/12/17 1215 10/13/17 1350 10/13/17 1724  Weight: 101 kg (222 lb 10.6 oz) 106 kg (233 lb 11 oz) 104.5 kg (230 lb 6.1 oz)    Intake/Output:    Intake/Output Summary (Last 24 hours) at 10/14/2017 1155 Last data filed at 10/14/2017 1147 Gross per 24 hour  Intake 240 ml  Output 2473 ml  Net -2233 ml     Physical Exam: General:  No acute distress, laying in the bed   HEENT  anicteric, decreased hearing  Neck  supple  Pulm/lungs  bilateral diffuse expiratory wheezing, mild crackles  CVS/Heart  regular rhythm, no rub or gallop  Abdomen:   Soft, nontender  Extremities:  Trace edema, SCDs in place  Neurologic:  Alert, oriented  Skin:  No acute rashes  Access:  AV fistula, left forearm       Basic Metabolic Panel:  Recent Labs  Lab 10/11/17 2315  NA 131*  K 3.6  CL 96*  CO2 24  GLUCOSE 130*  BUN 26*  CREATININE 2.45*  CALCIUM 8.4*     CBC: Recent Labs  Lab 10/11/17 2315  WBC 10.7  HGB 10.5*  HCT 30.7*  MCV 103.7*  PLT 128*      Lab Results  Component Value Date   HEPBSAG Negative 08/20/2015      Microbiology:  No results found for this or any previous visit (from the past 240 hour(s)).  Coagulation Studies: No results for input(s): LABPROT, INR in the last 72 hours.  Urinalysis: No results for input(s):  COLORURINE, LABSPEC, PHURINE, GLUCOSEU, HGBUR, BILIRUBINUR, KETONESUR, PROTEINUR, UROBILINOGEN, NITRITE, LEUKOCYTESUR in the last 72 hours.  Invalid input(s): APPERANCEUR    Imaging: Dg Chest 1 View  Result Date: 10/13/2017 CLINICAL DATA:  82 year old female admitted with shortness of breath, congestion. Ear infection. Possible pneumonia. Missed a recent outpatient dialysis. EXAM: CHEST 1 VIEW COMPARISON:  10/11/2017 and earlier. FINDINGS: Portable AP upright view at 0700 hrs. Large lung volumes. Stable cardiac size and mediastinal contours. Prior CABG. Decreased but not normalized pulmonary vascularity. No pneumothorax, pleural effusion or consolidation. Paucity of bowel gas in the upper abdomen. No acute osseous abnormality identified. IMPRESSION: Larger lung volumes and improved ventilation with decreased but not resolved pulmonary interstitial edema. Electronically Signed   By: Genevie Ann M.D.   On: 10/13/2017 07:43   Dg Chest Port 1 View  Result Date: 10/14/2017 CLINICAL DATA:  Shortness of breath and cough for the past several weeks. EXAM: PORTABLE CHEST 1 VIEW COMPARISON:  Chest x-ray from yesterday. FINDINGS: Stable cardiomediastinal silhouette status post CABG. Mild persistent interstitial edema, similar to prior study. No focal consolidation, pleural effusion, or pneumothorax. No acute osseous abnormality. IMPRESSION: Persistent mild interstitial edema. Electronically Signed   By: Orville Govern.D.  On: 10/14/2017 07:55     Medications:    . aspirin EC  81 mg Oral Daily  . azithromycin  250 mg Oral Daily  . benzonatate  200 mg Oral TID  . carvedilol  25 mg Oral 2 times per day on Sun Tue Thu Sat  . carvedilol  25 mg Oral Once per day on Mon Wed Fri  . clopidogrel  75 mg Oral Daily  . docusate sodium  100 mg Oral BID  . FLUoxetine  20 mg Oral Daily  . fluticasone  2 spray Each Nare Daily  . furosemide  80 mg Intravenous BID  . heparin  5,000 Units Subcutaneous Q8H  . hydrALAZINE   50 mg Oral BID  . insulin aspart  0-9 Units Subcutaneous TID WC  . insulin glargine  18 Units Subcutaneous QHS  . irbesartan  300 mg Oral Daily  . isosorbide mononitrate  30 mg Oral QHS  . levothyroxine  75 mcg Oral QAC breakfast  . multivitamin  1 tablet Oral QHS  . rosuvastatin  40 mg Oral QPM  . traZODone  50 mg Oral QHS   acetaminophen **OR** acetaminophen, guaiFENesin-dextromethorphan, ipratropium-albuterol, nystatin, ondansetron **OR** ondansetron (ZOFRAN) IV, pseudoephedrine, triamcinolone cream  Assessment/ Plan:  82 y.o. female with end-stage renal disease, coronary disease, carotid stenosis, history of stroke, diabetes, CABG, obstructive sleep apnea, history of renal artery stenosis  [facility]/MWF/ [facility]/ 102 kg//195 min  1.  End-stage renal disease 2.  Acute pulmonary edema 3.  Anemia of chronic kidney disease 4.  Secondary hyperparathyroidism  Plan: Total 3.8 L has been removed with HD so far but patient is still wheezing and has crackles by exam which could be related to underlying bronchitis. Antibiotics as per IM team - Change lasix to IV for now for additional diuresis - low dose decongestant - stop all non essential medications (vit D, Iron) - We will continue Epogen with dialysis MWF schedule - Monitor phosphorus during admission - Standing weight       LOS: 2 [place] Beich[place] Candiss Norse 2/9/201911:55 AM  [facility] [place], [place]

## 2017-10-14 NOTE — Progress Notes (Signed)
[place] [facility] [place] NAME: Dawn Bradford    MR#:  [id number]  DATE OF BIRTH:  [birth year]  SUBJECTIVE:   Patient's daughter is at bedside. Patient still having some difficulty time hearing out of her left ear. Daughter says patient was just diagnosed with the ear infection prior to admission. Still has some congestion.  REVIEW OF SYSTEMS:    Review of Systems  Constitutional: Negative for chills and fever.  HENT: Negative for congestion and tinnitus.   Eyes: Negative for blurred vision and double vision.  Respiratory: Negative for cough, shortness of breath and wheezing.   Cardiovascular: Negative for chest pain, orthopnea and PND.  Gastrointestinal: Negative for abdominal pain, diarrhea, nausea and vomiting.  Genitourinary: Negative for dysuria and hematuria.  Neurological: Negative for dizziness, sensory change and focal weakness.  All other systems reviewed and are negative.   Nutrition: Renal/Carb modified Tolerating Diet: yes Tolerating PT: Await Eval.      DRUG ALLERGIES:   Allergies  Allergen Reactions  . Ace Inhibitors Other (See Comments)    Reaction:  Unknown   . Amlodipine Other (See Comments)    Reaction:  Unknown   . Clonidine Hydrochloride Other (See Comments)    Reaction:  Unknown   . Codeine Hives  . Metformin Nausea And Vomiting    VITALS:  Blood pressure (!) 136/50, pulse 76, temperature 97.8 F (36.6 C), temperature source Oral, resp. rate (!) 21, height 5\' 2"  (1.575 m), weight 104.5 kg (230 lb 6.1 oz), last menstrual period 02/01/1986, SpO2 97 %.  PHYSICAL EXAMINATION:   Physical Exam  GENERAL:  82 y.o.-year-old patient lying in bed in no acute distress.  EYES: Pupils equal, round, reactive to light and accommodation. No scleral icterus. Extraocular muscles intact.  HEENT: Head atraumatic, normocephalic. Oropharynx and nasopharynx clear.  Mild Left middle Ear inflammation with fluid noted. NECK:  Supple,  no jugular venous distention. No thyroid enlargement, no tenderness.  LUNGS: Normal breath sounds bilaterally, no wheezing, rales, rhonchi. No use of accessory muscles of respiration.  CARDIOVASCULAR: S1, S2 normal. No murmurs, rubs, or gallops.  ABDOMEN: Soft, nontender, nondistended. Bowel sounds present. No organomegaly or mass.  EXTREMITIES: No cyanosis, clubbing, +1-2 edema b/l.    NEUROLOGIC: Cranial nerves II through XII are intact. No focal Motor or sensory deficits b/l.  Globally weak.  PSYCHIATRIC: The patient is alert and oriented x 3.  SKIN: No obvious rash, lesion, or ulcer.    LABORATORY PANEL:   CBC Recent Labs  Lab 10/11/17 2315  WBC 10.7  HGB 10.5*  HCT 30.7*  PLT 128*   ------------------------------------------------------------------------------------------------------------------  Chemistries  Recent Labs  Lab 10/11/17 2315  NA 131*  K 3.6  CL 96*  CO2 24  GLUCOSE 130*  BUN 26*  CREATININE 2.45*  CALCIUM 8.4*  AST 36  ALT 17  ALKPHOS 73  BILITOT 1.0   ------------------------------------------------------------------------------------------------------------------  Cardiac Enzymes Recent Labs  Lab 10/11/17 2315  TROPONINI <0.03   ------------------------------------------------------------------------------------------------------------------  RADIOLOGY:  Dg Chest 1 View  Result Date: 10/13/2017 CLINICAL DATA:  82 year old female admitted with shortness of breath, congestion. Ear infection. Possible pneumonia. Missed a recent outpatient dialysis. EXAM: CHEST 1 VIEW COMPARISON:  10/11/2017 and earlier. FINDINGS: Portable AP upright view at 0700 hrs. Large lung volumes. Stable cardiac size and mediastinal contours. Prior CABG. Decreased but not normalized pulmonary vascularity. No pneumothorax, pleural effusion or consolidation. Paucity of bowel gas in the upper abdomen. No acute osseous abnormality identified.  IMPRESSION: Larger lung volumes and  improved ventilation with decreased but not resolved pulmonary interstitial edema. Electronically Signed   By: Genevie Ann M.D.   On: 10/13/2017 07:43   Dg Chest Port 1 View  Result Date: 10/14/2017 CLINICAL DATA:  Shortness of breath and cough for the past several weeks. EXAM: PORTABLE CHEST 1 VIEW COMPARISON:  Chest x-ray from yesterday. FINDINGS: Stable cardiomediastinal silhouette status post CABG. Mild persistent interstitial edema, similar to prior study. No focal consolidation, pleural effusion, or pneumothorax. No acute osseous abnormality. IMPRESSION: Persistent mild interstitial edema. Electronically Signed   By: Titus Dubin M.D.   On: 10/14/2017 07:55     ASSESSMENT AND PLAN:   82 year old female with a history of COPD, end-stage renal disease on hemodialysis and chronic diastolic heart failure who presented with shortness of breath  1. Acute hypoxic respiratory failure due to pulmonary edema. Patient missed dialysis on Monday because she was sick. -Patient's O2 sats is stable and willing oxygen as tolerated  2. End-stage renal disease on hemodialysis  -Patient received hemodialysis the past 2 days, discussed with nephrology and no plan for dialysis today. -Continue further care as per nephrology.  3. Acute on chronic diastolic heart failure with fluid overload -Continue dialysis to maximize diuresis - Continue Lasix as per Nephro as pt. Still makes urine.   4. Essential hypertension: Continue Coreg, Imdur, Avapro, Hydralazine.   5. Hypothyroidism - Continue Synthroid  6. Hyperlipidemia: Continue Crestor  7. Diabetes: Continue Lantus with sliding scale and ADA diet - BS Stable.   8. Cough from fluid overload and possible bronchitis - cont. Zithromax, Robitussin.   9. Otitis Media - cont. Zithromax.  Pt. Is having some hearing loss issues and told daughter to follow up with ENT as outpatient if pt. Not improved w/ abx.   10. Depression - cont. Prozac, Trazodone.    Await PT eval.   All the records are reviewed and case discussed with Care Management/Social Worker. Management plans discussed with the patient, family and they are in agreement.  CODE STATUS: Full code  DVT Prophylaxis: Hep. SQ  TOTAL TIME TAKING CARE OF THIS PATIENT: 30 minutes.   POSSIBLE D/C IN 1-2 DAYS, DEPENDING ON CLINICAL CONDITION.   Henreitta Leber M.D on 10/14/2017 at 3:27 PM  Between 7am to 6pm - Pager - [phone]  After 6pm go to [email or web address] - Proofreader  [facility]  Office  [phone]  CC: Primary care physician; Hortencia Pilar, MD

## 2017-10-14 NOTE — Progress Notes (Signed)
Notified Dr. Verdell Carmine of family concern that no one had addressed the ear infection that was diagnosed outpatient. Informed daughter that pt had been receiving antibiotics while hospitalized. Also per MD okay to order flonase as pt is congested this morning.

## 2017-10-15 LAB — GLUCOSE, CAPILLARY
Glucose-Capillary: 91 mg/dL (ref 65–99)
Glucose-Capillary: 127 mg/dL — ABNORMAL HIGH (ref 65–99)

## 2017-10-15 MED ORDER — AZITHROMYCIN 250 MG PO TABS: 250.0000 mg | ORAL_TABLET | Freq: Every day | ORAL | 0 refills | Status: AC

## 2017-10-15 MED ORDER — PSEUDOEPHEDRINE HCL 30 MG PO TABS: 30.0000 mg | ORAL_TABLET | Freq: Four times a day (QID) | ORAL | 0 refills | Status: DC | PRN

## 2017-10-15 NOTE — Clinical Social Work Note (Signed)
Clinical Social Work Assessment  Patient Details  Name: Dawn Bradford MRN: [id number] Date of Birth: 04-09-1936  Date of referral:  10/15/17               Reason for consult:  Discharge Planning                Permission sought to share information with:  Facility Sport and exercise psychologist, Family Supports Permission granted to share information::  Yes, Verbal Permission Granted  Name::        Agency::     Relationship::     Contact Information:     Housing/Transportation Living arrangements for the past 2 months:  Single Family Home Source of Information:  Patient Patient Interpreter Needed:  None Criminal Activity/Legal Involvement Pertinent to Current Situation/Hospitalization:  No - Comment as needed Significant Relationships:  Adult Children Lives with:  Self Do you feel safe going back to the place where you live?  Yes Need for family participation in patient care:  Yes (Comment)  Care giving concerns:  Patient resides at home.   Social Worker assessment / plan:  PT recommended STR for patient. Patient ready for discharge. CSW spoke with patient regarding STR and she agreed. She asked CSW to call her daughter, Benjamine Mola, which Barron did. Benjamine Mola stated that she was in agreement with rehab and stated that she had been to [facility] in the past and liked it. CSW did a bed search and [facility] was able to make an offer which patient and Benjamine Mola accepted. Patient discharged today. Discharge information sent to [facility]. Nurse called report and patient transferred via EMS.  Employment status:  Retired Forensic scientist:  Medicare PT Recommendations:  [place] / Referral to community resources:     Patient/Family's Response to care:  Patient and Benjamine Mola expressed appreciation for CSW assistance.  Patient/Family's Understanding of and Emotional Response to Diagnosis, Current Treatment, and Prognosis:  Patient and Benjamine Mola are involved in patient's treatment  plan.  Emotional Assessment Appearance:  Appears stated age Attitude/Demeanor/Rapport:  Gracious Affect (typically observed):  Adaptable, Accepting, Calm Orientation:  Oriented to Self, Oriented to Place, Oriented to  Time, Oriented to Situation Alcohol / Substance use:  Not Applicable Psych involvement (Current and /or in the community):  No (Comment)  Discharge Needs  Concerns to be addressed:  Care Coordination Readmission within the last 30 days:  No Current discharge risk:  None Barriers to Discharge:  No Barriers Identified   Shela Leff, LCSW 10/15/2017, 5:50 PM

## 2017-10-15 NOTE — Clinical Social Work Placement (Signed)
   CLINICAL SOCIAL WORK PLACEMENT  NOTE  Date:  10/15/2017  Patient Details  Name: Dawn Bradford MRN: [id number] Date of Birth: 1936-01-01  Clinical Social Work is seeking post-discharge placement for this patient at the [place] level of care (*CSW will initial, date and re-position this form in  chart as items are completed):  Yes   Patient/family provided with [place] Work Department's list of facilities offering this level of care within the geographic area requested by the patient (or if unable, by the patient's family).  Yes   Patient/family informed of their freedom to choose among providers that offer the needed level of care, that participate in Medicare, Medicaid or managed care program needed by the patient, have an available bed and are willing to accept the patient.  Yes   Patient/family informed of [facility]'s ownership interest in [facility] and [facility], as well as of the fact that they are under no obligation to receive care at these facilities.  PASRR submitted to EDS on       PASRR number received on       Existing PASRR number confirmed on 10/15/17     FL2 transmitted to all facilities in geographic area requested by pt/family on 10/15/17     FL2 transmitted to all facilities within larger geographic area on       Patient informed that his/her managed care company has contracts with or will negotiate with certain facilities, including the following:        Yes   Patient/family informed of bed offers received.  Patient chooses bed at [facility])     Physician recommends and patient chooses bed at [facility])    Patient to be transferred to ([facility]) on 10/15/17.  Patient to be transferred to facility by (EMS)     Patient family notified on 10/15/17 of transfer.  Name of family member notified:  Benjamine Mola)     PHYSICIAN       Additional Comment:     _______________________________________________ Shela Leff, LCSW 10/15/2017, 5:53 PM

## 2017-10-15 NOTE — NC FL2 (Signed)
[place] LEVEL OF CARE SCREENING TOOL     IDENTIFICATION  Patient Name: Dawn Bradford Birthdate: [birth year] Sex: female Admission Date (Current Location): 10/11/2017  [place] and [place] Number:  Engineering geologist and Address:  [facility], [address] [place], [place] [address]      Provider Number: [id number]  Attending Physician Name and Address:  Henreitta Leber, MD  Relative Name and Phone Number:       Current Level of Care: Hospital Recommended Level of Care: [place] Prior Approval Number:    Date Approved/Denied:   PASRR Number: [id number]  Discharge Plan: SNF    Current Diagnoses: Patient Active Problem List   Diagnosis Date Noted  . Hypoxia 10/12/2017  . ESRD on dialysis ([place]) 07/07/2016  . Chest pain 08/19/2015  . Coronary artery disease involving native heart with angina pectoris ([place]) 08/18/2015  . Expressive aphasia 02/03/2015  . Carotid stenosis 02/03/2015  . CVA (cerebral infarction) 01/31/2015  . Diabetes mellitus type 2 in obese ([place]) 01/31/2015  . End stage renal disease ([place]) 01/31/2015  . Mild intermittent asthma 03/29/2010  . CAD, ARTERY BYPASS GRAFT 03/02/2010  . Obesity hypoventilation syndrome ([place]) 02/05/2010  . Shortness of breath 12/24/2009  . CAROTID BRUIT 12/14/2009  . DIASTOLIC HEART FAILURE, CHRONIC 02/17/2009  . DIABETES MELLITUS, TYPE II 01/29/2009  . Hyperlipidemia 01/29/2009  . Obstructive sleep apnea 01/29/2009  . Essential hypertension 01/29/2009  . Aortic valve disorder 01/29/2009  . RENAL ARTERY STENOSIS 01/29/2009  . RENAL DISEASE, CHRONIC 01/29/2009    Orientation RESPIRATION BLADDER Height & Weight     Self, Place, Time  Normal Incontinent Weight: 230 lb 6.1 oz (104.5 kg) Height:  5\' 2"  (157.5 cm)  BEHAVIORAL SYMPTOMS/MOOD NEUROLOGICAL BOWEL NUTRITION STATUS  (none) (none) Continent Diet(renal carb modified)  AMBULATORY STATUS COMMUNICATION OF NEEDS  Skin   Extensive Assist Verbally Normal                       Personal Care Assistance Level of Assistance  Bathing, Dressing Bathing Assistance: Limited assistance   Dressing Assistance: Limited assistance     Functional Limitations Info  Hearing   Hearing Info: Impaired      SPECIAL CARE FACTORS FREQUENCY  PT (By licensed PT)(hemodialysis)                    Contractures Contractures Info: Not present    Additional Factors Info  Code Status, Allergies Code Status Info: full Allergies Info: ace inhibitors; codeine, metformin, clonidine, amlodipine           Current Medications (10/15/2017):  This is the current hospital active medication list Current Facility-Administered Medications  Medication Dose Route Frequency Provider Last Rate Last Dose  . acetaminophen (TYLENOL) tablet 650 mg  650 mg Oral Q6H PRN Harrie Foreman, MD       Or  . acetaminophen (TYLENOL) suppository 650 mg  650 mg Rectal Q6H PRN Harrie Foreman, MD      . aspirin EC tablet 81 mg  81 mg Oral Daily Harrie Foreman, MD   81 mg at 10/15/17 1033  . azithromycin (ZITHROMAX) tablet 250 mg  250 mg Oral Daily Harrie Foreman, MD   250 mg at 10/15/17 1033  . benzonatate (TESSALON) capsule 200 mg  200 mg Oral TID Bettey Costa, MD   200 mg at 10/15/17 1049  . carvedilol (COREG) tablet 25 mg  25  mg Oral 2 times per day on Sun Tue Thu Sat Bettey Costa, MD   25 mg at 10/15/17 1032  . carvedilol (COREG) tablet 25 mg  25 mg Oral Once per day on Mon Wed Fri Bettey Costa, MD   25 mg at 10/13/17 2151  . clopidogrel (PLAVIX) tablet 75 mg  75 mg Oral Daily Harrie Foreman, MD   75 mg at 10/15/17 1032  . docusate sodium (COLACE) capsule 100 mg  100 mg Oral BID Harrie Foreman, MD   100 mg at 10/15/17 1032  . FLUoxetine (PROZAC) capsule 20 mg  20 mg Oral Daily Harrie Foreman, MD   20 mg at 10/15/17 1032  . fluticasone (FLONASE) 50 MCG/ACT nasal spray 2 spray  2 spray Each Nare Daily Henreitta Leber, MD   2 spray at 10/15/17 1033  . furosemide (LASIX) injection 80 mg  80 mg Intravenous BID Murlean Iba, MD   80 mg at 10/15/17 1324  . guaiFENesin-dextromethorphan (ROBITUSSIN DM) 100-10 MG/5ML syrup 5 mL  5 mL Oral Q4H PRN Bettey Costa, MD   5 mL at 10/15/17 0513  . heparin injection 5,000 Units  5,000 Units Subcutaneous Q8H Harrie Foreman, MD   5,000 Units at 10/15/17 4010  . hydrALAZINE (APRESOLINE) tablet 50 mg  50 mg Oral BID Harrie Foreman, MD   50 mg at 10/15/17 1032  . insulin aspart (novoLOG) injection 0-9 Units  0-9 Units Subcutaneous TID WC Harrie Foreman, MD   1 Units at 10/14/17 1204  . insulin glargine (LANTUS) injection 18 Units  18 Units Subcutaneous QHS Harrie Foreman, MD   18 Units at 10/14/17 2208  . ipratropium-albuterol (DUONEB) 0.5-2.5 (3) MG/3ML nebulizer solution 3 mL  3 mL Nebulization Q6H PRN Demetrios Loll, MD   3 mL at 10/14/17 0818  . irbesartan (AVAPRO) tablet 300 mg  300 mg Oral Daily Harrie Foreman, MD   300 mg at 10/15/17 1032  . isosorbide mononitrate (IMDUR) 24 hr tablet 30 mg  30 mg Oral QHS Harrie Foreman, MD   30 mg at 10/14/17 2208  . levothyroxine (SYNTHROID, LEVOTHROID) tablet 75 mcg  75 mcg Oral QAC breakfast Harrie Foreman, MD   75 mcg at 10/15/17 2725  . multivitamin (RENA-VIT) tablet 1 tablet  1 tablet Oral QHS Harrie Foreman, MD   1 tablet at 10/14/17 2208  . nystatin (MYCOSTATIN/NYSTOP) topical powder 1 g  1 g Topical PRN Harrie Foreman, MD      . ondansetron [facility]) tablet 4 mg  4 mg Oral Q6H PRN Harrie Foreman, MD       Or  . ondansetron [facility]) injection 4 mg  4 mg Intravenous Q6H PRN Harrie Foreman, MD      . pseudoephedrine (SUDAFED) tablet 30 mg  30 mg Oral Q6H PRN Murlean Iba, MD   30 mg at 10/14/17 1231  . rosuvastatin (CRESTOR) tablet 40 mg  40 mg Oral QPM Harrie Foreman, MD   40 mg at 10/14/17 1759  . traZODone (DESYREL) tablet 50 mg  50 mg Oral QHS Harrie Foreman, MD   50 mg at  10/14/17 2208  . triamcinolone cream (KENALOG) 0.5 % 1 application  1 application Topical PRN Harrie Foreman, MD         Discharge Medications: Please see discharge summary for a list of discharge medications.  Relevant Imaging Results:  Relevant Lab Results:   Additional Information ss:  [id number]  Shela Leff, LCSW

## 2017-10-15 NOTE — Discharge Summary (Signed)
[facility] NAME: Dawn Bradford    MR#:  [id number]  DATE OF BIRTH:  June 03, 1936  DATE OF ADMISSION:  10/11/2017 ADMITTING PHYSICIAN: Harrie Foreman, MD  DATE OF DISCHARGE: 10/15/2017  PRIMARY CARE PHYSICIAN: Hortencia Pilar, MD    ADMISSION DIAGNOSIS:  Acute pulmonary edema ([place]) [J81.0] Hypoxia [R09.02]  DISCHARGE DIAGNOSIS:  Active Problems:   Hypoxia   SECONDARY DIAGNOSIS:   Past Medical History:  Diagnosis Date  . ACE-inhibitor cough   . Aortic valve disorder    Echo in 2009 showed mean aortic valve gradient of 13 mmHg, suggesting very mild stenosis. Echo (12/10) suggested aortic sclerosis only  . Carotid artery disease (HCC)    mild, carotid dopplers 12/2009  . CHF (congestive heart failure) ([place])   . COPD (chronic obstructive pulmonary disease) ([place])   . Coronary artery disease    s/p anterior MI in 1996 followed by CABG. Lexiscan myoview (4/11): EF 67%, normal perfusion with no evidence for ischemia or infarction.   . Diabetes mellitus    type 2  . Diastolic heart failure    most recent echo (12/10) showed EF 55-60% with mild LVH, grade I diastolic dysfunction, mild LAE.  [name] ESRD (end stage renal disease) on dialysis ([place])   . Hyperlipidemia   . Hypertension    resistant hyptertension times many years. the patient does have renal artery stenosis. she has tried calcium channel blockers in the past and states that she would not take them now because she had some problems with her gums which her dentist identified as calcium-channel blocker side effects.  . Hypothyroidism   . Mild asthma    PFT 02/05/10 FEV1 1.38, FEV1% 73, TLC 3.78 (86%), DLCO 48%, +BD  . Myocardial infarction ([place])   . Obesity   . Obesity hypoventilation syndrome ([place])       . Obstructive sleep apnea    PSG 01/05/2006 AHI 24.8, CPAP 9cm H2O  . Pulmonary nodule, right   . Renal artery stenosis [facility])    The patient has an occluded right renal artery  and an atrophic right kidney. There is 20% left renal artery stenosis. This was seen by catheterization in 2007  . Stroke [facility])     HOSPITAL COURSE:   82 year old female with a history of COPD,end-stage renal disease on hemodialysis and chronic diastolic heart failure who presented with shortness of breath  1. Acute hypoxic respiratory failure due to pulmonary edema. Patient missed dialysis last monday because she was sick. -Patient was given some IV Lasix and also see hemodialysis, her volume status is improved. She is no longer hypoxic and feels better and therefore is being discharged. Patient is being discharged to a skilled nursing facility for ongoing care.  2. End-stage renal disease on hemodialysis  -Patient received hemodialysis in the hospital for 2 days in a row. She has clinically improved. She will resume her dialysis on a Monday Wednesday Friday schedule.  3. Acute on chronic diastolic heart failure with fluid overload -Patient was given some intermittent Lasix, and also received urgent hemodialysis in the hospital and her volume status is improved. She is no longer volume overloaded or has any significant shortness of breath and therefore being discharged to the skilled nursing facility.  4. Essential hypertension: pt. Will Continue Coreg,Imdur, Avapro, Hydralazine.   5. Hypothyroidism - pt. Will Continue Synthroid  6. Hyperlipidemia: pt. Will Continue Crestor  7. Diabetes: Continue Lantus and Lispro with meals for now.   -  BS have remained stable while in the hospital.   8. Cough from fluid overload and possible bronchitis -Patient has improved with some Zithromax and will continue and finish her course. She will continue some Sudafed as needed for her congestion which is helping her.  9. Otitis Media - she will cont. Zithromax.  Pt. Is having some hearing loss issues and I advised daughter to follow up with ENT as outpatient.     10. Depression - pt. Will  cont. Prozac, Trazodone.   DISCHARGE CONDITIONS:   Stable.   CONSULTS OBTAINED:  Treatment Team:  Murlean Iba, MD  DRUG ALLERGIES:   Allergies  Allergen Reactions  . Ace Inhibitors Other (See Comments)    Reaction:  Unknown   . Amlodipine Other (See Comments)    Reaction:  Unknown   . Clonidine Hydrochloride Other (See Comments)    Reaction:  Unknown   . Codeine Hives  . Metformin Nausea And Vomiting    DISCHARGE MEDICATIONS:   Allergies as of 10/15/2017      Reactions   Ace Inhibitors Other (See Comments)   Reaction:  Unknown    Amlodipine Other (See Comments)   Reaction:  Unknown    Clonidine Hydrochloride Other (See Comments)   Reaction:  Unknown    Codeine Hives   Metformin Nausea And Vomiting      Medication List    TAKE these medications   acetaminophen 500 MG tablet Commonly known as:  TYLENOL Take 1,000 mg by mouth every 6 (six) hours as needed for mild pain.   albuterol 108 (90 Base) MCG/ACT inhaler Commonly known as:  PROVENTIL HFA;VENTOLIN HFA Inhale 2 puffs into the lungs every 6 (six) hours as needed for wheezing or shortness of breath.   aspirin EC 81 MG tablet Take 81 mg by mouth daily.   azithromycin 250 MG tablet Commonly known as:  ZITHROMAX Take 1 tablet (250 mg total) by mouth daily for 4 days. What changed:    how much to take  how to take this  when to take this  additional instructions   b complex-vitamin c-folic acid 0.8 MG Tabs tablet Take 1 tablet by mouth daily.   carvedilol 25 MG tablet Commonly known as:  COREG Take 25 mg by mouth 2 (two) times daily.   clopidogrel 75 MG tablet Commonly known as:  PLAVIX Take 1 tablet (75 mg total) by mouth daily.   ferrous fumarate-iron polysaccharide complex 162-115.2 MG Caps capsule Commonly known as:  TANDEM Take 1 capsule by mouth daily.   FIFTY50 PEN NEEDLES 32G X 6 MM Misc Generic drug:  Insulin Pen Needle Use as directed.   FLUoxetine 20 MG tablet Commonly  known as:  PROZAC Take 20 mg by mouth daily.   furosemide 40 MG tablet Commonly known as:  LASIX Take 20-80 mg by mouth. Sunday, Tuesday and Saturday pt takes 80 mg in the morning. Thursdays 20 mg only. Tuesdays pt may take lower dose.   gabapentin 100 MG capsule Commonly known as:  NEURONTIN 100 mg.   hydrALAZINE 50 MG tablet Commonly known as:  APRESOLINE Take 50 mg by mouth 2 (two) times daily.   insulin glargine 100 UNIT/ML injection Commonly known as:  LANTUS Inject 30 Units into the skin at bedtime.   insulin lispro 100 UNIT/ML injection Commonly known as:  HUMALOG Inject 20 Units into the skin as needed for high blood sugar.   IRON PO Take by mouth.   isosorbide mononitrate 30 MG 24  hr tablet Commonly known as:  IMDUR Take 30 mg by mouth at bedtime.   lactulose 10 GM/15ML solution Commonly known as:  [place] rectally.   levothyroxine 75 MCG tablet Commonly known as:  SYNTHROID, LEVOTHROID Take 75 mcg by mouth daily.   lidocaine-prilocaine cream Commonly known as:  EMLA Apply 1 application topically as needed (for pain).   nystatin powder Generic drug:  nystatin Apply 1 g topically as needed (for irritation). Reported on 01/28/2016   pseudoephedrine 30 MG tablet Commonly known as:  SUDAFED Take 1 tablet (30 mg total) by mouth every 6 (six) hours as needed for congestion.   rosuvastatin 40 MG tablet Commonly known as:  CRESTOR Take 40 mg by mouth every evening.   telmisartan 80 MG tablet Commonly known as:  MICARDIS Take 80 mg by mouth daily. At night   traZODone 50 MG tablet Commonly known as:  DESYREL Take 50 mg by mouth at bedtime.   triamcinolone cream 0.5 % Commonly known as:  KENALOG Apply 1 application topically as needed (for irritation).   Vitamin D3 5000 units Tabs Take 5,000 Units by mouth daily.         DISCHARGE INSTRUCTIONS:   DIET:  Cardiac diet and Diabetic diet  DISCHARGE CONDITION:  Stable  ACTIVITY:   Activity as tolerated  OXYGEN:  Home Oxygen: No.   Oxygen Delivery: room air  DISCHARGE LOCATION:  nursing home   If you experience worsening of your admission symptoms, develop shortness of breath, life threatening emergency, suicidal or homicidal thoughts you must seek medical attention immediately by calling 911 or calling your MD immediately  if symptoms less severe.  You Must read complete instructions/literature along with all the possible adverse reactions/side effects for all the Medicines you take and that have been prescribed to you. Take any new Medicines after you have completely understood and accpet all the possible adverse reactions/side effects.   Please note  You were cared for by a hospitalist during your hospital stay. If you have any questions about your discharge medications or the care you received while you were in the hospital after you are discharged, you can call the unit and asked to speak with the hospitalist on call if the hospitalist that took care of you is not available. Once you are discharged, your primary care physician will handle any further medical issues. Please note that NO REFILLS for any discharge medications will be authorized once you are discharged, as it is imperative that you return to your primary care physician (or establish a relationship with a primary care physician if you do not have one) for your aftercare needs so that they can reassess your need for medications and monitor your lab values.     Today    VITAL SIGNS:  Blood pressure (!) 135/56, pulse 72, temperature 98.1 F (36.7 C), temperature source Oral, resp. rate 19, height 5\' 2"  (1.575 m), weight 104.5 kg (230 lb 6.1 oz), last menstrual period 02/01/1986, SpO2 96 %.  I/O:    Intake/Output Summary (Last 24 hours) at 10/15/2017 1252 Last data filed at 10/15/2017 1216 Gross per 24 hour  Intake 720 ml  Output 1900 ml  Net -1180 ml    PHYSICAL EXAMINATION:   GENERAL:  82  y.o.-year-old patient lying in bed in no acute distress.  EYES: Pupils equal, round, reactive to light and accommodation. No scleral icterus. Extraocular muscles intact.  HEENT: Head atraumatic, normocephalic. Oropharynx and nasopharynx clear.  Mild Left middle Ear  inflammation with fluid noted. NECK:  Supple, no jugular venous distention. No thyroid enlargement, no tenderness.  LUNGS: Normal breath sounds bilaterally, no wheezing, rales, rhonchi. No use of accessory muscles of respiration.  CARDIOVASCULAR: S1, S2 normal. No murmurs, rubs, or gallops.  ABDOMEN: Soft, nontender, nondistended. Bowel sounds present. No organomegaly or mass.  EXTREMITIES: No cyanosis, clubbing, +1-2 edema b/l.    NEUROLOGIC: Cranial nerves II through XII are intact. No focal Motor or sensory deficits b/l.  Globally weak.  PSYCHIATRIC: The patient is alert and oriented x 3.  SKIN: No obvious rash, lesion, or ulcer.   Left upper Ext. AV fistula with good bruit/thrill.   DATA REVIEW:   CBC Recent Labs  Lab 10/11/17 2315  WBC 10.7  HGB 10.5*  HCT 30.7*  PLT 128*    Chemistries  Recent Labs  Lab 10/11/17 2315  NA 131*  K 3.6  CL 96*  CO2 24  GLUCOSE 130*  BUN 26*  CREATININE 2.45*  CALCIUM 8.4*  AST 36  ALT 17  ALKPHOS 73  BILITOT 1.0    Cardiac Enzymes Recent Labs  Lab 10/11/17 2315  TROPONINI <0.03    Microbiology Results  Results for orders placed or performed during the hospital encounter of 10/11/17  MRSA PCR Screening     Status: None   Collection Time: 10/14/17  5:30 PM  Result Value Ref Range Status   MRSA by PCR NEGATIVE NEGATIVE Final    Comment:        The GeneXpert MRSA Assay (FDA approved for NASAL specimens only), is one component of a comprehensive MRSA colonization surveillance program. It is not intended to diagnose MRSA infection nor to guide or monitor treatment for MRSA infections. Performed at [facility], [address] [place], [place]  [address]     RADIOLOGY:  Dg Chest Port 1 View  Result Date: 10/14/2017 CLINICAL DATA:  Shortness of breath and cough for the past several weeks. EXAM: PORTABLE CHEST 1 VIEW COMPARISON:  Chest x-ray from yesterday. FINDINGS: Stable cardiomediastinal silhouette status post CABG. Mild persistent interstitial edema, similar to prior study. No focal consolidation, pleural effusion, or pneumothorax. No acute osseous abnormality. IMPRESSION: Persistent mild interstitial edema. Electronically Signed   By: Titus Dubin M.D.   On: 10/14/2017 07:55      Management plans discussed with the patient, family and they are in agreement.  CODE STATUS:     Code Status Orders  (From admission, onward)        Start     Ordered   10/12/17 0543  Full code  Continuous     10/12/17 0542  Advance Directive Documentation     Most Recent Value  Type of Advance Directive  Living will [does not know]  Pre-existing out of facility DNR order (yellow form or pink MOST form)  No data  "MOST" Form in Place?  No data      TOTAL TIME TAKING CARE OF THIS PATIENT: 40 minutes.    Henreitta Leber M.D on 10/15/2017 at 12:52 PM  Between 7am to 6pm - Pager - [phone]  After 6pm go to [email or web address] - Proofreader  [facility]  Office  [phone]  CC: Primary care physician; Hortencia Pilar, MD

## 2017-10-15 NOTE — Progress Notes (Signed)
[facility], [place] 10/15/17  Subjective:   3.8 L of fluid has been removed with HD so far Breathing is better but not back to baseline Reports hearing loss in left ear Cough and wheezing are better Patient did not sleep well last night  Objective:  Vital signs in last 24 hours:  Temp:  [98.1 F (36.7 C)-98.8 F (37.1 C)] 98.1 F (36.7 C) (02/10 0538) Pulse Rate:  [67-81] 72 (02/10 0822) Resp:  [17-19] 19 (02/10 0538) BP: (135-145)/(52-76) 135/56 (02/10 1032) SpO2:  [93 %-96 %] 96 % (02/10 0701)  Weight change:  Filed Weights   10/12/17 1215 10/13/17 1350 10/13/17 1724  Weight: 101 kg (222 lb 10.6 oz) 106 kg (233 lb 11 oz) 104.5 kg (230 lb 6.1 oz)    Intake/Output:    Intake/Output Summary (Last 24 hours) at 10/15/2017 1331 Last data filed at 10/15/2017 1216 Gross per 24 hour  Intake 720 ml  Output 1900 ml  Net -1180 ml     Physical Exam: General:  No acute distress, laying in the bed   HEENT  anicteric, decreased hearing  Neck  supple  Pulm/lungs   mild crackles, normal respiratory effort today  CVS/Heart  regular rhythm, no rub or gallop  Abdomen:   Soft, nontender  Extremities:  Trace edema, SCDs in place  Neurologic:  Alert, oriented  Skin:  No acute rashes  Access:  AV fistula, left forearm       Basic Metabolic Panel:  Recent Labs  Lab 10/11/17 2315  NA 131*  K 3.6  CL 96*  CO2 24  GLUCOSE 130*  BUN 26*  CREATININE 2.45*  CALCIUM 8.4*     CBC: Recent Labs  Lab 10/11/17 2315  WBC 10.7  HGB 10.5*  HCT 30.7*  MCV 103.7*  PLT 128*      Lab Results  Component Value Date   HEPBSAG Negative 08/20/2015      Microbiology:  Recent Results (from the past 240 hour(s))  MRSA PCR Screening     Status: None   Collection Time: 10/14/17  5:30 PM  Result Value Ref Range Status   MRSA by PCR NEGATIVE NEGATIVE Final    Comment:        The GeneXpert MRSA Assay (FDA approved for NASAL specimens only), is one  component of a comprehensive MRSA colonization surveillance program. It is not intended to diagnose MRSA infection nor to guide or monitor treatment for MRSA infections. Performed at [facility], [address] [place], [place] [address]     Coagulation Studies: No results for input(s): LABPROT, INR in the last 72 hours.  Urinalysis: No results for input(s): COLORURINE, LABSPEC, PHURINE, GLUCOSEU, HGBUR, BILIRUBINUR, KETONESUR, PROTEINUR, UROBILINOGEN, NITRITE, LEUKOCYTESUR in the last 72 hours.  Invalid input(s): APPERANCEUR    Imaging: Dg Chest Port 1 View  Result Date: 10/14/2017 CLINICAL DATA:  Shortness of breath and cough for the past several weeks. EXAM: PORTABLE CHEST 1 VIEW COMPARISON:  Chest x-ray from yesterday. FINDINGS: Stable cardiomediastinal silhouette status post CABG. Mild persistent interstitial edema, similar to prior study. No focal consolidation, pleural effusion, or pneumothorax. No acute osseous abnormality. IMPRESSION: Persistent mild interstitial edema. Electronically Signed   By: Titus Dubin M.D.   On: 10/14/2017 07:55     Medications:    . aspirin EC  81 mg Oral Daily  . azithromycin  250 mg Oral Daily  . benzonatate  200 mg Oral TID  . carvedilol  25 mg  Oral 2 times per day on Sun Tue Thu Sat  . carvedilol  25 mg Oral Once per day on Mon Wed Fri  . clopidogrel  75 mg Oral Daily  . docusate sodium  100 mg Oral BID  . FLUoxetine  20 mg Oral Daily  . fluticasone  2 spray Each Nare Daily  . furosemide  80 mg Intravenous BID  . heparin  5,000 Units Subcutaneous Q8H  . hydrALAZINE  50 mg Oral BID  . insulin aspart  0-9 Units Subcutaneous TID WC  . insulin glargine  18 Units Subcutaneous QHS  . irbesartan  300 mg Oral Daily  . isosorbide mononitrate  30 mg Oral QHS  . levothyroxine  75 mcg Oral QAC breakfast  . multivitamin  1 tablet Oral QHS  . rosuvastatin  40 mg Oral QPM  . traZODone  50 mg Oral QHS   acetaminophen **OR**  acetaminophen, guaiFENesin-dextromethorphan, ipratropium-albuterol, nystatin, ondansetron **OR** ondansetron (ZOFRAN) IV, pseudoephedrine, triamcinolone cream  Assessment/ Plan:  82 y.o. female with end-stage renal disease, coronary disease, carotid stenosis, history of stroke, diabetes, CABG, obstructive sleep apnea, history of renal artery stenosis  [facility]/MWF/ [facility]/ 102 kg//195 min  1.  End-stage renal disease 2.  Acute pulmonary edema 3.  Anemia of chronic kidney disease 4.  Secondary hyperparathyroidism  Plan: - Total 3.8 L has been removed with HD  - We will continue Epogen with dialysis MWF schedule  -  she will finish Zithromax for bronchitis - Patient is being discharged to skilled nursing facility  - we will follow-up with her In outpatient dialysis      LOS: [place] 2/10/20191:31 PM  [place], [place]

## 2017-10-15 NOTE — Progress Notes (Signed)
Dawn Bradford  A and O x 4. VSS. Pt tolerating diet well. No complaints of pain or nausea. IV removed intact, prescriptions given. Pt's daughter voiced understanding of discharge instructions with no further questions. Pt discharged via EMS with. Report called to [place] at [facility] resources.   Lynann Bologna MSN, RN-BC  Allergies as of 10/15/2017      Reactions   Ace Inhibitors Other (See Comments)   Reaction:  Unknown    Amlodipine Other (See Comments)   Reaction:  Unknown    Clonidine Hydrochloride Other (See Comments)   Reaction:  Unknown    Codeine Hives   Metformin Nausea And Vomiting      Medication List    TAKE these medications   acetaminophen 500 MG tablet Commonly known as:  TYLENOL Take 1,000 mg by mouth every 6 (six) hours as needed for mild pain.   albuterol 108 (90 Base) MCG/ACT inhaler Commonly known as:  PROVENTIL HFA;VENTOLIN HFA Inhale 2 puffs into the lungs every 6 (six) hours as needed for wheezing or shortness of breath.   aspirin EC 81 MG tablet Take 81 mg by mouth daily.   azithromycin 250 MG tablet Commonly known as:  ZITHROMAX Take 1 tablet (250 mg total) by mouth daily for 4 days. What changed:    how much to take  how to take this  when to take this  additional instructions   b complex-vitamin c-folic acid 0.8 MG Tabs tablet Take 1 tablet by mouth daily.   carvedilol 25 MG tablet Commonly known as:  COREG Take 25 mg by mouth 2 (two) times daily.   clopidogrel 75 MG tablet Commonly known as:  PLAVIX Take 1 tablet (75 mg total) by mouth daily.   ferrous fumarate-iron polysaccharide complex 162-115.2 MG Caps capsule Commonly known as:  TANDEM Take 1 capsule by mouth daily.   FIFTY50 PEN NEEDLES 32G X 6 MM Misc Generic drug:  Insulin Pen Needle Use as directed.   FLUoxetine 20 MG tablet Commonly known as:  PROZAC Take 20 mg by mouth daily.   furosemide 40 MG tablet Commonly known as:  LASIX Take 20-80 mg by mouth. Sunday, Tuesday  and Saturday pt takes 80 mg in the morning. Thursdays 20 mg only. Tuesdays pt may take lower dose.   gabapentin 100 MG capsule Commonly known as:  NEURONTIN 100 mg.   hydrALAZINE 50 MG tablet Commonly known as:  APRESOLINE Take 50 mg by mouth 2 (two) times daily.   insulin glargine 100 UNIT/ML injection Commonly known as:  LANTUS Inject 30 Units into the skin at bedtime.   insulin lispro 100 UNIT/ML injection Commonly known as:  HUMALOG Inject 20 Units into the skin as needed for high blood sugar.   IRON PO Take by mouth.   isosorbide mononitrate 30 MG 24 hr tablet Commonly known as:  IMDUR Take 30 mg by mouth at bedtime.   lactulose 10 GM/15ML solution Commonly known as:  [place] Place rectally.   levothyroxine 75 MCG tablet Commonly known as:  SYNTHROID, LEVOTHROID Take 75 mcg by mouth daily.   lidocaine-prilocaine cream Commonly known as:  EMLA Apply 1 application topically as needed (for pain).   nystatin powder Generic drug:  nystatin Apply 1 g topically as needed (for irritation). Reported on 01/28/2016   pseudoephedrine 30 MG tablet Commonly known as:  SUDAFED Take 1 tablet (30 mg total) by mouth every 6 (six) hours as needed for congestion.   rosuvastatin 40 MG tablet Commonly known  as:  CRESTOR Take 40 mg by mouth every evening.   telmisartan 80 MG tablet Commonly known as:  MICARDIS Take 80 mg by mouth daily. At night   traZODone 50 MG tablet Commonly known as:  DESYREL Take 50 mg by mouth at bedtime.   triamcinolone cream 0.5 % Commonly known as:  KENALOG Apply 1 application topically as needed (for irritation).   Vitamin D3 5000 units Tabs Take 5,000 Units by mouth daily.       Vitals:   10/15/17 1032 10/15/17 1343  BP: (!) 135/56 (!) 111/46  Pulse:    Resp:  (!) 22  Temp:  98.2 F (36.8 C)  SpO2:  97%

## 2017-10-20 ENCOUNTER — Other Ambulatory Visit
Admission: RE | Admit: 2017-10-20 | Discharge: 2017-10-20 | Disposition: A | Discharge status: Home / Self Care | Payer: Medicare Other | Source: Other Acute Inpatient Hospital | Attending: Family Medicine | Admitting: Family Medicine

## 2017-10-20 DIAGNOSIS — I5032 Chronic diastolic (congestive) heart failure: Secondary | ICD-10-CM | POA: Insufficient documentation

## 2017-10-20 LAB — BRAIN NATRIURETIC PEPTIDE: B Natriuretic Peptide: 264 pg/mL — ABNORMAL HIGH (ref 0.0–100.0)

## 2017-10-21 ENCOUNTER — Other Ambulatory Visit
Admission: RE | Admit: 2017-10-21 | Discharge: 2017-10-21 | Disposition: A | Discharge status: Home / Self Care | Payer: Medicare Other | Source: Skilled Nursing Facility | Attending: Family Medicine | Admitting: Family Medicine

## 2017-10-21 DIAGNOSIS — I5032 Chronic diastolic (congestive) heart failure: Secondary | ICD-10-CM | POA: Diagnosis present

## 2017-10-21 LAB — BRAIN NATRIURETIC PEPTIDE: B Natriuretic Peptide: 560 pg/mL — ABNORMAL HIGH (ref 0.0–100.0)

## 2017-10-24 ENCOUNTER — Other Ambulatory Visit
Admission: RE | Admit: 2017-10-24 | Discharge: 2017-10-24 | Disposition: A | Discharge status: Home / Self Care | Payer: Medicare Other | Source: Ambulatory Visit | Attending: Family Medicine | Admitting: Family Medicine

## 2017-10-24 DIAGNOSIS — I5032 Chronic diastolic (congestive) heart failure: Secondary | ICD-10-CM | POA: Diagnosis present

## 2017-10-24 LAB — BRAIN NATRIURETIC PEPTIDE: B Natriuretic Peptide: 188 pg/mL — ABNORMAL HIGH (ref 0.0–100.0)

## 2017-10-26 ENCOUNTER — Ambulatory Visit: Payer: Medicare Other | Admitting: Podiatry

## 2017-12-26 ENCOUNTER — Other Ambulatory Visit: Payer: Self-pay | Admitting: Family Medicine

## 2017-12-26 DIAGNOSIS — Z780 Asymptomatic menopausal state: Secondary | ICD-10-CM

## 2018-01-16 ENCOUNTER — Encounter (INDEPENDENT_AMBULATORY_CARE_PROVIDER_SITE_OTHER): Payer: Self-pay

## 2018-01-22 ENCOUNTER — Other Ambulatory Visit (INDEPENDENT_AMBULATORY_CARE_PROVIDER_SITE_OTHER): Payer: Self-pay | Admitting: Vascular Surgery

## 2018-01-24 MED ORDER — CEFAZOLIN SODIUM-DEXTROSE 1-4 GM/50ML-% IV SOLN
1.0000 g | Freq: Once | INTRAVENOUS | Status: AC
  Administered 2018-01-25: 1 g via INTRAVENOUS

## 2018-01-25 ENCOUNTER — Ambulatory Visit
Admission: RE | Admit: 2018-01-25 | Discharge: 2018-01-25 | Disposition: A | Discharge status: Home / Self Care | Payer: Medicare Other | Source: Ambulatory Visit | Attending: Vascular Surgery | Admitting: Vascular Surgery

## 2018-01-25 ENCOUNTER — Encounter
Admission: RE | Disposition: A | Discharge status: Home / Self Care | Payer: Self-pay | Source: Ambulatory Visit | Attending: Vascular Surgery

## 2018-01-25 DIAGNOSIS — I5032 Chronic diastolic (congestive) heart failure: Secondary | ICD-10-CM | POA: Diagnosis not present

## 2018-01-25 DIAGNOSIS — Z951 Presence of aortocoronary bypass graft: Secondary | ICD-10-CM | POA: Insufficient documentation

## 2018-01-25 DIAGNOSIS — Z833 Family history of diabetes mellitus: Secondary | ICD-10-CM | POA: Insufficient documentation

## 2018-01-25 DIAGNOSIS — Z885 Allergy status to narcotic agent status: Secondary | ICD-10-CM | POA: Insufficient documentation

## 2018-01-25 DIAGNOSIS — I2510 Atherosclerotic heart disease of native coronary artery without angina pectoris: Secondary | ICD-10-CM | POA: Insufficient documentation

## 2018-01-25 DIAGNOSIS — Z9849 Cataract extraction status, unspecified eye: Secondary | ICD-10-CM | POA: Diagnosis not present

## 2018-01-25 DIAGNOSIS — Z888 Allergy status to other drugs, medicaments and biological substances status: Secondary | ICD-10-CM | POA: Insufficient documentation

## 2018-01-25 DIAGNOSIS — E039 Hypothyroidism, unspecified: Secondary | ICD-10-CM | POA: Diagnosis not present

## 2018-01-25 DIAGNOSIS — I132 Hypertensive heart and chronic kidney disease with heart failure and with stage 5 chronic kidney disease, or end stage renal disease: Secondary | ICD-10-CM | POA: Diagnosis not present

## 2018-01-25 DIAGNOSIS — Z6838 Body mass index (BMI) 38.0-38.9, adult: Secondary | ICD-10-CM | POA: Insufficient documentation

## 2018-01-25 DIAGNOSIS — I252 Old myocardial infarction: Secondary | ICD-10-CM | POA: Diagnosis not present

## 2018-01-25 DIAGNOSIS — E1122 Type 2 diabetes mellitus with diabetic chronic kidney disease: Secondary | ICD-10-CM | POA: Diagnosis not present

## 2018-01-25 DIAGNOSIS — E11649 Type 2 diabetes mellitus with hypoglycemia without coma: Secondary | ICD-10-CM | POA: Diagnosis not present

## 2018-01-25 DIAGNOSIS — E785 Hyperlipidemia, unspecified: Secondary | ICD-10-CM | POA: Diagnosis not present

## 2018-01-25 DIAGNOSIS — Z8673 Personal history of transient ischemic attack (TIA), and cerebral infarction without residual deficits: Secondary | ICD-10-CM | POA: Diagnosis not present

## 2018-01-25 DIAGNOSIS — Z992 Dependence on renal dialysis: Secondary | ICD-10-CM | POA: Insufficient documentation

## 2018-01-25 DIAGNOSIS — J449 Chronic obstructive pulmonary disease, unspecified: Secondary | ICD-10-CM | POA: Insufficient documentation

## 2018-01-25 DIAGNOSIS — Z8249 Family history of ischemic heart disease and other diseases of the circulatory system: Secondary | ICD-10-CM | POA: Insufficient documentation

## 2018-01-25 DIAGNOSIS — Z841 Family history of disorders of kidney and ureter: Secondary | ICD-10-CM | POA: Insufficient documentation

## 2018-01-25 DIAGNOSIS — Y832 Surgical operation with anastomosis, bypass or graft as the cause of abnormal reaction of the patient, or of later complication, without mention of misadventure at the time of the procedure: Secondary | ICD-10-CM | POA: Diagnosis not present

## 2018-01-25 DIAGNOSIS — E669 Obesity, unspecified: Secondary | ICD-10-CM | POA: Insufficient documentation

## 2018-01-25 DIAGNOSIS — G4733 Obstructive sleep apnea (adult) (pediatric): Secondary | ICD-10-CM | POA: Diagnosis not present

## 2018-01-25 DIAGNOSIS — Z9889 Other specified postprocedural states: Secondary | ICD-10-CM | POA: Diagnosis not present

## 2018-01-25 DIAGNOSIS — N186 End stage renal disease: Secondary | ICD-10-CM

## 2018-01-25 DIAGNOSIS — T82858A Stenosis of vascular prosthetic devices, implants and grafts, initial encounter: Secondary | ICD-10-CM | POA: Insufficient documentation

## 2018-01-25 DIAGNOSIS — T82868A Thrombosis of vascular prosthetic devices, implants and grafts, initial encounter: Secondary | ICD-10-CM | POA: Diagnosis not present

## 2018-01-25 DIAGNOSIS — Z90710 Acquired absence of both cervix and uterus: Secondary | ICD-10-CM | POA: Insufficient documentation

## 2018-01-25 HISTORY — PX: A/V FISTULAGRAM: CATH118298

## 2018-01-25 LAB — GLUCOSE, CAPILLARY: Glucose-Capillary: 122 mg/dL — ABNORMAL HIGH (ref 65–99)

## 2018-01-25 LAB — POTASSIUM (ARMC VASCULAR LAB ONLY): Potassium (ARMC vascular lab): 3.6 (ref 3.5–5.1)

## 2018-01-25 SURGERY — A/V FISTULAGRAM
Anesthesia: Moderate Sedation

## 2018-01-25 MED ORDER — CEFAZOLIN SODIUM-DEXTROSE 1-4 GM/50ML-% IV SOLN
INTRAVENOUS | Status: AC
  Administered 2018-01-25: 1 g via INTRAVENOUS
  Filled 2018-01-25: qty 50

## 2018-01-25 MED ORDER — SODIUM CHLORIDE 0.9 % IV SOLN
INTRAVENOUS | Status: DC
  Administered 2018-01-25: 10:00:00 via INTRAVENOUS

## 2018-01-25 MED ORDER — MIDAZOLAM HCL 2 MG/2ML IJ SOLN
INTRAMUSCULAR | Status: DC | PRN
  Administered 2018-01-25: 2 mg via INTRAVENOUS

## 2018-01-25 MED ORDER — FENTANYL CITRATE (PF) 100 MCG/2ML IJ SOLN
INTRAMUSCULAR | Status: DC | PRN
  Administered 2018-01-25: 50 ug via INTRAVENOUS

## 2018-01-25 MED ORDER — HEPARIN (PORCINE) IN NACL 1000-0.9 UT/500ML-% IV SOLN
INTRAVENOUS | Status: AC
  Filled 2018-01-25: qty 1000

## 2018-01-25 MED ORDER — IOPAMIDOL (ISOVUE-300) INJECTION 61%
INTRAVENOUS | Status: DC | PRN
  Administered 2018-01-25: 30 mL via INTRA_ARTERIAL

## 2018-01-25 MED ORDER — HYDROMORPHONE HCL 1 MG/ML IJ SOLN: 1.0000 mg | Freq: Once | INTRAMUSCULAR | Status: DC | PRN

## 2018-01-25 MED ORDER — ONDANSETRON HCL 4 MG/2ML IJ SOLN: 4.0000 mg | Freq: Four times a day (QID) | INTRAMUSCULAR | Status: DC | PRN

## 2018-01-25 MED ORDER — MIDAZOLAM HCL 5 MG/5ML IJ SOLN
INTRAMUSCULAR | Status: AC
  Filled 2018-01-25: qty 5

## 2018-01-25 MED ORDER — FAMOTIDINE 20 MG PO TABS: 40.0000 mg | ORAL_TABLET | ORAL | Status: DC | PRN

## 2018-01-25 MED ORDER — HEPARIN SODIUM (PORCINE) 1000 UNIT/ML IJ SOLN
INTRAMUSCULAR | Status: DC | PRN
  Administered 2018-01-25: 3000 [IU] via INTRAVENOUS

## 2018-01-25 MED ORDER — FENTANYL CITRATE (PF) 100 MCG/2ML IJ SOLN
INTRAMUSCULAR | Status: AC
  Filled 2018-01-25: qty 2

## 2018-01-25 MED ORDER — HEPARIN SODIUM (PORCINE) 1000 UNIT/ML IJ SOLN
INTRAMUSCULAR | Status: AC
  Filled 2018-01-25: qty 1

## 2018-01-25 MED ORDER — METHYLPREDNISOLONE SODIUM SUCC 125 MG IJ SOLR: 125.0000 mg | INTRAMUSCULAR | Status: DC | PRN

## 2018-01-25 SURGICAL SUPPLY — 11 items
BALLN LUTONIX DCB 6X40X130 (BALLOONS) ×3
BALLOON LUTONIX DCB 6X40X130 (BALLOONS) ×1 IMPLANT
CANNULA 5F STIFF (CANNULA) ×3 IMPLANT
CATH BEACON 5 .035 65 KMP TIP (CATHETERS) ×3 IMPLANT
DERMABOND ADVANCED (GAUZE/BANDAGES/DRESSINGS) ×2
DERMABOND ADVANCED .7 DNX12 (GAUZE/BANDAGES/DRESSINGS) ×1 IMPLANT
DEVICE PRESTO INFLATION (MISCELLANEOUS) ×3 IMPLANT
PACK ANGIOGRAPHY (CUSTOM PROCEDURE TRAY) ×3 IMPLANT
SHEATH BRITE TIP 6FRX5.5 (SHEATH) ×3 IMPLANT
SUT MNCRL AB 4-0 PS2 18 (SUTURE) ×3 IMPLANT
WIRE MAGIC TOR.035 180C (WIRE) ×3 IMPLANT

## 2018-01-25 NOTE — Op Note (Signed)
[facility]    OPERATIVE NOTE   PROCEDURE: 1.   Left radiocephalic arteriovenous fistula cannulation under ultrasound guidance 2.   Left arm fistulagram including central venogram 3.   Percutaneous transluminal angioplasty of perianastomotic cephalic vein with 6 mm diameter by 4 cm length Lutonix drug-coated angioplasty balloon  PRE-OPERATIVE DIAGNOSIS: 1. ESRD 2. Poorly functional left radiocephalic AVF  POST-OPERATIVE DIAGNOSIS: same as above   SURGEON: Leotis Pain, MD  ANESTHESIA: local with MCS  ESTIMATED BLOOD LOSS: 5 cc  FINDING(S): 1. Moderate stenosis between the previously placed stent in the forearm cephalic vein and the radiocephalic anastomosis in the 60 to 65% range.  The stent was patent with only mild restenosis of 20 to 30%.  There was dual outflow in the upper arm between the basilic and the cephalic vein.  There was no significant stenosis in the upper arm.  The central veins were widely patent.  SPECIMEN(S):  None  CONTRAST: 30 cc  FLUORO TIME: 0.9 minutes  MODERATE CONSCIOUS SEDATION TIME: Approximately 20 minutes with 2 mg of Versed and 50 Mcg of Fentanyl   INDICATIONS: Dawn Bradford is a 82 y.o. female who presents with malfunctioning right radiocephalic arteriovenous fistula.  The patient is scheduled for left arm fistulagram.  The patient is aware the risks include but are not limited to: bleeding, infection, thrombosis of the cannulated access, and possible anaphylactic reaction to the contrast.  The patient is aware of the risks of the procedure and elects to proceed forward.  DESCRIPTION: After full informed written consent was obtained, the patient was brought back to the angiography suite and placed supine upon the angiography table.  The patient was connected to monitoring equipment. Moderate conscious sedation was administered with a face to face encounter with the patient throughout the procedure with my supervision of the RN  administering medicines and monitoring the patient's vital signs and mental status throughout from the start of the procedure until the patient was taken to the recovery room. The left arm was prepped and draped in the standard fashion for a percutaneous access intervention.  Under ultrasound guidance, the left radiocephalic arteriovenous fistula was cannulated with a micropuncture needle under direct ultrasound guidance in a retrograde fashion near the venous access site and a permanent image was performed.  The microwire was advanced into the fistula and the needle was exchanged for the a microsheath.  I then upsized to a 6 Fr Sheath and imaging was performed.  Hand injections were completed to image the access including the central venous system. This demonstrated stenosis between the previously placed stent in the forearm cephalic vein and the radiocephalic anastomosis in the 60 to 65% range.  The stent was patent with only mild restenosis of 20 to 30%.  There was dual outflow in the upper arm between the basilic and the cephalic vein.  There was no significant stenosis in the upper arm.  The central veins were widely patent.  Based on the images, this patient will need intervention to the perianastomotic stenosis. I then gave the patient 3000 units of intravenous heparin.  I then crossed the stenosis with a Magic Tourqe wire.  Based on the imaging, a 6 mm x 4 cm Lutonix drug-coated angioplasty balloon was selected.  The balloon was centered around the perianastomotic cephalic vein stenosis and inflated to 12 ATM for 1 minute(s).  On completion imaging, a 20-25 % residual stenosis was present.     Based on the completion imaging,  no further intervention is necessary.  The wire and balloon were removed from the sheath.  A 4-0 Monocryl purse-string suture was sewn around the sheath.  The sheath was removed while tying down the suture.  A sterile bandage was applied to the puncture site.  COMPLICATIONS:  None  CONDITION: Stable   Leotis Pain  01/25/2018 12:14 PM   This note was created with Dragon Medical transcription system. Any errors in dictation are purely unintentional.

## 2018-01-25 NOTE — H&P (Signed)
[facility] Admission History & Physical  MRN : [id number]  Dawn Bradford is a 82 y.o. (1936-08-09) female who presents with chief complaint of No chief complaint on file. [name]  History of Present Illness: I am asked to evaluate the patient by the dialysis center. The patient was sent here because they were unable to achieve adequate dialysis this morning. Furthermore the Center states there is very poor thrill and bruit. The patient states there there have been increasing problems with the access, such as "pulling clots" during dialysis and prolonged bleeding after decannulation. The patient estimates these problems have been going on for several weeks. The patient is unaware of any other change.  Patient denies pain or tenderness overlying the access.  There is no pain with dialysis.  The patient denies hand pain or finger pain consistent with steal syndrome.   There have not been any past interventions or declots of this access in a couple of years.  The patient is not chronically hypotensive on dialysis.  Current Facility-Administered Medications  Medication Dose Route Frequency Provider Last Rate Last Dose  . 0.9 %  sodium chloride infusion   Intravenous Continuous Stegmayer, Kimberly A, PA-C      . ceFAZolin (ANCEF) 1-4 GM/50ML-% IVPB           . ceFAZolin (ANCEF) IVPB 1 g/50 mL premix  1 g Intravenous Once Stegmayer, Kimberly A, PA-C      . famotidine (PEPCID) tablet 40 mg  40 mg Oral PRN Stegmayer, Janalyn Harder, PA-C      . HYDROmorphone (DILAUDID) injection 1 mg  1 mg Intravenous Once PRN Stegmayer, Kimberly A, PA-C      . methylPREDNISolone sodium succinate (SOLU-MEDROL) 125 mg/2 mL injection 125 mg  125 mg Intravenous PRN Stegmayer, Kimberly A, PA-C      . ondansetron (ZOFRAN) injection 4 mg  4 mg Intravenous Q6H PRN Stegmayer, Janalyn Harder, PA-C        Past Medical History:  Diagnosis Date  . ACE-inhibitor cough   . Aortic valve disorder    Echo in 2009  showed mean aortic valve gradient of 13 mmHg, suggesting very mild stenosis. Echo (12/10) suggested aortic sclerosis only  . Carotid artery disease (HCC)    mild, carotid dopplers 12/2009  . CHF (congestive heart failure) ([place])   . COPD (chronic obstructive pulmonary disease) ([place])   . Coronary artery disease    s/p anterior MI in 1996 followed by CABG. Lexiscan myoview (4/11): EF 67%, normal perfusion with no evidence for ischemia or infarction.   . Diabetes mellitus    type 2  . Diastolic heart failure    most recent echo (12/10) showed EF 55-60% with mild LVH, grade I diastolic dysfunction, mild LAE.  [name] ESRD (end stage renal disease) on dialysis ([place])   . Hyperlipidemia   . Hypertension    resistant hyptertension times many years. the patient does have renal artery stenosis. she has tried calcium channel blockers in the past and states that she would not take them now because she had some problems with her gums which her dentist identified as calcium-channel blocker side effects.  . Hypothyroidism   . Mild asthma    PFT 02/05/10 FEV1 1.38, FEV1% 73, TLC 3.78 (86%), DLCO 48%, +BD  . Myocardial infarction ([place])   . Obesity   . Obesity hypoventilation syndrome ([place])       . Obstructive sleep apnea    PSG 01/05/2006 AHI 24.8, CPAP 9cm  H2O  . Pulmonary nodule, right   . Renal artery stenosis [facility])    The patient has an occluded right renal artery and an atrophic right kidney. There is 20% left renal artery stenosis. This was seen by catheterization in 2007  . Stroke [facility])     Past Surgical History:  Procedure Laterality Date  . ABDOMINAL HYSTERECTOMY  1985  . AV FISTULA PLACEMENT    . CATARACT EXTRACTION  2010  . CORONARY ARTERY BYPASS GRAFT  1996  . DIALYSIS FISTULA CREATION    . [place]   right  . PERIPHERAL VASCULAR CATHETERIZATION Left 01/06/2015   Procedure: A/V Shuntogram/Fistulagram;  Surgeon: Katha Cabal, MD;  Location: [facility];  Service:  Cardiovascular;  Laterality: Left;  . PERIPHERAL VASCULAR CATHETERIZATION N/A 02/17/2015   Procedure: Dialysis/Perma Catheter Removal;  Surgeon: Katha Cabal, MD;  Location: [facility];  Service: Cardiovascular;  Laterality: N/A;  . PERIPHERAL VASCULAR CATHETERIZATION N/A 01/28/2016   Procedure: A/V Shuntogram/Fistulagram;  Surgeon: Algernon Huxley, MD;  Location: [facility];  Service: Cardiovascular;  Laterality: N/A;  . PERIPHERAL VASCULAR CATHETERIZATION N/A 01/28/2016   Procedure: A/V Shunt Intervention;  Surgeon: Algernon Huxley, MD;  Location: [facility];  Service: Cardiovascular;  Laterality: N/A;  . TUBAL LIGATION  1973    Social History Social History   Tobacco Use  . Smoking status: Never Smoker  . Smokeless tobacco: Never Used  Substance Use Topics  . Alcohol use: No  . Drug use: No    Family History Family History  Problem Relation Age of Onset  . Kidney failure Mother   . Diabetes Mother   . Aortic aneurysm Father   . Coronary artery disease Father   . Heart attack Father   . Diabetes Sister   . Diabetes Brother   . Kidney failure Brother     No family history of bleeding or clotting disorders, autoimmune disease or porphyria  Allergies  Allergen Reactions  . Ace Inhibitors Other (See Comments)    Reaction:  Unknown   . Amlodipine Other (See Comments)    Reaction:  Unknown   . Clonidine Hydrochloride Other (See Comments)    Reaction:  Unknown   . Codeine Hives  . Metformin Nausea And Vomiting     REVIEW OF SYSTEMS (Negative unless checked)  Constitutional: [] Weight loss  [] Fever  [] Chills Cardiac: [] Chest pain   [] Chest pressure   [] Palpitations   [] Shortness of breath when laying flat   [] Shortness of breath at rest   [x] Shortness of breath with exertion. Vascular:  [] Pain in legs with walking   [] Pain in legs at rest   [] Pain in legs when laying flat   [] Claudication   [] Pain in feet when walking  [] Pain in feet at rest  [] Pain  in feet when laying flat   [] History of DVT   [] Phlebitis   [x] Swelling in legs   [] Varicose veins   [] Non-healing ulcers Pulmonary:   [] Uses home oxygen   [] Productive cough   [] Hemoptysis   [] Wheeze  [x] COPD   [x] Asthma Neurologic:  [] Dizziness  [] Blackouts   [] Seizures   [x] History of stroke   [] History of TIA  [] Aphasia   [] Temporary blindness   [] Dysphagia   [] Weakness or numbness in arms   [] Weakness or numbness in legs Musculoskeletal:  [x] Arthritis   [] Joint swelling   [x] Joint pain   [] Low back pain Hematologic:  [] Easy bruising  [] Easy bleeding   []   Hypercoagulable state   [] Anemic  [] Hepatitis Gastrointestinal:  [] Blood in stool   [] Vomiting blood  [x] Gastroesophageal reflux/heartburn   [] Difficulty swallowing. Genitourinary:  [x] Chronic kidney disease   [] Difficult urination  [] Frequent urination  [] Burning with urination   [] Blood in urine Skin:  [] Rashes   [] Ulcers   [] Wounds Psychological:  [] History of anxiety   []  History of major depression.  Physical Examination  Vitals:   01/25/18 0950  BP: (!) 156/64  Pulse: 64  Resp: 20  Temp: 97.8 F (36.6 C)  TempSrc: Oral  SpO2: 96%  Weight: 220 lb (99.8 kg)  Height: 5\' 3"  (1.6 m)   Body mass index is 38.97 kg/m. Gen: WD/WN, NAD. obese Head: [place]/AT, No temporalis wasting.  Ear/Nose/Throat: Hearing grossly intact, nares w/o erythema or drainage, oropharynx w/o Erythema/Exudate,  Eyes: Conjunctiva clear, sclera non-icteric Neck: Trachea midline.  No JVD.  Pulmonary:  Good air movement, respirations not labored, no use of accessory muscles.  Cardiac: RRR, normal S1, S2. Vascular: thrill is present in left arm AVF Vessel Right Left  Radial Palpable Palpable               Musculoskeletal: M/S 5/5 throughout.  Extremities without ischemic changes.  No deformity or atrophy.  Neurologic: Sensation grossly intact in extremities.  Symmetrical.  Speech is fluent. Motor exam as listed above. Psychiatric: Judgment intact, Mood &  affect appropriate for pt's clinical situation. Dermatologic: No rashes or ulcers noted.  No cellulitis or open wounds.    CBC Lab Results  Component Value Date   WBC 10.7 10/11/2017   HGB 10.5 (L) 10/11/2017   HCT 30.7 (L) 10/11/2017   MCV 103.7 (H) 10/11/2017   PLT 128 (L) 10/11/2017    BMET    Component Value Date/Time   NA 131 (L) 10/11/2017 2315   NA 137 09/15/2014 1032   K 3.6 10/11/2017 2315   K 3.7 09/15/2014 1032   CL 96 (L) 10/11/2017 2315   CL 97 (L) 09/15/2014 1032   CO2 24 10/11/2017 2315   CO2 32 09/15/2014 1032   GLUCOSE 130 (H) 10/11/2017 2315   GLUCOSE 112 (H) 09/15/2014 1032   BUN 26 (H) 10/11/2017 2315   BUN 38 (H) 09/15/2014 1032   CREATININE 2.45 (H) 10/11/2017 2315   CREATININE 4.03 (H) 09/15/2014 1032   CREATININE 1.54 (H) 05/10/2011 0940   CALCIUM 8.4 (L) 10/11/2017 2315   CALCIUM 9.0 09/15/2014 1032   GFRNONAA 18 (L) 10/11/2017 2315   GFRNONAA 11 (L) 09/15/2014 1032   GFRAA 20 (L) 10/11/2017 2315   GFRAA 14 (L) 09/15/2014 1032   CrCl cannot be calculated (Patient's most recent lab result is older than the maximum 21 days allowed.).  COAG Lab Results  Component Value Date   INR 1.2 09/12/2014    Radiology No results found.  Assessment/Plan 1.  Complication dialysis device with dysfunction of AV access:  Patient's left arm dialysis access is malfunctioning. The patient will undergo angiography and correction of any problems using interventional techniques with the hope of restoring function to the access.  The risks and benefits were described to the patient.  All questions were answered.  The patient agrees to proceed with angiography and intervention. Potassium will be drawn to ensure that it is an appropriate level prior to performing intervention. 2.  End-stage renal disease requiring hemodialysis:  Patient will continue dialysis therapy without further interruption if a successful intervention is not achieved then a tunneled catheter  will be placed. Dialysis has  already been arranged. 3.  Hypertension:  Patient will continue medical management; nephrology is following no changes in oral medications. 4.  Diabetes mellitus:  Glucose will be monitored and oral medications been held this morning once the patient has undergone the patient's procedure po intake will be reinitiated and again Accu-Cheks will be used to assess the blood glucose level and treat as needed. The patient will be restarted on the patient's usual hypoglycemic regime     Leotis Pain, MD  01/25/2018 10:07 AM

## 2018-05-01 ENCOUNTER — Ambulatory Visit (INDEPENDENT_AMBULATORY_CARE_PROVIDER_SITE_OTHER): Payer: Medicare Other | Admitting: Pulmonary Disease

## 2018-05-01 ENCOUNTER — Encounter: Payer: Self-pay | Admitting: Pulmonary Disease

## 2018-05-01 VITALS — BP 126/78 | HR 79 | Ht 63.0 in | Wt 213.0 lb

## 2018-05-01 DIAGNOSIS — G4733 Obstructive sleep apnea (adult) (pediatric): Secondary | ICD-10-CM

## 2018-05-01 NOTE — Progress Notes (Signed)
[facility]  Chief Complaint  Patient presents with  . Follow-up    1y rov- wearing cpap avg 8hr- feels pressure and mask is okay.  TOI:[id number]    Constitutional: BP 126/78 (BP Location: Left Arm, Cuff Size: Normal)   Pulse 79   Ht 5\' 3"  (1.6 m)   Wt 213 lb (96.6 kg)   LMP 02/01/1986 (Approximate)   SpO2 94%   BMI 37.73 kg/m   History of Present Illness: Dawn Bradford is a 82 y.o. female with OSA/OHS, asthma, diastolic CHF, and deconditioning.  She is here with her caregiver.  She was in hospital in February with shortness of breath.  Has been doing better recently.  Not having cough, wheeze, sputum, chest congestion, fever, chest pain, abdominal pain, or leg swelling.  Gets HD in [place].  Uses CPAP nightly.  No issue with mask fit.  Not using oxygen at night anymore.  Comprehensive Respiratory Exam:  Appearance - well kempt  ENMT - nasal mucosa moist, turbinates clear, midline nasal septum, no dental lesions, no gingival bleeding, no oral exudates, no tonsillar hypertrophy Neck - no masses, trachea midline, no thyromegaly, no elevation in JVP Respiratory - normal appearance of chest wall, normal respiratory effort w/o accessory muscle use, no dullness on percussion, no wheezing or rales CV - s1s2 regular rate and rhythm, no murmurs, no peripheral edema, radial pulses symmetric GI - soft, non tender, no masses, no hepatosplenomegaly Lymph - no adenopathy noted in neck and axillary areas MSK - normal muscle strength and tone, uses a walker Ext - no cyanosis, clubbing, or joint inflammation noted, AV graft Lt arm Skin - no rashes, lesions, or ulcers Neuro - oriented to person, place, and time Psych - normal mood and affect   Assessment/Plan:  Obstructive sleep apnea. - she is compliant with CPAP and reports benefit - continue CPAP 8 cm H2O  Mild, intermittent asthma. - prn albuterol   Patient Instructions  Follow up in 1  year    Chesley Mires, MD [place] 05/01/2018, 11:56 AM  Flow Sheet  Sleep tests: PSG 01/05/06>>AHI 24.8  ONO with CPAP and RA 04/05/17 >> test time 10 hrs 54 min.  Basal SpO2 93%, low SpO2 85%.  Spent 8 min with SpO2 < 88%. CPAP 04/01/18 to 04/30/18 >> used on 30 of 30 nights with average 12 hrs 34 min.  Average AHI 3.5 with CPAP 8 cm H2O  Pulmonary tests V/Q scan 01/26/10>>very low probability for PE PFT 02/05/10>>FEV1 1.38(77%), FEV1% 73, TLC 3.78(86%), DLCO 48%, +BD CT chest 05/31/11>>5 mm RUL nodule stable for 2 yrs  Cardiac tests Echo 02/01/15 >> EF 55 to 60%, PAS 43 mmHg  Past Medical History: She  has a past medical history of ACE-inhibitor cough, Aortic valve disorder, Carotid artery disease ([place]), CHF (congestive heart failure) ([place]), COPD (chronic obstructive pulmonary disease) ([place]), Coronary artery disease, Diabetes mellitus, Diastolic heart failure, ESRD (end stage renal disease) on dialysis ([place]), Hyperlipidemia, Hypertension, Hypothyroidism, Mild asthma, Myocardial infarction ([place]), Obesity, Obesity hypoventilation syndrome ([place]), Obstructive sleep apnea, Pulmonary nodule, right, Renal artery stenosis ([place]), and Stroke ([place]).  Past Surgical History: She  has a past surgical history that includes Tubal ligation (1973); Inner ear surgery (1979); Abdominal hysterectomy (1985); Coronary artery bypass graft (1996); Cataract extraction (2010); AV fistula placement; Dialysis fistula creation; Cardiac catheterization (Left, 01/06/2015); Cardiac catheterization (N/A, 02/17/2015); Cardiac catheterization (N/A, 01/28/2016); Cardiac catheterization (N/A, 01/28/2016); and A/V Fistulagram (N/A, 01/25/2018).  Family History:  Her family history includes Aortic aneurysm in her father; Coronary artery disease in her father; Diabetes in her brother, mother, and sister; Heart attack in her father; Kidney failure in her brother and mother.  Social History: She  reports that she  has never smoked. She has never used smokeless tobacco. She reports that she does not drink alcohol or use drugs.  Medications: Allergies as of 05/01/2018      Reactions   Ace Inhibitors Other (See Comments)   Reaction:  Unknown    Amlodipine Other (See Comments)   Reaction:  Unknown    Clonidine Hydrochloride Other (See Comments)   Reaction:  Unknown    Codeine Hives   Metformin Nausea And Vomiting      Medication List        Accurate as of 05/01/18 11:56 AM. Always use your most recent med list.          acetaminophen 500 MG tablet Commonly known as:  TYLENOL Take 1,000 mg by mouth every 6 (six) hours as needed for mild pain.   albuterol 108 (90 Base) MCG/ACT inhaler Commonly known as:  PROVENTIL HFA;VENTOLIN HFA Inhale 2 puffs into the lungs every 6 (six) hours as needed for wheezing or shortness of breath.   aspirin EC 81 MG tablet Take 81 mg by mouth daily.   b complex-vitamin c-folic acid 0.8 MG Tabs tablet Take 1 tablet by mouth daily.   carvedilol 25 MG tablet Commonly known as:  COREG Take 25 mg by mouth 2 (two) times daily.   clopidogrel 75 MG tablet Commonly known as:  PLAVIX Take 1 tablet (75 mg total) by mouth daily.   ferrous fumarate-iron polysaccharide complex 162-115.2 MG Caps capsule Commonly known as:  TANDEM Take 1 capsule by mouth daily.   FIFTY50 PEN NEEDLES 32G X 6 MM Misc Generic drug:  Insulin Pen Needle Use as directed.   FLUoxetine 20 MG tablet Commonly known as:  PROZAC Take 20 mg by mouth daily.   furosemide 40 MG tablet Commonly known as:  LASIX Take 20-80 mg by mouth. Sunday, Tuesday and Saturday pt takes 80 mg in the morning. Thursdays 20 mg only. Tuesdays pt may take lower dose.   gabapentin 100 MG capsule Commonly known as:  NEURONTIN 100 mg.   hydrALAZINE 50 MG tablet Commonly known as:  APRESOLINE Take 50 mg by mouth 2 (two) times daily.   IRON PO Take by mouth.   isosorbide mononitrate 30 MG 24 hr  tablet Commonly known as:  IMDUR Take 30 mg by mouth at bedtime.   lactulose 10 GM/15ML solution Commonly known as:  [place] rectally.   levothyroxine 75 MCG tablet Commonly known as:  SYNTHROID, LEVOTHROID Take 75 mcg by mouth daily.   lidocaine-prilocaine cream Commonly known as:  EMLA Apply 1 application topically as needed (for pain).   nystatin powder Generic drug:  nystatin Apply 1 g topically as needed (for irritation). Reported on 01/28/2016   rosuvastatin 40 MG tablet Commonly known as:  CRESTOR Take 40 mg by mouth every evening.   telmisartan 80 MG tablet Commonly known as:  MICARDIS Take 80 mg by mouth daily. At night   traZODone 50 MG tablet Commonly known as:  DESYREL Take 50 mg by mouth at bedtime.   triamcinolone cream 0.5 % Commonly known as:  KENALOG Apply 1 application topically as needed (for irritation).   Vitamin D3 5000 units Tabs Take 5,000 Units by mouth daily.

## 2018-05-01 NOTE — Patient Instructions (Signed)
Follow up in 1 year.

## 2018-05-24 ENCOUNTER — Other Ambulatory Visit: Payer: Self-pay | Admitting: Nephrology

## 2018-05-24 DIAGNOSIS — R300 Dysuria: Secondary | ICD-10-CM

## 2018-05-24 DIAGNOSIS — N186 End stage renal disease: Secondary | ICD-10-CM

## 2018-05-29 ENCOUNTER — Ambulatory Visit
Admission: RE | Admit: 2018-05-29 | Discharge: 2018-05-29 | Disposition: A | Discharge status: Home / Self Care | Payer: Medicare Other | Source: Ambulatory Visit | Attending: Nephrology | Admitting: Nephrology

## 2018-05-29 DIAGNOSIS — N186 End stage renal disease: Secondary | ICD-10-CM

## 2018-05-29 DIAGNOSIS — N261 Atrophy of kidney (terminal): Secondary | ICD-10-CM | POA: Insufficient documentation

## 2018-05-29 DIAGNOSIS — Z992 Dependence on renal dialysis: Secondary | ICD-10-CM | POA: Diagnosis not present

## 2018-05-29 DIAGNOSIS — R300 Dysuria: Secondary | ICD-10-CM

## 2018-06-19 ENCOUNTER — Ambulatory Visit (INDEPENDENT_AMBULATORY_CARE_PROVIDER_SITE_OTHER): Payer: Medicare Other | Admitting: Vascular Surgery

## 2018-06-19 ENCOUNTER — Encounter

## 2018-06-19 ENCOUNTER — Encounter (INDEPENDENT_AMBULATORY_CARE_PROVIDER_SITE_OTHER): Payer: Self-pay | Admitting: Vascular Surgery

## 2018-06-19 ENCOUNTER — Ambulatory Visit (INDEPENDENT_AMBULATORY_CARE_PROVIDER_SITE_OTHER): Payer: Medicare Other

## 2018-06-19 ENCOUNTER — Other Ambulatory Visit (INDEPENDENT_AMBULATORY_CARE_PROVIDER_SITE_OTHER): Payer: Self-pay | Admitting: Vascular Surgery

## 2018-06-19 VITALS — BP 136/56 | HR 71 | Resp 16 | Ht 63.0 in | Wt 221.2 lb

## 2018-06-19 DIAGNOSIS — N186 End stage renal disease: Secondary | ICD-10-CM

## 2018-06-19 DIAGNOSIS — T829XXD Unspecified complication of cardiac and vascular prosthetic device, implant and graft, subsequent encounter: Secondary | ICD-10-CM | POA: Diagnosis not present

## 2018-06-19 DIAGNOSIS — I120 Hypertensive chronic kidney disease with stage 5 chronic kidney disease or end stage renal disease: Secondary | ICD-10-CM | POA: Diagnosis not present

## 2018-06-19 DIAGNOSIS — E785 Hyperlipidemia, unspecified: Secondary | ICD-10-CM | POA: Diagnosis not present

## 2018-06-19 DIAGNOSIS — Z992 Dependence on renal dialysis: Secondary | ICD-10-CM | POA: Diagnosis not present

## 2018-06-19 DIAGNOSIS — I10 Essential (primary) hypertension: Secondary | ICD-10-CM

## 2018-06-19 NOTE — Progress Notes (Signed)
MRN : [id number]  Dawn Bradford is a 82 y.o. (April 09, 1936) female who presents with chief complaint of  Chief Complaint  Patient presents with  . Follow-up    26month [facility] HDA  .  History of Present Illness: Patient returns today in follow up of her dialysis access.  About a month ago, she underwent fistulogram with intervention for her left radiocephalic AV fistula.  Since that time, she states that has been doing well with dialysis without any major problems.  Duplex today shows distal and the stent within the fistula to be widely patent.  No significant stenosis was identified.  Current Outpatient Medications  Medication Sig Dispense Refill  . acetaminophen (TYLENOL) 500 MG tablet Take 1,000 mg by mouth every 6 (six) hours as needed for mild pain.    [name] albuterol (PROVENTIL HFA;VENTOLIN HFA) 108 (90 BASE) MCG/ACT inhaler Inhale 2 puffs into the lungs every 6 (six) hours as needed for wheezing or shortness of breath.    [name] aspirin EC 81 MG tablet Take 81 mg by mouth daily.    [name] b complex-vitamin c-folic acid (NEPHRO-VITE) 0.8 MG TABS tablet Take 1 tablet by mouth daily.    . carvedilol (COREG) 25 MG tablet Take 25 mg by mouth 2 (two) times daily.     . Cholecalciferol (VITAMIN D3) 5000 UNITS TABS Take 5,000 Units by mouth daily.    . clopidogrel (PLAVIX) 75 MG tablet Take 1 tablet (75 mg total) by mouth daily. 30 tablet 2  . ferrous fumarate-iron polysaccharide complex (TANDEM) 162-115.2 MG CAPS capsule Take 1 capsule by mouth daily.    [name] FLUoxetine (PROZAC) 20 MG tablet Take 20 mg by mouth daily.   3  . furosemide (LASIX) 40 MG tablet Take 20-80 mg by mouth. Sunday, Tuesday and Saturday pt takes 80 mg in the morning. Thursdays 20 mg only. Tuesdays pt may take lower dose.    . gabapentin (NEURONTIN) 100 MG capsule 100 mg.   0  . hydrALAZINE (APRESOLINE) 50 MG tablet Take 50 mg by mouth 2 (two) times daily.   1  . IRON PO Take by mouth.    . isosorbide mononitrate (IMDUR) 30 MG 24 hr tablet  Take 30 mg by mouth at bedtime.   0  . lactulose (CHRONULAC) 10 GM/15ML solution Place rectally.    [name] levothyroxine (SYNTHROID, LEVOTHROID) 75 MCG tablet Take 75 mcg by mouth daily.     [name] lidocaine-prilocaine (EMLA) cream Apply 1 application topically as needed (for pain).    . nystatin (MYCOSTATIN/NYSTOP) 100000 UNIT/GM POWD Apply 1 g topically as needed (for irritation). Reported on 01/28/2016    . rosuvastatin (CRESTOR) 40 MG tablet Take 40 mg by mouth every evening.     [name] telmisartan (MICARDIS) 80 MG tablet Take 80 mg by mouth daily. At night    . traZODone (DESYREL) 50 MG tablet Take 50 mg by mouth at bedtime.    . triamcinolone (KENALOG) 0.5 % cream Apply 1 application topically as needed (for irritation).      No current facility-administered medications for this visit.     Past Medical History:  Diagnosis Date  . ACE-inhibitor cough   . Aortic valve disorder    Echo in 2009 showed mean aortic valve gradient of 13 mmHg, suggesting very mild stenosis. Echo (12/10) suggested aortic sclerosis only  . Carotid artery disease (HCC)    mild, carotid dopplers 12/2009  . CHF (congestive heart failure) ([place])   . COPD (chronic obstructive  pulmonary disease) ([place])   . Coronary artery disease    s/p anterior MI in 1996 followed by CABG. Lexiscan myoview (4/11): EF 67%, normal perfusion with no evidence for ischemia or infarction.   . Diabetes mellitus    type 2  . Diastolic heart failure    most recent echo (12/10) showed EF 55-60% with mild LVH, grade I diastolic dysfunction, mild LAE.  [name] ESRD (end stage renal disease) on dialysis ([place])   . Hyperlipidemia   . Hypertension    resistant hyptertension times many years. the patient does have renal artery stenosis. she has tried calcium channel blockers in the past and states that she would not take them now because she had some problems with her gums which her dentist identified as calcium-channel blocker side effects.  . Hypothyroidism   .  Mild asthma    PFT 02/05/10 FEV1 1.38, FEV1% 73, TLC 3.78 (86%), DLCO 48%, +BD  . Myocardial infarction ([place])   . Obesity   . Obesity hypoventilation syndrome ([place])       . Obstructive sleep apnea    PSG 01/05/2006 AHI 24.8, CPAP 9cm H2O  . Pulmonary nodule, right   . Renal artery stenosis [facility])    The patient has an occluded right renal artery and an atrophic right kidney. There is 20% left renal artery stenosis. This was seen by catheterization in 2007  . Stroke [facility])     Past Surgical History:  Procedure Laterality Date  . A/V FISTULAGRAM N/A 01/25/2018   Procedure: A/V FISTULAGRAM;  Surgeon: Algernon Huxley, MD;  Location: [place] [facility];  Service: Cardiovascular;  Laterality: N/A;  . ABDOMINAL HYSTERECTOMY  1985  . AV FISTULA PLACEMENT    . CATARACT EXTRACTION  2010  . CORONARY ARTERY BYPASS GRAFT  1996  . DIALYSIS FISTULA CREATION    . [place]   right  . PERIPHERAL VASCULAR CATHETERIZATION Left 01/06/2015   Procedure: A/V Shuntogram/Fistulagram;  Surgeon: Katha Cabal, MD;  Location: [place] [facility];  Service: Cardiovascular;  Laterality: Left;  . PERIPHERAL VASCULAR CATHETERIZATION N/A 02/17/2015   Procedure: Dialysis/Perma Catheter Removal;  Surgeon: Katha Cabal, MD;  Location: [place] [facility];  Service: Cardiovascular;  Laterality: N/A;  . PERIPHERAL VASCULAR CATHETERIZATION N/A 01/28/2016   Procedure: A/V Shuntogram/Fistulagram;  Surgeon: Algernon Huxley, MD;  Location: [place] [facility];  Service: Cardiovascular;  Laterality: N/A;  . PERIPHERAL VASCULAR CATHETERIZATION N/A 01/28/2016   Procedure: A/V Shunt Intervention;  Surgeon: Algernon Huxley, MD;  Location: [place] [facility];  Service: Cardiovascular;  Laterality: N/A;  . TUBAL LIGATION  1973    Social History Social History   Tobacco Use  . Smoking status: Never Smoker  . Smokeless tobacco: Never Used  Substance Use Topics  . Alcohol use: No  . Drug use: No     Family  History Family History  Problem Relation Age of Onset  . Kidney failure Mother   . Diabetes Mother   . Aortic aneurysm Father   . Coronary artery disease Father   . Heart attack Father   . Diabetes Sister   . Diabetes Brother   . Kidney failure Brother      Allergies  Allergen Reactions  . Ace Inhibitors Other (See Comments)    Reaction:  Unknown   . Amlodipine Other (See Comments)    Reaction:  Unknown   . Clonidine Hydrochloride Other (See Comments)    Reaction:  Unknown   .  Codeine Hives  . Latex Hives  . Metformin Nausea And Vomiting  . Tape Rash     REVIEW OF SYSTEMS (Negative unless checked)  Constitutional: [] Weight loss  [] Fever  [] Chills Cardiac: [] Chest pain   [] Chest pressure   [] Palpitations   [] Shortness of breath when laying flat   [x] Shortness of breath at rest   [x] Shortness of breath with exertion. Vascular:  [] Pain in legs with walking   [] Pain in legs at rest   [] Pain in legs when laying flat   [] Claudication   [] Pain in feet when walking  [] Pain in feet at rest  [] Pain in feet when laying flat   [] History of DVT   [] Phlebitis   [] Swelling in legs   [] Varicose veins   [] Non-healing ulcers Pulmonary:   [] Uses home oxygen   [] Productive cough   [] Hemoptysis   [] Wheeze  [x] COPD   [x] Asthma Neurologic:  [] Dizziness  [] Blackouts   [] Seizures   [x] History of stroke   [] History of TIA  [] Aphasia   [] Temporary blindness   [] Dysphagia   [] Weakness or numbness in arms   [] Weakness or numbness in legs Musculoskeletal:  [x] Arthritis   [] Joint swelling   [] Joint pain   [] Low back pain Hematologic:  [] Easy bruising  [] Easy bleeding   [] Hypercoagulable state   [x] Anemic   Gastrointestinal:  [] Blood in stool   [] Vomiting blood  [] Gastroesophageal reflux/heartburn   [] Abdominal pain Genitourinary:  [x] Chronic kidney disease   [] Difficult urination  [] Frequent urination  [] Burning with urination   [] Hematuria Skin:  [] Rashes   [] Ulcers   [] Wounds Psychological:  [] History of  anxiety   []  History of major depression.  Physical Examination  BP (!) 136/56 (BP Location: Right Arm)   Pulse 71   Resp 16   Ht 5\' 3"  (1.6 m)   Wt 221 lb 3.2 oz (100.3 kg)   LMP 02/01/1986 (Approximate)   BMI 39.18 kg/m  Gen:  WD/WN, NAD Head: [place]/AT, No temporalis wasting. Ear/Nose/Throat: Hearing grossly intact, nares w/o erythema or drainage Eyes: Conjunctiva clear. Sclera non-icteric Neck: Supple.  Trachea midline Pulmonary:  Good air movement, no use of accessory muscles.  Cardiac: RRR, no JVD Vascular: good thrill in left radiocephalic AVF Vessel Right Left  Radial Palpable Palpable                                    Musculoskeletal: M/S 5/5 throughout.  No deformity or atrophy. Uses a walker Neurologic: Sensation grossly intact in extremities.  Symmetrical.  Speech is fluent.  Psychiatric: Judgment intact, Mood & affect appropriate for pt's clinical situation. Dermatologic: No rashes or ulcers noted.  No cellulitis or open wounds.       Labs No results found for this or any previous visit (from the past 2160 hour(s)).  Radiology US Renal  Result Date: 05/29/2018 CLINICAL DATA:  82 year old female with difficulty voiding. Dysuria. End-stage renal disease. On dialysis. Recent UTI. Initial encounter. EXAM: RENAL / URINARY TRACT ULTRASOUND COMPLETE COMPARISON:  None. FINDINGS: Right Kidney: Length: 6.2 cm. Atrophic. Renal parenchymal thinning. Echogenic renal parenchyma. No hydronephrosis. Subcentimeter cyst. Left Kidney: Length: 12.1 cm. Echogenicity within normal limits. Mild renal parenchymal thinning. No hydronephrosis or mass. Bladder: Appears normal for degree of bladder distention. Ureteral jets not visualized. IMPRESSION: 1. Atrophic right kidney. 2. Mild left renal parenchymal thinning. 3. No hydronephrosis. 4. Ureteral jets not visualized. Electronically Signed   By: Alcide Evener.D.  On: 05/29/2018 15:13    Assessment/Plan  Hyperlipidemia lipid  control important in reducing the progression of atherosclerotic disease. Continue statin therapy   Essential hypertension Likely an underlying cause of renal failure and blood pressure control important in reducing the progression of atherosclerotic disease. On appropriate oral medications.   ESRD on dialysis [facility]) Duplex today shows distal and the stent within the fistula to be widely patent.  No significant stenosis was identified. This is doing much better after her intervention.  This has now required multiple interventions so we will plan on checking her back in 6 months with duplex or sooner if problems develop in the interim.    Leotis Pain, MD  06/19/2018 4:27 PM    This note was created with Dragon medical transcription system.  Any errors from dictation are purely unintentional

## 2018-06-19 NOTE — Assessment & Plan Note (Signed)
Likely an underlying cause of renal failure and blood pressure control important in reducing the progression of atherosclerotic disease. On appropriate oral medications.  

## 2018-06-19 NOTE — Assessment & Plan Note (Signed)
lipid control important in reducing the progression of atherosclerotic disease. Continue statin therapy  

## 2018-06-19 NOTE — Assessment & Plan Note (Signed)
Duplex today shows distal and the stent within the fistula to be widely patent.  No significant stenosis was identified. This is doing much better after her intervention.  This has now required multiple interventions so we will plan on checking her back in 6 months with duplex or sooner if problems develop in the interim.

## 2018-07-04 ENCOUNTER — Other Ambulatory Visit: Payer: Self-pay | Admitting: Family Medicine

## 2018-07-04 DIAGNOSIS — Z1231 Encounter for screening mammogram for malignant neoplasm of breast: Secondary | ICD-10-CM

## 2018-08-10 ENCOUNTER — Inpatient Hospital Stay
Admission: EM | Admit: 2018-08-10 | Discharge: 2018-08-21 | DRG: 480 | Disposition: A | Discharge status: Skilled Nursing Facility | Payer: Medicare Other | Attending: Internal Medicine | Admitting: Internal Medicine

## 2018-08-10 DIAGNOSIS — I359 Nonrheumatic aortic valve disorder, unspecified: Secondary | ICD-10-CM | POA: Diagnosis present

## 2018-08-10 DIAGNOSIS — Z419 Encounter for procedure for purposes other than remedying health state, unspecified: Secondary | ICD-10-CM

## 2018-08-10 DIAGNOSIS — I509 Heart failure, unspecified: Secondary | ICD-10-CM

## 2018-08-10 DIAGNOSIS — Z79890 Hormone replacement therapy (postmenopausal): Secondary | ICD-10-CM

## 2018-08-10 DIAGNOSIS — I701 Atherosclerosis of renal artery: Secondary | ICD-10-CM | POA: Diagnosis present

## 2018-08-10 DIAGNOSIS — Z91048 Other nonmedicinal substance allergy status: Secondary | ICD-10-CM

## 2018-08-10 DIAGNOSIS — Z992 Dependence on renal dialysis: Secondary | ICD-10-CM

## 2018-08-10 DIAGNOSIS — D696 Thrombocytopenia, unspecified: Secondary | ICD-10-CM | POA: Diagnosis present

## 2018-08-10 DIAGNOSIS — Z888 Allergy status to other drugs, medicaments and biological substances status: Secondary | ICD-10-CM

## 2018-08-10 DIAGNOSIS — Z833 Family history of diabetes mellitus: Secondary | ICD-10-CM

## 2018-08-10 DIAGNOSIS — Z8249 Family history of ischemic heart disease and other diseases of the circulatory system: Secondary | ICD-10-CM

## 2018-08-10 DIAGNOSIS — I252 Old myocardial infarction: Secondary | ICD-10-CM

## 2018-08-10 DIAGNOSIS — E778 Other disorders of glycoprotein metabolism: Secondary | ICD-10-CM | POA: Diagnosis present

## 2018-08-10 DIAGNOSIS — Z6841 Body Mass Index (BMI) 40.0 and over, adult: Secondary | ICD-10-CM

## 2018-08-10 DIAGNOSIS — E785 Hyperlipidemia, unspecified: Secondary | ICD-10-CM | POA: Diagnosis present

## 2018-08-10 DIAGNOSIS — W1830XA Fall on same level, unspecified, initial encounter: Secondary | ICD-10-CM | POA: Diagnosis present

## 2018-08-10 DIAGNOSIS — Z951 Presence of aortocoronary bypass graft: Secondary | ICD-10-CM

## 2018-08-10 DIAGNOSIS — Z841 Family history of disorders of kidney and ureter: Secondary | ICD-10-CM

## 2018-08-10 DIAGNOSIS — E1122 Type 2 diabetes mellitus with diabetic chronic kidney disease: Secondary | ICD-10-CM | POA: Diagnosis present

## 2018-08-10 DIAGNOSIS — D631 Anemia in chronic kidney disease: Secondary | ICD-10-CM | POA: Diagnosis present

## 2018-08-10 DIAGNOSIS — Y92009 Unspecified place in unspecified non-institutional (private) residence as the place of occurrence of the external cause: Secondary | ICD-10-CM

## 2018-08-10 DIAGNOSIS — J449 Chronic obstructive pulmonary disease, unspecified: Secondary | ICD-10-CM | POA: Diagnosis present

## 2018-08-10 DIAGNOSIS — F419 Anxiety disorder, unspecified: Secondary | ICD-10-CM | POA: Diagnosis present

## 2018-08-10 DIAGNOSIS — Z539 Procedure and treatment not carried out, unspecified reason: Secondary | ICD-10-CM | POA: Diagnosis present

## 2018-08-10 DIAGNOSIS — S72141A Displaced intertrochanteric fracture of right femur, initial encounter for closed fracture: Secondary | ICD-10-CM | POA: Diagnosis present

## 2018-08-10 DIAGNOSIS — I69320 Aphasia following cerebral infarction: Secondary | ICD-10-CM

## 2018-08-10 DIAGNOSIS — I5032 Chronic diastolic (congestive) heart failure: Secondary | ICD-10-CM | POA: Diagnosis present

## 2018-08-10 DIAGNOSIS — G4733 Obstructive sleep apnea (adult) (pediatric): Secondary | ICD-10-CM | POA: Diagnosis present

## 2018-08-10 DIAGNOSIS — Z7902 Long term (current) use of antithrombotics/antiplatelets: Secondary | ICD-10-CM

## 2018-08-10 DIAGNOSIS — E039 Hypothyroidism, unspecified: Secondary | ICD-10-CM | POA: Diagnosis present

## 2018-08-10 DIAGNOSIS — I951 Orthostatic hypotension: Secondary | ICD-10-CM | POA: Diagnosis present

## 2018-08-10 DIAGNOSIS — I132 Hypertensive heart and chronic kidney disease with heart failure and with stage 5 chronic kidney disease, or end stage renal disease: Secondary | ICD-10-CM | POA: Diagnosis present

## 2018-08-10 DIAGNOSIS — S72001A Fracture of unspecified part of neck of right femur, initial encounter for closed fracture: Secondary | ICD-10-CM

## 2018-08-10 DIAGNOSIS — E1165 Type 2 diabetes mellitus with hyperglycemia: Secondary | ICD-10-CM | POA: Diagnosis present

## 2018-08-10 DIAGNOSIS — Z7982 Long term (current) use of aspirin: Secondary | ICD-10-CM

## 2018-08-10 DIAGNOSIS — N186 End stage renal disease: Secondary | ICD-10-CM | POA: Diagnosis not present

## 2018-08-10 DIAGNOSIS — E871 Hypo-osmolality and hyponatremia: Secondary | ICD-10-CM | POA: Diagnosis present

## 2018-08-10 DIAGNOSIS — R0902 Hypoxemia: Secondary | ICD-10-CM | POA: Diagnosis present

## 2018-08-10 DIAGNOSIS — Z885 Allergy status to narcotic agent status: Secondary | ICD-10-CM

## 2018-08-10 DIAGNOSIS — Z90710 Acquired absence of both cervix and uterus: Secondary | ICD-10-CM

## 2018-08-10 DIAGNOSIS — N2581 Secondary hyperparathyroidism of renal origin: Secondary | ICD-10-CM | POA: Diagnosis present

## 2018-08-10 DIAGNOSIS — Z91040 Latex allergy status: Secondary | ICD-10-CM

## 2018-08-10 DIAGNOSIS — I2510 Atherosclerotic heart disease of native coronary artery without angina pectoris: Secondary | ICD-10-CM | POA: Diagnosis present

## 2018-08-10 DIAGNOSIS — G4730 Sleep apnea, unspecified: Secondary | ICD-10-CM

## 2018-08-11 ENCOUNTER — Emergency Department: Payer: Medicare Other

## 2018-08-11 ENCOUNTER — Other Ambulatory Visit: Payer: Self-pay

## 2018-08-11 ENCOUNTER — Encounter: Payer: Self-pay | Admitting: Internal Medicine

## 2018-08-11 ENCOUNTER — Encounter: Payer: Self-pay | Admitting: Anesthesiology

## 2018-08-11 DIAGNOSIS — N2581 Secondary hyperparathyroidism of renal origin: Secondary | ICD-10-CM | POA: Diagnosis present

## 2018-08-11 DIAGNOSIS — Z01810 Encounter for preprocedural cardiovascular examination: Secondary | ICD-10-CM | POA: Diagnosis not present

## 2018-08-11 DIAGNOSIS — J449 Chronic obstructive pulmonary disease, unspecified: Secondary | ICD-10-CM | POA: Diagnosis present

## 2018-08-11 DIAGNOSIS — I359 Nonrheumatic aortic valve disorder, unspecified: Secondary | ICD-10-CM | POA: Diagnosis present

## 2018-08-11 DIAGNOSIS — N186 End stage renal disease: Secondary | ICD-10-CM

## 2018-08-11 DIAGNOSIS — F419 Anxiety disorder, unspecified: Secondary | ICD-10-CM | POA: Diagnosis present

## 2018-08-11 DIAGNOSIS — Y92009 Unspecified place in unspecified non-institutional (private) residence as the place of occurrence of the external cause: Secondary | ICD-10-CM | POA: Diagnosis not present

## 2018-08-11 DIAGNOSIS — I5032 Chronic diastolic (congestive) heart failure: Secondary | ICD-10-CM

## 2018-08-11 DIAGNOSIS — I951 Orthostatic hypotension: Secondary | ICD-10-CM | POA: Diagnosis present

## 2018-08-11 DIAGNOSIS — E1122 Type 2 diabetes mellitus with diabetic chronic kidney disease: Secondary | ICD-10-CM | POA: Diagnosis present

## 2018-08-11 DIAGNOSIS — E1165 Type 2 diabetes mellitus with hyperglycemia: Secondary | ICD-10-CM | POA: Diagnosis present

## 2018-08-11 DIAGNOSIS — E785 Hyperlipidemia, unspecified: Secondary | ICD-10-CM | POA: Diagnosis present

## 2018-08-11 DIAGNOSIS — E778 Other disorders of glycoprotein metabolism: Secondary | ICD-10-CM | POA: Diagnosis present

## 2018-08-11 DIAGNOSIS — S72141A Displaced intertrochanteric fracture of right femur, initial encounter for closed fracture: Secondary | ICD-10-CM | POA: Diagnosis present

## 2018-08-11 DIAGNOSIS — I25118 Atherosclerotic heart disease of native coronary artery with other forms of angina pectoris: Secondary | ICD-10-CM | POA: Diagnosis not present

## 2018-08-11 DIAGNOSIS — G4733 Obstructive sleep apnea (adult) (pediatric): Secondary | ICD-10-CM | POA: Diagnosis present

## 2018-08-11 DIAGNOSIS — E039 Hypothyroidism, unspecified: Secondary | ICD-10-CM | POA: Diagnosis present

## 2018-08-11 DIAGNOSIS — I69320 Aphasia following cerebral infarction: Secondary | ICD-10-CM | POA: Diagnosis not present

## 2018-08-11 DIAGNOSIS — W1830XA Fall on same level, unspecified, initial encounter: Secondary | ICD-10-CM | POA: Diagnosis present

## 2018-08-11 DIAGNOSIS — D696 Thrombocytopenia, unspecified: Secondary | ICD-10-CM | POA: Diagnosis present

## 2018-08-11 DIAGNOSIS — D631 Anemia in chronic kidney disease: Secondary | ICD-10-CM | POA: Diagnosis present

## 2018-08-11 DIAGNOSIS — I701 Atherosclerosis of renal artery: Secondary | ICD-10-CM | POA: Diagnosis present

## 2018-08-11 DIAGNOSIS — I132 Hypertensive heart and chronic kidney disease with heart failure and with stage 5 chronic kidney disease, or end stage renal disease: Secondary | ICD-10-CM | POA: Diagnosis present

## 2018-08-11 DIAGNOSIS — E871 Hypo-osmolality and hyponatremia: Secondary | ICD-10-CM | POA: Diagnosis present

## 2018-08-11 DIAGNOSIS — R0902 Hypoxemia: Secondary | ICD-10-CM | POA: Diagnosis present

## 2018-08-11 DIAGNOSIS — Z6841 Body Mass Index (BMI) 40.0 and over, adult: Secondary | ICD-10-CM | POA: Diagnosis not present

## 2018-08-11 DIAGNOSIS — I2510 Atherosclerotic heart disease of native coronary artery without angina pectoris: Secondary | ICD-10-CM | POA: Diagnosis present

## 2018-08-11 DIAGNOSIS — I252 Old myocardial infarction: Secondary | ICD-10-CM | POA: Diagnosis not present

## 2018-08-11 LAB — PREALBUMIN: Prealbumin: 18.8 mg/dL (ref 18–38)

## 2018-08-11 LAB — COMPREHENSIVE METABOLIC PANEL
ALT: 14 U/L (ref 0–44)
AST: 25 U/L (ref 15–41)
Albumin: 3.3 g/dL — ABNORMAL LOW (ref 3.5–5.0)
Alkaline Phosphatase: 50 U/L (ref 38–126)
Anion gap: 8 (ref 5–15)
BUN: 16 mg/dL (ref 8–23)
CO2: 26 mmol/L (ref 22–32)
Calcium: 8.1 mg/dL — ABNORMAL LOW (ref 8.9–10.3)
Chloride: 99 mmol/L (ref 98–111)
Creatinine, Ser: 2.2 mg/dL — ABNORMAL HIGH (ref 0.44–1.00)
GFR calc Af Amer: 24 mL/min — ABNORMAL LOW (ref >60)
GFR calc non Af Amer: 20 mL/min — ABNORMAL LOW (ref >60)
Glucose, Bld: 147 mg/dL — ABNORMAL HIGH (ref 70–99)
Potassium: 3.6 mmol/L (ref 3.5–5.1)
Sodium: 133 mmol/L — ABNORMAL LOW (ref 135–145)
Total Bilirubin: 0.8 mg/dL (ref 0.3–1.2)
Total Protein: 5.6 g/dL — ABNORMAL LOW (ref 6.5–8.1)

## 2018-08-11 LAB — GLUCOSE, CAPILLARY
Glucose-Capillary: 124 mg/dL — ABNORMAL HIGH (ref 70–99)
Glucose-Capillary: 117 mg/dL — ABNORMAL HIGH (ref 70–99)
Glucose-Capillary: 106 mg/dL — ABNORMAL HIGH (ref 70–99)
Glucose-Capillary: 115 mg/dL — ABNORMAL HIGH (ref 70–99)

## 2018-08-11 LAB — CBC WITH DIFFERENTIAL/PLATELET
Abs Immature Granulocytes: 0.06 10*3/uL (ref 0.00–0.07)
Basophils Absolute: 0 10*3/uL (ref 0.0–0.1)
Basophils Relative: 0 %
Eosinophils Absolute: 0.3 10*3/uL (ref 0.0–0.5)
Eosinophils Relative: 3 %
HCT: 29.3 % — ABNORMAL LOW (ref 36.0–46.0)
Hemoglobin: 9.8 g/dL — ABNORMAL LOW (ref 12.0–15.0)
Immature Granulocytes: 1 %
Lymphocytes Relative: 11 %
Lymphs Abs: 1.1 10*3/uL (ref 0.7–4.0)
MCH: 35.9 pg — AB (ref 26.0–34.0)
MCHC: 33.4 g/dL (ref 30.0–36.0)
MCV: 107.3 fL — AB (ref 80.0–100.0)
MONO ABS: 0.6 10*3/uL (ref 0.1–1.0)
Monocytes Relative: 6 %
Neutro Abs: 8 10*3/uL — ABNORMAL HIGH (ref 1.7–7.7)
Neutrophils Relative %: 79 %
Platelets: 90 10*3/uL — ABNORMAL LOW (ref 150–400)
RBC: 2.73 MIL/uL — ABNORMAL LOW (ref 3.87–5.11)
RDW: 13.2 % (ref 11.5–15.5)
WBC: 10.2 10*3/uL (ref 4.0–10.5)
nRBC: 0 % (ref 0.0–0.2)

## 2018-08-11 LAB — PROTIME-INR
INR: 1.08
Prothrombin Time: 13.9 seconds (ref 11.4–15.2)

## 2018-08-11 LAB — TYPE AND SCREEN
ABO/RH(D): O POS
Antibody Screen: NEGATIVE

## 2018-08-11 LAB — FOLATE: Folate: 31 ng/mL (ref >5.9)

## 2018-08-11 LAB — APTT: aPTT: 31 seconds (ref 24–36)

## 2018-08-11 LAB — PHOSPHORUS: Phosphorus: 2.7 mg/dL (ref 2.5–4.6)

## 2018-08-11 LAB — VITAMIN B12: Vitamin B-12: 409 pg/mL (ref 180–914)

## 2018-08-11 LAB — MAGNESIUM: Magnesium: 1.8 mg/dL (ref 1.7–2.4)

## 2018-08-11 MED ORDER — INSULIN ASPART 100 UNIT/ML ~~LOC~~ SOLN
0.0000 [IU] | Freq: Three times a day (TID) | SUBCUTANEOUS | Status: DC
  Administered 2018-08-12 – 2018-08-14 (×2): 3 [IU] via SUBCUTANEOUS
  Administered 2018-08-14 – 2018-08-19 (×7): 2 [IU] via SUBCUTANEOUS
  Administered 2018-08-20: 3 [IU] via SUBCUTANEOUS
  Administered 2018-08-20 – 2018-08-21 (×2): 2 [IU] via SUBCUTANEOUS
  Filled 2018-08-11 (×12): qty 1

## 2018-08-11 MED ORDER — HEPARIN SODIUM (PORCINE) 5000 UNIT/ML IJ SOLN
5000.0000 [IU] | Freq: Three times a day (TID) | INTRAMUSCULAR | Status: DC
  Administered 2018-08-11 (×2): 5000 [IU] via SUBCUTANEOUS
  Filled 2018-08-11: qty 1

## 2018-08-11 MED ORDER — IRBESARTAN 150 MG PO TABS
300.0000 mg | ORAL_TABLET | Freq: Every day | ORAL | Status: DC
  Administered 2018-08-11: 300 mg via ORAL
  Filled 2018-08-11: qty 2

## 2018-08-11 MED ORDER — ONDANSETRON HCL 4 MG PO TABS: 4.0000 mg | ORAL_TABLET | Freq: Four times a day (QID) | ORAL | Status: DC | PRN

## 2018-08-11 MED ORDER — FERROUS FUMARATE 324 (106 FE) MG PO TABS
1.0000 | ORAL_TABLET | Freq: Every day | ORAL | Status: DC
  Administered 2018-08-11 – 2018-08-21 (×10): 106 mg via ORAL
  Filled 2018-08-11 (×11): qty 1

## 2018-08-11 MED ORDER — BISACODYL 5 MG PO TBEC
5.0000 mg | DELAYED_RELEASE_TABLET | Freq: Every day | ORAL | Status: DC | PRN
  Administered 2018-08-16: 5 mg via ORAL
  Filled 2018-08-11: qty 1

## 2018-08-11 MED ORDER — CEFAZOLIN SODIUM-DEXTROSE 2-4 GM/100ML-% IV SOLN
2.0000 g | INTRAVENOUS | Status: AC
  Administered 2018-08-17: 2 g via INTRAVENOUS
  Filled 2018-08-11: qty 100

## 2018-08-11 MED ORDER — TRAZODONE HCL 50 MG PO TABS
50.0000 mg | ORAL_TABLET | Freq: Every day | ORAL | Status: DC
  Administered 2018-08-11 – 2018-08-20 (×9): 50 mg via ORAL
  Filled 2018-08-11 (×9): qty 1

## 2018-08-11 MED ORDER — MORPHINE SULFATE (PF) 4 MG/ML IV SOLN
4.0000 mg | Freq: Once | INTRAVENOUS | Status: AC
  Administered 2018-08-11: 4 mg via INTRAVENOUS
  Filled 2018-08-11: qty 1

## 2018-08-11 MED ORDER — VITAMIN D 25 MCG (1000 UNIT) PO TABS
5000.0000 [IU] | ORAL_TABLET | Freq: Every day | ORAL | Status: DC
  Administered 2018-08-13 – 2018-08-21 (×7): 5000 [IU] via ORAL
  Filled 2018-08-11 (×9): qty 5

## 2018-08-11 MED ORDER — INSULIN ASPART 100 UNIT/ML ~~LOC~~ SOLN: 0.0000 [IU] | Freq: Every day | SUBCUTANEOUS | Status: DC

## 2018-08-11 MED ORDER — ROSUVASTATIN CALCIUM 10 MG PO TABS
40.0000 mg | ORAL_TABLET | Freq: Every evening | ORAL | Status: DC
  Administered 2018-08-11 – 2018-08-14 (×4): 40 mg via ORAL
  Filled 2018-08-11 (×4): qty 4

## 2018-08-11 MED ORDER — ONDANSETRON HCL 4 MG/2ML IJ SOLN
4.0000 mg | INTRAMUSCULAR | Status: AC
  Administered 2018-08-11: 4 mg via INTRAVENOUS
  Filled 2018-08-11: qty 2

## 2018-08-11 MED ORDER — FERROUS FUM-IRON POLYSACCH 162-115.2 MG PO CAPS: 1.0000 | ORAL_CAPSULE | Freq: Every day | ORAL | Status: DC

## 2018-08-11 MED ORDER — ALBUTEROL SULFATE (2.5 MG/3ML) 0.083% IN NEBU
2.5000 mg | INHALATION_SOLUTION | Freq: Four times a day (QID) | RESPIRATORY_TRACT | Status: DC | PRN
  Administered 2018-08-13: 2.5 mg via RESPIRATORY_TRACT
  Filled 2018-08-11: qty 3

## 2018-08-11 MED ORDER — ASPIRIN EC 81 MG PO TBEC
81.0000 mg | DELAYED_RELEASE_TABLET | Freq: Every day | ORAL | Status: DC
  Administered 2018-08-13: 81 mg via ORAL
  Filled 2018-08-11 (×4): qty 1

## 2018-08-11 MED ORDER — POLYSACCHARIDE IRON COMPLEX 150 MG PO CAPS
150.0000 mg | ORAL_CAPSULE | Freq: Every day | ORAL | Status: DC
  Administered 2018-08-11 – 2018-08-21 (×10): 150 mg via ORAL
  Filled 2018-08-11 (×11): qty 1

## 2018-08-11 MED ORDER — CARVEDILOL 25 MG PO TABS
25.0000 mg | ORAL_TABLET | ORAL | Status: DC
  Administered 2018-08-13 – 2018-08-20 (×3): 25 mg via ORAL
  Filled 2018-08-11 (×3): qty 1

## 2018-08-11 MED ORDER — FLUOXETINE HCL 20 MG PO CAPS
20.0000 mg | ORAL_CAPSULE | Freq: Every day | ORAL | Status: DC
  Administered 2018-08-11 – 2018-08-21 (×10): 20 mg via ORAL
  Filled 2018-08-11 (×11): qty 1

## 2018-08-11 MED ORDER — ISOSORBIDE MONONITRATE ER 30 MG PO TB24
30.0000 mg | ORAL_TABLET | Freq: Every day | ORAL | Status: DC
  Administered 2018-08-11 – 2018-08-20 (×8): 30 mg via ORAL
  Filled 2018-08-11 (×8): qty 1

## 2018-08-11 MED ORDER — CLOPIDOGREL BISULFATE 75 MG PO TABS: 75.0000 mg | ORAL_TABLET | Freq: Every day | ORAL | Status: DC

## 2018-08-11 MED ORDER — ONDANSETRON HCL 4 MG/2ML IJ SOLN
4.0000 mg | Freq: Four times a day (QID) | INTRAMUSCULAR | Status: DC | PRN
  Administered 2018-08-18: 4 mg via INTRAVENOUS
  Filled 2018-08-11: qty 2

## 2018-08-11 MED ORDER — LEVOTHYROXINE SODIUM 50 MCG PO TABS
75.0000 ug | ORAL_TABLET | Freq: Every day | ORAL | Status: DC
  Administered 2018-08-11 – 2018-08-21 (×10): 75 ug via ORAL
  Filled 2018-08-11 (×10): qty 1

## 2018-08-11 MED ORDER — TRANEXAMIC ACID 1000 MG/10ML IV SOLN
2000.0000 mg | Freq: Once | INTRAVENOUS | Status: DC
  Filled 2018-08-11: qty 20

## 2018-08-11 MED ORDER — ACETAMINOPHEN 325 MG PO TABS
650.0000 mg | ORAL_TABLET | Freq: Four times a day (QID) | ORAL | Status: DC | PRN
  Administered 2018-08-12 – 2018-08-20 (×9): 650 mg via ORAL
  Filled 2018-08-11 (×8): qty 2

## 2018-08-11 MED ORDER — HYDROMORPHONE HCL 1 MG/ML IJ SOLN
0.5000 mg | INTRAMUSCULAR | Status: DC | PRN
  Administered 2018-08-11 – 2018-08-13 (×6): 0.5 mg via INTRAVENOUS
  Filled 2018-08-11 (×7): qty 1

## 2018-08-11 MED ORDER — ACETAMINOPHEN 650 MG RE SUPP: 650.0000 mg | Freq: Four times a day (QID) | RECTAL | Status: DC | PRN

## 2018-08-11 MED ORDER — HYDRALAZINE HCL 50 MG PO TABS
50.0000 mg | ORAL_TABLET | Freq: Two times a day (BID) | ORAL | Status: DC
  Administered 2018-08-12 – 2018-08-21 (×12): 50 mg via ORAL
  Filled 2018-08-11 (×14): qty 1

## 2018-08-11 MED ORDER — SODIUM CHLORIDE 0.9 % IV SOLN
INTRAVENOUS | Status: DC
  Administered 2018-08-11 (×2): via INTRAVENOUS

## 2018-08-11 MED ORDER — SENNOSIDES-DOCUSATE SODIUM 8.6-50 MG PO TABS
1.0000 | ORAL_TABLET | Freq: Every evening | ORAL | Status: DC | PRN
  Administered 2018-08-16: 1 via ORAL
  Filled 2018-08-11: qty 1

## 2018-08-11 NOTE — H&P (Signed)
[facility] [place] NAME: Dawn Bradford    MR#:  [id number]  DATE OF BIRTH:  30-Mar-1936  DATE OF ADMISSION:  08/10/2018  PRIMARY CARE PHYSICIAN: Hortencia Pilar, MD   REQUESTING/REFERRING PHYSICIAN: Hinda Kehr, MD  CHIEF COMPLAINT:   Chief Complaint  Patient presents with  . Fall    HISTORY OF PRESENT ILLNESS:  Dawn Bradford  is a 82 y.o. female with a known history of T2NIDDM (w/ neuropathy + nephropathy), HTN, HLD, CAD/MI (s/p CABG 1996), Hx chronic diastolic CHF (EF 13-08% w/o diastolic dysfxn as of 65/78/4696 Echo), CVA (w/ expressive aphasia), ESRD (HD M/W/F via LUE AVF) p/w mechanical fall, R hip Fx. Ambulates w/ walker at baseline. AAOx3 but has expressive aphasia at baseline 2/2 CVA, pt asks daughter to provide Hx/limited ROS. Daughter at bedside is Dawn Bradford, [phone]. She says she and her two sisters share DPOA. Pt was apparently in the bathroom, and was at the sink. Mechanics of fall are unclear, but she did "a split". Fall was unwitnessed, pt's husband was at home w/ pt but her daughters were not there. Pt called to husband, who pushed pt's LifeAlert. Apparently he fell on the way to help her as well. Daughter says pt walks w/ walker and her father walks w/ cane. Pt endorsed R hip pain; RLE shortened and externally rotated on exam. XR R hip (+) "Intertrochanteric fracture of the proximal right femur with superior migration of the distal fracture fragment." Not in distress. Pt w/ litany of medical issues, suspected to be at high risk for perioperative complication/morbidity/mortality.  PAST MEDICAL HISTORY:   Past Medical History:  Diagnosis Date  . ACE-inhibitor cough   . Aortic valve disorder    Echo in 2009 showed mean aortic valve gradient of 13 mmHg, suggesting very mild stenosis. Echo (12/10) suggested aortic sclerosis only  . Carotid artery disease (HCC)    mild, carotid dopplers 12/2009  . CHF (congestive heart  failure) ([place])   . COPD (chronic obstructive pulmonary disease) ([place])   . Coronary artery disease    s/p anterior MI in 1996 followed by CABG. Lexiscan myoview (4/11): EF 67%, normal perfusion with no evidence for ischemia or infarction.   . Diabetes mellitus    type 2  . Diastolic heart failure    most recent echo (12/10) showed EF 55-60% with mild LVH, grade I diastolic dysfunction, mild LAE.  [name] ESRD (end stage renal disease) on dialysis ([place])   . Hyperlipidemia   . Hypertension    resistant hyptertension times many years. the patient does have renal artery stenosis. she has tried calcium channel blockers in the past and states that she would not take them now because she had some problems with her gums which her dentist identified as calcium-channel blocker side effects.  . Hypothyroidism   . Mild asthma    PFT 02/05/10 FEV1 1.38, FEV1% 73, TLC 3.78 (86%), DLCO 48%, +BD  . Myocardial infarction ([place])   . Obesity   . Obesity hypoventilation syndrome ([place])       . Obstructive sleep apnea    PSG 01/05/2006 AHI 24.8, CPAP 9cm H2O  . Pulmonary nodule, right   . Renal artery stenosis [facility])    The patient has an occluded right renal artery and an atrophic right kidney. There is 20% left renal artery stenosis. This was seen by catheterization in 2007  . Stroke [facility])     PAST SURGICAL HISTORY:  Past Surgical History:  Procedure Laterality Date  . A/V FISTULAGRAM N/A 01/25/2018   Procedure: A/V FISTULAGRAM;  Surgeon: Algernon Huxley, MD;  Location: [place] [facility];  Service: Cardiovascular;  Laterality: N/A;  . ABDOMINAL HYSTERECTOMY  1985  . AV FISTULA PLACEMENT    . CATARACT EXTRACTION  2010  . CORONARY ARTERY BYPASS GRAFT  1996  . DIALYSIS FISTULA CREATION    . [place]   right  . PERIPHERAL VASCULAR CATHETERIZATION Left 01/06/2015   Procedure: A/V Shuntogram/Fistulagram;  Surgeon: Katha Cabal, MD;  Location: [facility];  Service: Cardiovascular;   Laterality: Left;  . PERIPHERAL VASCULAR CATHETERIZATION N/A 02/17/2015   Procedure: Dialysis/Perma Catheter Removal;  Surgeon: Katha Cabal, MD;  Location: [place] [facility];  Service: Cardiovascular;  Laterality: N/A;  . PERIPHERAL VASCULAR CATHETERIZATION N/A 01/28/2016   Procedure: A/V Shuntogram/Fistulagram;  Surgeon: Algernon Huxley, MD;  Location: [place] [facility];  Service: Cardiovascular;  Laterality: N/A;  . PERIPHERAL VASCULAR CATHETERIZATION N/A 01/28/2016   Procedure: A/V Shunt Intervention;  Surgeon: Algernon Huxley, MD;  Location: [place];  Service: Cardiovascular;  Laterality: N/A;  . TUBAL LIGATION  1973    SOCIAL HISTORY:   Social History   Tobacco Use  . Smoking status: Never Smoker  . Smokeless tobacco: Never Used  Substance Use Topics  . Alcohol use: No    FAMILY HISTORY:   Family History  Problem Relation Age of Onset  . Kidney failure Mother   . Diabetes Mother   . Aortic aneurysm Father   . Coronary artery disease Father   . Heart attack Father   . Diabetes Sister   . Diabetes Brother   . Kidney failure Brother     DRUG ALLERGIES:   Allergies  Allergen Reactions  . Ace Inhibitors Other (See Comments)    Reaction:  Unknown   . Amlodipine Other (See Comments)    Reaction:  Unknown   . Clonidine Hydrochloride Other (See Comments)    Reaction:  Unknown   . Codeine Hives  . Latex Hives  . Metformin Nausea And Vomiting  . Tape Rash    REVIEW OF SYSTEMS:   Review of Systems  Unable to perform ROS: Other  Musculoskeletal: Positive for falls and joint pain.   Expressive aphasia 2/2 prior CVA, limited ROS. (+) fall, R hip pain. MEDICATIONS AT HOME:   Prior to Admission medications   Medication Sig Start Date End Date Taking? Authorizing Provider  acetaminophen (TYLENOL) 500 MG tablet Take 1,000 mg by mouth every 6 (six) hours as needed for mild pain.    [provider]  albuterol (PROVENTIL HFA;VENTOLIN HFA) 108 (90  BASE) MCG/ACT inhaler Inhale 2 puffs into the lungs every 6 (six) hours as needed for wheezing or shortness of breath.    [provider]  aspirin EC 81 MG tablet Take 81 mg by mouth daily.    [provider]  b complex-vitamin c-folic acid (NEPHRO-VITE) 0.8 MG TABS tablet Take 1 tablet by mouth daily.    [provider]  carvedilol (COREG) 25 MG tablet Take 25 mg by mouth 2 (two) times daily.     [provider]  Cholecalciferol (VITAMIN D3) 5000 UNITS TABS Take 5,000 Units by mouth daily.    [provider]  clopidogrel (PLAVIX) 75 MG tablet Take 1 tablet (75 mg total) by mouth daily. 02/03/15   Theodoro Grist, MD  ferrous fumarate-iron polysaccharide  complex (TANDEM) 162-115.2 MG CAPS capsule Take 1 capsule by mouth daily.    [provider]  FLUoxetine (PROZAC) 20 MG tablet Take 20 mg by mouth daily.  08/25/17   [provider]  furosemide (LASIX) 40 MG tablet Take 20-80 mg by mouth. Sunday, Tuesday and Saturday pt takes 80 mg in the morning. Thursdays 20 mg only. Tuesdays pt may take lower dose. 07/11/17   [provider]  gabapentin (NEURONTIN) 100 MG capsule 100 mg.  07/10/17   [provider]  hydrALAZINE (APRESOLINE) 50 MG tablet Take 50 mg by mouth 2 (two) times daily.  07/10/17   [provider]  IRON PO Take by mouth.    [provider]  isosorbide mononitrate (IMDUR) 30 MG 24 hr tablet Take 30 mg by mouth at bedtime.  07/10/17   [provider]  lactulose (CHRONULAC) 10 GM/15ML solution Place rectally.    [provider]  levothyroxine (SYNTHROID, LEVOTHROID) 75 MCG tablet Take 75 mcg by mouth daily.     [provider]  lidocaine-prilocaine (EMLA) cream Apply 1 application topically as needed (for pain).    [provider]  nystatin (MYCOSTATIN/NYSTOP) 100000 UNIT/GM POWD Apply 1 g topically as needed (for irritation). Reported on 01/28/2016    [provider]  rosuvastatin (CRESTOR) 40 MG tablet Take 40 mg by mouth every evening.  07/11/17   [provider]  telmisartan (MICARDIS) 80 MG tablet Take 80 mg by mouth daily. At night    [provider]  traZODone (DESYREL) 50 MG tablet Take 50 mg by mouth at bedtime.    [provider]  triamcinolone (KENALOG) 0.5 % cream Apply 1 application topically as needed (for irritation).     [provider]      VITAL SIGNS:  Blood pressure (!) 150/70, pulse 66, temperature 97.6 F (36.4 C), temperature source Oral, resp. rate 20, height 5\' 3"  (1.6 m), weight 103.3 kg, last menstrual period 02/01/1986, SpO2 96 %.  PHYSICAL EXAMINATION:  Physical Exam  Constitutional: She appears well-developed and well-nourished. She is active and cooperative.  Non-toxic appearance. She does not have a sickly appearance. She does not appear ill. No distress. She is not intubated.  HENT:  Head: Atraumatic.  Mouth/Throat: Oropharynx is clear and moist. No oropharyngeal exudate.  Eyes: Conjunctivae, EOM and lids are normal. No scleral icterus.  Neck: Neck supple. No JVD present. No thyromegaly present.  Cardiovascular: Normal rate, S1 normal, S2 normal and normal heart sounds. An irregularly irregular rhythm present. Exam reveals no gallop, no S3, no S4, no distant heart sounds and no friction rub.  No murmur heard. Pulmonary/Chest: Effort normal and breath sounds normal. No accessory muscle usage or stridor. No apnea, no tachypnea and no bradypnea. She is not intubated. No respiratory distress. She has no decreased breath sounds. She has no wheezes. She has no rhonchi. She has no rales.  Abdominal: Soft. She exhibits no distension. Bowel sounds are decreased. There is no tenderness. There is no rigidity, no rebound and no guarding.  Musculoskeletal: Normal range of motion. She exhibits tenderness and deformity. She exhibits no edema.  Lymphadenopathy:    She has no cervical  adenopathy.  Neurological: She is alert.  (+) expressive aphasia (2/2 prior CVA).  Skin: Skin is warm and dry. She is not diaphoretic. No erythema.  Psychiatric: She has a normal mood and affect. Her behavior is normal. Judgment and thought content normal.  (+) expressive aphasia (2/2 prior CVA).  RLE shortened, externally rotated, immobile, (+) TTP. LUE AVF. Irregularly irregular, rate normal. LABORATORY PANEL:   CBC Recent Labs  Lab 08/11/18 0057  WBC 10.2  HGB 9.8*  HCT 29.3*  PLT 90*   ------------------------------------------------------------------------------------------------------------------  Chemistries  Recent Labs  Lab 08/11/18 0057  NA 133*  K 3.6  CL 99  CO2 26  GLUCOSE 147*  BUN 16  CREATININE 2.20*  CALCIUM 8.1*  AST 25  ALT 14  ALKPHOS 50  BILITOT 0.8   ------------------------------------------------------------------------------------------------------------------  Cardiac Enzymes No results for input(s): TROPONINI in the last 168 hours. ------------------------------------------------------------------------------------------------------------------  RADIOLOGY:  Dg Chest 1 View  Result Date: 08/11/2018 CLINICAL DATA:  Fall EXAM: CHEST  1 VIEW COMPARISON:  Radiograph 10/14/2017 FINDINGS: Sternotomy wires overlie normal cardiac silhouette. Mild central venous congestion similar prior. No infiltrate. No pneumothorax. IMPRESSION: Mild central venous congestion. Midline sternotomy. Electronically Signed   By: Suzy Bouchard M.D.   On: 08/11/2018 01:17   Dg Hip Unilat W Or Wo Pelvis 2-3 Views Right  Result Date: 08/11/2018 CLINICAL DATA:  Fall, hip pain EXAM: DG HIP (WITH OR WITHOUT PELVIS) 2-3V RIGHT COMPARISON:  None. FINDINGS: Intertrochanteric fracture of the proximal RIGHT femur with superior migration of the distal fracture fragment. IMPRESSION: Acute intertrochanteric RIGHT femur fracture Electronically Signed   By: Suzy Bouchard M.D.    On: 08/11/2018 01:16   IMPRESSION AND PLAN:   A/P: 51F w/ PMHx T2NIDDM (w/ neuropathy + nephropathy), HTN, HLD, CAD/MI (s/p CABG 1996), Hx chronic diastolic CHF (EF 29-56% w/o diastolic dysfxn as of 21/30/8657 Echo), CVA (w/ expressive aphasia), ESRD (HD M/W/F via LUE AVF) p/w mechanical fall, R hip Fx. Hyponatremia, hyperglycemia (w/ T2NIDDM), hypocalcemia, hypoalbuminemia/hypoproteinemia, macrocytic anemia, thrombocytopenia. -R hip Fx: 1d Hx fall. XR R hip (+) "Intertrochanteric fracture of the proximal right femur with superior migration of the distal fracture fragment." Orthopedic Surgery (Dr. Harlow Mares) contacted from ED. Pt not in acute distress. Pt w/ litany of medical issues, expected to be at high risk for perioperative complication/morbidity/mortality. She has T2IDDM, CAD/MI (s/p CABG), CVA and ESRD (HD). She is on aspirin and Plavix. Albumin is 3.3. Platelet is 90. She is at risk of bleeding, perioperative infection, impaired fracture/wound healing and/or cardiovascular mortality. Should it be determined that she is an operative candidate, I would characterize her as having high cardiovascular risk for a low-to-moderate risk non-cardiovascular orthopedic procedure. I do not know that additional cardiodiagnostics would reduce risk for cardiovascular morbidity/mortality, given that she has already exceeded [place] average life expectancy. Has seen Dr. Fletcher Anon in the past, will consult Cardiology. -ESRD: ESRD, HD M/W/F via LUE. Had full HD Friday 12/06. Nephrology consult, may benefit from extra session if deemed appropriate for operative management. -Hyponatremia, hypoproteinemia, hypoalbuminemia: Does not appear volume overloaded. Had full HD on Friday 12/06. May be due to low solute intake state, malnourishment. Prealbumin. -Hyperglycemia, T2NIDDM: SSI. -Macrocytic anemia: B12, Folate pending. -Thrombocytopenia: Chronic, > 5yrs. Monitor. -c/w other home meds/formulary subs. -FEN/GI: NPO. -DVT PPx:  Heparin. -Code status: Full code. Pt's daughter states living will/advance directives documentation at home. She is unsure of code status. She will go home and retrieve the document during the day. Pt designated full code until daughter reviews document. -Disposition: Admission, > 2 midnights.   All the records are reviewed and case discussed with ED provider. Management plans discussed with the patient, family and they are in agreement.  CODE STATUS: Full code.  TOTAL TIME TAKING CARE OF THIS PATIENT: 75 minutes.    Dickie Cloe  Christiaan Strebeck M.D on 08/11/2018 at 2:27 AM  Between 7am to 6pm - Pager - [phone]  After 6pm go to [email or web address] - Proofreader  [facility]  Office  [phone]  CC: Primary care physician; Hortencia Pilar, MD   Note: This dictation was prepared with Dragon dictation along with smaller phrase technology. Any transcriptional errors that result from this process are unintentional.

## 2018-08-11 NOTE — Progress Notes (Signed)
[facility]  ROUNDING NOTE   Subjective:  Patient well-known to [place] as we follow her for outpatient hemodialysis. She experienced a fall at home and unfortunately sustained a right hip intertrochanteric fracture. Patient did undergo complete dialysis treatment yesterday as an outpatient. Currently her serum electro lites are acceptable with a potassium of 3.6 and serum bicarbonate of 26.  Objective:  Vital signs in last 24 hours:  Temp:  [97.6 F (36.4 C)-97.9 F (36.6 C)] 97.9 F (36.6 C) (12/07 0726) Pulse Rate:  [66-69] 69 (12/07 0726) Resp:  [15-20] 20 (12/07 0226) BP: (134-161)/(54-70) 144/54 (12/07 0726) SpO2:  [93 %-98 %] 98 % (12/07 0726) Weight:  [103.3 kg] 103.3 kg (12/07 0009)  Weight change:  Filed Weights   08/11/18 0009  Weight: 103.3 kg    Intake/Output: I/O last 3 completed shifts: In: 428.2 [I.V.:428.2] Out: -    Intake/Output this shift:  No intake/output data recorded.  Physical Exam: General: No acute distress  Head: Normocephalic, atraumatic. Moist oral mucosal membranes  Eyes: Anicteric  Neck: Supple, trachea midline  Lungs:  Clear to auscultation, normal effort  Heart: S1S2 no rubs  Abdomen:  Soft, nontender, bowel sounds present  Extremities: Trace peripheral edema.  Neurologic: Awake, alert, following commands  Skin: No lesions  Access: LUE AVF    Basic Metabolic Panel: Recent Labs  Lab 08/11/18 0057  NA 133*  K 3.6  CL 99  CO2 26  GLUCOSE 147*  BUN 16  CREATININE 2.20*  CALCIUM 8.1*  MG 1.8  PHOS 2.7    Liver Function Tests: Recent Labs  Lab 08/11/18 0057  AST 25  ALT 14  ALKPHOS 50  BILITOT 0.8  PROT 5.6*  ALBUMIN 3.3*   No results for input(s): LIPASE, AMYLASE in the last 168 hours. No results for input(s): AMMONIA in the last 168 hours.  CBC: Recent Labs  Lab 08/11/18 0057  WBC 10.2  NEUTROABS 8.0*  HGB 9.8*  HCT 29.3*  MCV 107.3*  PLT 90*    Cardiac Enzymes: No results for input(s):  CKTOTAL, CKMB, CKMBINDEX, TROPONINI in the last 168 hours.  BNP: Invalid input(s): POCBNP  CBG: Recent Labs  Lab 08/11/18 0729 08/11/18 1121  GLUCAP 124* 117*    Microbiology: Results for orders placed or performed during the hospital encounter of 10/11/17  MRSA PCR Screening     Status: None   Collection Time: 10/14/17  5:30 PM  Result Value Ref Range Status   MRSA by PCR NEGATIVE NEGATIVE Final    Comment:        The GeneXpert MRSA Assay (FDA approved for NASAL specimens only), is one component of a comprehensive MRSA colonization surveillance program. It is not intended to diagnose MRSA infection nor to guide or monitor treatment for MRSA infections. Performed at [facility], [address] [place], [place] [address]     Coagulation Studies: Recent Labs    08/11/18 0057  LABPROT 13.9  INR 1.08    Urinalysis: No results for input(s): COLORURINE, LABSPEC, PHURINE, GLUCOSEU, HGBUR, BILIRUBINUR, KETONESUR, PROTEINUR, UROBILINOGEN, NITRITE, LEUKOCYTESUR in the last 72 hours.  Invalid input(s): APPERANCEUR    Imaging: Dg Chest 1 View  Result Date: 08/11/2018 CLINICAL DATA:  Fall EXAM: CHEST  1 VIEW COMPARISON:  Radiograph 10/14/2017 FINDINGS: Sternotomy wires overlie normal cardiac silhouette. Mild central venous congestion similar prior. No infiltrate. No pneumothorax. IMPRESSION: Mild central venous congestion. Midline sternotomy. Electronically Signed   By: Suzy Bouchard M.D.   On: 08/11/2018  01:17   Dg Hip Unilat W Or Wo Pelvis 2-3 Views Right  Result Date: 08/11/2018 CLINICAL DATA:  Fall, hip pain EXAM: DG HIP (WITH OR WITHOUT PELVIS) 2-3V RIGHT COMPARISON:  None. FINDINGS: Intertrochanteric fracture of the proximal RIGHT femur with superior migration of the distal fracture fragment. IMPRESSION: Acute intertrochanteric RIGHT femur fracture Electronically Signed   By: Suzy Bouchard M.D.   On: 08/11/2018 01:16     Medications:   . [START  ON 08/12/2018]  ceFAZolin (ANCEF) IV     . aspirin EC  81 mg Oral Daily  . [START ON 08/13/2018] carvedilol  25 mg Oral Q M,W,F-1800  . cholecalciferol  5,000 Units Oral Daily  . Ferrous Fumarate  1 tablet Oral Daily   And  . iron polysaccharides  150 mg Oral Daily  . FLUoxetine  20 mg Oral Daily  . heparin  5,000 Units Subcutaneous Q8H  . hydrALAZINE  50 mg Oral BID  . insulin aspart  0-15 Units Subcutaneous TID WC  . insulin aspart  0-5 Units Subcutaneous QHS  . irbesartan  300 mg Oral Daily  . isosorbide mononitrate  30 mg Oral QHS  . levothyroxine  75 mcg Oral QAC breakfast  . rosuvastatin  40 mg Oral QPM  . [START ON 08/12/2018] tranexamic acid (CYKLOKAPRON) topical -INTRAOP  2,000 mg Topical Once  . traZODone  50 mg Oral QHS   acetaminophen **OR** acetaminophen, albuterol, bisacodyl, HYDROmorphone (DILAUDID) injection, ondansetron **OR** ondansetron (ZOFRAN) IV, senna-docusate  Assessment/ Plan:  82 y.o. female with end-stage renal disease, coronary disease, carotid stenosis, history of stroke, diabetes, CABG, obstructive sleep apnea, history of renal artery stenosis, admitfed for right hip fracture 08/10/18.  [employer] [facility]/MWF/[facility]/99.5  1.  ESRD on HD MWF.  Patient did complete her dialysis treatment yesterday.  No acute indication for dialysis today.  We are certainly available tomorrow if the need for dialysis arises.  2.  Anemia of chronic kidney disease.  We will start the patient on Epogen with the next dialysis treatment.  3.  Secondary hyperparathyroidism.  Check intact PTH and phosphorus with next dialysis treatment.  4.  Hypertension.  Continue carvedilol, hydralazine, irbesartan for blood pressure control.   LOS: 0 Cameryn Schum 12/7/20191:50 PM

## 2018-08-11 NOTE — ED Provider Notes (Signed)
[facility] Emergency Department Provider Note  ____________________________________________   First MD Initiated Contact with Patient 08/10/18 2358     (approximate)  I have reviewed the triage vital signs and the nursing notes.   HISTORY  Chief Complaint Fall    HPI Dawn Bradford is a 82 y.o. female with medical history as listed below which notably includes end-stage renal disease on hemodialysis, history of CAD with a prior MI on Plavix and aspirin, sleep apnea, and at least some degree of chronic CHF.  She presents by EMS for evaluation of right hip pain that was acute in onset and severe.  She was poorly coming out of the bathroom and she fell and "did the splits".  She had some obvious deformity of the right hip for EMS.  Currently is externally rotated but it is difficult to appreciate the deformity of the hip.  She reports the pain is mild if she is still in severe if she moves.  She has no numbness nor tingling and can wiggle her toes.  She did not lose consciousness.  She denies headache, neck pain, chest pain, shortness of breath, nausea, vomiting, and abdominal pain.  She had dialysis yesterday.  She uses a CPAP at night.  She has no prior history of orthopedic procedures on her hips.  Past Medical History:  Diagnosis Date  . ACE-inhibitor cough   . Aortic valve disorder    Echo in 2009 showed mean aortic valve gradient of 13 mmHg, suggesting very mild stenosis. Echo (12/10) suggested aortic sclerosis only  . Carotid artery disease (HCC)    mild, carotid dopplers 12/2009  . CHF (congestive heart failure) ([place])   . COPD (chronic obstructive pulmonary disease) ([place])   . Coronary artery disease    s/p anterior MI in 1996 followed by CABG. Lexiscan myoview (4/11): EF 67%, normal perfusion with no evidence for ischemia or infarction.   . Diabetes mellitus    type 2  . Diastolic heart failure    most recent echo (12/10) showed EF 55-60% with mild LVH,  grade I diastolic dysfunction, mild LAE.  [name] ESRD (end stage renal disease) on dialysis ([place])   . Hyperlipidemia   . Hypertension    resistant hyptertension times many years. the patient does have renal artery stenosis. she has tried calcium channel blockers in the past and states that she would not take them now because she had some problems with her gums which her dentist identified as calcium-channel blocker side effects.  . Hypothyroidism   . Mild asthma    PFT 02/05/10 FEV1 1.38, FEV1% 73, TLC 3.78 (86%), DLCO 48%, +BD  . Myocardial infarction ([place])   . Obesity   . Obesity hypoventilation syndrome ([place])       . Obstructive sleep apnea    PSG 01/05/2006 AHI 24.8, CPAP 9cm H2O  . Pulmonary nodule, right   . Renal artery stenosis [facility])    The patient has an occluded right renal artery and an atrophic right kidney. There is 20% left renal artery stenosis. This was seen by catheterization in 2007  . Stroke [facility])     Patient Active Problem List   Diagnosis Date Noted  . Closed intertrochanteric fracture of hip, right, initial encounter ([place]) 08/11/2018  . Hypoxia 10/12/2017  . ESRD on dialysis ([place]) 07/07/2016  . Chest pain 08/19/2015  . Coronary artery disease involving native heart with angina pectoris ([place]) 08/18/2015  . Expressive aphasia 02/03/2015  . Carotid stenosis  02/03/2015  . CVA (cerebral infarction) 01/31/2015  . Diabetes mellitus type 2 in obese ([place]) 01/31/2015  . End stage renal disease ([place]) 01/31/2015  . Mild intermittent asthma 03/29/2010  . CAD, ARTERY BYPASS GRAFT 03/02/2010  . Obesity hypoventilation syndrome ([place]) 02/05/2010  . Shortness of breath 12/24/2009  . CAROTID BRUIT 12/14/2009  . DIASTOLIC HEART FAILURE, CHRONIC 02/17/2009  . DIABETES MELLITUS, TYPE II 01/29/2009  . Hyperlipidemia 01/29/2009  . Obstructive sleep apnea 01/29/2009  . Essential hypertension 01/29/2009  . Aortic valve disorder 01/29/2009  . RENAL ARTERY STENOSIS 01/29/2009  . RENAL  DISEASE, CHRONIC 01/29/2009    Past Surgical History:  Procedure Laterality Date  . A/V FISTULAGRAM N/A 01/25/2018   Procedure: A/V FISTULAGRAM;  Surgeon: Algernon Huxley, MD;  Location: [place] CV LAB;  Service: Cardiovascular;  Laterality: N/A;  . ABDOMINAL HYSTERECTOMY  1985  . AV FISTULA PLACEMENT    . CATARACT EXTRACTION  2010  . CORONARY ARTERY BYPASS GRAFT  1996  . DIALYSIS FISTULA CREATION    . [place]   right  . PERIPHERAL VASCULAR CATHETERIZATION Left 01/06/2015   Procedure: A/V Shuntogram/Fistulagram;  Surgeon: Katha Cabal, MD;  Location: [place] [facility];  Service: Cardiovascular;  Laterality: Left;  . PERIPHERAL VASCULAR CATHETERIZATION N/A 02/17/2015   Procedure: Dialysis/Perma Catheter Removal;  Surgeon: Katha Cabal, MD;  Location: [place] [facility];  Service: Cardiovascular;  Laterality: N/A;  . PERIPHERAL VASCULAR CATHETERIZATION N/A 01/28/2016   Procedure: A/V Shuntogram/Fistulagram;  Surgeon: Algernon Huxley, MD;  Location: [place] [facility];  Service: Cardiovascular;  Laterality: N/A;  . PERIPHERAL VASCULAR CATHETERIZATION N/A 01/28/2016   Procedure: A/V Shunt Intervention;  Surgeon: Algernon Huxley, MD;  Location: [place] [facility];  Service: Cardiovascular;  Laterality: N/A;  . TUBAL LIGATION  1973    Prior to Admission medications   Medication Sig Start Date End Date Taking? Authorizing Provider  acetaminophen (TYLENOL) 500 MG tablet Take 1,000 mg by mouth every 6 (six) hours as needed for mild pain.    [provider]  albuterol (PROVENTIL HFA;VENTOLIN HFA) 108 (90 BASE) MCG/ACT inhaler Inhale 2 puffs into the lungs every 6 (six) hours as needed for wheezing or shortness of breath.    [provider]  aspirin EC 81 MG tablet Take 81 mg by mouth daily.    [provider]  b complex-vitamin c-folic acid (NEPHRO-VITE) 0.8 MG TABS tablet Take 1 tablet by mouth daily.    [provider]  carvedilol  (COREG) 25 MG tablet Take 25 mg by mouth 2 (two) times daily.     [provider]  Cholecalciferol (VITAMIN D3) 5000 UNITS TABS Take 5,000 Units by mouth daily.    [provider]  clopidogrel (PLAVIX) 75 MG tablet Take 1 tablet (75 mg total) by mouth daily. 02/03/15   Theodoro Grist, MD  ferrous fumarate-iron polysaccharide complex (TANDEM) 162-115.2 MG CAPS capsule Take 1 capsule by mouth daily.    [provider]  FLUoxetine (PROZAC) 20 MG tablet Take 20 mg by mouth daily.  08/25/17   [provider]  furosemide (LASIX) 40 MG tablet Take 20-80 mg by mouth. Sunday, Tuesday and Saturday pt takes 80 mg in the morning. Thursdays 20 mg only. Tuesdays pt may take lower dose. 07/11/17   [provider]  gabapentin (NEURONTIN) 100 MG capsule 100 mg.  07/10/17   [provider]  hydrALAZINE (APRESOLINE) 50 MG tablet Take 50 mg by  mouth 2 (two) times daily.  07/10/17   [provider]  IRON PO Take by mouth.    [provider]  isosorbide mononitrate (IMDUR) 30 MG 24 hr tablet Take 30 mg by mouth at bedtime.  07/10/17   [provider]  lactulose (CHRONULAC) 10 GM/15ML solution Place rectally.    [provider]  levothyroxine (SYNTHROID, LEVOTHROID) 75 MCG tablet Take 75 mcg by mouth daily.     [provider]  lidocaine-prilocaine (EMLA) cream Apply 1 application topically as needed (for pain).    [provider]  nystatin (MYCOSTATIN/NYSTOP) 100000 UNIT/GM POWD Apply 1 g topically as needed (for irritation). Reported on 01/28/2016    [provider]  rosuvastatin (CRESTOR) 40 MG tablet Take 40 mg by mouth every evening.  07/11/17   [provider]  telmisartan (MICARDIS) 80 MG tablet Take 80 mg by mouth daily. At night    [provider]  traZODone (DESYREL) 50 MG tablet Take 50 mg by mouth at bedtime.    [provider]  triamcinolone (KENALOG) 0.5 % cream Apply 1  application topically as needed (for irritation).     [provider]    Allergies Ace inhibitors; Amlodipine; Clonidine hydrochloride; Codeine; Latex; Metformin; and Tape  Family History  Problem Relation Age of Onset  . Kidney failure Mother   . Diabetes Mother   . Aortic aneurysm Father   . Coronary artery disease Father   . Heart attack Father   . Diabetes Sister   . Diabetes Brother   . Kidney failure Brother     Social History Social History   Tobacco Use  . Smoking status: Never Smoker  . Smokeless tobacco: Never Used  Substance Use Topics  . Alcohol use: No  . Drug use: No    Review of Systems Constitutional: No fever/chills Eyes: No visual changes. ENT: No sore throat. Cardiovascular: Denies chest pain. Respiratory: Denies shortness of breath. Gastrointestinal: No abdominal pain.  No nausea, no vomiting.  No diarrhea.  No constipation. Genitourinary: Negative for dysuria. Musculoskeletal: Acute onset severe pain in right hip as described above. Integumentary: Negative for rash. Neurological: Negative for headaches, focal weakness or numbness.   ____________________________________________   PHYSICAL EXAM:  VITAL SIGNS: ED Triage Vitals  Enc Vitals Group     BP 08/11/18 0008 (!) 161/60     Pulse Rate 08/11/18 0008 66     Resp 08/11/18 0008 16     Temp 08/11/18 0008 97.7 F (36.5 C)     Temp Source 08/11/18 0008 Oral     SpO2 08/11/18 0008 93 %     Weight 08/11/18 0009 103.3 kg (227 lb 12.8 oz)     Height 08/11/18 0009 1.6 m (5\' 3" )     Head Circumference --      Peak Flow --      Pain Score 08/11/18 0009 3     Pain Loc --      Pain Edu? --      Excl. in [place]? --     Constitutional: Alert and oriented.  Elderly with chronic medical conditions. Eyes: Conjunctivae are normal.  Head: Atraumatic. Nose: No congestion/rhinnorhea. Mouth/Throat: Mucous membranes are moist. Neck: No stridor.  No meningeal signs.  No cervical spine  tenderness to palpation. Cardiovascular: Normal rate, regular rhythm. Good peripheral circulation. Grossly normal heart sounds. Respiratory: Normal respiratory effort.  No retractions. Lungs CTAB. Gastrointestinal: Soft and nontender. No distention.  Musculoskeletal: Externally rotated and slightly shortened  right lower extremity.  Able to wiggle toes.  Tender to palpation of the proximal lateral femur.  Severe tenderness with any attempt at movement of the right lower extremity.  Good distal peripheral pulses and capillary refill. Neurologic:  Normal speech and language. No gross focal neurologic deficits are appreciated.  Skin:  Skin is warm, dry and intact. No rash noted. Psychiatric: Mood and affect are normal. Speech and behavior are normal.  ____________________________________________   LABS (all labs ordered are listed, but only abnormal results are displayed)  Labs Reviewed  COMPREHENSIVE METABOLIC PANEL - Abnormal; Notable for the following components:      Result Value   Sodium 133 (*)    Glucose, Bld 147 (*)    Creatinine, Ser 2.20 (*)    Calcium 8.1 (*)    Total Protein 5.6 (*)    Albumin 3.3 (*)    GFR calc non Af Amer 20 (*)    GFR calc Af Amer 24 (*)    All other components within normal limits  CBC WITH DIFFERENTIAL/PLATELET - Abnormal; Notable for the following components:   RBC 2.73 (*)    Hemoglobin 9.8 (*)    HCT 29.3 (*)    MCV 107.3 (*)    MCH 35.9 (*)    Platelets 90 (*)    Neutro Abs 8.0 (*)    All other components within normal limits  APTT  PROTIME-INR  MAGNESIUM  PHOSPHORUS  FOLATE  PREALBUMIN  VITAMIN B12  CALCIUM, IONIZED  TYPE AND SCREEN  TYPE AND SCREEN  TYPE AND SCREEN   ____________________________________________  EKG  ED ECG REPORT I, Hinda Kehr, the attending physician, personally viewed and interpreted this ECG.  Date: 08/11/2018 EKG Time: 1:01 AM Rate: 66 Rhythm: normal sinus rhythm with short PR interval QRS Axis:  normal Intervals: PR interval of 108, QTc 494 ms ST/T Wave abnormalities: Non-specific ST segment / T-wave changes, but no evidence of acute ischemia. Narrative Interpretation: no evidence of acute ischemia   ____________________________________________  RADIOLOGY I, Hinda Kehr, personally viewed and evaluated these images (plain radiographs) as part of my medical decision making, as well as reviewing the written report by the radiologist.  ED MD interpretation: Closed displaced right intertrochanteric hip fracture  Official radiology report(s): Dg Chest 1 View  Result Date: 08/11/2018 CLINICAL DATA:  Fall EXAM: CHEST  1 VIEW COMPARISON:  Radiograph 10/14/2017 FINDINGS: Sternotomy wires overlie normal cardiac silhouette. Mild central venous congestion similar prior. No infiltrate. No pneumothorax. IMPRESSION: Mild central venous congestion. Midline sternotomy. Electronically Signed   By: Suzy Bouchard M.D.   On: 08/11/2018 01:17   Dg Hip Unilat W Or Wo Pelvis 2-3 Views Right  Result Date: 08/11/2018 CLINICAL DATA:  Fall, hip pain EXAM: DG HIP (WITH OR WITHOUT PELVIS) 2-3V RIGHT COMPARISON:  None. FINDINGS: Intertrochanteric fracture of the proximal RIGHT femur with superior migration of the distal fracture fragment. IMPRESSION: Acute intertrochanteric RIGHT femur fracture Electronically Signed   By: Suzy Bouchard M.D.   On: 08/11/2018 01:16    ____________________________________________   PROCEDURES  Critical Care performed: No   Procedure(s) performed:   Procedures   ____________________________________________   INITIAL IMPRESSION / ASSESSMENT AND PLAN / ED COURSE  As part of my medical decision making, I reviewed the following data within the [place] History obtained from family, Nursing notes reviewed and incorporated, Labs reviewed , EKG interpreted , Old chart reviewed, Radiograph reviewed , Discussed with admitting physician , A consult was  requested  and the case was discussed with this/these consultant(s) Orthopedics and Notes from prior ED visits    Differential diagnosis includes, but is not limited to, hip/femur fracture, hip dislocation, hematoma, other traumatic injury.  Fortunately she has no other pain or evidence of injuries except for the hip.  No indication for head CT or cervical spine CT.  Lab work is pending and I am sending the patient for radiographs of both of the hip/pelvis and of the chest because I am quite certain that the hip is broken and will require surgery and medical clearance.  The patient does not need pain medicine right now because of the fentanyl 75 mcg she received prior to arrival but anticipate she will need more pain control.  I have asked her to remain n.p.o.  Family is at bedside.  Clinical Course as of Aug 11 513  Sat Aug 11, 2018  7654 Reviewed imaging personally and can easily visualize the right hip fracture through the femoral neck.  I have spoken by phone with Dr. Harlow Mares to inform him of the patient and he agrees with the plan for hospitalist admission.  I have dated the patient and her daughter who is at her bedside.  I will plan to speak with the hospitalist for admission.  The patient remains neurovascularly intact but continues to be in pain so I am giving her 4 mg of morphine IV.  DG Hip Unilat W or Wo Pelvis 2-3 Views Right [CF]  0108 Spoke with Dr. Duane Boston with the hospitalist service who will admit.   [CF]  0140 Radiology confirms right intertrochanteric hip fracture.  Some mild central chest congestion on chest x-ray, otherwise unremarkable.  CMP unremarkable for a patient with end-stage renal disease on dialysis.   [CF]    Clinical Course User Index [CF] Hinda Kehr, MD  Labs otherwise reassuring and unremarkable.  ____________________________________________  FINAL CLINICAL IMPRESSION(S) / ED DIAGNOSES  Final diagnoses:  Closed displaced fracture of right femoral neck (HCC)   Sleep apnea, unspecified type  ESRD on hemodialysis ([place])  Chronic congestive heart failure, unspecified heart failure type ([place])  Coronary artery disease without angina pectoris, unspecified vessel or lesion type, unspecified whether native or transplanted heart     MEDICATIONS GIVEN DURING THIS VISIT:  Medications  0.9 %  sodium chloride infusion ( Intravenous New Bag/Given 08/11/18 0050)  HYDROmorphone (DILAUDID) injection 0.5 mg (has no administration in time range)  senna-docusate (Senokot-S) tablet 1 tablet (has no administration in time range)  bisacodyl (DULCOLAX) EC tablet 5 mg (has no administration in time range)  ondansetron (ZOFRAN) tablet 4 mg (has no administration in time range)    Or  ondansetron (ZOFRAN) injection 4 mg (has no administration in time range)  heparin injection 5,000 Units (has no administration in time range)  acetaminophen (TYLENOL) tablet 650 mg (has no administration in time range)    Or  acetaminophen (TYLENOL) suppository 650 mg (has no administration in time range)  insulin aspart (novoLOG) injection 0-5 Units (has no administration in time range)  insulin aspart (novoLOG) injection 0-15 Units (has no administration in time range)  albuterol (PROVENTIL) (2.5 MG/3ML) 0.083% nebulizer solution 2.5 mg (has no administration in time range)  aspirin EC tablet 81 mg (has no administration in time range)  carvedilol (COREG) tablet 25 mg (has no administration in time range)  cholecalciferol (VITAMIN D3) tablet 5,000 Units (has no administration in time range)  clopidogrel (PLAVIX) tablet 75 mg (has no administration in time range)  FLUoxetine (PROZAC) capsule 20 mg (has no administration in time range)  hydrALAZINE (APRESOLINE) tablet 50 mg (50 mg Oral Not Given 08/11/18 0316)  isosorbide mononitrate (IMDUR) 24 hr tablet 30 mg (has no administration in time range)  levothyroxine (SYNTHROID, LEVOTHROID) tablet 75 mcg (has no administration in time range)    rosuvastatin (CRESTOR) tablet 40 mg (has no administration in time range)  irbesartan (AVAPRO) tablet 300 mg (has no administration in time range)  traZODone (DESYREL) tablet 50 mg (has no administration in time range)  Ferrous Fumarate (HEMOCYTE - 106 mg FE) tablet 106 mg of iron (has no administration in time range)    And  iron polysaccharides (NIFEREX) capsule 150 mg (has no administration in time range)  morphine 4 MG/ML injection 4 mg (4 mg Intravenous Given 08/11/18 0056)  ondansetron (ZOFRAN) injection 4 mg (4 mg Intravenous Given 08/11/18 0052)     ED Discharge Orders    None       Note:  This document was prepared using Dragon voice recognition software and may include unintentional dictation errors.    Hinda Kehr, MD 08/11/18 [phone]

## 2018-08-11 NOTE — ED Triage Notes (Signed)
Pt arrived via EMS after a fall while coming out of bathroom c/o rt hip pain. Pt received 73mcg fentanyl en route.

## 2018-08-11 NOTE — Care Management Note (Signed)
Case Management Note  Patient Details  Name: Dawn Bradford MRN: [id number] Date of Birth: Oct 11, 1935  Subjective/Objective:   Patient from home with husband and suffered fall in the bathroom. Patient uses a walker at baseline. Husband is able to assist with activities of daily living as needed but maintains that patient is fairly independent at baseline. Unfortunately, husband suffered a fall while retrieving the patient from the bathroom when she fell. They were able to use patients life alert to get EMS. Multiple daughters are involved and they share [place] responsibilities. Patient is on HD M,W,F. Ortho is consulted. PCP [facility].                   Action/Plan: While it is early in the disposition RNCM team will follow to assist with transition care needs.  Notified [place] liaison of admission.   Expected Discharge Date:  08/13/18               Expected Discharge Plan:     In-House Referral:     Discharge planning Services     Post Acute Care Choice:    Choice offered to:     DME Arranged:    DME Agency:     HH Arranged:    HH Agency:     Status of Service:     If discussed at [employer], dates discussed:    Additional Comments:  Latanya Maudlin, RN 08/11/2018, 8:27 AM

## 2018-08-11 NOTE — Consult Note (Signed)
ORTHOPAEDIC CONSULTATION  REQUESTING PHYSICIAN: [name], MD  Chief Complaint: right hip pain  HPI: Dawn Bradford is a 82 y.o. female who complains of right hip pain after a mechanical fall. Please see H&P and ED notes for details. Denies any numbness, tingling or constitutional symptoms.  Past Medical History:  Diagnosis Date  . ACE-inhibitor cough   . Aortic valve disorder    Echo in 2009 showed mean aortic valve gradient of 13 mmHg, suggesting very mild stenosis. Echo (12/10) suggested aortic sclerosis only  . Carotid artery disease (HCC)    mild, carotid dopplers 12/2009  . CHF (congestive heart failure) ([place])   . COPD (chronic obstructive pulmonary disease) ([place])   . Coronary artery disease    s/p anterior MI in 1996 followed by CABG. Lexiscan myoview (4/11): EF 67%, normal perfusion with no evidence for ischemia or infarction.   . Diabetes mellitus    type 2  . Diastolic heart failure    most recent echo (12/10) showed EF 55-60% with mild LVH, grade I diastolic dysfunction, mild LAE.  [name] ESRD (end stage renal disease) on dialysis ([place])   . Hyperlipidemia   . Hypertension    resistant hyptertension times many years. the patient does have renal artery stenosis. she has tried calcium channel blockers in the past and states that she would not take them now because she had some problems with her gums which her dentist identified as calcium-channel blocker side effects.  . Hypothyroidism   . Mild asthma    PFT 02/05/10 FEV1 1.38, FEV1% 73, TLC 3.78 (86%), DLCO 48%, +BD  . Myocardial infarction ([place])   . Obesity   . Obesity hypoventilation syndrome ([place])       . Obstructive sleep apnea    PSG 01/05/2006 AHI 24.8, CPAP 9cm H2O  . Pulmonary nodule, right   . Renal artery stenosis [facility])    The patient has an occluded right renal artery and an atrophic right kidney. There is 20% left renal artery stenosis. This was seen by catheterization in 2007  . Stroke [facility])    Past Surgical  History:  Procedure Laterality Date  . A/V FISTULAGRAM N/A 01/25/2018   Procedure: A/V FISTULAGRAM;  Surgeon: Algernon Huxley, MD;  Location: [place] [facility] LAB;  Service: Cardiovascular;  Laterality: N/A;  . ABDOMINAL HYSTERECTOMY  1985  . AV FISTULA PLACEMENT    . CATARACT EXTRACTION  2010  . CORONARY ARTERY BYPASS GRAFT  1996  . DIALYSIS FISTULA CREATION    . [place]   right  . PERIPHERAL VASCULAR CATHETERIZATION Left 01/06/2015   Procedure: A/V Shuntogram/Fistulagram;  Surgeon: Katha Cabal, MD;  Location: [place] [facility];  Service: Cardiovascular;  Laterality: Left;  . PERIPHERAL VASCULAR CATHETERIZATION N/A 02/17/2015   Procedure: Dialysis/Perma Catheter Removal;  Surgeon: Katha Cabal, MD;  Location: [place] [facility];  Service: Cardiovascular;  Laterality: N/A;  . PERIPHERAL VASCULAR CATHETERIZATION N/A 01/28/2016   Procedure: A/V Shuntogram/Fistulagram;  Surgeon: Algernon Huxley, MD;  Location: [place] [facility];  Service: Cardiovascular;  Laterality: N/A;  . PERIPHERAL VASCULAR CATHETERIZATION N/A 01/28/2016   Procedure: A/V Shunt Intervention;  Surgeon: Algernon Huxley, MD;  Location: [place] [facility];  Service: Cardiovascular;  Laterality: N/A;  . TUBAL LIGATION  1973   Social History   Socioeconomic History  . Marital status: Married    Spouse name: Not on file  . Number of children: Not on file  .  Years of education: Not on file  . Highest education level: Not on file  Occupational History  . Not on file  Social Needs  . Financial resource strain: Not on file  . Food insecurity:    Worry: Not on file    Inability: Not on file  . Transportation needs:    Medical: Not on file    Non-medical: Not on file  Tobacco Use  . Smoking status: Never Smoker  . Smokeless tobacco: Never Used  Substance and Sexual Activity  . Alcohol use: No  . Drug use: No  . Sexual activity: Not on file  Lifestyle  . Physical activity:    Days per week: Not  on file    Minutes per session: Not on file  . Stress: Not on file  Relationships  . Social connections:    Talks on phone: Not on file    Gets together: Not on file    Attends religious service: Not on file    Active member of club or organization: Not on file    Attends meetings of clubs or organizations: Not on file    Relationship status: Not on file  Other Topics Concern  . Not on file  Social History Narrative  . Not on file   Family History  Problem Relation Age of Onset  . Kidney failure Mother   . Diabetes Mother   . Aortic aneurysm Father   . Coronary artery disease Father   . Heart attack Father   . Diabetes Sister   . Diabetes Brother   . Kidney failure Brother    Allergies  Allergen Reactions  . Ace Inhibitors Other (See Comments)    Reaction:  Unknown   . Amlodipine Other (See Comments)    Reaction:  Unknown   . Clonidine Hydrochloride Other (See Comments)    Reaction:  Unknown   . Codeine Hives  . Latex Hives  . Metformin Nausea And Vomiting  . Tape Rash   Prior to Admission medications   Medication Sig Start Date End Date Taking? Authorizing Provider  acetaminophen (TYLENOL) 500 MG tablet Take 1,000 mg by mouth every 6 (six) hours as needed for mild pain.    [provider]  albuterol (PROVENTIL HFA;VENTOLIN HFA) 108 (90 BASE) MCG/ACT inhaler Inhale 2 puffs into the lungs every 6 (six) hours as needed for wheezing or shortness of breath.    [provider]  aspirin EC 81 MG tablet Take 81 mg by mouth daily.    [provider]  b complex-vitamin c-folic acid (NEPHRO-VITE) 0.8 MG TABS tablet Take 1 tablet by mouth daily.    [provider]  carvedilol (COREG) 25 MG tablet Take 25 mg by mouth 2 (two) times daily.     [provider]  Cholecalciferol (VITAMIN D3) 5000 UNITS TABS Take 5,000 Units by mouth daily.    [provider]  clopidogrel (PLAVIX) 75 MG tablet Take 1 tablet (75 mg total) by mouth  daily. 02/03/15   Theodoro Grist, MD  ferrous fumarate-iron polysaccharide complex (TANDEM) 162-115.2 MG CAPS capsule Take 1 capsule by mouth daily.    [provider]  FLUoxetine (PROZAC) 20 MG tablet Take 20 mg by mouth daily.  08/25/17   [provider]  furosemide (LASIX) 40 MG tablet Take 20-80 mg by mouth. Sunday, Tuesday and Saturday pt takes 80 mg in the morning. Thursdays 20 mg only. Tuesdays pt may take lower dose. 07/11/17   [provider]  gabapentin (NEURONTIN) 100 MG capsule 100 mg.  07/10/17   [provider]  hydrALAZINE (APRESOLINE) 50 MG tablet Take 50 mg by mouth 2 (two) times daily.  07/10/17   [provider]  IRON PO Take by mouth.    [provider]  isosorbide mononitrate (IMDUR) 30 MG 24 hr tablet Take 30 mg by mouth at bedtime.  07/10/17   [provider]  lactulose (CHRONULAC) 10 GM/15ML solution Place rectally.    [provider]  levothyroxine (SYNTHROID, LEVOTHROID) 75 MCG tablet Take 75 mcg by mouth daily.     [provider]  lidocaine-prilocaine (EMLA) cream Apply 1 application topically as needed (for pain).    [provider]  nystatin (MYCOSTATIN/NYSTOP) 100000 UNIT/GM POWD Apply 1 g topically as needed (for irritation). Reported on 01/28/2016    [provider]  rosuvastatin (CRESTOR) 40 MG tablet Take 40 mg by mouth every evening.  07/11/17   [provider]  telmisartan (MICARDIS) 80 MG tablet Take 80 mg by mouth daily. At night    [provider]  traZODone (DESYREL) 50 MG tablet Take 50 mg by mouth at bedtime.    [provider]  triamcinolone (KENALOG) 0.5 % cream Apply 1 application topically as needed (for irritation).     [provider]   Dg Chest 1 View  Result Date: 08/11/2018 CLINICAL DATA:  Fall EXAM: CHEST  1 VIEW COMPARISON:  Radiograph 10/14/2017 FINDINGS: Sternotomy wires overlie normal cardiac silhouette. Mild  central venous congestion similar prior. No infiltrate. No pneumothorax. IMPRESSION: Mild central venous congestion. Midline sternotomy. Electronically Signed   By: Suzy Bouchard M.D.   On: 08/11/2018 01:17   Dg Hip Unilat W Or Wo Pelvis 2-3 Views Right  Result Date: 08/11/2018 CLINICAL DATA:  Fall, hip pain EXAM: DG HIP (WITH OR WITHOUT PELVIS) 2-3V RIGHT COMPARISON:  None. FINDINGS: Intertrochanteric fracture of the proximal RIGHT femur with superior migration of the distal fracture fragment. IMPRESSION: Acute intertrochanteric RIGHT femur fracture Electronically Signed   By: Suzy Bouchard M.D.   On: 08/11/2018 01:16    Positive ROS: All other systems have been reviewed and were otherwise negative with the exception of those mentioned in the HPI and as above.  Physical Exam: General: Alert, no acute distress Cardiovascular: No pedal edema Respiratory: No cyanosis, no use of accessory musculature GI: No organomegaly, abdomen is soft and non-tender Skin: No lesions in the area of chief complaint Neurologic: Sensation intact distally Psychiatric: Patient is competent for consent with normal mood and affect Lymphatic: No axillary or cervical lymphadenopathy  MUSCULOSKELETAL: right lower extremity is short and externally rotated. She has diffuse tenderness. +DF/PF/EHL. Compartments soft. Good cap refill. Motor and sensory intact distally.  Assessment: Right hip intertrochanteric fracture  Plan: Plan a right hip trochanteric femoral nail tomorrow.  The diagnosis, risks, benefits and alternatives to treatment are all discussed in detail with the patient and family. Risks include but are not limited to bleeding, infection, deep vein thrombosis, pulmonary embolism, nerve or vascular injury, non-union, repeat operation, persistent pain, weakness, stiffness and death. She understands and is eager to proceed.     Lovell Sheehan, MD    08/11/2018 1:12 PM

## 2018-08-11 NOTE — Anesthesia Preprocedure Evaluation (Deleted)
Anesthesia Evaluation  Patient identified by MRN, date of birth, ID band Patient awake    Reviewed: Allergy & Precautions, H&P , NPO status , Patient's Chart, lab work & pertinent test results  Airway Mallampati: III       Dental  (+) Chipped   Pulmonary neg pulmonary ROS, asthma , sleep apnea, Continuous Positive Airway Pressure Ventilation and Oxygen sleep apnea , COPD,  oxygen dependent,  Followed by Pulmonology.  Wears 2L [place] as needed for SOB at home    + wheezing      Cardiovascular hypertension, (-) angina+ CAD, + Past MI (1996), + CABG (1996) and +CHF (grade 1 DD)  negative cardio ROS   Rhythm:regular Rate:Normal  Echo 02/02/15 - Left ventricle: The cavity size was mildly dilated. Systolic   function was normal. The estimated ejection fraction was in the   range of 55% to 60%. Wall motion was normal; there were no   regional wall motion abnormalities. Left ventricular diastolic   function parameters were normal. - Left atrium: The atrium was normal in size. - Right ventricle: Systolic function was normal. - Pulmonary arteries: Systolic pressure was mildly elevated. PA   peak pressure: 43 mm Hg (S).   Neuro/Psych CVA (aphasia, confusion), Residual Symptoms negative neurological ROS  negative psych ROS   GI/Hepatic negative GI ROS, Neg liver ROS,   Endo/Other  negative endocrine ROSdiabetesHypothyroidism   Renal/GU ESRF and DialysisRenal disease (last dialysis 08/10/18, no issues)     Musculoskeletal   Abdominal   Peds  Hematology negative hematology ROS (+)   Anesthesia Other Findings Patient has cardiac clearance for this procedure.   Past Medical History: No date: ACE-inhibitor cough No date: Aortic valve disorder     Comment:  Echo in 2009 showed mean aortic valve gradient of 13               mmHg, suggesting very mild stenosis. Echo (12/10)               suggested aortic sclerosis only No date:  Carotid artery disease (HCC)     Comment:  mild, carotid dopplers 12/2009 No date: CHF (congestive heart failure) (HCC) No date: COPD (chronic obstructive pulmonary disease) (HCC) No date: Coronary artery disease     Comment:  s/p anterior MI in 1996 followed by CABG. Lexiscan               myoview (4/11): EF 67%, normal perfusion with no evidence              for ischemia or infarction.  No date: Diabetes mellitus     Comment:  type 2 No date: Diastolic heart failure     Comment:  most recent echo (12/10) showed EF 55-60% with mild LVH,              grade I diastolic dysfunction, mild LAE. No date: ESRD (end stage renal disease) on dialysis ([place]) No date: Hyperlipidemia No date: Hypertension     Comment:  resistant hyptertension times many years. the patient               does have renal artery stenosis. she has tried calcium               channel blockers in the past and states that she would               not take them now because she had some problems with her  gums which her dentist identified as calcium-channel               blocker side effects. No date: Hypothyroidism No date: Mild asthma     Comment:  PFT 02/05/10 FEV1 1.38, FEV1% 73, TLC 3.78 (86%), DLCO               48%, +BD No date: Myocardial infarction ([place]) No date: Obesity No date: Obesity hypoventilation syndrome (HCC)     Comment:    No date: Obstructive sleep apnea     Comment:  PSG 01/05/2006 AHI 24.8, CPAP 9cm H2O No date: Pulmonary nodule, right No date: Renal artery stenosis (HCC)     Comment:  The patient has an occluded right renal artery and an               atrophic right kidney. There is 20% left renal artery               stenosis. This was seen by catheterization in 2007 No date: Stroke ([place])   Reproductive/Obstetrics negative OB ROS                           Anesthesia Physical Anesthesia Plan  ASA: IV  Anesthesia Plan: General ETT   Post-op Pain  Management:    Induction: Intravenous  PONV Risk Score and Plan: Dexamethasone and Ondansetron  Airway Management Planned: Oral ETT  Additional Equipment:   Intra-op Plan:   Post-operative Plan:   Informed Consent: I have reviewed the patients History and Physical, chart, labs and discussed the procedure including the risks, benefits and alternatives for the proposed anesthesia with the patient or authorized representative who has indicated his/her understanding and acceptance.   Dental Advisory Given  Plan Discussed with: Anesthesiologist, CRNA and Surgeon  Anesthesia Plan Comments: (Discussed with Dr. Harlow Mares whether case needs to urgently proceed given her high risk of cardiac events with general anesthesia (CHF, CAD, CVA, ESRD, DM - as well as COPD with current wheezing) or if case can wait until Plavix has been held for at least 5 days so spinal anesthetic can be attempted.  Dr. Harlow Mares agreed that the case was not urgent and could wait until Plavix has been held for at least 5 days.  Case will be rescheduled for next week.)      Anesthesia Quick Evaluation

## 2018-08-11 NOTE — Progress Notes (Signed)
[place] [facility] [place] NAME: Dawn Bradford    MR#:  [id number]  DATE OF BIRTH:  [birth year]  SUBJECTIVE:    REVIEW OF SYSTEMS:   ROS Tolerating Diet: Tolerating PT:   DRUG ALLERGIES:   Allergies  Allergen Reactions  . Ace Inhibitors Other (See Comments)    Reaction:  Unknown   . Amlodipine Other (See Comments)    Reaction:  Unknown   . Clonidine Hydrochloride Other (See Comments)    Reaction:  Unknown   . Codeine Hives  . Latex Hives  . Metformin Nausea And Vomiting  . Tape Rash    VITALS:  Blood pressure (!) 144/54, pulse 69, temperature 97.9 F (36.6 C), temperature source Oral, resp. rate 20, height 5\' 3"  (1.6 m), weight 103.3 kg, last menstrual period 02/01/1986, SpO2 98 %.  PHYSICAL EXAMINATION:   Physical Exam  GENERAL:  82 y.o.-year-old patient lying in the bed with no acute distress.  EYES: Pupils equal, round, reactive to light and accommodation. No scleral icterus. Extraocular muscles intact.  HEENT: Head atraumatic, normocephalic. Oropharynx and nasopharynx clear.  NECK:  Supple, no jugular venous distention. No thyroid enlargement, no tenderness.  LUNGS: Normal breath sounds bilaterally, no wheezing, rales, rhonchi. No use of accessory muscles of respiration.  CARDIOVASCULAR: S1, S2 normal. No murmurs, rubs, or gallops.  ABDOMEN: Soft, nontender, nondistended. Bowel sounds present. No organomegaly or mass.  EXTREMITIES: No cyanosis, clubbing or edema b/l.    NEUROLOGIC: Cranial nerves II through XII are intact. No focal Motor or sensory deficits b/l.   PSYCHIATRIC:  patient is alert and oriented x 3.  SKIN: No obvious rash, lesion, or ulcer.   LABORATORY PANEL:  CBC Recent Labs  Lab 08/11/18 0057  WBC 10.2  HGB 9.8*  HCT 29.3*  PLT 90*    Chemistries  Recent Labs  Lab 08/11/18 0057  NA 133*  K 3.6  CL 99  CO2 26  GLUCOSE 147*  BUN 16  CREATININE 2.20*  CALCIUM 8.1*  MG 1.8  AST 25  ALT  14  ALKPHOS 50  BILITOT 0.8   Cardiac Enzymes No results for input(s): TROPONINI in the last 168 hours. RADIOLOGY:  Dg Chest 1 View  Result Date: 08/11/2018 CLINICAL DATA:  Fall EXAM: CHEST  1 VIEW COMPARISON:  Radiograph 10/14/2017 FINDINGS: Sternotomy wires overlie normal cardiac silhouette. Mild central venous congestion similar prior. No infiltrate. No pneumothorax. IMPRESSION: Mild central venous congestion. Midline sternotomy. Electronically Signed   By: Suzy Bouchard M.D.   On: 08/11/2018 01:17   Dg Hip Unilat W Or Wo Pelvis 2-3 Views Right  Result Date: 08/11/2018 CLINICAL DATA:  Fall, hip pain EXAM: DG HIP (WITH OR WITHOUT PELVIS) 2-3V RIGHT COMPARISON:  None. FINDINGS: Intertrochanteric fracture of the proximal RIGHT femur with superior migration of the distal fracture fragment. IMPRESSION: Acute intertrochanteric RIGHT femur fracture Electronically Signed   By: Suzy Bouchard M.D.   On: 08/11/2018 01:16   ASSESSMENT AND PLAN:  Dawn Bradford is a 82 y.o. female with medical history as listed below which notably includes end-stage renal disease on hemodialysis, history of CAD with a prior MI on Plavix and aspirin, sleep apnea, and at least some degree of chronic CHF.  She presents by EMS for evaluation of right hip pain that was acute in onset and severe. She fell coming out of the [place].  1. Acute right hip fracture s/p mechanical fall at home -Appreciate Dr Harlow Mares input--for  surgery tomorrow -prn pain meds -d/c IVF -npo after midnight  2.h/o CAD and Chronic diastolic CHF -seen by Dr End-- high perioperative risk. No further work up recommended. Pt best at baseline -cont cardiac meds  3/ESRD on HD -seen by Dr Holley Raring  4. Anemia of CKD -monitor hgb  5. HTN Continue carvedilol, hydralazine, irbesartan for blood pressure control.  D/w family  Case discussed with Care Management/Social Worker. Management plans discussed with the patient, family and they are in  agreement.  CODE STATUS: full  DVT Prophylaxis: SCD--after surgery  TOTAL TIME TAKING CARE OF THIS PATIENT: **30* minutes.  >50% time spent on counselling and coordination of care  POSSIBLE D/C IN 2-3 DAYS, DEPENDING ON CLINICAL CONDITION.  Note: This dictation was prepared with Dragon dictation along with smaller phrase technology. Any transcriptional errors that result from this process are unintentional.  [name] M.D on 08/11/2018 at 3:25 PM  Between 7am to 6pm - Pager - [phone]  After 6pm go to [email or web address] - Proofreader  [facility]  Office  [phone]  CC: Primary care physician; Hortencia Pilar, MDPatient ID: Dawn Bradford, female   DOB: [birth year], 82 y.o.   MRN: [id number]

## 2018-08-11 NOTE — Consult Note (Signed)
Cardiology Consultation:   Patient ID: Dawn Bradford MRN: [id number]; DOB: 1936/06/03  Admit date: 08/10/2018 Date of Consult: 08/11/2018  Primary Care Provider: Hortencia Pilar, MD Primary Cardiologist: Kathlyn Sacramento, MD Primary Electrophysiologist:  None    Patient Profile:   Dawn Bradford is a 82 y.o. female with a hx of CAD s/p CABG (1996), HFpEF, CVA with residual word finding difficulty, COPD, renal artery stenosis, hypertension, ESRD, morbid obesity, and OHS/OSA, who is being seen today for the evaluation of preoperative cardiovascular risk assessment in the setting of right hip fracture at the request of Dr. Aliene Altes.  History of Present Illness:   Dawn Bradford was in her usual state of health until yesterday morning when she slipped in the bathroom.  She was applying lotion to her body and felt her legs spread apart.  Upon hitting the ground, she had severe pain in her right hip.  She was ultimately brought to the emergency department and found to have a right intertrochanteric femoral neck fracture.  She denies passing out.  She has baseline exertional dyspnea that has been present for years and is unchanged.  Typically, this improves after her hemodialysis, which she has remained compliant with.  She denies chest pain, palpitations, and orthopnea.  She is compliant with CPAP at night.  She intermittently wears oxygen during the day, which has been managed by her pulmonologist.  She notes occasional left lower extremity edema.  Her mobility is limited, typically ambulating with a walker.  Leading up to her CABG in 1996, Dawn Bradford had been having "indigestion" unresponsive to GERD therapy.  She was ultimately found to have coronary disease and underwent bypass.  She has not had any symptoms reminiscent of that.  Most recent ischemia evaluation in 2016 showed a small apical defect consistent with breast attenuation.  This study was low risk without significant ischemia or scar.  LVEF was also  preserved by stress test and echo.  Past Medical History:  Diagnosis Date  . ACE-inhibitor cough   . Aortic valve disorder    Echo in 2009 showed mean aortic valve gradient of 13 mmHg, suggesting very mild stenosis. Echo (12/10) suggested aortic sclerosis only  . Carotid artery disease (HCC)    mild, carotid dopplers 12/2009  . CHF (congestive heart failure) ([place])   . COPD (chronic obstructive pulmonary disease) ([place])   . Coronary artery disease    s/p anterior MI in 1996 followed by CABG. Lexiscan myoview (4/11): EF 67%, normal perfusion with no evidence for ischemia or infarction.   . Diabetes mellitus    type 2  . Diastolic heart failure    most recent echo (12/10) showed EF 55-60% with mild LVH, grade I diastolic dysfunction, mild LAE.  [name] ESRD ([name] stage renal disease) on dialysis ([place])   . Hyperlipidemia   . Hypertension    resistant hyptertension times many years. the patient does have renal artery stenosis. she has tried calcium channel blockers in the past and states that she would not take them now because she had some problems with her gums which her dentist identified as calcium-channel blocker side effects.  . Hypothyroidism   . Mild asthma    PFT 02/05/10 FEV1 1.38, FEV1% 73, TLC 3.78 (86%), DLCO 48%, +BD  . Myocardial infarction ([place])   . Obesity   . Obesity hypoventilation syndrome ([place])       . Obstructive sleep apnea    PSG 01/05/2006 AHI 24.8, CPAP 9cm H2O  . Pulmonary nodule,  right   . Renal artery stenosis [facility])    The patient has an occluded right renal artery and an atrophic right kidney. There is 20% left renal artery stenosis. This was seen by catheterization in 2007  . Stroke [facility])     Past Surgical History:  Procedure Laterality Date  . A/V FISTULAGRAM N/A 01/25/2018   Procedure: A/V FISTULAGRAM;  Surgeon: Algernon Huxley, MD;  Location: [place] [facility];  Service: Cardiovascular;  Laterality: N/A;  . ABDOMINAL HYSTERECTOMY  1985  . AV FISTULA PLACEMENT     . CATARACT EXTRACTION  2010  . CORONARY ARTERY BYPASS GRAFT  1996  . DIALYSIS FISTULA CREATION    . [place]   right  . PERIPHERAL VASCULAR CATHETERIZATION Left 01/06/2015   Procedure: A/V Shuntogram/Fistulagram;  Surgeon: Katha Cabal, MD;  Location: [place] [facility];  Service: Cardiovascular;  Laterality: Left;  . PERIPHERAL VASCULAR CATHETERIZATION N/A 02/17/2015   Procedure: Dialysis/Perma Catheter Removal;  Surgeon: Katha Cabal, MD;  Location: [place] [facility];  Service: Cardiovascular;  Laterality: N/A;  . PERIPHERAL VASCULAR CATHETERIZATION N/A 01/28/2016   Procedure: A/V Shuntogram/Fistulagram;  Surgeon: Algernon Huxley, MD;  Location: [place] [facility];  Service: Cardiovascular;  Laterality: N/A;  . PERIPHERAL VASCULAR CATHETERIZATION N/A 01/28/2016   Procedure: A/V Shunt Intervention;  Surgeon: Algernon Huxley, MD;  Location: [place] [facility];  Service: Cardiovascular;  Laterality: N/A;  . TUBAL LIGATION  1973     Home Medications:  Prior to Admission medications   Medication Sig Start Date Cornie Herrington Date Taking? Authorizing Provider  acetaminophen (TYLENOL) 500 MG tablet Take 1,000 mg by mouth every 6 (six) hours as needed for mild pain.    [provider]  albuterol (PROVENTIL HFA;VENTOLIN HFA) 108 (90 BASE) MCG/ACT inhaler Inhale 2 puffs into the lungs every 6 (six) hours as needed for wheezing or shortness of breath.    [provider]  aspirin EC 81 MG tablet Take 81 mg by mouth daily.    [provider]  b complex-vitamin c-folic acid (NEPHRO-VITE) 0.8 MG TABS tablet Take 1 tablet by mouth daily.    [provider]  carvedilol (COREG) 25 MG tablet Take 25 mg by mouth 2 (two) times daily.     [provider]  Cholecalciferol (VITAMIN D3) 5000 UNITS TABS Take 5,000 Units by mouth daily.    [provider]  clopidogrel (PLAVIX) 75 MG tablet Take 1 tablet (75 mg total) by mouth daily. 02/03/15    Theodoro Grist, MD  ferrous fumarate-iron polysaccharide complex (TANDEM) 162-115.2 MG CAPS capsule Take 1 capsule by mouth daily.    [provider]  FLUoxetine (PROZAC) 20 MG tablet Take 20 mg by mouth daily.  08/25/17   [provider]  furosemide (LASIX) 40 MG tablet Take 20-80 mg by mouth. Sunday, Tuesday and Saturday pt takes 80 mg in the morning. Thursdays 20 mg only. Tuesdays pt may take lower dose. 07/11/17   [provider]  gabapentin (NEURONTIN) 100 MG capsule 100 mg.  07/10/17   [provider]  hydrALAZINE (APRESOLINE) 50 MG tablet Take 50 mg by mouth 2 (two) times daily.  07/10/17   [provider]  IRON PO Take by mouth.    [provider]  isosorbide mononitrate (IMDUR) 30 MG 24 hr tablet Take 30 mg by mouth at bedtime.  07/10/17   [provider]  lactulose (CHRONULAC) 10 GM/15ML solution Place rectally.  [provider]  levothyroxine (SYNTHROID, LEVOTHROID) 75 MCG tablet Take 75 mcg by mouth daily.     [provider]  lidocaine-prilocaine (EMLA) cream Apply 1 application topically as needed (for pain).    [provider]  nystatin (MYCOSTATIN/NYSTOP) 100000 UNIT/GM POWD Apply 1 g topically as needed (for irritation). Reported on 01/28/2016    [provider]  rosuvastatin (CRESTOR) 40 MG tablet Take 40 mg by mouth every evening.  07/11/17   [provider]  telmisartan (MICARDIS) 80 MG tablet Take 80 mg by mouth daily. At night    [provider]  traZODone (DESYREL) 50 MG tablet Take 50 mg by mouth at bedtime.    [provider]  triamcinolone (KENALOG) 0.5 % cream Apply 1 application topically as needed (for irritation).     [provider]    Inpatient Medications: Scheduled Meds: . aspirin EC  81 mg Oral Daily  . [START ON 08/13/2018] carvedilol  25 mg Oral Q M,W,F-1800  . cholecalciferol  5,000 Units Oral Daily  . clopidogrel  75 mg Oral  Daily  . Ferrous Fumarate  1 tablet Oral Daily   And  . iron polysaccharides  150 mg Oral Daily  . FLUoxetine  20 mg Oral Daily  . heparin  5,000 Units Subcutaneous Q8H  . hydrALAZINE  50 mg Oral BID  . insulin aspart  0-15 Units Subcutaneous TID WC  . insulin aspart  0-5 Units Subcutaneous QHS  . irbesartan  300 mg Oral Daily  . isosorbide mononitrate  30 mg Oral QHS  . levothyroxine  75 mcg Oral QAC breakfast  . rosuvastatin  40 mg Oral QPM  . traZODone  50 mg Oral QHS   Continuous Infusions: . sodium chloride 100 mL/hr at 08/11/18 0050   PRN Meds: acetaminophen **OR** acetaminophen, albuterol, bisacodyl, HYDROmorphone (DILAUDID) injection, ondansetron **OR** ondansetron (ZOFRAN) IV, senna-docusate  Allergies:    Allergies  Allergen Reactions  . Ace Inhibitors Other (See Comments)    Reaction:  Unknown   . Amlodipine Other (See Comments)    Reaction:  Unknown   . Clonidine Hydrochloride Other (See Comments)    Reaction:  Unknown   . Codeine Hives  . Latex Hives  . Metformin Nausea And Vomiting  . Tape Rash    Social History:   Social History   Socioeconomic History  . Marital status: Married    Spouse name: Not on file  . Number of children: Not on file  . Years of education: Not on file  . Highest education level: Not on file  Occupational History  . Not on file  Social Needs  . Financial resource strain: Not on file  . Food insecurity:    Worry: Not on file    Inability: Not on file  . Transportation needs:    Medical: Not on file    Non-medical: Not on file  Tobacco Use  . Smoking status: Never Smoker  . Smokeless tobacco: Never Used  Substance and Sexual Activity  . Alcohol use: No  . Drug use: No  . Sexual activity: Not on file  Lifestyle  . Physical activity:    Days per week: Not on file    Minutes per session: Not on file  . Stress: Not on file  Relationships  . Social connections:    Talks on phone: Not on file    Gets together: Not on  file    Attends religious service: Not on file  Active member of club or organization: Not on file    Attends meetings of clubs or organizations: Not on file    Relationship status: Not on file  . Intimate partner violence:    Fear of current or ex partner: Not on file    Emotionally abused: Not on file    Physically abused: Not on file    Forced sexual activity: Not on file  Other Topics Concern  . Not on file  Social History Narrative  . Not on file    Family History:   Family History  Problem Relation Age of Onset  . Kidney failure Mother   . Diabetes Mother   . Aortic aneurysm Father   . Coronary artery disease Father   . Heart attack Father   . Diabetes Sister   . Diabetes Brother   . Kidney failure Brother      ROS:  Please see the history of present illness. All other ROS reviewed and negative.     Physical Exam/Data:   Vitals:   08/11/18 0009 08/11/18 0130 08/11/18 0226 08/11/18 0726  BP:  134/60 (!) 150/70 (!) 144/54  Pulse:  66 66 69  Resp:  15 20   Temp:   97.6 F (36.4 C) 97.9 F (36.6 C)  TempSrc:   Oral Oral  SpO2:  96%  98%  Weight: 103.3 kg     Height: 5\' 3"  (1.6 m)       Intake/Output Summary (Last 24 hours) at 08/11/2018 0834 Last data filed at 08/11/2018 0600 Gross per 24 hour  Intake 428.23 ml  Output -  Net 428.23 ml   Filed Weights   08/11/18 0009  Weight: 103.3 kg   Body mass index is 40.35 kg/m.  General: Morbidly obese woman lying in bed.  Her daughters are at the bedside. HEENT: normal Lymph: no adenopathy Neck: no JVD Endocrine:  No thryomegaly Vascular: No carotid bruits; 2+ right radial pulse.  Left forearm AV fistula with thrill. Cardiac: Regular rate and rhythm with occasional extrasystoles.  1/6 systolic murmur noted.  No rubs or gallops. Lungs: Unable to auscultate posteriorly due to restricted mobility from hip fracture.  Lungs are clear anteriorly. Abd: soft, nontender, no hepatomegaly  Ext: Diffuse bilateral calf  tenderness without edema. Musculoskeletal: Right lower extremity is externally rotated with limited mobility secondary to pain. Skin: warm and dry  Neuro:  CNs 2-12 intact, no focal abnormalities noted Psych:  Normal affect   EKG: None available. Telemetry:  Telemetry was personally reviewed and demonstrates: Normal sinus rhythm with frequent PACs.  Relevant CV Studies: Echocardiogram (02/01/2015): - Left ventricle: The cavity size was mildly dilated. Systolic   function was normal. The estimated ejection fraction was in the   range of 55% to 60%. Wall motion was normal; there were no   regional wall motion abnormalities. Left ventricular diastolic   function parameters were normal. - Left atrium: The atrium was normal in size. - Right ventricle: Systolic function was normal. - Pulmonary arteries: Systolic pressure was mildly elevated. PA   peak pressure: 43 mm Hg (S).  Pharmacologic myocardial perfusion stress test (08/20/2015): Small in size, mild in severity, fixed apical defect consistent with breast attenuation.  No significant ischemia or scar.  LVEF 55 to 65%.  Low risk study.  Laboratory Data:  Chemistry Recent Labs  Lab 08/11/18 0057  NA 133*  K 3.6  CL 99  CO2 26  GLUCOSE 147*  BUN 16  CREATININE 2.20*  CALCIUM  8.1*  GFRNONAA 20*  GFRAA 24*  ANIONGAP 8    Recent Labs  Lab 08/11/18 0057  PROT 5.6*  ALBUMIN 3.3*  AST 25  ALT 14  ALKPHOS 50  BILITOT 0.8   Hematology Recent Labs  Lab 08/11/18 0057  WBC 10.2  RBC 2.73*  HGB 9.8*  HCT 29.3*  MCV 107.3*  MCH 35.9*  MCHC 33.4  RDW 13.2  PLT 90*   Cardiac EnzymesNo results for input(s): TROPONINI in the last 168 hours. No results for input(s): TROPIPOC in the last 168 hours.  BNPNo results for input(s): BNP, PROBNP in the last 168 hours.  DDimer No results for input(s): DDIMER in the last 168 hours.  Radiology/Studies:  Dg Chest 1 View  Result Date: 08/11/2018 CLINICAL DATA:  Fall EXAM: CHEST   1 VIEW COMPARISON:  Radiograph 10/14/2017 FINDINGS: Sternotomy wires overlie normal cardiac silhouette. Mild central venous congestion similar prior. No infiltrate. No pneumothorax. IMPRESSION: Mild central venous congestion. Midline sternotomy. Electronically Signed   By: Suzy Bouchard M.D.   On: 08/11/2018 01:17   Dg Hip Unilat W Or Wo Pelvis 2-3 Views Right  Result Date: 08/11/2018 CLINICAL DATA:  Fall, hip pain EXAM: DG HIP (WITH OR WITHOUT PELVIS) 2-3V RIGHT COMPARISON:  None. FINDINGS: Intertrochanteric fracture of the proximal RIGHT femur with superior migration of the distal fracture fragment. IMPRESSION: Acute intertrochanteric RIGHT femur fracture Electronically Signed   By: Suzy Bouchard M.D.   On: 08/11/2018 01:16    Assessment and Plan:   Preoperative cardiovascular risk assessment with right intertrochanteric femoral neck fracture The patient has chronic exertional dyspnea that is likely multifactorial, including HFpEF and underlying lung disease.  She appears euvolemic with NYHA class III symptoms.  She does not endorse any unstable cardiac symptoms. However, given her multiple comorbidities, she is high risk for perioperative complications, including a 4.9% risk for significant cardiovascular complications.  I recommend obtaining and EKG.  If no acute changes are seen, I do not believe that additional cardiac testing will mitigate her perioperative cardiac risk.  She should proceed with operative management of her right intertrochanteric femoral neck fracture if felt appropriate by orthopedics.  I think it is reasonable to discontinue clopidogrel.  Risks and benefits of restarting dual antiplatelet therapy can be readdressed with Dr. Fletcher Anon at the time of follow-up.  Coronary artery disease No symptoms.  Continue secondary prevention.  I think it is reasonable to hold clopidogrel in the setting of fall and hip fracture.  Aspirin, carvedilol, isosorbide mononitrate, and rosuvastatin  should be continued for antianginal therapy and secondary prevention.  HFpEF Dawn Bradford appears euvolemic and reports stable NYHA class III symptoms (confounded by other comorbidities).  LVEF was preserved on last echo in 2016.  No further testing is recommended at this time.  Volume management deferred to nephrology, given ESRD.  Continue BP control with home medications.  Hypertension BP normal to elevated.  Continue home medications.  ESRD Per nephrology.  [facility] will sign off.   Medication Recommendations:  Hold clopidogrel. Other recommendations (labs, testing, etc):  Obtain EKG. Follow up as an outpatient:  Dr. Fletcher Anon after hospital discharge.  For questions or updates, please contact [place] Please consult [email or web address] for contact info under [facility].  Signed, Nelva Bush, MD  08/11/2018 8:34 AM

## 2018-08-12 LAB — BASIC METABOLIC PANEL
Anion gap: 7 (ref 5–15)
BUN: 27 mg/dL — ABNORMAL HIGH (ref 8–23)
CO2: 29 mmol/L (ref 22–32)
Calcium: 8.5 mg/dL — ABNORMAL LOW (ref 8.9–10.3)
Chloride: 98 mmol/L (ref 98–111)
Creatinine, Ser: 3.83 mg/dL — ABNORMAL HIGH (ref 0.44–1.00)
GFR calc non Af Amer: 10 mL/min — ABNORMAL LOW (ref >60)
GFR, EST AFRICAN AMERICAN: 12 mL/min — AB (ref >60)
Glucose, Bld: 131 mg/dL — ABNORMAL HIGH (ref 70–99)
Potassium: 4.1 mmol/L (ref 3.5–5.1)
Sodium: 134 mmol/L — ABNORMAL LOW (ref 135–145)

## 2018-08-12 LAB — CBC
HCT: 26.6 % — ABNORMAL LOW (ref 36.0–46.0)
Hemoglobin: 8.7 g/dL — ABNORMAL LOW (ref 12.0–15.0)
MCH: 35.7 pg — ABNORMAL HIGH (ref 26.0–34.0)
MCHC: 32.7 g/dL (ref 30.0–36.0)
MCV: 109 fL — ABNORMAL HIGH (ref 80.0–100.0)
PLATELETS: 99 10*3/uL — AB (ref 150–400)
RBC: 2.44 MIL/uL — ABNORMAL LOW (ref 3.87–5.11)
RDW: 13.2 % (ref 11.5–15.5)
WBC: 8.9 10*3/uL (ref 4.0–10.5)
nRBC: 0 % (ref 0.0–0.2)

## 2018-08-12 LAB — GLUCOSE, CAPILLARY
Glucose-Capillary: 103 mg/dL — ABNORMAL HIGH (ref 70–99)
Glucose-Capillary: 105 mg/dL — ABNORMAL HIGH (ref 70–99)
Glucose-Capillary: 172 mg/dL — ABNORMAL HIGH (ref 70–99)
Glucose-Capillary: 113 mg/dL — ABNORMAL HIGH (ref 70–99)

## 2018-08-12 MED ORDER — IRBESARTAN 150 MG PO TABS
300.0000 mg | ORAL_TABLET | Freq: Every day | ORAL | Status: DC
  Administered 2018-08-12 – 2018-08-21 (×6): 300 mg via ORAL
  Filled 2018-08-12 (×8): qty 2

## 2018-08-12 MED ORDER — BACITRACIN 50000 UNITS IM SOLR
INTRAMUSCULAR | Status: AC
  Filled 2018-08-12: qty 1

## 2018-08-12 MED ORDER — ALBUTEROL SULFATE (2.5 MG/3ML) 0.083% IN NEBU
INHALATION_SOLUTION | RESPIRATORY_TRACT | Status: AC
  Filled 2018-08-12: qty 3

## 2018-08-12 MED ORDER — EPOETIN ALFA 10000 UNIT/ML IJ SOLN
10000.0000 [IU] | INTRAMUSCULAR | Status: DC
  Administered 2018-08-13 – 2018-08-20 (×4): 10000 [IU] via INTRAVENOUS
  Filled 2018-08-12 (×4): qty 1

## 2018-08-12 MED ORDER — BUPIVACAINE-EPINEPHRINE (PF) 0.25% -1:200000 IJ SOLN
INTRAMUSCULAR | Status: AC
  Filled 2018-08-12: qty 30

## 2018-08-12 MED ORDER — HEPARIN SODIUM (PORCINE) 5000 UNIT/ML IJ SOLN
5000.0000 [IU] | Freq: Three times a day (TID) | INTRAMUSCULAR | Status: AC
  Administered 2018-08-12 – 2018-08-14 (×6): 5000 [IU] via SUBCUTANEOUS
  Filled 2018-08-12 (×6): qty 1

## 2018-08-12 MED ORDER — TRANEXAMIC ACID 1000 MG/10ML IV SOLN
INTRAVENOUS | Status: AC
  Filled 2018-08-12: qty 20

## 2018-08-12 NOTE — Progress Notes (Signed)
[facility]  ROUNDING NOTE   Subjective:  Surgery delayed at the moment as her last dosage of Plavix was on Friday at 8 AM. It appears that she will undergo hip repair on Wednesday. Patient due for dialysis tomorrow. Still in some pain.  Objective:  Vital signs in last 24 hours:  Temp:  [97.9 F (36.6 C)-98.2 F (36.8 C)] 98.2 F (36.8 C) (12/08 0736) Pulse Rate:  [66-79] 68 (12/08 1300) Resp:  [16] 16 (12/08 0016) BP: (106-137)/(43-59) 137/59 (12/08 1300) SpO2:  [93 %-100 %] 93 % (12/08 1300)  Weight change:  Filed Weights   08/11/18 0009  Weight: 103.3 kg    Intake/Output: I/O last 3 completed shifts: In: 1293.7 [P.O.:120; I.V.:1173.7] Out: -    Intake/Output this shift:  No intake/output data recorded.  Physical Exam: General: No acute distress  Head: Normocephalic, atraumatic. Moist oral mucosal membranes  Eyes: Anicteric  Neck: Supple, trachea midline  Lungs:  Clear to auscultation, normal effort  Heart: S1S2 no rubs  Abdomen:  Soft, nontender, bowel sounds present  Extremities: Trace peripheral edema.  Neurologic: Awake, alert, following commands  Skin: No lesions  Access: LUE AVF    Basic Metabolic Panel: Recent Labs  Lab 08/11/18 0057 08/12/18 0357  NA 133* 134*  K 3.6 4.1  CL 99 98  CO2 26 29  GLUCOSE 147* 131*  BUN 16 27*  CREATININE 2.20* 3.83*  CALCIUM 8.1* 8.5*  MG 1.8  --   PHOS 2.7  --     Liver Function Tests: Recent Labs  Lab 08/11/18 0057  AST 25  ALT 14  ALKPHOS 50  BILITOT 0.8  PROT 5.6*  ALBUMIN 3.3*   No results for input(s): LIPASE, AMYLASE in the last 168 hours. No results for input(s): AMMONIA in the last 168 hours.  CBC: Recent Labs  Lab 08/11/18 0057 08/12/18 0357  WBC 10.2 8.9  NEUTROABS 8.0*  --   HGB 9.8* 8.7*  HCT 29.3* 26.6*  MCV 107.3* 109.0*  PLT 90* 99*    Cardiac Enzymes: No results for input(s): CKTOTAL, CKMB, CKMBINDEX, TROPONINI in the last 168 hours.  BNP: Invalid  input(s): POCBNP  CBG: Recent Labs  Lab 08/11/18 1121 08/11/18 1632 08/11/18 2042 08/12/18 0736 08/12/18 1259  GLUCAP 117* 106* 115* 103* 105*    Microbiology: Results for orders placed or performed during the hospital encounter of 10/11/17  MRSA PCR Screening     Status: None   Collection Time: 10/14/17  5:30 PM  Result Value Ref Range Status   MRSA by PCR NEGATIVE NEGATIVE Final    Comment:        The GeneXpert MRSA Assay (FDA approved for NASAL specimens only), is one component of a comprehensive MRSA colonization surveillance program. It is not intended to diagnose MRSA infection nor to guide or monitor treatment for MRSA infections. Performed at [facility], [address] [place], [place] [address]     Coagulation Studies: Recent Labs    08/11/18 0057  LABPROT 13.9  INR 1.08    Urinalysis: No results for input(s): COLORURINE, LABSPEC, PHURINE, GLUCOSEU, HGBUR, BILIRUBINUR, KETONESUR, PROTEINUR, UROBILINOGEN, NITRITE, LEUKOCYTESUR in the last 72 hours.  Invalid input(s): APPERANCEUR    Imaging: Dg Chest 1 View  Result Date: 08/11/2018 CLINICAL DATA:  Fall EXAM: CHEST  1 VIEW COMPARISON:  Radiograph 10/14/2017 FINDINGS: Sternotomy wires overlie normal cardiac silhouette. Mild central venous congestion similar prior. No infiltrate. No pneumothorax. IMPRESSION: Mild central venous congestion. Midline sternotomy. Electronically  Signed   By: Suzy Bouchard M.D.   On: 08/11/2018 01:17   Dg Hip Unilat W Or Wo Pelvis 2-3 Views Right  Result Date: 08/11/2018 CLINICAL DATA:  Fall, hip pain EXAM: DG HIP (WITH OR WITHOUT PELVIS) 2-3V RIGHT COMPARISON:  None. FINDINGS: Intertrochanteric fracture of the proximal RIGHT femur with superior migration of the distal fracture fragment. IMPRESSION: Acute intertrochanteric RIGHT femur fracture Electronically Signed   By: Suzy Bouchard M.D.   On: 08/11/2018 01:16     Medications:   .  ceFAZolin (ANCEF) IV      . albuterol      . aspirin EC  81 mg Oral Daily  . [START ON 08/13/2018] carvedilol  25 mg Oral Q M,W,F-1800  . cholecalciferol  5,000 Units Oral Daily  . Ferrous Fumarate  1 tablet Oral Daily   And  . iron polysaccharides  150 mg Oral Daily  . FLUoxetine  20 mg Oral Daily  . heparin injection (subcutaneous)  5,000 Units Subcutaneous Q8H  . hydrALAZINE  50 mg Oral BID  . insulin aspart  0-15 Units Subcutaneous TID WC  . insulin aspart  0-5 Units Subcutaneous QHS  . irbesartan  300 mg Oral Daily  . isosorbide mononitrate  30 mg Oral QHS  . levothyroxine  75 mcg Oral QAC breakfast  . rosuvastatin  40 mg Oral QPM  . tranexamic acid (CYKLOKAPRON) topical -INTRAOP  2,000 mg Topical Once  . traZODone  50 mg Oral QHS   acetaminophen **OR** acetaminophen, albuterol, bisacodyl, HYDROmorphone (DILAUDID) injection, ondansetron **OR** ondansetron (ZOFRAN) IV, senna-docusate  Assessment/ Plan:  82 y.o. female with end-stage renal disease, coronary disease, carotid stenosis, history of stroke, diabetes, CABG, obstructive sleep apnea, history of renal artery stenosis, admitfed for right hip fracture 08/10/18.  [employer] [facility]/MWF/[facility]/99.5  1.  ESRD on HD MWF.  Patient due for dialysis tomorrow, orders to be prepared.  2.  Anemia of chronic kidney disease.  We will start the patient on Epogen with the next dialysis treatment.  3.  Secondary hyperparathyroidism.  Check intact PTH and phosphorus with next dialysis treatment.  4.  Hypertension.  Blood pressure currently 137/59.  Maintain the patient on carvedilol, hydralazine, irbesartan.  LOS: 1 Zyona [place]way 12/8/20191:40 PM

## 2018-08-12 NOTE — NC FL2 (Signed)
[place] LEVEL OF CARE SCREENING TOOL     IDENTIFICATION  Patient Name: Dawn Bradford Birthdate: 10-26-1935 Sex: female Admission Date (Current Location): 08/10/2018  [place] and [place] Number:  Engineering geologist and Address:  [facility], [address] [place], [place] [address]      Provider Number: [id number]  Attending Physician Name and Address:  [name], MD  Relative Name and Phone Number:  [name] (Daughter) [phone]; Waylynn Benefiel (Daughter) [phone]    Current Level of Care: Hospital Recommended Level of Care: [place] Prior Approval Number:    Date Approved/Denied:   PASRR Number: [id number]  Discharge Plan: SNF    Current Diagnoses: Patient Active Problem List   Diagnosis Date Noted  . Closed intertrochanteric fracture of hip, right, initial encounter ([place]) 08/11/2018  . Hypoxia 10/12/2017  . ESRD on dialysis ([place]) 07/07/2016  . Chest pain 08/19/2015  . Coronary artery disease involving native heart with angina pectoris ([place]) 08/18/2015  . Expressive aphasia 02/03/2015  . Carotid stenosis 02/03/2015  . CVA (cerebral infarction) 01/31/2015  . Diabetes mellitus type 2 in obese ([place]) 01/31/2015  . End stage renal disease ([place]) 01/31/2015  . Mild intermittent asthma 03/29/2010  . CAD, ARTERY BYPASS GRAFT 03/02/2010  . Obesity hypoventilation syndrome ([place]) 02/05/2010  . Shortness of breath 12/24/2009  . CAROTID BRUIT 12/14/2009  . DIASTOLIC HEART FAILURE, CHRONIC 02/17/2009  . DIABETES MELLITUS, TYPE II 01/29/2009  . Hyperlipidemia 01/29/2009  . Obstructive sleep apnea 01/29/2009  . Essential hypertension 01/29/2009  . Aortic valve disorder 01/29/2009  . RENAL ARTERY STENOSIS 01/29/2009  . RENAL DISEASE, CHRONIC 01/29/2009    Orientation RESPIRATION BLADDER Height & Weight     Self, Time, Situation, Place  O2(2L) Continent Weight: 227 lb 12.8 oz (103.3 kg) Height:  5'  3" (160 cm)  BEHAVIORAL SYMPTOMS/MOOD NEUROLOGICAL BOWEL NUTRITION STATUS      Continent Diet(Renal with fluid restriction (1237mL))  AMBULATORY STATUS COMMUNICATION OF NEEDS Skin   Extensive Assist Verbally Normal                       Personal Care Assistance Level of Assistance  Bathing, Feeding, Dressing Bathing Assistance: Limited assistance Feeding assistance: Independent Dressing Assistance: Limited assistance     Functional Limitations Info  Sight, Hearing, Speech Sight Info: Adequate Hearing Info: Adequate Speech Info: Adequate    SPECIAL CARE FACTORS FREQUENCY  PT (By licensed PT)     PT Frequency: Up to 5X per week              Contractures Contractures Info: Not present    Additional Factors Info  Code Status, Allergies Code Status Info: Full Allergies Info: Ace Inhibitors, Amlodipine, Clonidine Hydrochloride, Codeine, Latex, Metformin, Tape           Current Medications (08/12/2018):  This is the current hospital active medication list Current Facility-Administered Medications  Medication Dose Route Frequency Provider Last Rate Last Dose  . acetaminophen (TYLENOL) tablet 650 mg  650 mg Oral Q6H PRN Arta Silence, MD   650 mg at 08/12/18 1426   Or  . acetaminophen (TYLENOL) suppository 650 mg  650 mg Rectal Q6H PRN Arta Silence, MD      . albuterol (PROVENTIL) (2.5 MG/3ML) 0.083% nebulizer solution 2.5 mg  2.5 mg Nebulization Q6H PRN Sridharan, Prasanna, MD      . albuterol (PROVENTIL) (2.5 MG/3ML) 0.083% nebulizer solution           .  aspirin EC tablet 81 mg  81 mg Oral Daily Arta Silence, MD      . bisacodyl (DULCOLAX) EC tablet 5 mg  5 mg Oral Daily PRN Arta Silence, MD      . Derrill Memo ON 08/13/2018] carvedilol (COREG) tablet 25 mg  25 mg Oral Q M,W,F-1800 Sridharan, Prasanna, MD      . ceFAZolin (ANCEF) IVPB 2g/100 mL premix  2 g Intravenous 30 min Pre-Op Lovell Sheehan, MD      . cholecalciferol (VITAMIN D3) tablet  5,000 Units  5,000 Units Oral Daily Arta Silence, MD      . Derrill Memo ON 08/13/2018] epoetin alfa (EPOGEN,PROCRIT) injection 10,000 Units  10,000 Units Intravenous Q M,W,F-HD Lateef, Munsoor, MD      . Ferrous Fumarate (HEMOCYTE - 106 mg FE) tablet 106 mg of iron  1 tablet Oral Daily Arta Silence, MD   106 mg of iron at 08/11/18 1737   And  . iron polysaccharides (NIFEREX) capsule 150 mg  150 mg Oral Daily Arta Silence, MD   150 mg at 08/11/18 1737  . FLUoxetine (PROZAC) capsule 20 mg  20 mg Oral Daily Arta Silence, MD   20 mg at 08/11/18 1737  . heparin injection 5,000 Units  5,000 Units Subcutaneous Q8H [name], MD      . hydrALAZINE (APRESOLINE) tablet 50 mg  50 mg Oral BID Arta Silence, MD      . HYDROmorphone (DILAUDID) injection 0.5 mg  0.5 mg Intravenous Q3H PRN Arta Silence, MD   0.5 mg at 08/11/18 2123  . insulin aspart (novoLOG) injection 0-15 Units  0-15 Units Subcutaneous TID WC Sridharan, Prasanna, MD      . insulin aspart (novoLOG) injection 0-5 Units  0-5 Units Subcutaneous QHS Jodell Cipro, Prasanna, MD      . irbesartan (AVAPRO) tablet 300 mg  300 mg Oral Daily [name], MD      . isosorbide mononitrate (IMDUR) 24 hr tablet 30 mg  30 mg Oral QHS Arta Silence, MD   30 mg at 08/11/18 2123  . levothyroxine (SYNTHROID, LEVOTHROID) tablet 75 mcg  75 mcg Oral QAC breakfast Arta Silence, MD   75 mcg at 08/12/18 0542  . ondansetron (ZOFRAN) tablet 4 mg  4 mg Oral Q6H PRN Arta Silence, MD       Or  . ondansetron (ZOFRAN) injection 4 mg  4 mg Intravenous Q6H PRN Arta Silence, MD      . rosuvastatin (CRESTOR) tablet 40 mg  40 mg Oral QPM Arta Silence, MD   40 mg at 08/11/18 1737  . senna-docusate (Senokot-S) tablet 1 tablet  1 tablet Oral QHS PRN Arta Silence, MD      . tranexamic acid (CYKLOKAPRON) 2,000 mg in sodium chloride 0.9 % 50 mL Topical Application  7,124 mg Topical Once Lovell Sheehan, MD      .  traZODone (DESYREL) tablet 50 mg  50 mg Oral QHS Arta Silence, MD   50 mg at 08/11/18 2123     Discharge Medications: Please see discharge summary for a list of discharge medications.  Relevant Imaging Results:  Relevant Lab Results:   Additional Information [phone] [facility] [address] [address], [place], [place]

## 2018-08-12 NOTE — Progress Notes (Signed)
[facility] [place] NAME: Dawn Bradford    MR#:  [id number]  DATE OF BIRTH:  Apr 12, 1936  SUBJECTIVE:  Pt's surgery cancelled. Planning spinal anesthesia. plavix last dose friday  REVIEW OF SYSTEMS:   Review of Systems  Constitutional: Negative for chills, fever and weight loss.  HENT: Negative for ear discharge, ear pain and nosebleeds.   Eyes: Negative for blurred vision, pain and discharge.  Respiratory: Negative for sputum production, shortness of breath, wheezing and stridor.   Cardiovascular: Negative for chest pain, palpitations, orthopnea and PND.  Gastrointestinal: Negative for abdominal pain, diarrhea, nausea and vomiting.  Genitourinary: Negative for frequency and urgency.  Musculoskeletal: Positive for joint pain. Negative for back pain.  Neurological: Negative for sensory change, speech change, focal weakness and weakness.  Psychiatric/Behavioral: Negative for depression and hallucinations. The patient is not nervous/anxious.    Tolerating Diet:yes Tolerating PT: pending surgery  DRUG ALLERGIES:   Allergies  Allergen Reactions  . Ace Inhibitors Other (See Comments)    Reaction:  Unknown   . Amlodipine Other (See Comments)    Reaction:  Unknown   . Clonidine Hydrochloride Other (See Comments)    Reaction:  Unknown   . Codeine Hives  . Latex Hives  . Metformin Nausea And Vomiting  . Tape Rash    VITALS:  Blood pressure (!) 137/59, pulse 68, temperature 98.2 F (36.8 C), temperature source Oral, resp. rate 16, height 5\' 3"  (1.6 m), weight 103.3 kg, last menstrual period 02/01/1986, SpO2 93 %.  PHYSICAL EXAMINATION:   Physical Exam  GENERAL:  82 y.o.-year-old patient lying in the bed with no acute distress.  EYES: Pupils equal, round, reactive to light and accommodation. No scleral icterus. Extraocular muscles intact.  HEENT: Head atraumatic, normocephalic. Oropharynx and nasopharynx clear.  NECK:  Supple, no  jugular venous distention. No thyroid enlargement, no tenderness.  LUNGS: Normal breath sounds bilaterally, no wheezing, rales, rhonchi. No use of accessory muscles of respiration.  CARDIOVASCULAR: S1, S2 normal. No murmurs, rubs, or gallops.  ABDOMEN: Soft, nontender, nondistended. Bowel sounds present. No organomegaly or mass.  EXTREMITIES: No cyanosis, clubbing or edema b/l.   Right hip deformity due to fracture NEUROLOGIC: Cranial nerves II through XII are intact. No focal Motor or sensory deficits b/l.   PSYCHIATRIC:  patient is alert and oriented x 3.  SKIN: No obvious rash, lesion, or ulcer.   LABORATORY PANEL:  CBC Recent Labs  Lab 08/12/18 0357  WBC 8.9  HGB 8.7*  HCT 26.6*  PLT 99*    Chemistries  Recent Labs  Lab 08/11/18 0057 08/12/18 0357  NA 133* 134*  K 3.6 4.1  CL 99 98  CO2 26 29  GLUCOSE 147* 131*  BUN 16 27*  CREATININE 2.20* 3.83*  CALCIUM 8.1* 8.5*  MG 1.8  --   AST 25  --   ALT 14  --   ALKPHOS 50  --   BILITOT 0.8  --    Cardiac Enzymes No results for input(s): TROPONINI in the last 168 hours. RADIOLOGY:  Dg Chest 1 View  Result Date: 08/11/2018 CLINICAL DATA:  Fall EXAM: CHEST  1 VIEW COMPARISON:  Radiograph 10/14/2017 FINDINGS: Sternotomy wires overlie normal cardiac silhouette. Mild central venous congestion similar prior. No infiltrate. No pneumothorax. IMPRESSION: Mild central venous congestion. Midline sternotomy. Electronically Signed   By: Suzy Bouchard M.D.   On: 08/11/2018 01:17   Dg Hip Unilat W Or Wo Pelvis 2-3 Views  Right  Result Date: 08/11/2018 CLINICAL DATA:  Fall, hip pain EXAM: DG HIP (WITH OR WITHOUT PELVIS) 2-3V RIGHT COMPARISON:  None. FINDINGS: Intertrochanteric fracture of the proximal RIGHT femur with superior migration of the distal fracture fragment. IMPRESSION: Acute intertrochanteric RIGHT femur fracture Electronically Signed   By: Suzy Bouchard M.D.   On: 08/11/2018 01:16   ASSESSMENT AND PLAN:  Dawn Bradford  is a 82 y.o. female with medical history as listed below which notably includes end-stage renal disease on hemodialysis, history of CAD with a prior MI on Plavix and aspirin, sleep apnea, and at least some degree of chronic CHF.  She presents by EMS for evaluation of right hip pain that was acute in onset and severe. She fell coming out of the BR.  1. Acute right hip fracture s/p mechanical fall at home -Appreciate Dr Harlow Mares input--for surgery now under Spinal on wednesday-prn pain meds -d/c IVF -npo after midnight on tuesday  2.h/o CAD and Chronic diastolic CHF -seen by Dr End-- high perioperative risk. No further work up recommended. Pt best at baseline -cont cardiac meds  3/ESRD on HD -seen by Dr Holley Raring  4. Anemia of CKD -monitor hgb  5. HTN Continue carvedilol, hydralazine, irbesartan for blood pressure control.  D/w family  Case discussed with Care Management/Social Worker. Management plans discussed with the patient, family and they are in agreement.  CODE STATUS: full  DVT Prophylaxis: SCD and heparin till Tuesday night  TOTAL TIME TAKING CARE OF THIS PATIENT: **30* minutes.  >50% time spent on counselling and coordination of care  POSSIBLE D/C IN 2-3 DAYS, DEPENDING ON CLINICAL CONDITION.  Note: This dictation was prepared with Dragon dictation along with smaller phrase technology. Any transcriptional errors that result from this process are unintentional.  [name] M.D on 08/12/2018 at 3:54 PM  Between 7am to 6pm - Pager - [phone]  After 6pm go to [email or web address] - Proofreader  [facility]  Office  [phone]  CC: Primary care physician; Hortencia Pilar, MDPatient ID: Dawn Bradford, female   DOB: [birth year], 82 y.o.   MRN: [id number]

## 2018-08-13 LAB — BASIC METABOLIC PANEL
Anion gap: 7 (ref 5–15)
BUN: 37 mg/dL — ABNORMAL HIGH (ref 8–23)
CO2: 28 mmol/L (ref 22–32)
Calcium: 8.3 mg/dL — ABNORMAL LOW (ref 8.9–10.3)
Chloride: 97 mmol/L — ABNORMAL LOW (ref 98–111)
Creatinine, Ser: 4.39 mg/dL — ABNORMAL HIGH (ref 0.44–1.00)
GFR calc Af Amer: 10 mL/min — ABNORMAL LOW (ref >60)
GFR, EST NON AFRICAN AMERICAN: 9 mL/min — AB (ref >60)
Glucose, Bld: 132 mg/dL — ABNORMAL HIGH (ref 70–99)
POTASSIUM: 3.8 mmol/L (ref 3.5–5.1)
Sodium: 132 mmol/L — ABNORMAL LOW (ref 135–145)

## 2018-08-13 LAB — PHOSPHORUS: Phosphorus: 4.1 mg/dL (ref 2.5–4.6)

## 2018-08-13 LAB — GLUCOSE, CAPILLARY
Glucose-Capillary: 115 mg/dL — ABNORMAL HIGH (ref 70–99)
Glucose-Capillary: 101 mg/dL — ABNORMAL HIGH (ref 70–99)
Glucose-Capillary: 127 mg/dL — ABNORMAL HIGH (ref 70–99)

## 2018-08-13 LAB — CALCIUM, IONIZED: Calcium, Ionized, Serum: 4.6 mg/dL (ref 4.5–5.6)

## 2018-08-13 MED ORDER — FLUCONAZOLE 50 MG PO TABS
150.0000 mg | ORAL_TABLET | Freq: Once | ORAL | Status: DC
  Filled 2018-08-13: qty 1

## 2018-08-13 MED ORDER — SODIUM CHLORIDE 0.9 % IV SOLN: 100.0000 mL | INTRAVENOUS | Status: DC | PRN

## 2018-08-13 MED ORDER — TRAMADOL HCL 50 MG PO TABS
50.0000 mg | ORAL_TABLET | Freq: Three times a day (TID) | ORAL | Status: DC | PRN
  Administered 2018-08-13 – 2018-08-16 (×5): 50 mg via ORAL
  Filled 2018-08-13 (×7): qty 1

## 2018-08-13 MED ORDER — PENTAFLUOROPROP-TETRAFLUOROETH EX AERO
1.0000 "application " | INHALATION_SPRAY | CUTANEOUS | Status: DC | PRN
  Filled 2018-08-13: qty 30

## 2018-08-13 MED ORDER — LIDOCAINE-PRILOCAINE 2.5-2.5 % EX CREA
1.0000 "application " | TOPICAL_CREAM | CUTANEOUS | Status: DC | PRN
  Filled 2018-08-13: qty 5

## 2018-08-13 MED ORDER — ALTEPLASE 2 MG IJ SOLR
2.0000 mg | Freq: Once | INTRAMUSCULAR | Status: DC | PRN
  Filled 2018-08-13: qty 2

## 2018-08-13 MED ORDER — ACETAMINOPHEN 325 MG PO TABS
650.0000 mg | ORAL_TABLET | Freq: Once | ORAL | Status: AC
  Administered 2018-08-13: 650 mg via ORAL
  Filled 2018-08-13: qty 2

## 2018-08-13 MED ORDER — CHLORHEXIDINE GLUCONATE CLOTH 2 % EX PADS
6.0000 | MEDICATED_PAD | Freq: Every day | CUTANEOUS | Status: DC
  Administered 2018-08-13 – 2018-08-15 (×2): 6 via TOPICAL

## 2018-08-13 MED ORDER — LIDOCAINE HCL (PF) 1 % IJ SOLN
5.0000 mL | INTRAMUSCULAR | Status: DC | PRN
  Filled 2018-08-13: qty 5

## 2018-08-13 MED ORDER — HEPARIN SODIUM (PORCINE) 1000 UNIT/ML DIALYSIS
1000.0000 [IU] | INTRAMUSCULAR | Status: DC | PRN
  Filled 2018-08-13: qty 1

## 2018-08-13 NOTE — Progress Notes (Signed)
HD Pre Assessment    08/13/18 1147  Neurological  Level of Consciousness Alert  Orientation Level Oriented to person;Oriented to situation;Disoriented to place;Disoriented to time  Respiratory  Respiratory Pattern Regular;Unlabored;Symmetrical  Chest Assessment Chest expansion symmetrical  Bilateral Breath Sounds Clear  Cough None  Cardiac  Pulse Regular  Heart Sounds S1, S2  Jugular Venous Distention (JVD) No  ECG Monitor Yes  Cardiac Rhythm NSR  Antiarrhythmic device No  Vascular  R Radial Pulse +2  L Radial Pulse +2  R Dorsalis Pedis Pulse +2  L Dorsalis Pedis Pulse +1  Edema Generalized  Generalized Edema +1  Integumentary  Integumentary (WDL) X  Skin Color Appropriate for ethnicity  Skin Condition Dry  Skin Integrity Abrasion;Ecchymosis  Musculoskeletal  Musculoskeletal (WDL) X  Generalized Weakness Yes  GU Assessment  Genitourinary (WDL) X (HD pt)  Psychosocial  Psychosocial (WDL) WDL

## 2018-08-13 NOTE — Care Management (Signed)
RNCM met briefly with patient and her daughter Chavie.  Patient is from home with private paid caregivers for her and her husband that recently had gallbladder surgery. RNCM provided list of home health care agencies by CMS.gov with 3-5 star ratings.  [facility] past.  They have also hired a Tax inspector to assist with wound care for patient husband. Surgery pending Wednesday- related to Plavix on hold.  Home O2 through [place].  Hemodialysis transportation by hired caregiver.  RNCM will follow.

## 2018-08-13 NOTE — Clinical Social Work Placement (Signed)
   CLINICAL SOCIAL WORK PLACEMENT  NOTE  Date:  08/13/2018  Patient Details  Name: Dawn Bradford MRN: [id number] Date of Birth: [birth year]  Clinical Social Work is seeking post-discharge placement for this patient at the [place] level of care (*CSW will initial, date and re-position this form in  chart as items are completed):  Yes   Patient/family provided with [place] Work Department's list of facilities offering this level of care within the geographic area requested by the patient (or if unable, by the patient's family).  Yes   Patient/family informed of their freedom to choose among providers that offer the needed level of care, that participate in Medicare, Medicaid or managed care program needed by the patient, have an available bed and are willing to accept the patient.  Yes   Patient/family informed of [facility]'s ownership interest in [facility] and [facility], as well as of the fact that they are under no obligation to receive care at these facilities.  PASRR submitted to EDS on       PASRR number received on       Existing PASRR number confirmed on 08/12/18     FL2 transmitted to all facilities in geographic area requested by pt/family on 08/12/18     FL2 transmitted to all facilities within larger geographic area on       Patient informed that his/her managed care company has contracts with or will negotiate with certain facilities, including the following:            Patient/family informed of bed offers received.  Patient chooses bed at       Physician recommends and patient chooses bed at      Patient to be transferred to   on  .  Patient to be transferred to facility by       Patient family notified on   of transfer.  Name of family member notified:        PHYSICIAN       Additional Comment:    _______________________________________________ Banyan Goodchild, Veronia Beets, LCSW 08/13/2018, 4:37 PM

## 2018-08-13 NOTE — Progress Notes (Signed)
[facility]  ROUNDING NOTE   Subjective:   Seen and examined on hemodialysis.     HEMODIALYSIS FLOWSHEET:  Blood Flow Rate (mL/min): 400 mL/min Arterial Pressure (mmHg): -160 mmHg Venous Pressure (mmHg): 190 mmHg Transmembrane Pressure (mmHg): 70 mmHg Ultrafiltration Rate (mL/min): 580 mL/min Dialysate Flow Rate (mL/min): 800 ml/min Conductivity: Machine : 15.4 Conductivity: Machine : 15.4 Dialysis Fluid Bolus: Normal Saline Bolus Amount (mL): 250 mL    Objective:  Vital signs in last 24 hours:  Temp:  [97.5 F (36.4 C)-98.7 F (37.1 C)] 98.2 F (36.8 C) (12/09 1200) Pulse Rate:  [75-102] 95 (12/09 1530) Resp:  [15-25] 20 (12/09 1530) BP: (131-163)/(41-70) 145/52 (12/09 1515) SpO2:  [96 %-100 %] 97 % (12/09 1530) Weight:  [426 kg] 108 kg (12/09 1200)  Weight change:  Filed Weights   08/11/18 0009 08/13/18 1200  Weight: 103.3 kg 108 kg    Intake/Output: I/O last 3 completed shifts: In: 360 [P.O.:360] Out: 200 [Urine:200]   Intake/Output this shift:  Total I/O In: 120 [P.O.:120] Out: -   Physical Exam: General: No acute distress  Head: Normocephalic, atraumatic. Moist oral mucosal membranes  Eyes: Anicteric  Neck: Supple, trachea midline  Lungs:  Clear to auscultation, normal effort  Heart: S1S2 no rubs  Abdomen:  Soft, nontender, bowel sounds present  Extremities: No peripheral edema.  Neurologic: Awake, alert, following commands  Skin: No lesions  Access: LUE AVF    Basic Metabolic Panel: Recent Labs  Lab 08/11/18 0057 08/12/18 0357 08/13/18 0333 08/13/18 1206  NA 133* 134* 132*  --   K 3.6 4.1 3.8  --   CL 99 98 97*  --   CO2 26 29 28   --   GLUCOSE 147* 131* 132*  --   BUN 16 27* 37*  --   CREATININE 2.20* 3.83* 4.39*  --   CALCIUM 8.1* 8.5* 8.3*  --   MG 1.8  --   --   --   PHOS 2.7  --   --  4.1    Liver Function Tests: Recent Labs  Lab 08/11/18 0057  AST 25  ALT 14  ALKPHOS 50  BILITOT 0.8  PROT 5.6*   ALBUMIN 3.3*   No results for input(s): LIPASE, AMYLASE in the last 168 hours. No results for input(s): AMMONIA in the last 168 hours.  CBC: Recent Labs  Lab 08/11/18 0057 08/12/18 0357  WBC 10.2 8.9  NEUTROABS 8.0*  --   HGB 9.8* 8.7*  HCT 29.3* 26.6*  MCV 107.3* 109.0*  PLT 90* 99*    Cardiac Enzymes: No results for input(s): CKTOTAL, CKMB, CKMBINDEX, TROPONINI in the last 168 hours.  BNP: Invalid input(s): POCBNP  CBG: Recent Labs  Lab 08/12/18 0736 08/12/18 1259 08/12/18 1627 08/12/18 2234 08/13/18 0749  GLUCAP 103* 105* 172* 113* 115*    Microbiology: Results for orders placed or performed during the hospital encounter of 10/11/17  MRSA PCR Screening     Status: None   Collection Time: 10/14/17  5:30 PM  Result Value Ref Range Status   MRSA by PCR NEGATIVE NEGATIVE Final    Comment:        The GeneXpert MRSA Assay (FDA approved for NASAL specimens only), is one component of a comprehensive MRSA colonization surveillance program. It is not intended to diagnose MRSA infection nor to guide or monitor treatment for MRSA infections. Performed at [facility], [address], [place], [place] [address]     Coagulation Studies: Recent  Labs    08/11/18 0057  LABPROT 13.9  INR 1.08    Urinalysis: No results for input(s): COLORURINE, LABSPEC, PHURINE, GLUCOSEU, HGBUR, BILIRUBINUR, KETONESUR, PROTEINUR, UROBILINOGEN, NITRITE, LEUKOCYTESUR in the last 72 hours.  Invalid input(s): APPERANCEUR    Imaging: No results found.   Medications:   . sodium chloride    . sodium chloride    .  ceFAZolin (ANCEF) IV     . aspirin EC  81 mg Oral Daily  . carvedilol  25 mg Oral Q M,W,F-1800  . Chlorhexidine Gluconate Cloth  6 each Topical Q0600  . cholecalciferol  5,000 Units Oral Daily  . epoetin (EPOGEN/PROCRIT) injection  10,000 Units Intravenous Q M,W,F-HD  . Ferrous Fumarate  1 tablet Oral Daily   And  . iron polysaccharides  150 mg Oral  Daily  . FLUoxetine  20 mg Oral Daily  . heparin injection (subcutaneous)  5,000 Units Subcutaneous Q8H  . hydrALAZINE  50 mg Oral BID  . insulin aspart  0-15 Units Subcutaneous TID WC  . insulin aspart  0-5 Units Subcutaneous QHS  . irbesartan  300 mg Oral Daily  . isosorbide mononitrate  30 mg Oral QHS  . levothyroxine  75 mcg Oral QAC breakfast  . rosuvastatin  40 mg Oral QPM  . tranexamic acid (CYKLOKAPRON) topical -INTRAOP  2,000 mg Topical Once  . traZODone  50 mg Oral QHS   sodium chloride, sodium chloride, acetaminophen **OR** acetaminophen, albuterol, alteplase, bisacodyl, heparin, HYDROmorphone (DILAUDID) injection, lidocaine (PF), lidocaine-prilocaine, ondansetron **OR** ondansetron (ZOFRAN) IV, pentafluoroprop-tetrafluoroeth, senna-docusate  Assessment/ Plan:  82 y.o. female with end-stage renal disease, coronary disease, carotid stenosis, history of stroke, diabetes, CABG, obstructive sleep apnea, history of renal artery stenosis, admitfed for right hip fracture 08/10/18.  [employer] [facility]/MWF/[facility]/99.5  1.  ESRD on HD MWF. Seen and examined on hemodialysis. Tolerating treatment well.   2.  Anemia of chronic kidney disease.   Hemoglobin 8.7 - EPO 10000  3.  Hypertension: blood pressure elevated due to pain.  - carvedilol, hydralazine, irbesartan.  4. Secondary Hyperparathyroidism: PTH low at 100. Outpatient phosphorus and calcium at goal.  - not currently on binders.    LOS: 2 Torian Thoennes 12/9/20193:43 PM

## 2018-08-13 NOTE — Clinical Social Work Note (Signed)
Clinical Social Work Assessment  Patient Details  Name: Dawn Bradford MRN: [id number] Date of Birth: [birth year]  Date of referral:  08/13/18               Reason for consult:  Facility Placement                Permission sought to share information with:  Chartered certified accountant granted to share information::  Yes, Verbal Permission Granted  Name::      [place]::   [place]   Relationship::     Contact Information:     Housing/Transportation Living arrangements for the past 2 months:  [place] of Information:  Patient, Adult Children Patient Interpreter Needed:  None Criminal Activity/Legal Involvement Pertinent to Current Situation/Hospitalization:  No - Comment as needed Significant Relationships:  Adult Children, Spouse Lives with:  Spouse Do you feel safe going back to the place where you live?  Yes Need for family participation in patient care:  Yes (Comment)  Care giving concerns:  Patient lives in [place] with her husband Dawn Bradford.    Social Worker assessment / plan:  Holiday representative ([place]) reviewed chart and noted that patient had a hip fracture. Per chart patient will have surgery Wednesday 08/15/18. Patient is on dialysis MWF [facility]. At 11:30 am. Dawn Bradford dialysis coordinator is aware of admission. CSW met with patient and her daughter Dawn Bradford [phone] was at bedside. Patient was alert and oriented X3 and was laying in the bed. CSW introduced self and explained role of CSW department. Per patient she lives in [place] with her husband Dawn Bradford and they have a private duty caregiver Dawn Bradford that transports patient to dialysis. CSW explained that after surgery PT will evaluate patient and make a recommendation of home health or SNF. CSW explained that medicare requires a 3 night qualifying inpatient stay in a hospital in order to pay for SNF. Patient was admitted to inpatient 08/11/18.  Patient and her daughter prefer SNF and requested [facility], [facility] or [facility]. Per daughter patient has been to [facility] in the past and did not have a good experience. Per daughter patient's caregiver Dawn Bradford will transport patient to dialysis while she is in SNF if needed. FL2 complete and faxed out. CSW will continue to follow and assist as needed.    Employment status:  Disabled (Comment on whether or not currently receiving Disability), Retired Nurse, adult PT Recommendations:  Not assessed at this time Information / Referral to community resources:  [place]  Patient/Family's Response to care:  Patient is agreeable to [facility] in [place].   Patient/Family's Understanding of and Emotional Response to Diagnosis, Current Treatment, and Prognosis:  Patient and her daughter were very pleasant and thanked CSW for assistance.   Emotional Assessment Appearance:  Appears stated age Attitude/Demeanor/Rapport:    Affect (typically observed):  Accepting, Adaptable, Pleasant Orientation:  Oriented to Self, Oriented to Place, Oriented to  Time, Oriented to Situation Alcohol / Substance use:  Not Applicable Psych involvement (Current and /or in the community):  No (Comment)  Discharge Needs  Concerns to be addressed:  Discharge Planning Concerns Readmission within the last 30 days:  No Current discharge risk:  Dependent with Mobility Barriers to Discharge:  Continued Medical Work up   [employer], Veronia Beets, LCSW 08/13/2018, 4:59 PM

## 2018-08-13 NOTE — Plan of Care (Signed)
  Problem: Education: Goal: Verbalization of understanding the information provided (i.e., activity precautions, restrictions, etc) will improve Outcome: Progressing Goal: Individualized Educational Video(s) Outcome: Progressing   Problem: Activity: Goal: Ability to ambulate and perform ADLs will improve Outcome: Progressing   Problem: Clinical Measurements: Goal: Postoperative complications will be avoided or minimized Outcome: Progressing   Problem: Self-Concept: Goal: Ability to maintain and perform role responsibilities to the fullest extent possible will improve Outcome: Progressing   Problem: Pain Management: Goal: Pain level will decrease Outcome: Progressing   Problem: Education: Goal: Knowledge of General Education information will improve Description Including pain rating scale, medication(s)/side effects and non-pharmacologic comfort measures Outcome: Progressing   Problem: Health Behavior/Discharge Planning: Goal: Ability to manage health-related needs will improve Outcome: Progressing   Problem: Clinical Measurements: Goal: Ability to maintain clinical measurements within normal limits will improve Outcome: Progressing Goal: Will remain free from infection Outcome: Progressing Goal: Diagnostic test results will improve Outcome: Progressing Goal: Respiratory complications will improve Outcome: Progressing Goal: Cardiovascular complication will be avoided Outcome: Progressing   Problem: Activity: Goal: Risk for activity intolerance will decrease Outcome: Progressing   Problem: Nutrition: Goal: Adequate nutrition will be maintained Outcome: Progressing   Problem: Coping: Goal: Level of anxiety will decrease Outcome: Progressing

## 2018-08-13 NOTE — Progress Notes (Signed)
HD Treatment Initiated    08/13/18 1206  Vital Signs  Pulse Rate 89  Resp 18  During Hemodialysis Assessment  Blood Flow Rate (mL/min) 400 mL/min  Arterial Pressure (mmHg) -150 mmHg  Venous Pressure (mmHg) 170 mmHg  Transmembrane Pressure (mmHg) 70 mmHg  Ultrafiltration Rate (mL/min) 570 mL/min  Dialysate Flow Rate (mL/min) 800 ml/min  Conductivity: Machine  14.1  HD Safety Checks Performed Yes  Dialysis Fluid Bolus Normal Saline  Bolus Amount (mL) 250 mL  Intra-Hemodialysis Comments Tx initiated  Fistula / Graft Left Forearm Arteriovenous fistula  No Placement Date or Time found.   Placed prior to admission: Yes  Orientation: Left  Access Location: Forearm  Access Type: Arteriovenous fistula  Status Accessed  Needle Size 15

## 2018-08-13 NOTE — Progress Notes (Signed)
Post HD Assessment    08/13/18 1550  Neurological  Level of Consciousness Alert  Orientation Level Oriented to person;Oriented to situation;Disoriented to place;Disoriented to time  Respiratory  Respiratory Pattern Regular;Unlabored;Symmetrical  Chest Assessment Chest expansion symmetrical  Bilateral Breath Sounds Clear  Cardiac  Pulse Regular  Heart Sounds S1, S2  Jugular Venous Distention (JVD) No  ECG Monitor Yes  Cardiac Rhythm NSR  Antiarrhythmic device No  Vascular  R Radial Pulse +2  L Radial Pulse +2  R Dorsalis Pedis Pulse +2  L Dorsalis Pedis Pulse +1  Edema Generalized  Generalized Edema +1  Integumentary  Integumentary (WDL) X  Skin Color Appropriate for ethnicity  Skin Condition Dry  Skin Integrity Abrasion;Ecchymosis  Musculoskeletal  Musculoskeletal (WDL) X  Generalized Weakness Yes  GU Assessment  Genitourinary (WDL) X (HD pt)  Psychosocial  Psychosocial (WDL) WDL

## 2018-08-13 NOTE — Progress Notes (Signed)
HD Treatment Complete    08/13/18 1539  Vital Signs  Pulse Rate 92  Pulse Rate Source Monitor  Resp 16  During Hemodialysis Assessment  Blood Flow Rate (mL/min) 400 mL/min  Arterial Pressure (mmHg) -160 mmHg  Venous Pressure (mmHg) 200 mmHg  Transmembrane Pressure (mmHg) 70 mmHg  Ultrafiltration Rate (mL/min) 580 mL/min  Dialysate Flow Rate (mL/min) 800 ml/min  Conductivity: Machine  14  HD Safety Checks Performed Yes  Intra-Hemodialysis Comments Progressing as prescribed;Tolerated well;Tx completed (UF 1924)

## 2018-08-13 NOTE — Progress Notes (Signed)
Pre HD Treatment    08/13/18 1200  Vital Signs  Temp 98.2 F (36.8 C)  Temp Source Oral  Pulse Rate 89  Pulse Rate Source Monitor  Resp 19  BP (!) 160/66  BP Location Right Arm  BP Method Automatic  Patient Position (if appropriate) Lying  Oxygen Therapy  SpO2 100 %  O2 Device Nasal Cannula  O2 Flow Rate (L/min) 2 L/min  Pain Assessment  Pain Scale 0-10  Pain Score 0  Dialysis Weight  Weight 108 kg  Type of Weight Pre-Dialysis  Time-Out for Hemodialysis  What Procedure? HD  Pt Identifiers(min of two) First/Last Name;MRN/Account#;Pt's DOB(use if MRN/Acct# not available  Correct Site? Yes  Correct Side? Yes  Correct Procedure? Yes  Consents Verified? Yes  Rad Studies Available? N/A  Safety Precautions Reviewed? Yes  Engineer, civil (consulting) Number 7  Station Number 4  UF/Alarm Test Passed  Conductivity: Meter 14  Conductivity: Machine  14  pH 7.2  Reverse Osmosis Main  Normal Saline Lot Number [id number]  Dialyzer Lot Number [id number]  Disposable Set Lot Number [id number]  Machine Temperature 98.6 F (37 C)  Musician and Audible Yes  Blood Lines Intact and Secured Yes  Pre Treatment Patient Checks  Vascular access used during treatment Fistula  Hepatitis B Surface Antigen Results Negative  Date Hepatitis B Surface Antigen Drawn 08/23/18  Hepatitis B Surface Antibody  (>10)  Date Hepatitis B Surface Antibody Drawn 08/23/18  Hemodialysis Consent Verified Yes  Hemodialysis Standing Orders Initiated Yes  ECG (Telemetry) Monitor On Yes  Prime Ordered Normal Saline  Length of  DialysisTreatment -hour(s) 3.5 Hour(s)  Dialysis Treatment Comments Na 140  Dialyzer Elisio 17H NR  Dialysate 2K, 2.5 Ca  Variable Sodium Other (Comment)  Dialysis Anticoagulant None  Dialysate Flow Ordered 800  Blood Flow Rate Ordered 400 mL/min  Ultrafiltration Goal 1.5 Liters  Pre Treatment Labs Renal panel;Phosphorus;Other (Comment)  Dialysis Blood Pressure Support Ordered Normal  Saline  Education / Care Plan  Dialysis Education Provided Yes  Documented Education in Care Plan Yes  Fistula / Graft Left Forearm Arteriovenous fistula  No Placement Date or Time found.   Placed prior to admission: Yes  Orientation: Left  Access Location: Forearm  Access Type: Arteriovenous fistula  Site Condition No complications  Fistula / Graft Assessment Present;Thrill;Bruit  Drainage Description None

## 2018-08-13 NOTE — Progress Notes (Signed)
Patient with no IV access returned fromHD with pain. MD paged. New order for Ultram given. RN to override allergy contraindication per MD.

## 2018-08-13 NOTE — Progress Notes (Signed)
Post HD Treatment  MD ordered patient to only run 3.25 hours. He is aware of treatment time and patient's pain. He ordered an extra dose of Tylenol during treatment. Her net UF was 1524 and her BVP was 74.3. MD at bedside currently. Patient did not meet UF goal.     08/13/18 1545  Hand-Off documentation  Report given to (Full Name) Marlinda Mike  Report received from (Full Name) Stephannie Peters, RN  Vital Signs  Temp 98.2 F (36.8 C)  Temp Source Oral  Pulse Rate 95  Pulse Rate Source Monitor  Resp (!) 21  BP (!) 158/59  BP Location Right Arm  BP Method Automatic  Patient Position (if appropriate) Lying  Oxygen Therapy  SpO2 99 %  O2 Device Nasal Cannula  O2 Flow Rate (L/min) 2 L/min  Pain Assessment  Pain Scale 0-10  Pain Score 10  Pain Type Acute pain  Pain Location Hip  Pain Orientation Right  Pain Descriptors / Indicators Aching  Pain Frequency Constant  Pain Onset On-going  Patients Stated Pain Goal 2  Pain Intervention(s) RN made aware  Multiple Pain Sites No  Dialysis Weight  Weight 107.2 kg  Type of Weight Post-Dialysis  Post-Hemodialysis Assessment  Rinseback Volume (mL) 250 mL  KECN 260 V  Dialyzer Clearance Lightly streaked  Duration of HD Treatment -hour(s) 3.25 hour(s)  Hemodialysis Intake (mL) 500 mL  UF Total -Machine (mL) 1924 mL  Net UF (mL) 1424 mL  Tolerated HD Treatment Yes  Post-Hemodialysis Comments Pt tolerated treatment well  Fistula / Graft Left Forearm Arteriovenous fistula  No Placement Date or Time found.   Placed prior to admission: Yes  Orientation: Left  Access Location: Forearm  Access Type: Arteriovenous fistula  Site Condition No complications  Fistula / Graft Assessment Present;Thrill;Bruit  Status Deaccessed  Drainage Description None

## 2018-08-13 NOTE — Progress Notes (Signed)
[facility] [place] NAME: Dawn Bradford    MR#:  [id number]  DATE OF BIRTH:  1936/05/12  SUBJECTIVE:  Pt's surgery cancelled. Planning spinal anesthesia. plavix last dose friday No new issues per RN REVIEW OF SYSTEMS:   Review of Systems  Constitutional: Negative for chills, fever and weight loss.  HENT: Negative for ear discharge, ear pain and nosebleeds.   Eyes: Negative for blurred vision, pain and discharge.  Respiratory: Negative for sputum production, shortness of breath, wheezing and stridor.   Cardiovascular: Negative for chest pain, palpitations, orthopnea and PND.  Gastrointestinal: Negative for abdominal pain, diarrhea, nausea and vomiting.  Genitourinary: Negative for frequency and urgency.  Musculoskeletal: Positive for joint pain. Negative for back pain.  Neurological: Negative for sensory change, speech change, focal weakness and weakness.  Psychiatric/Behavioral: Negative for depression and hallucinations. The patient is not nervous/anxious.    Tolerating Diet:yes Tolerating PT: pending surgery  DRUG ALLERGIES:   Allergies  Allergen Reactions  . Ace Inhibitors Other (See Comments)    Reaction:  Unknown   . Amlodipine Other (See Comments)    Reaction:  Unknown   . Clonidine Hydrochloride Other (See Comments)    Reaction:  Unknown   . Codeine Hives  . Latex Hives  . Metformin Nausea And Vomiting  . Tape Rash    VITALS:  Blood pressure (!) 157/64, pulse 98, temperature (!) 97.5 F (36.4 C), temperature source Oral, resp. rate 18, height 5\' 3"  (1.6 m), weight 103.3 kg, last menstrual period 02/01/1986, SpO2 98 %.  PHYSICAL EXAMINATION:   Physical Exam  GENERAL:  82 y.o.-year-old patient lying in the bed with no acute distress.  EYES: Pupils equal, round, reactive to light and accommodation. No scleral icterus. Extraocular muscles intact.  HEENT: Head atraumatic, normocephalic. Oropharynx and nasopharynx  clear.  NECK:  Supple, no jugular venous distention. No thyroid enlargement, no tenderness.  LUNGS: Normal breath sounds bilaterally, no wheezing, rales, rhonchi. No use of accessory muscles of respiration.  CARDIOVASCULAR: S1, S2 normal. No murmurs, rubs, or gallops.  ABDOMEN: Soft, nontender, nondistended. Bowel sounds present. No organomegaly or mass.  EXTREMITIES: No cyanosis, clubbing or edema b/l.   Right hip deformity due to fracture NEUROLOGIC: Cranial nerves II through XII are intact. No focal Motor or sensory deficits b/l.   PSYCHIATRIC:  patient is alert and oriented x 3.  SKIN: No obvious rash, lesion, or ulcer.   LABORATORY PANEL:  CBC Recent Labs  Lab 08/12/18 0357  WBC 8.9  HGB 8.7*  HCT 26.6*  PLT 99*    Chemistries  Recent Labs  Lab 08/11/18 0057  08/13/18 0333  NA 133*   < > 132*  K 3.6   < > 3.8  CL 99   < > 97*  CO2 26   < > 28  GLUCOSE 147*   < > 132*  BUN 16   < > 37*  CREATININE 2.20*   < > 4.39*  CALCIUM 8.1*   < > 8.3*  MG 1.8  --   --   AST 25  --   --   ALT 14  --   --   ALKPHOS 50  --   --   BILITOT 0.8  --   --    < > = values in this interval not displayed.   Cardiac Enzymes No results for input(s): TROPONINI in the last 168 hours. RADIOLOGY:  No results found. ASSESSMENT AND PLAN:  Dawn Bradford is a 82 y.o. female with medical history as listed below which notably includes end-stage renal disease on hemodialysis, history of CAD with a prior MI on Plavix and aspirin, sleep apnea, and at least some degree of chronic CHF.  She presents by EMS for evaluation of right hip pain that was acute in onset and severe. She fell coming out of the BR.  1. Acute right hip fracture s/p mechanical fall at home -Appreciate Dr Harlow Mares input--for surgery now under Spinal on wednesday-prn pain meds -d/c IVF -npo after midnight on tuesday  2.h/o CAD and Chronic diastolic CHF -seen by Dr End-- high perioperative risk. No further work up recommended. Pt  best at baseline -cont cardiac meds  3/ESRD on HD -seen by Dr Holley Raring  4. Anemia of CKD -monitor hgb  5. HTN Continue carvedilol, hydralazine, irbesartan for blood pressure control.  D/w family  Case discussed with Care Management/Social Worker. Management plans discussed with the patient, family and they are in agreement.  CODE STATUS: full  DVT Prophylaxis: SCD and heparin till Tuesday night  TOTAL TIME TAKING CARE OF THIS PATIENT: **30* minutes.  >50% time spent on counselling and coordination of care  POSSIBLE D/C IN 2-3 DAYS, DEPENDING ON CLINICAL CONDITION.  Note: This dictation was prepared with Dragon dictation along with smaller phrase technology. Any transcriptional errors that result from this process are unintentional.  [name] M.D on 08/13/2018 at 11:50 AM  Between 7am to 6pm - Pager - [phone]  After 6pm go to [email or web address] - Proofreader  [facility]  Office  [phone]  CC: Primary care physician; Hortencia Pilar, MDPatient ID: Dawn Bradford, female   DOB: November 14, 1935, 82 y.o.   MRN: [id number]

## 2018-08-14 LAB — GLUCOSE, CAPILLARY
Glucose-Capillary: 112 mg/dL — ABNORMAL HIGH (ref 70–99)
Glucose-Capillary: 178 mg/dL — ABNORMAL HIGH (ref 70–99)
Glucose-Capillary: 148 mg/dL — ABNORMAL HIGH (ref 70–99)
Glucose-Capillary: 157 mg/dL — ABNORMAL HIGH (ref 70–99)

## 2018-08-14 LAB — PARATHYROID HORMONE, INTACT (NO CA): PTH: 59 pg/mL (ref 15–65)

## 2018-08-14 MED ORDER — ALBUTEROL SULFATE (2.5 MG/3ML) 0.083% IN NEBU
2.5000 mg | INHALATION_SOLUTION | Freq: Once | RESPIRATORY_TRACT | Status: AC
  Administered 2018-08-14: 2.5 mg via RESPIRATORY_TRACT
  Filled 2018-08-14: qty 3

## 2018-08-14 NOTE — Progress Notes (Signed)
Clinical Social Worker (CSW) met with patient and her caregiver Elmyra Ricks was at bedside. CSW presented bed offers and discussed quality measures. CSW contacted patient's daughter [name] and presented bed offers. Daughter chose [facility] and is aware that patient will have a $40 payment for round trip for dialysis transportation. Per [facility] admissions coordinator at [facility] patient will have a private room. CSW will continue to follow and assist as needed.   [employer], LCSW [phone]

## 2018-08-14 NOTE — Progress Notes (Signed)
Dawn Bradford at [place] NAME: Dawn Bradford    MR#:  [id number]  DATE OF BIRTH:  1936-07-05  SUBJECTIVE:   Planning spinal anesthesia. plavix last dose friday No new issues per RN overall patient doing okay. Husband in the room. REVIEW OF SYSTEMS:   Review of Systems  Constitutional: Negative for chills, fever and weight loss.  HENT: Negative for ear discharge, ear pain and nosebleeds.   Eyes: Negative for blurred vision, pain and discharge.  Respiratory: Negative for sputum production, shortness of breath, wheezing and stridor.   Cardiovascular: Negative for chest pain, palpitations, orthopnea and PND.  Gastrointestinal: Negative for abdominal pain, diarrhea, nausea and vomiting.  Genitourinary: Negative for frequency and urgency.  Musculoskeletal: Positive for joint pain. Negative for back pain.  Neurological: Negative for sensory change, speech change, focal weakness and weakness.  Psychiatric/Behavioral: Negative for depression and hallucinations. The patient is not nervous/anxious.    Tolerating Diet:yes Tolerating PT: pending surgery  DRUG ALLERGIES:   Allergies  Allergen Reactions  . Ace Inhibitors Other (See Comments)    Reaction:  Unknown   . Amlodipine Other (See Comments)    Reaction:  Unknown   . Clonidine Hydrochloride Other (See Comments)    Reaction:  Unknown   . Codeine Hives  . Latex Hives  . Metformin Nausea And Vomiting  . Tape Rash    VITALS:  Blood pressure (!) 137/49, pulse 81, temperature 98.5 F (36.9 C), temperature source Oral, resp. rate 18, height 5\' 3"  (1.6 m), weight 107.2 kg, last menstrual period 02/01/1986, SpO2 96 %.  PHYSICAL EXAMINATION:   Physical Exam  GENERAL:  82 y.o.-year-old patient lying in the bed with no acute distress.  EYES: Pupils equal, round, reactive to light and accommodation. No scleral icterus. Extraocular muscles intact.  HEENT: Head atraumatic, normocephalic.  Oropharynx and nasopharynx clear.  NECK:  Supple, no jugular venous distention. No thyroid enlargement, no tenderness.  LUNGS: Normal breath sounds bilaterally, no wheezing, rales, rhonchi. No use of accessory muscles of respiration.  CARDIOVASCULAR: S1, S2 normal. No murmurs, rubs, or gallops.  ABDOMEN: Soft, nontender, nondistended. Bowel sounds present. No organomegaly or mass.  EXTREMITIES: No cyanosis, clubbing or edema b/l.   Right hip deformity due to fracture NEUROLOGIC: Cranial nerves II through XII are intact. No focal Motor or sensory deficits b/l.   PSYCHIATRIC:  patient is alert and oriented x 3.  SKIN: No obvious rash, lesion, or ulcer.   LABORATORY PANEL:  CBC Recent Labs  Lab 08/12/18 0357  WBC 8.9  HGB 8.7*  HCT 26.6*  PLT 99*    Chemistries  Recent Labs  Lab 08/11/18 0057  08/13/18 0333  NA 133*   < > 132*  K 3.6   < > 3.8  CL 99   < > 97*  CO2 26   < > 28  GLUCOSE 147*   < > 132*  BUN 16   < > 37*  CREATININE 2.20*   < > 4.39*  CALCIUM 8.1*   < > 8.3*  MG 1.8  --   --   AST 25  --   --   ALT 14  --   --   ALKPHOS 50  --   --   BILITOT 0.8  --   --    < > = values in this interval not displayed.   Cardiac Enzymes No results for input(s): TROPONINI in the last 168 hours. RADIOLOGY:  No  results found. ASSESSMENT AND PLAN:  Dawn Bradford is a 82 y.o. female with medical history as listed below which notably includes end-stage renal disease on hemodialysis, history of CAD with a prior MI on Plavix and aspirin, sleep apnea, and at least some degree of chronic CHF.  She presents by EMS for evaluation of right hip pain that was acute in onset and severe. She fell coming out of the BR.  1. Acute right hip fracture s/p mechanical fall at home -Appreciate Dr Harlow Mares input--for surgery now under Spinal on wednesday-prn pain meds -d/c IVF -npo after midnight on tuesday  2.h/o CAD and Chronic diastolic CHF -seen by Dr End-- high perioperative risk. No further  work up recommended. Pt best at baseline -cont cardiac meds  3/ESRD on HD -seen by Dr Juleen China -patient to get hemodialysis after surgery tomorrow.  4. Anemia of CKD -monitor hgb  5. HTN Continue carvedilol, hydralazine, irbesartan for blood pressure control.  D/w family-- been in the room  Case discussed with Care Management/Social Worker. Management plans discussed with the patient, family and they are in agreement.  CODE STATUS: full  DVT Prophylaxis: SCD and heparin till Tuesday night  TOTAL TIME TAKING CARE OF THIS PATIENT: *30* minutes.  >50% time spent on counselling and coordination of care  POSSIBLE D/C IN 2-3 DAYS, DEPENDING ON CLINICAL CONDITION.  Note: This dictation was prepared with Dragon dictation along with smaller phrase technology. Any transcriptional errors that result from this process are unintentional.  [name] M.D on 08/14/2018 at 3:49 PM  Between 7am to 6pm - Pager - [phone]  After 6pm go to [email or web address] - Proofreader  [facility]  Office  [phone]  CC: Primary care physician; Hortencia Pilar, MDPatient ID: Dawn Bradford, female   DOB: 10-28-1935, 82 y.o.   MRN: [id number]

## 2018-08-14 NOTE — Care Management Important Message (Signed)
Important Message  Patient Details  Name: Dawn Bradford MRN: [id number] Date of Birth: [birth year]   Medicare Important Message Given:  Yes    [name] 08/14/2018, 10:34 AM

## 2018-08-14 NOTE — Progress Notes (Signed)
  Subjective:  Patient reports pain as mild.    Objective:   VITALS:   Vitals:   08/13/18 1555 08/13/18 1620 08/14/18 0008 08/14/18 0756  BP: (!) 144/118 (!) 155/59 (!) 127/42 (!) 137/49  Pulse: 99 98 73 81  Resp: (!) 21  18   Temp:   (!) 97.2 F (36.2 C) 98.5 F (36.9 C)  TempSrc:    Oral  SpO2:  98% 95% 96%  Weight:      Height:        PHYSICAL EXAM:  Sensation intact distally Dorsiflexion/Plantar flexion intact Compartment soft  LABS  Results for orders placed or performed during the hospital encounter of 08/10/18 (from the past 24 hour(s))  Phosphorus     Status: None   Collection Time: 08/13/18 12:06 PM  Result Value Ref Range   Phosphorus 4.1 2.5 - 4.6 mg/dL  Parathyroid hormone, intact (no Ca)     Status: None   Collection Time: 08/13/18 12:06 PM  Result Value Ref Range   PTH 59 15 - 65 pg/mL  Glucose, capillary     Status: Abnormal   Collection Time: 08/13/18  4:19 PM  Result Value Ref Range   Glucose-Capillary 101 (H) 70 - 99 mg/dL  Glucose, capillary     Status: Abnormal   Collection Time: 08/13/18  8:59 PM  Result Value Ref Range   Glucose-Capillary 127 (H) 70 - 99 mg/dL   Comment 1 Notify RN   Glucose, capillary     Status: Abnormal   Collection Time: 08/14/18  7:50 AM  Result Value Ref Range   Glucose-Capillary 112 (H) 70 - 99 mg/dL    No results found.  Assessment/Plan: * Surgery Date in Future *   Active Problems:   Closed intertrochanteric fracture of hip, right, initial encounter ([place])   Plan for femoral nail tomorrow afternoon. NPO after midnight. HD per primary team.   Lovell Sheehan , MD 08/14/2018, 8:33 AM

## 2018-08-14 NOTE — Progress Notes (Signed)
[facility]  ROUNDING NOTE   Subjective:   Hemodialysis treatment yesterday. Tolerated treatment well. UF of  1.4 liters  Daughter at bedside.   Objective:  Vital signs in last 24 hours:  Temp:  [97.2 F (36.2 C)-98.5 F (36.9 C)] 98.5 F (36.9 C) (12/10 0756) Pulse Rate:  [73-99] 81 (12/10 0756) Resp:  [16-21] 18 (12/10 0008) BP: (127-159)/(42-118) 137/49 (12/10 0756) SpO2:  [95 %-99 %] 96 % (12/10 0756) Weight:  [107.2 kg] 107.2 kg (12/09 1545)  Weight change:  Filed Weights   08/11/18 0009 08/13/18 1200 08/13/18 1545  Weight: 103.3 kg 108 kg 107.2 kg    Intake/Output: I/O last 3 completed shifts: In: 360 [P.O.:360] Out: 1424 [Other:1424]   Intake/Output this shift:  Total I/O In: 240 [P.O.:240] Out: 250 [Urine:250]  Physical Exam: General: No acute distress  Head: Normocephalic, atraumatic. Moist oral mucosal membranes  Eyes: Anicteric  Neck: Supple, trachea midline  Lungs:  Clear to auscultation, normal effort  Heart: S1S2 no rubs  Abdomen:  Soft, nontender, bowel sounds present  Extremities: No peripheral edema.  Neurologic: Awake, alert, following commands  Skin: No lesions  Access: LUE AVF    Basic Metabolic Panel: Recent Labs  Lab 08/11/18 0057 08/12/18 0357 08/13/18 0333 08/13/18 1206  NA 133* 134* 132*  --   K 3.6 4.1 3.8  --   CL 99 98 97*  --   CO2 26 29 28   --   GLUCOSE 147* 131* 132*  --   BUN 16 27* 37*  --   CREATININE 2.20* 3.83* 4.39*  --   CALCIUM 8.1* 8.5* 8.3*  --   MG 1.8  --   --   --   PHOS 2.7  --   --  4.1    Liver Function Tests: Recent Labs  Lab 08/11/18 0057  AST 25  ALT 14  ALKPHOS 50  BILITOT 0.8  PROT 5.6*  ALBUMIN 3.3*   No results for input(s): LIPASE, AMYLASE in the last 168 hours. No results for input(s): AMMONIA in the last 168 hours.  CBC: Recent Labs  Lab 08/11/18 0057 08/12/18 0357  WBC 10.2 8.9  NEUTROABS 8.0*  --   HGB 9.8* 8.7*  HCT 29.3* 26.6*  MCV 107.3* 109.0*  PLT  90* 99*    Cardiac Enzymes: No results for input(s): CKTOTAL, CKMB, CKMBINDEX, TROPONINI in the last 168 hours.  BNP: Invalid input(s): POCBNP  CBG: Recent Labs  Lab 08/13/18 0749 08/13/18 1619 08/13/18 2059 08/14/18 0750 08/14/18 1147  GLUCAP 115* 101* 127* 112* 178*    Microbiology: Results for orders placed or performed during the hospital encounter of 10/11/17  MRSA PCR Screening     Status: None   Collection Time: 10/14/17  5:30 PM  Result Value Ref Range Status   MRSA by PCR NEGATIVE NEGATIVE Final    Comment:        The GeneXpert MRSA Assay (FDA approved for NASAL specimens only), is one component of a comprehensive MRSA colonization surveillance program. It is not intended to diagnose MRSA infection nor to guide or monitor treatment for MRSA infections. Performed at [facility], [address] [place], [place] [address]     Coagulation Studies: No results for input(s): LABPROT, INR in the last 72 hours.  Urinalysis: No results for input(s): COLORURINE, LABSPEC, PHURINE, GLUCOSEU, HGBUR, BILIRUBINUR, KETONESUR, PROTEINUR, UROBILINOGEN, NITRITE, LEUKOCYTESUR in the last 72 hours.  Invalid input(s): APPERANCEUR    Imaging: No results found.  Medications:   .  ceFAZolin (ANCEF) IV     . aspirin EC  81 mg Oral Daily  . carvedilol  25 mg Oral Q M,W,F-1800  . Chlorhexidine Gluconate Cloth  6 each Topical Q0600  . cholecalciferol  5,000 Units Oral Daily  . epoetin (EPOGEN/PROCRIT) injection  10,000 Units Intravenous Q M,W,F-HD  . Ferrous Fumarate  1 tablet Oral Daily   And  . iron polysaccharides  150 mg Oral Daily  . fluconazole  150 mg Oral Once  . FLUoxetine  20 mg Oral Daily  . heparin injection (subcutaneous)  5,000 Units Subcutaneous Q8H  . hydrALAZINE  50 mg Oral BID  . insulin aspart  0-15 Units Subcutaneous TID WC  . insulin aspart  0-5 Units Subcutaneous QHS  . irbesartan  300 mg Oral Daily  . isosorbide mononitrate  30 mg  Oral QHS  . levothyroxine  75 mcg Oral QAC breakfast  . rosuvastatin  40 mg Oral QPM  . tranexamic acid (CYKLOKAPRON) topical -INTRAOP  2,000 mg Topical Once  . traZODone  50 mg Oral QHS   acetaminophen **OR** acetaminophen, albuterol, bisacodyl, HYDROmorphone (DILAUDID) injection, ondansetron **OR** ondansetron (ZOFRAN) IV, senna-docusate, traMADol  Assessment/ Plan:  82 y.o. female with end-stage renal disease, coronary disease, carotid stenosis, history of stroke, diabetes, CABG, obstructive sleep apnea, history of renal artery stenosis, admitfed for right hip fracture 08/10/18.  [facility]/MWF/[facility]/99.5  1.  ESRD on HD MWF. Hemodialysis treatment yesterday.  Hemodialysis treatment scheduled for tomorrow after surgery  2.  Anemia of chronic kidney disease.   Hemoglobin 8.7 - EPO with HD treatment  3.  Hypertension: blood pressure at goal - carvedilol, hydralazine, irbesartan.  4. Secondary Hyperparathyroidism: PTH low at 100. Outpatient phosphorus and calcium at goal.  - not currently on binders.    LOS: 3 Mirenda Baltazar 12/10/20193:16 PM

## 2018-08-14 NOTE — Progress Notes (Signed)
RT to patient bedside for 1 time order Albuterol Breathing Treatment. Patient found off of her CPAP with 2L O2 bleed in, Patient SAT at 83% on room air and experiencing shortness of breath. Patient states she does not want to wear CPAP at all. Patient placed back on 2l [place] SAT at 94%. Albuterol treatment given. Patient has diminished breath sounds with upper airway wheezes. RN aware. Will continue to monitor.

## 2018-08-14 NOTE — Progress Notes (Signed)
Patient c/o of vaginal irritation and discomfort, stating "my lady parts hurt". Thinks she has yeast infection and told family she thinks she has cystitis. MD notified. Medication ordered. After discussion with daughter. Declined diflucan and would like urine tested if discomfort still present in morning. No yeast visible. Patient does have moisture damage due to urination. Cleansed as needed and external catheter placed.Medicated with tylenol as needed. Will monitor.

## 2018-08-15 LAB — CBC
HCT: 20.7 % — ABNORMAL LOW (ref 36.0–46.0)
Hemoglobin: 7.1 g/dL — ABNORMAL LOW (ref 12.0–15.0)
MCH: 36.6 pg — AB (ref 26.0–34.0)
MCHC: 34.3 g/dL (ref 30.0–36.0)
MCV: 106.7 fL — ABNORMAL HIGH (ref 80.0–100.0)
Platelets: 128 10*3/uL — ABNORMAL LOW (ref 150–400)
RBC: 1.94 MIL/uL — ABNORMAL LOW (ref 3.87–5.11)
RDW: 13.2 % (ref 11.5–15.5)
WBC: 8.7 10*3/uL (ref 4.0–10.5)
nRBC: 0 % (ref 0.0–0.2)

## 2018-08-15 LAB — GLUCOSE, CAPILLARY
GLUCOSE-CAPILLARY: 120 mg/dL — AB (ref 70–99)
Glucose-Capillary: 137 mg/dL — ABNORMAL HIGH (ref 70–99)
Glucose-Capillary: 117 mg/dL — ABNORMAL HIGH (ref 70–99)
Glucose-Capillary: 146 mg/dL — ABNORMAL HIGH (ref 70–99)

## 2018-08-15 LAB — BASIC METABOLIC PANEL
Anion gap: 7 (ref 5–15)
BUN: 37 mg/dL — ABNORMAL HIGH (ref 8–23)
CO2: 29 mmol/L (ref 22–32)
Calcium: 8.4 mg/dL — ABNORMAL LOW (ref 8.9–10.3)
Chloride: 96 mmol/L — ABNORMAL LOW (ref 98–111)
Creatinine, Ser: 3.31 mg/dL — ABNORMAL HIGH (ref 0.44–1.00)
GFR calc Af Amer: 14 mL/min — ABNORMAL LOW (ref >60)
GFR calc non Af Amer: 13 mL/min — ABNORMAL LOW (ref >60)
GLUCOSE: 144 mg/dL — AB (ref 70–99)
Potassium: 3.5 mmol/L (ref 3.5–5.1)
Sodium: 132 mmol/L — ABNORMAL LOW (ref 135–145)

## 2018-08-15 LAB — ALBUMIN: Albumin: 2.6 g/dL — ABNORMAL LOW (ref 3.5–5.0)

## 2018-08-15 LAB — PROTIME-INR
INR: 1.16
Prothrombin Time: 14.7 seconds (ref 11.4–15.2)

## 2018-08-15 LAB — PHOSPHORUS: PHOSPHORUS: 2.5 mg/dL (ref 2.5–4.6)

## 2018-08-15 LAB — PREPARE RBC (CROSSMATCH)

## 2018-08-15 LAB — APTT: APTT: 44 s — AB (ref 24–36)

## 2018-08-15 MED ORDER — PROPOFOL 10 MG/ML IV BOLUS
INTRAVENOUS | Status: AC
  Filled 2018-08-15: qty 20

## 2018-08-15 MED ORDER — FENTANYL CITRATE (PF) 100 MCG/2ML IJ SOLN
INTRAMUSCULAR | Status: AC
  Filled 2018-08-15: qty 2

## 2018-08-15 MED ORDER — SODIUM CHLORIDE 0.9% IV SOLUTION: Freq: Once | INTRAVENOUS | Status: DC

## 2018-08-15 MED ORDER — ROSUVASTATIN CALCIUM 10 MG PO TABS
10.0000 mg | ORAL_TABLET | Freq: Every evening | ORAL | Status: DC
  Administered 2018-08-15 – 2018-08-20 (×5): 10 mg via ORAL
  Filled 2018-08-15 (×5): qty 1

## 2018-08-15 MED ORDER — SODIUM CHLORIDE 0.9 % IV SOLN
INTRAVENOUS | Status: DC
  Administered 2018-08-17: 14:00:00 via INTRAVENOUS

## 2018-08-15 NOTE — Progress Notes (Signed)
Per patient's daughter [name] patient has her own cpap that family can bring to [place]. [facility] admissions coordinator at [facility] is aware of above.   [employer], LCSW [phone]

## 2018-08-15 NOTE — Progress Notes (Signed)
Pre HD assessment    08/15/18 1446  Neurological  Level of Consciousness Alert  Orientation Level Oriented to person;Other (comment) (very confused)  Respiratory  Respiratory Pattern Regular;Unlabored  Chest Assessment Chest expansion symmetrical  Cardiac  Pulse Irregular  ECG Monitor Yes  Vascular  R Radial Pulse +2  L Radial Pulse +2  Integumentary  Integumentary (WDL) X  Skin Color Appropriate for ethnicity  Musculoskeletal  Musculoskeletal (WDL) X  Generalized Weakness Yes  Assistive Device None  GU Assessment  Genitourinary (WDL) X  Genitourinary Symptoms  (HD)  Psychosocial  Psychosocial (WDL) X  Patient Behaviors Cooperative;Tearful;Other (Comment) (confused )

## 2018-08-15 NOTE — Progress Notes (Signed)
HD tx end    08/15/18 1802  Vital Signs  Pulse Rate 92  Pulse Rate Source Monitor  Resp 18  BP (!) 173/58  BP Location Right Wrist  BP Method Automatic  Patient Position (if appropriate) Lying  Oxygen Therapy  SpO2 100 %  O2 Device Nasal Cannula  O2 Flow Rate (L/min) 2 L/min  During Hemodialysis Assessment  Dialysis Fluid Bolus Normal Saline  Bolus Amount (mL) 250 mL  Intra-Hemodialysis Comments Tx completed

## 2018-08-15 NOTE — Progress Notes (Signed)
Post HD assessment    08/15/18 1808  Neurological  Level of Consciousness Alert  Orientation Level Oriented to person;Other (comment) (very confused)  Respiratory  Respiratory Pattern Regular;Unlabored  Chest Assessment Chest expansion symmetrical  Cardiac  Pulse Irregular  ECG Monitor Yes  Vascular  R Radial Pulse +2  L Radial Pulse +2  Integumentary  Integumentary (WDL) X  Skin Color Appropriate for ethnicity  Musculoskeletal  Musculoskeletal (WDL) X  Generalized Weakness Yes  Assistive Device None  GU Assessment  Genitourinary (WDL) X  Genitourinary Symptoms  (HD)  Psychosocial  Psychosocial (WDL) X  Patient Behaviors Cooperative;Calm;Tearful

## 2018-08-15 NOTE — Progress Notes (Signed)
Blood administration end. 1 unit of blood given during HD tx per MD orders.    08/15/18 1715  Vital Signs  Temp 98.3 F (36.8 C)  Temp Source Oral  Pulse Rate 82  Pulse Rate Source Monitor  Resp 18  BP (!) 154/61  BP Location Right Wrist  BP Method Automatic  Patient Position (if appropriate) Lying  Oxygen Therapy  SpO2 99 %  O2 Device Nasal Cannula  O2 Flow Rate (L/min) 2 L/min  During Hemodialysis Assessment  Blood Flow Rate (mL/min) 400 mL/min  Arterial Pressure (mmHg) -180 mmHg  Venous Pressure (mmHg) 160 mmHg  Transmembrane Pressure (mmHg) 70 mmHg  Ultrafiltration Rate (mL/min) 760 mL/min  Dialysate Flow Rate (mL/min) 600 ml/min  Conductivity: Machine  13.7  HD Safety Checks Performed Yes  Intra-Hemodialysis Comments Progressing as prescribed (1449)

## 2018-08-15 NOTE — Progress Notes (Signed)
HD tx start    08/15/18 1454  Vital Signs  Pulse Rate 92  Pulse Rate Source Monitor  Resp 18  BP (!) 160/67  BP Location Right Wrist  BP Method Automatic  Patient Position (if appropriate) Lying  Oxygen Therapy  SpO2 100 %  O2 Device Nasal Cannula  O2 Flow Rate (L/min) 2 L/min  During Hemodialysis Assessment  Blood Flow Rate (mL/min) 400 mL/min  Arterial Pressure (mmHg) -150 mmHg  Venous Pressure (mmHg) 130 mmHg  Transmembrane Pressure (mmHg) 60 mmHg  Ultrafiltration Rate (mL/min) 500 mL/min  Dialysate Flow Rate (mL/min) 600 ml/min  Conductivity: Machine  13.8  HD Safety Checks Performed Yes  Dialysis Fluid Bolus Normal Saline  Bolus Amount (mL) 250 mL  Intra-Hemodialysis Comments Tx initiated  Fistula / Graft Left Forearm Arteriovenous fistula  No Placement Date or Time found.   Placed prior to admission: Yes  Orientation: Left  Access Location: Forearm  Access Type: Arteriovenous fistula  Status Accessed  Needle Size 15

## 2018-08-15 NOTE — Progress Notes (Signed)
[place] [facility] [place] NAME: Dawn Bradford    MR#:  [id number]  DATE OF BIRTH:  07/20/1936  SUBJECTIVE:   some confusion last pm. Had transient hypoxia Resting comfortably at presnt. No family in the room currently REVIEW OF SYSTEMS:   Review of Systems  Constitutional: Negative for chills, fever and weight loss.  HENT: Negative for ear discharge, ear pain and nosebleeds.   Eyes: Negative for blurred vision, pain and discharge.  Respiratory: Negative for sputum production, shortness of breath, wheezing and stridor.   Cardiovascular: Negative for chest pain, palpitations, orthopnea and PND.  Gastrointestinal: Negative for abdominal pain, diarrhea, nausea and vomiting.  Genitourinary: Negative for frequency and urgency.  Musculoskeletal: Positive for joint pain. Negative for back pain.  Neurological: Negative for sensory change, speech change, focal weakness and weakness.  Psychiatric/Behavioral: Negative for depression and hallucinations. The patient is not nervous/anxious.    Tolerating Diet:yes Tolerating PT: pending surgery  DRUG ALLERGIES:   Allergies  Allergen Reactions  . Ace Inhibitors Other (See Comments)    Reaction:  Unknown   . Amlodipine Other (See Comments)    Reaction:  Unknown   . Clonidine Hydrochloride Other (See Comments)    Reaction:  Unknown   . Codeine Hives  . Latex Hives  . Metformin Nausea And Vomiting  . Tape Rash    VITALS:  Blood pressure (!) 160/61, pulse 86, temperature 98.3 F (36.8 C), resp. rate 20, height 5\' 3"  (1.6 m), weight 107 kg, last menstrual period 02/01/1986, SpO2 99 %.  PHYSICAL EXAMINATION:   Physical Exam  GENERAL:  82 y.o.-year-old patient lying in the bed with no acute distress.  EYES: Pupils equal, round, reactive to light and accommodation. No scleral icterus. Extraocular muscles intact.  HEENT: Head atraumatic, normocephalic. Oropharynx and nasopharynx clear.  NECK:   Supple, no jugular venous distention. No thyroid enlargement, no tenderness.  LUNGS: Normal breath sounds bilaterally, no wheezing, rales, rhonchi. No use of accessory muscles of respiration.  CARDIOVASCULAR: S1, S2 normal. No murmurs, rubs, or gallops.  ABDOMEN: Soft, nontender, nondistended. Bowel sounds present. No organomegaly or mass.  EXTREMITIES: No cyanosis, clubbing or edema b/l.   Right hip deformity due to fracture NEUROLOGIC: Cranial nerves II through XII are intact. No focal Motor or sensory deficits b/l.   PSYCHIATRIC:  patient is resting/sleeping  SKIN: No obvious rash, lesion, or ulcer.   LABORATORY PANEL:  CBC Recent Labs  Lab 08/12/18 0357  WBC 8.9  HGB 8.7*  HCT 26.6*  PLT 99*    Chemistries  Recent Labs  Lab 08/11/18 0057  08/13/18 0333  NA 133*   < > 132*  K 3.6   < > 3.8  CL 99   < > 97*  CO2 26   < > 28  GLUCOSE 147*   < > 132*  BUN 16   < > 37*  CREATININE 2.20*   < > 4.39*  CALCIUM 8.1*   < > 8.3*  MG 1.8  --   --   AST 25  --   --   ALT 14  --   --   ALKPHOS 50  --   --   BILITOT 0.8  --   --    < > = values in this interval not displayed.   Cardiac Enzymes No results for input(s): TROPONINI in the last 168 hours. RADIOLOGY:  No results found. ASSESSMENT AND PLAN:  Dawn Bradford is  a 82 y.o. female with medical history as listed below which notably includes end-stage renal disease on hemodialysis, history of CAD with a prior MI on Plavix and aspirin, sleep apnea, and at least some degree of chronic CHF.  She presents by EMS for evaluation of right hip pain that was acute in onset and severe. She fell coming out of the BR.  1. Acute right hip fracture s/p mechanical fall at home -Appreciate Dr Harlow Mares input--for surgery now under Spinal on wednesday-prn pain meds -d/c IVF -for hip surgery today  2.h/o CAD and Chronic diastolic CHF -seen by Dr End-- high perioperative risk. No further work up recommended. Pt best at baseline -cont cardiac  meds  3/ESRD on HD -seen by Dr Juleen China -patient to get hemodialysis after surgery tomorrow.  4. Anemia of CKD -monitor hgb  5. HTN Continue carvedilol, hydralazine, irbesartan for blood pressure control.  D/w family-- been in the room  Case discussed with Care Management/Social Worker. Management plans discussed with the patient, family and they are in agreement.  CODE STATUS: full  DVT Prophylaxis: SCD and heparin till Tuesday night  TOTAL TIME TAKING CARE OF THIS PATIENT: *30* minutes.  >50% time spent on counselling and coordination of care  POSSIBLE D/C IN 2-3 DAYS, DEPENDING ON CLINICAL CONDITION.  Note: This dictation was prepared with Dragon dictation along with smaller phrase technology. Any transcriptional errors that result from this process are unintentional.  [name] M.D on 08/15/2018 at 8:41 AM  Between 7am to 6pm - Pager - [phone]  After 6pm go to [email or web address] - Proofreader  [facility]  Office  [phone]  CC: Primary care physician; Hortencia Pilar, MDPatient ID: Dawn Bradford, female   DOB: [birth year], 82 y.o.   MRN: [id number]

## 2018-08-15 NOTE — Progress Notes (Signed)
cpap refused 

## 2018-08-15 NOTE — Progress Notes (Signed)
Blood administration start    08/15/18 1622  Vitals  Temp 98.4 F (36.9 C)  Temp Source Oral  Pulse Rate 82  ECG Heart Rate 82  Resp 15  BP (!) 162/53  Oxygen Therapy  SpO2 100 %  O2 Device Nasal Cannula  O2 Flow Rate (L/min) 2 L/min

## 2018-08-15 NOTE — Progress Notes (Signed)
[facility]  ROUNDING NOTE   Subjective:   Seen and examined on hemodialysis. Tolerating treatment well. 1 unit PRBC transfusion.   Hip surgery has been postponed.   Objective:  Vital signs in last 24 hours:  Temp:  [97.4 F (36.3 C)-98.7 F (37.1 C)] 98.3 F (36.8 C) (12/11 1715) Pulse Rate:  [77-96] 77 (12/11 1745) Resp:  [15-34] 18 (12/11 1745) BP: (132-167)/(45-87) 161/87 (12/11 1745) SpO2:  [94 %-100 %] 100 % (12/11 1745) Weight:  [107 kg-122.5 kg] 122.5 kg (12/11 1445)  Weight change: -0.956 kg Filed Weights   08/13/18 1545 08/15/18 0500 08/15/18 1445  Weight: 107.2 kg 107 kg 122.5 kg    Intake/Output: I/O last 3 completed shifts: In: 240 [P.O.:240] Out: 550 [Urine:550]   Intake/Output this shift:  Total I/O In: 350 [Blood:350] Out: -   Physical Exam: General: No acute distress  Head: Normocephalic, atraumatic. Moist oral mucosal membranes  Eyes: Anicteric  Neck: Supple, trachea midline  Lungs:  Clear to auscultation, normal effort  Heart: S1S2 no rubs  Abdomen:  Soft, nontender, bowel sounds present  Extremities: No peripheral edema.  Neurologic: Awake, alert, following commands  Skin: No lesions  Access: LUE AVF    Basic Metabolic Panel: Recent Labs  Lab 08/11/18 0057 08/12/18 0357 08/13/18 0333 08/13/18 1206 08/15/18 0959 08/15/18 1300  NA 133* 134* 132*  --  132*  --   K 3.6 4.1 3.8  --  3.5  --   CL 99 98 97*  --  96*  --   CO2 26 29 28   --  29  --   GLUCOSE 147* 131* 132*  --  144*  --   BUN 16 27* 37*  --  37*  --   CREATININE 2.20* 3.83* 4.39*  --  3.31*  --   CALCIUM 8.1* 8.5* 8.3*  --  8.4*  --   MG 1.8  --   --   --   --   --   PHOS 2.7  --   --  4.1  --  2.5    Liver Function Tests: Recent Labs  Lab 08/11/18 0057 08/15/18 1300  AST 25  --   ALT 14  --   ALKPHOS 50  --   BILITOT 0.8  --   PROT 5.6*  --   ALBUMIN 3.3* 2.6*   No results for input(s): LIPASE, AMYLASE in the last 168 hours. No results for  input(s): AMMONIA in the last 168 hours.  CBC: Recent Labs  Lab 08/11/18 0057 08/12/18 0357 08/15/18 1003  WBC 10.2 8.9 8.7  NEUTROABS 8.0*  --   --   HGB 9.8* 8.7* 7.1*  HCT 29.3* 26.6* 20.7*  MCV 107.3* 109.0* 106.7*  PLT 90* 99* 128*    Cardiac Enzymes: No results for input(s): CKTOTAL, CKMB, CKMBINDEX, TROPONINI in the last 168 hours.  BNP: Invalid input(s): POCBNP  CBG: Recent Labs  Lab 08/14/18 1147 08/14/18 1657 08/14/18 2117 08/15/18 0755 08/15/18 1149  GLUCAP 178* 148* 157* 137* 120*    Microbiology: Results for orders placed or performed during the hospital encounter of 10/11/17  MRSA PCR Screening     Status: None   Collection Time: 10/14/17  5:30 PM  Result Value Ref Range Status   MRSA by PCR NEGATIVE NEGATIVE Final    Comment:        The GeneXpert MRSA Assay (FDA approved for NASAL specimens only), is one component of a comprehensive MRSA colonization surveillance program.  It is not intended to diagnose MRSA infection nor to guide or monitor treatment for MRSA infections. Performed at [facility], [address] [place], [place] [address]     Coagulation Studies: Recent Labs    08/15/18 0959  LABPROT 14.7  INR 1.16    Urinalysis: No results for input(s): COLORURINE, LABSPEC, PHURINE, GLUCOSEU, HGBUR, BILIRUBINUR, KETONESUR, PROTEINUR, UROBILINOGEN, NITRITE, LEUKOCYTESUR in the last 72 hours.  Invalid input(s): APPERANCEUR    Imaging: No results found.   Medications:   . sodium chloride    .  ceFAZolin (ANCEF) IV     . sodium chloride   Intravenous Once  . aspirin EC  81 mg Oral Daily  . carvedilol  25 mg Oral Q M,W,F-1800  . Chlorhexidine Gluconate Cloth  6 each Topical Q0600  . cholecalciferol  5,000 Units Oral Daily  . epoetin (EPOGEN/PROCRIT) injection  10,000 Units Intravenous Q M,W,F-HD  . Ferrous Fumarate  1 tablet Oral Daily   And  . iron polysaccharides  150 mg Oral Daily  . fluconazole  150 mg Oral  Once  . FLUoxetine  20 mg Oral Daily  . hydrALAZINE  50 mg Oral BID  . insulin aspart  0-15 Units Subcutaneous TID WC  . insulin aspart  0-5 Units Subcutaneous QHS  . irbesartan  300 mg Oral Daily  . isosorbide mononitrate  30 mg Oral QHS  . levothyroxine  75 mcg Oral QAC breakfast  . rosuvastatin  10 mg Oral QPM  . tranexamic acid (CYKLOKAPRON) topical -INTRAOP  2,000 mg Topical Once  . traZODone  50 mg Oral QHS   acetaminophen **OR** acetaminophen, albuterol, bisacodyl, HYDROmorphone (DILAUDID) injection, ondansetron **OR** ondansetron (ZOFRAN) IV, senna-docusate, traMADol  Assessment/ Plan:  82 y.o. female with end-stage renal disease, coronary disease, carotid stenosis, history of stroke, diabetes, CABG, obstructive sleep apnea, history of renal artery stenosis, admitfed for right hip fracture 08/10/18.  [facility]/MWF/[facility]/99.5  1.  ESRD on HD MWF. Seen and examined on hemodialysis treatment.   2.  Anemia of chronic kidney disease.   Hemoglobin 7.1 - EPO with HD treatment - PRBC transfusion  3.  Hypertension: blood pressure at goal - carvedilol, hydralazine, irbesartan.  4. Secondary Hyperparathyroidism: PTH low at 100. Outpatient phosphorus and calcium at goal.  - not currently on binders.    LOS: 4 Shambria Camerer 12/11/20196:00 PM

## 2018-08-15 NOTE — Progress Notes (Signed)
Pre HD assessment     08/15/18 1445  Vital Signs  Temp 98.7 F (37.1 C)  Temp Source Oral  Pulse Rate 95  Pulse Rate Source Monitor  Resp (!) 22  BP (!) 148/61  BP Location Right Wrist  BP Method Automatic  Patient Position (if appropriate) Lying  Oxygen Therapy  SpO2 100 %  O2 Device Nasal Cannula  O2 Flow Rate (L/min) 2 L/min  Pain Assessment  Pain Scale 0-10  Pain Score 0  Dialysis Weight  Weight 122.5 kg  Type of Weight Pre-Dialysis  Time-Out for Hemodialysis  What Procedure? HD  Pt Identifiers(min of two) First/Last Name;MRN/Account#  Correct Site? Yes  Correct Side? Yes  Correct Procedure? Yes  Consents Verified? Yes  Rad Studies Available? N/A  Safety Precautions Reviewed? Yes  Engineer, civil (consulting) Number  ([id number])  Station Number 1  UF/Alarm Test Passed  Conductivity: Meter 14  Conductivity: Machine  13.9  pH 7.6  Reverse Osmosis main  Normal Saline Lot Number [id number]  Dialyzer Lot Number [id number]  Disposable Set Lot Number [id number]  Machine Temperature 98.6 F (37 C)  Musician and Audible Yes  Blood Lines Intact and Secured Yes  Pre Treatment Patient Checks  Vascular access used during treatment Catheter  Hepatitis B Surface Antigen Results  (unk)  Date Hepatitis B Surface Antigen Drawn 08/15/18  Hepatitis B Surface Antibody  (unk)  Date Hepatitis B Surface Antibody Drawn 08/15/18  Hemodialysis Consent Verified Yes  Hemodialysis Standing Orders Initiated Yes  ECG (Telemetry) Monitor On Yes  Prime Ordered Normal Saline  Length of  DialysisTreatment -hour(s) 3 Hour(s)  Dialyzer Elisio 17H NR  Dialysate 3K, 2.5 Ca  Dialysis Anticoagulant None  Dialysate Flow Ordered 600  Blood Flow Rate Ordered 400 mL/min  Ultrafiltration Goal 1 Liters  Dialysis Blood Pressure Support Ordered Normal Saline  Education / Care Plan  Dialysis Education Provided Yes  Documented Education in Care Plan Yes  Fistula / Graft Left Forearm Arteriovenous fistula   No Placement Date or Time found.   Placed prior to admission: Yes  Orientation: Left  Access Location: Forearm  Access Type: Arteriovenous fistula  Site Condition Other (Comment) (bruised )  Fistula / Graft Assessment Present;Thrill;Bruit  Drainage Description None

## 2018-08-15 NOTE — Progress Notes (Signed)
Post HD assessment. Pt tolerated tx well without c/o or complication. Net UF 1032, goal met.    08/15/18 1811  Vital Signs  Temp 98.3 F (36.8 C)  Temp Source Oral  Pulse Rate 93  Pulse Rate Source Monitor  Resp (!) 24  BP (!) 195/77  BP Location Right Wrist  BP Method Automatic  Patient Position (if appropriate) Lying  Oxygen Therapy  SpO2 100 %  O2 Device Nasal Cannula  O2 Flow Rate (L/min) 2 L/min  Dialysis Weight  Weight 122 kg  Type of Weight Post-Dialysis  Post-Hemodialysis Assessment  Rinseback Volume (mL) 250 mL  KECN 69.4 V  Dialyzer Clearance Lightly streaked  Duration of HD Treatment -hour(s) 3 hour(s)  Hemodialysis Intake (mL) 850 mL  UF Total -Machine (mL) 1882 mL  Net UF (mL) 1032 mL  Tolerated HD Treatment Yes  AVG/AVF Arterial Site Held (minutes) 10 minutes  AVG/AVF Venous Site Held (minutes) 10 minutes  Education / Care Plan  Dialysis Education Provided Yes  Documented Education in Care Plan Yes  Fistula / Graft Left Forearm Arteriovenous fistula  No Placement Date or Time found.   Placed prior to admission: Yes  Orientation: Left  Access Location: Forearm  Access Type: Arteriovenous fistula  Site Condition No complications  Fistula / Graft Assessment Present;Thrill;Bruit  Status Deaccessed  Drainage Description None

## 2018-08-15 NOTE — Progress Notes (Signed)
Patient ID: Dawn Bradford, female   DOB: [birth year], 82 y.o.   MRN: [id number] Pt's surgery cancelled Spoke with Dr Harlow Mares. He is in [place] on Friday and can do on Saturday morning Pt to get HD and 1 unit BT at HD. hgb 7.1 Spoke with Dters. Dr Harlow Mares to inform anesthesia to discuss with family. Dr Juleen China aware. NO heparin at HD today per nephro orders

## 2018-08-16 LAB — GLUCOSE, CAPILLARY
Glucose-Capillary: 106 mg/dL — ABNORMAL HIGH (ref 70–99)
Glucose-Capillary: 112 mg/dL — ABNORMAL HIGH (ref 70–99)
Glucose-Capillary: 130 mg/dL — ABNORMAL HIGH (ref 70–99)
Glucose-Capillary: 148 mg/dL — ABNORMAL HIGH (ref 70–99)

## 2018-08-16 LAB — CBC
HCT: 27 % — ABNORMAL LOW (ref 36.0–46.0)
Hemoglobin: 9 g/dL — ABNORMAL LOW (ref 12.0–15.0)
MCH: 34.7 pg — AB (ref 26.0–34.0)
MCHC: 33.3 g/dL (ref 30.0–36.0)
MCV: 104.2 fL — ABNORMAL HIGH (ref 80.0–100.0)
Platelets: 120 10*3/uL — ABNORMAL LOW (ref 150–400)
RBC: 2.59 MIL/uL — ABNORMAL LOW (ref 3.87–5.11)
RDW: 15.6 % — ABNORMAL HIGH (ref 11.5–15.5)
WBC: 8.1 10*3/uL (ref 4.0–10.5)
nRBC: 0 % (ref 0.0–0.2)

## 2018-08-16 MED ORDER — CHLORHEXIDINE GLUCONATE 4 % EX LIQD: 1.0000 "application " | Freq: Once | CUTANEOUS | Status: DC

## 2018-08-16 MED ORDER — LACTULOSE 10 GM/15ML PO SOLN
20.0000 g | Freq: Once | ORAL | Status: AC
  Administered 2018-08-16: 20 g via ORAL
  Filled 2018-08-16: qty 30

## 2018-08-16 MED ORDER — CLINDAMYCIN PHOSPHATE 600 MG/50ML IV SOLN
600.0000 mg | INTRAVENOUS | Status: AC
  Administered 2018-08-17: 600 mg via INTRAVENOUS
  Filled 2018-08-16: qty 50

## 2018-08-16 NOTE — Progress Notes (Signed)
Pt refused cpap for tonight 

## 2018-08-16 NOTE — Progress Notes (Signed)
Per RN plan is for patient to have surgery tomorrow. Patient will D/C to [facility] when medically stable. [facility] admissions coordinator at [facility] is aware of above. Patient and her daughter [name] are aware of above.   [employer], LCSW [phone]

## 2018-08-16 NOTE — Progress Notes (Signed)
Subjective: * Surgery Date in Future * Procedure(s) (LRB): INTRAMEDULLARY (IM) NAIL INTERTROCHANTRIC (Right)    Patient reports pain as moderate.  Anesthesia has declined to do a spinal anesthetic on this patient until tomorrow.  I discussed the surgery at length with to the patient's daughters and herself.  Risks and benefits and postop protocol were discussed.  She will receive IV antibiotic prophylaxis.  She is a very high risk patient.  Objective:   VITALS:   Vitals:   08/15/18 2337 08/16/18 0734  BP: (!) 118/49 (!) 162/71  Pulse: 70 80  Resp: 20   Temp: 97.9 F (36.6 C) 98.7 F (37.1 C)  SpO2: 98% 97%    Neurologically intact ABD soft Neurovascular intact Sensation intact distally Intact pulses distally Dorsiflexion/Plantar flexion intact  LABS Recent Labs    08/15/18 1003  HGB 7.1*  HCT 20.7*  WBC 8.7  PLT 128*    Recent Labs    08/15/18 0959  NA 132*  K 3.5  BUN 37*  CREATININE 3.31*  GLUCOSE 144*    Recent Labs    08/15/18 0959  INR 1.16     Assessment/Plan: * Surgery Date in Future * Procedure(s) (LRB): INTRAMEDULLARY (IM) NAIL INTERTROCHANTRIC (Right)   Plan right hip nailing with trochanteric fixation nail tomorrow.  Recheck CBC and hemoglobin today as she received 1 unit of packed cells yesterday. May need another unit tonight.  N.p.o. after midnight.

## 2018-08-16 NOTE — Progress Notes (Signed)
[facility]  ROUNDING NOTE   Subjective:   Family at bedside.   Hemodialysis treatment yesterday. Tolerated treatment well. UF of 1 liter. 1 Unit PRBC with treatment.   Objective:  Vital signs in last 24 hours:  Temp:  [97.4 F (36.3 C)-98.7 F (37.1 C)] 98.7 F (37.1 C) (12/12 0734) Pulse Rate:  [39-96] 80 (12/12 0734) Resp:  [15-34] 20 (12/11 2337) BP: (118-195)/(45-87) 162/71 (12/12 0734) SpO2:  [97 %-100 %] 97 % (12/12 0734) Weight:  [122 kg-122.5 kg] 122 kg (12/11 1811)  Weight change: 15.5 kg Filed Weights   08/15/18 0500 08/15/18 1445 08/15/18 1811  Weight: 107 kg 122.5 kg 122 kg    Intake/Output: I/O last 3 completed shifts: In: 350 [Blood:350] Out: 1932 [Urine:900; Other:1032]   Intake/Output this shift:  No intake/output data recorded.  Physical Exam: General: No acute distress  Head: Normocephalic, atraumatic. Moist oral mucosal membranes  Eyes: Anicteric  Neck: Supple, trachea midline  Lungs:  Clear to auscultation, normal effort  Heart: S1S2 no rubs  Abdomen:  Soft, nontender, bowel sounds present  Extremities: No peripheral edema.  Neurologic: Awake, alert, following commands  Skin: No lesions  Access: LUE AVF    Basic Metabolic Panel: Recent Labs  Lab 08/11/18 0057 08/12/18 0357 08/13/18 0333 08/13/18 1206 08/15/18 0959 08/15/18 1300  NA 133* 134* 132*  --  132*  --   K 3.6 4.1 3.8  --  3.5  --   CL 99 98 97*  --  96*  --   CO2 26 29 28   --  29  --   GLUCOSE 147* 131* 132*  --  144*  --   BUN 16 27* 37*  --  37*  --   CREATININE 2.20* 3.83* 4.39*  --  3.31*  --   CALCIUM 8.1* 8.5* 8.3*  --  8.4*  --   MG 1.8  --   --   --   --   --   PHOS 2.7  --   --  4.1  --  2.5    Liver Function Tests: Recent Labs  Lab 08/11/18 0057 08/15/18 1300  AST 25  --   ALT 14  --   ALKPHOS 50  --   BILITOT 0.8  --   PROT 5.6*  --   ALBUMIN 3.3* 2.6*   No results for input(s): LIPASE, AMYLASE in the last 168 hours. No results  for input(s): AMMONIA in the last 168 hours.  CBC: Recent Labs  Lab 08/11/18 0057 08/12/18 0357 08/15/18 1003 08/16/18 1346  WBC 10.2 8.9 8.7 8.1  NEUTROABS 8.0*  --   --   --   HGB 9.8* 8.7* 7.1* 9.0*  HCT 29.3* 26.6* 20.7* 27.0*  MCV 107.3* 109.0* 106.7* 104.2*  PLT 90* 99* 128* 120*    Cardiac Enzymes: No results for input(s): CKTOTAL, CKMB, CKMBINDEX, TROPONINI in the last 168 hours.  BNP: Invalid input(s): POCBNP  CBG: Recent Labs  Lab 08/15/18 1149 08/15/18 1852 08/15/18 2107 08/16/18 0736 08/16/18 1143  GLUCAP 120* 117* 146* 112* 148*    Microbiology: Results for orders placed or performed during the hospital encounter of 10/11/17  MRSA PCR Screening     Status: None   Collection Time: 10/14/17  5:30 PM  Result Value Ref Range Status   MRSA by PCR NEGATIVE NEGATIVE Final    Comment:        The GeneXpert MRSA Assay (FDA approved for NASAL specimens only), is one  component of a comprehensive MRSA colonization surveillance program. It is not intended to diagnose MRSA infection nor to guide or monitor treatment for MRSA infections. Performed at [facility], [address] [place], [place] [address]     Coagulation Studies: Recent Labs    08/15/18 0959  LABPROT 14.7  INR 1.16    Urinalysis: No results for input(s): COLORURINE, LABSPEC, PHURINE, GLUCOSEU, HGBUR, BILIRUBINUR, KETONESUR, PROTEINUR, UROBILINOGEN, NITRITE, LEUKOCYTESUR in the last 72 hours.  Invalid input(s): APPERANCEUR    Imaging: No results found.   Medications:   . sodium chloride    .  ceFAZolin (ANCEF) IV    . [START ON 08/17/2018] clindamycin (CLEOCIN) IV     . sodium chloride   Intravenous Once  . carvedilol  25 mg Oral Q M,W,F-1800  . [START ON 08/17/2018] chlorhexidine  1 application Topical Once  . Chlorhexidine Gluconate Cloth  6 each Topical Q0600  . cholecalciferol  5,000 Units Oral Daily  . epoetin (EPOGEN/PROCRIT) injection  10,000 Units  Intravenous Q M,W,F-HD  . Ferrous Fumarate  1 tablet Oral Daily   And  . iron polysaccharides  150 mg Oral Daily  . fluconazole  150 mg Oral Once  . FLUoxetine  20 mg Oral Daily  . hydrALAZINE  50 mg Oral BID  . insulin aspart  0-15 Units Subcutaneous TID WC  . insulin aspart  0-5 Units Subcutaneous QHS  . irbesartan  300 mg Oral Daily  . isosorbide mononitrate  30 mg Oral QHS  . lactulose  20 g Oral Once  . levothyroxine  75 mcg Oral QAC breakfast  . rosuvastatin  10 mg Oral QPM  . tranexamic acid (CYKLOKAPRON) topical -INTRAOP  2,000 mg Topical Once  . traZODone  50 mg Oral QHS   acetaminophen **OR** acetaminophen, albuterol, bisacodyl, HYDROmorphone (DILAUDID) injection, ondansetron **OR** ondansetron (ZOFRAN) IV, senna-docusate, traMADol  Assessment/ Plan:  82 y.o. female with end-stage renal disease, coronary disease, carotid stenosis, history of stroke, diabetes, CABG, obstructive sleep apnea, history of renal artery stenosis, admitfed for right hip fracture 08/10/18.  [employer] [facility]/MWF/[facility]/99.5  1.  ESRD on HD MWF. Hemodialysis for after procedure tomorrow.   2.  Anemia of chronic kidney disease.  status post PRBC transfusion. Hemoglobin 9.  - EPO with HD treatment   3.  Hypertension: blood pressure at goal - carvedilol, hydralazine, irbesartan.  4. Secondary Hyperparathyroidism: PTH low at 100. Outpatient phosphorus and calcium at goal.  - not currently on binders.    LOS: 5 Taygan Connell 12/12/20192:05 PM

## 2018-08-16 NOTE — Anesthesia Preprocedure Evaluation (Addendum)
Anesthesia Evaluation    Airway Mallampati: III       Dental   Pulmonary asthma , sleep apnea , COPD,  COPD inhaler,           Cardiovascular hypertension, + CAD, + Past MI and +CHF  (-) dysrhythmias (-) Valvular Problems/Murmurs     Neuro/Psych CVA    GI/Hepatic   Endo/Other  diabetes, Type 2, Oral Hypoglycemic AgentsHypothyroidism   Renal/GU ESRF and DialysisRenal disease     Musculoskeletal   Abdominal   Peds  Hematology   Anesthesia Other Findings Past Medical History: No date: ACE-inhibitor cough No date: Aortic valve disorder     Comment:  Echo in 2009 showed mean aortic valve gradient of 13               mmHg, suggesting very mild stenosis. Echo (12/10)               suggested aortic sclerosis only No date: Carotid artery disease (HCC)     Comment:  mild, carotid dopplers 12/2009 No date: CHF (congestive heart failure) (HCC) No date: COPD (chronic obstructive pulmonary disease) (HCC) No date: Coronary artery disease     Comment:  s/p anterior MI in 1996 followed by CABG. Lexiscan               myoview (4/11): EF 67%, normal perfusion with no evidence              for ischemia or infarction.  No date: Diabetes mellitus     Comment:  type 2 No date: Diastolic heart failure     Comment:  most recent echo (12/10) showed EF 55-60% with mild LVH,              grade I diastolic dysfunction, mild LAE. No date: ESRD (end stage renal disease) on dialysis ([place]) No date: Hyperlipidemia No date: Hypertension     Comment:  resistant hyptertension times many years. the patient               does have renal artery stenosis. she has tried calcium               channel blockers in the past and states that she would               not take them now because she had some problems with her               gums which her dentist identified as calcium-channel               blocker side effects. No date: Hypothyroidism No date:  Mild asthma     Comment:  PFT 02/05/10 FEV1 1.38, FEV1% 73, TLC 3.78 (86%), DLCO               48%, +BD No date: Myocardial infarction ([place]) No date: Obesity No date: Obesity hypoventilation syndrome (HCC)     Comment:    No date: Obstructive sleep apnea     Comment:  PSG 01/05/2006 AHI 24.8, CPAP 9cm H2O No date: Pulmonary nodule, right No date: Renal artery stenosis (HCC)     Comment:  The patient has an occluded right renal artery and an               atrophic right kidney. There is 20% left renal artery               stenosis. This was seen by catheterization in 2007 No  date: Stroke [facility])   Reproductive/Obstetrics                            Anesthesia Physical Anesthesia Plan  ASA: IV and emergent  Anesthesia Plan: Spinal   Post-op Pain Management:    Induction:   PONV Risk Score and Plan:   Airway Management Planned: Nasal Cannula  Additional Equipment:   Intra-op Plan:   Post-operative Plan:   Informed Consent: I have reviewed the patients History and Physical, chart, labs and discussed the procedure including the risks, benefits and alternatives for the proposed anesthesia with the patient or authorized representative who has indicated his/her understanding and acceptance.     Plan Discussed with:   Anesthesia Plan Comments: (Patient has cardiac clearance for this procedure.  )       Anesthesia Quick Evaluation

## 2018-08-16 NOTE — Care Management Important Message (Signed)
Important Message  Patient Details  Name: Dawn Bradford MRN: [id number] Date of Birth: September 12, 1935   Medicare Important Message Given:  Yes    [name] 08/16/2018, 12:08 PM

## 2018-08-17 ENCOUNTER — Inpatient Hospital Stay: Payer: Medicare Other | Admitting: Anesthesiology

## 2018-08-17 ENCOUNTER — Inpatient Hospital Stay: Payer: Medicare Other

## 2018-08-17 ENCOUNTER — Encounter
Admission: EM | Disposition: A | Discharge status: Skilled Nursing Facility | Payer: Self-pay | Source: Home / Self Care | Attending: Internal Medicine

## 2018-08-17 HISTORY — PX: INTRAMEDULLARY (IM) NAIL INTERTROCHANTERIC: SHX5875

## 2018-08-17 LAB — RENAL FUNCTION PANEL
Albumin: 2.8 g/dL — ABNORMAL LOW (ref 3.5–5.0)
Anion gap: 10 (ref 5–15)
BUN: 40 mg/dL — ABNORMAL HIGH (ref 8–23)
CO2: 23 mmol/L (ref 22–32)
Calcium: 8.3 mg/dL — ABNORMAL LOW (ref 8.9–10.3)
Chloride: 97 mmol/L — ABNORMAL LOW (ref 98–111)
Creatinine, Ser: 3.22 mg/dL — ABNORMAL HIGH (ref 0.44–1.00)
GFR calc Af Amer: 15 mL/min — ABNORMAL LOW (ref >60)
GFR calc non Af Amer: 13 mL/min — ABNORMAL LOW (ref >60)
Glucose, Bld: 158 mg/dL — ABNORMAL HIGH (ref 70–99)
Phosphorus: 3.6 mg/dL (ref 2.5–4.6)
Potassium: 3.7 mmol/L (ref 3.5–5.1)
Sodium: 130 mmol/L — ABNORMAL LOW (ref 135–145)

## 2018-08-17 LAB — HEMOGLOBIN AND HEMATOCRIT, BLOOD
HCT: 25.8 % — ABNORMAL LOW (ref 36.0–46.0)
HEMOGLOBIN: 8.5 g/dL — AB (ref 12.0–15.0)

## 2018-08-17 LAB — CBC
HEMATOCRIT: 25.1 % — AB (ref 36.0–46.0)
Hemoglobin: 8.4 g/dL — ABNORMAL LOW (ref 12.0–15.0)
MCH: 34.7 pg — AB (ref 26.0–34.0)
MCHC: 33.5 g/dL (ref 30.0–36.0)
MCV: 103.7 fL — AB (ref 80.0–100.0)
Platelets: 153 10*3/uL (ref 150–400)
RBC: 2.42 MIL/uL — ABNORMAL LOW (ref 3.87–5.11)
RDW: 15 % (ref 11.5–15.5)
WBC: 18.1 10*3/uL — ABNORMAL HIGH (ref 4.0–10.5)
nRBC: 0 % (ref 0.0–0.2)

## 2018-08-17 LAB — POTASSIUM: Potassium: 3.6 mmol/L (ref 3.5–5.1)

## 2018-08-17 LAB — GLUCOSE, CAPILLARY
GLUCOSE-CAPILLARY: 128 mg/dL — AB (ref 70–99)
Glucose-Capillary: 123 mg/dL — ABNORMAL HIGH (ref 70–99)
Glucose-Capillary: 127 mg/dL — ABNORMAL HIGH (ref 70–99)

## 2018-08-17 LAB — PLATELET COUNT: Platelets: 143 10*3/uL — ABNORMAL LOW (ref 150–400)

## 2018-08-17 LAB — PREPARE RBC (CROSSMATCH)

## 2018-08-17 SURGERY — FIXATION, FRACTURE, INTERTROCHANTERIC, WITH INTRAMEDULLARY ROD
Anesthesia: Spinal | Laterality: Right

## 2018-08-17 MED ORDER — MENTHOL 3 MG MT LOZG
1.0000 | LOZENGE | OROMUCOSAL | Status: DC | PRN
  Filled 2018-08-17: qty 9

## 2018-08-17 MED ORDER — SODIUM CHLORIDE 0.9 % IV SOLN
INTRAVENOUS | Status: DC | PRN
  Administered 2018-08-17: 50 ug/min via INTRAVENOUS

## 2018-08-17 MED ORDER — METOCLOPRAMIDE HCL 5 MG/ML IJ SOLN: 5.0000 mg | Freq: Three times a day (TID) | INTRAMUSCULAR | Status: DC | PRN

## 2018-08-17 MED ORDER — MIDAZOLAM HCL 5 MG/5ML IJ SOLN
INTRAMUSCULAR | Status: DC | PRN
  Administered 2018-08-17 (×2): 1 mg via INTRAVENOUS

## 2018-08-17 MED ORDER — PROPOFOL 10 MG/ML IV BOLUS
INTRAVENOUS | Status: AC
  Filled 2018-08-17: qty 20

## 2018-08-17 MED ORDER — BUPIVACAINE HCL (PF) 0.5 % IJ SOLN
INTRAMUSCULAR | Status: DC | PRN
  Administered 2018-08-17: 3 mL

## 2018-08-17 MED ORDER — LORAZEPAM 2 MG/ML IJ SOLN
1.0000 mg | Freq: Once | INTRAMUSCULAR | Status: AC
  Administered 2018-08-17: 1 mg via INTRAVENOUS
  Filled 2018-08-17: qty 1

## 2018-08-17 MED ORDER — FENTANYL CITRATE (PF) 100 MCG/2ML IJ SOLN: 25.0000 ug | INTRAMUSCULAR | Status: DC | PRN

## 2018-08-17 MED ORDER — ONDANSETRON HCL 4 MG/2ML IJ SOLN: 4.0000 mg | Freq: Four times a day (QID) | INTRAMUSCULAR | Status: DC | PRN

## 2018-08-17 MED ORDER — SODIUM CHLORIDE 0.45 % IV SOLN
INTRAVENOUS | Status: DC
  Administered 2018-08-17: 21:00:00 via INTRAVENOUS

## 2018-08-17 MED ORDER — METOCLOPRAMIDE HCL 10 MG PO TABS: 5.0000 mg | ORAL_TABLET | Freq: Three times a day (TID) | ORAL | Status: DC | PRN

## 2018-08-17 MED ORDER — ALUM & MAG HYDROXIDE-SIMETH 200-200-20 MG/5ML PO SUSP: 30.0000 mL | ORAL | Status: DC | PRN

## 2018-08-17 MED ORDER — KETAMINE HCL 50 MG/ML IJ SOLN
INTRAMUSCULAR | Status: AC
  Filled 2018-08-17: qty 10

## 2018-08-17 MED ORDER — PHENOL 1.4 % MT LIQD
1.0000 | OROMUCOSAL | Status: DC | PRN
  Filled 2018-08-17: qty 177

## 2018-08-17 MED ORDER — CLINDAMYCIN PHOSPHATE 600 MG/50ML IV SOLN
600.0000 mg | Freq: Three times a day (TID) | INTRAVENOUS | Status: AC
  Administered 2018-08-18 (×3): 600 mg via INTRAVENOUS
  Filled 2018-08-17 (×3): qty 50

## 2018-08-17 MED ORDER — TRAMADOL HCL 50 MG PO TABS
50.0000 mg | ORAL_TABLET | Freq: Four times a day (QID) | ORAL | Status: DC
  Administered 2018-08-18 (×2): 50 mg via ORAL
  Filled 2018-08-17 (×2): qty 1

## 2018-08-17 MED ORDER — PHENYLEPHRINE HCL 10 MG/ML IJ SOLN
INTRAMUSCULAR | Status: DC | PRN
  Administered 2018-08-17 (×6): 100 ug via INTRAVENOUS

## 2018-08-17 MED ORDER — NEOMYCIN-POLYMYXIN B GU 40-200000 IR SOLN
Status: DC | PRN
  Administered 2018-08-17: 2 mL

## 2018-08-17 MED ORDER — ACETAMINOPHEN 325 MG PO TABS: 325.0000 mg | ORAL_TABLET | Freq: Four times a day (QID) | ORAL | Status: DC | PRN

## 2018-08-17 MED ORDER — BISACODYL 10 MG RE SUPP
10.0000 mg | Freq: Every day | RECTAL | Status: DC | PRN
  Filled 2018-08-17: qty 1

## 2018-08-17 MED ORDER — KETAMINE HCL 10 MG/ML IJ SOLN
INTRAMUSCULAR | Status: DC | PRN
  Administered 2018-08-17: 30 mg via INTRAVENOUS

## 2018-08-17 MED ORDER — ONDANSETRON HCL 4 MG PO TABS: 4.0000 mg | ORAL_TABLET | Freq: Four times a day (QID) | ORAL | Status: DC | PRN

## 2018-08-17 MED ORDER — BUPIVACAINE-EPINEPHRINE (PF) 0.25% -1:200000 IJ SOLN
INTRAMUSCULAR | Status: DC | PRN
  Administered 2018-08-17: 30 mL via PERINEURAL

## 2018-08-17 MED ORDER — MORPHINE SULFATE (PF) 2 MG/ML IV SOLN: 1.0000 mg | INTRAVENOUS | Status: DC | PRN

## 2018-08-17 MED ORDER — SENNA 8.6 MG PO TABS
1.0000 | ORAL_TABLET | Freq: Two times a day (BID) | ORAL | Status: DC
  Administered 2018-08-18 – 2018-08-21 (×7): 8.6 mg via ORAL
  Filled 2018-08-17 (×7): qty 1

## 2018-08-17 MED ORDER — FERROUS SULFATE 325 (65 FE) MG PO TABS
325.0000 mg | ORAL_TABLET | Freq: Every day | ORAL | Status: DC
  Administered 2018-08-18 – 2018-08-21 (×4): 325 mg via ORAL
  Filled 2018-08-17 (×4): qty 1

## 2018-08-17 MED ORDER — ZOLPIDEM TARTRATE 5 MG PO TABS: 5.0000 mg | ORAL_TABLET | Freq: Every evening | ORAL | Status: DC | PRN

## 2018-08-17 MED ORDER — SODIUM CHLORIDE 0.9% IV SOLUTION: Freq: Once | INTRAVENOUS | Status: DC

## 2018-08-17 MED ORDER — FLEET ENEMA 7-19 GM/118ML RE ENEM: 1.0000 | ENEMA | Freq: Once | RECTAL | Status: DC | PRN

## 2018-08-17 MED ORDER — CEFAZOLIN SODIUM-DEXTROSE 2-4 GM/100ML-% IV SOLN
2.0000 g | Freq: Three times a day (TID) | INTRAVENOUS | Status: AC
  Administered 2018-08-18 (×3): 2 g via INTRAVENOUS
  Filled 2018-08-17 (×3): qty 100

## 2018-08-17 MED ORDER — BUPIVACAINE-EPINEPHRINE (PF) 0.25% -1:200000 IJ SOLN
INTRAMUSCULAR | Status: AC
  Filled 2018-08-17: qty 30

## 2018-08-17 MED ORDER — VASOPRESSIN 20 UNIT/ML IV SOLN
INTRAVENOUS | Status: DC | PRN
  Administered 2018-08-17 (×4): 1 [IU] via INTRAVENOUS

## 2018-08-17 MED ORDER — MAGNESIUM HYDROXIDE 400 MG/5ML PO SUSP
30.0000 mL | Freq: Every day | ORAL | Status: DC | PRN
  Administered 2018-08-18: 30 mL via ORAL
  Filled 2018-08-17: qty 30

## 2018-08-17 MED ORDER — MIDAZOLAM HCL 2 MG/2ML IJ SOLN
INTRAMUSCULAR | Status: AC
  Filled 2018-08-17: qty 2

## 2018-08-17 MED ORDER — ONDANSETRON HCL 4 MG/2ML IJ SOLN: 4.0000 mg | Freq: Once | INTRAMUSCULAR | Status: DC | PRN

## 2018-08-17 SURGICAL SUPPLY — 39 items
BIT DRILL FLUTED FEMUR 4.2/3 (BIT) ×2 IMPLANT
BLADE TFNA HELICAL 90 STERILE (Anchor) ×2 IMPLANT
BNDG COHESIVE 4X5 TAN STRL (GAUZE/BANDAGES/DRESSINGS) ×3 IMPLANT
CANISTER SUCT 1200ML W/VALVE (MISCELLANEOUS) ×3 IMPLANT
CHLORAPREP W/TINT 26ML (MISCELLANEOUS) ×6 IMPLANT
COVER WAND RF STERILE (DRAPES) ×3 IMPLANT
DRAPE C-ARMOR (DRAPES) IMPLANT
DRAPE INCISE 23X17 IOBAN STRL (DRAPES) ×2
DRAPE INCISE 23X17 STRL (DRAPES) ×1 IMPLANT
DRAPE INCISE IOBAN 23X17 STRL (DRAPES) ×1 IMPLANT
DRSG AQUACEL AG ADV 3.5X10 (GAUZE/BANDAGES/DRESSINGS) IMPLANT
DRSG AQUACEL AG ADV 3.5X14 (GAUZE/BANDAGES/DRESSINGS) IMPLANT
ELECT REM PT RETURN 9FT ADLT (ELECTROSURGICAL) ×3
ELECTRODE REM PT RTRN 9FT ADLT (ELECTROSURGICAL) ×1 IMPLANT
GAUZE PETRO XEROFOAM 1X8 (MISCELLANEOUS) IMPLANT
GAUZE SPONGE 4X4 12PLY STRL (GAUZE/BANDAGES/DRESSINGS) ×5 IMPLANT
GLOVE INDICATOR 8.0 STRL GRN (GLOVE) ×3 IMPLANT
GLOVE SURG ORTHO 8.5 STRL (GLOVE) ×3 IMPLANT
GOWN STRL REUS W/ TWL LRG LVL3 (GOWN DISPOSABLE) ×1 IMPLANT
GOWN STRL REUS W/TWL LRG LVL3 (GOWN DISPOSABLE) ×2
GOWN STRL REUS W/TWL LRG LVL4 (GOWN DISPOSABLE) ×3 IMPLANT
KIT TURNOVER KIT A (KITS) ×3 IMPLANT
MAT ABSORB  FLUID 56X50 GRAY (MISCELLANEOUS) ×2
MAT ABSORB FLUID 56X50 GRAY (MISCELLANEOUS) ×1 IMPLANT
NAIL TROCH FIX 10X170 130 (Nail) ×2 IMPLANT
NDL SPNL 18GX3.5 QUINCKE PK (NEEDLE) ×1 IMPLANT
NEEDLE SPNL 18GX3.5 QUINCKE PK (NEEDLE) ×3 IMPLANT
NS IRRIG 500ML POUR BTL (IV SOLUTION) ×3 IMPLANT
PACK HIP COMPR (MISCELLANEOUS) ×3 IMPLANT
PAD ABD DERMACEA PRESS 5X9 (GAUZE/BANDAGES/DRESSINGS) ×4 IMPLANT
REAMER ROD DEEP FLUTE 2.5X950 (INSTRUMENTS) ×5 IMPLANT
SCREW LOCKING 5.0X34MM (Screw) ×2 IMPLANT
STAPLER SKIN PROX 35W (STAPLE) ×3 IMPLANT
SUCTION FRAZIER HANDLE 10FR (MISCELLANEOUS) ×2
SUCTION TUBE FRAZIER 10FR DISP (MISCELLANEOUS) ×1 IMPLANT
SUT VIC AB 0 CT1 36 (SUTURE) ×5 IMPLANT
SUT VIC AB 2-0 CT1 27 (SUTURE) ×2
SUT VIC AB 2-0 CT1 TAPERPNT 27 (SUTURE) ×1 IMPLANT
SYR 30ML LL (SYRINGE) ×3 IMPLANT

## 2018-08-17 NOTE — Progress Notes (Addendum)
[place] [facility] [place] NAME: Dawn Bradford    MR#:  [id number]  DATE OF BIRTH:  April 02, 1936  SUBJECTIVE:  Resting comfortably.  Received 1 unit of blood transfusion, likely surgery tomorrow. REVIEW OF SYSTEMS:   Review of Systems  Constitutional: Negative for chills, fever and weight loss.  HENT: Negative for ear discharge, ear pain and nosebleeds.   Eyes: Negative for blurred vision, pain and discharge.  Respiratory: Negative for sputum production, shortness of breath, wheezing and stridor.   Cardiovascular: Negative for chest pain, palpitations, orthopnea and PND.  Gastrointestinal: Negative for abdominal pain, diarrhea, nausea and vomiting.  Genitourinary: Negative for frequency and urgency.  Musculoskeletal: Positive for joint pain. Negative for back pain.  Neurological: Negative for sensory change, speech change, focal weakness and weakness.  Psychiatric/Behavioral: Negative for depression and hallucinations. The patient is not nervous/anxious.      m  Tolerating Diet:ye Tolerating PT: pending surgery  DRUG ALLERGIES:   Allergies  Allergen Reactions  . Ace Inhibitors Other (See Comments)    Reaction:  Unknown   . Amlodipine Other (See Comments)    Reaction:  Unknown   . Clonidine Hydrochloride Other (See Comments)    Reaction:  Unknown   . Codeine Hives  . Latex Hives  . Metformin Nausea And Vomiting  . Tape Rash    VITALS:  Blood pressure (!) 176/64, pulse 90, temperature 98.1 F (36.7 C), temperature source Oral, resp. rate 18, height 5\' 3"  (1.6 m), weight 122 kg, last menstrual period 02/01/1986, SpO2 96 %.  PHYSICAL EXAMINATION:   Physical Exam  GENERAL:  82 y.o.-year-old patient lying in the bed with no acute distress.  EYES: Pupils equal, round, reactive to light . No scleral icterus. Extraocular muscles intact.  HEENT: Head atraumatic, normocephalic. Oropharynx and nasopharynx clear.  NECK:  Supple, no jugular  venous distention. No thyroid enlargement, no tenderness.  LUNGS: Normal breath sounds bilaterally, no wheezing, rales, rhonchi. No use of accessory muscles of respiration.  CARDIOVASCULAR: S1, S2 normal. No murmurs, rubs, or gallops.  ABDOMEN: Soft, nontender, nondistended. Bowel sounds present. No organomegaly or mass.  EXTREMITIES: No cyanosis, clubbing or edema b/l.   Right hip deformity due to fracture NEUROLOGIC: Cranial nerves II through XII are intact. No focal Motor or sensory deficits b/l.   PSYCHIATRIC:  patient anxious today  SKIN: No obvious rash, lesion, or ulcer.   LABORATORY PANEL:  CBC Recent Labs  Lab 08/16/18 1346  WBC 8.1  HGB 9.0*  HCT 27.0*  PLT 120*    Chemistries  Recent Labs  Lab 08/11/18 0057  08/15/18 0959 08/17/18 1000  NA 133*   < > 132*  --   K 3.6   < > 3.5 3.6  CL 99   < > 96*  --   CO2 26   < > 29  --   GLUCOSE 147*   < > 144*  --   BUN 16   < > 37*  --   CREATININE 2.20*   < > 3.31*  --   CALCIUM 8.1*   < > 8.4*  --   MG 1.8  --   --   --   AST 25  --   --   --   ALT 14  --   --   --   ALKPHOS 50  --   --   --   BILITOT 0.8  --   --   --    < > =  values in this interval not displayed.   Cardiac Enzymes No results for input(s): TROPONINI in the last 168 hours. RADIOLOGY:  No results found. ASSESSMENT AND PLAN:  Dawn Bradford is a 82 y.o. female with medical history as listed below which notably includes end-stage renal disease on hemodialysis, history of CAD with a prior MI on Plavix and aspirin, sleep apnea, and at least some degree of chronic CHF.  She presents by EMS for evaluation of right hip pain that was acute in onset and severe. She fell coming out of the BR.  1. Acute right hip fracture s/p mechanical fall at home -Seen by Dr. Sabra Heck from surgery, scheduled for right hip surgery today .  Continue pain medicines.  Likely discharge to rehab after surgery.  2.h/o CAD and Chronic diastolic CHF -seen by Dr End-- high  perioperative risk. No further work up recommended. Pt best at baseline -cont cardiac meds, hold Plavix in preparation for hip surgery.  3/ESRD on HD -seen by Dr Juleen China -patient to get hemodialysis after surgery.  4. Anemia of CKD Post 1 unit of packed RBC transfusion, hemoglobin stable around 9.   5. HTN Continue carvedilol, hydralazine, irbesartan for blood pressure control.  BP high this morning likely due to anxiety, continue Coreg, hydralazine, irbesartan, use IV hydralazine 20 mg if BP more than 160/90. 6, anxiety: Use Ativan 1 mg IV 1 time and see how it helps. Discussed with patient's daughter at bedside.   Case discussed with Care Management/Social Worker. Management plans discussed with the patient, family and they are in agreement.  CODE STATUS: full  DVT Prophylaxis:  TOTAL TIME TAKING CARE OF THIS PATIENT: *30* minutes.  >50% time spent on counselling and coordination of care  POSSIBLE D/C IN 2-3 DAYS, DEPENDING ON CLINICAL CONDITION.  Note: This dictation was prepared with Dragon dictation along with smaller phrase technology. Any transcriptional errors that result from this process are unintentional.  Epifanio Lesches M.D on 08/17/2018 at 11:01 AM  Between 7am to 6pm - Pager - [phone]  After 6pm go to [email or web address] - Proofreader  [facility]  Office  [phone]  CC: Primary care physician; Hortencia Pilar, MDPatient ID: Dawn Bradford, female   DOB: 1936-03-06, 82 y.o.   MRN: [id number]

## 2018-08-17 NOTE — H&P (Signed)
AFTER BEING TOLD THAT THE SURGERY COULD NOT GO UNTIL LATER TONIGHT, THE OR CALLED BACK AND SAID THAT IT COULD GO AT 2:30 PM./  THEREFORE WE HAVE ELECTED TO PROCEED [place].  THE PATIENT'S FAMILY MUCH PREFERRED THIS.

## 2018-08-17 NOTE — Progress Notes (Signed)
Pre HD assessment    08/17/18 2054  Vital Signs  Temp (!) 97.5 F (36.4 C)  Temp Source Axillary  Pulse Rate 82  Pulse Rate Source Monitor  Resp 16  BP 125/63  BP Location Right Wrist  BP Method Automatic  Patient Position (if appropriate) Lying  Oxygen Therapy  SpO2 99 %  O2 Device Nasal Cannula  O2 Flow Rate (L/min) 2 L/min  Pain Assessment  Pain Scale 0-10  Pain Score 0  Dialysis Weight  Weight 122 kg  Type of Weight Pre-Dialysis  Time-Out for Hemodialysis  What Procedure? HD  Pt Identifiers(min of two) First/Last Name;MRN/Account#  Correct Site? Yes  Correct Side? Yes  Correct Procedure? Yes  Consents Verified? Yes  Rad Studies Available? N/A  Safety Precautions Reviewed? Yes  Engineer, civil (consulting) Number  ([id number])  Station Number 1  UF/Alarm Test Passed  Conductivity: Meter 14  Conductivity: Machine  14.1  pH 7.4  Reverse Osmosis main  Normal Saline Lot Number [id number]  Dialyzer Lot Number [id number]  Disposable Set Lot Number [id number]  Machine Temperature 98.6 F (37 C)  Musician and Audible Yes  Blood Lines Intact and Secured Yes  Pre Treatment Patient Checks  Vascular access used during treatment Fistula  Hepatitis B Surface Antigen Results Negative  Date Hepatitis B Surface Antigen Drawn 08/23/17  Hepatitis B Surface Antibody  (>10)  Date Hepatitis B Surface Antibody Drawn 08/23/17  Hemodialysis Consent Verified Yes  Hemodialysis Standing Orders Initiated Yes  ECG (Telemetry) Monitor On Yes  Prime Ordered Normal Saline  Length of  DialysisTreatment -hour(s) 3 Hour(s)  Dialyzer Elisio 17H NR  Dialysate 3K, 2.5 Ca  Dialysis Anticoagulant None  Dialysate Flow Ordered 600  Blood Flow Rate Ordered 400 mL/min  Ultrafiltration Goal 1 Liters  Pre Treatment Labs CBC;Renal panel  Dialysis Blood Pressure Support Ordered Normal Saline  Education / Care Plan  Dialysis Education Provided Yes  Documented Education in Care Plan Yes  Fistula / Graft  Left Forearm Arteriovenous fistula  No Placement Date or Time found.   Placed prior to admission: Yes  Orientation: Left  Access Location: Forearm  Access Type: Arteriovenous fistula  Site Condition No complications  Fistula / Graft Assessment Present;Thrill;Bruit  Drainage Description None

## 2018-08-17 NOTE — OR Nursing (Signed)
Patient incontinent large amount of urine.  Bed changed,but dressing started to ooze sanguinous drainage.  Reinforced with 4x4's, ABD's, and paper tape. Reported to nurse Neoma Laming.

## 2018-08-17 NOTE — Care Management (Signed)
Very hi risk patient. Did well in the OR. Needed neo and vaso to maintain BPs intraop. Doing well in the PACU. BP in the 110/60 range, without support. Will d/c to floor. Dr. Posey Pronto has taken over the care of this pt. Nephrologist ok with dialysis tomorrow. Hb 8.5 now. Dr. Posey Pronto will decide on transfusion.

## 2018-08-17 NOTE — Progress Notes (Signed)
Subjective: * Surgery Date in Future * Procedure(s) (LRB): INTRAMEDULLARY (IM) NAIL INTERTROCHANTRIC (Right)   The operating room has informed us that they will not be able to start surgery until after 5pm today.  The patient needs dialysis today also.  We will need to put surgery off until tomorrow AM to allow for dialysis today. Dr Harlow Mares will resume care of the Orthopaedic care for this patient. Surgery scheduled for 10AM tomorrow.  Typed and crossed for 2 units of PRBC  Objective:   VITALS:   Vitals:   08/17/18 0436 08/17/18 0800  BP: (!) 160/60 (!) 176/64  Pulse: 89 90  Resp: (!) 22 18  Temp: 98.5 F (36.9 C) 98.1 F (36.7 C)  SpO2: 95% 96%     LABS Recent Labs    08/15/18 1003 08/16/18 1346  HGB 7.1* 9.0*  HCT 20.7* 27.0*  WBC 8.7 8.1  PLT 128* 120*    Recent Labs    08/15/18 0959 08/17/18 1000  NA 132*  --   K 3.5 3.6  BUN 37*  --   CREATININE 3.31*  --   GLUCOSE 144*  --     Recent Labs    08/15/18 0959  INR 1.16     Assessment/Plan: * Surgery Date in Future * Procedure(s) (LRB): INTRAMEDULLARY (IM) NAIL INTERTROCHANTRIC (Right)   Resume diet today NPO after midnight.

## 2018-08-17 NOTE — H&P (Signed)
THE PATIENT WAS SEEN PRIOR TO SURGERY TODAY.  HISTORY, ALLERGIES, HOME MEDICATIONS AND OPERATIVE PROCEDURE WERE REVIEWED. RISKS AND BENEFITS OF SURGERY DISCUSSED WITH PATIENT AGAIN.  NO CHANGES FROM INITIAL HISTORY AND PHYSICAL NOTED.   HAVE DISCUSSES RISKS AGAIN WITH PATIENT AND 3 DAUGHTERS AND HAVE EMPHASIZED THE HIGH RISK NATURE OF THIS PATIENT.

## 2018-08-17 NOTE — Anesthesia Procedure Notes (Signed)
Spinal

## 2018-08-17 NOTE — OR Nursing (Signed)
Reported hgb 8.8 to Dr. Posey Pronto.  Stated that was good and if it was OK with Dr. Juleen China , that patient could have her dialysis tomorrow.  Spoke with Dr. Juleen China, stated that it was fine with him.  May take patient to her room.

## 2018-08-17 NOTE — Progress Notes (Signed)
Pre HD assessment    08/17/18 2055  Neurological  Level of Consciousness Alert  Orientation Level Oriented to person (confused)  Respiratory  Respiratory Pattern Regular;Unlabored  Chest Assessment Chest expansion symmetrical  Cardiac  Pulse Irregular  ECG Monitor Yes  Vascular  R Radial Pulse +2  L Radial Pulse +2  Edema Generalized;Right lower extremity;Left lower extremity  Integumentary  Integumentary (WDL) X  Skin Color Appropriate for ethnicity  Musculoskeletal  Musculoskeletal (WDL) X  Generalized Weakness Yes  Assistive Device None  GU Assessment  Genitourinary (WDL) X  Genitourinary Symptoms  (HD)  Psychosocial  Psychosocial (WDL) WDL

## 2018-08-17 NOTE — Progress Notes (Signed)
HD tx start    08/17/18 2104  Vital Signs  Pulse Rate 82  Pulse Rate Source Monitor  Resp 16  BP 125/62  BP Location Right Wrist  BP Method Automatic  Patient Position (if appropriate) Lying  Oxygen Therapy  SpO2 99 %  O2 Device Nasal Cannula  O2 Flow Rate (L/min) 2 L/min  During Hemodialysis Assessment  Blood Flow Rate (mL/min) 400 mL/min  Arterial Pressure (mmHg) -160 mmHg  Venous Pressure (mmHg) 160 mmHg  Transmembrane Pressure (mmHg) 50 mmHg  Ultrafiltration Rate (mL/min) 500 mL/min  Dialysate Flow Rate (mL/min) 600 ml/min  Conductivity: Machine  14.5  HD Safety Checks Performed Yes  Dialysis Fluid Bolus Normal Saline  Bolus Amount (mL) 250 mL  Intra-Hemodialysis Comments Tx initiated  Fistula / Graft Left Forearm Arteriovenous fistula  No Placement Date or Time found.   Placed prior to admission: Yes  Orientation: Left  Access Location: Forearm  Access Type: Arteriovenous fistula  Status Accessed  Needle Size 15

## 2018-08-17 NOTE — Progress Notes (Signed)
[place] [facility] [place] NAME: Dawn Bradford    MR#:  [id number]  DATE OF BIRTH:  [birth year]  SUBJECTIVE:  Resting comfortably.  Received 1 unit of blood transfusion, likely surgery tomorrow. REVIEW OF SYSTEMS:   Review of Systems  Constitutional: Negative for chills, fever and weight loss.  HENT: Negative for ear discharge, ear pain and nosebleeds.   Eyes: Negative for blurred vision, pain and discharge.  Respiratory: Negative for sputum production, shortness of breath, wheezing and stridor.   Cardiovascular: Negative for chest pain, palpitations, orthopnea and PND.  Gastrointestinal: Negative for abdominal pain, diarrhea, nausea and vomiting.  Genitourinary: Negative for frequency and urgency.  Musculoskeletal: Positive for joint pain. Negative for back pain.  Neurological: Negative for sensory change, speech change, focal weakness and weakness.  Psychiatric/Behavioral: Negative for depression and hallucinations. The patient is not nervous/anxious.    Tolerating Diet:yes Tolerating PT: pending surgery  DRUG ALLERGIES:   Allergies  Allergen Reactions  . Ace Inhibitors Other (See Comments)    Reaction:  Unknown   . Amlodipine Other (See Comments)    Reaction:  Unknown   . Clonidine Hydrochloride Other (See Comments)    Reaction:  Unknown   . Codeine Hives  . Latex Hives  . Metformin Nausea And Vomiting  . Tape Rash    VITALS:  Blood pressure (!) 176/64, pulse 90, temperature 98.1 F (36.7 C), temperature source Oral, resp. rate 18, height 5\' 3"  (1.6 m), weight 122 kg, last menstrual period 02/01/1986, SpO2 96 %.  PHYSICAL EXAMINATION:   Physical Exam  GENERAL:  82 y.o.-year-old patient lying in the bed with no acute distress.  EYES: Pupils equal, round, reactive to light and accommodation. No scleral icterus. Extraocular muscles intact.  HEENT: Head atraumatic, normocephalic. Oropharynx and nasopharynx clear.  NECK:   Supple, no jugular venous distention. No thyroid enlargement, no tenderness.  LUNGS: Normal breath sounds bilaterally, no wheezing, rales, rhonchi. No use of accessory muscles of respiration.  CARDIOVASCULAR: S1, S2 normal. No murmurs, rubs, or gallops.  ABDOMEN: Soft, nontender, nondistended. Bowel sounds present. No organomegaly or mass.  EXTREMITIES: No cyanosis, clubbing or edema b/l.   Right hip deformity due to fracture NEUROLOGIC: Cranial nerves II through XII are intact. No focal Motor or sensory deficits b/l.   PSYCHIATRIC:  patient is resting/sleeping  SKIN: No obvious rash, lesion, or ulcer.   LABORATORY PANEL:  CBC Recent Labs  Lab 08/16/18 1346  WBC 8.1  HGB 9.0*  HCT 27.0*  PLT 120*    Chemistries  Recent Labs  Lab 08/11/18 0057  08/15/18 0959 08/17/18 1000  NA 133*   < > 132*  --   K 3.6   < > 3.5 3.6  CL 99   < > 96*  --   CO2 26   < > 29  --   GLUCOSE 147*   < > 144*  --   BUN 16   < > 37*  --   CREATININE 2.20*   < > 3.31*  --   CALCIUM 8.1*   < > 8.4*  --   MG 1.8  --   --   --   AST 25  --   --   --   ALT 14  --   --   --   ALKPHOS 50  --   --   --   BILITOT 0.8  --   --   --    < > =  values in this interval not displayed.   Cardiac Enzymes No results for input(s): TROPONINI in the last 168 hours. RADIOLOGY:  No results found. ASSESSMENT AND PLAN:  Dawn Bradford is a 82 y.o. female with medical history as listed below which notably includes end-stage renal disease on hemodialysis, history of CAD with a prior MI on Plavix and aspirin, sleep apnea, and at least some degree of chronic CHF.  She presents by EMS for evaluation of right hip pain that was acute in onset and severe. She fell coming out of the BR.  1. Acute right hip fracture s/p mechanical fall at home -Appreciate Dr Harlow Mares input--for surgery now under Spinal on wednesday-prn pain meds -d/c IVF -for hip surgery as per the recommendation.  2.h/o CAD and Chronic diastolic CHF -seen by Dr  End-- high perioperative risk. No further work up recommended. Pt best at baseline -cont cardiac meds  3/ESRD on HD -seen by Dr Juleen China -patient to get hemodialysis after surgery tomorrow.  4. Anemia of CKD Post 1 unit of packed RBC transfusion,  5. HTN Continue carvedilol, hydralazine, irbesartan for blood pressure control.  D/w family-- been in the room  Case discussed with Care Management/Social Worker. Management plans discussed with the patient, family and they are in agreement.  CODE STATUS: full  DVT Prophylaxis: SCD and heparin till Tuesday night  TOTAL TIME TAKING CARE OF THIS PATIENT: *30* minutes.  >50% time spent on counselling and coordination of care  POSSIBLE D/C IN 2-3 DAYS, DEPENDING ON CLINICAL CONDITION.  Note: This dictation was prepared with Dragon dictation along with smaller phrase technology. Any transcriptional errors that result from this process are unintentional.  Epifanio Lesches M.D on 08/17/2018 at 10:59 AM  Between 7am to 6pm - Pager - [phone]  After 6pm go to [email or web address] - Proofreader  [facility]  Office  [phone]  CC: Primary care physician; Hortencia Pilar, MDPatient ID: Dawn Bradford, female   DOB: 12/05/1935, 82 y.o.   MRN: [id number]

## 2018-08-17 NOTE — Anesthesia Post-op Follow-up Note (Signed)
Anesthesia QCDR form completed.        

## 2018-08-17 NOTE — Transfer of Care (Signed)
Immediate Anesthesia Transfer of Care Note  Patient: Dawn Bradford  Procedure(s) Performed: INTRAMEDULLARY (IM) NAIL INTERTROCHANTRIC (Right )  Patient Location: PACU  Anesthesia Type:Spinal  Level of Consciousness: awake, alert  and oriented  Airway & Oxygen Therapy: Patient Spontanous Breathing and Patient connected to nasal cannula oxygen  Post-op Assessment: Report given to RN and Post -op Vital signs reviewed and stable  Post vital signs: Reviewed and stable  Last Vitals:  Vitals Value Taken Time  BP    Temp    Pulse    Resp    SpO2      Last Pain:  Vitals:   08/17/18 1422  TempSrc:   PainSc: 4       Patients Stated Pain Goal: 2 (90/38/33 3832)  Complications: No apparent anesthesia complications

## 2018-08-17 NOTE — Progress Notes (Signed)
[facility]  ROUNDING NOTE   Subjective:   Family at bedside.   Surgery for today.   Objective:  Vital signs in last 24 hours:  Temp:  [98.1 F (36.7 C)-99.1 F (37.3 C)] 98.1 F (36.7 C) (12/13 0800) Pulse Rate:  [78-96] 96 (12/13 1422) Resp:  [18-22] 19 (12/13 1422) BP: (139-176)/(52-64) 139/64 (12/13 1422) SpO2:  [95 %-97 %] 97 % (12/13 1422)  Weight change:  Filed Weights   08/15/18 0500 08/15/18 1445 08/15/18 1811  Weight: 107 kg 122.5 kg 122 kg    Intake/Output: I/O last 3 completed shifts: In: 360 [P.O.:360] Out: 700 [Urine:700]   Intake/Output this shift:  No intake/output data recorded.  Physical Exam: General: No acute distress  Head: Normocephalic, atraumatic. Moist oral mucosal membranes  Eyes: Anicteric  Neck: Supple, trachea midline  Lungs:  Clear to auscultation, normal effort  Heart: S1S2 no rubs  Abdomen:  Soft, nontender, bowel sounds present  Extremities: No peripheral edema.  Neurologic: Awake, alert, following commands  Skin: No lesions  Access: LUE AVF    Basic Metabolic Panel: Recent Labs  Lab 08/11/18 0057 08/12/18 0357 08/13/18 0333 08/13/18 1206 08/15/18 0959 08/15/18 1300 08/17/18 1000  NA 133* 134* 132*  --  132*  --   --   K 3.6 4.1 3.8  --  3.5  --  3.6  CL 99 98 97*  --  96*  --   --   CO2 26 29 28   --  29  --   --   GLUCOSE 147* 131* 132*  --  144*  --   --   BUN 16 27* 37*  --  37*  --   --   CREATININE 2.20* 3.83* 4.39*  --  3.31*  --   --   CALCIUM 8.1* 8.5* 8.3*  --  8.4*  --   --   MG 1.8  --   --   --   --   --   --   PHOS 2.7  --   --  4.1  --  2.5  --     Liver Function Tests: Recent Labs  Lab 08/11/18 0057 08/15/18 1300  AST 25  --   ALT 14  --   ALKPHOS 50  --   BILITOT 0.8  --   PROT 5.6*  --   ALBUMIN 3.3* 2.6*   No results for input(s): LIPASE, AMYLASE in the last 168 hours. No results for input(s): AMMONIA in the last 168 hours.  CBC: Recent Labs  Lab 08/11/18 0057  08/12/18 0357 08/15/18 1003 08/16/18 1346  WBC 10.2 8.9 8.7 8.1  NEUTROABS 8.0*  --   --   --   HGB 9.8* 8.7* 7.1* 9.0*  HCT 29.3* 26.6* 20.7* 27.0*  MCV 107.3* 109.0* 106.7* 104.2*  PLT 90* 99* 128* 120*    Cardiac Enzymes: No results for input(s): CKTOTAL, CKMB, CKMBINDEX, TROPONINI in the last 168 hours.  BNP: Invalid input(s): POCBNP  CBG: Recent Labs  Lab 08/16/18 1143 08/16/18 1640 08/16/18 2336 08/17/18 0802 08/17/18 1155  GLUCAP 148* 106* 130* 128* 61*    Microbiology: Results for orders placed or performed during the hospital encounter of 10/11/17  MRSA PCR Screening     Status: None   Collection Time: 10/14/17  5:30 PM  Result Value Ref Range Status   MRSA by PCR NEGATIVE NEGATIVE Final    Comment:        The GeneXpert MRSA Assay (FDA  approved for NASAL specimens only), is one component of a comprehensive MRSA colonization surveillance program. It is not intended to diagnose MRSA infection nor to guide or monitor treatment for MRSA infections. Performed at [facility], [address] [place], [place] [address]     Coagulation Studies: Recent Labs    08/15/18 0959  LABPROT 14.7  INR 1.16    Urinalysis: No results for input(s): COLORURINE, LABSPEC, PHURINE, GLUCOSEU, HGBUR, BILIRUBINUR, KETONESUR, PROTEINUR, UROBILINOGEN, NITRITE, LEUKOCYTESUR in the last 72 hours.  Invalid input(s): APPERANCEUR    Imaging: No results found.   Medications:   . sodium chloride 10 mL/hr at 08/17/18 1428  . [MAR Hold]  ceFAZolin (ANCEF) IV    . [MAR Hold] clindamycin (CLEOCIN) IV     . [MAR Hold] sodium chloride   Intravenous Once  . [MAR Hold] sodium chloride   Intravenous Once  . [MAR Hold] carvedilol  25 mg Oral Q M,W,F-1800  . [MAR Hold] chlorhexidine  1 application Topical Once  . [MAR Hold] Chlorhexidine Gluconate Cloth  6 each Topical Q0600  . [MAR Hold] cholecalciferol  5,000 Units Oral Daily  . [MAR Hold] epoetin  (EPOGEN/PROCRIT) injection  10,000 Units Intravenous Q M,W,F-HD  . [MAR Hold] Ferrous Fumarate  1 tablet Oral Daily   And  . [MAR Hold] iron polysaccharides  150 mg Oral Daily  . [MAR Hold] fluconazole  150 mg Oral Once  . [MAR Hold] FLUoxetine  20 mg Oral Daily  . [MAR Hold] hydrALAZINE  50 mg Oral BID  . [MAR Hold] insulin aspart  0-15 Units Subcutaneous TID WC  . [MAR Hold] insulin aspart  0-5 Units Subcutaneous QHS  . [MAR Hold] irbesartan  300 mg Oral Daily  . [MAR Hold] isosorbide mononitrate  30 mg Oral QHS  . [MAR Hold] levothyroxine  75 mcg Oral QAC breakfast  . [MAR Hold] rosuvastatin  10 mg Oral QPM  . [MAR Hold] tranexamic acid (CYKLOKAPRON) topical -INTRAOP  2,000 mg Topical Once  . [MAR Hold] traZODone  50 mg Oral QHS   [MAR Hold] acetaminophen **OR** [MAR Hold] acetaminophen, [MAR Hold] albuterol, [MAR Hold] bisacodyl, [MAR Hold]  HYDROmorphone (DILAUDID) injection, [MAR Hold] ondansetron **OR** [MAR Hold] ondansetron (ZOFRAN) IV, [MAR Hold] senna-docusate, [MAR Hold] traMADol  Assessment/ Plan:  82 y.o. female with end-stage renal disease, coronary disease, carotid stenosis, history of stroke, diabetes, CABG, obstructive sleep apnea, history of renal artery stenosis, admitfed for right hip fracture 08/10/18.  [employer] [facility]/MWF/[facility]/99.5  1.  ESRD on HD MWF. Hemodialysis for after procedure   2.  Anemia of chronic kidney disease.  status post PRBC transfusion. Hemoglobin 9.  - EPO with HD treatment   3.  Hypertension: blood pressure at goal - carvedilol, hydralazine, irbesartan.  4. Secondary Hyperparathyroidism: PTH low at 100. Outpatient phosphorus and calcium at goal.  - not currently on binders.    LOS: 6 Dawn Bradford 12/13/20192:32 PM

## 2018-08-17 NOTE — Progress Notes (Addendum)
Per [facility] admissions coordinator at [facility] patient can come to [facility] over the weekend if medically stable. Patient's daughter [name] is aware of above.   [employer], LCSW [phone]

## 2018-08-17 NOTE — Op Note (Signed)
DATE OF SURGERY:  08/17/2018  TIME: 5:40 PM  PATIENT NAME:  Dawn Bradford  AGE: 82 y.o.  PRE-OPERATIVE DIAGNOSIS:  RIGHT HIP FRACTURE DISPLACED SHORTENED, INTERTROCHANTERIC  POST-OPERATIVE DIAGNOSIS:  SAME  PROCEDURE:  INTRAMEDULLARY (IM) NAIL INTERTROCHANTRIC WITH TROCHANTERIC FIXATION NAIL  SURGEON:  Andrea Ferrer E  ASST:  EBL:  010 cc  COMPLICATIONS:  NONE  OPERATIVE IMPLANTS: Synthes trochanteric femoral nail  10MM/130*  with interlocking helical blade  90 mm and distal locking screw  34 mm.  PREOPERATIVE INDICATIONS:  [name] is a 82 y.o. year old who fell and suffered a hip fracture. She was brought into the ER and then admitted and optimized and then elected for surgical intervention.    The risks benefits and alternatives were discussed with the patient including but not limited to the risks of nonoperative treatment, versus surgical intervention including infection, bleeding, nerve injury, malunion, nonunion, hardware prominence, hardware failure, need for hardware removal, blood clots, cardiopulmonary complications, morbidity, mortality, among others, and they were willing to proceed.    OPERATIVE PROCEDURE:  The patient was brought to the operating room and placed in the supine position.  Spinal anesthesia was administered. She was placed on the fracture table.  Closed reduction was performed under C-arm guidance. The length of the femur was also measured using fluoroscopy.  The femur was shortened due to the patient being in bed for a week prior to surgery.  Careful traction was applied to try and regain length.  Time out was then performed after sterile prep and drape. She received preoperative antibiotics.  Incision was made proximal to the greater trochanter. A guidewire was placed in the appropriate position. Confirmation was made on AP and lateral views. The above-named nail was opened. I opened the proximal femur with a reamer.  The above nail was passed down  the femoral canal but was would not seat fully as the canal was extremely narrow.  This was removed and a long reaming rod was inserted.  The canal was then reamed to 12 mm.  The femoral nail was then reinserted and seated fully.  Once the nail was completely seated, I placed a guidepin into the femoral head into the head through a second incision.  This was a very severe angle as the head and neck were dated posteriorly.  I measured the length, and then reamed the lateral cortex and up into the head. I then placed the helical blade. Slight compression was applied. I then secured the proximal interlock.  Fluoroscopy showed satisfactory position of the femoral component and the helical blade.  The distal locking screw was then placed and after confirming the position of the fracture fragments and hardware I then removed the instruments, and took final C-arm pictures AP and lateral the entire length of the leg.  The wounds were irrigated copiously and closed with Vicryl  followed by staples and dry sterile dressing. Sponge and needle count were correct.   The patient was awakened and returned to PACU in stable and satisfactory condition. There no complications and the patient tolerated the procedure well.  She will be partial weightbearing as tolerated, and will be on Lovenox  For DVT prophylaxis.     Park Breed, M.D.

## 2018-08-18 ENCOUNTER — Encounter
Admission: EM | Disposition: A | Discharge status: Skilled Nursing Facility | Payer: Self-pay | Source: Home / Self Care | Attending: Internal Medicine

## 2018-08-18 LAB — CBC
HEMATOCRIT: 23.1 % — AB (ref 36.0–46.0)
HEMOGLOBIN: 7.7 g/dL — AB (ref 12.0–15.0)
MCH: 35.3 pg — ABNORMAL HIGH (ref 26.0–34.0)
MCHC: 33.3 g/dL (ref 30.0–36.0)
MCV: 106 fL — ABNORMAL HIGH (ref 80.0–100.0)
Platelets: 129 10*3/uL — ABNORMAL LOW (ref 150–400)
RBC: 2.18 MIL/uL — ABNORMAL LOW (ref 3.87–5.11)
RDW: 15.5 % (ref 11.5–15.5)
WBC: 14.4 10*3/uL — ABNORMAL HIGH (ref 4.0–10.5)
nRBC: 0 % (ref 0.0–0.2)

## 2018-08-18 LAB — GLUCOSE, CAPILLARY
Glucose-Capillary: 129 mg/dL — ABNORMAL HIGH (ref 70–99)
Glucose-Capillary: 135 mg/dL — ABNORMAL HIGH (ref 70–99)
Glucose-Capillary: 113 mg/dL — ABNORMAL HIGH (ref 70–99)
Glucose-Capillary: 125 mg/dL — ABNORMAL HIGH (ref 70–99)

## 2018-08-18 LAB — BASIC METABOLIC PANEL
Anion gap: 8 (ref 5–15)
BUN: 25 mg/dL — ABNORMAL HIGH (ref 8–23)
CHLORIDE: 99 mmol/L (ref 98–111)
CO2: 27 mmol/L (ref 22–32)
Calcium: 7.9 mg/dL — ABNORMAL LOW (ref 8.9–10.3)
Creatinine, Ser: 2.26 mg/dL — ABNORMAL HIGH (ref 0.44–1.00)
GFR calc Af Amer: 23 mL/min — ABNORMAL LOW (ref >60)
GFR calc non Af Amer: 20 mL/min — ABNORMAL LOW (ref >60)
Glucose, Bld: 141 mg/dL — ABNORMAL HIGH (ref 70–99)
Potassium: 3.7 mmol/L (ref 3.5–5.1)
Sodium: 134 mmol/L — ABNORMAL LOW (ref 135–145)

## 2018-08-18 SURGERY — FIXATION, FRACTURE, INTERTROCHANTERIC, WITH INTRAMEDULLARY ROD
Anesthesia: Spinal | Laterality: Right

## 2018-08-18 MED ORDER — HYDROCODONE-ACETAMINOPHEN 5-325 MG PO TABS: 1.0000 | ORAL_TABLET | Freq: Four times a day (QID) | ORAL | Status: DC | PRN

## 2018-08-18 MED ORDER — METHOCARBAMOL 500 MG PO TABS
500.0000 mg | ORAL_TABLET | Freq: Three times a day (TID) | ORAL | Status: AC
  Administered 2018-08-18 – 2018-08-19 (×4): 500 mg via ORAL
  Filled 2018-08-18 (×6): qty 1

## 2018-08-18 MED ORDER — TRAMADOL HCL 50 MG PO TABS
50.0000 mg | ORAL_TABLET | Freq: Four times a day (QID) | ORAL | Status: DC
  Administered 2018-08-18 – 2018-08-20 (×4): 50 mg via ORAL
  Filled 2018-08-18 (×5): qty 1

## 2018-08-18 MED ORDER — HEPARIN SODIUM (PORCINE) 5000 UNIT/ML IJ SOLN
5000.0000 [IU] | Freq: Three times a day (TID) | INTRAMUSCULAR | Status: DC
  Administered 2018-08-18 – 2018-08-21 (×8): 5000 [IU] via SUBCUTANEOUS
  Filled 2018-08-18 (×8): qty 1

## 2018-08-18 MED ORDER — MIDODRINE HCL 5 MG PO TABS
5.0000 mg | ORAL_TABLET | Freq: Two times a day (BID) | ORAL | Status: DC
  Administered 2018-08-18 – 2018-08-20 (×5): 5 mg via ORAL
  Filled 2018-08-18 (×5): qty 1

## 2018-08-18 SURGICAL SUPPLY — 31 items
BNDG COHESIVE 4X5 TAN STRL (GAUZE/BANDAGES/DRESSINGS) IMPLANT
BRUSH SCRUB EZ  4% CHG (MISCELLANEOUS) ×4
BRUSH SCRUB EZ 4% CHG (MISCELLANEOUS) ×2 IMPLANT
CANISTER SUCT 1200ML W/VALVE (MISCELLANEOUS) ×3 IMPLANT
CHLORAPREP W/TINT 26ML (MISCELLANEOUS) ×3 IMPLANT
COVER WAND RF STERILE (DRAPES) ×3 IMPLANT
DRAPE SHEET LG 3/4 BI-LAMINATE (DRAPES) ×3 IMPLANT
DRAPE U-SHAPE 47X51 STRL (DRAPES) ×3 IMPLANT
DRSG OPSITE POSTOP 3X4 (GAUZE/BANDAGES/DRESSINGS) ×3 IMPLANT
DRSG OPSITE POSTOP 4X6 (GAUZE/BANDAGES/DRESSINGS) IMPLANT
DRSG OPSITE POSTOP 4X8 (GAUZE/BANDAGES/DRESSINGS) IMPLANT
ELECT REM PT RETURN 9FT ADLT (ELECTROSURGICAL) ×3
ELECTRODE REM PT RTRN 9FT ADLT (ELECTROSURGICAL) ×1 IMPLANT
GAUZE PETRO XEROFOAM 1X8 (MISCELLANEOUS) ×3 IMPLANT
GLOVE INDICATOR 8.0 STRL GRN (GLOVE) ×3 IMPLANT
GLOVE SURG ORTHO 8.0 STRL STRW (GLOVE) ×3 IMPLANT
GOWN STRL REUS W/ TWL LRG LVL3 (GOWN DISPOSABLE) ×1 IMPLANT
GOWN STRL REUS W/ TWL XL LVL3 (GOWN DISPOSABLE) ×1 IMPLANT
GOWN STRL REUS W/TWL LRG LVL3 (GOWN DISPOSABLE) ×2
GOWN STRL REUS W/TWL XL LVL3 (GOWN DISPOSABLE) ×2
KIT PATIENT CARE HANA TABLE (KITS) ×3 IMPLANT
KIT TURNOVER CYSTO (KITS) ×3 IMPLANT
MAT ABSORB  FLUID 56X50 GRAY (MISCELLANEOUS) ×2
MAT ABSORB FLUID 56X50 GRAY (MISCELLANEOUS) ×1 IMPLANT
NS IRRIG 1000ML POUR BTL (IV SOLUTION) ×3 IMPLANT
PACK HIP COMPR (MISCELLANEOUS) ×3 IMPLANT
STAPLER SKIN PROX 35W (STAPLE) ×3 IMPLANT
SUT VIC AB 0 CT1 36 (SUTURE) ×3 IMPLANT
SUT VIC AB 2-0 CT1 27 (SUTURE) ×2
SUT VIC AB 2-0 CT1 TAPERPNT 27 (SUTURE) ×1 IMPLANT
TOWEL OR 17X26 4PK STRL BLUE (TOWEL DISPOSABLE) ×3 IMPLANT

## 2018-08-18 NOTE — Progress Notes (Signed)
Post HD assessment. Pt did not tolerate tx well. Pt experienced a drop in BP requiring the UF to be turned off, MD aware. Pt remained stable and non-symptomatic throughout HD tx.  Pt given tylenol for c/o pain r/t hip surgery earlier today. Net UF -75, goal not met.    08/18/18 0015  Vital Signs  Temp (!) 97.5 F (36.4 C)  Temp Source Oral  Pulse Rate 80  Pulse Rate Source Monitor  Resp (!) 23  BP (!) 87/40  BP Location Right Arm  BP Method Automatic  Patient Position (if appropriate) Lying  Oxygen Therapy  SpO2 100 %  O2 Device Nasal Cannula  O2 Flow Rate (L/min) 2 L/min  Dialysis Weight  Weight 122.1 kg  Type of Weight Post-Dialysis  Post-Hemodialysis Assessment  Rinseback Volume (mL) 250 mL  KECN 64.4 V  Dialyzer Clearance Lightly streaked  Duration of HD Treatment -hour(s) 3 hour(s)  Hemodialysis Intake (mL) 500 mL  UF Total -Machine (mL) 425 mL  Net UF (mL) -75 mL  Tolerated HD Treatment No (Comment)  AVG/AVF Arterial Site Held (minutes) 10 minutes  AVG/AVF Venous Site Held (minutes) 10 minutes  Education / Care Plan  Dialysis Education Provided Yes  Documented Education in Care Plan Yes  Fistula / Graft Left Forearm Arteriovenous fistula  No Placement Date or Time found.   Placed prior to admission: Yes  Orientation: Left  Access Location: Forearm  Access Type: Arteriovenous fistula  Site Condition No complications  Fistula / Graft Assessment Present;Thrill;Bruit  Status Deaccessed  Drainage Description None

## 2018-08-18 NOTE — Anesthesia Postprocedure Evaluation (Signed)
Anesthesia Post Note  Patient: Hend Mccarrell Edick  Procedure(s) Performed: INTRAMEDULLARY (IM) NAIL INTERTROCHANTRIC (Right )  Patient location during evaluation: Nursing Unit Anesthesia Type: Spinal Level of consciousness: oriented and awake and alert Pain management: pain level controlled Vital Signs Assessment: post-procedure vital signs reviewed and stable Respiratory status: spontaneous breathing Cardiovascular status: blood pressure returned to baseline and stable Postop Assessment: no headache, no backache, no apparent nausea or vomiting and patient able to bend at knees Anesthetic complications: no     Last Vitals:  Vitals:   08/18/18 0604 08/18/18 0804  BP: (!) 157/123 (!) 138/44  Pulse: 100 89  Resp:  20  Temp:  36.7 C  SpO2:  99%    Last Pain:  Vitals:   08/18/18 0804  TempSrc: Oral  PainSc:                  Precious Haws Diego Delancey

## 2018-08-18 NOTE — Progress Notes (Signed)
Patient had some breakthrough bleed at her surgical site. Dressing reinforced. MD notified. Instructed to place ace wrap to site by MD.

## 2018-08-18 NOTE — Progress Notes (Signed)
[facility]  ROUNDING NOTE   Subjective:   Hemodialysis treatment yesterday after surgery. Tolerated treatment well.   Daughter at bedside this morning.   Patient worked with PT this morning  Objective:  Vital signs in last 24 hours:  Temp:  [97 F (36.1 C)-98 F (36.7 C)] 98 F (36.7 C) (12/14 0804) Pulse Rate:  [76-105] 105 (12/14 1125) Resp:  [13-35] 20 (12/14 0804) BP: (73-157)/(33-123) 131/42 (12/14 0945) SpO2:  [90 %-100 %] 95 % (12/14 1125) Weight:  [122 kg-122.1 kg] 122.1 kg (12/14 0015)  Weight change:  Filed Weights   08/15/18 1811 08/17/18 2054 08/18/18 0015  Weight: 122 kg 122 kg 122.1 kg    Intake/Output: I/O last 3 completed shifts: In: 151.2 [I.V.:1.2; IV Piggyback:150] Out: 325 [Blood:400]   Intake/Output this shift:  No intake/output data recorded.  Physical Exam: General: No acute distress  Head: Normocephalic, atraumatic. Moist oral mucosal membranes  Eyes: Anicteric  Neck: Supple, trachea midline  Lungs:  Clear to auscultation, normal effort  Heart: S1S2 no rubs  Abdomen:  Soft, nontender, bowel sounds present  Extremities: No peripheral edema.  Neurologic: Awake, alert, following commands  Skin: No lesions  Access: LUE AVF    Basic Metabolic Panel: Recent Labs  Lab 08/12/18 0357 08/13/18 0333 08/13/18 1206 08/15/18 0959 08/15/18 1300 08/17/18 1000 08/17/18 2107 08/18/18 0527  NA 134* 132*  --  132*  --   --  130* 134*  K 4.1 3.8  --  3.5  --  3.6 3.7 3.7  CL 98 97*  --  96*  --   --  97* 99  CO2 29 28  --  29  --   --  23 27  GLUCOSE 131* 132*  --  144*  --   --  158* 141*  BUN 27* 37*  --  37*  --   --  40* 25*  CREATININE 3.83* 4.39*  --  3.31*  --   --  3.22* 2.26*  CALCIUM 8.5* 8.3*  --  8.4*  --   --  8.3* 7.9*  PHOS  --   --  4.1  --  2.5  --  3.6  --     Liver Function Tests: Recent Labs  Lab 08/15/18 1300 08/17/18 2107  ALBUMIN 2.6* 2.8*   No results for input(s): LIPASE, AMYLASE in the last 168  hours. No results for input(s): AMMONIA in the last 168 hours.  CBC: Recent Labs  Lab 08/12/18 0357 08/15/18 1003 08/16/18 1346 08/17/18 1438 08/17/18 1831 08/17/18 2107 08/18/18 0527  WBC 8.9 8.7 8.1  --   --  18.1* 14.4*  HGB 8.7* 7.1* 9.0*  --  8.5* 8.4* 7.7*  HCT 26.6* 20.7* 27.0*  --  25.8* 25.1* 23.1*  MCV 109.0* 106.7* 104.2*  --   --  103.7* 106.0*  PLT 99* 128* 120* 143*  --  153 129*    Cardiac Enzymes: No results for input(s): CKTOTAL, CKMB, CKMBINDEX, TROPONINI in the last 168 hours.  BNP: Invalid input(s): POCBNP  CBG: Recent Labs  Lab 08/17/18 0802 08/17/18 1155 08/17/18 1857 08/18/18 0804 08/18/18 1134  GLUCAP 128* 123* 127* 129* 135*    Microbiology: Results for orders placed or performed during the hospital encounter of 10/11/17  MRSA PCR Screening     Status: None   Collection Time: 10/14/17  5:30 PM  Result Value Ref Range Status   MRSA by PCR NEGATIVE NEGATIVE Final    Comment:  The GeneXpert MRSA Assay (FDA approved for NASAL specimens only), is one component of a comprehensive MRSA colonization surveillance program. It is not intended to diagnose MRSA infection nor to guide or monitor treatment for MRSA infections. Performed at [facility], [address] [place], [place] [address]     Coagulation Studies: No results for input(s): LABPROT, INR in the last 72 hours.  Urinalysis: No results for input(s): COLORURINE, LABSPEC, PHURINE, GLUCOSEU, HGBUR, BILIRUBINUR, KETONESUR, PROTEINUR, UROBILINOGEN, NITRITE, LEUKOCYTESUR in the last 72 hours.  Invalid input(s): APPERANCEUR    Imaging: Dg Hip Operative Unilat W Or W/o Pelvis Right  Result Date: 08/17/2018 CLINICAL DATA:  Intramedullary nail placement for intertrochanteric femur fracture. EXAM: OPERATIVE RIGHT HIP (WITH PELVIS IF PERFORMED)  VIEWS TECHNIQUE: Fluoroscopic spot image(s) were submitted for interpretation post-operatively. COMPARISON:  Radiographs  08/11/2018. FINDINGS: Five spot fluoroscopic images demonstrate the placement of a right femoral intramedullary nail secured by 1 visible distal interlocking screw. The tip of the intramedullary nail is not visualized. The alignment of the intertrochanteric fracture appears improved. No complications are identified. IMPRESSION: Intraoperative views during ORIF of intertrochanteric right femur fracture. The distal end of the intramedullary nail is not visualized. Electronically Signed   By: Richardean Sale M.D.   On: 08/17/2018 17:40     Medications:   . sodium chloride 50 mL/hr at 08/17/18 2035  .  ceFAZolin (ANCEF) IV 2 g (08/18/18 0823)  . clindamycin (CLEOCIN) IV 600 mg (08/18/18 0948)   . sodium chloride   Intravenous Once  . sodium chloride   Intravenous Once  . carvedilol  25 mg Oral Q M,W,F-1800  . chlorhexidine  1 application Topical Once  . Chlorhexidine Gluconate Cloth  6 each Topical Q0600  . cholecalciferol  5,000 Units Oral Daily  . epoetin (EPOGEN/PROCRIT) injection  10,000 Units Intravenous Q M,W,F-HD  . Ferrous Fumarate  1 tablet Oral Daily   And  . iron polysaccharides  150 mg Oral Daily  . ferrous sulfate  325 mg Oral Q breakfast  . fluconazole  150 mg Oral Once  . FLUoxetine  20 mg Oral Daily  . heparin injection (subcutaneous)  5,000 Units Subcutaneous Q8H  . hydrALAZINE  50 mg Oral BID  . insulin aspart  0-15 Units Subcutaneous TID WC  . insulin aspart  0-5 Units Subcutaneous QHS  . irbesartan  300 mg Oral Daily  . isosorbide mononitrate  30 mg Oral QHS  . levothyroxine  75 mcg Oral QAC breakfast  . methocarbamol  500 mg Oral TID  . midodrine  5 mg Oral BID WC  . rosuvastatin  10 mg Oral QPM  . senna  1 tablet Oral BID  . traMADol  50 mg Oral Q6H  . tranexamic acid (CYKLOKAPRON) topical -INTRAOP  2,000 mg Topical Once  . traZODone  50 mg Oral QHS   acetaminophen **OR** acetaminophen, albuterol, alum & mag hydroxide-simeth, bisacodyl, bisacodyl,  HYDROcodone-acetaminophen, magnesium hydroxide, menthol-cetylpyridinium **OR** phenol, metoCLOPramide **OR** metoCLOPramide (REGLAN) injection, ondansetron **OR** ondansetron (ZOFRAN) IV, senna-docusate, sodium phosphate, zolpidem  Assessment/ Plan:  82 y.o. female with end-stage renal disease, coronary disease, carotid stenosis, history of stroke, diabetes, CABG, obstructive sleep apnea, history of renal artery stenosis, admitfed for right hip fracture 08/10/18.  [employer] [facility]/MWF/[facility]/99.5  1.  ESRD on HD MWF. Hemodialysis for Monday.   2.  Anemia of chronic kidney disease.  status post PRBC transfusion 12/11 Hemoglobin 7.7 - EPO with HD treatments  3.  Hypertension: blood pressure at goal - carvedilol,  hydralazine, irbesartan.  4. Secondary Hyperparathyroidism: PTH low at 100. Outpatient phosphorus and calcium at goal.  - not currently on binders.    LOS: 7 Dawn Bradford 12/14/20191:02 PM

## 2018-08-18 NOTE — Progress Notes (Signed)
Physical Therapy Treatment Patient Details Name: Dawn Bradford MRN: [id number] DOB: 1936/07/18 Today's Date: 08/18/2018    History of Present Illness Dawn Bradford is an 82yo female who comes to [facility] after sustaining a fall, found to have Rt hip fracture, now s/p Rt hip ORIF c IM rod. PMH: CHF, COPD, CAD, ESRD, DM, stents s/p MI, ESRD on HD MWF. Pt had 1 unit PRBC prior to surgery.     PT Comments    Pt in bed finishing up diaper change with RN upon entry, agreeable to PT. Pt /DTR report good amount of rest since AM session. Pt participated in LLE therex, moderate to high level effort to complete A/ROM, and min resistance with Lt leg press. RLE is dramatically different, pt sobbing intermittently d/t pain, despite modA given to reduce weight of leg. Pt  Puts forth incredible effort, but pain is unfortunately limiting to ROM and tolerance of reps >5. Progress remains slow with high pain levels overall. Author thinking patient is somewhat underrating pain, which is actually offered by DTR at end of session. She rates a 6/10, appears closer to 8-9 at times.    Follow Up Recommendations  SNF;Supervision - Intermittent;Supervision for mobility/OOB     Equipment Recommendations  None recommended by PT    Recommendations for Other Services       Precautions / Restrictions Precautions Precautions: Fall Restrictions RLE Weight Bearing: Partial weight bearing    Mobility  Bed Mobility               General bed mobility comments: not pursued at this time d/t high pain and orthostasis earlier today  Transfers                    Ambulation/Gait                 Stairs             Wheelchair Mobility    Modified Rankin (Stroke Patients Only)       Balance                                            Cognition Arousal/Alertness: Awake/alert Behavior During Therapy: WFL for tasks assessed/performed Overall Cognitive Status: Within  Functional Limits for tasks assessed                                        Exercises General Exercises - Lower Extremity Ankle Circles/Pumps: AROM;Both;10 reps Short Arc Quad: Strengthening;Left;15 reps;Supine Heel Slides: AAROM;10 reps;Supine;15 reps;Right;Left Hip ABduction/ADduction: AAROM;Supine;10 reps;15 reps Hip Flexion/Marching: AROM;Left;10 reps;Supine Mini-Sqauts: Supine;Strengthening;Left;15 reps    General Comments        Pertinent Vitals/Pain Pain Assessment: Faces Pain Score: 6  Faces Pain Scale: Hurts whole lot(8-9 when doing exeercises in bed, tearful and crying with movement) Pain Location: Rt leg at end of session Pain Intervention(s): Limited activity within patient's tolerance;Monitored during session;Premedicated before session;Repositioned    Home Living                      Prior Function            PT Goals (current goals can now be found in the care plan section) Acute Rehab PT Goals Patient Stated Goal: Return  to PLOF in mobility with less pain limitation  PT Goal Formulation: With patient Time For Goal Achievement: 09/01/18 Progress towards PT goals: Progressing toward goals    Frequency    BID      PT Plan Current plan remains appropriate    Co-evaluation              AM-PAC PT "6 Clicks" Mobility   Outcome Measure  Help needed turning from your back to your side while in a flat bed without using bedrails?: Total Help needed moving from lying on your back to sitting on the side of a flat bed without using bedrails?: Total Help needed moving to and from a bed to a chair (including a wheelchair)?: Total Help needed standing up from a chair using your arms (e.g., wheelchair or bedside chair)?: Total Help needed to walk in hospital room?: Total Help needed climbing 3-5 steps with a railing? : Total 6 Click Score: 6    End of Session Equipment Utilized During Treatment: Oxygen Activity Tolerance:  Patient limited by pain Patient left: in bed;with family/visitor present;with call bell/phone within reach Nurse Communication: Mobility status(persistently high pain levels with activity) PT Visit Diagnosis: Other abnormalities of gait and mobility (R26.89);Muscle weakness (generalized) (M62.81);Difficulty in walking, not elsewhere classified (R26.2)     Time: 1543-1600 PT Time Calculation (min) (ACUTE ONLY): 17 min  Charges:  $Therapeutic Exercise: 8-22 mins                     4:10 PM, 08/18/18 Dawn Bradford, PT, DPT Physical Therapist - [facility]  [phone] ([place])    Dawn Bradford C 08/18/2018, 4:08 PM

## 2018-08-18 NOTE — Progress Notes (Signed)
[facility] [place] NAME: Dawn Bradford    MR#:  [id number]  DATE OF BIRTH:  23-Apr-1936  SUBJECTIVE:  CHIEF COMPLAINT:   Chief Complaint  Patient presents with  . Fall   -Feels weak and tired. -Postop day 1 after right hip surgery, had some bleeding at surgical site that is reinforced with dressing. -Had dialysis last night  REVIEW OF SYSTEMS:  Review of Systems  Constitutional: Positive for malaise/fatigue. Negative for chills and fever.  HENT: Negative for ear discharge, hearing loss and nosebleeds.   Eyes: Negative for blurred vision and double vision.  Respiratory: Positive for shortness of breath. Negative for cough and wheezing.   Cardiovascular: Negative for chest pain, palpitations and leg swelling.  Gastrointestinal: Negative for abdominal pain, constipation, diarrhea, nausea and vomiting.  Genitourinary: Negative for dysuria.  Musculoskeletal: Positive for joint pain and myalgias.  Neurological: Negative for dizziness, focal weakness, seizures, weakness and headaches.  Psychiatric/Behavioral: Negative for depression.    DRUG ALLERGIES:   Allergies  Allergen Reactions  . Ace Inhibitors Other (See Comments)    Reaction:  Unknown   . Amlodipine Other (See Comments)    Reaction:  Unknown   . Clonidine Hydrochloride Other (See Comments)    Reaction:  Unknown   . Codeine Hives  . Latex Hives  . Metformin Nausea And Vomiting  . Tape Rash    VITALS:  Blood pressure (!) 131/42, pulse 92, temperature 98 F (36.7 C), temperature source Oral, resp. rate 20, height 5\' 3"  (1.6 m), weight 122.1 kg, last menstrual period 02/01/1986, SpO2 99 %.  PHYSICAL EXAMINATION:  Physical Exam  GENERAL:  82 y.o.-year-old elderly  patient lying in the bed with no acute distress.  EYES: Pupils equal, round, reactive to light and accommodation. No scleral icterus. Extraocular muscles intact.  HEENT: Head atraumatic, normocephalic.  Oropharynx and nasopharynx clear.  NECK:  Supple, no jugular venous distention. No thyroid enlargement, no tenderness.  LUNGS: Normal breath sounds bilaterally, no wheezing, rales,rhonchi or crepitation. No use of accessory muscles of respiration.  Creased bibasilar breath sounds CARDIOVASCULAR: S1, S2 normal. No rubs, or gallops.  2/6 systolic murmur is present ABDOMEN: Soft, nontender, nondistended. Bowel sounds present. No organomegaly or mass.  EXTREMITIES: Right hip dressing in place.  No pedal edema, cyanosis, or clubbing.  Left forearm AV fistula NEUROLOGIC: Cranial nerves II through XII are intact. Muscle strength 5/5 in all extremities. Sensation intact. Gait not checked.  Will weakness noted PSYCHIATRIC: The patient is alert and oriented x 3.  SKIN: No obvious rash, lesion, or ulcer.    LABORATORY PANEL:   CBC Recent Labs  Lab 08/18/18 0527  WBC 14.4*  HGB 7.7*  HCT 23.1*  PLT 129*   ------------------------------------------------------------------------------------------------------------------  Chemistries  Recent Labs  Lab 08/18/18 0527  NA 134*  K 3.7  CL 99  CO2 27  GLUCOSE 141*  BUN 25*  CREATININE 2.26*  CALCIUM 7.9*   ------------------------------------------------------------------------------------------------------------------  Cardiac Enzymes No results for input(s): TROPONINI in the last 168 hours. ------------------------------------------------------------------------------------------------------------------  RADIOLOGY:  Dg Hip Operative Unilat W Or W/o Pelvis Right  Result Date: 08/17/2018 CLINICAL DATA:  Intramedullary nail placement for intertrochanteric femur fracture. EXAM: OPERATIVE RIGHT HIP (WITH PELVIS IF PERFORMED)  VIEWS TECHNIQUE: Fluoroscopic spot image(s) were submitted for interpretation post-operatively. COMPARISON:  Radiographs 08/11/2018. FINDINGS: Five spot fluoroscopic images demonstrate the placement of a right femoral  intramedullary nail secured by 1 visible distal interlocking screw. The tip of  the intramedullary nail is not visualized. The alignment of the intertrochanteric fracture appears improved. No complications are identified. IMPRESSION: Intraoperative views during ORIF of intertrochanteric right femur fracture. The distal end of the intramedullary nail is not visualized. Electronically Signed   By: Richardean Sale M.D.   On: 08/17/2018 17:40    EKG:   Orders placed or performed during the hospital encounter of 08/10/18  . ED EKG  . ED EKG  . EKG 12-Lead  . EKG 12-Lead  . EKG 12-Lead  . EKG 12-Lead  . EKG 12-Lead  . EKG 12-Lead    ASSESSMENT AND PLAN:   82 year old female with past medical history significant for CAD status post CABG, sleep apnea, carotid disease, history of stroke, end-stage renal disease on Monday Wednesday Friday hemodialysis, history of renal artery stenosis admitted after a fall and right hip fracture.  1.  Right hip fracture-patient had a mechanical fall with right intertrochanteric femoral neck fracture. -Status post surgery on 08/17/2018.  Postop day 1 today -Continue with pain control, physical therapy -Appreciate orthopedics consult  2.  Anemia of chronic disease-received 1 unit transfusion with dialysis prior to surgery.  Hemoglobin still low at 7.7 -No active bleeding, bleeding from the surgical site improved after reinforce dressing -Monitor for more transfusion need -Started on Neupogen with dialysis.  Also on iron supplements  3.  End-stage renal disease on hemodialysis-appreciate nephrology consult.  Patient on Monday, Wednesday and Friday dialysis schedule.  Last dialysis post surgery on 08/17/2018.  Monitor for now  4.  Hypertension-on Coreg, hydralazine and irbesartan  5.  DVT prophylaxis-we will start subcutaneous   Updated daughter at bedside    All the records are reviewed and case discussed with Care Management/Social Workerr. Management  plans discussed with the patient, family and they are in agreement.  CODE STATUS: Full code  TOTAL TIME TAKING CARE OF THIS PATIENT: 37 minutes.   POSSIBLE D/C IN 2-3 DAYS, DEPENDING ON CLINICAL CONDITION.   Gladstone Lighter M.D on 08/18/2018 at 11:35 AM  Between 7am to 6pm - Pager - [phone]  After 6pm go to [email or web address] - password [email or web address] [facility]  Office  [phone]  CC: Primary care physician; Hortencia Pilar, MD

## 2018-08-18 NOTE — Progress Notes (Signed)
HD tx end    08/18/18 0009  Vital Signs  Pulse Rate 84  Pulse Rate Source Monitor  Resp (!) 35  BP (!) 85/47  BP Location Right Arm  BP Method Automatic  Patient Position (if appropriate) Lying  Oxygen Therapy  SpO2 99 %  O2 Device Nasal Cannula  O2 Flow Rate (L/min) 2 L/min  During Hemodialysis Assessment  Dialysis Fluid Bolus Normal Saline  Bolus Amount (mL) 250 mL  Intra-Hemodialysis Comments Tx completed

## 2018-08-18 NOTE — Evaluation (Signed)
Physical Therapy Evaluation Patient Details Name: Dawn Bradford MRN: [id number] DOB: 04/17/1936 Today's Date: 08/18/2018   History of Present Illness  Dawn Bradford is an 82yo female who comes to [facility] after sustaining a fall, found to have Rt hip fracture, now s/p Rt hip ORIF c IM rod. PMH: CHF, COPD, CAD, ESRD, DM, stents s/p MI, ESRD on HD MWF. Pt had 1 unit PRBC prior to surgery.   Clinical Impression  Pt admitted with above diagnosis. Pt currently with functional limitations due to the deficits listed below (see "PT Problem List"). Upon entry, pt in bed, DTR Benjamine Mola present, provides HPI, as pt is quite HOH and somnolent. The pt is awake and agreeable to participate, pain well managed at rest, but rapid increase with bed exercises and bed mobility. Note most recent Hb: 7.7, HCT: 23.1 both slightly lower than baseline anemia, Hb: 9.0 earlier in week after 1 unit PRBC. Max+2 to EOB, pt attempting to participate, but weakness is limiting, sitting at EOB x 5 minutes gradually worsens in tolerance as pt is noted to be orthostatic sitting, immediately at 121/49mmHg, then when rechecked 116/40mmHG. Pt is moved back to supine with assist from daughter, then nursing arrives to reposition patient in bed for comfort. Bed exercises requiring MaxA for limb movement and guarding heavily limiting tolerated ROM. Despite pain and BP issues, pt remains diligent and motivated. Functional mobility assessment demonstrates increased effort/time requirements, poor tolerance, and need for heavy physical assistance, whereas the patient performed these at a higher level of independence PTA. Proximal surgical dressing dripping bright blood onto floor while sitting, cleaned and RN informed. Pt will benefit from skilled PT intervention to increase independence and safety with basic mobility in preparation for discharge to the venue listed below.     08/18/18 1125  Therapy Vitals  Pulse Rate (!) 105  Patient Position (if  appropriate) Orthostatic Vitals  Orthostatic Lying   BP- Lying 146/66  Orthostatic Sitting  BP- Sitting 116/47 ((sitting at 3 minutes) )  Oxygen Therapy  SpO2 95 %  O2 Device Nasal Cannula  O2 Flow Rate (L/min) 2 L/min       Follow Up Recommendations SNF;Supervision - Intermittent;Supervision for mobility/OOB    Equipment Recommendations  None recommended by PT    Recommendations for Other Services       Precautions / Restrictions Precautions Precautions: Fall Restrictions Weight Bearing Restrictions: No RLE Weight Bearing: Partial weight bearing      Mobility  Bed Mobility Overal bed mobility: Needs Assistance Bed Mobility: Supine to Sit;Sit to Supine     Supine to sit: Max assist;+2 for physical assistance;HOB elevated Sit to supine: Max assist;+2 for physical assistance;+2 for safety/equipment      Transfers Overall transfer level: (not attempted; pt orthostatic afte rmoving to EOB)                  Ambulation/Gait                Stairs            Wheelchair Mobility    Modified Rankin (Stroke Patients Only)       Balance Overall balance assessment: Needs assistance Sitting-balance support: Single extremity supported;Feet supported Sitting balance-Leahy Scale: Fair                                       Pertinent Vitals/Pain Pain Assessment: 0-10 Pain  Score: 8  Pain Location: Rt leg at end of session Pain Intervention(s): Limited activity within patient's tolerance    Home Living Family/patient expects to be discharged to:: Private residence Living Arrangements: Spouse/significant other Available Help at Discharge: Family;Available 24 hours/day;Personal care attendant(has paid caregivers in home M-F 9-5 and 9-12 on weekend) Type of Home: House Home Access: Stairs to enter   [employer] of Steps: 1 into the kitchen from the laundry room  Home Layout: One level Home Equipment: [place] - 2  wheels;Cane - single point;Wheelchair - [employer];Shower seat Additional Comments: Pt in on 2L O2 at home (dtr thinks it's ad lib).     Prior Function Level of Independence: Needs assistance   Gait / Transfers Assistance Needed: mostly household distance AMB c RW, but could walk into hair appointments and dialysis 3x week     Comments: home with husband     Hand Dominance   Dominant Hand: Right    Extremity/Trunk Assessment   Upper Extremity Assessment Upper Extremity Assessment: Generalized weakness    Lower Extremity Assessment Lower Extremity Assessment: Generalized weakness       Communication   Communication: HOH  Cognition Arousal/Alertness: Awake/alert(intermittently somnolent) Behavior During Therapy: WFL for tasks assessed/performed Overall Cognitive Status: Within Functional Limits for tasks assessed                                        General Comments      Exercises General Exercises - Lower Extremity Ankle Circles/Pumps: AROM;Both;10 reps Heel Slides: AAROM;10 reps;Supine Hip ABduction/ADduction: AAROM;Supine;10 reps   Assessment/Plan    PT Assessment Patient needs continued PT services  PT Problem List Decreased strength;Decreased range of motion;Decreased activity tolerance;Decreased mobility;Obesity       PT Treatment Interventions Therapeutic exercise;Gait training;Therapeutic activities;Functional mobility training;Balance training;Patient/family education    PT Goals (Current goals can be found in the Care Plan section)  Acute Rehab PT Goals Patient Stated Goal: Return to PLOF in mobility with less pain limitation  PT Goal Formulation: With patient Time For Goal Achievement: 09/01/18    Frequency BID   Barriers to discharge        Co-evaluation               AM-PAC PT "6 Clicks" Mobility  Outcome Measure Help needed turning from your back to your side while in a flat bed without using  bedrails?: Total Help needed moving from lying on your back to sitting on the side of a flat bed without using bedrails?: Total Help needed moving to and from a bed to a chair (including a wheelchair)?: Total Help needed standing up from a chair using your arms (e.g., wheelchair or bedside chair)?: Total Help needed to walk in hospital room?: Total Help needed climbing 3-5 steps with a railing? : Total 6 Click Score: 6    End of Session Equipment Utilized During Treatment: Oxygen Activity Tolerance: Patient tolerated treatment well Patient left: in bed;with nursing/sitter in room;with family/visitor present Nurse Communication: Mobility status(orthostatic vitals) PT Visit Diagnosis: Other abnormalities of gait and mobility (R26.89);Muscle weakness (generalized) (M62.81);Difficulty in walking, not elsewhere classified (R26.2)    Time: 0865-7846 PT Time Calculation (min) (ACUTE ONLY): 52 min   Charges:   PT Evaluation $PT Eval High Complexity: 1 High PT Treatments $Therapeutic Exercise: 8-22 mins $Therapeutic Activity: 8-22 mins        12:12 PM, 08/18/18 [place]  Kalman Drape, PT, DPT Physical Therapist - [facility]  [phone] ([place])  ' Rheanne Cortopassi C 08/18/2018, 12:05 PM

## 2018-08-18 NOTE — Progress Notes (Signed)
Subjective:  Patient reports pain as moderate.  Was able to sit up at side of bed today.  Objective:   VITALS:   Vitals:   08/18/18 0058 08/18/18 0604 08/18/18 0804 08/18/18 0945  BP: (!) 108/38 (!) 157/123 (!) 138/44 (!) 131/42  Pulse: 82 100 89 92  Resp: 18  20   Temp:   98 F (36.7 C)   TempSrc:   Oral   SpO2: 98%  99%   Weight:      Height:        PHYSICAL EXAM:  Neurovascular intact Incision: moderate drainage Compartment soft  LABS  Results for orders placed or performed during the hospital encounter of 08/10/18 (from the past 24 hour(s))  Platelet count     Status: Abnormal   Collection Time: 08/17/18  2:38 PM  Result Value Ref Range   Platelets 143 (L) 150 - 400 K/uL  Hemoglobin and hematocrit, blood     Status: Abnormal   Collection Time: 08/17/18  6:31 PM  Result Value Ref Range   Hemoglobin 8.5 (L) 12.0 - 15.0 g/dL   HCT 25.8 (L) 36.0 - 46.0 %  Glucose, capillary     Status: Abnormal   Collection Time: 08/17/18  6:57 PM  Result Value Ref Range   Glucose-Capillary 127 (H) 70 - 99 mg/dL   Comment 1 Notify RN    Comment 2 Document in Chart   Renal function panel     Status: Abnormal   Collection Time: 08/17/18  9:07 PM  Result Value Ref Range   Sodium 130 (L) 135 - 145 mmol/L   Potassium 3.7 3.5 - 5.1 mmol/L   Chloride 97 (L) 98 - 111 mmol/L   CO2 23 22 - 32 mmol/L   Glucose, Bld 158 (H) 70 - 99 mg/dL   BUN 40 (H) 8 - 23 mg/dL   Creatinine, Ser 3.22 (H) 0.44 - 1.00 mg/dL   Calcium 8.3 (L) 8.9 - 10.3 mg/dL   Phosphorus 3.6 2.5 - 4.6 mg/dL   Albumin 2.8 (L) 3.5 - 5.0 g/dL   GFR calc non Af Amer 13 (L) >60 mL/min   GFR calc Af Amer 15 (L) >60 mL/min   Anion gap 10 5 - 15  CBC     Status: Abnormal   Collection Time: 08/17/18  9:07 PM  Result Value Ref Range   WBC 18.1 (H) 4.0 - 10.5 K/uL   RBC 2.42 (L) 3.87 - 5.11 MIL/uL   Hemoglobin 8.4 (L) 12.0 - 15.0 g/dL   HCT 25.1 (L) 36.0 - 46.0 %   MCV 103.7 (H) 80.0 - 100.0 fL   MCH 34.7 (H) 26.0 -  34.0 pg   MCHC 33.5 30.0 - 36.0 g/dL   RDW 15.0 11.5 - 15.5 %   Platelets 153 150 - 400 K/uL   nRBC 0.0 0.0 - 0.2 %  CBC     Status: Abnormal   Collection Time: 08/18/18  5:27 AM  Result Value Ref Range   WBC 14.4 (H) 4.0 - 10.5 K/uL   RBC 2.18 (L) 3.87 - 5.11 MIL/uL   Hemoglobin 7.7 (L) 12.0 - 15.0 g/dL   HCT 23.1 (L) 36.0 - 46.0 %   MCV 106.0 (H) 80.0 - 100.0 fL   MCH 35.3 (H) 26.0 - 34.0 pg   MCHC 33.3 30.0 - 36.0 g/dL   RDW 15.5 11.5 - 15.5 %   Platelets 129 (L) 150 - 400 K/uL   nRBC 0.0 0.0 - 0.2 %  Basic metabolic panel     Status: Abnormal   Collection Time: 08/18/18  5:27 AM  Result Value Ref Range   Sodium 134 (L) 135 - 145 mmol/L   Potassium 3.7 3.5 - 5.1 mmol/L   Chloride 99 98 - 111 mmol/L   CO2 27 22 - 32 mmol/L   Glucose, Bld 141 (H) 70 - 99 mg/dL   BUN 25 (H) 8 - 23 mg/dL   Creatinine, Ser 2.26 (H) 0.44 - 1.00 mg/dL   Calcium 7.9 (L) 8.9 - 10.3 mg/dL   GFR calc non Af Amer 20 (L) >60 mL/min   GFR calc Af Amer 23 (L) >60 mL/min   Anion gap 8 5 - 15  Glucose, capillary     Status: Abnormal   Collection Time: 08/18/18  8:04 AM  Result Value Ref Range   Glucose-Capillary 129 (H) 70 - 99 mg/dL  Glucose, capillary     Status: Abnormal   Collection Time: 08/18/18 11:34 AM  Result Value Ref Range   Glucose-Capillary 135 (H) 70 - 99 mg/dL    Dg Hip Operative Unilat W Or W/o Pelvis Right  Result Date: 08/17/2018 CLINICAL DATA:  Intramedullary nail placement for intertrochanteric femur fracture. EXAM: OPERATIVE RIGHT HIP (WITH PELVIS IF PERFORMED)  VIEWS TECHNIQUE: Fluoroscopic spot image(s) were submitted for interpretation post-operatively. COMPARISON:  Radiographs 08/11/2018. FINDINGS: Five spot fluoroscopic images demonstrate the placement of a right femoral intramedullary nail secured by 1 visible distal interlocking screw. The tip of the intramedullary nail is not visualized. The alignment of the intertrochanteric fracture appears improved. No complications are  identified. IMPRESSION: Intraoperative views during ORIF of intertrochanteric right femur fracture. The distal end of the intramedullary nail is not visualized. Electronically Signed   By: Richardean Sale M.D.   On: 08/17/2018 17:40    Assessment/Plan: 1 Day Post-Op   Active Problems:   Closed intertrochanteric fracture of hip, right, initial encounter (Dawn Bradford)   Up with therapy Discharge to SNF when medically stable.  X-ray in good position. Dressing is changed and hip spica dressing placed. Will follow.   Lovell Sheehan , MD 08/18/2018, 11:57 AM

## 2018-08-18 NOTE — Progress Notes (Signed)
Post HD assessment    08/18/18 0013  Neurological  Level of Consciousness Alert  Orientation Level Oriented to person;Oriented to place;Disoriented to time;Disoriented to situation  Respiratory  Respiratory Pattern Regular;Unlabored  Chest Assessment Chest expansion symmetrical  Cardiac  Pulse Irregular  ECG Monitor Yes  Cardiac Rhythm Atrial fibrillation  Vascular  R Radial Pulse +2  L Radial Pulse +2  Edema Generalized;Right lower extremity;Left lower extremity  Integumentary  Integumentary (WDL) X  Skin Color Appropriate for ethnicity  Musculoskeletal  Musculoskeletal (WDL) X  Generalized Weakness Yes  Assistive Device None  GU Assessment  Genitourinary (WDL) X  Genitourinary Symptoms  (HD)  Psychosocial  Psychosocial (WDL) WDL

## 2018-08-19 ENCOUNTER — Encounter: Payer: Self-pay | Admitting: Specialist

## 2018-08-19 LAB — TYPE AND SCREEN
ABO/RH(D): O POS
Antibody Screen: NEGATIVE
Unit division: 0
Unit division: 0
Unit division: 0

## 2018-08-19 LAB — BPAM RBC
BLOOD PRODUCT EXPIRATION DATE: 202001062359
Blood Product Expiration Date: 202001042359
Blood Product Expiration Date: 202001042359
ISSUE DATE / TIME: 201912111611
Unit Type and Rh: 5100
Unit Type and Rh: 5100
Unit Type and Rh: 5100

## 2018-08-19 LAB — GLUCOSE, CAPILLARY
GLUCOSE-CAPILLARY: 114 mg/dL — AB (ref 70–99)
Glucose-Capillary: 126 mg/dL — ABNORMAL HIGH (ref 70–99)

## 2018-08-19 LAB — CBC
HCT: 19.7 % — ABNORMAL LOW (ref 36.0–46.0)
Hemoglobin: 6.5 g/dL — ABNORMAL LOW (ref 12.0–15.0)
MCH: 34.2 pg — ABNORMAL HIGH (ref 26.0–34.0)
MCHC: 33 g/dL (ref 30.0–36.0)
MCV: 103.7 fL — ABNORMAL HIGH (ref 80.0–100.0)
Platelets: 135 10*3/uL — ABNORMAL LOW (ref 150–400)
RBC: 1.9 MIL/uL — ABNORMAL LOW (ref 3.87–5.11)
RDW: 15.1 % (ref 11.5–15.5)
WBC: 11.9 10*3/uL — ABNORMAL HIGH (ref 4.0–10.5)
nRBC: 0.2 % (ref 0.0–0.2)

## 2018-08-19 LAB — BASIC METABOLIC PANEL
ANION GAP: 9 (ref 5–15)
BUN: 39 mg/dL — ABNORMAL HIGH (ref 8–23)
CO2: 27 mmol/L (ref 22–32)
Calcium: 8 mg/dL — ABNORMAL LOW (ref 8.9–10.3)
Chloride: 95 mmol/L — ABNORMAL LOW (ref 98–111)
Creatinine, Ser: 2.88 mg/dL — ABNORMAL HIGH (ref 0.44–1.00)
GFR calc Af Amer: 17 mL/min — ABNORMAL LOW (ref >60)
GFR, EST NON AFRICAN AMERICAN: 15 mL/min — AB (ref >60)
GLUCOSE: 110 mg/dL — AB (ref 70–99)
Potassium: 3.4 mmol/L — ABNORMAL LOW (ref 3.5–5.1)
Sodium: 131 mmol/L — ABNORMAL LOW (ref 135–145)

## 2018-08-19 LAB — PREPARE RBC (CROSSMATCH)

## 2018-08-19 MED ORDER — PSYLLIUM 95 % PO PACK
1.0000 | PACK | Freq: Every day | ORAL | Status: DC | PRN
  Filled 2018-08-19: qty 1

## 2018-08-19 MED ORDER — FUROSEMIDE 40 MG PO TABS
80.0000 mg | ORAL_TABLET | ORAL | Status: DC
  Administered 2018-08-21: 80 mg via ORAL
  Filled 2018-08-19: qty 2

## 2018-08-19 MED ORDER — FUROSEMIDE 40 MG PO TABS
80.0000 mg | ORAL_TABLET | Freq: Every day | ORAL | Status: DC
  Administered 2018-08-19: 80 mg via ORAL
  Filled 2018-08-19: qty 2

## 2018-08-19 MED ORDER — SODIUM CHLORIDE 0.9% IV SOLUTION: Freq: Once | INTRAVENOUS | Status: DC

## 2018-08-19 NOTE — Progress Notes (Signed)
[place] [facility] [place] NAME: Dawn Bradford    MR#:  [id number]  DATE OF BIRTH:  1935-12-04  SUBJECTIVE:  CHIEF COMPLAINT:   Chief Complaint  Patient presents with  . Fall   -Postop day 2 after right hip surgery,  - hb low and receiving transfusion today, feels anxious and worn out  REVIEW OF SYSTEMS:  Review of Systems  Constitutional: Positive for malaise/fatigue. Negative for chills and fever.  HENT: Negative for ear discharge, hearing loss and nosebleeds.   Eyes: Negative for blurred vision and double vision.  Respiratory: Positive for shortness of breath. Negative for cough and wheezing.   Cardiovascular: Negative for chest pain, palpitations and leg swelling.  Gastrointestinal: Negative for abdominal pain, constipation, diarrhea, nausea and vomiting.  Genitourinary: Negative for dysuria.  Musculoskeletal: Positive for joint pain and myalgias.  Neurological: Negative for dizziness, focal weakness, seizures, weakness and headaches.  Psychiatric/Behavioral: Negative for depression. The patient is nervous/anxious.     DRUG ALLERGIES:   Allergies  Allergen Reactions  . Ace Inhibitors Other (See Comments)    Reaction:  Unknown   . Amlodipine Other (See Comments)    Reaction:  Unknown   . Clonidine Hydrochloride Other (See Comments)    Reaction:  Unknown   . Codeine Hives  . Latex Hives  . Metformin Nausea And Vomiting  . Tape Rash    VITALS:  Blood pressure (!) 121/51, pulse 78, temperature 98.1 F (36.7 C), temperature source Axillary, resp. rate 20, height 5\' 3"  (1.6 m), weight 122.1 kg, last menstrual period 02/01/1986, SpO2 98 %.  PHYSICAL EXAMINATION:  Physical Exam  GENERAL:  82 y.o.-year-old elderly  patient lying in the bed with no acute distress.  EYES: Pupils equal, round, reactive to light and accommodation. No scleral icterus. Extraocular muscles intact.  HEENT: Head atraumatic, normocephalic. Oropharynx and  nasopharynx clear.  NECK:  Supple, no jugular venous distention. No thyroid enlargement, no tenderness.  LUNGS: Normal breath sounds bilaterally, no wheezing, rales,rhonchi or crepitation. No use of accessory muscles of respiration.  Decreased bibasilar breath sounds with some crackles heard today CARDIOVASCULAR: S1, S2 normal. No rubs, or gallops.  2/6 systolic murmur is present ABDOMEN: Soft, nontender, nondistended. Bowel sounds present. No organomegaly or mass.  EXTREMITIES: Right hip dressing in place.  No pedal edema, cyanosis, or clubbing.  Left forearm AV fistula NEUROLOGIC: Cranial nerves II through XII are intact. Muscle strength 5/5 in all extremities. Sensation intact. Gait not checked.  Will weakness noted PSYCHIATRIC: The patient is alert and oriented x 3.  SKIN: No obvious rash, lesion, or ulcer.    LABORATORY PANEL:   CBC Recent Labs  Lab 08/19/18 0424  WBC 11.9*  HGB 6.5*  HCT 19.7*  PLT 135*   ------------------------------------------------------------------------------------------------------------------  Chemistries  Recent Labs  Lab 08/19/18 0424  NA 131*  K 3.4*  CL 95*  CO2 27  GLUCOSE 110*  BUN 39*  CREATININE 2.88*  CALCIUM 8.0*   ------------------------------------------------------------------------------------------------------------------  Cardiac Enzymes No results for input(s): TROPONINI in the last 168 hours. ------------------------------------------------------------------------------------------------------------------  RADIOLOGY:  Dg Hip Operative Unilat W Or W/o Pelvis Right  Result Date: 08/17/2018 CLINICAL DATA:  Intramedullary nail placement for intertrochanteric femur fracture. EXAM: OPERATIVE RIGHT HIP (WITH PELVIS IF PERFORMED)  VIEWS TECHNIQUE: Fluoroscopic spot image(s) were submitted for interpretation post-operatively. COMPARISON:  Radiographs 08/11/2018. FINDINGS: Five spot fluoroscopic images demonstrate the placement  of a right femoral intramedullary nail secured by 1 visible distal interlocking  screw. The tip of the intramedullary nail is not visualized. The alignment of the intertrochanteric fracture appears improved. No complications are identified. IMPRESSION: Intraoperative views during ORIF of intertrochanteric right femur fracture. The distal end of the intramedullary nail is not visualized. Electronically Signed   By: Richardean Sale M.D.   On: 08/17/2018 17:40    EKG:   Orders placed or performed during the hospital encounter of 08/10/18  . ED EKG  . ED EKG  . EKG 12-Lead  . EKG 12-Lead  . EKG 12-Lead  . EKG 12-Lead  . EKG 12-Lead  . EKG 12-Lead    ASSESSMENT AND PLAN:   82 year old female with past medical history significant for CAD status post CABG, sleep apnea, carotid disease, history of stroke, end-stage renal disease on Monday Wednesday Friday hemodialysis, history of renal artery stenosis admitted after a fall and right hip fracture.  1.  Right hip fracture-patient had a mechanical fall with right intertrochanteric femoral neck fracture. -Status post surgery on 08/17/2018.  Postop day 2 today -Continue with pain control, physical therapy -Appreciate orthopedics consult  2.  Anemia of chronic disease-received 1 unit transfusion with dialysis prior to surgery.  Now hb low at 6.5 -So ordered 1 unit of packed RBC transfusion for today. -No active bleeding, bleeding from the surgical site improved after reinforce dressing -Monitor for more transfusion need -Started on Epogen with dialysis.  Also on iron supplements  3.  End-stage renal disease on hemodialysis-appreciate nephrology consult.  Patient on Monday, Wednesday and Friday dialysis schedule.  Last dialysis post surgery on 08/17/2018.  Monitor for now -Dialysis again tomorrow.  Lasix restarted for nondialysis days  4.  Hypertension-on Coreg, hydralazine and irbesartan  5.  DVT prophylaxis- subcutaneous heparin   Updated  daughter at bedside    All the records are reviewed and case discussed with Care Management/Social Workerr. Management plans discussed with the patient, family and they are in agreement.  CODE STATUS: Full code  TOTAL TIME TAKING CARE OF THIS PATIENT: 37 minutes.   POSSIBLE D/C IN 2-3 DAYS, DEPENDING ON CLINICAL CONDITION.   Gladstone Lighter M.D on 08/19/2018 at 1:07 PM  Between 7am to 6pm - Pager - [phone]  After 6pm go to [email or web address] - password [email or web address] [facility]  Office  [phone]  CC: Primary care physician; Hortencia Pilar, MD

## 2018-08-19 NOTE — Progress Notes (Signed)
[facility]  ROUNDING NOTE   Subjective:   Daughter at bedside.   1 unit PRBC transfusion ordered.   Objective:  Vital signs in last 24 hours:  Temp:  [97.6 F (36.4 C)-98.5 F (36.9 C)] 98.1 F (36.7 C) (12/15 1130) Pulse Rate:  [76-85] 78 (12/15 1130) Resp:  [18-20] 20 (12/15 1130) BP: (117-148)/(43-56) 121/51 (12/15 1130) SpO2:  [95 %-99 %] 98 % (12/15 1130)  Weight change:  Filed Weights   08/15/18 1811 08/17/18 2054 08/18/18 0015  Weight: 122 kg 122 kg 122.1 kg    Intake/Output: I/O last 3 completed shifts: In: 151.2 [I.V.:1.2; IV Piggyback:150] Out: 225 [Urine:300]   Intake/Output this shift:  No intake/output data recorded.  Physical Exam: General: No acute distress  Head: Normocephalic, atraumatic. Moist oral mucosal membranes  Eyes: Anicteric  Neck: Supple, trachea midline  Lungs:  Clear to auscultation, normal effort  Heart: S1S2 no rubs  Abdomen:  Soft, nontender, bowel sounds present  Extremities: No peripheral edema. Right hip with clean dressings  Neurologic: Awake, alert, following commands  Skin: No lesions  Access: LUE AVF    Basic Metabolic Panel: Recent Labs  Lab 08/13/18 0333 08/13/18 1206 08/15/18 0959 08/15/18 1300 08/17/18 1000 08/17/18 2107 08/18/18 0527 08/19/18 0424  NA 132*  --  132*  --   --  130* 134* 131*  K 3.8  --  3.5  --  3.6 3.7 3.7 3.4*  CL 97*  --  96*  --   --  97* 99 95*  CO2 28  --  29  --   --  23 27 27   GLUCOSE 132*  --  144*  --   --  158* 141* 110*  BUN 37*  --  37*  --   --  40* 25* 39*  CREATININE 4.39*  --  3.31*  --   --  3.22* 2.26* 2.88*  CALCIUM 8.3*  --  8.4*  --   --  8.3* 7.9* 8.0*  PHOS  --  4.1  --  2.5  --  3.6  --   --     Liver Function Tests: Recent Labs  Lab 08/15/18 1300 08/17/18 2107  ALBUMIN 2.6* 2.8*   No results for input(s): LIPASE, AMYLASE in the last 168 hours. No results for input(s): AMMONIA in the last 168 hours.  CBC: Recent Labs  Lab 08/15/18 1003  08/16/18 1346 08/17/18 1438 08/17/18 1831 08/17/18 2107 08/18/18 0527 08/19/18 0424  WBC 8.7 8.1  --   --  18.1* 14.4* 11.9*  HGB 7.1* 9.0*  --  8.5* 8.4* 7.7* 6.5*  HCT 20.7* 27.0*  --  25.8* 25.1* 23.1* 19.7*  MCV 106.7* 104.2*  --   --  103.7* 106.0* 103.7*  PLT 128* 120* 143*  --  153 129* 135*    Cardiac Enzymes: No results for input(s): CKTOTAL, CKMB, CKMBINDEX, TROPONINI in the last 168 hours.  BNP: Invalid input(s): POCBNP  CBG: Recent Labs  Lab 08/18/18 1134 08/18/18 1643 08/18/18 2130 08/19/18 0803 08/19/18 1153  GLUCAP 135* 113* 125* 114* 126*    Microbiology: Results for orders placed or performed during the hospital encounter of 10/11/17  MRSA PCR Screening     Status: None   Collection Time: 10/14/17  5:30 PM  Result Value Ref Range Status   MRSA by PCR NEGATIVE NEGATIVE Final    Comment:        The GeneXpert MRSA Assay (FDA approved for NASAL specimens only), is  one component of a comprehensive MRSA colonization surveillance program. It is not intended to diagnose MRSA infection nor to guide or monitor treatment for MRSA infections. Performed at [facility], [address] [place], [place] [address]     Coagulation Studies: No results for input(s): LABPROT, INR in the last 72 hours.  Urinalysis: No results for input(s): COLORURINE, LABSPEC, PHURINE, GLUCOSEU, HGBUR, BILIRUBINUR, KETONESUR, PROTEINUR, UROBILINOGEN, NITRITE, LEUKOCYTESUR in the last 72 hours.  Invalid input(s): APPERANCEUR    Imaging: Dg Hip Operative Unilat W Or W/o Pelvis Right  Result Date: 08/17/2018 CLINICAL DATA:  Intramedullary nail placement for intertrochanteric femur fracture. EXAM: OPERATIVE RIGHT HIP (WITH PELVIS IF PERFORMED)  VIEWS TECHNIQUE: Fluoroscopic spot image(s) were submitted for interpretation post-operatively. COMPARISON:  Radiographs 08/11/2018. FINDINGS: Five spot fluoroscopic images demonstrate the placement of a right femoral  intramedullary nail secured by 1 visible distal interlocking screw. The tip of the intramedullary nail is not visualized. The alignment of the intertrochanteric fracture appears improved. No complications are identified. IMPRESSION: Intraoperative views during ORIF of intertrochanteric right femur fracture. The distal end of the intramedullary nail is not visualized. Electronically Signed   By: Richardean Sale M.D.   On: 08/17/2018 17:40     Medications:    . sodium chloride   Intravenous Once  . sodium chloride   Intravenous Once  . sodium chloride   Intravenous Once  . carvedilol  25 mg Oral Q M,W,F-1800  . chlorhexidine  1 application Topical Once  . Chlorhexidine Gluconate Cloth  6 each Topical Q0600  . cholecalciferol  5,000 Units Oral Daily  . epoetin (EPOGEN/PROCRIT) injection  10,000 Units Intravenous Q M,W,F-HD  . Ferrous Fumarate  1 tablet Oral Daily   And  . iron polysaccharides  150 mg Oral Daily  . ferrous sulfate  325 mg Oral Q breakfast  . fluconazole  150 mg Oral Once  . FLUoxetine  20 mg Oral Daily  . furosemide  80 mg Oral Daily  . heparin injection (subcutaneous)  5,000 Units Subcutaneous Q8H  . hydrALAZINE  50 mg Oral BID  . insulin aspart  0-15 Units Subcutaneous TID WC  . insulin aspart  0-5 Units Subcutaneous QHS  . irbesartan  300 mg Oral Daily  . isosorbide mononitrate  30 mg Oral QHS  . levothyroxine  75 mcg Oral QAC breakfast  . methocarbamol  500 mg Oral TID  . midodrine  5 mg Oral BID WC  . rosuvastatin  10 mg Oral QPM  . senna  1 tablet Oral BID  . traMADol  50 mg Oral Q6H  . tranexamic acid (CYKLOKAPRON) topical -INTRAOP  2,000 mg Topical Once  . traZODone  50 mg Oral QHS   acetaminophen **OR** acetaminophen, albuterol, alum & mag hydroxide-simeth, bisacodyl, bisacodyl, HYDROcodone-acetaminophen, magnesium hydroxide, menthol-cetylpyridinium **OR** phenol, metoCLOPramide **OR** metoCLOPramide (REGLAN) injection, ondansetron **OR** ondansetron  (ZOFRAN) IV, senna-docusate, sodium phosphate, zolpidem  Assessment/ Plan:  82 y.o. female with end-stage renal disease, coronary disease, carotid stenosis, history of stroke, diabetes, CABG, obstructive sleep apnea, history of renal artery stenosis, admitfed for right hip fracture 08/10/18.  [employer] [facility]/MWF/[facility]/99.5  1.  ESRD on HD MWF. Hemodialysis for Monday.   2.  Anemia of chronic kidney disease.  status post PRBC transfusion 12/11 and 12/15 - EPO with HD treatments  3.  Hypertension: blood pressure at goal - carvedilol, hydralazine, irbesartan.  4. Secondary Hyperparathyroidism: PTH low at 100. Outpatient phosphorus and calcium at goal.  - not currently on binders.  LOS: 8 Dawn Bradford 12/15/20191:09 PM

## 2018-08-19 NOTE — Progress Notes (Signed)
PT Cancellation Note  Patient Details Name: Dawn Bradford MRN: [id number] DOB: 09/20/1935   Cancelled Treatment:    Reason Eval/Treat Not Completed: Medical issues which prohibited therapy(Most recent H&H  6.5, 19.7 outside of safe parameters for working with PT. Will discuss with RN and attempt at later date/time once pt is more appropriate. )  8:46 AM, 08/19/18 Etta Grandchild, PT, DPT Physical Therapist - [facility]  [phone] ([place])     Reily Treloar C 08/19/2018, 8:46 AM

## 2018-08-19 NOTE — Progress Notes (Signed)
Subjective:  Patient reports pain as moderate.    Objective:   VITALS:   Vitals:   08/18/18 1604 08/18/18 1642 08/18/18 2346 08/19/18 0804  BP:  (!) 127/56 (!) 122/47 (!) 148/43  Pulse:  85 81 81  Resp:   19 18  Temp:  98.5 F (36.9 C) 98.2 F (36.8 C) 97.6 F (36.4 C)  TempSrc:  Oral Oral Oral  SpO2: 99% 96% 98% 98%  Weight:      Height:        PHYSICAL EXAM:  Neurovascular intact Incision: dressing C/D/I Compartment soft  LABS  Results for orders placed or performed during the hospital encounter of 08/10/18 (from the past 24 hour(s))  Glucose, capillary     Status: Abnormal   Collection Time: 08/18/18 11:34 AM  Result Value Ref Range   Glucose-Capillary 135 (H) 70 - 99 mg/dL  Glucose, capillary     Status: Abnormal   Collection Time: 08/18/18  4:43 PM  Result Value Ref Range   Glucose-Capillary 113 (H) 70 - 99 mg/dL  Glucose, capillary     Status: Abnormal   Collection Time: 08/18/18  9:30 PM  Result Value Ref Range   Glucose-Capillary 125 (H) 70 - 99 mg/dL   Comment 1 Notify RN   CBC     Status: Abnormal   Collection Time: 08/19/18  4:24 AM  Result Value Ref Range   WBC 11.9 (H) 4.0 - 10.5 K/uL   RBC 1.90 (L) 3.87 - 5.11 MIL/uL   Hemoglobin 6.5 (L) 12.0 - 15.0 g/dL   HCT 19.7 (L) 36.0 - 46.0 %   MCV 103.7 (H) 80.0 - 100.0 fL   MCH 34.2 (H) 26.0 - 34.0 pg   MCHC 33.0 30.0 - 36.0 g/dL   RDW 15.1 11.5 - 15.5 %   Platelets 135 (L) 150 - 400 K/uL   nRBC 0.2 0.0 - 0.2 %  Basic metabolic panel     Status: Abnormal   Collection Time: 08/19/18  4:24 AM  Result Value Ref Range   Sodium 131 (L) 135 - 145 mmol/L   Potassium 3.4 (L) 3.5 - 5.1 mmol/L   Chloride 95 (L) 98 - 111 mmol/L   CO2 27 22 - 32 mmol/L   Glucose, Bld 110 (H) 70 - 99 mg/dL   BUN 39 (H) 8 - 23 mg/dL   Creatinine, Ser 2.88 (H) 0.44 - 1.00 mg/dL   Calcium 8.0 (L) 8.9 - 10.3 mg/dL   GFR calc non Af Amer 15 (L) >60 mL/min   GFR calc Af Amer 17 (L) >60 mL/min   Anion gap 9 5 - 15  Prepare  RBC     Status: None (Preliminary result)   Collection Time: 08/19/18  7:30 AM  Result Value Ref Range   Order Confirmation      PLEASE ORDER TYPE AND SCREEN. TYPE AND SCREEN EXPIRED Performed at [facility], [address] [place], [place] [address]   Glucose, capillary     Status: Abnormal   Collection Time: 08/19/18  8:03 AM  Result Value Ref Range   Glucose-Capillary 114 (H) 70 - 99 mg/dL    Dg Hip Operative Unilat W Or W/o Pelvis Right  Result Date: 08/17/2018 CLINICAL DATA:  Intramedullary nail placement for intertrochanteric femur fracture. EXAM: OPERATIVE RIGHT HIP (WITH PELVIS IF PERFORMED)  VIEWS TECHNIQUE: Fluoroscopic spot image(s) were submitted for interpretation post-operatively. COMPARISON:  Radiographs 08/11/2018. FINDINGS: Five spot fluoroscopic images demonstrate the placement of a right femoral  intramedullary nail secured by 1 visible distal interlocking screw. The tip of the intramedullary nail is not visualized. The alignment of the intertrochanteric fracture appears improved. No complications are identified. IMPRESSION: Intraoperative views during ORIF of intertrochanteric right femur fracture. The distal end of the intramedullary nail is not visualized. Electronically Signed   By: Richardean Sale M.D.   On: 08/17/2018 17:40    Assessment/Plan: 2 Days Post-Op   Active Problems:   Closed intertrochanteric fracture of hip, right, initial encounter ([place])   Up with therapy when able Discharge to SNF, OK from orthopedic standpoint. Follow-up with Dr. Sabra Heck in 14 to 16 days.   Lovell Sheehan , MD 08/19/2018, 10:15 AM

## 2018-08-20 ENCOUNTER — Encounter
Admission: RE | Admit: 2018-08-20 | Discharge: 2018-08-20 | Disposition: A | Discharge status: Home / Self Care | Payer: Medicare Other | Source: Ambulatory Visit | Attending: Internal Medicine | Admitting: Internal Medicine

## 2018-08-20 LAB — GLUCOSE, CAPILLARY
GLUCOSE-CAPILLARY: 96 mg/dL (ref 70–99)
GLUCOSE-CAPILLARY: 166 mg/dL — AB (ref 70–99)
Glucose-Capillary: 132 mg/dL — ABNORMAL HIGH (ref 70–99)
Glucose-Capillary: 108 mg/dL — ABNORMAL HIGH (ref 70–99)
Glucose-Capillary: 122 mg/dL — ABNORMAL HIGH (ref 70–99)
Glucose-Capillary: 103 mg/dL — ABNORMAL HIGH (ref 70–99)

## 2018-08-20 LAB — CBC
HEMATOCRIT: 22.8 % — AB (ref 36.0–46.0)
HEMOGLOBIN: 7.6 g/dL — AB (ref 12.0–15.0)
MCH: 33.5 pg (ref 26.0–34.0)
MCHC: 33.3 g/dL (ref 30.0–36.0)
MCV: 100.4 fL — AB (ref 80.0–100.0)
Platelets: 151 10*3/uL (ref 150–400)
RBC: 2.27 MIL/uL — ABNORMAL LOW (ref 3.87–5.11)
RDW: 17.2 % — ABNORMAL HIGH (ref 11.5–15.5)
WBC: 12.1 10*3/uL — ABNORMAL HIGH (ref 4.0–10.5)
nRBC: 0.3 % — ABNORMAL HIGH (ref 0.0–0.2)

## 2018-08-20 LAB — PREPARE RBC (CROSSMATCH)

## 2018-08-20 MED ORDER — TRAMADOL HCL 50 MG PO TABS
50.0000 mg | ORAL_TABLET | Freq: Four times a day (QID) | ORAL | Status: DC | PRN
  Administered 2018-08-21 (×2): 50 mg via ORAL
  Filled 2018-08-20 (×3): qty 1

## 2018-08-20 MED ORDER — METHOCARBAMOL 500 MG PO TABS
500.0000 mg | ORAL_TABLET | Freq: Three times a day (TID) | ORAL | Status: DC
  Administered 2018-08-20 – 2018-08-21 (×3): 500 mg via ORAL
  Filled 2018-08-20 (×3): qty 1

## 2018-08-20 MED ORDER — SODIUM CHLORIDE 0.9% IV SOLUTION: Freq: Once | INTRAVENOUS | Status: DC

## 2018-08-20 NOTE — Progress Notes (Signed)
Pt sat up in chair for 4 hours this evening. Tolerating well.

## 2018-08-20 NOTE — Progress Notes (Signed)
Physical Therapy Treatment Patient Details Name: Dawn Bradford MRN: [id number] DOB: Feb 06, 1936 Today's Date: 08/20/2018    History of Present Illness Dawn Bradford is an 82yo female who comes to [facility] after sustaining a fall, found to have Rt hip fracture, now s/p Rt hip ORIF c IM rod. PMH: CHF, COPD, CAD, ESRD, DM, stents s/p MI, ESRD on HD MWF. Pt had 1 unit PRBC prior to surgery.     PT Comments    Pt agreeable to PT; comfortable at rest, but pain in RLE increases with all movement and functional activity (pt yelling at times with transfers). Pt requires Max A for all mobility with constant sequencing cues required each step. Pt received up in chair. Continue PT to progress endurance, strength and all functional mobility.   Follow Up Recommendations        Equipment Recommendations       Recommendations for Other Services       Precautions / Restrictions Precautions Precautions: Fall Restrictions Weight Bearing Restrictions: No RLE Weight Bearing: Partial weight bearing(cannot manage rw/manages wb'g status questionably)    Mobility  Bed Mobility Overal bed mobility: Needs Assistance Bed Mobility: Supine to Sit     Supine to sit: Max assist        Transfers Overall transfer level: Needs assistance   Transfers: Sit to/from Stand;Stand Pivot Transfers Sit to Stand: Max assist;From elevated surface Stand pivot transfers: Max assist;From elevated surface       General transfer comment: Unable to perform with rw with heavy posterior lean  Ambulation/Gait             General Gait Details: unable   Stairs             Wheelchair Mobility    Modified Rankin (Stroke Patients Only)       Balance Overall balance assessment: Needs assistance   Sitting balance-Leahy Scale: Fair     Standing balance support: Bilateral upper extremity supported Standing balance-Leahy Scale: Zero                              Cognition  Arousal/Alertness: Awake/alert Behavior During Therapy: WFL for tasks assessed/performed Overall Cognitive Status: Within Functional Limits for tasks assessed                                        Exercises      General Comments        Pertinent Vitals/Pain Pain Assessment: Faces Faces Pain Scale: Hurts worst(with function/yells; comfortable at rest) Pain Location: RLE    Home Living                      Prior Function            PT Goals (current goals can now be found in the care plan section) Progress towards PT goals: Progressing toward goals    Frequency    BID      PT Plan Current plan remains appropriate    Co-evaluation              AM-PAC PT "6 Clicks" Mobility   Outcome Measure  Help needed turning from your back to your side while in a flat bed without using bedrails?: Total Help needed moving from lying on your back to sitting on the side of a  flat bed without using bedrails?: Total Help needed moving to and from a bed to a chair (including a wheelchair)?: Total Help needed standing up from a chair using your arms (e.g., wheelchair or bedside chair)?: Total Help needed to walk in hospital room?: Total Help needed climbing 3-5 steps with a railing? : Total 6 Click Score: 6    End of Session Equipment Utilized During Treatment: Gait belt Activity Tolerance: Patient limited by pain Patient left: in chair;with call bell/phone within reach;with chair alarm set;with family/visitor present Nurse Communication: Mobility status PT Visit Diagnosis: Other abnormalities of gait and mobility (R26.89);Muscle weakness (generalized) (M62.81);Difficulty in walking, not elsewhere classified (R26.2)     Time: 1093-2355 PT Time Calculation (min) (ACUTE ONLY): 29 min  Charges:  $Therapeutic Activity: 23-37 mins                      Larae Grooms, PTA 08/20/2018, 4:23 PM

## 2018-08-20 NOTE — Progress Notes (Signed)
HD tx end    08/20/18 1408  Vital Signs  Pulse Rate 78  Pulse Rate Source Monitor  Resp 20  BP (!) 157/68  BP Location Right Wrist  BP Method Automatic  Patient Position (if appropriate) Lying  Oxygen Therapy  SpO2 97 %  O2 Device Nasal Cannula  O2 Flow Rate (L/min) 2 L/min  During Hemodialysis Assessment  Dialysis Fluid Bolus Normal Saline  Bolus Amount (mL) 250 mL  Intra-Hemodialysis Comments Tx completed

## 2018-08-20 NOTE — Progress Notes (Signed)
[place] [facility] [place] NAME: Dawn Bradford    MR#:  [id number]  DATE OF BIRTH:  12/07/1935  SUBJECTIVE:  CHIEF COMPLAINT:   Chief Complaint  Patient presents with  . Fall   -Postop day 3 after right hip surgery,  -Hemoglobin still borderline low today.  Received 1 unit transfusion during dialysis -Complains of significant hip pain  REVIEW OF SYSTEMS:  Review of Systems  Constitutional: Positive for malaise/fatigue. Negative for chills and fever.  HENT: Negative for ear discharge, hearing loss and nosebleeds.   Eyes: Negative for blurred vision and double vision.  Respiratory: Positive for shortness of breath. Negative for cough and wheezing.   Cardiovascular: Negative for chest pain, palpitations and leg swelling.  Gastrointestinal: Negative for abdominal pain, constipation, diarrhea, nausea and vomiting.  Genitourinary: Negative for dysuria.  Musculoskeletal: Positive for joint pain and myalgias.  Neurological: Negative for dizziness, focal weakness, seizures, weakness and headaches.  Psychiatric/Behavioral: Negative for depression. The patient is nervous/anxious.     DRUG ALLERGIES:   Allergies  Allergen Reactions  . Ace Inhibitors Other (See Comments)    Reaction:  Unknown   . Amlodipine Other (See Comments)    Reaction:  Unknown   . Clonidine Hydrochloride Other (See Comments)    Reaction:  Unknown   . Codeine Hives  . Latex Hives  . Metformin Nausea And Vomiting  . Tape Rash    VITALS:  Blood pressure 130/60, pulse 84, temperature 98.7 F (37.1 C), temperature source Oral, resp. rate 20, height 5\' 3"  (1.6 m), weight 121.1 kg, last menstrual period 02/01/1986, SpO2 98 %.  PHYSICAL EXAMINATION:  Physical Exam  GENERAL:  82 y.o.-year-old elderly  patient lying in the bed with no acute distress.  EYES: Pupils equal, round, reactive to light and accommodation. No scleral icterus. Extraocular muscles intact.  HEENT: Head  atraumatic, normocephalic. Oropharynx and nasopharynx clear.  NECK:  Supple, no jugular venous distention. No thyroid enlargement, no tenderness.  LUNGS: Normal breath sounds bilaterally, no wheezing, rales,rhonchi or crepitation. No use of accessory muscles of respiration.  Decreased bibasilar breath sounds with some crackles heard today CARDIOVASCULAR: S1, S2 normal. No rubs, or gallops.  2/6 systolic murmur is present ABDOMEN: Soft, nontender, nondistended. Bowel sounds present. No organomegaly or mass.  EXTREMITIES: Right hip dressing in place.  No pedal edema, cyanosis, or clubbing.  Left forearm AV fistula NEUROLOGIC: Cranial nerves II through XII are intact. Muscle strength 5/5 in all extremities. Sensation intact. Gait not checked.  Will weakness noted PSYCHIATRIC: The patient is alert and oriented x 3.  SKIN: No obvious rash, lesion, or ulcer.    LABORATORY PANEL:   CBC Recent Labs  Lab 08/20/18 0322  WBC 12.1*  HGB 7.6*  HCT 22.8*  PLT 151   ------------------------------------------------------------------------------------------------------------------  Chemistries  Recent Labs  Lab 08/19/18 0424  NA 131*  K 3.4*  CL 95*  CO2 27  GLUCOSE 110*  BUN 39*  CREATININE 2.88*  CALCIUM 8.0*   ------------------------------------------------------------------------------------------------------------------  Cardiac Enzymes No results for input(s): TROPONINI in the last 168 hours. ------------------------------------------------------------------------------------------------------------------  RADIOLOGY:  No results found.  EKG:   Orders placed or performed during the hospital encounter of 08/10/18  . ED EKG  . ED EKG  . EKG 12-Lead  . EKG 12-Lead  . EKG 12-Lead  . EKG 12-Lead  . EKG 12-Lead  . EKG 12-Lead    ASSESSMENT AND PLAN:   82 year old female with past medical history  significant for CAD status post CABG, sleep apnea, carotid disease, history  of stroke, end-stage renal disease on Monday Wednesday Friday hemodialysis, history of renal artery stenosis admitted after a fall and right hip fracture.  1.  Right hip fracture-patient had a mechanical fall with right intertrochanteric femoral neck fracture. -Status post surgery on 08/17/2018.  Postop day 3 today -Continue with pain control, physical therapy -Appreciate orthopedics consult  2.  Anemia of chronic disease-received 1 unit transfusion with dialysis prior to surgery.  Received 2 more units after surgery (including today)  -No active bleeding, bleeding from the surgical site improved after reinforced dressing -f/u cbc in AM -Started on Epogen with dialysis.  Also on iron supplements  3.  End-stage renal disease on hemodialysis-appreciate nephrology consult.  Patient on Monday, Wednesday and Friday dialysis schedule.  Last dialysis today   Monitor for now - Lasix restarted for nondialysis days  4.  Hypertension-on Coreg, hydralazine, imdur and irbesartan  5.  DVT prophylaxis- subcutaneous heparin   Updated family at bedside    All the records are reviewed and case discussed with Care Management/Social Workerr. Management plans discussed with the patient, family and they are in agreement.  CODE STATUS: Full code  TOTAL TIME TAKING CARE OF THIS PATIENT: 37 minutes.   POSSIBLE D/C TOMORROW, DEPENDING ON CLINICAL CONDITION.   Gladstone Lighter M.D on 08/20/2018 at 3:17 PM  Between 7am to 6pm - Pager - [phone]  After 6pm go to [email or web address] - password [email or web address] [facility]  Office  [phone]  CC: Primary care physician; Hortencia Pilar, MD

## 2018-08-20 NOTE — Progress Notes (Signed)
Pre HD assessment    08/20/18 1018  Vital Signs  Temp 98 F (36.7 C)  Temp Source Oral  Pulse Rate 95  Pulse Rate Source Monitor  Resp 19  BP (!) 136/48  BP Location Right Wrist  BP Method Automatic  Patient Position (if appropriate) Lying  Oxygen Therapy  SpO2 96 %  O2 Device Nasal Cannula  O2 Flow Rate (L/min) 2 L/min  Pain Assessment  Pain Scale 0-10  Pain Score 0  Dialysis Weight  Weight 122 kg  Type of Weight Pre-Dialysis  Time-Out for Hemodialysis  What Procedure? HD  Pt Identifiers(min of two) First/Last Name;MRN/Account#  Correct Site? Yes  Correct Side? Yes  Correct Procedure? Yes  Consents Verified? Yes  Rad Studies Available? N/A  Safety Precautions Reviewed? Yes  Engineer, civil (consulting) Number  ([id number])  Station Number 1  UF/Alarm Test Passed  Conductivity: Meter 13.8  Conductivity: Machine  13.9  pH 7.4  Reverse Osmosis main  Normal Saline Lot Number '[id number]  Dialyzer Lot Number [id number]  Disposable Set Lot Number [id number]  Machine Temperature 98.6 F (37 C)  Musician and Audible Yes  Blood Lines Intact and Secured Yes  Pre Treatment Patient Checks  Vascular access used during treatment Fistula  Hepatitis B Surface Antigen Results Negative  Date Hepatitis B Surface Antigen Drawn 08/23/17  Hepatitis B Surface Antibody  (>10)  Date Hepatitis B Surface Antibody Drawn 08/22/17  Hemodialysis Consent Verified Yes  Hemodialysis Standing Orders Initiated Yes  ECG (Telemetry) Monitor On Yes  Prime Ordered Normal Saline  Length of  DialysisTreatment -hour(s) 3.5 Hour(s)  Dialyzer Elisio 17H NR  Dialysate 2K, 2.5 Ca  Dialysis Anticoagulant None  Dialysate Flow Ordered 600  Blood Flow Rate Ordered 400 mL/min  Ultrafiltration Goal 1.5 Liters  Pre Treatment Labs Renal panel;CBC  Dialysis Blood Pressure Support Ordered Normal Saline  Education / Care Plan  Dialysis Education Provided Yes  Documented Education in Care Plan Yes  Fistula / Graft  Left Forearm Arteriovenous fistula  No Placement Date or Time found.   Placed prior to admission: Yes  Orientation: Left  Access Location: Forearm  Access Type: Arteriovenous fistula  Site Condition No complications  Fistula / Graft Assessment Present;Thrill;Bruit  Drainage Description None

## 2018-08-20 NOTE — Progress Notes (Signed)
Post HD Assessment    08/20/18 1411  Neurological  Level of Consciousness Alert  Orientation Level Oriented to person;Oriented to place;Oriented to time;Disoriented to situation  Respiratory  Respiratory Pattern Regular;Unlabored  Chest Assessment Chest expansion symmetrical  Cardiac  ECG Monitor Yes  Cardiac Rhythm NSR  Vascular  R Radial Pulse +2  L Radial Pulse +2  Edema Generalized;Right lower extremity;Left lower extremity  Integumentary  Integumentary (WDL) X  Skin Color Appropriate for ethnicity  Musculoskeletal  Musculoskeletal (WDL) X  Generalized Weakness Yes  Assistive Device None  GU Assessment  Genitourinary (WDL) X  Genitourinary Symptoms  (HD)  Psychosocial  Psychosocial (WDL) X  Patient Behaviors Tearful  Needs Expressed Emotional  Emotional support given Given to patient

## 2018-08-20 NOTE — Progress Notes (Signed)
Plan is for patient to D/C tomorrow to [facility] pending medical clearance. [facility] admissions coordinator at [facility] is aware of above. Patient's daughter [name] is aware of above.  [employer], LCSW [phone]

## 2018-08-20 NOTE — Progress Notes (Signed)
Pre HD assessment    08/20/18 1019  Neurological  Level of Consciousness Alert  Orientation Level Oriented to person;Oriented to place;Oriented to time;Disoriented to situation  Respiratory  Respiratory Pattern Regular;Unlabored  Chest Assessment Chest expansion symmetrical  Cardiac  ECG Monitor Yes  Cardiac Rhythm Atrial fibrillation;NSR  Vascular  R Radial Pulse +2  L Radial Pulse +2  Edema Generalized;Right lower extremity;Left lower extremity  Integumentary  Integumentary (WDL) X  Skin Color Appropriate for ethnicity  Musculoskeletal  Musculoskeletal (WDL) X  Generalized Weakness Yes  Assistive Device None  GU Assessment  Genitourinary (WDL) X  Genitourinary Symptoms  (HD)  Psychosocial  Psychosocial (WDL) WDL  Patient Behaviors Calm;Cooperative (some confusion )  Needs Expressed Physical  Emotional support given Given to patient

## 2018-08-20 NOTE — Progress Notes (Signed)
Blood administration end    08/20/18 1235  Vital Signs  Pulse Rate 73  Resp (!) 22  Oxygen Therapy  SpO2 97 %

## 2018-08-20 NOTE — Progress Notes (Signed)
Post HD assessment. Pt tolerated tx well without complication. Pt c/o pain during HD tx, MD aware. Net UF 1526, goal met. Pt received 1 unit of blood during HD tx.    08/20/18 1413  Vital Signs  Temp (!) 97.5 F (36.4 C)  Temp Source Oral  Pulse Rate 81  Pulse Rate Source Monitor  Resp (!) 27  BP (!) 160/63  BP Location Right Wrist  BP Method Automatic  Patient Position (if appropriate) Lying  Oxygen Therapy  SpO2 96 %  O2 Device Nasal Cannula  O2 Flow Rate (L/min) 2 L/min  Dialysis Weight  Weight 121.1 kg  Type of Weight Post-Dialysis  Post-Hemodialysis Assessment  Rinseback Volume (mL) 250 mL  KECN 78.7 V  Dialyzer Clearance Lightly streaked  Duration of HD Treatment -hour(s) 3.5 hour(s)  Hemodialysis Intake (mL) 890 mL  UF Total -Machine (mL) 2416 mL  Net UF (mL) 1526 mL  Tolerated HD Treatment Yes  AVG/AVF Arterial Site Held (minutes) 10 minutes  AVG/AVF Venous Site Held (minutes) 10 minutes  Education / Care Plan  Dialysis Education Provided Yes  Documented Education in Care Plan Yes  Fistula / Graft Left Forearm Arteriovenous fistula  No Placement Date or Time found.   Placed prior to admission: Yes  Orientation: Left  Access Location: Forearm  Access Type: Arteriovenous fistula  Site Condition No complications  Fistula / Graft Assessment Present;Thrill;Bruit  Status Deaccessed  Drainage Description None

## 2018-08-20 NOTE — Progress Notes (Signed)
Blood administration start    08/20/18 1123  Vital Signs  Pulse Rate 87  Resp 18  Oxygen Therapy  SpO2 99 %

## 2018-08-20 NOTE — Care Management Important Message (Signed)
Important Message  Patient Details  Name: Dawn Bradford MRN: [id number] Date of Birth: 1935/09/29   Medicare Important Message Given:  Yes    Juliann Pulse A Chaim Gatley 08/20/2018, 10:55 AM

## 2018-08-20 NOTE — Progress Notes (Signed)
Pre Blood administration    08/20/18 1115  Vitals  Vital Signs Type (Include Temp, Pulse, RR, and B/P) Pre-blood (within 30 minutes)  Temp 98 F (36.7 C)  Temp Source Oral  Pulse Rate 87  ECG Heart Rate 91  Resp 14  BP 140/63  Oxygen Therapy  SpO2 98 %  O2 Device Nasal Cannula  O2 Flow Rate (L/min) 2 L/min

## 2018-08-20 NOTE — Progress Notes (Signed)
[facility]  ROUNDING NOTE   Subjective:   Patient seen during dialysis Tolerating well    HEMODIALYSIS FLOWSHEET:  Blood Flow Rate (mL/min): 400 mL/min Arterial Pressure (mmHg): -170 mmHg Venous Pressure (mmHg): 180 mmHg Transmembrane Pressure (mmHg): 50 mmHg Ultrafiltration Rate (mL/min): 710 mL/min Dialysate Flow Rate (mL/min): 600 ml/min Conductivity: Machine : 13.9 Conductivity: Machine : 13.9 Dialysis Fluid Bolus: Normal Saline Bolus Amount (mL): 250 mL    Objective:  Vital signs in last 24 hours:  Temp:  [97.5 F (36.4 C)-98.4 F (36.9 C)] 97.8 F (36.6 C) (12/16 1239) Pulse Rate:  [71-95] 84 (12/16 1330) Resp:  [14-22] 18 (12/16 1330) BP: (112-163)/(43-64) 152/60 (12/16 1330) SpO2:  [94 %-99 %] 98 % (12/16 1330) Weight:  [122 kg] 122 kg (12/16 1018)  Weight change:  Filed Weights   08/17/18 2054 08/18/18 0015 08/20/18 1018  Weight: 122 kg 122.1 kg 122 kg    Intake/Output: I/O last 3 completed shifts: In: 310 [Blood:310] Out: 1500 [Urine:1500]   Intake/Output this shift:  Total I/O In: 750 [P.O.:360; Blood:390] Out: 100 [Urine:100]  Physical Exam: General: No acute distress, very anxious  Head: Normocephalic, atraumatic. Moist oral mucosal membranes  Eyes: Anicteric  Neck: Supple   Lungs:  Clear to auscultation, normal effort  Heart: S1S2 no rubs  Abdomen:  Soft, nontender, bowel sounds present  Extremities: trace peripheral edema.   Neurologic: Awake, alert, following commands  Skin: No lesions  Access: LUE AVF    Basic Metabolic Panel: Recent Labs  Lab 08/15/18 0959 08/15/18 1300 08/17/18 1000 08/17/18 2107 08/18/18 0527 08/19/18 0424  NA 132*  --   --  130* 134* 131*  K 3.5  --  3.6 3.7 3.7 3.4*  CL 96*  --   --  97* 99 95*  CO2 29  --   --  23 27 27   GLUCOSE 144*  --   --  158* 141* 110*  BUN 37*  --   --  40* 25* 39*  CREATININE 3.31*  --   --  3.22* 2.26* 2.88*  CALCIUM 8.4*  --   --  8.3* 7.9* 8.0*  PHOS  --   2.5  --  3.6  --   --     Liver Function Tests: Recent Labs  Lab 08/15/18 1300 08/17/18 2107  ALBUMIN 2.6* 2.8*   No results for input(s): LIPASE, AMYLASE in the last 168 hours. No results for input(s): AMMONIA in the last 168 hours.  CBC: Recent Labs  Lab 08/16/18 1346 08/17/18 1438 08/17/18 1831 08/17/18 2107 08/18/18 0527 08/19/18 0424 08/20/18 0322  WBC 8.1  --   --  18.1* 14.4* 11.9* 12.1*  HGB 9.0*  --  8.5* 8.4* 7.7* 6.5* 7.6*  HCT 27.0*  --  25.8* 25.1* 23.1* 19.7* 22.8*  MCV 104.2*  --   --  103.7* 106.0* 103.7* 100.4*  PLT 120* 143*  --  153 129* 135* 151    Cardiac Enzymes: No results for input(s): CKTOTAL, CKMB, CKMBINDEX, TROPONINI in the last 168 hours.  BNP: Invalid input(s): POCBNP  CBG: Recent Labs  Lab 08/19/18 0803 08/19/18 1153 08/19/18 1700 08/19/18 2025 08/20/18 0800  GLUCAP 114* 126* 132* 71* 35*    Microbiology: Results for orders placed or performed during the hospital encounter of 10/11/17  MRSA PCR Screening     Status: None   Collection Time: 10/14/17  5:30 PM  Result Value Ref Range Status   MRSA by PCR NEGATIVE NEGATIVE Final  Comment:        The GeneXpert MRSA Assay (FDA approved for NASAL specimens only), is one component of a comprehensive MRSA colonization surveillance program. It is not intended to diagnose MRSA infection nor to guide or monitor treatment for MRSA infections. Performed at [facility], [address] [place],  [address]     Coagulation Studies: No results for input(s): LABPROT, INR in the last 72 hours.  Urinalysis: No results for input(s): COLORURINE, LABSPEC, PHURINE, GLUCOSEU, HGBUR, BILIRUBINUR, KETONESUR, PROTEINUR, UROBILINOGEN, NITRITE, LEUKOCYTESUR in the last 72 hours.  Invalid input(s): APPERANCEUR    Imaging: No results found.   Medications:    . sodium chloride   Intravenous Once  . carvedilol  25 mg Oral Q M,W,F-1800  . chlorhexidine  1  application Topical Once  . Chlorhexidine Gluconate Cloth  6 each Topical Q0600  . cholecalciferol  5,000 Units Oral Daily  . epoetin (EPOGEN/PROCRIT) injection  10,000 Units Intravenous Q M,W,F-HD  . Ferrous Fumarate  1 tablet Oral Daily   And  . iron polysaccharides  150 mg Oral Daily  . ferrous sulfate  325 mg Oral Q breakfast  . FLUoxetine  20 mg Oral Daily  . [START ON 08/21/2018] furosemide  80 mg Oral Q T,Th,S,Su  . heparin injection (subcutaneous)  5,000 Units Subcutaneous Q8H  . hydrALAZINE  50 mg Oral BID  . insulin aspart  0-15 Units Subcutaneous TID WC  . insulin aspart  0-5 Units Subcutaneous QHS  . irbesartan  300 mg Oral Daily  . isosorbide mononitrate  30 mg Oral QHS  . levothyroxine  75 mcg Oral QAC breakfast  . midodrine  5 mg Oral BID WC  . rosuvastatin  10 mg Oral QPM  . senna  1 tablet Oral BID  . traMADol  50 mg Oral Q6H  . tranexamic acid (CYKLOKAPRON) topical -INTRAOP  2,000 mg Topical Once  . traZODone  50 mg Oral QHS   acetaminophen **OR** acetaminophen, albuterol, alum & mag hydroxide-simeth, bisacodyl, bisacodyl, HYDROcodone-acetaminophen, magnesium hydroxide, menthol-cetylpyridinium **OR** phenol, metoCLOPramide **OR** metoCLOPramide (REGLAN) injection, ondansetron **OR** ondansetron (ZOFRAN) IV, psyllium, senna-docusate, sodium phosphate, zolpidem  Assessment/ Plan:  82 y.o. female with end-stage renal disease, coronary disease, carotid stenosis, history of stroke, diabetes, CABG, obstructive sleep apnea, history of renal artery stenosis, admitfed for right hip fracture 08/10/18.  [facility]/MWF/[facility]/99.5  1.  ESRD on HD MWF. Patient seen during dialysis Tolerating well  Patient was not able to sit for HD today. She will work with PT as directed by orthopedics team  2.  Anemia of chronic kidney disease.  status post PRBC transfusion 12/11 and 12/15 - EPO with HD treatments  3.  Hypertension: blood pressure at goal - carvedilol,  hydralazine, irbesartan.  4. Secondary Hyperparathyroidism: PTH low at 100. Outpatient phosphorus and calcium at goal.  - not currently on binders.    LOS: [place] 12/16/20191:40 PM

## 2018-08-20 NOTE — Progress Notes (Signed)
Post Blood Administration vitals. Pt received 1 unit of blood during dialysis.    08/20/18 1239  Vital Signs  Temp 97.8 F (36.6 C)  Temp Source Oral  Pulse Rate 74  Pulse Rate Source Monitor  Resp 17  BP (!) 155/54  BP Location Right Wrist  BP Method Automatic  Patient Position (if appropriate) Lying  Oxygen Therapy  SpO2 98 %  O2 Device Nasal Cannula  O2 Flow Rate (L/min) 2 L/min

## 2018-08-20 NOTE — Progress Notes (Signed)
HD tx start    08/20/18 1030  Vital Signs  Pulse Rate 85  Pulse Rate Source Monitor  Resp (!) 22  BP (!) 149/54  BP Location Right Wrist  BP Method Automatic  Patient Position (if appropriate) Lying  Oxygen Therapy  SpO2 98 %  O2 Device Nasal Cannula  O2 Flow Rate (L/min) 2 L/min  During Hemodialysis Assessment  Blood Flow Rate (mL/min) 400 mL/min  Arterial Pressure (mmHg) -100 mmHg  Venous Pressure (mmHg) 170 mmHg  Transmembrane Pressure (mmHg) 50 mmHg  Ultrafiltration Rate (mL/min) 570 mL/min  Dialysate Flow Rate (mL/min) 600 ml/min  Conductivity: Machine  13.9  HD Safety Checks Performed Yes  Dialysis Fluid Bolus Normal Saline  Bolus Amount (mL) 250 mL  Intra-Hemodialysis Comments Tx initiated  Fistula / Graft Left Forearm Arteriovenous fistula  No Placement Date or Time found.   Placed prior to admission: Yes  Orientation: Left  Access Location: Forearm  Access Type: Arteriovenous fistula  Status Accessed  Needle Size 15

## 2018-08-20 NOTE — Progress Notes (Signed)
Physical Therapy Treatment Patient Details Name: Dawn Bradford MRN: [id number] DOB: 1935-10-16 Today's Date: 08/20/2018    History of Present Illness Dawn Bradford is an 82yo female who comes to [facility] after sustaining a fall, found to have Rt hip fracture, now s/p Rt hip ORIF c IM rod. PMH: CHF, COPD, CAD, ESRD, DM, stents s/p MI, ESRD on HD MWF. Pt had 1 unit PRBC prior to surgery.     PT Comments    Pt not available, at hemodialysis. Also with very low BP prior to hemodialysis. Re attempt later today.    Follow Up Recommendations        Equipment Recommendations       Recommendations for Other Services       Precautions / Restrictions      Mobility  Bed Mobility                  Transfers                    Ambulation/Gait                 Stairs             Wheelchair Mobility    Modified Rankin (Stroke Patients Only)       Balance                                            Cognition                                              Exercises      General Comments        Pertinent Vitals/Pain      Home Living                      Prior Function            PT Goals (current goals can now be found in the care plan section)      Frequency           PT Plan      Co-evaluation              AM-PAC PT "6 Clicks" Mobility   Outcome Measure                   End of Session               Time:  -     Charges:                        Larae Grooms, PTA 08/20/2018, 11:37 AM

## 2018-08-20 NOTE — Progress Notes (Signed)
Subjective: 3 Days Post-Op Procedure(s) (LRB): INTRAMEDULLARY (IM) NAIL INTERTROCHANTRIC (Right)    Patient off floor today for dialysis.  Nurses report she is stable.   Objective:   VITALS:   Vitals:   08/20/18 1018 08/20/18 1030  BP: (!) 136/48 (!) 149/54  Pulse: 95 85  Resp: 19 (!) 22  Temp: 98 F (36.7 C)   SpO2: 96% 98%      LABS Recent Labs    08/18/18 0527 08/19/18 0424 08/20/18 0322  HGB 7.7* 6.5* 7.6*  HCT 23.1* 19.7* 22.8*  WBC 14.4* 11.9* 12.1*  PLT 129* 135* 151    Recent Labs    08/17/18 2107 08/18/18 0527 08/19/18 0424  NA 130* 134* 131*  K 3.7 3.7 3.4*  BUN 40* 25* 39*  CREATININE 3.22* 2.26* 2.88*  GLUCOSE 158* 141* 110*    No results for input(s): LABPT, INR in the last 72 hours.   Assessment/Plan: 3 Days Post-Op Procedure(s) (LRB): INTRAMEDULLARY (IM) NAIL INTERTROCHANTRIC (Right)   Advance diet Up with therapy Discharge to SNF   Consider another unit of PRBC.   RTC  2 weeks.    Continue Plavix and ASA  TBWB on iright leg.

## 2018-08-21 ENCOUNTER — Other Ambulatory Visit: Payer: Medicare Other

## 2018-08-21 LAB — BASIC METABOLIC PANEL
Anion gap: 7 (ref 5–15)
BUN: 22 mg/dL (ref 8–23)
CO2: 29 mmol/L (ref 22–32)
Calcium: 8.1 mg/dL — ABNORMAL LOW (ref 8.9–10.3)
Chloride: 98 mmol/L (ref 98–111)
Creatinine, Ser: 2.04 mg/dL — ABNORMAL HIGH (ref 0.44–1.00)
GFR calc Af Amer: 26 mL/min — ABNORMAL LOW (ref >60)
GFR calc non Af Amer: 22 mL/min — ABNORMAL LOW (ref >60)
GLUCOSE: 110 mg/dL — AB (ref 70–99)
Potassium: 3.3 mmol/L — ABNORMAL LOW (ref 3.5–5.1)
Sodium: 134 mmol/L — ABNORMAL LOW (ref 135–145)

## 2018-08-21 LAB — TYPE AND SCREEN
ABO/RH(D): O POS
Antibody Screen: NEGATIVE
Unit division: 0
Unit division: 0

## 2018-08-21 LAB — CBC
HCT: 28.4 % — ABNORMAL LOW (ref 36.0–46.0)
HEMOGLOBIN: 9.4 g/dL — AB (ref 12.0–15.0)
MCH: 32.2 pg (ref 26.0–34.0)
MCHC: 33.1 g/dL (ref 30.0–36.0)
MCV: 97.3 fL (ref 80.0–100.0)
Platelets: 141 10*3/uL — ABNORMAL LOW (ref 150–400)
RBC: 2.92 MIL/uL — ABNORMAL LOW (ref 3.87–5.11)
RDW: 18.7 % — ABNORMAL HIGH (ref 11.5–15.5)
WBC: 9.9 10*3/uL (ref 4.0–10.5)
nRBC: 0.5 % — ABNORMAL HIGH (ref 0.0–0.2)

## 2018-08-21 LAB — BPAM RBC
Blood Product Expiration Date: 202001042359
Blood Product Expiration Date: 202001102359
ISSUE DATE / TIME: 201912151109
ISSUE DATE / TIME: 201912161109
Unit Type and Rh: 5100
Unit Type and Rh: 5100

## 2018-08-21 LAB — GLUCOSE, CAPILLARY
Glucose-Capillary: 114 mg/dL — ABNORMAL HIGH (ref 70–99)
Glucose-Capillary: 151 mg/dL — ABNORMAL HIGH (ref 70–99)

## 2018-08-21 LAB — HEPATITIS B SURFACE ANTIBODY, QUANTITATIVE: Hep B S AB Quant (Post): 68.6 m[IU]/mL (ref >9.9)

## 2018-08-21 LAB — HEPATITIS B SURFACE ANTIGEN: Hepatitis B Surface Ag: NEGATIVE

## 2018-08-21 MED ORDER — SENNA 8.6 MG PO TABS: 1.0000 | ORAL_TABLET | Freq: Two times a day (BID) | ORAL | 0 refills | Status: DC

## 2018-08-21 MED ORDER — HEPARIN SODIUM (PORCINE) 5000 UNIT/ML IJ SOLN: 5000.0000 [IU] | Freq: Three times a day (TID) | INTRAMUSCULAR | 0 refills | Status: DC

## 2018-08-21 MED ORDER — ACETAMINOPHEN 325 MG PO TABS: 650.0000 mg | ORAL_TABLET | Freq: Four times a day (QID) | ORAL | 0 refills | Status: AC | PRN

## 2018-08-21 MED ORDER — PSYLLIUM 95 % PO PACK: 1.0000 | PACK | Freq: Every day | ORAL | 0 refills | Status: DC | PRN

## 2018-08-21 MED ORDER — METHOCARBAMOL 500 MG PO TABS: 500.0000 mg | ORAL_TABLET | Freq: Three times a day (TID) | ORAL | 0 refills | Status: AC

## 2018-08-21 MED ORDER — MIDODRINE HCL 5 MG PO TABS: 5.0000 mg | ORAL_TABLET | Freq: Two times a day (BID) | ORAL | 0 refills | Status: DC

## 2018-08-21 MED ORDER — HYDROCODONE-ACETAMINOPHEN 5-325 MG PO TABS: 1.0000 | ORAL_TABLET | Freq: Four times a day (QID) | ORAL | 0 refills | Status: DC | PRN

## 2018-08-21 MED ORDER — TRAMADOL HCL 50 MG PO TABS: 50.0000 mg | ORAL_TABLET | Freq: Four times a day (QID) | ORAL | 0 refills | Status: DC | PRN

## 2018-08-21 NOTE — Clinical Social Work Placement (Signed)
   CLINICAL SOCIAL WORK PLACEMENT  NOTE  Date:  08/21/2018  Patient Details  Name: Dawn Bradford MRN: [id number] Date of Birth: 03/13/1936  Clinical Social Work is seeking post-discharge placement for this patient at the [place] level of care (*CSW will initial, date and re-position this form in  chart as items are completed):  Yes   Patient/family provided with [facility] Work Department's list of facilities offering this level of care within the geographic area requested by the patient (or if unable, by the patient's family).  Yes   Patient/family informed of their freedom to choose among providers that offer the needed level of care, that participate in Medicare, Medicaid or managed care program needed by the patient, have an available bed and are willing to accept the patient.  Yes   Patient/family informed of [facility]'s ownership interest in [facility] and [facility], as well as of the fact that they are under no obligation to receive care at these facilities.  PASRR submitted to EDS on       PASRR number received on       Existing PASRR number confirmed on 08/12/18     FL2 transmitted to all facilities in geographic area requested by pt/family on 08/12/18     FL2 transmitted to all facilities within larger geographic area on       Patient informed that his/her managed care company has contracts with or will negotiate with certain facilities, including the following:        Yes   Patient/family informed of bed offers received.  Patient chooses bed at [facility] )     Physician recommends and patient chooses bed at      Patient to be transferred to [facility] ) on 08/21/18.  Patient to be transferred to facility by [facility] )     Patient family notified on 08/21/18 of transfer.  Name of family member notified:  (Patient's daughters Benjamine Mola and [name] are aware of D/C today.  )     PHYSICIAN        Additional Comment:    _______________________________________________ Neizan Debruhl, Veronia Beets, LCSW 08/21/2018, 11:34 AM

## 2018-08-21 NOTE — Progress Notes (Signed)
[facility]  ROUNDING NOTE   Subjective:   Patient is doing well Per nursing report, she sat in chair for 4 hrs last evening and 2 hrs this AM Was able to eat breakffast C/o pain in right leg  Objective:  Vital signs in last 24 hours:  Temp:  [97.5 F (36.4 C)-98.7 F (37.1 C)] 98.4 F (36.9 C) (12/17 0751) Pulse Rate:  [69-88] 85 (12/17 0939) Resp:  [14-27] 18 (12/16 2333) BP: (124-163)/(39-68) 143/59 (12/17 0939) SpO2:  [96 %-98 %] 98 % (12/17 0751) Weight:  [121.1 kg] 121.1 kg (12/16 1413)  Weight change:  Filed Weights   08/18/18 0015 08/20/18 1018 08/20/18 1413  Weight: 122.1 kg 122 kg 121.1 kg    Intake/Output: I/O last 3 completed shifts: In: 990 [P.O.:600; Blood:390] Out: 2476 [Urine:950; Other:1526]   Intake/Output this shift:  No intake/output data recorded.  Physical Exam: General: No acute distress, very anxious  Head: Normocephalic, atraumatic. Moist oral mucosal membranes  Eyes: Anicteric  Neck: Supple   Lungs:  Clear to auscultation, normal effort  Heart: S1S2 no rubs  Abdomen:  Soft, nontender,   Extremities: trace peripheral edema.   Neurologic: Awake, alert, following commands  Skin: No lesions  Access: LUE AVF, good bruits    Basic Metabolic Panel: Recent Labs  Lab 08/15/18 0959 08/15/18 1300 08/17/18 1000 08/17/18 2107 08/18/18 0527 08/19/18 0424 08/21/18 0344  NA 132*  --   --  130* 134* 131* 134*  K 3.5  --  3.6 3.7 3.7 3.4* 3.3*  CL 96*  --   --  97* 99 95* 98  CO2 29  --   --  23 27 27 29   GLUCOSE 144*  --   --  158* 141* 110* 110*  BUN 37*  --   --  40* 25* 39* 22  CREATININE 3.31*  --   --  3.22* 2.26* 2.88* 2.04*  CALCIUM 8.4*  --   --  8.3* 7.9* 8.0* 8.1*  PHOS  --  2.5  --  3.6  --   --   --     Liver Function Tests: Recent Labs  Lab 08/15/18 1300 08/17/18 2107  ALBUMIN 2.6* 2.8*   No results for input(s): LIPASE, AMYLASE in the last 168 hours. No results for input(s): AMMONIA in the last 168  hours.  CBC: Recent Labs  Lab 08/17/18 2107 08/18/18 0527 08/19/18 0424 08/20/18 0322 08/21/18 0344  WBC 18.1* 14.4* 11.9* 12.1* 9.9  HGB 8.4* 7.7* 6.5* 7.6* 9.4*  HCT 25.1* 23.1* 19.7* 22.8* 28.4*  MCV 103.7* 106.0* 103.7* 100.4* 97.3  PLT 153 129* 135* 151 141*    Cardiac Enzymes: No results for input(s): CKTOTAL, CKMB, CKMBINDEX, TROPONINI in the last 168 hours.  BNP: Invalid input(s): POCBNP  CBG: Recent Labs  Lab 08/20/18 1520 08/20/18 1639 08/20/18 2113 08/21/18 0753 08/21/18 1136  GLUCAP 96 166* 103* 114* 151*    Microbiology: Results for orders placed or performed during the hospital encounter of 10/11/17  MRSA PCR Screening     Status: None   Collection Time: 10/14/17  5:30 PM  Result Value Ref Range Status   MRSA by PCR NEGATIVE NEGATIVE Final    Comment:        The GeneXpert MRSA Assay (FDA approved for NASAL specimens only), is one component of a comprehensive MRSA colonization surveillance program. It is not intended to diagnose MRSA infection nor to guide or monitor treatment for MRSA infections. Performed at [facility]  Lab, [place], [place] [address]     Coagulation Studies: No results for input(s): LABPROT, INR in the last 72 hours.  Urinalysis: No results for input(s): COLORURINE, LABSPEC, PHURINE, GLUCOSEU, HGBUR, BILIRUBINUR, KETONESUR, PROTEINUR, UROBILINOGEN, NITRITE, LEUKOCYTESUR in the last 72 hours.  Invalid input(s): APPERANCEUR    Imaging: No results found.   Medications:    . sodium chloride   Intravenous Once  . carvedilol  25 mg Oral Q M,W,F-1800  . chlorhexidine  1 application Topical Once  . Chlorhexidine Gluconate Cloth  6 each Topical Q0600  . cholecalciferol  5,000 Units Oral Daily  . epoetin (EPOGEN/PROCRIT) injection  10,000 Units Intravenous Q M,W,F-HD  . Ferrous Fumarate  1 tablet Oral Daily   And  . iron polysaccharides  150 mg Oral Daily  . ferrous sulfate  325 mg Oral Q  breakfast  . FLUoxetine  20 mg Oral Daily  . furosemide  80 mg Oral Q T,Th,S,Su  . heparin injection (subcutaneous)  5,000 Units Subcutaneous Q8H  . hydrALAZINE  50 mg Oral BID  . insulin aspart  0-15 Units Subcutaneous TID WC  . insulin aspart  0-5 Units Subcutaneous QHS  . irbesartan  300 mg Oral Daily  . isosorbide mononitrate  30 mg Oral QHS  . levothyroxine  75 mcg Oral QAC breakfast  . methocarbamol  500 mg Oral TID  . midodrine  5 mg Oral BID WC  . rosuvastatin  10 mg Oral QPM  . senna  1 tablet Oral BID  . tranexamic acid (CYKLOKAPRON) topical -INTRAOP  2,000 mg Topical Once  . traZODone  50 mg Oral QHS   acetaminophen **OR** acetaminophen, albuterol, alum & mag hydroxide-simeth, bisacodyl, bisacodyl, HYDROcodone-acetaminophen, magnesium hydroxide, menthol-cetylpyridinium **OR** phenol, metoCLOPramide **OR** metoCLOPramide (REGLAN) injection, ondansetron **OR** ondansetron (ZOFRAN) IV, psyllium, senna-docusate, sodium phosphate, traMADol, zolpidem  Assessment/ Plan:  82 y.o. female with end-stage renal disease, coronary disease, carotid stenosis, history of stroke, diabetes, CABG, obstructive sleep apnea, history of renal artery stenosis, admitfed for right hip fracture 08/10/18.  [facility]/MWF/[facility]/  1.  ESRD on HD MWF. Patient is expected to be d/c today Follow up at HD tomorrow 2 person assist  2.  Anemia of chronic kidney disease.  status post PRBC transfusion 12/11 and 12/15 - EPO with HD treatments  3.  Hypertension with CKD: blood pressure under acceptable control - carvedilol, hydralazine, irbesartan.     LOS: [place] 12/17/201912:32 PM

## 2018-08-21 NOTE — Progress Notes (Signed)
Pt surgical dressing changed, TED hose on both legs. Pt d/c on 2L oxygen. EMS here to transport pt.

## 2018-08-21 NOTE — Progress Notes (Addendum)
Physical Therapy Treatment Patient Details Name: Dawn Bradford MRN: [id number] DOB: 1935/10/11 Today's Date: 08/21/2018    History of Present Illness Dawn Bradford is an 82yo female who comes to [facility] after sustaining a fall, found to have Rt hip fracture, now s/p Rt hip ORIF c IM rod. PMH: CHF, COPD, CAD, ESRD, DM, stents s/p MI, ESRD on HD MWF. Pt had 1 unit PRBC prior to surgery.     PT Comments    Pt in bed, ready to get up.  To edge of bed with mod a x 2.  Once sitting, she is able to sit with min guard.  Stood with walker with mod a x 2.  She was able to hip/knee flex on left x 5 in standing but was unable to take any steps towards recliner without falling back onto the bed.  Stand pivot with max a x 2 to drop arm recliner with overall poor posture or ability to assist by pt.  She did put good effort into seated exercises.  Pain and weakness along with PWB status RLE limiting factors.  Unable to recall weight bearing status.  Education provided.   Follow Up Recommendations  SNF     Equipment Recommendations  None recommended by PT    Recommendations for Other Services       Precautions / Restrictions Precautions Precautions: Fall Restrictions Weight Bearing Restrictions: No RLE Weight Bearing: Partial weight bearing    Mobility  Bed Mobility Overal bed mobility: Needs Assistance Bed Mobility: Supine to Sit     Supine to sit: Max assist;+2 for safety/equipment;Mod assist        Transfers Overall transfer level: Needs assistance Equipment used: Rolling walker (2 wheeled);None Transfers: Sit to/from [facility] Sit to Stand: Mod assist;+2 physical assistance Stand pivot transfers: Max assist;+2 physical assistance       General transfer comment: Unable to perform with rw with heavy posterior lean  Ambulation/Gait             General Gait Details: unable   Stairs             Wheelchair Mobility    Modified Rankin (Stroke  Patients Only)       Balance Overall balance assessment: Needs assistance Sitting-balance support: Single extremity supported;Feet supported Sitting balance-Leahy Scale: Fair     Standing balance support: Bilateral upper extremity supported Standing balance-Leahy Scale: Zero                              Cognition Arousal/Alertness: Awake/alert Behavior During Therapy: WFL for tasks assessed/performed Overall Cognitive Status: Within Functional Limits for tasks assessed                                        Exercises Other Exercises Other Exercises: seated AROM for LAQ, ab/add, ankle pumps and glut sets in sitting.    General Comments        Pertinent Vitals/Pain Pain Assessment: Faces Faces Pain Scale: Hurts even more Pain Location: RLE Pain Intervention(s): Limited activity within patient's tolerance;Monitored during session;Repositioned    Home Living                      Prior Function            PT Goals (current goals can now be found  in the care plan section) Progress towards PT goals: Progressing toward goals    Frequency    BID      PT Plan Current plan remains appropriate    Co-evaluation              AM-PAC PT "6 Clicks" Mobility   Outcome Measure  Help needed turning from your back to your side while in a flat bed without using bedrails?: Total Help needed moving from lying on your back to sitting on the side of a flat bed without using bedrails?: Total Help needed moving to and from a bed to a chair (including a wheelchair)?: Total Help needed standing up from a chair using your arms (e.g., wheelchair or bedside chair)?: Total Help needed to walk in hospital room?: Total Help needed climbing 3-5 steps with a railing? : Total 6 Click Score: 6    End of Session Equipment Utilized During Treatment: Gait belt Activity Tolerance: Patient limited by pain Patient left: in chair;with call bell/phone  within reach;with chair alarm set;with family/visitor present         Time: 9741-6384 PT Time Calculation (min) (ACUTE ONLY): 17 min  Charges:  $Therapeutic Activity: 8-22 mins                     Chesley Noon, PTA 08/21/18, 10:19 AM

## 2018-08-21 NOTE — Care Management (Signed)
[name] with [employer] notified of patient discharge to [facility] today.

## 2018-08-21 NOTE — Progress Notes (Signed)
Report called to [place] at [place].

## 2018-08-21 NOTE — Discharge Summary (Signed)
[place] [facility] [place] NAME: Dawn Bradford    MR#:  [id number]  DATE OF BIRTH:  10-Sep-1935  DATE OF ADMISSION:  08/10/2018   ADMITTING PHYSICIAN: Arta Silence, MD  DATE OF DISCHARGE:  08/21/18  PRIMARY CARE PHYSICIAN: Hortencia Pilar, MD   ADMISSION DIAGNOSIS:   ESRD on hemodialysis ([place]) [N18.6, Z99.2] Closed displaced fracture of right femoral neck ([place]) [S72.001A] Sleep apnea, unspecified type [G47.30] Coronary artery disease without angina pectoris, unspecified vessel or lesion type, unspecified whether native or transplanted heart [I25.10] Chronic congestive heart failure, unspecified heart failure type ([place]) [I50.9]  DISCHARGE DIAGNOSIS:   Active Problems:   Closed intertrochanteric fracture of hip, right, initial encounter ([place])   SECONDARY DIAGNOSIS:   Past Medical History:  Diagnosis Date  . ACE-inhibitor cough   . Aortic valve disorder    Echo in 2009 showed mean aortic valve gradient of 13 mmHg, suggesting very mild stenosis. Echo (12/10) suggested aortic sclerosis only  . Carotid artery disease (HCC)    mild, carotid dopplers 12/2009  . CHF (congestive heart failure) ([place])   . COPD (chronic obstructive pulmonary disease) ([place])   . Coronary artery disease    s/p anterior MI in 1996 followed by CABG. Lexiscan myoview (4/11): EF 67%, normal perfusion with no evidence for ischemia or infarction.   . Diabetes mellitus    type 2  . Diastolic heart failure    most recent echo (12/10) showed EF 55-60% with mild LVH, grade I diastolic dysfunction, mild LAE.  [name] ESRD (end stage renal disease) on dialysis ([place])   . Hyperlipidemia   . Hypertension    resistant hyptertension times many years. the patient does have renal artery stenosis. she has tried calcium channel blockers in the past and states that she would not take them now because she had some problems with her gums which her dentist identified as calcium-channel blocker  side effects.  . Hypothyroidism   . Mild asthma    PFT 02/05/10 FEV1 1.38, FEV1% 73, TLC 3.78 (86%), DLCO 48%, +BD  . Myocardial infarction ([place])   . Obesity   . Obesity hypoventilation syndrome ([place])       . Obstructive sleep apnea    PSG 01/05/2006 AHI 24.8, CPAP 9cm H2O  . Pulmonary nodule, right   . Renal artery stenosis [facility])    The patient has an occluded right renal artery and an atrophic right kidney. There is 20% left renal artery stenosis. This was seen by catheterization in 2007  . Stroke [facility])     HOSPITAL COURSE:   82 year old female with past medical history significant for CAD status post CABG, sleep apnea, carotid disease, history of stroke, end-stage renal disease on Monday Wednesday Friday hemodialysis, history of renal artery stenosis admitted after a fall and right hip fracture.  1.  Right hip fracture-patient had a mechanical fall with right intertrochanteric femoral neck fracture. -Status post surgery on 08/17/2018.  Postop day 4 today -Continue with pain control, physical therapy -Appreciate orthopedics consult  2.  Anemia of chronic disease-received 1 unit transfusion with dialysis prior to surgery.  Received 2 more units after surgery  -No active bleeding, bleeding from the surgical site improved after reinforced dressing -stable hb - on Epogen with dialysis.  Also on iron supplements - hold asa and plavix for now  3.  End-stage renal disease on hemodialysis-appreciate nephrology consult.  Patient on Monday, Wednesday and Friday dialysis schedule.  Last dialysis 08/21/18 per  schedule   Monitor for now - Lasix for nondialysis days  4.  Hypertension-on Coreg, hydralazine, imdur and micardis Uses- midodrine for orthostatic hypotension- on dialysis days especially  5.  DVT prophylaxis- subcutaneous heparin for 12 more days and stop  6. OSA- uses CPAP at bedtime   Discharge to [facility] rehab today   DISCHARGE CONDITIONS:   Guarded  CONSULTS  OBTAINED:   Treatment Team:  Arta Silence, MD Lovell Sheehan, MD Anthonette Legato, MD  DRUG ALLERGIES:   Allergies  Allergen Reactions  . Ace Inhibitors Other (See Comments)    Reaction:  Unknown   . Amlodipine Other (See Comments)    Reaction:  Unknown   . Clonidine Hydrochloride Other (See Comments)    Reaction:  Unknown   . Codeine Hives  . Latex Hives  . Metformin Nausea And Vomiting  . Tape Rash   DISCHARGE MEDICATIONS:   Allergies as of 08/21/2018      Reactions   Ace Inhibitors Other (See Comments)   Reaction:  Unknown    Amlodipine Other (See Comments)   Reaction:  Unknown    Clonidine Hydrochloride Other (See Comments)   Reaction:  Unknown    Codeine Hives   Latex Hives   Metformin Nausea And Vomiting   Tape Rash      Medication List    STOP taking these medications   aspirin EC 81 MG tablet   clopidogrel 75 MG tablet Commonly known as:  PLAVIX   gabapentin 100 MG capsule Commonly known as:  NEURONTIN   lactulose 10 GM/15ML solution Commonly known as:  CHRONULAC     TAKE these medications   acetaminophen 325 MG tablet Commonly known as:  TYLENOL Take 2 tablets (650 mg total) by mouth every 6 (six) hours as needed for mild pain, fever or headache (or Fever >/= 101). What changed:    medication strength  how much to take  reasons to take this   albuterol 108 (90 Base) MCG/ACT inhaler Commonly known as:  PROVENTIL HFA;VENTOLIN HFA Inhale 2 puffs into the lungs every 6 (six) hours as needed for wheezing or shortness of breath.   b complex-vitamin c-folic acid 0.8 MG Tabs tablet Take 1 tablet by mouth daily.   carvedilol 25 MG tablet Commonly known as:  COREG Take 25 mg by mouth 2 (two) times daily.   ferrous fumarate-iron polysaccharide complex 162-115.2 MG Caps capsule Commonly known as:  TANDEM Take 1 capsule by mouth daily.   FLUoxetine 20 MG tablet Commonly known as:  PROZAC Take 20 mg by mouth daily.   furosemide 40  MG tablet Commonly known as:  LASIX Take 20-80 mg by mouth. Sunday, Tuesday and Saturday pt takes 80 mg in the morning. Thursdays 20 mg only. Tuesdays pt may take lower dose.   heparin 5000 UNIT/ML injection Inject 1 mL (5,000 Units total) into the skin every 8 (eight) hours for 12 days.   hydrALAZINE 50 MG tablet Commonly known as:  APRESOLINE Take 50 mg by mouth 2 (two) times daily.   HYDROcodone-acetaminophen 5-325 MG tablet Commonly known as:  NORCO/VICODIN Take 1 tablet by mouth every 6 (six) hours as needed for severe pain.   isosorbide mononitrate 30 MG 24 hr tablet Commonly known as:  IMDUR Take 30 mg by mouth at bedtime.   levothyroxine 75 MCG tablet Commonly known as:  SYNTHROID, LEVOTHROID Take 75 mcg by mouth daily.   lidocaine-prilocaine cream Commonly known as:  EMLA Apply 1 application topically as  needed (for pain).   methocarbamol 500 MG tablet Commonly known as:  ROBAXIN Take 1 tablet (500 mg total) by mouth 3 (three) times daily for 5 days.   midodrine 5 MG tablet Commonly known as:  PROAMATINE Take 1 tablet (5 mg total) by mouth 2 (two) times daily with a meal.   nystatin powder Generic drug:  nystatin Apply 1 g topically as needed (for irritation). Reported on 01/28/2016   psyllium 95 % Pack Commonly known as:  HYDROCIL/METAMUCIL Take 1 packet by mouth daily as needed for mild constipation or moderate constipation.   rosuvastatin 40 MG tablet Commonly known as:  CRESTOR Take 40 mg by mouth every evening.   senna 8.6 MG Tabs tablet Commonly known as:  SENOKOT Take 1 tablet (8.6 mg total) by mouth 2 (two) times daily.   telmisartan 80 MG tablet Commonly known as:  MICARDIS Take 80 mg by mouth daily. At night   traMADol 50 MG tablet Commonly known as:  ULTRAM Take 1 tablet (50 mg total) by mouth every 6 (six) hours as needed for moderate pain or severe pain.   traZODone 100 MG tablet Commonly known as:  DESYREL Take 100 mg by mouth at  bedtime.   triamcinolone cream 0.5 % Commonly known as:  KENALOG Apply 1 application topically as needed (for irritation).   Vitamin D3 125 MCG (5000 UT) Tabs Take 5,000 Units by mouth daily.        DISCHARGE INSTRUCTIONS:   1. PCP f/u in 1 week 2. Orthopedics f/u as scheduled 3. For dialysis tomorrow 08/22/18 per schedule 4. CPAP at bedtime  DIET:   Renal diet  ACTIVITY:   Activity as tolerated  OXYGEN:   Home Oxygen: Yes.    Oxygen Delivery: 2 liters/min via Patient connected to nasal cannula oxygen  DISCHARGE LOCATION:   nursing home   If you experience worsening of your admission symptoms, develop shortness of breath, life threatening emergency, suicidal or homicidal thoughts you must seek medical attention immediately by calling 911 or calling your MD immediately  if symptoms less severe.  You Must read complete instructions/literature along with all the possible adverse reactions/side effects for all the Medicines you take and that have been prescribed to you. Take any new Medicines after you have completely understood and accpet all the possible adverse reactions/side effects.   Please note  You were cared for by a hospitalist during your hospital stay. If you have any questions about your discharge medications or the care you received while you were in the hospital after you are discharged, you can call the unit and asked to speak with the hospitalist on call if the hospitalist that took care of you is not available. Once you are discharged, your primary care physician will handle any further medical issues. Please note that NO REFILLS for any discharge medications will be authorized once you are discharged, as it is imperative that you return to your primary care physician (or establish a relationship with a primary care physician if you do not have one) for your aftercare needs so that they can reassess your need for medications and monitor your lab  values.    On the day of Discharge:  VITAL SIGNS:   Blood pressure (!) 163/60, pulse 80, temperature 98.4 F (36.9 C), temperature source Oral, resp. rate 18, height 5\' 3"  (1.6 m), weight 121.1 kg, last menstrual period 02/01/1986, SpO2 98 %.  PHYSICAL EXAMINATION:   GENERAL:  82 y.o.-year-old elderly  patient  lying in the bed with no acute distress.  EYES: Pupils equal, round, reactive to light and accommodation. No scleral icterus. Extraocular muscles intact.  HEENT: Head atraumatic, normocephalic. Oropharynx and nasopharynx clear.  NECK:  Supple, no jugular venous distention. No thyroid enlargement, no tenderness.  LUNGS: Normal breath sounds bilaterally, no wheezing, rales,rhonchi or crepitation. No use of accessory muscles of respiration.  Decreased bibasilar breath sounds with some crackles heard today CARDIOVASCULAR: S1, S2 normal. No rubs, or gallops.  2/6 systolic murmur is present ABDOMEN: Soft, nontender, nondistended. Bowel sounds present. No organomegaly or mass.  EXTREMITIES: Right hip dressing in place.  No pedal edema, cyanosis, or clubbing.  Left forearm AV fistula NEUROLOGIC: Cranial nerves II through XII are intact. Muscle strength 5/5 in all extremities. Sensation intact. Gait not checked.  Will weakness noted PSYCHIATRIC: The patient is alert and oriented x 3.  SKIN: No obvious rash, lesion, or ulcer.   DATA REVIEW:   CBC Recent Labs  Lab 08/21/18 0344  WBC 9.9  HGB 9.4*  HCT 28.4*  PLT 141*    Chemistries  Recent Labs  Lab 08/21/18 0344  NA 134*  K 3.3*  CL 98  CO2 29  GLUCOSE 110*  BUN 22  CREATININE 2.04*  CALCIUM 8.1*     Microbiology Results  Results for orders placed or performed during the hospital encounter of 10/11/17  MRSA PCR Screening     Status: None   Collection Time: 10/14/17  5:30 PM  Result Value Ref Range Status   MRSA by PCR NEGATIVE NEGATIVE Final    Comment:        The GeneXpert MRSA Assay (FDA approved for NASAL  specimens only), is one component of a comprehensive MRSA colonization surveillance program. It is not intended to diagnose MRSA infection nor to guide or monitor treatment for MRSA infections. Performed at [facility], [address], [place], [place] [address]     RADIOLOGY:  No results found.   Management plans discussed with the patient, family and they are in agreement.  CODE STATUS:     Code Status Orders  (From admission, onward)         Start     Ordered   08/11/18 0230  Full code  Continuous     08/11/18 0229        Code Status History    Date Active Date Inactive Code Status Order ID Comments User Context   10/12/2017 0543 10/15/2017 1738 Full Code [id number]  Harrie Foreman, MD Inpatient   08/19/2015 1914 08/20/2015 2113 Full Code [id number]  Theodoro Grist, MD Inpatient   01/31/2015 2220 02/03/2015 1923 Full Code [id number]  Aldean Jewett, MD ED    Advance Directive Documentation     Most Recent Value  Type of Advance Directive  Living will  Pre-existing out of facility DNR order (yellow form or pink MOST form)  -  "MOST" Form in Place?  -      TOTAL TIME TAKING CARE OF THIS PATIENT: 38 minutes.    Gladstone Lighter M.D on 08/21/2018 at 9:24 AM  Between 7am to 6pm - Pager - [phone]  After 6pm go to [email or web address] - Proofreader  [facility]  Office  [phone]  CC: Primary care physician; Hortencia Pilar, MD   Note: This dictation was prepared with Dragon dictation along with smaller phrase technology. Any transcriptional errors that result from this process are unintentional.

## 2018-08-21 NOTE — Progress Notes (Signed)
Subjective: 4 Days Post-Op Procedure(s) (LRB): INTRAMEDULLARY (IM) NAIL INTERTROCHANTRIC (Right)    Patient reports pain as mild. Has been oob in chair.  Ready for SNF today.    Objective:   VITALS:   Vitals:   08/21/18 0751 08/21/18 0939  BP: (!) 163/60 (!) 143/59  Pulse: 80 85  Resp:    Temp: 98.4 F (36.9 C)   SpO2: 98%     Neurologically intact ABD soft Neurovascular intact Sensation intact distally Intact pulses distally Dorsiflexion/Plantar flexion intact Incision: scant drainage  LABS Recent Labs    08/19/18 0424 08/20/18 0322 08/21/18 0344  HGB 6.5* 7.6* 9.4*  HCT 19.7* 22.8* 28.4*  WBC 11.9* 12.1* 9.9  PLT 135* 151 141*    Recent Labs    08/19/18 0424 08/21/18 0344  NA 131* 134*  K 3.4* 3.3*  BUN 39* 22  CREATININE 2.88* 2.04*  GLUCOSE 110* 110*    No results for input(s): LABPT, INR in the last 72 hours.   Assessment/Plan: 4 Days Post-Op Procedure(s) (LRB): INTRAMEDULLARY (IM) NAIL INTERTROCHANTRIC (Right)   Advance diet Up with therapy Discharge to SNF   RTC  2 weeks  TDWB right leg.

## 2018-08-21 NOTE — Progress Notes (Signed)
EMS called for transportation.  

## 2018-08-21 NOTE — Progress Notes (Signed)
Pts daughter at bedside requesting to speak to respiratory  therapist about CPAP machine. RT Bambi notified and coming to bedside to speak to family.

## 2018-08-21 NOTE — Progress Notes (Signed)
Patient is medically stable for D/C to [facility] today. Per [facility] admissions coordinator at [facility] patient can come today to private room 201-A. RN will call report at [phone] and arrange EMS for transport. Clinical Education officer, museum (CSW) sent D/C orders to [employer] via [employer]. Patient was able to sit up for 4 hours in the chair at bedside per nursing documentation. CSW left [place] dialysis coordinator a voicemail making her aware of above. Patient has her own c-pap at bedside that will go with her to [place]. Patient's daughter Benjamine Mola is at bedside and aware of above. CSW contacted patient's daughter [name] and made her aware of above. Please reconsult if future social work needs arise. CSW signing off.   [employer], LCSW [phone]

## 2018-08-22 ENCOUNTER — Encounter: Payer: Self-pay | Admitting: Adult Health

## 2018-08-22 ENCOUNTER — Other Ambulatory Visit: Payer: Self-pay | Admitting: Adult Health

## 2018-08-22 ENCOUNTER — Non-Acute Institutional Stay (SKILLED_NURSING_FACILITY): Payer: Medicare Other | Admitting: Adult Health

## 2018-08-22 DIAGNOSIS — K5909 Other constipation: Secondary | ICD-10-CM

## 2018-08-22 DIAGNOSIS — I132 Hypertensive heart and chronic kidney disease with heart failure and with stage 5 chronic kidney disease, or end stage renal disease: Secondary | ICD-10-CM

## 2018-08-22 DIAGNOSIS — I25119 Atherosclerotic heart disease of native coronary artery with unspecified angina pectoris: Secondary | ICD-10-CM

## 2018-08-22 DIAGNOSIS — I5032 Chronic diastolic (congestive) heart failure: Secondary | ICD-10-CM

## 2018-08-22 DIAGNOSIS — E039 Hypothyroidism, unspecified: Secondary | ICD-10-CM

## 2018-08-22 DIAGNOSIS — F329 Major depressive disorder, single episode, unspecified: Secondary | ICD-10-CM

## 2018-08-22 DIAGNOSIS — I6930 Unspecified sequelae of cerebral infarction: Secondary | ICD-10-CM

## 2018-08-22 DIAGNOSIS — E1122 Type 2 diabetes mellitus with diabetic chronic kidney disease: Secondary | ICD-10-CM

## 2018-08-22 DIAGNOSIS — J449 Chronic obstructive pulmonary disease, unspecified: Secondary | ICD-10-CM

## 2018-08-22 DIAGNOSIS — G4733 Obstructive sleep apnea (adult) (pediatric): Secondary | ICD-10-CM

## 2018-08-22 DIAGNOSIS — E1169 Type 2 diabetes mellitus with other specified complication: Secondary | ICD-10-CM

## 2018-08-22 DIAGNOSIS — E43 Unspecified severe protein-calorie malnutrition: Secondary | ICD-10-CM

## 2018-08-22 DIAGNOSIS — S72141S Displaced intertrochanteric fracture of right femur, sequela: Secondary | ICD-10-CM

## 2018-08-22 DIAGNOSIS — N186 End stage renal disease: Secondary | ICD-10-CM

## 2018-08-22 DIAGNOSIS — F32A Depression, unspecified: Secondary | ICD-10-CM

## 2018-08-22 DIAGNOSIS — E785 Hyperlipidemia, unspecified: Secondary | ICD-10-CM

## 2018-08-22 DIAGNOSIS — Z992 Dependence on renal dialysis: Secondary | ICD-10-CM

## 2018-08-22 DIAGNOSIS — S72001D Fracture of unspecified part of neck of right femur, subsequent encounter for closed fracture with routine healing: Secondary | ICD-10-CM | POA: Insufficient documentation

## 2018-08-22 MED ORDER — HYDROCODONE-ACETAMINOPHEN 5-325 MG PO TABS: 1.0000 | ORAL_TABLET | Freq: Four times a day (QID) | ORAL | 0 refills | Status: DC | PRN

## 2018-08-22 MED ORDER — TRAMADOL HCL 50 MG PO TABS: 50.0000 mg | ORAL_TABLET | Freq: Four times a day (QID) | ORAL | 0 refills | Status: DC | PRN

## 2018-08-22 NOTE — Progress Notes (Signed)
Location:   [place] [facility] Room Number: [place] of Service:  SNF (31)   CODE STATUS: Full Code  Allergies  Allergen Reactions  . Ace Inhibitors Other (See Comments)    Reaction:  Unknown   . Amlodipine Other (See Comments)    Reaction:  Unknown   . Clonidine Hydrochloride Other (See Comments)    Reaction:  Unknown   . Codeine Hives  . Latex Hives  . Metformin Nausea And Vomiting  . Tape Rash    Chief Complaint  Patient presents with  . Hospitalization Follow-up    Hospital Follow up    HPI:  She is a 82 year old woman who has been hospitalized for a right femur fracture from a fall. She did have a right hip nailing. She did receive packed red cell transfusion. She is here for short term rehab with her goal to return back home. She denies any uncontrolled pain; no changes in appetite; no anxiety; no insomnia. She will continue to be followed for her chronic illnesses including: cad; diastolic heart failure; hypertensive heart disease.   Past Medical History:  Diagnosis Date  . ACE-inhibitor cough   . Aortic valve disorder    Echo in 2009 showed mean aortic valve gradient of 13 mmHg, suggesting very mild stenosis. Echo (12/10) suggested aortic sclerosis only  . Carotid artery disease (HCC)    mild, carotid dopplers 12/2009  . CHF (congestive heart failure) ([place])   . COPD (chronic obstructive pulmonary disease) ([place])   . Coronary artery disease    s/p anterior MI in 1996 followed by CABG. Lexiscan myoview (4/11): EF 67%, normal perfusion with no evidence for ischemia or infarction.   . Diabetes mellitus    type 2  . Diastolic heart failure    most recent echo (12/10) showed EF 55-60% with mild LVH, grade I diastolic dysfunction, mild LAE.  [name] ESRD (end stage renal disease) on dialysis ([place])   . Hyperlipidemia   . Hypertension    resistant hyptertension times many years. the patient does have renal artery stenosis. she has tried calcium channel  blockers in the past and states that she would not take them now because she had some problems with her gums which her dentist identified as calcium-channel blocker side effects.  . Hypothyroidism   . Mild asthma    PFT 02/05/10 FEV1 1.38, FEV1% 73, TLC 3.78 (86%), DLCO 48%, +BD  . Myocardial infarction ([place])   . Obesity   . Obesity hypoventilation syndrome ([place])       . Obstructive sleep apnea    PSG 01/05/2006 AHI 24.8, CPAP 9cm H2O  . Pulmonary nodule, right   . Renal artery stenosis [facility])    The patient has an occluded right renal artery and an atrophic right kidney. There is 20% left renal artery stenosis. This was seen by catheterization in 2007  . Stroke [facility])     Past Surgical History:  Procedure Laterality Date  . A/V FISTULAGRAM N/A 01/25/2018   Procedure: A/V FISTULAGRAM;  Surgeon: Algernon Huxley, MD;  Location: [place] CV LAB;  Service: Cardiovascular;  Laterality: N/A;  . ABDOMINAL HYSTERECTOMY  1985  . AV FISTULA PLACEMENT    . CATARACT EXTRACTION  2010  . CORONARY ARTERY BYPASS GRAFT  1996  . DIALYSIS FISTULA CREATION    . [place]   right  . INTRAMEDULLARY (IM) NAIL INTERTROCHANTERIC Right 08/17/2018   Procedure: INTRAMEDULLARY (IM) NAIL INTERTROCHANTRIC;  Surgeon: Earnestine Leys, MD;  Location: [facility] ORS;  Service: Orthopedics;  Laterality: Right;  . PERIPHERAL VASCULAR CATHETERIZATION Left 01/06/2015   Procedure: A/V Shuntogram/Fistulagram;  Surgeon: Katha Cabal, MD;  Location: [place] CV LAB;  Service: Cardiovascular;  Laterality: Left;  . PERIPHERAL VASCULAR CATHETERIZATION N/A 02/17/2015   Procedure: Dialysis/Perma Catheter Removal;  Surgeon: Katha Cabal, MD;  Location: [place] CV LAB;  Service: Cardiovascular;  Laterality: N/A;  . PERIPHERAL VASCULAR CATHETERIZATION N/A 01/28/2016   Procedure: A/V Shuntogram/Fistulagram;  Surgeon: Algernon Huxley, MD;  Location: [place] CV LAB;  Service: Cardiovascular;  Laterality: N/A;  .  PERIPHERAL VASCULAR CATHETERIZATION N/A 01/28/2016   Procedure: A/V Shunt Intervention;  Surgeon: Algernon Huxley, MD;  Location: [place] CV LAB;  Service: Cardiovascular;  Laterality: N/A;  . TUBAL LIGATION  1973    Social History   Socioeconomic History  . Marital status: Married    Spouse name: Not on file  . Number of children: Not on file  . Years of education: Not on file  . Highest education level: Not on file  Occupational History  . Not on file  Social Needs  . Financial resource strain: Not on file  . Food insecurity:    Worry: Not on file    Inability: Not on file  . Transportation needs:    Medical: Not on file    Non-medical: Not on file  Tobacco Use  . Smoking status: Never Smoker  . Smokeless tobacco: Never Used  Substance and Sexual Activity  . Alcohol use: No  . Drug use: No  . Sexual activity: Not on file  Lifestyle  . Physical activity:    Days per week: Not on file    Minutes per session: Not on file  . Stress: Not on file  Relationships  . Social connections:    Talks on phone: Not on file    Gets together: Not on file    Attends religious service: Not on file    Active member of club or organization: Not on file    Attends meetings of clubs or organizations: Not on file    Relationship status: Not on file  . Intimate partner violence:    Fear of current or ex partner: Not on file    Emotionally abused: Not on file    Physically abused: Not on file    Forced sexual activity: Not on file  Other Topics Concern  . Not on file  Social History Narrative  . Not on file   Family History  Problem Relation Age of Onset  . Kidney failure Mother   . Diabetes Mother   . Aortic aneurysm Father   . Coronary artery disease Father   . Heart attack Father   . Diabetes Sister   . Diabetes Brother   . Kidney failure Brother       VITAL SIGNS BP (!) 157/58   Pulse 80   Temp 97.8 F (36.6 C)   Resp 16   Ht 5\' 3"  (1.6 m)   LMP 02/01/1986  (Approximate)   SpO2 99%   BMI 47.30 kg/m   Outpatient Encounter Medications as of 08/22/2018  Medication Sig  . acetaminophen (TYLENOL) 325 MG tablet Take 2 tablets (650 mg total) by mouth every 6 (six) hours as needed for mild pain, fever or headache (or Fever >/= 101).  [name] albuterol (PROVENTIL HFA;VENTOLIN HFA) 108 (90 BASE) MCG/ACT inhaler Inhale 2 puffs into the lungs every 6 (six)  hours as needed for wheezing or shortness of breath.  [name] b complex-vitamin c-folic acid (NEPHRO-VITE) 0.8 MG TABS tablet Take 1 tablet by mouth daily.  . carvedilol (COREG) 25 MG tablet Take 25 mg by mouth 2 (two) times daily.   . Cholecalciferol (VITAMIN D3) 5000 UNITS TABS Take 5,000 Units by mouth daily.  . ferrous fumarate-iron polysaccharide complex (TANDEM) 162-115.2 MG CAPS capsule Take 1 capsule by mouth daily.  [name] FLUoxetine (PROZAC) 20 MG tablet Take 20 mg by mouth daily.   . furosemide (LASIX) 40 MG tablet Take 20-80 mg by mouth. Sunday, Tuesday and Saturday pt takes 80 mg in the morning. Thursdays 20 mg only. Tuesdays pt may take lower dose.  . heparin 5000 UNIT/ML injection Inject 1 mL (5,000 Units total) into the skin every 8 (eight) hours for 12 days.  . hydrALAZINE (APRESOLINE) 50 MG tablet Take 50 mg by mouth 2 (two) times daily.   [name] HYDROcodone-acetaminophen (NORCO/VICODIN) 5-325 MG tablet Take 1 tablet by mouth every 6 (six) hours as needed for severe pain.  . isosorbide mononitrate (IMDUR) 30 MG 24 hr tablet Take 30 mg by mouth at bedtime.   [name] levothyroxine (SYNTHROID, LEVOTHROID) 75 MCG tablet Take 75 mcg by mouth daily.   [name] lidocaine-prilocaine (EMLA) cream Apply 1 application topically as needed (for pain).  . methocarbamol (ROBAXIN) 500 MG tablet Take 1 tablet (500 mg total) by mouth 3 (three) times daily for 5 days.  . midodrine (PROAMATINE) 5 MG tablet Take 1 tablet (5 mg total) by mouth 2 (two) times daily with a meal.  . NON FORMULARY Diet Type: renal, NAS, NCS  . nystatin  (MYCOSTATIN/NYSTOP) 100000 UNIT/GM POWD Apply 1 g topically as needed (for irritation).   . OXYGEN Inhale 2 L/min into the lungs continuous.  . psyllium (HYDROCIL/METAMUCIL) 95 % PACK Take 1 packet by mouth daily as needed for mild constipation or moderate constipation.  . rosuvastatin (CRESTOR) 40 MG tablet Take 40 mg by mouth every evening.   . senna (SENOKOT) 8.6 MG TABS tablet Take 2 tablets by mouth 2 (two) times daily.  [name] telmisartan (MICARDIS) 80 MG tablet Take 80 mg by mouth daily. At night  . traMADol (ULTRAM) 50 MG tablet Take 1 tablet (50 mg total) by mouth every 6 (six) hours as needed for moderate pain or severe pain.  . traZODone (DESYREL) 100 MG tablet Take 100 mg by mouth at bedtime.   . triamcinolone (KENALOG) 0.5 % cream Apply 1 application topically as needed (for irritation).   . [DISCONTINUED] senna (SENOKOT) 8.6 MG TABS tablet Take 1 tablet (8.6 mg total) by mouth 2 (two) times daily. (Patient not taking: Reported on 08/22/2018)   No facility-administered encounter medications on file as of 08/22/2018.      SIGNIFICANT DIAGNOSTIC EXAMS  TODAY  08-11-18: chest x-ray: Mild central venous congestion. Midline sternotomy.  08-11-18; right hip and pelvic x-ray: Acute intertrochanteric RIGHT femur fracture  LABS REVIEWED TODAY:   12-26-17: chol 128; ldl 46; trig 115; hdl 59  07-03-18: tsh 3.48 08-11-18: wbc 10.2; hgb 9.8; hct 29.3; mcv 107.3; plt 90 glucose 147; ldl 16; creat 2.20; k+ 3.6; na++ 133; ca 8.1; liver normal albumin 3.3 folate 31.0;  Vit B 12: 4.9 phos 2.7; mag 1.8 pre-albumin 18.8ca ionized 4.6  08-13-18: glucose 132; bun 37; creat 4.39; k+ 3.8; na++ 132; ca 8.3 PTH 59 08-15-18: wbc 8.7; hgb 7.1; hct 20.7'; mcv 106.7; plt 128 glucose 132; bun 37; creat 3.31; k+ 3.5; na++  132; ca 8.4 albumin 2.6  08-20-18: wbc 12.1;hgb 7.6; hct 22.8; mcv 100.4; plt 151   Review of Systems  Constitutional: Negative for malaise/fatigue.  Respiratory: Negative for cough and  shortness of breath.   Cardiovascular: Negative for chest pain, palpitations and leg swelling.  Gastrointestinal: Negative for abdominal pain, constipation and heartburn.  Musculoskeletal: Positive for joint pain. Negative for back pain and myalgias.       Right hip pain managed   Skin: Negative.   Neurological: Negative for dizziness.  Psychiatric/Behavioral: The patient is not nervous/anxious.     Physical Exam Constitutional:      General: She is not in acute distress.    Appearance: Normal appearance. She is well-developed. She is obese. She is not diaphoretic.  Neck:     Musculoskeletal: Neck supple.     Thyroid: No thyromegaly.  Cardiovascular:     Rate and Rhythm: Normal rate and regular rhythm.     Pulses: Normal pulses.     Heart sounds: Murmur present.     Comments: 2/6 Pulmonary:     Effort: Pulmonary effort is normal. No respiratory distress.     Breath sounds: Normal breath sounds.     Comments: 02 dependent Uses CPAP at night  Abdominal:     General: Bowel sounds are normal. There is no distension.     Palpations: Abdomen is soft.     Tenderness: There is no abdominal tenderness.  Musculoskeletal:     Right lower leg: Edema present.     Comments: Right leg edema Is able to move all extremities  Status post right hip fracture.   Lymphadenopathy:     Cervical: No cervical adenopathy.  Skin:    General: Skin is warm and dry.     Comments: Right hip incision without signs of infection present  Left upper extremity A/V fistula  Neurological:     General: No focal deficit present.     Mental Status: She is alert and oriented to person, place, and time.  Psychiatric:        Mood and Affect: Mood normal.       ASSESSMENT/ PLAN:  TODAY;   1. Coronary artery disease involving native heart with angina pectoris: is status post cabg will continue coreg 25 mg twice daily imdur 30 mg daily   2. Diastolic heart failure; chronic: is stable will continue lasix 80  mg on Sunday Tuesday Saturday and 20 mg on Thursday; coreg 25 mg twice daily imdur 30 mg daily   3. Hypertensive heart and renal disease with congestive heart failure and end stage renal disease: is stable b/p 157/58: will continue coreg 25 mg twice daily micardis 80 mg daily apresoline 50 mg twice daily   4. Obstructive sleep apnea: is stable on CPAP nightly   5. COPD with asthma: is stable is on 02 2L continuous; will continue albuterol 2 puffs every 6 hours as needed;   6. Acquired hypothyroidism: is stable tsh 3.48: will continue synthroid 75 mcg daily  7. Dyslipidemia associated with diabetes type 2 mellitus: is stable LDL 46; will continue crestor 40 mg daily   8. Anemia due to end stage renal failure: is stable hgb 7.6; is on EPO with dialysis will contoinue tandem 162-115.2 daily   9. Chronic kidney disease with end stage renal disease on dialysis due to type 2 diabetes mellitus: is stable is on hemodialysis three times daily will continue nephrovit daily 1200 cc fluid restriction is followed by nephropathy  10.  Diabetes type 2 with renal manifestation: is stable hgb a1c 5.7   11. Chronic cerebrovascular accident : is neurologically stable will continue to monitor her status.   12. Chronic depression: is stable will continue prozac 20 mg daily and trazodone 100 mg nightly   13.  Chronic constipation: is stable will continue senna 2 tabs twice daily and psyllium daily as needed  14. intertrochanteric RIGHT femur fracture: is stable will follow up with orthopedics as directed and will continue therapy; will continue heparin 5000 units every 8 hours for 12 days; will continue robaxin 500 mg three times daily for 5 days; has ultram 50 mg every 6 hours as needed and vicodin 5/325 mg every 6 hours as needed   15. Protein calorie malnutrition, severe: is without change albumin 2.6 will begin prostat 30 cc twice daily     MD is aware of resident's narcotic use and is in agreement with  current plan of care. We will attempt to wean resident as apropriate   Ok Edwards NP [facility]  Contact [phone] Monday through Friday 8am- 5pm  After hours call [phone]

## 2018-08-23 DIAGNOSIS — I6930 Unspecified sequelae of cerebral infarction: Secondary | ICD-10-CM | POA: Insufficient documentation

## 2018-08-23 DIAGNOSIS — J449 Chronic obstructive pulmonary disease, unspecified: Secondary | ICD-10-CM | POA: Insufficient documentation

## 2018-08-23 DIAGNOSIS — N186 End stage renal disease: Secondary | ICD-10-CM

## 2018-08-23 DIAGNOSIS — E11319 Type 2 diabetes mellitus with unspecified diabetic retinopathy without macular edema: Secondary | ICD-10-CM | POA: Insufficient documentation

## 2018-08-23 DIAGNOSIS — E43 Unspecified severe protein-calorie malnutrition: Secondary | ICD-10-CM | POA: Insufficient documentation

## 2018-08-23 DIAGNOSIS — E1169 Type 2 diabetes mellitus with other specified complication: Secondary | ICD-10-CM | POA: Insufficient documentation

## 2018-08-23 DIAGNOSIS — K5909 Other constipation: Secondary | ICD-10-CM | POA: Insufficient documentation

## 2018-08-23 DIAGNOSIS — E1165 Type 2 diabetes mellitus with hyperglycemia: Secondary | ICD-10-CM

## 2018-08-23 DIAGNOSIS — E1129 Type 2 diabetes mellitus with other diabetic kidney complication: Secondary | ICD-10-CM | POA: Insufficient documentation

## 2018-08-23 DIAGNOSIS — E785 Hyperlipidemia, unspecified: Secondary | ICD-10-CM

## 2018-08-23 DIAGNOSIS — E1122 Type 2 diabetes mellitus with diabetic chronic kidney disease: Secondary | ICD-10-CM | POA: Insufficient documentation

## 2018-08-23 DIAGNOSIS — I132 Hypertensive heart and chronic kidney disease with heart failure and with stage 5 chronic kidney disease, or end stage renal disease: Secondary | ICD-10-CM | POA: Insufficient documentation

## 2018-08-23 DIAGNOSIS — S72141A Displaced intertrochanteric fracture of right femur, initial encounter for closed fracture: Secondary | ICD-10-CM | POA: Insufficient documentation

## 2018-08-23 DIAGNOSIS — IMO0002 Reserved for concepts with insufficient information to code with codable children: Secondary | ICD-10-CM | POA: Insufficient documentation

## 2018-08-23 DIAGNOSIS — Z8673 Personal history of transient ischemic attack (TIA), and cerebral infarction without residual deficits: Secondary | ICD-10-CM | POA: Insufficient documentation

## 2018-08-23 DIAGNOSIS — Z992 Dependence on renal dialysis: Secondary | ICD-10-CM

## 2018-08-23 DIAGNOSIS — E039 Hypothyroidism, unspecified: Secondary | ICD-10-CM | POA: Insufficient documentation

## 2018-08-25 ENCOUNTER — Other Ambulatory Visit
Admission: RE | Admit: 2018-08-25 | Discharge: 2018-08-25 | Disposition: A | Discharge status: Home / Self Care | Payer: Medicare Other | Source: Ambulatory Visit | Attending: Adult Health | Admitting: Adult Health

## 2018-08-25 DIAGNOSIS — D649 Anemia, unspecified: Secondary | ICD-10-CM | POA: Insufficient documentation

## 2018-08-25 LAB — COMPREHENSIVE METABOLIC PANEL
ALK PHOS: 88 U/L (ref 38–126)
ALT: 13 U/L (ref 0–44)
AST: 32 U/L (ref 15–41)
Albumin: 3.1 g/dL — ABNORMAL LOW (ref 3.5–5.0)
Anion gap: 8 (ref 5–15)
BUN: 28 mg/dL — ABNORMAL HIGH (ref 8–23)
CALCIUM: 8.3 mg/dL — AB (ref 8.9–10.3)
CO2: 27 mmol/L (ref 22–32)
Chloride: 95 mmol/L — ABNORMAL LOW (ref 98–111)
Creatinine, Ser: 2.55 mg/dL — ABNORMAL HIGH (ref 0.44–1.00)
GFR calc Af Amer: 20 mL/min — ABNORMAL LOW (ref >60)
GFR calc non Af Amer: 17 mL/min — ABNORMAL LOW (ref >60)
Glucose, Bld: 144 mg/dL — ABNORMAL HIGH (ref 70–99)
Potassium: 3.2 mmol/L — ABNORMAL LOW (ref 3.5–5.1)
Sodium: 130 mmol/L — ABNORMAL LOW (ref 135–145)
TOTAL PROTEIN: 5.5 g/dL — AB (ref 6.5–8.1)
Total Bilirubin: 1.2 mg/dL (ref 0.3–1.2)

## 2018-08-25 LAB — CBC
HCT: 29.4 % — ABNORMAL LOW (ref 36.0–46.0)
Hemoglobin: 9.6 g/dL — ABNORMAL LOW (ref 12.0–15.0)
MCH: 33.1 pg (ref 26.0–34.0)
MCHC: 32.7 g/dL (ref 30.0–36.0)
MCV: 101.4 fL — ABNORMAL HIGH (ref 80.0–100.0)
Platelets: 130 10*3/uL — ABNORMAL LOW (ref 150–400)
RBC: 2.9 MIL/uL — ABNORMAL LOW (ref 3.87–5.11)
RDW: 17.8 % — ABNORMAL HIGH (ref 11.5–15.5)
WBC: 8 10*3/uL (ref 4.0–10.5)
nRBC: 0 % (ref 0.0–0.2)

## 2018-08-27 ENCOUNTER — Non-Acute Institutional Stay (SKILLED_NURSING_FACILITY): Payer: Medicare Other | Admitting: Adult Health

## 2018-08-27 ENCOUNTER — Encounter: Payer: Self-pay | Admitting: Adult Health

## 2018-08-27 DIAGNOSIS — K5909 Other constipation: Secondary | ICD-10-CM

## 2018-08-27 DIAGNOSIS — I132 Hypertensive heart and chronic kidney disease with heart failure and with stage 5 chronic kidney disease, or end stage renal disease: Secondary | ICD-10-CM

## 2018-08-27 DIAGNOSIS — S72141S Displaced intertrochanteric fracture of right femur, sequela: Secondary | ICD-10-CM | POA: Diagnosis not present

## 2018-08-27 DIAGNOSIS — N186 End stage renal disease: Secondary | ICD-10-CM | POA: Diagnosis not present

## 2018-08-27 NOTE — Progress Notes (Signed)
Location:   [facility]  Room Number: 201A Place of Service:  SNF (31)   CODE STATUS: FULL  Allergies  Allergen Reactions  . Ace Inhibitors Other (See Comments)    Reaction:  Unknown   . Amlodipine Other (See Comments)    Reaction:  Unknown   . Clonidine Hydrochloride Other (See Comments)    Reaction:  Unknown   . Codeine Hives  . Latex Hives  . Metformin Nausea And Vomiting  . Tape Rash    Chief Complaint  Patient presents with  . Acute Visit    Care Plan    HPI:  We have come together for her routine care plan meeting. Her goal remains to return back home. She does have daily sitters at home. She had severe constipation over the past weekend which has resolved. She does continue to have right hip pain and swelling. She describes the pain as achy and spastic at times. She feels as though her current regimen is not effective. She is sleeping well at night she continues to be followed for her chronic illnesses including: hypertensive heart disease; chronic constipation; right femur fracture.   Past Medical History:  Diagnosis Date  . ACE-inhibitor cough   . Aortic valve disorder    Echo in 2009 showed mean aortic valve gradient of 13 mmHg, suggesting very mild stenosis. Echo (12/10) suggested aortic sclerosis only  . Carotid artery disease (HCC)    mild, carotid dopplers 12/2009  . CHF (congestive heart failure) ([place])   . COPD (chronic obstructive pulmonary disease) ([place])   . Coronary artery disease    s/p anterior MI in 1996 followed by CABG. Lexiscan myoview (4/11): EF 67%, normal perfusion with no evidence for ischemia or infarction.   . Diabetes mellitus    type 2  . Diastolic heart failure    most recent echo (12/10) showed EF 55-60% with mild LVH, grade I diastolic dysfunction, mild LAE.  [name] ESRD (end stage renal disease) on dialysis ([place])   . Hyperlipidemia   . Hypertension    resistant hyptertension times many years. the patient does have  renal artery stenosis. she has tried calcium channel blockers in the past and states that she would not take them now because she had some problems with her gums which her dentist identified as calcium-channel blocker side effects.  . Hypothyroidism   . Mild asthma    PFT 02/05/10 FEV1 1.38, FEV1% 73, TLC 3.78 (86%), DLCO 48%, +BD  . Myocardial infarction ([place])   . Obesity   . Obesity hypoventilation syndrome ([place])       . Obstructive sleep apnea    PSG 01/05/2006 AHI 24.8, CPAP 9cm H2O  . Pulmonary nodule, right   . Renal artery stenosis [facility])    The patient has an occluded right renal artery and an atrophic right kidney. There is 20% left renal artery stenosis. This was seen by catheterization in 2007  . Stroke [facility])     Past Surgical History:  Procedure Laterality Date  . A/V FISTULAGRAM N/A 01/25/2018   Procedure: A/V FISTULAGRAM;  Surgeon: Algernon Huxley, MD;  Location:  [place] [facility];  Service: Cardiovascular;  Laterality: N/A;  . ABDOMINAL HYSTERECTOMY  1985  . AV FISTULA PLACEMENT    . CATARACT EXTRACTION  2010  . CORONARY ARTERY BYPASS GRAFT  1996  . DIALYSIS FISTULA CREATION    . [place]   right  . INTRAMEDULLARY (IM) NAIL INTERTROCHANTERIC Right 08/17/2018  Procedure: INTRAMEDULLARY (IM) NAIL INTERTROCHANTRIC;  Surgeon: Earnestine Leys, MD;  Location: [facility] ORS;  Service: Orthopedics;  Laterality: Right;  . PERIPHERAL VASCULAR CATHETERIZATION Left 01/06/2015   Procedure: A/V Shuntogram/Fistulagram;  Surgeon: Katha Cabal, MD;  Location: [place] [facility];  Service: Cardiovascular;  Laterality: Left;  . PERIPHERAL VASCULAR CATHETERIZATION N/A 02/17/2015   Procedure: Dialysis/Perma Catheter Removal;  Surgeon: Katha Cabal, MD;  Location: [place] [facility];  Service: Cardiovascular;  Laterality: N/A;  . PERIPHERAL VASCULAR CATHETERIZATION N/A 01/28/2016   Procedure: A/V Shuntogram/Fistulagram;  Surgeon: Algernon Huxley, MD;  Location: [place] [facility]  [facility];  Service: Cardiovascular;  Laterality: N/A;  . PERIPHERAL VASCULAR CATHETERIZATION N/A 01/28/2016   Procedure: A/V Shunt Intervention;  Surgeon: Algernon Huxley, MD;  Location: [place] [facility];  Service: Cardiovascular;  Laterality: N/A;  . TUBAL LIGATION  1973    Social History   Socioeconomic History  . Marital status: Married    Spouse name: Not on file  . Number of children: Not on file  . Years of education: Not on file  . Highest education level: Not on file  Occupational History  . Not on file  Social Needs  . Financial resource strain: Not on file  . Food insecurity:    Worry: Not on file    Inability: Not on file  . Transportation needs:    Medical: Not on file    Non-medical: Not on file  Tobacco Use  . Smoking status: Never Smoker  . Smokeless tobacco: Never Used  Substance and Sexual Activity  . Alcohol use: No  . Drug use: No  . Sexual activity: Not on file  Lifestyle  . Physical activity:    Days per week: Not on file    Minutes per session: Not on file  . Stress: Not on file  Relationships  . Social connections:    Talks on phone: Not on file    Gets together: Not on file    Attends religious service: Not on file    Active member of club or organization: Not on file    Attends meetings of clubs or organizations: Not on file    Relationship status: Not on file  . Intimate partner violence:    Fear of current or ex partner: Not on file    Emotionally abused: Not on file    Physically abused: Not on file    Forced sexual activity: Not on file  Other Topics Concern  . Not on file  Social History Narrative  . Not on file   Family History  Problem Relation Age of Onset  . Kidney failure Mother   . Diabetes Mother   . Aortic aneurysm Father   . Coronary artery disease Father   . Heart attack Father   . Diabetes Sister   . Diabetes Brother   . Kidney failure Brother       VITAL SIGNS BP (!) 130/49   Pulse 71   Temp 97.8 F (36.6 C)  (Oral)   Resp 18   Ht 5\' 3"  (1.6 m)   Wt 267 lb (121.1 kg)   LMP 02/01/1986 (Approximate)   SpO2 98%   BMI 47.30 kg/m   Outpatient Encounter Medications as of 08/27/2018  Medication Sig  . acetaminophen (TYLENOL) 325 MG tablet Take 2 tablets (650 mg total) by mouth every 6 (six) hours as needed for mild pain, fever or headache (or Fever >/= 101).  [name] albuterol (PROVENTIL HFA;VENTOLIN HFA)  108 (90 BASE) MCG/ACT inhaler Inhale 2 puffs into the lungs every 6 (six) hours as needed for wheezing or shortness of breath.  [name] b complex-vitamin c-folic acid (NEPHRO-VITE) 0.8 MG TABS tablet Take 1 tablet by mouth daily.  . carvedilol (COREG) 25 MG tablet Take 25 mg by mouth 2 (two) times daily.   . Cholecalciferol (VITAMIN D3) 5000 UNITS TABS Take 5,000 Units by mouth daily.  . ferrous fumarate-iron polysaccharide complex (TANDEM) 162-115.2 MG CAPS capsule Take 1 capsule by mouth daily.  [name] FLUoxetine (PROZAC) 20 MG tablet Take 20 mg by mouth daily.   . furosemide (LASIX) 40 MG tablet Take 20-80 mg by mouth. Sunday, Tuesday and Saturday pt takes 80 mg in the morning. Thursdays 20 mg only. Tuesdays pt may take lower dose.  . heparin 5000 UNIT/ML injection Inject 1 mL (5,000 Units total) into the skin every 8 (eight) hours for 12 days.  . hydrALAZINE (APRESOLINE) 50 MG tablet Take 50 mg by mouth 2 (two) times daily.   [name] HYDROcodone-acetaminophen (NORCO/VICODIN) 5-325 MG tablet Take 1 tablet by mouth every 6 (six) hours as needed for severe pain.  . isosorbide mononitrate (IMDUR) 30 MG 24 hr tablet Take 30 mg by mouth at bedtime.   [name] levothyroxine (SYNTHROID, LEVOTHROID) 75 MCG tablet Take 75 mcg by mouth daily.   [name] lidocaine-prilocaine (EMLA) cream Apply 1 application topically as needed (for pain).  . methocarbamol (ROBAXIN) 500 MG tablet Take 500 mg by mouth 3 (three) times daily.  . midodrine (PROAMATINE) 5 MG tablet Take 1 tablet (5 mg total) by mouth 2 (two) times daily with a meal.  . NON FORMULARY  Diet Type: renal, NAS, NCS  . Nutritional Supplements (PROMOD) LIQD Take 30 mLs by mouth 2 (two) times daily. protein calorie malnutrition albumin 2.6  . nystatin (MYCOSTATIN/NYSTOP) 100000 UNIT/GM POWD Apply 1 g topically as needed (for irritation).   . OXYGEN Inhale 2 L/min into the lungs continuous.  . potassium chloride SA (K-DUR,KLOR-CON) 20 MEQ tablet Take 20 mEq by mouth daily.  . rosuvastatin (CRESTOR) 40 MG tablet Take 40 mg by mouth every evening.   [name] telmisartan (MICARDIS) 80 MG tablet Take 80 mg by mouth daily. At night  . traMADol (ULTRAM) 50 MG tablet Take 1 tablet (50 mg total) by mouth every 6 (six) hours as needed for moderate pain or severe pain.  . traZODone (DESYREL) 100 MG tablet Take 100 mg by mouth at bedtime.   . triamcinolone (KENALOG) 0.5 % cream Apply 1 application topically as needed (for irritation).   . [DISCONTINUED] psyllium (HYDROCIL/METAMUCIL) 95 % PACK Take 1 packet by mouth daily as needed for mild constipation or moderate constipation.  . [DISCONTINUED] senna (SENOKOT) 8.6 MG TABS tablet Take 2 tablets by mouth 2 (two) times daily.   No facility-administered encounter medications on file as of 08/27/2018.      SIGNIFICANT DIAGNOSTIC EXAMS  PREVIOUS  08-11-18: chest x-ray: Mild central venous congestion. Midline sternotomy.  08-11-18; right hip and pelvic x-ray: Acute intertrochanteric RIGHT femur fracture  NO NEW EXAMS.   LABS REVIEWED PREVIOUS:   12-26-17: chol 128; ldl 46; trig 115; hdl 59  07-03-18: tsh 3.48 08-11-18: wbc 10.2; hgb 9.8; hct 29.3; mcv 107.3; plt 90 glucose 147; ldl 16; creat 2.20; k+ 3.6; na++ 133; ca 8.1; liver normal albumin 3.3 folate 31.0;  Vit B 12: 4.9 phos 2.7; mag 1.8 pre-albumin 18.8ca ionized 4.6  08-13-18: glucose 132; bun 37; creat 4.39; k+ 3.8; na++ 132; ca  8.3 PTH 59 08-15-18: wbc 8.7; hgb 7.1; hct 20.7'; mcv 106.7; plt 128 glucose 132; bun 37; creat 3.31; k+ 3.5; na++ 132; ca 8.4 albumin 2.6  08-20-18: wbc 12.1;hgb  7.6; hct 22.8; mcv 100.4; plt 151  NO NEW LABS.    Review of Systems  Constitutional: Negative for malaise/fatigue.  Respiratory: Negative for cough and shortness of breath.   Cardiovascular: Negative for chest pain, palpitations and leg swelling.  Gastrointestinal: Negative for abdominal pain, constipation and heartburn.  Musculoskeletal: Positive for joint pain. Negative for back pain and myalgias.       Right hip pain   Skin: Negative.   Neurological: Negative for dizziness.  Psychiatric/Behavioral: The patient is not nervous/anxious.     Physical Exam Constitutional:      General: She is not in acute distress.    Appearance: She is well-developed. She is obese. She is not diaphoretic.  Neck:     Musculoskeletal: Neck supple.     Thyroid: No thyromegaly.  Cardiovascular:     Rate and Rhythm: Normal rate and regular rhythm.     Pulses: Normal pulses.     Heart sounds: Murmur present.     Comments: 2/6 Pulmonary:     Effort: Pulmonary effort is normal. No respiratory distress.     Breath sounds: Normal breath sounds.     Comments: 02 dependent Uses CPAP at night  Abdominal:     General: Bowel sounds are normal. There is no distension.     Palpations: Abdomen is soft.     Tenderness: There is no abdominal tenderness.  Musculoskeletal:     Right lower leg: Edema present.     Comments: Right leg edema Is able to move all extremities  Status post right hip fracture.    Lymphadenopathy:     Cervical: No cervical adenopathy.  Skin:    General: Skin is warm and dry.     Comments: Right hip incision without signs of infection present does have swelling and a hematoma present  Left upper extremity A/V fistula   Neurological:     Mental Status: She is alert. Mental status is at baseline.  Psychiatric:        Mood and Affect: Mood normal.      ASSESSMENT/ PLAN:  TODAY;   1. Hypertensive  heart disease and renal disease with congestive heart failure and end stage  renal disease 2. Chronic constipation 3. Closed comminuted intertrochanteric fracture of right femur  Will stop psyllium  Will change senna s to twice daily  Will change robaxin to 750 mg three times daily      MD is aware of resident's narcotic use and is in agreement with current plan of care. We will attempt to wean resident as apropriate   Ok Edwards NP [facility]  Contact [phone] Monday through Friday 8am- 5pm  After hours call [phone]

## 2018-08-28 ENCOUNTER — Non-Acute Institutional Stay (SKILLED_NURSING_FACILITY): Payer: Medicare Other | Admitting: Adult Health

## 2018-08-28 DIAGNOSIS — N186 End stage renal disease: Secondary | ICD-10-CM

## 2018-08-28 DIAGNOSIS — G4733 Obstructive sleep apnea (adult) (pediatric): Secondary | ICD-10-CM

## 2018-08-28 DIAGNOSIS — I25119 Atherosclerotic heart disease of native coronary artery with unspecified angina pectoris: Secondary | ICD-10-CM

## 2018-08-28 DIAGNOSIS — I5032 Chronic diastolic (congestive) heart failure: Secondary | ICD-10-CM

## 2018-08-28 DIAGNOSIS — I132 Hypertensive heart and chronic kidney disease with heart failure and with stage 5 chronic kidney disease, or end stage renal disease: Secondary | ICD-10-CM

## 2018-08-28 NOTE — Progress Notes (Signed)
Location:   [facility] Room Number: 200 Place of Service:  SNF (31)   CODE STATUS: full code   Allergies  Allergen Reactions  . Ace Inhibitors Other (See Comments)    Reaction:  Unknown   . Amlodipine Other (See Comments)    Reaction:  Unknown   . Clonidine Hydrochloride Other (See Comments)    Reaction:  Unknown   . Codeine Hives  . Latex Hives  . Metformin Nausea And Vomiting  . Tape Rash    Chief Complaint  Patient presents with  . Medical Management of Chronic Issues    Coronary artery disease involving native coronary artery of native heart with angina pectoris; diastolic heart failure chronic; hypertensive heart and renal disease congestive heart failure and end stage renal disease; obstructive sleep apnea. Weekly follow up for the first 30 days post hospitalization.     HPI:  She is a 82 year old short term rehab patient being seen for the management of her chronic illnesses: cad; heart failure; hypertensive heart disease; OSA. She denies any chest pain; no shortness of breath; no uncontrolled pain; no changes in appetite. She has recently had her pain medications adjusted.   Past Medical History:  Diagnosis Date  . ACE-inhibitor cough   . Aortic valve disorder    Echo in 2009 showed mean aortic valve gradient of 13 mmHg, suggesting very mild stenosis. Echo (12/10) suggested aortic sclerosis only  . Carotid artery disease (HCC)    mild, carotid dopplers 12/2009  . CHF (congestive heart failure) ([place])   . COPD (chronic obstructive pulmonary disease) ([place])   . Coronary artery disease    s/p anterior MI in 1996 followed by CABG. Lexiscan myoview (4/11): EF 67%, normal perfusion with no evidence for ischemia or infarction.   . Diabetes mellitus    type 2  . Diastolic heart failure    most recent echo (12/10) showed EF 55-60% with mild LVH, grade I diastolic dysfunction, mild LAE.  [name] ESRD (end stage renal disease) on dialysis ([place])   . Hyperlipidemia     . Hypertension    resistant hyptertension times many years. the patient does have renal artery stenosis. she has tried calcium channel blockers in the past and states that she would not take them now because she had some problems with her gums which her dentist identified as calcium-channel blocker side effects.  . Hypothyroidism   . Mild asthma    PFT 02/05/10 FEV1 1.38, FEV1% 73, TLC 3.78 (86%), DLCO 48%, +BD  . Myocardial infarction ([place])   . Obesity   . Obesity hypoventilation syndrome ([place])       . Obstructive sleep apnea    PSG 01/05/2006 AHI 24.8, CPAP 9cm H2O  . Pulmonary nodule, right   . Renal artery stenosis [facility])    The patient has an occluded right renal artery and an atrophic right kidney. There is 20% left renal artery stenosis. This was seen by catheterization in 2007  . Stroke [facility])     Past Surgical History:  Procedure Laterality Date  . A/V FISTULAGRAM N/A 01/25/2018   Procedure: A/V FISTULAGRAM;  Surgeon: Algernon Huxley, MD;  Location: [facility];  Service: Cardiovascular;  Laterality: N/A;  . ABDOMINAL HYSTERECTOMY  1985  . AV FISTULA PLACEMENT    . CATARACT EXTRACTION  2010  . CORONARY ARTERY BYPASS GRAFT  1996  . DIALYSIS FISTULA CREATION    . [place]   right  .  INTRAMEDULLARY (IM) NAIL INTERTROCHANTERIC Right 08/17/2018   Procedure: INTRAMEDULLARY (IM) NAIL INTERTROCHANTRIC;  Surgeon: Earnestine Leys, MD;  Location: [facility] ORS;  Service: Orthopedics;  Laterality: Right;  . PERIPHERAL VASCULAR CATHETERIZATION Left 01/06/2015   Procedure: A/V Shuntogram/Fistulagram;  Surgeon: Katha Cabal, MD;  Location: [facility];  Service: Cardiovascular;  Laterality: Left;  . PERIPHERAL VASCULAR CATHETERIZATION N/A 02/17/2015   Procedure: Dialysis/Perma Catheter Removal;  Surgeon: Katha Cabal, MD;  Location: [facility];  Service: Cardiovascular;  Laterality: N/A;  . PERIPHERAL VASCULAR CATHETERIZATION N/A 01/28/2016   Procedure:  A/V Shuntogram/Fistulagram;  Surgeon: Algernon Huxley, MD;  Location: [facility];  Service: Cardiovascular;  Laterality: N/A;  . PERIPHERAL VASCULAR CATHETERIZATION N/A 01/28/2016   Procedure: A/V Shunt Intervention;  Surgeon: Algernon Huxley, MD;  Location: [facility];  Service: Cardiovascular;  Laterality: N/A;  . TUBAL LIGATION  1973    Social History   Socioeconomic History  . Marital status: Married    Spouse name: Not on file  . Number of children: Not on file  . Years of education: Not on file  . Highest education level: Not on file  Occupational History  . Not on file  Social Needs  . Financial resource strain: Not on file  . Food insecurity:    Worry: Not on file    Inability: Not on file  . Transportation needs:    Medical: Not on file    Non-medical: Not on file  Tobacco Use  . Smoking status: Never Smoker  . Smokeless tobacco: Never Used  Substance and Sexual Activity  . Alcohol use: No  . Drug use: No  . Sexual activity: Not on file  Lifestyle  . Physical activity:    Days per week: Not on file    Minutes per session: Not on file  . Stress: Not on file  Relationships  . Social connections:    Talks on phone: Not on file    Gets together: Not on file    Attends religious service: Not on file    Active member of club or organization: Not on file    Attends meetings of clubs or organizations: Not on file    Relationship status: Not on file  . Intimate partner violence:    Fear of current or ex partner: Not on file    Emotionally abused: Not on file    Physically abused: Not on file    Forced sexual activity: Not on file  Other Topics Concern  . Not on file  Social History Narrative  . Not on file   Family History  Problem Relation Age of Onset  . Kidney failure Mother   . Diabetes Mother   . Aortic aneurysm Father   . Coronary artery disease Father   . Heart attack Father   . Diabetes Sister   . Diabetes Brother   . Kidney failure  Brother       VITAL SIGNS BP (!) 142/51   Pulse 66   Temp 98.2 F (36.8 C)   Resp 16   Ht 5\' 3"  (1.6 m)   Wt 220 lb 4.8 oz (99.9 kg)   LMP 02/01/1986 (Approximate)   SpO2 98%   BMI 39.02 kg/m   Outpatient Encounter Medications as of 08/28/2018  Medication Sig  . acetaminophen (TYLENOL) 325 MG tablet Take 2 tablets (650 mg total) by mouth every 6 (six) hours as needed for mild pain, fever or headache (or  Fever >/= 101).  [name] albuterol (PROVENTIL HFA;VENTOLIN HFA) 108 (90 BASE) MCG/ACT inhaler Inhale 2 puffs into the lungs every 6 (six) hours as needed for wheezing or shortness of breath.  [name] b complex-vitamin c-folic acid (NEPHRO-VITE) 0.8 MG TABS tablet Take 1 tablet by mouth daily.  . carvedilol (COREG) 25 MG tablet Take 25 mg by mouth 2 (two) times daily.   . Cholecalciferol (VITAMIN D3) 5000 UNITS TABS Take 5,000 Units by mouth daily.  . ferrous fumarate-iron polysaccharide complex (TANDEM) 162-115.2 MG CAPS capsule Take 1 capsule by mouth daily.  [name] FLUoxetine (PROZAC) 20 MG tablet Take 20 mg by mouth daily.   . furosemide (LASIX) 40 MG tablet Take 20-80 mg by mouth. Sunday, Tuesday and Saturday pt takes 80 mg in the morning. Thursdays 20 mg only. Tuesdays pt may take lower dose.  . heparin 5000 UNIT/ML injection Inject 1 mL (5,000 Units total) into the skin every 8 (eight) hours for 12 days.  . hydrALAZINE (APRESOLINE) 50 MG tablet Take 50 mg by mouth 2 (two) times daily.   [name] HYDROcodone-acetaminophen (NORCO/VICODIN) 5-325 MG tablet Take 1 tablet by mouth every 6 (six) hours as needed for severe pain.  . isosorbide mononitrate (IMDUR) 30 MG 24 hr tablet Take 30 mg by mouth at bedtime.   [name] levothyroxine (SYNTHROID, LEVOTHROID) 75 MCG tablet Take 75 mcg by mouth daily.   [name] lidocaine-prilocaine (EMLA) cream Apply 1 application topically as needed (for pain).  [name] loperamide (IMODIUM A-D) 2 MG tablet Take 2 mg by mouth 4 (four) times daily as needed for diarrhea or loose stools. Give 2  tablets (4 mg) with first loose stool, then 1 tablet (2 mg) with each subsequent loose stool up to 8 doses in 24 hours  . methocarbamol (ROBAXIN) 750 MG tablet Take 750 mg by mouth every 8 (eight) hours. muscle spasms and pain management  . midodrine (PROAMATINE) 5 MG tablet Take 1 tablet (5 mg total) by mouth 2 (two) times daily with a meal.  . NON FORMULARY Diet Type: renal, NAS, NCS  . Nutritional Supplements (PROMOD) LIQD Take 30 mLs by mouth 2 (two) times daily. protein calorie malnutrition albumin 2.6  . nystatin (MYCOSTATIN/NYSTOP) 100000 UNIT/GM POWD Apply 1 g topically as needed (for irritation).   . OXYGEN Inhale 2 L/min into the lungs continuous.  . potassium chloride SA (K-DUR,KLOR-CON) 20 MEQ tablet Take 20 mEq by mouth daily.  . rosuvastatin (CRESTOR) 40 MG tablet Take 40 mg by mouth every evening.   Orlie Dakin Sodium (SENNA PLUS) 8.6-50 MG CAPS Take 2 capsules by mouth 2 (two) times daily.  [name] telmisartan (MICARDIS) 80 MG tablet Take 80 mg by mouth daily. At night  . traMADol (ULTRAM) 50 MG tablet Take 1 tablet (50 mg total) by mouth every 6 (six) hours as needed for moderate pain or severe pain.  . traZODone (DESYREL) 100 MG tablet Take 100 mg by mouth at bedtime.   . triamcinolone (KENALOG) 0.5 % cream Apply 1 application topically as needed (for irritation).   . [DISCONTINUED] methocarbamol (ROBAXIN) 500 MG tablet Take 500 mg by mouth 3 (three) times daily.   No facility-administered encounter medications on file as of 08/28/2018.      SIGNIFICANT DIAGNOSTIC EXAMS   PREVIOUS  08-11-18: chest x-ray: Mild central venous congestion. Midline sternotomy.  08-11-18; right hip and pelvic x-ray: Acute intertrochanteric RIGHT femur fracture  NO NEW EXAMS.   LABS REVIEWED PREVIOUS:   12-26-17: chol 128; ldl 46; trig 115; hdl  59  07-03-18: tsh 3.48 08-11-18: wbc 10.2; hgb 9.8; hct 29.3; mcv 107.3; plt 90 glucose 147; ldl 16; creat 2.20; k+ 3.6; na++ 133; ca 8.1; liver  normal albumin 3.3 folate 31.0;  Vit B 12: 4.9 phos 2.7; mag 1.8 pre-albumin 18.8ca ionized 4.6  08-13-18: glucose 132; bun 37; creat 4.39; k+ 3.8; na++ 132; ca 8.3 PTH 59 08-15-18: wbc 8.7; hgb 7.1; hct 20.7'; mcv 106.7; plt 128 glucose 132; bun 37; creat 3.31; k+ 3.5; na++ 132; ca 8.4 albumin 2.6  08-20-18: wbc 12.1;hgb 7.6; hct 22.8; mcv 100.4; plt 151  NO NEW LABS.    Review of Systems  Constitutional: Negative for malaise/fatigue.  Respiratory: Negative for cough and shortness of breath.   Cardiovascular: Negative for chest pain, palpitations and leg swelling.  Gastrointestinal: Negative for abdominal pain, constipation and heartburn.  Musculoskeletal: Negative for back pain, joint pain and myalgias.  Skin: Negative.   Neurological: Negative for dizziness.  Psychiatric/Behavioral: The patient is not nervous/anxious.     Physical Exam Constitutional:      General: She is not in acute distress.    Appearance: She is well-developed. She is obese. She is not diaphoretic.  Neck:     Thyroid: No thyromegaly.  Cardiovascular:     Rate and Rhythm: Normal rate and regular rhythm.     Pulses: Normal pulses.     Heart sounds: Murmur present.     Comments: 2/6 Pulmonary:     Effort: Pulmonary effort is normal. No respiratory distress.     Breath sounds: Normal breath sounds.     Comments:  02 dependent Uses CPAP at night  Abdominal:     General: Bowel sounds are normal. There is no distension.     Palpations: Abdomen is soft.     Tenderness: There is no abdominal tenderness.  Musculoskeletal:     Right lower leg: Edema present.     Left lower leg: No edema.     Comments:  Right leg edema Is able to move all extremities  Status post right hip fracture.     Lymphadenopathy:     Cervical: No cervical adenopathy.  Skin:    General: Skin is warm and dry.     Comments: Right hip incision without signs of infection present does have swelling and a hematoma present  Left upper  extremity A/V fistula  + thrill/+bruit  Neurological:     Mental Status: She is alert. Mental status is at baseline.       ASSESSMENT/ PLAN:  TODAY;   1. Coronary artery disease involving native heart with angina pectoris: is status post cabg will continue coreg 25 mg twice daily imdur 30 mg daily   2. Diastolic heart failure; chronic: is stable will continue lasix 80 mg on Sunday Tuesday Saturday and 20 mg on Thursday; coreg 25 mg twice daily imdur 30 mg daily   3. Hypertensive heart and renal disease with congestive heart failure and end stage renal disease: is stable b/p 142/51: will continue coreg 25 mg twice daily micardis 80 mg daily apresoline 50 mg twice daily   4. Obstructive sleep apnea: is stable on CPAP nightly   PREVIOUS  5. COPD with asthma: is stable is on 02 2L continuous; will continue albuterol 2 puffs every 6 hours as needed;   6. Acquired hypothyroidism: is stable tsh 3.48: will continue synthroid 75 mcg daily  7. Dyslipidemia associated with diabetes type 2 mellitus: is stable LDL 46; will continue crestor 40 mg  daily   8. Anemia due to end stage renal failure: is stable hgb 7.6; is on EPO with dialysis will contoinue tandem 162-115.2 daily   9. Chronic kidney disease with end stage renal disease on dialysis due to type 2 diabetes mellitus: is stable is on hemodialysis three times daily will continue nephrovit daily 1200 cc fluid restriction is followed by nephropathy  10. Diabetes type 2 with renal manifestation: is stable hgb a1c 5.7   11. Chronic cerebrovascular accident : is neurologically stable will continue to monitor her status.   12. Chronic depression: is stable will continue prozac 20 mg daily and trazodone 100 mg nightly   13.  Chronic constipation: is stable will continue senna s  2 tabs twice daily  14. intertrochanteric RIGHT femur fracture: is stable will follow up with orthopedics as directed and will continue therapy; will continue heparin  5000 units every 8 hours for 12 days; will continue robaxin 750 mg three times daily ; has ultram 50 mg every 6 hours as needed and vicodin 5/325 mg every 6 hours as needed   15. Protein calorie malnutrition, severe: is without change albumin 2.6 will continue prostat 30 cc twice daily      MD is aware of resident's narcotic use and is in agreement with current plan of care. We will attempt to wean resident as apropriate   Ok Edwards NP [facility]  Contact [phone] Monday through Friday 8am- 5pm  After hours call [phone]

## 2018-08-31 ENCOUNTER — Non-Acute Institutional Stay (SKILLED_NURSING_FACILITY): Payer: Medicare Other | Admitting: Adult Health

## 2018-08-31 ENCOUNTER — Other Ambulatory Visit
Admission: RE | Admit: 2018-08-31 | Discharge: 2018-08-31 | Disposition: A | Discharge status: Home / Self Care | Payer: Medicare Other | Source: Ambulatory Visit | Attending: Adult Health | Admitting: Adult Health

## 2018-08-31 DIAGNOSIS — I132 Hypertensive heart and chronic kidney disease with heart failure and with stage 5 chronic kidney disease, or end stage renal disease: Secondary | ICD-10-CM | POA: Diagnosis present

## 2018-08-31 DIAGNOSIS — L03115 Cellulitis of right lower limb: Secondary | ICD-10-CM

## 2018-08-31 LAB — BASIC METABOLIC PANEL
Anion gap: 7 (ref 5–15)
BUN: 23 mg/dL (ref 8–23)
CO2: 27 mmol/L (ref 22–32)
Calcium: 8.1 mg/dL — ABNORMAL LOW (ref 8.9–10.3)
Chloride: 97 mmol/L — ABNORMAL LOW (ref 98–111)
Creatinine, Ser: 2.14 mg/dL — ABNORMAL HIGH (ref 0.44–1.00)
GFR calc Af Amer: 25 mL/min — ABNORMAL LOW (ref >60)
GFR, EST NON AFRICAN AMERICAN: 21 mL/min — AB (ref >60)
Glucose, Bld: 123 mg/dL — ABNORMAL HIGH (ref 70–99)
Potassium: 3.6 mmol/L (ref 3.5–5.1)
Sodium: 131 mmol/L — ABNORMAL LOW (ref 135–145)

## 2018-09-01 ENCOUNTER — Other Ambulatory Visit: Payer: Self-pay

## 2018-09-01 ENCOUNTER — Emergency Department: Payer: Medicare Other

## 2018-09-01 ENCOUNTER — Emergency Department
Admission: EM | Admit: 2018-09-01 | Discharge: 2018-09-01 | Disposition: A | Discharge status: Home / Self Care | Payer: Medicare Other | Attending: Emergency Medicine | Admitting: Emergency Medicine

## 2018-09-01 DIAGNOSIS — Z91040 Latex allergy status: Secondary | ICD-10-CM | POA: Diagnosis not present

## 2018-09-01 DIAGNOSIS — E1122 Type 2 diabetes mellitus with diabetic chronic kidney disease: Secondary | ICD-10-CM | POA: Insufficient documentation

## 2018-09-01 DIAGNOSIS — Z951 Presence of aortocoronary bypass graft: Secondary | ICD-10-CM | POA: Diagnosis not present

## 2018-09-01 DIAGNOSIS — Z992 Dependence on renal dialysis: Secondary | ICD-10-CM | POA: Diagnosis not present

## 2018-09-01 DIAGNOSIS — L03115 Cellulitis of right lower limb: Secondary | ICD-10-CM | POA: Insufficient documentation

## 2018-09-01 DIAGNOSIS — Z79899 Other long term (current) drug therapy: Secondary | ICD-10-CM | POA: Insufficient documentation

## 2018-09-01 DIAGNOSIS — Z8673 Personal history of transient ischemic attack (TIA), and cerebral infarction without residual deficits: Secondary | ICD-10-CM | POA: Insufficient documentation

## 2018-09-01 DIAGNOSIS — I252 Old myocardial infarction: Secondary | ICD-10-CM | POA: Diagnosis not present

## 2018-09-01 DIAGNOSIS — I5032 Chronic diastolic (congestive) heart failure: Secondary | ICD-10-CM | POA: Diagnosis not present

## 2018-09-01 DIAGNOSIS — E039 Hypothyroidism, unspecified: Secondary | ICD-10-CM | POA: Diagnosis not present

## 2018-09-01 DIAGNOSIS — J441 Chronic obstructive pulmonary disease with (acute) exacerbation: Secondary | ICD-10-CM | POA: Diagnosis not present

## 2018-09-01 DIAGNOSIS — I2510 Atherosclerotic heart disease of native coronary artery without angina pectoris: Secondary | ICD-10-CM | POA: Insufficient documentation

## 2018-09-01 DIAGNOSIS — R079 Chest pain, unspecified: Secondary | ICD-10-CM | POA: Diagnosis present

## 2018-09-01 DIAGNOSIS — N186 End stage renal disease: Secondary | ICD-10-CM | POA: Diagnosis not present

## 2018-09-01 DIAGNOSIS — I132 Hypertensive heart and chronic kidney disease with heart failure and with stage 5 chronic kidney disease, or end stage renal disease: Secondary | ICD-10-CM | POA: Insufficient documentation

## 2018-09-01 DIAGNOSIS — J45909 Unspecified asthma, uncomplicated: Secondary | ICD-10-CM | POA: Insufficient documentation

## 2018-09-01 LAB — COMPREHENSIVE METABOLIC PANEL
ALT: 20 U/L (ref 0–44)
AST: 32 U/L (ref 15–41)
Albumin: 3.2 g/dL — ABNORMAL LOW (ref 3.5–5.0)
Alkaline Phosphatase: 111 U/L (ref 38–126)
Anion gap: 7 (ref 5–15)
BILIRUBIN TOTAL: 1 mg/dL (ref 0.3–1.2)
BUN: 18 mg/dL (ref 8–23)
CO2: 28 mmol/L (ref 22–32)
Calcium: 8.5 mg/dL — ABNORMAL LOW (ref 8.9–10.3)
Chloride: 97 mmol/L — ABNORMAL LOW (ref 98–111)
Creatinine, Ser: 2.2 mg/dL — ABNORMAL HIGH (ref 0.44–1.00)
GFR calc Af Amer: 24 mL/min — ABNORMAL LOW (ref >60)
GFR, EST NON AFRICAN AMERICAN: 20 mL/min — AB (ref >60)
Glucose, Bld: 124 mg/dL — ABNORMAL HIGH (ref 70–99)
Potassium: 3.7 mmol/L (ref 3.5–5.1)
Sodium: 132 mmol/L — ABNORMAL LOW (ref 135–145)
Total Protein: 5.9 g/dL — ABNORMAL LOW (ref 6.5–8.1)

## 2018-09-01 LAB — CBC WITH DIFFERENTIAL/PLATELET
Abs Immature Granulocytes: 0.03 10*3/uL (ref 0.00–0.07)
Basophils Absolute: 0 10*3/uL (ref 0.0–0.1)
Basophils Relative: 0 %
Eosinophils Absolute: 0.2 10*3/uL (ref 0.0–0.5)
Eosinophils Relative: 3 %
HCT: 30.7 % — ABNORMAL LOW (ref 36.0–46.0)
Hemoglobin: 9.9 g/dL — ABNORMAL LOW (ref 12.0–15.0)
Immature Granulocytes: 0 %
Lymphocytes Relative: 20 %
Lymphs Abs: 1.4 10*3/uL (ref 0.7–4.0)
MCH: 32.7 pg (ref 26.0–34.0)
MCHC: 32.2 g/dL (ref 30.0–36.0)
MCV: 101.3 fL — ABNORMAL HIGH (ref 80.0–100.0)
Monocytes Absolute: 0.8 10*3/uL (ref 0.1–1.0)
Monocytes Relative: 11 %
Neutro Abs: 4.6 10*3/uL (ref 1.7–7.7)
Neutrophils Relative %: 66 %
Platelets: 160 10*3/uL (ref 150–400)
RBC: 3.03 MIL/uL — ABNORMAL LOW (ref 3.87–5.11)
RDW: 17.3 % — ABNORMAL HIGH (ref 11.5–15.5)
WBC: 7 10*3/uL (ref 4.0–10.5)
nRBC: 0 % (ref 0.0–0.2)

## 2018-09-01 LAB — TROPONIN I: Troponin I: <0.03 ng/mL (ref <0.03)

## 2018-09-01 MED ORDER — ALBUTEROL SULFATE HFA 108 (90 BASE) MCG/ACT IN AERS: 2.0000 | INHALATION_SPRAY | RESPIRATORY_TRACT | 0 refills | Status: DC | PRN

## 2018-09-01 NOTE — Discharge Instructions (Addendum)
Please use your inhaler, you can use it up to every four hours, as needed for breathing difficulty or chest tightness. Please seek medical attention for any high fevers, chest pain, shortness of breath, change in behavior, persistent vomiting, bloody stool or any other new or concerning symptoms.

## 2018-09-01 NOTE — Progress Notes (Signed)
Location:   [facility] Room Number: 2 Place of Service:  SNF (31)   CODE STATUS: full   Allergies  Allergen Reactions  . Ace Inhibitors Other (See Comments)    Reaction:  Unknown   . Amlodipine Other (See Comments)    Reaction:  Unknown   . Clonidine Hydrochloride Other (See Comments)    Reaction:  Unknown   . Codeine Hives  . Latex Hives  . Metformin Nausea And Vomiting  . Tape Rash    Chief Complaint  Patient presents with  . Acute Visit    incision line redness     HPI:  She is status post right hip ORIF. Her incision line is red; inflamed with purulent bloody drainage present. There are no reports of fevers present. She continues with therapy and continues with hemodialysis three days per week  Past Medical History:  Diagnosis Date  . ACE-inhibitor cough   . Aortic valve disorder    Echo in 2009 showed mean aortic valve gradient of 13 mmHg, suggesting very mild stenosis. Echo (12/10) suggested aortic sclerosis only  . Carotid artery disease (HCC)    mild, carotid dopplers 12/2009  . CHF (congestive heart failure) ([place])   . COPD (chronic obstructive pulmonary disease) ([place])   . Coronary artery disease    s/p anterior MI in 1996 followed by CABG. Lexiscan myoview (4/11): EF 67%, normal perfusion with no evidence for ischemia or infarction.   . Diabetes mellitus    type 2  . Diastolic heart failure    most recent echo (12/10) showed EF 55-60% with mild LVH, grade I diastolic dysfunction, mild LAE.  [name] ESRD (end stage renal disease) on dialysis ([place])   . Hyperlipidemia   . Hypertension    resistant hyptertension times many years. the patient does have renal artery stenosis. she has tried calcium channel blockers in the past and states that she would not take them now because she had some problems with her gums which her dentist identified as calcium-channel blocker side effects.  . Hypothyroidism   . Mild asthma    PFT 02/05/10 FEV1 1.38, FEV1% 73, TLC  3.78 (86%), DLCO 48%, +BD  . Myocardial infarction ([place])   . Obesity   . Obesity hypoventilation syndrome ([place])       . Obstructive sleep apnea    PSG 01/05/2006 AHI 24.8, CPAP 9cm H2O  . Pulmonary nodule, right   . Renal artery stenosis [facility])    The patient has an occluded right renal artery and an atrophic right kidney. There is 20% left renal artery stenosis. This was seen by catheterization in 2007  . Stroke [facility])     Past Surgical History:  Procedure Laterality Date  . A/V FISTULAGRAM N/A 01/25/2018   Procedure: A/V FISTULAGRAM;  Surgeon: Algernon Huxley, MD;  Location: [facility] LAB;  Service: Cardiovascular;  Laterality: N/A;  . ABDOMINAL HYSTERECTOMY  1985  . AV FISTULA PLACEMENT    . CATARACT EXTRACTION  2010  . CORONARY ARTERY BYPASS GRAFT  1996  . DIALYSIS FISTULA CREATION    . [place]   right  . INTRAMEDULLARY (IM) NAIL INTERTROCHANTERIC Right 08/17/2018   Procedure: INTRAMEDULLARY (IM) NAIL INTERTROCHANTRIC;  Surgeon: Earnestine Leys, MD;  Location: [facility] ORS;  Service: Orthopedics;  Laterality: Right;  . PERIPHERAL VASCULAR CATHETERIZATION Left 01/06/2015   Procedure: A/V Shuntogram/Fistulagram;  Surgeon: Katha Cabal, MD;  Location: [place] [facility];  Service: Cardiovascular;  Laterality: Left;  . PERIPHERAL VASCULAR CATHETERIZATION N/A 02/17/2015   Procedure: Dialysis/Perma Catheter Removal;  Surgeon: Katha Cabal, MD;  Location: [facility];  Service: Cardiovascular;  Laterality: N/A;  . PERIPHERAL VASCULAR CATHETERIZATION N/A 01/28/2016   Procedure: A/V Shuntogram/Fistulagram;  Surgeon: Algernon Huxley, MD;  Location: [facility];  Service: Cardiovascular;  Laterality: N/A;  . PERIPHERAL VASCULAR CATHETERIZATION N/A 01/28/2016   Procedure: A/V Shunt Intervention;  Surgeon: Algernon Huxley, MD;  Location: [place] [facility] LAB;  Service: Cardiovascular;  Laterality: N/A;  . TUBAL LIGATION  1973    Social History   Socioeconomic  History  . Marital status: Married    Spouse name: Not on file  . Number of children: Not on file  . Years of education: Not on file  . Highest education level: Not on file  Occupational History  . Not on file  Social Needs  . Financial resource strain: Not on file  . Food insecurity:    Worry: Not on file    Inability: Not on file  . Transportation needs:    Medical: Not on file    Non-medical: Not on file  Tobacco Use  . Smoking status: Never Smoker  . Smokeless tobacco: Never Used  Substance and Sexual Activity  . Alcohol use: No  . Drug use: No  . Sexual activity: Not on file  Lifestyle  . Physical activity:    Days per week: Not on file    Minutes per session: Not on file  . Stress: Not on file  Relationships  . Social connections:    Talks on phone: Not on file    Gets together: Not on file    Attends religious service: Not on file    Active member of club or organization: Not on file    Attends meetings of clubs or organizations: Not on file    Relationship status: Not on file  . Intimate partner violence:    Fear of current or ex partner: Not on file    Emotionally abused: Not on file    Physically abused: Not on file    Forced sexual activity: Not on file  Other Topics Concern  . Not on file  Social History Narrative  . Not on file   Family History  Problem Relation Age of Onset  . Kidney failure Mother   . Diabetes Mother   . Aortic aneurysm Father   . Coronary artery disease Father   . Heart attack Father   . Diabetes Sister   . Diabetes Brother   . Kidney failure Brother       VITAL SIGNS BP (!) 152/44   Pulse 78   Temp 98.1 F (36.7 C)   Resp 16   Ht 5\' 3"  (1.6 m)   Wt 221 lb 8 oz (100.5 kg)   LMP 02/01/1986 (Approximate)   SpO2 96%   BMI 39.24 kg/m   Outpatient Encounter Medications as of 08/31/2018  Medication Sig  . acetaminophen (TYLENOL) 325 MG tablet Take 2 tablets (650 mg total) by mouth every 6 (six) hours as needed for  mild pain, fever or headache (or Fever >/= 101).  [name] albuterol (PROVENTIL HFA;VENTOLIN HFA) 108 (90 BASE) MCG/ACT inhaler Inhale 2 puffs into the lungs every 6 (six) hours as needed for wheezing or shortness of breath.  [name] b complex-vitamin c-folic acid (NEPHRO-VITE) 0.8 MG TABS tablet Take 1 tablet by mouth daily.  . carvedilol (COREG) 25 MG tablet  Take 25 mg by mouth 2 (two) times daily.   . Cholecalciferol (VITAMIN D3) 5000 UNITS TABS Take 5,000 Units by mouth daily.  . ferrous fumarate-iron polysaccharide complex (TANDEM) 162-115.2 MG CAPS capsule Take 1 capsule by mouth daily.  [name] FLUoxetine (PROZAC) 20 MG tablet Take 20 mg by mouth daily.   . furosemide (LASIX) 40 MG tablet Take 20-80 mg by mouth. Sunday, Tuesday and Saturday pt takes 80 mg in the morning. Thursdays 20 mg only. Tuesdays pt may take lower dose.  . heparin 5000 UNIT/ML injection Inject 1 mL (5,000 Units total) into the skin every 8 (eight) hours for 12 days.  . hydrALAZINE (APRESOLINE) 50 MG tablet Take 50 mg by mouth 2 (two) times daily.   [name] HYDROcodone-acetaminophen (NORCO/VICODIN) 5-325 MG tablet Take 1 tablet by mouth every 6 (six) hours as needed for severe pain.  . isosorbide mononitrate (IMDUR) 30 MG 24 hr tablet Take 30 mg by mouth at bedtime.   [name] levothyroxine (SYNTHROID, LEVOTHROID) 75 MCG tablet Take 75 mcg by mouth daily.   [name] lidocaine-prilocaine (EMLA) cream Apply 1 application topically as needed (for pain).  . methocarbamol (ROBAXIN) 750 MG tablet Take 750 mg by mouth every 8 (eight) hours. muscle spasms and pain management  . midodrine (PROAMATINE) 5 MG tablet Take 1 tablet (5 mg total) by mouth 2 (two) times daily with a meal.  . NON FORMULARY Diet Type: renal, NAS, NCS  . Nutritional Supplements (PROMOD) LIQD Take 30 mLs by mouth 2 (two) times daily. protein calorie malnutrition albumin 2.6  . nystatin (MYCOSTATIN/NYSTOP) 100000 UNIT/GM POWD Apply 1 g topically as needed (for irritation).   . OXYGEN Inhale 2  L/min into the lungs continuous.  . potassium chloride SA (K-DUR,KLOR-CON) 20 MEQ tablet Take 20 mEq by mouth daily.  . rosuvastatin (CRESTOR) 40 MG tablet Take 40 mg by mouth every evening.   Orlie Dakin Sodium (SENNA PLUS) 8.6-50 MG CAPS Take 2 capsules by mouth 2 (two) times daily.  [name] telmisartan (MICARDIS) 80 MG tablet Take 80 mg by mouth daily. At night  . traMADol (ULTRAM) 50 MG tablet Take 1 tablet (50 mg total) by mouth every 6 (six) hours as needed for moderate pain or severe pain.  . traZODone (DESYREL) 100 MG tablet Take 100 mg by mouth at bedtime.   . triamcinolone (KENALOG) 0.5 % cream Apply 1 application topically as needed (for irritation).    No facility-administered encounter medications on file as of 08/31/2018.      SIGNIFICANT DIAGNOSTIC EXAMS   PREVIOUS  08-11-18: chest x-ray: Mild central venous congestion. Midline sternotomy.  08-11-18; right hip and pelvic x-ray: Acute intertrochanteric RIGHT femur fracture  NO NEW EXAMS.   LABS REVIEWED PREVIOUS:   12-26-17: chol 128; ldl 46; trig 115; hdl 59  07-03-18: tsh 3.48 08-11-18: wbc 10.2; hgb 9.8; hct 29.3; mcv 107.3; plt 90 glucose 147; ldl 16; creat 2.20; k+ 3.6; na++ 133; ca 8.1; liver normal albumin 3.3 folate 31.0;  Vit B 12: 4.9 phos 2.7; mag 1.8 pre-albumin 18.8ca ionized 4.6  08-13-18: glucose 132; bun 37; creat 4.39; k+ 3.8; na++ 132; ca 8.3 PTH 59 08-15-18: wbc 8.7; hgb 7.1; hct 20.7'; mcv 106.7; plt 128 glucose 132; bun 37; creat 3.31; k+ 3.5; na++ 132; ca 8.4 albumin 2.6  08-20-18: wbc 12.1;hgb 7.6; hct 22.8; mcv 100.4; plt 151  NO NEW LABS.   Review of Systems  Constitutional: Negative for malaise/fatigue.  Respiratory: Negative for cough and shortness of breath.  Cardiovascular: Negative for chest pain, palpitations and leg swelling.  Gastrointestinal: Negative for abdominal pain, constipation and heartburn.  Musculoskeletal: Negative for back pain, joint pain and myalgias.  Skin:  Negative.   Neurological: Negative for dizziness.  Psychiatric/Behavioral: The patient is not nervous/anxious.     Physical Exam Constitutional:      General: She is not in acute distress.    Appearance: She is well-developed. She is obese. She is not diaphoretic.  Neck:     Musculoskeletal: Neck supple.     Thyroid: No thyromegaly.  Cardiovascular:     Rate and Rhythm: Normal rate and regular rhythm.     Pulses: Normal pulses.     Heart sounds: Murmur present.  Pulmonary:     Effort: Pulmonary effort is normal. No respiratory distress.     Breath sounds: Normal breath sounds.     Comments: 02 dependent Uses CPAP at night  Abdominal:     General: Bowel sounds are normal. There is no distension.     Palpations: Abdomen is soft.     Tenderness: There is no abdominal tenderness.  Musculoskeletal:     Right lower leg: Edema present.     Left lower leg: No edema.     Comments: Right leg edema Is able to move all extremities  Status post right hip fracture.    Lymphadenopathy:     Cervical: No cervical adenopathy.  Skin:    General: Skin is warm and dry.     Comments: Right hip incision line with warmth redness and purulent bloody drainage present.  Left upper extremity A/V fistula  + thrill/+bruit   Neurological:     Mental Status: She is alert. Mental status is at baseline.  Psychiatric:        Mood and Affect: Mood normal.     ASSESSMENT/ PLAN:  TODAY:   1. Right hip cellulitis: is worse: will begin keflex 250 mg three times daily through 09-10-18 with probiotic twice daily and will monitor her status.   MD is aware of resident's narcotic use and is in agreement with current plan of care. We will attempt to wean resident as apropriate   Ok Edwards NP [facility]  Contact [phone] Monday through Friday 8am- 5pm  After hours call [phone]

## 2018-09-01 NOTE — ED Notes (Signed)
Family at bedside and updated on delay for MD. Blood pressure cuff readjusted per family request.

## 2018-09-01 NOTE — ED Notes (Signed)
Report to [employer], lpn and [facility].

## 2018-09-01 NOTE — ED Triage Notes (Signed)
Pt from [facility] with complaint of shob this pm. Per ems on their arrival pt with wheezing that resolved with one 5mg  albuterol neb. Pt with diminished breath sounds currently, but no wheezing auscultated. Pt recently broke right hip. Pt denies other complaints.

## 2018-09-01 NOTE — ED Provider Notes (Signed)
[facility] Emergency Department Provider Note   ____________________________________________   I have reviewed the triage vital signs and the nursing notes.   HISTORY  Chief Complaint Chest tightness  History limited by: Not Limited   HPI Dawn Bradford is a 82 y.o. female who presents to the emergency department today with main complaint of chest tightness.  Is located in the center part of her chest.  It has been going on and off.  Patient has a history of end-stage renal disease and is on dialysis.  The patient states that her dialysis schedule has been somewhat affected by Christmas.  She states that she does feel better after DuoNeb treatment given by EMS.  She is scheduled for dialysis Monday.  There was some concern from living facility that she had too much fluid on her.  Patient denies any recent fevers.   Per medical record review patient has a history of ESRD, hld, htn.   Past Medical History:  Diagnosis Date  . ACE-inhibitor cough   . Aortic valve disorder    Echo in 2009 showed mean aortic valve gradient of 13 mmHg, suggesting very mild stenosis. Echo (12/10) suggested aortic sclerosis only  . Carotid artery disease (HCC)    mild, carotid dopplers 12/2009  . CHF (congestive heart failure) ([place])   . COPD (chronic obstructive pulmonary disease) ([place])   . Coronary artery disease    s/p anterior MI in 1996 followed by CABG. Lexiscan myoview (4/11): EF 67%, normal perfusion with no evidence for ischemia or infarction.   . Diabetes mellitus    type 2  . Diastolic heart failure    most recent echo (12/10) showed EF 55-60% with mild LVH, grade I diastolic dysfunction, mild LAE.  [name] ESRD (end stage renal disease) on dialysis ([place])   . Hyperlipidemia   . Hypertension    resistant hyptertension times many years. the patient does have renal artery stenosis. she has tried calcium channel blockers in the past and states that she would not take them now  because she had some problems with her gums which her dentist identified as calcium-channel blocker side effects.  . Hypothyroidism   . Mild asthma    PFT 02/05/10 FEV1 1.38, FEV1% 73, TLC 3.78 (86%), DLCO 48%, +BD  . Myocardial infarction ([place])   . Obesity   . Obesity hypoventilation syndrome ([place])       . Obstructive sleep apnea    PSG 01/05/2006 AHI 24.8, CPAP 9cm H2O  . Pulmonary nodule, right   . Renal artery stenosis [facility])    The patient has an occluded right renal artery and an atrophic right kidney. There is 20% left renal artery stenosis. This was seen by catheterization in 2007  . Stroke [facility])     Patient Active Problem List   Diagnosis Date Noted  . Hypertensive heart and renal disease with congestive heart failure and end stage renal disease ([place]) 08/23/2018  . COPD with asthma ([place]) 08/23/2018  . Acquired hypothyroidism 08/23/2018  . Dyslipidemia associated with type 2 diabetes mellitus ([place]) 08/23/2018  . Chronic kidney disease with end stage renal disease on dialysis due to type 2 diabetes mellitus ([place]) 08/23/2018  . Diabetes mellitus type 2 with retinopathy ([place]) 08/23/2018  . Controlled type 2 diabetes with renal manifestation ([place]) 08/23/2018  . Chronic cerebrovascular accident (CVA) 08/23/2018  . Chronic constipation 08/23/2018  . Protein-calorie malnutrition, severe ([place]) 08/23/2018  . Closed comminuted intertrochanteric fracture of proximal end of right femur (  [place]) 08/23/2018  . Closed intertrochanteric fracture of hip, right, initial encounter ([place]) 08/11/2018  . Hypoxia 10/12/2017  . ESRD on dialysis ([place]) 07/07/2016  . Chest pain 08/19/2015  . Coronary artery disease involving native heart with angina pectoris ([place]) 08/18/2015  . Expressive aphasia 02/03/2015  . Carotid stenosis 02/03/2015  . Diabetes mellitus type 2 in obese ([place]) 01/31/2015  . End stage renal disease ([place]) 01/31/2015  . Mild intermittent asthma 03/29/2010  . CAD, ARTERY BYPASS GRAFT  03/02/2010  . Obesity hypoventilation syndrome ([place]) 02/05/2010  . Chronic depression 01/15/2010  . Shortness of breath 12/24/2009  . CAROTID BRUIT 12/14/2009  . DIASTOLIC HEART FAILURE, CHRONIC 02/17/2009  . DIABETES MELLITUS, TYPE II 01/29/2009  . Hyperlipidemia 01/29/2009  . Obstructive sleep apnea 01/29/2009  . Essential hypertension 01/29/2009  . Aortic valve disorder 01/29/2009  . RENAL ARTERY STENOSIS 01/29/2009  . RENAL DISEASE, CHRONIC 01/29/2009    Past Surgical History:  Procedure Laterality Date  . A/V FISTULAGRAM N/A 01/25/2018   Procedure: A/V FISTULAGRAM;  Surgeon: Algernon Huxley, MD;  Location: [place] [facility];  Service: Cardiovascular;  Laterality: N/A;  . ABDOMINAL HYSTERECTOMY  1985  . AV FISTULA PLACEMENT    . CATARACT EXTRACTION  2010  . CORONARY ARTERY BYPASS GRAFT  1996  . DIALYSIS FISTULA CREATION    . [place]   right  . INTRAMEDULLARY (IM) NAIL INTERTROCHANTERIC Right 08/17/2018   Procedure: INTRAMEDULLARY (IM) NAIL INTERTROCHANTRIC;  Surgeon: Earnestine Leys, MD;  Location: [facility] OR[facility];  Service: Orthopedics;  Laterality: Right;  . PERIPHERAL VASCULAR CATHETERIZATION Left 01/06/2015   Procedure: A/V Shuntogram/Fistulagram;  Surgeon: Katha Cabal, MD;  Location: [place] [facility];  Service: Cardiovascular;  Laterality: Left;  . PERIPHERAL VASCULAR CATHETERIZATION N/A 02/17/2015   Procedure: Dialysis/Perma Catheter Removal;  Surgeon: Katha Cabal, MD;  Location: [place] [facility];  Service: Cardiovascular;  Laterality: N/A;  . PERIPHERAL VASCULAR CATHETERIZATION N/A 01/28/2016   Procedure: A/V Shuntogram/Fistulagram;  Surgeon: Algernon Huxley, MD;  Location: [place] [facility];  Service: Cardiovascular;  Laterality: N/A;  . PERIPHERAL VASCULAR CATHETERIZATION N/A 01/28/2016   Procedure: A/V Shunt Intervention;  Surgeon: Algernon Huxley, MD;  Location: [place] [facility];  Service: Cardiovascular;  Laterality: N/A;  . TUBAL LIGATION   1973    Prior to Admission medications   Medication Sig Start Date End Date Taking? Authorizing Provider  acetaminophen (TYLENOL) 325 MG tablet Take 2 tablets (650 mg total) by mouth every 6 (six) hours as needed for mild pain, fever or headache (or Fever >/= 101). 08/21/18   Glad[place] Lighter, MD  albuterol (PROVENTIL HFA;VENTOLIN HFA) 108 (90 BASE) MCG/ACT inhaler Inhale 2 puffs into the lungs every 6 (six) hours as needed for wheezing or shortness of breath.    [provider]  b complex-vitamin c-folic acid (NEPHRO-VITE) 0.8 MG TABS tablet Take 1 tablet by mouth daily.    [provider]  carvedilol (COREG) 25 MG tablet Take 25 mg by mouth 2 (two) times daily.     [provider]  Cholecalciferol (VITAMIN D3) 5000 UNITS TABS Take 5,000 Units by mouth daily.    [provider]  ferrous fumarate-iron polysaccharide complex (TANDEM) 162-115.2 MG CAPS capsule Take 1 capsule by mouth daily.    [provider]  FLUoxetine (PROZAC) 20 MG tablet Take 20 mg by mouth daily.  08/25/17   [provider]  furosemide (LASIX) 40 MG tablet Take 20-80 mg by mouth.  Sunday, Tuesday and Saturday pt takes 80 mg in the morning. Thursdays 20 mg only. Tuesdays pt may take lower dose. 07/11/17   [provider]  heparin 5000 UNIT/ML injection Inject 1 mL (5,000 Units total) into the skin every 8 (eight) hours for 12 days. 08/21/18 09/02/18  Gladstone Lighter, MD  hydrALAZINE (APRESOLINE) 50 MG tablet Take 50 mg by mouth 2 (two) times daily.  07/10/17   [provider]  HYDROcodone-acetaminophen (NORCO/VICODIN) 5-325 MG tablet Take 1 tablet by mouth every 6 (six) hours as needed for severe pain. 08/22/18   Gerlene Fee, NP  isosorbide mononitrate (IMDUR) 30 MG 24 hr tablet Take 30 mg by mouth at bedtime.  07/10/17   [provider]  levothyroxine (SYNTHROID, LEVOTHROID) 75 MCG tablet Take 75 mcg by mouth daily.     [provider]  lidocaine-prilocaine (EMLA) cream Apply 1 application topically as needed (for pain).    [provider]  methocarbamol (ROBAXIN) 750 MG tablet Take 750 mg by mouth every 8 (eight) hours. muscle spasms and pain management    [provider]  midodrine (PROAMATINE) 5 MG tablet Take 1 tablet (5 mg total) by mouth 2 (two) times daily with a meal. 08/21/18   [place] Lighter, MD  NON FORMULARY Diet Type: renal, NAS, NCS    [provider]  Nutritional Supplements (PROMOD) LIQD Take 30 mLs by mouth 2 (two) times daily. protein calorie malnutrition albumin 2.6    [provider]  nystatin (MYCOSTATIN/NYSTOP) 100000 UNIT/GM POWD Apply 1 g topically as needed (for irritation).     [provider]  OXYGEN Inhale 2 L/min into the lungs continuous. 08/21/18   [provider]  potassium chloride SA (K-DUR,KLOR-CON) 20 MEQ tablet Take 20 mEq by mouth daily.    [provider]  rosuvastatin (CRESTOR) 40 MG tablet Take 40 mg by mouth every evening.  07/11/17   [provider]  Sennosides-Docusate Sodium (SENNA PLUS) 8.6-50 MG CAPS Take 2 capsules by mouth 2 (two) times daily.    [provider]  telmisartan (MICARDIS) 80 MG tablet Take 80 mg by mouth daily. At night    [provider]  traMADol (ULTRAM) 50 MG tablet Take 1 tablet (50 mg total) by mouth every 6 (six) hours as needed for moderate pain or severe pain. 08/22/18   Gerlene Fee, NP  traZODone (DESYREL) 100 MG tablet Take 100 mg by mouth at bedtime.     [provider]  triamcinolone (KENALOG) 0.5 % cream Apply 1 application topically as needed (for irritation).     [provider]    Allergies Ace inhibitors; Amlodipine; Clonidine hydrochloride; Codeine; Latex; Metformin; and Tape  Family History  Problem Relation Age of Onset  . Kidney failure Mother   . Diabetes Mother   . Aortic aneurysm Father   . Coronary artery disease  Father   . Heart attack Father   . Diabetes Sister   . Diabetes Brother   . Kidney failure Brother     Social History Social History   Tobacco Use  . Smoking status: Never Smoker  . Smokeless tobacco: Never Used  Substance Use Topics  . Alcohol use: No  . Drug use: No    Review of Systems Constitutional: No fever/chills Eyes: No visual changes. ENT: No sore throat. Cardiovascular: Positive for chest tightness.  Respiratory: Positive for shortness of breath. Gastrointestinal: No abdominal pain.  No nausea, no vomiting.  No diarrhea.  Genitourinary: Negative for dysuria. Musculoskeletal: Negative for back pain. Skin: Negative for rash. Neurological: Negative for headaches, focal weakness or numbness.  ____________________________________________   PHYSICAL EXAM:  VITAL SIGNS: ED Triage Vitals [09/01/18 1929]  Enc Vitals Group     BP (!) 165/65     Pulse Rate 91     Resp 20     Temp 98.1 F (36.7 C)     Temp Source Oral     SpO2 92 %     Weight 220 lb (99.8 kg)     Height 5\' 3"  (1.6 m)     Head Circumference      Peak Flow      Pain Score 0   Constitutional: Alert and oriented.  Eyes: Conjunctivae are normal.  ENT      Head: Normocephalic and atraumatic.      Nose: No congestion/rhinnorhea.      Mouth/Throat: Mucous membranes are moist.      Neck: No stridor. Hematological/Lymphatic/Immunilogical: No cervical lymphadenopathy. Cardiovascular: Normal rate, regular rhythm.  No murmurs, rubs, or gallops.  Respiratory: Normal respiratory effort without tachypnea nor retractions. Diffuse expiratory wheezing.  Gastrointestinal: Soft and non tender. No rebound. No guarding.  Genitourinary: Deferred Musculoskeletal: Normal range of motion in all extremities. No lower extremity edema. Neurologic:  Normal speech and language. No gross focal neurologic deficits are appreciated.  Skin:  Skin is warm, dry and intact. No rash noted. Psychiatric: Mood and affect are  normal. Speech and behavior are normal. Patient exhibits appropriate insight and judgment.  ____________________________________________    LABS (pertinent positives/negatives)  CBC wbc 7.0, hgb 9.9, plt 160 CMP na 132, k 3.7, glu 124, cr 2.20 Trop <0.03  ____________________________________________   EKG  I, Nance Pear, attending physician, personally viewed and interpreted this EKG  EKG Time: 1920 Rate: 94 Rhythm: sinus rhythm Axis: normal Intervals: qtc 461 QRS: narrow, q waves v1, v2 ST changes: no st elevation Impression: abnormal ekg  ____________________________________________    RADIOLOGY  CXR No pulmonary edema ____________________________________________   PROCEDURES  Procedures  ____________________________________________   INITIAL IMPRESSION / ASSESSMENT AND PLAN / ED COURSE  Pertinent labs & imaging results that were available during my care of the patient were reviewed by me and considered in my medical decision making (see chart for details).   Presented to the emergency department today because of concerns for some chest tightness and shortness of breath.  The chest tightness had resolved by the time my exam.  There was some concern from living facility that she was fluid overloaded given disruption of normal dialysis schedule secondary to the holiday.  Chest x-ray without any pulmonary edema.  On exam patient without any crackles.  She does have some wheezing.  I did offer to give another breathing treatment however she felt comfortable going home.  This point I think COPD exacerbation likely.  No evidence for pneumonia.  ____________________________________________   FINAL CLINICAL IMPRESSION(S) / ED DIAGNOSES  Final diagnoses:  COPD exacerbation ([place])     Note: This dictation was prepared with Dragon dictation. Any transcriptional errors that result from this process are unintentional     Nance Pear, MD 09/01/18 2053

## 2018-09-03 ENCOUNTER — Encounter: Payer: Self-pay | Admitting: Adult Health

## 2018-09-03 NOTE — Progress Notes (Signed)
Entered in error

## 2018-09-04 ENCOUNTER — Non-Acute Institutional Stay (SKILLED_NURSING_FACILITY): Payer: Medicare Other | Admitting: Adult Health

## 2018-09-04 ENCOUNTER — Encounter: Payer: Self-pay | Admitting: Adult Health

## 2018-09-04 DIAGNOSIS — E1169 Type 2 diabetes mellitus with other specified complication: Secondary | ICD-10-CM

## 2018-09-04 DIAGNOSIS — D631 Anemia in chronic kidney disease: Secondary | ICD-10-CM

## 2018-09-04 DIAGNOSIS — N186 End stage renal disease: Secondary | ICD-10-CM

## 2018-09-04 DIAGNOSIS — E039 Hypothyroidism, unspecified: Secondary | ICD-10-CM

## 2018-09-04 DIAGNOSIS — E785 Hyperlipidemia, unspecified: Secondary | ICD-10-CM

## 2018-09-04 DIAGNOSIS — J449 Chronic obstructive pulmonary disease, unspecified: Secondary | ICD-10-CM

## 2018-09-04 NOTE — Progress Notes (Signed)
Location:   [facility] Room Number: [place] of Service:  SNF (31)   CODE STATUS: Full Code  Allergies  Allergen Reactions  . Ace Inhibitors Other (See Comments)    Reaction:  Unknown   . Amlodipine Other (See Comments)    Reaction:  Unknown   . Clonidine Hydrochloride Other (See Comments)    Reaction:  Unknown   . Codeine Hives  . Latex Hives  . Metformin Nausea And Vomiting  . Tape Rash    Chief Complaint  Patient presents with  . Medical Management of Chronic Issues    Copd with asthma; acquired hypothyroidism; dyslipidemia associated with type 2 diabetes mellitus; anemia due to end stage renal disease. Weekly follow up for the first 30 days post hospitalization. Follow up ED.     HPI:  She is a 82 year old short term rehab patient being seen for the management of her chronic illnesses: copd with asthma; hypothyroidism; dyslipidemia; anemia. She did go to the ED over the weekend for an exacerbation of her COPD she continues to have shortness of breath; cough and wheezing present. There are no reports of fevers present. She had been hospitalized for a right hip fracture.   Past Medical History:  Diagnosis Date  . ACE-inhibitor cough   . Aortic valve disorder    Echo in 2009 showed mean aortic valve gradient of 13 mmHg, suggesting very mild stenosis. Echo (12/10) suggested aortic sclerosis only  . Carotid artery disease (HCC)    mild, carotid dopplers 12/2009  . CHF (congestive heart failure) ([place])   . COPD (chronic obstructive pulmonary disease) ([place])   . Coronary artery disease    s/p anterior MI in 1996 followed by CABG. Lexiscan myoview (4/11): EF 67%, normal perfusion with no evidence for ischemia or infarction.   . Diabetes mellitus    type 2  . Diastolic heart failure    most recent echo (12/10) showed EF 55-60% with mild LVH, grade I diastolic dysfunction, mild LAE.  [name] ESRD (end stage renal disease) on dialysis ([place])   .  Hyperlipidemia   . Hypertension    resistant hyptertension times many years. the patient does have renal artery stenosis. she has tried calcium channel blockers in the past and states that she would not take them now because she had some problems with her gums which her dentist identified as calcium-channel blocker side effects.  . Hypothyroidism   . Mild asthma    PFT 02/05/10 FEV1 1.38, FEV1% 73, TLC 3.78 (86%), DLCO 48%, +BD  . Myocardial infarction ([place])   . Obesity   . Obesity hypoventilation syndrome ([place])       . Obstructive sleep apnea    PSG 01/05/2006 AHI 24.8, CPAP 9cm H2O  . Pulmonary nodule, right   . Renal artery stenosis [facility])    The patient has an occluded right renal artery and an atrophic right kidney. There is 20% left renal artery stenosis. This was seen by catheterization in 2007  . Stroke [facility])     Past Surgical History:  Procedure Laterality Date  . A/V FISTULAGRAM N/A 01/25/2018   Procedure: A/V FISTULAGRAM;  Surgeon: Algernon Huxley, MD;  Location: [place] [facility];  Service: Cardiovascular;  Laterality: N/A;  . ABDOMINAL HYSTERECTOMY  1985  . AV FISTULA PLACEMENT    . CATARACT EXTRACTION  2010  . CORONARY ARTERY BYPASS GRAFT  1996  . DIALYSIS FISTULA CREATION    . INNER  [place]   right  . INTRAMEDULLARY (IM) NAIL INTERTROCHANTERIC Right 08/17/2018   Procedure: INTRAMEDULLARY (IM) NAIL INTERTROCHANTRIC;  Surgeon: Earnestine Leys, MD;  Location: [facility] ORS;  Service: Orthopedics;  Laterality: Right;  . PERIPHERAL VASCULAR CATHETERIZATION Left 01/06/2015   Procedure: A/V Shuntogram/Fistulagram;  Surgeon: Katha Cabal, MD;  Location: [place] [facility];  Service: Cardiovascular;  Laterality: Left;  . PERIPHERAL VASCULAR CATHETERIZATION N/A 02/17/2015   Procedure: Dialysis/Perma Catheter Removal;  Surgeon: Katha Cabal, MD;  Location: [place] [facility];  Service: Cardiovascular;  Laterality: N/A;  . PERIPHERAL VASCULAR CATHETERIZATION N/A  01/28/2016   Procedure: A/V Shuntogram/Fistulagram;  Surgeon: Algernon Huxley, MD;  Location: [place] [facility];  Service: Cardiovascular;  Laterality: N/A;  . PERIPHERAL VASCULAR CATHETERIZATION N/A 01/28/2016   Procedure: A/V Shunt Intervention;  Surgeon: Algernon Huxley, MD;  Location: [place] [facility];  Service: Cardiovascular;  Laterality: N/A;  . TUBAL LIGATION  1973    Social History   Socioeconomic History  . Marital status: Married    Spouse name: Not on file  . Number of children: Not on file  . Years of education: Not on file  . Highest education level: Not on file  Occupational History  . Not on file  Social Needs  . Financial resource strain: Not on file  . Food insecurity:    Worry: Not on file    Inability: Not on file  . Transportation needs:    Medical: Not on file    Non-medical: Not on file  Tobacco Use  . Smoking status: Never Smoker  . Smokeless tobacco: Never Used  Substance and Sexual Activity  . Alcohol use: No  . Drug use: No  . Sexual activity: Not on file  Lifestyle  . Physical activity:    Days per week: Not on file    Minutes per session: Not on file  . Stress: Not on file  Relationships  . Social connections:    Talks on phone: Not on file    Gets together: Not on file    Attends religious service: Not on file    Active member of club or organization: Not on file    Attends meetings of clubs or organizations: Not on file    Relationship status: Not on file  . Intimate partner violence:    Fear of current or ex partner: Not on file    Emotionally abused: Not on file    Physically abused: Not on file    Forced sexual activity: Not on file  Other Topics Concern  . Not on file  Social History Narrative  . Not on file   Family History  Problem Relation Age of Onset  . Kidney failure Mother   . Diabetes Mother   . Aortic aneurysm Father   . Coronary artery disease Father   . Heart attack Father   . Diabetes Sister   . Diabetes  Brother   . Kidney failure Brother       VITAL SIGNS BP (!) 171/46   Pulse 79   Temp 98.6 F (37 C)   Resp 18   Ht 5\' 3"  (1.6 m)   Wt 220 lb 12.8 oz (100.2 kg)   LMP 02/01/1986 (Approximate)   SpO2 98%   BMI 39.11 kg/m   Outpatient Encounter Medications as of 09/04/2018  Medication Sig  . acetaminophen (TYLENOL) 325 MG tablet Take 2 tablets (650 mg total) by mouth every 6 (six) hours  as needed for mild pain, fever or headache (or Fever >/= 101).  [name] albuterol (PROVENTIL HFA;VENTOLIN HFA) 108 (90 Base) MCG/ACT inhaler Inhale 2 puffs into the lungs every 6 (six) hours as needed for wheezing or shortness of breath.  [name] b complex-vitamin c-folic acid (NEPHRO-VITE) 0.8 MG TABS tablet Take 1 tablet by mouth daily.  . carvedilol (COREG) 25 MG tablet Take 25 mg by mouth 2 (two) times daily.   . Cholecalciferol (VITAMIN D3) 5000 UNITS TABS Take 5,000 Units by mouth daily.  . ferrous fumarate-iron polysaccharide complex (TANDEM) 162-115.2 MG CAPS capsule Take 1 capsule by mouth daily.  [name] FLUoxetine (PROZAC) 20 MG tablet Take 20 mg by mouth daily.   . furosemide (LASIX) 40 MG tablet Take 20-80 mg by mouth. Sunday, Tuesday and Saturday pt takes 80 mg in the morning. Thursdays 20 mg only.  . hydrALAZINE (APRESOLINE) 50 MG tablet Take 50 mg by mouth 2 (two) times daily.   [name] HYDROcodone-acetaminophen (NORCO/VICODIN) 5-325 MG tablet Take 1 tablet by mouth every 6 (six) hours as needed for severe pain.  . Infant Care Products [facility] EX) Apply liberal amount topically to area of skin irritation as needed.  OK to leave at bedside  . isosorbide mononitrate (IMDUR) 30 MG 24 hr tablet Take 30 mg by mouth at bedtime.   [name] levothyroxine (SYNTHROID, LEVOTHROID) 75 MCG tablet Take 75 mcg by mouth daily.   [name] lidocaine-prilocaine (EMLA) cream Apply 1 application topically as needed (for pain).  . methocarbamol (ROBAXIN) 750 MG tablet Take 750 mg by mouth every 8 (eight) hours. muscle spasms and pain  management  . midodrine (PROAMATINE) 5 MG tablet Take 1 tablet (5 mg total) by mouth 2 (two) times daily with a meal.  . NON FORMULARY Diet Type: renal, NAS, NCS, 1200 ml fluid restriction  . Nutritional Supplements (PROMOD) LIQD Take 30 mLs by mouth 2 (two) times daily. protein calorie malnutrition albumin 2.6  . nystatin (MYCOSTATIN/NYSTOP) 100000 UNIT/GM POWD Apply 1 g topically as needed (for irritation).   . OXYGEN Inhale 2 L/min into the lungs continuous.  . potassium chloride SA (K-DUR,KLOR-CON) 20 MEQ tablet Take 20 mEq by mouth daily.  . Probiotic Product (RISA-BID PROBIOTIC) TABS Take 1 tablet by mouth 2 (two) times daily.  . rosuvastatin (CRESTOR) 40 MG tablet Take 40 mg by mouth every evening.   Orlie Dakin Sodium (SENNA PLUS) 8.6-50 MG CAPS Take 2 capsules by mouth 2 (two) times daily.  [name] telmisartan (MICARDIS) 80 MG tablet Take 80 mg by mouth daily. At night  . traMADol (ULTRAM) 50 MG tablet Take 1 tablet (50 mg total) by mouth every 6 (six) hours as needed for moderate pain or severe pain.  . traZODone (DESYREL) 100 MG tablet Take 100 mg by mouth at bedtime.   . triamcinolone (KENALOG) 0.5 % cream Apply 1 application topically as needed (for irritation).   [name] UNABLE TO FIND CPAP @@ Bedtime  . [DISCONTINUED] albuterol (PROVENTIL HFA;VENTOLIN HFA) 108 (90 Base) MCG/ACT inhaler Inhale 2 puffs into the lungs every 4 (four) hours as needed for wheezing or shortness of breath. (Patient not taking: Reported on 09/04/2018)   No facility-administered encounter medications on file as of 09/04/2018.      SIGNIFICANT DIAGNOSTIC EXAMS  PREVIOUS  08-11-18: chest x-ray: Mild central venous congestion. Midline sternotomy.  08-11-18; right hip and pelvic x-ray: Acute intertrochanteric RIGHT femur fracture  TODAY:   09-01-18: chest x-ray: Mild central pulmonary vascular congestion. No pulmonary edema or pleural effusion.  LABS REVIEWED PREVIOUS:   12-26-17: chol 128; ldl 46;  trig 115; hdl 59  07-03-18: tsh 3.48 08-11-18: wbc 10.2; hgb 9.8; hct 29.3; mcv 107.3; plt 90 glucose 147; ldl 16; creat 2.20; k+ 3.6; na++ 133; ca 8.1; liver normal albumin 3.3 folate 31.0;  Vit B 12: 4.9 phos 2.7; mag 1.8 pre-albumin 18.8ca ionized 4.6  08-13-18: glucose 132; bun 37; creat 4.39; k+ 3.8; na++ 132; ca 8.3 PTH 59 08-15-18: wbc 8.7; hgb 7.1; hct 20.7'; mcv 106.7; plt 128 glucose 132; bun 37; creat 3.31; k+ 3.5; na++ 132; ca 8.4 albumin 2.6  08-20-18: wbc 12.1;hgb 7.6; hct 22.8; mcv 100.4; plt 151  TODAY;   08-21-18: wbc 9.9; hgb 9.4; hct 28.4; mcv 97.3; plt 141; glucose 110; bun 22; creat 2.04; k+ 3.4; na++ 134; ca 8.1  08-25-18: wbc 8.0; hgb 9.6; hct 29.4; mcv 101.4; plt 130 glucose 144; bun 28; creat 2.55; k+ 3.2; na++ 130; ca 8.3 liver normal albumin 3.1  08-31-18: glucose 131; bun 23; creat 2.14; k+ 3.6; na++ 131; ca 8.1  09-01-18: wbc 7.0; hgb 9.9; hct 30.7; mcv 101.3; plt 160; glucose 124; bun 18; creat 2.20; k+ 37; na++ 132; ca 8.5; liver normal albumin 3.2   Review of Systems  Constitutional: Negative for malaise/fatigue.  Respiratory: Positive for cough, sputum production and shortness of breath.   Cardiovascular: Negative for chest pain, palpitations and leg swelling.  Gastrointestinal: Negative for abdominal pain, constipation and heartburn.  Musculoskeletal: Negative for back pain, joint pain and myalgias.  Skin: Negative.   Neurological: Negative for dizziness.  Psychiatric/Behavioral: The patient is not nervous/anxious.     Physical Exam Constitutional:      General: She is not in acute distress.    Appearance: She is well-developed. She is obese. She is not diaphoretic.  Neck:     Musculoskeletal: Neck supple.     Thyroid: No thyromegaly.  Cardiovascular:     Rate and Rhythm: Normal rate and regular rhythm.     Pulses: Normal pulses.     Heart sounds: Murmur present.     Comments: 1/6 Pulmonary:     Effort: Pulmonary effort is normal. No respiratory  distress.     Comments: Crackles and rhonchi present  02 dependent Uses CPAP at night  Abdominal:     General: Bowel sounds are normal. There is no distension.     Palpations: Abdomen is soft.     Tenderness: There is no abdominal tenderness.  Musculoskeletal:     Right lower leg: Edema present.     Left lower leg: No edema.     Comments: Right leg edema Is able to move all extremities  Status post right hip fracture.    Lymphadenopathy:     Cervical: No cervical adenopathy.  Skin:    General: Skin is warm and dry.     Comments: Left upper extremity A/V fistula  + thrill/+bruit    Neurological:     Mental Status: She is alert and oriented to person, place, and time.      ASSESSMENT/ PLAN:  TODAY;   1. COPD with asthma: is worse is on 02 2L continuous; will continue albuterol 2 puffs every 6 hours as needed; will begin duoneb every 6 hours through 09-10-18 then prn; will begin advair 100/50 1 puff twice daily   2. Acquired hypothyroidism: is stable tsh 3.48: will continue synthroid 75 mcg daily  3. Dyslipidemia associated with diabetes type 2 mellitus: is stable LDL 46; will continue  crestor 40 mg daily   4. Anemia due to end stage renal failure: is stable hgb 9.4; is on EPO with dialysis will contoinue tandem 162-115.2 daily   PREVIOUS  5. Chronic kidney disease with end stage renal disease on dialysis due to type 2 diabetes mellitus: is stable is on hemodialysis three times daily will continue nephrovit daily 1200 cc fluid restriction is followed by nephropathy  6. Diabetes type 2 with renal manifestation: is stable hgb a1c 5.7   7. Chronic cerebrovascular accident : is neurologically stable will continue to monitor her status.   8. Chronic depression: is stable will continue prozac 20 mg daily and trazodone 100 mg nightly   9.  Chronic constipation: is stable will continue senna s  2 tabs twice daily   10. intertrochanteric RIGHT femur fracture: is stable will follow  up with orthopedics as directed and will continue therapy; will continue will continue robaxin 750 mg three times daily ; has ultram 50 mg every 6 hours as needed and vicodin 5/325 mg every 6 hours as needed   11. Protein calorie malnutrition, severe: is without change albumin 3.2 will continue prostat 30 cc twice daily   12. Coronary artery disease involving native heart with angina pectoris: is status post cabg will continue coreg 25 mg twice daily imdur 30 mg daily   13. Diastolic heart failure; chronic: is stable will continue lasix 80 mg on Sunday Tuesday Saturday and 20 mg on Thursday; coreg 25 mg twice daily imdur 30 mg daily   14. Hypertensive heart and renal disease with congestive heart failure and end stage renal disease: is stable b/p 171/46: will continue coreg 25 mg twice daily micardis 80 mg daily apresoline 50 mg twice daily   15. Obstructive sleep apnea: is stable on CPAP nightly    Will setup a palliative care consult.       MD is aware of resident's narcotic use and is in agreement with current plan of care. We will attempt to wean resident as apropriate   Ok Edwards NP [facility]  Contact [phone] Monday through Friday 8am- 5pm  After hours call [phone]

## 2018-09-05 DIAGNOSIS — D631 Anemia in chronic kidney disease: Secondary | ICD-10-CM | POA: Insufficient documentation

## 2018-09-05 DIAGNOSIS — N186 End stage renal disease: Secondary | ICD-10-CM

## 2018-09-06 ENCOUNTER — Encounter
Admission: RE | Admit: 2018-09-06 | Discharge: 2018-09-06 | Disposition: A | Discharge status: Home / Self Care | Payer: Medicare Other | Source: Ambulatory Visit | Attending: Internal Medicine | Admitting: Internal Medicine

## 2018-09-11 ENCOUNTER — Non-Acute Institutional Stay: Payer: Medicare Other | Admitting: Nurse Practitioner

## 2018-09-11 VITALS — BP 142/78 | HR 88 | Resp 20 | Wt 220.0 lb

## 2018-09-11 DIAGNOSIS — Z515 Encounter for palliative care: Secondary | ICD-10-CM

## 2018-09-11 DIAGNOSIS — R52 Pain, unspecified: Secondary | ICD-10-CM

## 2018-09-11 DIAGNOSIS — R0602 Shortness of breath: Secondary | ICD-10-CM

## 2018-09-12 ENCOUNTER — Other Ambulatory Visit: Payer: Self-pay | Admitting: Adult Health

## 2018-09-12 ENCOUNTER — Encounter: Payer: Self-pay | Admitting: Adult Health

## 2018-09-12 ENCOUNTER — Non-Acute Institutional Stay (SKILLED_NURSING_FACILITY): Payer: Medicare Other | Admitting: Adult Health

## 2018-09-12 DIAGNOSIS — E1122 Type 2 diabetes mellitus with diabetic chronic kidney disease: Secondary | ICD-10-CM

## 2018-09-12 DIAGNOSIS — I6930 Unspecified sequelae of cerebral infarction: Secondary | ICD-10-CM

## 2018-09-12 DIAGNOSIS — N186 End stage renal disease: Secondary | ICD-10-CM

## 2018-09-12 DIAGNOSIS — Z515 Encounter for palliative care: Secondary | ICD-10-CM | POA: Insufficient documentation

## 2018-09-12 DIAGNOSIS — K5909 Other constipation: Secondary | ICD-10-CM

## 2018-09-12 DIAGNOSIS — R52 Pain, unspecified: Secondary | ICD-10-CM | POA: Insufficient documentation

## 2018-09-12 DIAGNOSIS — Z8673 Personal history of transient ischemic attack (TIA), and cerebral infarction without residual deficits: Secondary | ICD-10-CM

## 2018-09-12 DIAGNOSIS — Z992 Dependence on renal dialysis: Secondary | ICD-10-CM

## 2018-09-12 MED ORDER — HYDROCODONE-ACETAMINOPHEN 5-325 MG PO TABS: 1.0000 | ORAL_TABLET | Freq: Four times a day (QID) | ORAL | 0 refills | Status: DC | PRN

## 2018-09-12 NOTE — Progress Notes (Signed)
Location:   [facility] Room Number: [place] of Service:  SNF (31)   CODE STATUS: Full Code  Allergies  Allergen Reactions  . Ace Inhibitors Other (See Comments)    Reaction:  Unknown   . Amlodipine Other (See Comments)    Reaction:  Unknown   . Clonidine Hydrochloride Other (See Comments)    Reaction:  Unknown   . Codeine Hives  . Latex Hives  . Metformin Nausea And Vomiting  . Tape Rash    Chief Complaint  Patient presents with  . Medical Management of Chronic Issues    Chronic cerebrovascular accident; controlled type 2 diabetes mellitus with chronic kidney disease on chronic dialysis without long term current use of insulin; chronic kidney disease with end stage renal disease on dialysis due to type 2 diabetes mellitus chronic constipation.     HPI:  She is a 83 year old short term rehab patient being seen for the management of her chronic illnesses: cva; diabetes; esrd; constipation. She denies any problems with constipation or heart burn. No complaints of uncontrolled pain; no anxiety or insomnia.    Past Medical History:  Diagnosis Date  . ACE-inhibitor cough   . Aortic valve disorder    Echo in 2009 showed mean aortic valve gradient of 13 mmHg, suggesting very mild stenosis. Echo (12/10) suggested aortic sclerosis only  . Carotid artery disease (HCC)    mild, carotid dopplers 12/2009  . CHF (congestive heart failure) ([place])   . COPD (chronic obstructive pulmonary disease) ([place])   . Coronary artery disease    s/p anterior MI in 1996 followed by CABG. Lexiscan myoview (4/11): EF 67%, normal perfusion with no evidence for ischemia or infarction.   . Diabetes mellitus    type 2  . Diastolic heart failure    most recent echo (12/10) showed EF 55-60% with mild LVH, grade I diastolic dysfunction, mild LAE.  [name] ESRD (end stage renal disease) on dialysis ([place])   . Hyperlipidemia   . Hypertension    resistant hyptertension times many years.  the patient does have renal artery stenosis. she has tried calcium channel blockers in the past and states that she would not take them now because she had some problems with her gums which her dentist identified as calcium-channel blocker side effects.  . Hypothyroidism   . Mild asthma    PFT 02/05/10 FEV1 1.38, FEV1% 73, TLC 3.78 (86%), DLCO 48%, +BD  . Myocardial infarction ([place])   . Obesity   . Obesity hypoventilation syndrome ([place])       . Obstructive sleep apnea    PSG 01/05/2006 AHI 24.8, CPAP 9cm H2O  . Pulmonary nodule, right   . Renal artery stenosis [facility])    The patient has an occluded right renal artery and an atrophic right kidney. There is 20% left renal artery stenosis. This was seen by catheterization in 2007  . Stroke [facility])     Past Surgical History:  Procedure Laterality Date  . A/V FISTULAGRAM N/A 01/25/2018   Procedure: A/V FISTULAGRAM;  Surgeon: Algernon Huxley, MD;  Location: [place] CV LAB;  Service: Cardiovascular;  Laterality: N/A;  . ABDOMINAL HYSTERECTOMY  1985  . AV FISTULA PLACEMENT    . CATARACT EXTRACTION  2010  . CORONARY ARTERY BYPASS GRAFT  1996  . DIALYSIS FISTULA CREATION    . [place]   right  . INTRAMEDULLARY (IM) NAIL INTERTROCHANTERIC Right 08/17/2018  Procedure: INTRAMEDULLARY (IM) NAIL INTERTROCHANTRIC;  Surgeon: Earnestine Leys, MD;  Location: [facility] ORS;  Service: Orthopedics;  Laterality: Right;  . PERIPHERAL VASCULAR CATHETERIZATION Left 01/06/2015   Procedure: A/V Shuntogram/Fistulagram;  Surgeon: Katha Cabal, MD;  Location: [place] CV LAB;  Service: Cardiovascular;  Laterality: Left;  . PERIPHERAL VASCULAR CATHETERIZATION N/A 02/17/2015   Procedure: Dialysis/Perma Catheter Removal;  Surgeon: Katha Cabal, MD;  Location: [place] CV LAB;  Service: Cardiovascular;  Laterality: N/A;  . PERIPHERAL VASCULAR CATHETERIZATION N/A 01/28/2016   Procedure: A/V Shuntogram/Fistulagram;  Surgeon: Algernon Huxley, MD;   Location: [place] CV LAB;  Service: Cardiovascular;  Laterality: N/A;  . PERIPHERAL VASCULAR CATHETERIZATION N/A 01/28/2016   Procedure: A/V Shunt Intervention;  Surgeon: Algernon Huxley, MD;  Location: [place] CV LAB;  Service: Cardiovascular;  Laterality: N/A;  . TUBAL LIGATION  1973    Social History   Socioeconomic History  . Marital status: Married    Spouse name: Not on file  . Number of children: Not on file  . Years of education: Not on file  . Highest education level: Not on file  Occupational History  . Not on file  Social Needs  . Financial resource strain: Not on file  . Food insecurity:    Worry: Not on file    Inability: Not on file  . Transportation needs:    Medical: Not on file    Non-medical: Not on file  Tobacco Use  . Smoking status: Never Smoker  . Smokeless tobacco: Never Used  Substance and Sexual Activity  . Alcohol use: No  . Drug use: No  . Sexual activity: Not on file  Lifestyle  . Physical activity:    Days per week: Not on file    Minutes per session: Not on file  . Stress: Not on file  Relationships  . Social connections:    Talks on phone: Not on file    Gets together: Not on file    Attends religious service: Not on file    Active member of club or organization: Not on file    Attends meetings of clubs or organizations: Not on file    Relationship status: Not on file  . Intimate partner violence:    Fear of current or ex partner: Not on file    Emotionally abused: Not on file    Physically abused: Not on file    Forced sexual activity: Not on file  Other Topics Concern  . Not on file  Social History Narrative  . Not on file   Family History  Problem Relation Age of Onset  . Kidney failure Mother   . Diabetes Mother   . Aortic aneurysm Father   . Coronary artery disease Father   . Heart attack Father   . Diabetes Sister   . Diabetes Brother   . Kidney failure Brother       VITAL SIGNS BP (!) 167/49   Pulse 79    Temp 98.6 F (37 C)   Resp 18   Ht 5\' 3"  (1.6 m)   Wt 207 lb 12.8 oz (94.3 kg)   LMP 02/01/1986 (Approximate)   SpO2 99%   BMI 36.81 kg/m   Outpatient Encounter Medications as of 09/12/2018  Medication Sig  . acetaminophen (TYLENOL) 325 MG tablet Take 2 tablets (650 mg total) by mouth every 6 (six) hours as needed for mild pain, fever or headache (or Fever >/= 101).  [name] albuterol (PROVENTIL HFA;VENTOLIN  HFA) 108 (90 Base) MCG/ACT inhaler Inhale 2 puffs into the lungs every 6 (six) hours as needed for wheezing or shortness of breath.  [name] b complex-vitamin c-folic acid (NEPHRO-VITE) 0.8 MG TABS tablet Take 1 tablet by mouth daily.  . carvedilol (COREG) 25 MG tablet Take 25 mg by mouth 2 (two) times daily.   . Cholecalciferol (VITAMIN D3) 5000 UNITS TABS Take 5,000 Units by mouth daily.  . ferrous fumarate-iron polysaccharide complex (TANDEM) 162-115.2 MG CAPS capsule Take 1 capsule by mouth daily.  [name] FLUoxetine (PROZAC) 20 MG tablet Take 20 mg by mouth daily.   . Fluticasone-Salmeterol (ADVAIR) 100-50 MCG/DOSE AEPB Inhale 1 puff into the lungs 2 (two) times daily.  . furosemide (LASIX) 40 MG tablet Take 20-80 mg by mouth. Sunday, Tuesday and Saturday pt takes 80 mg in the morning. Thursdays 20 mg only.  . hydrALAZINE (APRESOLINE) 50 MG tablet Take 50 mg by mouth 2 (two) times daily.   [name] HYDROcodone-acetaminophen (NORCO/VICODIN) 5-325 MG tablet Take 1 tablet by mouth every 6 (six) hours as needed for severe pain.  . Infant Care Products [facility] EX) Apply liberal amount topically to area of skin irritation as needed.  OK to leave at bedside  . isosorbide mononitrate (IMDUR) 30 MG 24 hr tablet Take 30 mg by mouth at bedtime.   [name] levothyroxine (SYNTHROID, LEVOTHROID) 75 MCG tablet Take 75 mcg by mouth daily.   [name] lidocaine-prilocaine (EMLA) cream Apply 1 application topically as needed (for pain).  . methocarbamol (ROBAXIN) 750 MG tablet Take 750 mg by mouth every 8 (eight) hours. muscle spasms  and pain management  . midodrine (PROAMATINE) 5 MG tablet Take 1 tablet (5 mg total) by mouth 2 (two) times daily with a meal.  . NON FORMULARY Diet Type: Downgrade pt to Dysphagia 3 consistencies, continue thin liquids.  renal, NAS, NCS, 1200 ml fluid restriction  . Nutritional Supplements (PROMOD) LIQD Take 30 mLs by mouth 2 (two) times daily. protein calorie malnutrition albumin 2.6  . nystatin (MYCOSTATIN/NYSTOP) 100000 UNIT/GM POWD Apply 1 g topically as needed (for irritation).   . OXYGEN Inhale 2 L/min into the lungs continuous.  . potassium chloride SA (K-DUR,KLOR-CON) 20 MEQ tablet Take 20 mEq by mouth daily.  . rosuvastatin (CRESTOR) 40 MG tablet Take 40 mg by mouth every evening.   Orlie Dakin Sodium (SENNA PLUS) 8.6-50 MG CAPS Take 2 capsules by mouth 2 (two) times daily.  [name] telmisartan (MICARDIS) 80 MG tablet Take 80 mg by mouth daily. At night  . traMADol (ULTRAM) 50 MG tablet Take 1 tablet (50 mg total) by mouth every 6 (six) hours as needed for moderate pain or severe pain.  . traZODone (DESYREL) 100 MG tablet Take 100 mg by mouth at bedtime.   . triamcinolone (KENALOG) 0.5 % cream Apply 1 application topically as needed (for irritation).   [name] UNABLE TO FIND CPAP @@ Bedtime   No facility-administered encounter medications on file as of 09/12/2018.      SIGNIFICANT DIAGNOSTIC EXAMS  PREVIOUS  08-11-18: chest x-ray: Mild central venous congestion. Midline sternotomy.  08-11-18; right hip and pelvic x-ray: Acute intertrochanteric RIGHT femur fracture  09-01-18: chest x-ray: Mild central pulmonary vascular congestion. No pulmonary edema or pleural effusion.  NO NEW EXAMS.   LABS REVIEWED PREVIOUS:   12-26-17: chol 128; ldl 46; trig 115; hdl 59  07-03-18: tsh 3.48 08-11-18: wbc 10.2; hgb 9.8; hct 29.3; mcv 107.3; plt 90 glucose 147; ldl 16; creat 2.20; k+ 3.6; na++ 133;  ca 8.1; liver normal albumin 3.3 folate 31.0;  Vit B 12: 4.9 phos 2.7; mag 1.8 pre-albumin 18.8ca  ionized 4.6  08-13-18: glucose 132; bun 37; creat 4.39; k+ 3.8; na++ 132; ca 8.3 PTH 59 08-15-18: wbc 8.7; hgb 7.1; hct 20.7'; mcv 106.7; plt 128 glucose 132; bun 37; creat 3.31; k+ 3.5; na++ 132; ca 8.4 albumin 2.6  08-20-18: wbc 12.1;hgb 7.6; hct 22.8; mcv 100.4; plt 151 08-21-18: wbc 9.9; hgb 9.4; hct 28.4; mcv 97.3; plt 141; glucose 110; bun 22; creat 2.04; k+ 3.4; na++ 134; ca 8.1  08-25-18: wbc 8.0; hgb 9.6; hct 29.4; mcv 101.4; plt 130 glucose 144; bun 28; creat 2.55; k+ 3.2; na++ 130; ca 8.3 liver normal albumin 3.1  08-31-18: glucose 131; bun 23; creat 2.14; k+ 3.6; na++ 131; ca 8.1  09-01-18: wbc 7.0; hgb 9.9; hct 30.7; mcv 101.3; plt 160; glucose 124; bun 18; creat 2.20; k+ 37; na++ 132; ca 8.5; liver normal albumin 3.2  NO NEW LABS.    Review of Systems  Constitutional: Negative for malaise/fatigue.  Respiratory: Negative for cough and shortness of breath.   Cardiovascular: Negative for chest pain, palpitations and leg swelling.  Gastrointestinal: Negative for abdominal pain, constipation and heartburn.  Musculoskeletal: Negative for back pain, joint pain and myalgias.  Skin: Negative.   Neurological: Negative for dizziness.  Psychiatric/Behavioral: The patient is not nervous/anxious.     Physical Exam Constitutional:      General: She is not in acute distress.    Appearance: She is well-developed. She is obese. She is not diaphoretic.  Neck:     Musculoskeletal: Neck supple.     Thyroid: No thyromegaly.  Cardiovascular:     Rate and Rhythm: Normal rate and regular rhythm.     Pulses: Normal pulses.     Heart sounds: Murmur present.     Comments: 1/6 Pulmonary:     Effort: Pulmonary effort is normal. No respiratory distress.     Breath sounds: Normal breath sounds.     Comments: 02 dependent Uses CPAP at night  Abdominal:     General: Bowel sounds are normal. There is no distension.     Palpations: Abdomen is soft.     Tenderness: There is no abdominal  tenderness.  Musculoskeletal:     Right lower leg: Edema present.     Left lower leg: No edema.     Comments:  Right leg edema Is able to move all extremities  Status post right hip fracture.     Lymphadenopathy:     Cervical: No cervical adenopathy.  Skin:    General: Skin is warm and dry.     Comments:  Left upper extremity A/V fistula  + thrill/+bruit     Neurological:     Mental Status: She is alert and oriented to person, place, and time.  Psychiatric:        Mood and Affect: Mood normal.      ASSESSMENT/ PLAN:  TODAY;   1. Chronic kidney disease with end stage renal disease on dialysis due to type 2 diabetes mellitus: is stable is on hemodialysis three times daily will continue nephrovit daily 1200 cc fluid restriction is followed by nephropathy  2. Diabetes type 2 with renal manifestation: is stable hgb a1c 5.7   3. Chronic cerebrovascular accident : is neurologically stable will continue to monitor her status.   4. Chronic depression: is stable will continue prozac 20 mg daily and trazodone 100 mg nightly   PREVIOUS  5.  Chronic constipation: is stable will continue senna s  2 tabs twice daily   6. intertrochanteric RIGHT femur fracture: is stable will follow up with orthopedics as directed and will continue therapy; will continue will continue robaxin 750 mg three times daily ; has ultram 50 mg every 6 hours as needed and vicodin 5/325 mg every 6 hours as needed   7. Protein calorie malnutrition, severe: is without change albumin 3.2 will continue prostat 30 cc twice daily   8. Coronary artery disease involving native heart with angina pectoris: is status post cabg will continue coreg 25 mg twice daily imdur 30 mg daily   9. Diastolic heart failure; chronic: is stable will continue lasix 80 mg on Sunday Tuesday Saturday and 20 mg on Thursday; coreg 25 mg twice daily imdur 30 mg daily   10. Hypertensive heart and renal disease with congestive heart failure and end  stage renal disease: is stable b/p 171/46: will continue coreg 25 mg twice daily micardis 80 mg daily apresoline 50 mg twice daily   11. Obstructive sleep apnea: is stable on CPAP nightly   12. COPD with asthma: is worse is on 02 2L continuous; will continue albuterol 2 puffs every 6 hours as needed; will begin duoneb every 6 hours through 09-10-18 then prn; will begin advair 100/50 1 puff twice daily   13. Acquired hypothyroidism: is stable tsh 3.48: will continue synthroid 75 mcg daily  14. Dyslipidemia associated with diabetes type 2 mellitus: is stable LDL 46; will continue crestor 40 mg daily   15. Anemia due to end stage renal failure: is stable hgb 9.4; is on EPO with dialysis will contoinue tandem 162-115.2 daily    MD is aware of resident's narcotic use and is in agreement with current plan of care. We will attempt to wean resident as apropriate   Ok Edwards NP [facility]  Contact [phone] Monday through Friday 8am- 5pm  After hours call [phone]

## 2018-09-12 NOTE — Progress Notes (Signed)
[facility] Telephone: [phone] Fax: [phone]  PATIENT NAME: Dawn Bradford DOB: [birth year] MRN: [id number]  PRIMARY CARE PROVIDER:   Hortencia Pilar, MD  REFERRING PROVIDER:  Dr Dawn Bradford Place RESPONSIBLE PARTY:  daughter Dawn Bradford at [phone], daughter Dawn Bradford at [phone], or husband Dawn Bradford at [phone]    ASSESSMENT:     I visited and observed Dawn Bradford. We talked about purpose for palliative medicine visit and she was an agreement. Talked about how she was feeling today. We talked about symptoms of pain which she explained she does have at times on her right leg where she had surgery. Dawn Bradford endorses that the Ultram does help effectively. We talked about shortness of breath with recent COPD exacerbation. We talked about the duoneb treatments which seem to be helping considerably. We talked about continuous oxygen dependency and CPAP. We talked about chronic disease progression of end-stage renal disease. We talked about her being on hemodialysis for about 4 years and the cause of her renal failure was from diabetes. We talked about hemodialysis and recent treatments where she has continued to tolerate. We talked about recent hospitalization with hip fracture after a fall. We talked about her functional level prior to the fall where she was able to perform her adl's and ambulatory. We talked about living at home with her husband and having a caregiver that came in to help with their care such as preparing meals and housekeeping. We talked about family [employer] and her three daughters. We talked about her work with therapy and the machines that she has been using. She talked about it being very slow progression and that it is wearing her out. She talked about continuing to try and wishes are to return home to previous Baseline of being independent. She talked about the frustration of not being able to walk and having to be lifted to the chair. We  talked about role of palliative medicine and plan of care. She does remain a full code with aggressive interventions at present time. I have attempted to contact her daughter for update on palliative care visit. Discuss will follow up in 2 weeks if needed or sooner should she declined. Recommendations to continue therapy once she returns home with in-home palliative care. I updated nursing staff no new changes to current goals of care. Therapeutic listening and emotional support provided. Dawn Bradford [facility] she was thankful for palliative medicine visit today. Questions answered satisfaction. Contact information.   12 / 28 / 2019 sodium 132, potassium 3.7, chloride 97, Co2 28, calcium 8.5, bun 18, creatinine 2.20, glucose 124, albumin 3.2, total protein 5.9, WBC 7.0, hemoglobin 9.9, hematocrit 30.7, platelets 160  RECOMMENDATIONS and PLAN:  1. Palliative care encounter Z51.5; Palliative medicine team will continue to support patient, patient's family, and medical team. Visit consisted of counseling and education dealing with the complex and emotionally intense issues of symptom management and palliative care in the setting of serious and potentially life-threatening illness  2. Pain R52.0 secondary to Hip fx s/p sgy; will continue to monitor on pain scale, monitor efficacy vs adverse side effects. Continue with current pain regimen with Robaxin 750 mg tid, my Ultram 50 mg 6 hours as needed for pain and Vicodin 5 / 325 mg 6 hours as needed for pain.  3. Dyspneic R06.00 secondary to COPD/congestive heart failure remain stable at present time. Continue with continuous O2/inhalation therapy; CPAP qhs  I spent 75 minutes providing this consultation,  From 9:00am to  10:15am. More than 50% of the time in this consultation was spent coordinating communication.   HISTORY OF PRESENT ILLNESS:  [name] is a 83 y.o. year old female with multiple medical problems including End-stage renal disease on dialysis Monday,  Wednesday, Friday), diastolic heart failure, coronary artery disease s/p cabg, myocardial infarction,  aortic valve disorder, COPD, congestive heart failure, carotid artery disease , CVA, right artery stenosis with occluded right renal artery, right pulmonary nodule, obstructive sleep apnea, obesity hypoventilation syndrome, hypertension, hyperlipidemia, diabetes, hypothyroidism, asthma, cataract extraction, abdominal hysterectomy. Hospitalized 12 / 6 / 2019 to 12 / 17 / 2019 after a fall with right hip fracture, right intertrochanteric femoral neck fracture s/p sgy 08/17/2018. Hospitalization Complicated by anemia of chronic disease requiring transfusion with dialysis. Obstructive sleep apnea use a CPAP at night. Discharged short-term rehab at [facility] place. Last primary provider visit 12/31 / 2019 following a visit to the emergency department for COPD exacerbation. She continued on 2 liters of oxygen continuous, and Elation therapy with albuterol, Advair and added duonebs Q 6 hours. Anemia was stable hemoglobin 9.4 on Epi with dialysis and continued tandem. She continued on hemodialysis with nephrovite daily with a 1200 cc fluid restriction Follow by nephrology. Depression she continued on Prozac with trazodone nightly. She has been receiving for pain secondary to intertrochanteric femur fracture Robaxin 750 mg tid, my Ultram 50 mg 6 hours as needed for pain and Vicodin 5 / 325 mg 6 hours as needed for pain. Protein calorie malnutrition severe without change in albumin 3.2 continue on prostat bid. Diastolic congestive heart failure chronic remains on diuretic, Coreg. She was discharged to short-term rehab where she's currently remains. She is a [employer] fast streamer to the wheelchair where she is able to sit up. She does require assistance with ADL. She is able to feed herself with a fair appetite. She has been working with their being remains on continue if oxygen. Staff endorses she is able to verbalize her needs. At  present she is sitting in the wheelchair, appears debilitated, chronically ill but comfortable. No visitors present . Palliative Care was asked to help address goals of care.   CODE STATUS: full  PPS: 40% HOSPICE ELIGIBILITY/DIAGNOSIS: TBD  PAST MEDICAL HISTORY:  Past Medical History:  Diagnosis Date  . ACE-inhibitor cough   . Aortic valve disorder    Echo in 2009 showed mean aortic valve gradient of 13 mmHg, suggesting very mild stenosis. Echo (12/10) suggested aortic sclerosis only  . Carotid artery disease (HCC)    mild, carotid dopplers 12/2009  . CHF (congestive heart failure) ([place])   . COPD (chronic obstructive pulmonary disease) ([place])   . Coronary artery disease    s/p anterior MI in 1996 followed by CABG. Lexiscan myoview (4/11): EF 67%, normal perfusion with no evidence for ischemia or infarction.   . Diabetes mellitus    type 2  . Diastolic heart failure    most recent echo (12/10) showed EF 55-60% with mild LVH, grade I diastolic dysfunction, mild LAE.  [name] ESRD (end stage renal disease) on dialysis ([place])   . Hyperlipidemia   . Hypertension    resistant hyptertension times many years. the patient does have renal artery stenosis. she has tried calcium channel blockers in the past and states that she would not take them now because she had some problems with her gums which her dentist identified as calcium-channel blocker side effects.  . Hypothyroidism   . Mild asthma    PFT 02/05/10  FEV1 1.38, FEV1% 73, TLC 3.78 (86%), DLCO 48%, +BD  . Myocardial infarction ([place])   . Obesity   . Obesity hypoventilation syndrome ([place])       . Obstructive sleep apnea    PSG 01/05/2006 AHI 24.8, CPAP 9cm H2O  . Pulmonary nodule, right   . Renal artery stenosis [facility])    The patient has an occluded right renal artery and an atrophic right kidney. There is 20% left renal artery stenosis. This was seen by catheterization in 2007  . Stroke [facility])     SOCIAL HX:  Social History   Tobacco Use  .  Smoking status: Never Smoker  . Smokeless tobacco: Never Used  Substance Use Topics  . Alcohol use: No    ALLERGIES:  Allergies  Allergen Reactions  . Ace Inhibitors Other (See Comments)    Reaction:  Unknown   . Amlodipine Other (See Comments)    Reaction:  Unknown   . Clonidine Hydrochloride Other (See Comments)    Reaction:  Unknown   . Codeine Hives  . Latex Hives  . Metformin Nausea And Vomiting  . Tape Rash     PERTINENT MEDICATIONS:  Outpatient Encounter Medications as of 09/11/2018  Medication Sig  . acetaminophen (TYLENOL) 325 MG tablet Take 2 tablets (650 mg total) by mouth every 6 (six) hours as needed for mild pain, fever or headache (or Fever >/= 101).  [name] albuterol (PROVENTIL HFA;VENTOLIN HFA) 108 (90 Base) MCG/ACT inhaler Inhale 2 puffs into the lungs every 6 (six) hours as needed for wheezing or shortness of breath.  [name] b complex-vitamin c-folic acid (NEPHRO-VITE) 0.8 MG TABS tablet Take 1 tablet by mouth daily.  . carvedilol (COREG) 25 MG tablet Take 25 mg by mouth 2 (two) times daily.   . Cholecalciferol (VITAMIN D3) 5000 UNITS TABS Take 5,000 Units by mouth daily.  . ferrous fumarate-iron polysaccharide complex (TANDEM) 162-115.2 MG CAPS capsule Take 1 capsule by mouth daily.  [name] FLUoxetine (PROZAC) 20 MG tablet Take 20 mg by mouth daily.   . furosemide (LASIX) 40 MG tablet Take 20-80 mg by mouth. Sunday, Tuesday and Saturday pt takes 80 mg in the morning. Thursdays 20 mg only.  . hydrALAZINE (APRESOLINE) 50 MG tablet Take 50 mg by mouth 2 (two) times daily.   [name] HYDROcodone-acetaminophen (NORCO/VICODIN) 5-325 MG tablet Take 1 tablet by mouth every 6 (six) hours as needed for severe pain.  . Infant Care Products [facility] EX) Apply liberal amount topically to area of skin irritation as needed.  OK to leave at bedside  . isosorbide mononitrate (IMDUR) 30 MG 24 hr tablet Take 30 mg by mouth at bedtime.   [name] levothyroxine (SYNTHROID, LEVOTHROID) 75 MCG tablet Take 75 mcg  by mouth daily.   [name] lidocaine-prilocaine (EMLA) cream Apply 1 application topically as needed (for pain).  . methocarbamol (ROBAXIN) 750 MG tablet Take 750 mg by mouth every 8 (eight) hours. muscle spasms and pain management  . midodrine (PROAMATINE) 5 MG tablet Take 1 tablet (5 mg total) by mouth 2 (two) times daily with a meal.  . NON FORMULARY Diet Type: renal, NAS, NCS, 1200 ml fluid restriction  . Nutritional Supplements (PROMOD) LIQD Take 30 mLs by mouth 2 (two) times daily. protein calorie malnutrition albumin 2.6  . nystatin (MYCOSTATIN/NYSTOP) 100000 UNIT/GM POWD Apply 1 g topically as needed (for irritation).   . OXYGEN Inhale 2 L/min into the lungs continuous.  . potassium chloride SA (K-DUR,KLOR-CON) 20 MEQ tablet Take 20 mEq  by mouth daily.  . rosuvastatin (CRESTOR) 40 MG tablet Take 40 mg by mouth every evening.   Orlie Dakin Sodium (SENNA PLUS) 8.6-50 MG CAPS Take 2 capsules by mouth 2 (two) times daily.  [name] telmisartan (MICARDIS) 80 MG tablet Take 80 mg by mouth daily. At night  . traMADol (ULTRAM) 50 MG tablet Take 1 tablet (50 mg total) by mouth every 6 (six) hours as needed for moderate pain or severe pain.  . traZODone (DESYREL) 100 MG tablet Take 100 mg by mouth at bedtime.   . triamcinolone (KENALOG) 0.5 % cream Apply 1 application topically as needed (for irritation).   [name] UNABLE TO FIND CPAP @@ Bedtime   No facility-administered encounter medications on file as of 09/11/2018.     PHYSICAL EXAM:   General: Obese, chronically ill appearing debilitated female Cardiovascular: regular rate and rhythm Pulmonary: clear ant fields Abdomen: soft, nontender, + bowel sounds GU: no suprapubic tenderness Extremities: + edema, no joint deformities Skin: no rashes Neurological: Weakness but otherwise nonfocal; non-ambulatory   Z , NP

## 2018-09-13 ENCOUNTER — Telehealth: Payer: Self-pay | Admitting: Nurse Practitioner

## 2018-09-13 NOTE — Telephone Encounter (Signed)
Message left to return call.

## 2018-09-14 ENCOUNTER — Telehealth: Payer: Self-pay | Admitting: Nurse Practitioner

## 2018-09-14 NOTE — Telephone Encounter (Signed)
I called Dawn Bradford, Dawn Bradford. We talked about purpose for PC visit, visit with Dawn Bradford. Briefly discussed pmh, progression chronic disease with recent complications secondary to hospitalization. We set a time for next pc visit with family meeting wed, 09/19/2018 at 10am. Contact information, therapuetic listening, emotional support provided. Questions answered.   Total time spent 25 minutes Documentation 10 minutes Phone discussion 15 minutes

## 2018-09-14 NOTE — Telephone Encounter (Signed)
[name] returned call and rescheduled PC visit and family meeting for 09/21/2018 at 10am at [facility]

## 2018-09-20 ENCOUNTER — Non-Acute Institutional Stay (SKILLED_NURSING_FACILITY): Payer: Medicare Other | Admitting: Adult Health

## 2018-09-20 ENCOUNTER — Other Ambulatory Visit: Payer: Self-pay | Admitting: Adult Health

## 2018-09-20 ENCOUNTER — Encounter: Payer: Self-pay | Admitting: Adult Health

## 2018-09-20 DIAGNOSIS — S72141S Displaced intertrochanteric fracture of right femur, sequela: Secondary | ICD-10-CM | POA: Diagnosis not present

## 2018-09-20 DIAGNOSIS — E1122 Type 2 diabetes mellitus with diabetic chronic kidney disease: Secondary | ICD-10-CM | POA: Diagnosis not present

## 2018-09-20 DIAGNOSIS — Z992 Dependence on renal dialysis: Secondary | ICD-10-CM

## 2018-09-20 DIAGNOSIS — J449 Chronic obstructive pulmonary disease, unspecified: Secondary | ICD-10-CM

## 2018-09-20 DIAGNOSIS — N186 End stage renal disease: Secondary | ICD-10-CM | POA: Diagnosis not present

## 2018-09-20 NOTE — Progress Notes (Signed)
Location:   [facility] Room Number: [place] of Service:  SNF (31)   CODE STATUS:  Full Code  Allergies  Allergen Reactions  . Ace Inhibitors Other (See Comments)    Reaction:  Unknown   . Amlodipine Other (See Comments)    Reaction:  Unknown   . Clonidine Hydrochloride Other (See Comments)    Reaction:  Unknown   . Codeine Hives  . Latex Hives  . Metformin Nausea And Vomiting  . Tape Rash    Chief Complaint  Patient presents with  . Acute Visit    Care Plan Meeting    HPI:  We have come for her routine care plan meeting. She is partial weight bearing on her right lower extremity. She is not able to remember those precautions. Her therapy will stop on 09-23-18 and will follow up with orthopedics on 10-02-18. If her weight bearing status changes will resume therapy. She denies any uncontrolled pain; no changes in appetite; no anxiety no insomnia. More than likely she will be a long term patient of SNF.   Past Medical History:  Diagnosis Date  . ACE-inhibitor cough   . Aortic valve disorder    Echo in 2009 showed mean aortic valve gradient of 13 mmHg, suggesting very mild stenosis. Echo (12/10) suggested aortic sclerosis only  . Carotid artery disease (HCC)    mild, carotid dopplers 12/2009  . CHF (congestive heart failure) ([place])   . COPD (chronic obstructive pulmonary disease) ([place])   . Coronary artery disease    s/p anterior MI in 1996 followed by CABG. Lexiscan myoview (4/11): EF 67%, normal perfusion with no evidence for ischemia or infarction.   . Diabetes mellitus    type 2  . Diastolic heart failure    most recent echo (12/10) showed EF 55-60% with mild LVH, grade I diastolic dysfunction, mild LAE.  [name] ESRD (end stage renal disease) on dialysis ([place])   . Hyperlipidemia   . Hypertension    resistant hyptertension times many years. the patient does have renal artery stenosis. she has tried calcium channel blockers in the past and states  that she would not take them now because she had some problems with her gums which her dentist identified as calcium-channel blocker side effects.  . Hypothyroidism   . Mild asthma    PFT 02/05/10 FEV1 1.38, FEV1% 73, TLC 3.78 (86%), DLCO 48%, +BD  . Myocardial infarction ([place])   . Obesity   . Obesity hypoventilation syndrome ([place])       . Obstructive sleep apnea    PSG 01/05/2006 AHI 24.8, CPAP 9cm H2O  . Pulmonary nodule, right   . Renal artery stenosis [facility])    The patient has an occluded right renal artery and an atrophic right kidney. There is 20% left renal artery stenosis. This was seen by catheterization in 2007  . Stroke [facility])     Past Surgical History:  Procedure Laterality Date  . A/V FISTULAGRAM N/A 01/25/2018   Procedure: A/V FISTULAGRAM;  Surgeon: Algernon Huxley, MD;  Location: [place] CV LAB;  Service: Cardiovascular;  Laterality: N/A;  . ABDOMINAL HYSTERECTOMY  1985  . AV FISTULA PLACEMENT    . CATARACT EXTRACTION  2010  . CORONARY ARTERY BYPASS GRAFT  1996  . DIALYSIS FISTULA CREATION    . [place]   right  . INTRAMEDULLARY (IM) NAIL INTERTROCHANTERIC Right 08/17/2018   Procedure: INTRAMEDULLARY (IM) NAIL INTERTROCHANTRIC;  Surgeon:  Earnestine Leys, MD;  Location: [facility] ORS;  Service: Orthopedics;  Laterality: Right;  . PERIPHERAL VASCULAR CATHETERIZATION Left 01/06/2015   Procedure: A/V Shuntogram/Fistulagram;  Surgeon: Katha Cabal, MD;  Location: [place] CV LAB;  Service: Cardiovascular;  Laterality: Left;  . PERIPHERAL VASCULAR CATHETERIZATION N/A 02/17/2015   Procedure: Dialysis/Perma Catheter Removal;  Surgeon: Katha Cabal, MD;  Location: [place] CV LAB;  Service: Cardiovascular;  Laterality: N/A;  . PERIPHERAL VASCULAR CATHETERIZATION N/A 01/28/2016   Procedure: A/V Shuntogram/Fistulagram;  Surgeon: Algernon Huxley, MD;  Location: [place] CV LAB;  Service: Cardiovascular;  Laterality: N/A;  . PERIPHERAL VASCULAR  CATHETERIZATION N/A 01/28/2016   Procedure: A/V Shunt Intervention;  Surgeon: Algernon Huxley, MD;  Location: [place] [facility] LAB;  Service: Cardiovascular;  Laterality: N/A;  . TUBAL LIGATION  1973    Social History   Socioeconomic History  . Marital status: Married    Spouse name: Not on file  . Number of children: Not on file  . Years of education: Not on file  . Highest education level: Not on file  Occupational History  . Not on file  Social Needs  . Financial resource strain: Not on file  . Food insecurity:    Worry: Not on file    Inability: Not on file  . Transportation needs:    Medical: Not on file    Non-medical: Not on file  Tobacco Use  . Smoking status: Never Smoker  . Smokeless tobacco: Never Used  Substance and Sexual Activity  . Alcohol use: No  . Drug use: No  . Sexual activity: Not on file  Lifestyle  . Physical activity:    Days per week: Not on file    Minutes per session: Not on file  . Stress: Not on file  Relationships  . Social connections:    Talks on phone: Not on file    Gets together: Not on file    Attends religious service: Not on file    Active member of club or organization: Not on file    Attends meetings of clubs or organizations: Not on file    Relationship status: Not on file  . Intimate partner violence:    Fear of current or ex partner: Not on file    Emotionally abused: Not on file    Physically abused: Not on file    Forced sexual activity: Not on file  Other Topics Concern  . Not on file  Social History Narrative  . Not on file   Family History  Problem Relation Age of Onset  . Kidney failure Mother   . Diabetes Mother   . Aortic aneurysm Father   . Coronary artery disease Father   . Heart attack Father   . Diabetes Sister   . Diabetes Brother   . Kidney failure Brother       VITAL SIGNS BP (!) 151/49   Pulse 71   Temp 98.3 F (36.8 C)   Resp 18   Ht 5\' 3"  (1.6 m)   Wt 190 lb 14.4 oz (86.6 kg)   LMP  02/01/1986 (Approximate)   SpO2 99%   BMI 33.82 kg/m   Outpatient Encounter Medications as of 09/20/2018  Medication Sig  . acetaminophen (TYLENOL) 325 MG tablet Take 2 tablets (650 mg total) by mouth every 6 (six) hours as needed for mild pain, fever or headache (or Fever >/= 101).  [name] albuterol (PROVENTIL HFA;VENTOLIN HFA) 108 (90 Base) MCG/ACT inhaler Inhale  2 puffs into the lungs every 6 (six) hours as needed for wheezing or shortness of breath.  . Amino Acids-Protein Hydrolys (FEEDING SUPPLEMENT, PRO-STAT SUGAR FREE 64,) LIQD Take 30 mLs by mouth 2 (two) times daily.  [name] b complex-vitamin c-folic acid (NEPHRO-VITE) 0.8 MG TABS tablet Take 1 tablet by mouth daily.  . carvedilol (COREG) 25 MG tablet Take 25 mg by mouth 2 (two) times daily.   . Cholecalciferol (VITAMIN D3) 5000 UNITS TABS Take 5,000 Units by mouth daily.  . ferrous fumarate-iron polysaccharide complex (TANDEM) 162-115.2 MG CAPS capsule Take 1 capsule by mouth daily.  [name] FLUoxetine (PROZAC) 20 MG tablet Take 20 mg by mouth daily.   . Fluticasone-Salmeterol (ADVAIR) 100-50 MCG/DOSE AEPB Inhale 1 puff into the lungs 2 (two) times daily.  . furosemide (LASIX) 20 MG tablet Give 1 tablet by mouth once daily on Sunday  . furosemide (LASIX) 80 MG tablet Give 1 tablet by mouth daily on Sunday, Tuesday and Saturday  . hydrALAZINE (APRESOLINE) 50 MG tablet Take 50 mg by mouth 2 (two) times daily.   [name] HYDROcodone-acetaminophen (NORCO/VICODIN) 5-325 MG tablet Take 1 tablet by mouth every 6 (six) hours as needed for severe pain.  . Infant Care Products [facility] EX) Apply liberal amount topically to area of skin irritation as needed.  OK to leave at bedside  . ipratropium (ATROVENT) 0.02 % nebulizer solution Take 0.5 mg by nebulization every 6 (six) hours as needed for wheezing or shortness of breath.  . isosorbide mononitrate (IMDUR) 30 MG 24 hr tablet Take 30 mg by mouth at bedtime.   [name] levothyroxine (SYNTHROID, LEVOTHROID) 75 MCG tablet  Take 75 mcg by mouth daily.   [name] lidocaine-prilocaine (EMLA) cream Apply 1 application topically as needed (for pain).  . methocarbamol (ROBAXIN) 750 MG tablet Take 750 mg by mouth every 8 (eight) hours. muscle spasms and pain management  . midodrine (PROAMATINE) 5 MG tablet Take 1 tablet (5 mg total) by mouth 2 (two) times daily with a meal.  . NON FORMULARY Diet Type: Downgrade pt to Dysphagia 3 consistencies, continue thin liquids.  renal, NAS, NCS, 1200 ml fluid restriction  . nystatin (MYCOSTATIN/NYSTOP) 100000 UNIT/GM POWD Apply 1 g topically as needed (for irritation).   . OXYGEN Inhale 2 L/min into the lungs continuous.  . potassium chloride SA (K-DUR,KLOR-CON) 20 MEQ tablet Take 20 mEq by mouth daily.  . rosuvastatin (CRESTOR) 40 MG tablet Take 40 mg by mouth every evening.   Orlie Dakin Sodium (SENNA PLUS) 8.6-50 MG CAPS Take 2 capsules by mouth 2 (two) times daily.  [name] telmisartan (MICARDIS) 80 MG tablet Take 80 mg by mouth daily. At night  . traMADol (ULTRAM) 50 MG tablet Take 1 tablet (50 mg total) by mouth every 6 (six) hours as needed for moderate pain or severe pain.  . traZODone (DESYREL) 100 MG tablet Take 100 mg by mouth at bedtime.   . triamcinolone (KENALOG) 0.5 % cream Apply 1 application topically as needed (for irritation).   [name] UNABLE TO FIND CPAP @@ Bedtime  . [DISCONTINUED] Nutritional Supplements (PROMOD) LIQD Take 30 mLs by mouth 2 (two) times daily. protein calorie malnutrition albumin 2.6   No facility-administered encounter medications on file as of 09/20/2018.      SIGNIFICANT DIAGNOSTIC EXAMS  PREVIOUS  08-11-18: chest x-ray: Mild central venous congestion. Midline sternotomy.  08-11-18; right hip and pelvic x-ray: Acute intertrochanteric RIGHT femur fracture  09-01-18: chest x-ray: Mild central pulmonary vascular congestion. No pulmonary edema or  pleural effusion.  NO NEW EXAMS.   LABS REVIEWED PREVIOUS:   12-26-17: chol 128; ldl 46; trig 115;  hdl 59  07-03-18: tsh 3.48 08-11-18: wbc 10.2; hgb 9.8; hct 29.3; mcv 107.3; plt 90 glucose 147; ldl 16; creat 2.20; k+ 3.6; na++ 133; ca 8.1; liver normal albumin 3.3 folate 31.0;  Vit B 12: 4.9 phos 2.7; mag 1.8 pre-albumin 18.8ca ionized 4.6  08-13-18: glucose 132; bun 37; creat 4.39; k+ 3.8; na++ 132; ca 8.3 PTH 59 08-15-18: wbc 8.7; hgb 7.1; hct 20.7'; mcv 106.7; plt 128 glucose 132; bun 37; creat 3.31; k+ 3.5; na++ 132; ca 8.4 albumin 2.6  08-20-18: wbc 12.1;hgb 7.6; hct 22.8; mcv 100.4; plt 151 08-21-18: wbc 9.9; hgb 9.4; hct 28.4; mcv 97.3; plt 141; glucose 110; bun 22; creat 2.04; k+ 3.4; na++ 134; ca 8.1  08-25-18: wbc 8.0; hgb 9.6; hct 29.4; mcv 101.4; plt 130 glucose 144; bun 28; creat 2.55; k+ 3.2; na++ 130; ca 8.3 liver normal albumin 3.1  08-31-18: glucose 131; bun 23; creat 2.14; k+ 3.6; na++ 131; ca 8.1  09-01-18: wbc 7.0; hgb 9.9; hct 30.7; mcv 101.3; plt 160; glucose 124; bun 18; creat 2.20; k+ 37; na++ 132; ca 8.5; liver normal albumin 3.2  NO NEW LABS.    Review of Systems  Constitutional: Negative for malaise/fatigue.  Respiratory: Negative for cough and shortness of breath.   Cardiovascular: Negative for chest pain, palpitations and leg swelling.  Gastrointestinal: Negative for abdominal pain, constipation and heartburn.  Musculoskeletal: Negative for back pain, joint pain and myalgias.  Skin: Negative.   Neurological: Negative for dizziness.  Psychiatric/Behavioral: The patient is not nervous/anxious.      Physical Exam Constitutional:      General: She is not in acute distress.    Appearance: She is well-developed. She is obese. She is not diaphoretic.  Neck:     Thyroid: No thyromegaly.  Cardiovascular:     Rate and Rhythm: Normal rate and regular rhythm.     Pulses: Normal pulses.     Heart sounds: Murmur present.     Comments: 1/6 Pulmonary:     Effort: Pulmonary effort is normal. No respiratory distress.     Breath sounds: Normal breath sounds.      Comments:  02 dependent Uses CPAP at night  Abdominal:     General: Bowel sounds are normal. There is no distension.     Palpations: Abdomen is soft.     Tenderness: There is no abdominal tenderness.  Musculoskeletal:     Right lower leg: Edema present.     Left lower leg: No edema.     Comments: Right leg edema Is able to move all extremities  Status post right hip fracture.    Lymphadenopathy:     Cervical: No cervical adenopathy.  Skin:    General: Skin is warm and dry.     Comments:  Left upper extremity A/V fistula  + thrill/+bruit      Neurological:     Mental Status: She is alert and oriented to person, place, and time.  Psychiatric:        Mood and Affect: Mood normal.     ASSESSMENT/ PLAN:  TODAY;   1. Chronic kidney disease with end stage renal disease on dialysis due to type 2 diabetes mellitus:  2.  intertrochanteric RIGHT femur fracture:  3.  COPD with asthma:   Will continue her current plan of care Will follow up with orthopedics on 10-02-18 The  goal of her care at this time is for long term SNF.    MD is aware of resident's narcotic use and is in agreement with current plan of care. We will attempt to wean resident as apropriate   Ok Edwards NP [facility]  Contact [phone] Monday through Friday 8am- 5pm  After hours call [phone]

## 2018-09-21 ENCOUNTER — Non-Acute Institutional Stay: Payer: Medicare Other | Admitting: Nurse Practitioner

## 2018-09-21 ENCOUNTER — Encounter: Payer: Self-pay | Admitting: Nurse Practitioner

## 2018-09-21 VITALS — HR 88 | Resp 18

## 2018-09-21 DIAGNOSIS — R52 Pain, unspecified: Secondary | ICD-10-CM

## 2018-09-21 DIAGNOSIS — R0602 Shortness of breath: Secondary | ICD-10-CM

## 2018-09-21 DIAGNOSIS — Z515 Encounter for palliative care: Secondary | ICD-10-CM

## 2018-09-21 NOTE — Progress Notes (Signed)
[facility] Telephone: [phone] Fax: [phone]  PATIENT NAME: Dawn Bradford DOB: 03-Jul-1936 MRN: [id number]  PRIMARY CARE PROVIDER:   Hortencia Pilar, MD  REFERRING PROVIDER:  Dr Lovell [facility] RESPONSIBLE PARTY:   daughter [name] at [phone], daughter [name] [place] at [phone], or husband Anahlia Iseminger at [phone]    ASSESSMENT:     I visit and observed Dawn Bradford. We talked about purpose for palliative medicine visit. Dawn Bradford an agreement. We talked about how she was feeling today. We talked about symptoms of pain and shortness of breath when she denies. She became anxious, tearful during palliative care visit. Dawn Bradford endorses that she's very tired and worn out. She needs to get ready to go to Dialysis in about an hour. She talked about feeling weak and fatigued. She was carpeted with assessment. Asked Dawn Bradford if it was okay to meet with her daughter's separately and she was an agreement for further discussion of medical goals of care. Emotional support provided.  I met with Dawn Bradford, Dawn Bradford separately. We talked about purpose for palliative care visit. We review the last time Dawn Bradford was independent at home prior to dialysis which was about four years ago. She did stop driving about 6 years ago on her own. We talked about past medical history in the setting of chronic disease with end-stage renal failure requiring hemodialysis to sustain life. We talked about hemodialysis being an artificial means to keep someone alive. We talked about history of congestive heart failure, late onset stroke and now hip fracture with repair. We talked about recent hospitalization with complications and transition to short-term rehab. We talked about symptoms, appetite. We talked about her ability to perform rehab which is very minimal. She continues to be non-weight-bearing although she does have an appointment with orthopedic next week for repeat x-ray in hopes to  become weight-bearing. Dawn Bradford endorses they filled an appeal with insurance to continue short-term rehab. We talked about realistically what she is able to do functionally. We talked about Dawn Bradford verbalizing that she's very tired. We at great length about realistic expectations for her recovery. We talked about progression of aging in the city of chronic disease. We talked about Dawn Bradford not walking again even once she gets her order to be weight-bearing. We talked about symptoms of anxiety when she feels overwhelmed and begins to cry. We talked about coping strategies. We talked about options once short-term rehab is completed. We talked about hiring private physical therapist to continue should Insurance stop. We talked about transition to long-term care versus transition home with Home Health, in-home palliative care. We talked about her requirement of 24 hour caregivers. We talked about option of live-in caregiver. Dawn Bradford talked about her father's health and that they actually have a caregiver hired to care for him. They talked about cost of long-term care in [place] versus assisted living facility versus paying caregivers at home. This was a very lengthy discussion amongst the daughters with their concerns and abilities to help. We talked about realistic expectations. List given concerning private caregivers agencies, skilled nursing facilities. We talked about navigating both. We talked about pending appeal and hopes that it will be approved and she will be able to be weight-bearing once cleared by Orthopedic at appointment. Hope is for her to be able to continue short-term rehab although if that's not the case daughters will need to further discuss discharge plan. We talked about expectations in the city  of chronic disease. We talked about Dawn Bradford's verbalization in the past of being tired of dialysis. We talked about stopping dialysis though at this time daughters did not want to  discuss that option. Dawn Bradford wants to continue dialysis. We did talk about what it looks like when somebody does stop dialysis. We talked about what hospice screening and care looks like. Daughters talked multiple times among themselves about possibility of plans. Wishes are to stay at [facility] for long-term care until further decide if going home is an option. Currently she remains on short-term rehab and will wait until appeal completed. Talked about role of palliative medicine and plan of care. She does remain a full code. Therapeutic listening and emotional support provided. This was an extremely lengthy family meeting due to multiple concerns by the daughters and need to understand chronic disease process with options for treatment and discharge planning. Questions answered to satisfaction. Contact information provided. Will follow up in 2 weeks if needed or sooner should she decline. I updated staff.   RECOMMENDATIONS and PLAN:  1. Palliative care encounter Z51.5; Palliative medicine team will continue to support patient, patient's family, and medical team. Visit consisted of counseling and education dealing with the complex and emotionally intense issues of symptom management and palliative care in the setting of serious and potentially life-threatening illness  2. Pain R52.0 secondary to Hip fx s/p sgy; will continue to monitor on pain scale, monitor efficacy vs adverse side effects. Continue with current pain regimen with Robaxin 750 mg tid, my Ultram 50 mg 6 hours as needed for pain and Vicodin 5 / 325 mg 6 hours as needed for pain.  3. Dyspneic R06.00 secondary to COPD/congestive heart failure remain stable at present time. Continue with continuous O2/inhalation therapy; CPAP qhs  I spent 120 minutes providing this consultation,  from 9:30am to 11:30. More than 50% of the time in this consultation was spent coordinating communication.   HISTORY OF PRESENT ILLNESS:  [name] is a 83  y.o. year old female with multiple medical problems including End-stage renal disease on dialysis Monday, Wednesday, Friday), diastolic heart failure, coronary artery disease s/p cabg, myocardial infarction,aortic valve disorder, COPD, congestive heart failure, carotid artery disease , CVA, right artery stenosis with occluded right renal artery, right pulmonary nodule, obstructive sleep apnea, obesity hypoventilation syndrome, hypertension, hyperlipidemia, diabetes, hypothyroidism, asthma, cataract extraction, abdominal hysterectomy. Hospitalized 12 / 6 / 2019 to 12 / 17 / 2019 after a fall with right hip fracture, right intertrochanteric femoral neck fracture s/p sgy 08/17/2018. Hospitalization Complicated by anemia of chronic disease requiring transfusion with dialysis. Obstructive sleep apnea use a CPAP at night. Discharged short-term rehab at [facility]. Last primary provider visit 12/31 / 2019 following a visit to the emergency department for COPD exacerbation. She continued on 2 liters of oxygen continuous, and Elation therapy with albuterol, Advair and added duonebs Q 6 hours. Anemia was stable hemoglobin 9.4 on Epi with dialysis and continued tandem. She continued on hemodialysis with nephrovite daily with a 1200 cc fluid restriction Follow by nephrology. Depression she continued on Prozac with trazodone nightly. She has been receiving for pain secondary to intertrochanteric femur fracture Robaxin 750 mg tid, my Ultram 50 mg 6 hours as needed for pain and Vicodin 5 / 325 mg 6 hours as needed for pain. Protein calorie malnutrition severe without change in albumin 3.2 continue on prostat bid. Diastolic congestive heart failure chronic remains on diuretic, Coreg. She was discharged to short-term  rehab where she's currently remains. Dawn Hopple continues to reside at skilled nursing facility for short-term rehab. She is a total care, lift by Harrel Lemon to the wheelchair where she is able to sit up. She is ADL  dependent. She is able to feed herself with help after tray setup. Appetite does remain declined / staff. She does continue to go to dialysis on Monday, Wednesday, Friday and tolerating treatments. She continues to work with therapy with bed exercises and going to the gym on machines. She is very limited on her functional ability. Staff endorses that she does cry often with tears, anxiety feeling overwhelmed. At present she is lying in bed, appears chronically ill debilitated on continuous oxygen. Her three daughters are with her. Palliative Care was asked to help address goals of care.   CODE STATUS: Full  PPS: 40% HOSPICE ELIGIBILITY/DIAGNOSIS: TBD  PAST MEDICAL HISTORY:  Past Medical History:  Diagnosis Date  . ACE-inhibitor cough   . Aortic valve disorder    Echo in 2009 showed mean aortic valve gradient of 13 mmHg, suggesting very mild stenosis. Echo (12/10) suggested aortic sclerosis only  . Carotid artery disease (HCC)    mild, carotid dopplers 12/2009  . CHF (congestive heart failure) ([place])   . COPD (chronic obstructive pulmonary disease) ([place])   . Coronary artery disease    s/p anterior MI in 1996 followed by CABG. Lexiscan myoview (4/11): EF 67%, normal perfusion with no evidence for ischemia or infarction.   . Diabetes mellitus    type 2  . Diastolic heart failure    most recent echo (12/10) showed EF 55-60% with mild LVH, grade I diastolic dysfunction, mild LAE.  [name] ESRD (end stage renal disease) on dialysis ([place])   . Hyperlipidemia   . Hypertension    resistant hyptertension times many years. the patient does have renal artery stenosis. she has tried calcium channel blockers in the past and states that she would not take them now because she had some problems with her gums which her dentist identified as calcium-channel blocker side effects.  . Hypothyroidism   . Mild asthma    PFT 02/05/10 FEV1 1.38, FEV1% 73, TLC 3.78 (86%), DLCO 48%, +BD  . Myocardial infarction ([place])   .  Obesity   . Obesity hypoventilation syndrome ([place])       . Obstructive sleep apnea    PSG 01/05/2006 AHI 24.8, CPAP 9cm H2O  . Pulmonary nodule, right   . Renal artery stenosis [facility])    The patient has an occluded right renal artery and an atrophic right kidney. There is 20% left renal artery stenosis. This was seen by catheterization in 2007  . Stroke [facility])     SOCIAL HX:  Social History   Tobacco Use  . Smoking status: Never Smoker  . Smokeless tobacco: Never Used  Substance Use Topics  . Alcohol use: No    ALLERGIES:  Allergies  Allergen Reactions  . Ace Inhibitors Other (See Comments)    Reaction:  Unknown   . Amlodipine Other (See Comments)    Reaction:  Unknown   . Clonidine Hydrochloride Other (See Comments)    Reaction:  Unknown   . Codeine Hives  . Latex Hives  . Metformin Nausea And Vomiting  . Tape Rash     PERTINENT MEDICATIONS:  Outpatient Encounter Medications as of 09/21/2018  Medication Sig  . acetaminophen (TYLENOL) 325 MG tablet Take 2 tablets (650 mg total) by mouth every 6 (six) hours as needed  for mild pain, fever or headache (or Fever >/= 101).  [name] albuterol (PROVENTIL HFA;VENTOLIN HFA) 108 (90 Base) MCG/ACT inhaler Inhale 2 puffs into the lungs every 6 (six) hours as needed for wheezing or shortness of breath.  . Amino Acids-Protein Hydrolys (FEEDING SUPPLEMENT, PRO-STAT SUGAR FREE 64,) LIQD Take 30 mLs by mouth 2 (two) times daily.  [name] b complex-vitamin c-folic acid (NEPHRO-VITE) 0.8 MG TABS tablet Take 1 tablet by mouth daily.  . carvedilol (COREG) 25 MG tablet Take 25 mg by mouth 2 (two) times daily.   . Cholecalciferol (VITAMIN D3) 5000 UNITS TABS Take 5,000 Units by mouth daily.  . ferrous fumarate-iron polysaccharide complex (TANDEM) 162-115.2 MG CAPS capsule Take 1 capsule by mouth daily.  [name] FLUoxetine (PROZAC) 20 MG tablet Take 20 mg by mouth daily.   . Fluticasone-Salmeterol (ADVAIR) 100-50 MCG/DOSE AEPB Inhale 1 puff into the lungs 2 (two)  times daily.  . furosemide (LASIX) 20 MG tablet Give 1 tablet by mouth once daily on Sunday  . furosemide (LASIX) 80 MG tablet Give 1 tablet by mouth daily on Sunday, Tuesday and Saturday  . hydrALAZINE (APRESOLINE) 50 MG tablet Take 50 mg by mouth 2 (two) times daily.   [name] HYDROcodone-acetaminophen (NORCO/VICODIN) 5-325 MG tablet Take 1 tablet by mouth every 6 (six) hours as needed for severe pain.  . Infant [employer] [facility]) Apply liberal amount topically to area of skin irritation as needed.  OK to leave at bedside  . ipratropium (ATROVENT) 0.02 % nebulizer solution Take 0.5 mg by nebulization every 6 (six) hours as needed for wheezing or shortness of breath.  . isosorbide mononitrate (IMDUR) 30 MG 24 hr tablet Take 30 mg by mouth at bedtime.   [name] levothyroxine (SYNTHROID, LEVOTHROID) 75 MCG tablet Take 75 mcg by mouth daily.   [name] lidocaine-prilocaine (EMLA) cream Apply 1 application topically as needed (for pain).  . methocarbamol (ROBAXIN) 750 MG tablet Take 750 mg by mouth every 8 (eight) hours. muscle spasms and pain management  . midodrine (PROAMATINE) 5 MG tablet Take 1 tablet (5 mg total) by mouth 2 (two) times daily with a meal.  . NON FORMULARY Diet Type: Downgrade pt to Dysphagia 3 consistencies, continue thin liquids.  renal, NAS, NCS, 1200 ml fluid restriction  . nystatin (MYCOSTATIN/NYSTOP) 100000 UNIT/GM POWD Apply 1 g topically as needed (for irritation).   . OXYGEN Inhale 2 L/min into the lungs continuous.  . potassium chloride SA (K-DUR,KLOR-CON) 20 MEQ tablet Take 20 mEq by mouth daily.  . rosuvastatin (CRESTOR) 40 MG tablet Take 40 mg by mouth every evening.   Orlie Dakin Sodium (SENNA PLUS) 8.6-50 MG CAPS Take 2 capsules by mouth 2 (two) times daily.  [name] telmisartan (MICARDIS) 80 MG tablet Take 80 mg by mouth daily. At night  . traMADol (ULTRAM) 50 MG tablet Take 1 tablet (50 mg total) by mouth every 6 (six) hours as needed for moderate pain or severe pain.   . traZODone (DESYREL) 100 MG tablet Take 100 mg by mouth at bedtime.   . triamcinolone (KENALOG) 0.5 % cream Apply 1 application topically as needed (for irritation).   [name] UNABLE TO FIND CPAP @@ Bedtime   No facility-administered encounter medications on file as of 09/21/2018.     PHYSICAL EXAM:   General: obese, chronically ill female Cardiovascular: regular rate and rhythm Pulmonary: clear ant fields Abdomen: soft, nontender, + bowel sounds GU: no suprapubic tenderness Extremities: no edema, no joint deformities Skin: no rashes Neurological: Weakness  but otherwise nonfocal/functionally quadriplegic  Christin Ihor Gully, NP

## 2018-09-25 ENCOUNTER — Encounter: Payer: Self-pay | Admitting: Adult Health

## 2018-09-25 ENCOUNTER — Non-Acute Institutional Stay (SKILLED_NURSING_FACILITY): Payer: Medicare Other | Admitting: Adult Health

## 2018-09-25 DIAGNOSIS — K5909 Other constipation: Secondary | ICD-10-CM | POA: Diagnosis not present

## 2018-09-25 DIAGNOSIS — S72141S Displaced intertrochanteric fracture of right femur, sequela: Secondary | ICD-10-CM | POA: Diagnosis not present

## 2018-09-25 DIAGNOSIS — E43 Unspecified severe protein-calorie malnutrition: Secondary | ICD-10-CM | POA: Diagnosis not present

## 2018-09-25 DIAGNOSIS — I25119 Atherosclerotic heart disease of native coronary artery with unspecified angina pectoris: Secondary | ICD-10-CM | POA: Diagnosis not present

## 2018-09-25 NOTE — Progress Notes (Signed)
Location:   [facility] Room Number: [place] of Service:  SNF (31)   CODE STATUS: Full Code  Allergies  Allergen Reactions  . Ace Inhibitors Other (See Comments)    Reaction:  Unknown   . Amlodipine Other (See Comments)    Reaction:  Unknown   . Clonidine Hydrochloride Other (See Comments)    Reaction:  Unknown   . Codeine Hives  . Latex Hives  . Metformin Nausea And Vomiting  . Tape Rash    Chief Complaint  Patient presents with  . Medical Management of Chronic Issues    Coronary artery disease involving native coronary artery of native heart with angina pectoris; closed comminuted intertrochanteric fracture of right femur sequela; chronic constipation; protein calorie malnutrition severe.     HPI:  She is a 83 year old long term resident of this facility being seen for the management of her chronic illnesses; cad; right femur fracture; malnutrition. She denies any constipation; no uncontrolled right hip pain no changes in appetite.   Past Medical History:  Diagnosis Date  . ACE-inhibitor cough   . Aortic valve disorder    Echo in 2009 showed mean aortic valve gradient of 13 mmHg, suggesting very mild stenosis. Echo (12/10) suggested aortic sclerosis only  . Carotid artery disease (HCC)    mild, carotid dopplers 12/2009  . CHF (congestive heart failure) ([place])   . COPD (chronic obstructive pulmonary disease) ([place])   . Coronary artery disease    s/p anterior MI in 1996 followed by CABG. Lexiscan myoview (4/11): EF 67%, normal perfusion with no evidence for ischemia or infarction.   . Diabetes mellitus    type 2  . Diastolic heart failure    most recent echo (12/10) showed EF 55-60% with mild LVH, grade I diastolic dysfunction, mild LAE.  [name] ESRD (end stage renal disease) on dialysis ([place])   . Hyperlipidemia   . Hypertension    resistant hyptertension times many years. the patient does have renal artery stenosis. she has tried calcium  channel blockers in the past and states that she would not take them now because she had some problems with her gums which her dentist identified as calcium-channel blocker side effects.  . Hypothyroidism   . Mild asthma    PFT 02/05/10 FEV1 1.38, FEV1% 73, TLC 3.78 (86%), DLCO 48%, +BD  . Myocardial infarction ([place])   . Obesity   . Obesity hypoventilation syndrome ([place])       . Obstructive sleep apnea    PSG 01/05/2006 AHI 24.8, CPAP 9cm H2O  . Pulmonary nodule, right   . Renal artery stenosis [facility])    The patient has an occluded right renal artery and an atrophic right kidney. There is 20% left renal artery stenosis. This was seen by catheterization in 2007  . Stroke [facility])     Past Surgical History:  Procedure Laterality Date  . A/V FISTULAGRAM N/A 01/25/2018   Procedure: A/V FISTULAGRAM;  Surgeon: Algernon Huxley, MD;  Location: [place] [facility];  Service: Cardiovascular;  Laterality: N/A;  . ABDOMINAL HYSTERECTOMY  1985  . AV FISTULA PLACEMENT    . CATARACT EXTRACTION  2010  . CORONARY ARTERY BYPASS GRAFT  1996  . DIALYSIS FISTULA CREATION    . [place]   right  . INTRAMEDULLARY (IM) NAIL INTERTROCHANTERIC Right 08/17/2018   Procedure: INTRAMEDULLARY (IM) NAIL INTERTROCHANTRIC;  Surgeon: Earnestine Leys, MD;  Location: [facility] ORS;  Service:  Orthopedics;  Laterality: Right;  . PERIPHERAL VASCULAR CATHETERIZATION Left 01/06/2015   Procedure: A/V Shuntogram/Fistulagram;  Surgeon: Katha Cabal, MD;  Location: [place] [facility];  Service: Cardiovascular;  Laterality: Left;  . PERIPHERAL VASCULAR CATHETERIZATION N/A 02/17/2015   Procedure: Dialysis/Perma Catheter Removal;  Surgeon: Katha Cabal, MD;  Location: [place] [facility];  Service: Cardiovascular;  Laterality: N/A;  . PERIPHERAL VASCULAR CATHETERIZATION N/A 01/28/2016   Procedure: A/V Shuntogram/Fistulagram;  Surgeon: Algernon Huxley, MD;  Location: [place] [facility];  Service: Cardiovascular;  Laterality:  N/A;  . PERIPHERAL VASCULAR CATHETERIZATION N/A 01/28/2016   Procedure: A/V Shunt Intervention;  Surgeon: Algernon Huxley, MD;  Location: [place] [facility];  Service: Cardiovascular;  Laterality: N/A;  . TUBAL LIGATION  1973    Social History   Socioeconomic History  . Marital status: Married    Spouse name: Not on file  . Number of children: Not on file  . Years of education: Not on file  . Highest education level: Not on file  Occupational History  . Not on file  Social Needs  . Financial resource strain: Not on file  . Food insecurity:    Worry: Not on file    Inability: Not on file  . Transportation needs:    Medical: Not on file    Non-medical: Not on file  Tobacco Use  . Smoking status: Never Smoker  . Smokeless tobacco: Never Used  Substance and Sexual Activity  . Alcohol use: No  . Drug use: No  . Sexual activity: Not on file  Lifestyle  . Physical activity:    Days per week: Not on file    Minutes per session: Not on file  . Stress: Not on file  Relationships  . Social connections:    Talks on phone: Not on file    Gets together: Not on file    Attends religious service: Not on file    Active member of club or organization: Not on file    Attends meetings of clubs or organizations: Not on file    Relationship status: Not on file  . Intimate partner violence:    Fear of current or ex partner: Not on file    Emotionally abused: Not on file    Physically abused: Not on file    Forced sexual activity: Not on file  Other Topics Concern  . Not on file  Social History Narrative  . Not on file   Family History  Problem Relation Age of Onset  . Kidney failure Mother   . Diabetes Mother   . Aortic aneurysm Father   . Coronary artery disease Father   . Heart attack Father   . Diabetes Sister   . Diabetes Brother   . Kidney failure Brother       VITAL SIGNS BP (!) 130/51   Pulse 79   Temp 99 F (37.2 C)   Resp 20   Ht 5\' 3"  (1.6 m)   Wt 205 lb 1.6  oz (93 kg)   LMP 02/01/1986 (Approximate)   SpO2 98%   BMI 36.33 kg/m   Outpatient Encounter Medications as of 09/25/2018  Medication Sig  . acetaminophen (TYLENOL) 325 MG tablet Take 2 tablets (650 mg total) by mouth every 6 (six) hours as needed for mild pain, fever or headache (or Fever >/= 101).  [name] albuterol (PROVENTIL HFA;VENTOLIN HFA) 108 (90 Base) MCG/ACT inhaler Inhale 2 puffs into the lungs every 6 (six) hours  as needed for wheezing or shortness of breath.  . Amino Acids-Protein Hydrolys (FEEDING SUPPLEMENT, PRO-STAT SUGAR FREE 64,) LIQD Take 30 mLs by mouth 2 (two) times daily.  [name] b complex-vitamin c-folic acid (NEPHRO-VITE) 0.8 MG TABS tablet Take 1 tablet by mouth daily.  . carvedilol (COREG) 25 MG tablet Take 25 mg by mouth 2 (two) times daily.   . Cholecalciferol (VITAMIN D3) 5000 UNITS TABS Take 5,000 Units by mouth daily.  . ferrous fumarate-iron polysaccharide complex (TANDEM) 162-115.2 MG CAPS capsule Take 1 capsule by mouth daily.  [name] FLUoxetine (PROZAC) 20 MG tablet Take 20 mg by mouth daily.   . Fluticasone-Salmeterol (ADVAIR) 100-50 MCG/DOSE AEPB Inhale 1 puff into the lungs 2 (two) times daily.  . furosemide (LASIX) 20 MG tablet Give 1 tablet by mouth once daily on Sunday  . furosemide (LASIX) 80 MG tablet Give 1 tablet by mouth daily on Sunday, Tuesday and Saturday  . hydrALAZINE (APRESOLINE) 50 MG tablet Take 50 mg by mouth 2 (two) times daily.   [name] HYDROcodone-acetaminophen (NORCO/VICODIN) 5-325 MG tablet Take 1 tablet by mouth every 6 (six) hours as needed for severe pain.  . Infant Care Products [facility] EX) Apply liberal amount topically to area of skin irritation as needed.  OK to leave at bedside  . ipratropium (ATROVENT) 0.02 % nebulizer solution Take 0.5 mg by nebulization every 6 (six) hours as needed for wheezing or shortness of breath.  . isosorbide mononitrate (IMDUR) 30 MG 24 hr tablet Take 30 mg by mouth at bedtime.   [name] levothyroxine (SYNTHROID,  LEVOTHROID) 75 MCG tablet Take 75 mcg by mouth daily.   [name] lidocaine-prilocaine (EMLA) cream Apply 1 application topically as needed (for pain).  . methocarbamol (ROBAXIN) 750 MG tablet Take 750 mg by mouth every 8 (eight) hours. muscle spasms and pain management  . midodrine (PROAMATINE) 5 MG tablet Take 1 tablet (5 mg total) by mouth 2 (two) times daily with a meal.  . NON FORMULARY Diet Type: Downgrade pt to Dysphagia 3 consistencies, continue thin liquids.  renal, NAS, NCS, 1200 ml fluid restriction  . nystatin (MYCOSTATIN/NYSTOP) 100000 UNIT/GM POWD Apply 1 g topically as needed (for irritation).   . OXYGEN Inhale 2 L/min into the lungs continuous.  . potassium chloride SA (K-DUR,KLOR-CON) 20 MEQ tablet Take 20 mEq by mouth daily.  . rosuvastatin (CRESTOR) 40 MG tablet Take 40 mg by mouth every evening.   Orlie Dakin Sodium (SENNA PLUS) 8.6-50 MG CAPS Take 2 capsules by mouth 2 (two) times daily.  [name] telmisartan (MICARDIS) 80 MG tablet Take 80 mg by mouth daily. At night  . traMADol (ULTRAM) 50 MG tablet Take 1 tablet (50 mg total) by mouth every 6 (six) hours as needed for moderate pain or severe pain.  . traZODone (DESYREL) 100 MG tablet Take 100 mg by mouth at bedtime.   . triamcinolone (KENALOG) 0.5 % cream Apply 1 application topically as needed (for irritation).   [name] UNABLE TO FIND CPAP @@ Bedtime   No facility-administered encounter medications on file as of 09/25/2018.      SIGNIFICANT DIAGNOSTIC EXAMS   PREVIOUS  08-11-18: chest x-ray: Mild central venous congestion. Midline sternotomy.  08-11-18; right hip and pelvic x-ray: Acute intertrochanteric RIGHT femur fracture  09-01-18: chest x-ray: Mild central pulmonary vascular congestion. No pulmonary edema or pleural effusion.  NO NEW EXAMS.   LABS REVIEWED PREVIOUS:   12-26-17: chol 128; ldl 46; trig 115; hdl 59  07-03-18: tsh 3.48 08-11-18: wbc 10.2;  hgb 9.8; hct 29.3; mcv 107.3; plt 90 glucose 147; ldl 16; creat  2.20; k+ 3.6; na++ 133; ca 8.1; liver normal albumin 3.3 folate 31.0;  Vit B 12: 4.9 phos 2.7; mag 1.8 pre-albumin 18.8ca ionized 4.6  08-13-18: glucose 132; bun 37; creat 4.39; k+ 3.8; na++ 132; ca 8.3 PTH 59 08-15-18: wbc 8.7; hgb 7.1; hct 20.7'; mcv 106.7; plt 128 glucose 132; bun 37; creat 3.31; k+ 3.5; na++ 132; ca 8.4 albumin 2.6  08-20-18: wbc 12.1;hgb 7.6; hct 22.8; mcv 100.4; plt 151 08-21-18: wbc 9.9; hgb 9.4; hct 28.4; mcv 97.3; plt 141; glucose 110; bun 22; creat 2.04; k+ 3.4; na++ 134; ca 8.1  08-25-18: wbc 8.0; hgb 9.6; hct 29.4; mcv 101.4; plt 130 glucose 144; bun 28; creat 2.55; k+ 3.2; na++ 130; ca 8.3 liver normal albumin 3.1  08-31-18: glucose 131; bun 23; creat 2.14; k+ 3.6; na++ 131; ca 8.1  09-01-18: wbc 7.0; hgb 9.9; hct 30.7; mcv 101.3; plt 160; glucose 124; bun 18; creat 2.20; k+ 37; na++ 132; ca 8.5; liver normal albumin 3.2  NO NEW LABS.    Review of Systems  Constitutional: Negative for malaise/fatigue.  Respiratory: Negative for cough and shortness of breath.   Cardiovascular: Negative for chest pain, palpitations and leg swelling.  Gastrointestinal: Negative for abdominal pain, constipation and heartburn.  Musculoskeletal: Negative for back pain, joint pain and myalgias.  Skin: Negative.   Neurological: Negative for dizziness.  Psychiatric/Behavioral: The patient is not nervous/anxious.      Physical Exam Constitutional:      General: She is not in acute distress.    Appearance: She is well-developed. She is not diaphoretic.  Neck:     Musculoskeletal: Neck supple.     Thyroid: No thyromegaly.  Cardiovascular:     Rate and Rhythm: Normal rate and regular rhythm.     Pulses: Normal pulses.     Heart sounds: Murmur present.     Comments: 1/6 Pulmonary:     Effort: Pulmonary effort is normal. No respiratory distress.     Breath sounds: Normal breath sounds.     Comments:  02 dependent Uses CPAP at night  Abdominal:     General: Bowel sounds are  normal. There is no distension.     Palpations: Abdomen is soft.     Tenderness: There is no abdominal tenderness.  Musculoskeletal:     Right lower leg: Edema present.     Left lower leg: No edema.     Comments: Right leg edema Is able to move all extremities  Status post right hip fracture.     Lymphadenopathy:     Cervical: No cervical adenopathy.  Skin:    General: Skin is warm and dry.     Comments: Left upper extremity A/V fistula  + thrill/+bruit     Neurological:     Mental Status: She is alert and oriented to person, place, and time.      ASSESSMENT/ PLAN:  TODAY;   1.  Chronic constipation: is stable will continue senna s  2 tabs twice daily   2. intertrochanteric RIGHT femur fracture: is stable will continue robaxin 750 mg three times daily ; has ultram 50 mg every 6 hours as needed and vicodin 5/325 mg every 6 hours as needed   3. Protein calorie malnutrition, severe: is without change albumin 3.2 will continue prostat 30 cc twice daily   4. Coronary artery disease involving native heart with angina pectoris: is status post cabg  will continue coreg 25 mg twice daily imdur 30 mg daily    PREVIOUS  5. Diastolic heart failure; chronic: is stable will continue lasix 80 mg on Sunday Tuesday Saturday and 20 mg on Thursday; coreg 25 mg twice daily imdur 30 mg daily   6. Hypertensive heart and renal disease with congestive heart failure and end stage renal disease: is stable b/p 130/51: will continue coreg 25 mg twice daily micardis 80 mg daily apresoline 50 mg twice daily   7. Obstructive sleep apnea: is stable on CPAP nightly   8. COPD with asthma: is worse is on 02 2L continuous; will continue albuterol 2 puffs every 6 hours as needed; will begin duoneb every 6 hours through 09-10-18 then prn; will begin advair 100/50 1 puff twice daily   9. Acquired hypothyroidism: is stable tsh 3.48: will continue synthroid 75 mcg daily  10. Dyslipidemia associated with diabetes  type 2 mellitus: is stable LDL 46; will continue crestor 40 mg daily   11. Anemia due to end stage renal failure: is stable hgb 9.4; is on EPO with dialysis will contoinue tandem 162-115.2 daily   12. Chronic kidney disease with end stage renal disease on dialysis due to type 2 diabetes mellitus: is stable is on hemodialysis three times daily will continue nephrovit daily 1200 cc fluid restriction is followed by nephropathy  13. Diabetes type 2 with renal manifestation: is stable hgb a1c 5.7   14. Chronic cerebrovascular accident : is neurologically stable will continue to monitor her status.   15. Chronic depression: is stable will continue prozac 20 mg daily and trazodone 100 mg nightly     MD is aware of resident's narcotic use and is in agreement with current plan of care. We will attempt to wean resident as apropriate   Ok Edwards NP [facility]  Contact [phone] Monday through Friday 8am- 5pm  After hours call [phone]

## 2018-10-03 ENCOUNTER — Non-Acute Institutional Stay: Payer: Medicare Other | Admitting: Nurse Practitioner

## 2018-10-03 ENCOUNTER — Encounter: Payer: Self-pay | Admitting: Nurse Practitioner

## 2018-10-03 VITALS — HR 82 | Temp 97.0°F | Resp 18

## 2018-10-03 DIAGNOSIS — Z515 Encounter for palliative care: Secondary | ICD-10-CM

## 2018-10-03 DIAGNOSIS — R52 Pain, unspecified: Secondary | ICD-10-CM

## 2018-10-03 DIAGNOSIS — R0602 Shortness of breath: Secondary | ICD-10-CM

## 2018-10-03 NOTE — Progress Notes (Signed)
[facility] Telephone: [phone] Fax: [phone]  PATIENT NAME: Dawn Bradford DOB: 1935/10/20 MRN: [id number]  PRIMARY CARE PROVIDER:   Hortencia Pilar, MD  REFERRING PROVIDER:  Dr Lovell Shee[facility] [address] RESPONSIBLE PARTY:   daughter [name] at [phone], daughter [name] [place] at [phone], or husband Trayce Caravello at [phone]  RECOMMENDATIONS and PLAN:  1.Palliative care encounter Z51.5; Palliative medicine team will continue to support patient, patient's family, and medical team. Visit consisted of counseling and education dealing with the complex and emotionally intense issues of symptom management and palliative care in the setting of serious and potentially life-threatening illness  2.Pain R52.0 secondary toHip fx s/p sgy;will continue to monitor on pain scale, monitor efficacy vs adverse side effects. Continue with current pain regimen withRobaxin 750 mg tid, my Ultram 50 mg 6 hours as needed for pain and Vicodin 5 / 325 mg 6 hours as needed for pain.  3.Dyspneic R06.00 secondary toCOPD/congestive heart failure remain stable at present time.Continue with continuous O2/inhalation therapy; CPAP qhs  ASSESSMENT: I visited and observe [name] 83 She did a wake to verbal cues and make eye contact. She was interactive in answering questions. She did not symptoms of pain or shortness of breath. She shared that she is tired. We talked about her doctor's appointment with orthopedic. We talked about her new weight-bearing status. We talked about short-term Rehab Therapy which she shared has not been restarted as of yet. We talked about her functional level. We talked about her appetite. She was cooperative with palliative care assessment. She was quiet at different times throughout palliative care visit. We talked about role of palliative care and plan of care. Discussed and continue to encourage her to work with therapy. Her hopes are that she will  walk again though realizes that may not be an option. She talked about feeling very tired and fatigued overall. Therapeutic listening and emotional support provided. Discuss with [name] will follow up in 2 weeks if needed or sooner should she declined, see how short-term rehab is going. Discuss with [name] will contact her daughter Dawn Bradford for update on palliative care visit and she was in agreement.  I called Dawn Guiles, [name]'s daughter. Clinical update discuss. We talked about purpose for palliative care visit and visit with [name]. We talked about symptoms of fatigue and weakness. We talked about appetite. We talked about short-term rehab. We talked about recent Orthopedic appointment and now weight-bearing status. Dawn Bradford endorses she hopes that Ms Capraro will be able to walk again. We talked about realistic expectations as she currently is a dependent [employer]. We talked about short-term rehab and if any further decisions were made on placement or going home after. Dawn Bradford endorses that she and her sisters have not talked anymore about further plans they were just seeing what the orthopedic recommendations were going to be. Dawn Bradford talked about her being more active in self care for herself. Praised her for self-care as it does appear she is caregiver fatigue. We talked about role of palliative care and plan of care. She does her remain a full code. We talked about following up in two weeks to see how she does with therapy with further discussion of goals of care. At present time she continues aggressive interventions including hemodialysis. Therapeutic listening and emotional support provided. Questions answered to satisfaction. Contact information provided.      I spent 75 minutes providing this consultation,  from 11:00am to 12:15am. More than 50% of the time  in this consultation was spent coordinating communication.   HISTORY OF PRESENT ILLNESS:  Dawn Bradford is a 83 y.o. year old female with multiple  medical problems including End-stage renal disease on dialysis Monday, Wednesday, Friday), diastolic heart failure, coronary artery disease s/p cabg, myocardial infarction,aortic valve disorder, COPD, congestive heart failure, carotid artery disease , CVA, right artery stenosis with occluded right renal artery, right pulmonary nodule, obstructive sleep apnea, obesity hypoventilation syndrome, hypertension, hyperlipidemia, diabetes, hypothyroidism, asthma, cataract extraction, abdominal hysterectomy. Hospitalized 12 / 6 / 2019 to 12 / 17 / 2019 after a fall with right hip fracture, right intertrochanteric femoral neck fracture s/p sgy 08/17/2018. Hospitalization Complicated by anemia of chronic disease requiring transfusion with dialysis. Obstructive sleep apnea use a CPAP at night. Discharged short-term rehab at [facility] place.Ms. Critzer continues to reside at short-term rehab. She does require hoyer lift for transfers, ADL dependent. She continues to be able to feed herself though appetite does remain poor. She continues to go to hemodialysis on Monday Wednesday Friday. She did go to Orthopedic yesterday and was given full weight-bearing status. Her rehab days have been restarted and she is beginning to work with PT / OT. Staff endorses she has been sleeping more. At present she is lying in bed sleeping. She appears chronically ill, 02 dependent but comfortable. No visitors present. Palliative Care was asked to help address goals of care.   CODE STATUS: full code  PPS: 40% HOSPICE ELIGIBILITY/DIAGNOSIS: TBD  PAST MEDICAL HISTORY:  Past Medical History:  Diagnosis Date  . ACE-inhibitor cough   . Aortic valve disorder    Echo in 2009 showed mean aortic valve gradient of 13 mmHg, suggesting very mild stenosis. Echo (12/10) suggested aortic sclerosis only  . Carotid artery disease (HCC)    mild, carotid dopplers 12/2009  . CHF (congestive heart failure) ([place])   . COPD (chronic obstructive pulmonary  disease) ([place])   . Coronary artery disease    s/p anterior MI in 1996 followed by CABG. Lexiscan myoview (4/11): EF 67%, normal perfusion with no evidence for ischemia or infarction.   . Diabetes mellitus    type 2  . Diastolic heart failure    most recent echo (12/10) showed EF 55-60% with mild LVH, grade I diastolic dysfunction, mild LAE.  [name] ESRD (end stage renal disease) on dialysis ([place])   . Hyperlipidemia   . Hypertension    resistant hyptertension times many years. the patient does have renal artery stenosis. she has tried calcium channel blockers in the past and states that she would not take them now because she had some problems with her gums which her dentist identified as calcium-channel blocker side effects.  . Hypothyroidism   . Mild asthma    PFT 02/05/10 FEV1 1.38, FEV1% 73, TLC 3.78 (86%), DLCO 48%, +BD  . Myocardial infarction ([place])   . Obesity   . Obesity hypoventilation syndrome ([place])       . Obstructive sleep apnea    PSG 01/05/2006 AHI 24.8, CPAP 9cm H2O  . Pulmonary nodule, right   . Renal artery stenosis [facility])    The patient has an occluded right renal artery and an atrophic right kidney. There is 20% left renal artery stenosis. This was seen by catheterization in 2007  . Stroke [facility])     SOCIAL HX:  Social History   Tobacco Use  . Smoking status: Never Smoker  . Smokeless tobacco: Never Used  Substance Use Topics  . Alcohol use: No  ALLERGIES:  Allergies  Allergen Reactions  . Ace Inhibitors Other (See Comments)    Reaction:  Unknown   . Amlodipine Other (See Comments)    Reaction:  Unknown   . Clonidine Hydrochloride Other (See Comments)    Reaction:  Unknown   . Codeine Hives  . Latex Hives  . Metformin Nausea And Vomiting  . Tape Rash     PERTINENT MEDICATIONS:  Outpatient Encounter Medications as of 10/03/2018  Medication Sig  . acetaminophen (TYLENOL) 325 MG tablet Take 2 tablets (650 mg total) by mouth every 6 (six) hours as needed for  mild pain, fever or headache (or Fever >/= 101).  [name] albuterol (PROVENTIL HFA;VENTOLIN HFA) 108 (90 Base) MCG/ACT inhaler Inhale 2 puffs into the lungs every 6 (six) hours as needed for wheezing or shortness of breath.  . Amino Acids-Protein Hydrolys (FEEDING SUPPLEMENT, PRO-STAT SUGAR FREE 64,) LIQD Take 30 mLs by mouth 2 (two) times daily.  [name] b complex-vitamin c-folic acid (NEPHRO-VITE) 0.8 MG TABS tablet Take 1 tablet by mouth daily.  . carvedilol (COREG) 25 MG tablet Take 25 mg by mouth 2 (two) times daily.   . Cholecalciferol (VITAMIN D3) 5000 UNITS TABS Take 5,000 Units by mouth daily.  . ferrous fumarate-iron polysaccharide complex (TANDEM) 162-115.2 MG CAPS capsule Take 1 capsule by mouth daily.  [name] FLUoxetine (PROZAC) 20 MG tablet Take 20 mg by mouth daily.   . Fluticasone-Salmeterol (ADVAIR) 100-50 MCG/DOSE AEPB Inhale 1 puff into the lungs 2 (two) times daily.  . furosemide (LASIX) 20 MG tablet Give 1 tablet by mouth once daily on Sunday  . furosemide (LASIX) 80 MG tablet Give 1 tablet by mouth daily on Sunday, Tuesday and Saturday  . hydrALAZINE (APRESOLINE) 50 MG tablet Take 50 mg by mouth 2 (two) times daily.   [name] HYDROcodone-acetaminophen (NORCO/VICODIN) 5-325 MG tablet Take 1 tablet by mouth every 6 (six) hours as needed for severe pain.  . Infant Care Products [facility] EX) Apply liberal amount topically to area of skin irritation as needed.  OK to leave at bedside  . ipratropium (ATROVENT) 0.02 % nebulizer solution Take 0.5 mg by nebulization every 6 (six) hours as needed for wheezing or shortness of breath.  . isosorbide mononitrate (IMDUR) 30 MG 24 hr tablet Take 30 mg by mouth at bedtime.   [name] levothyroxine (SYNTHROID, LEVOTHROID) 75 MCG tablet Take 75 mcg by mouth daily.   [name] lidocaine-prilocaine (EMLA) cream Apply 1 application topically as needed (for pain).  . methocarbamol (ROBAXIN) 750 MG tablet Take 750 mg by mouth every 8 (eight) hours. muscle spasms and pain management   . midodrine (PROAMATINE) 5 MG tablet Take 1 tablet (5 mg total) by mouth 2 (two) times daily with a meal.  . NON FORMULARY Diet Type: Downgrade pt to Dysphagia 3 consistencies, continue thin liquids.  renal, NAS, NCS, 1200 ml fluid restriction  . nystatin (MYCOSTATIN/NYSTOP) 100000 UNIT/GM POWD Apply 1 g topically as needed (for irritation).   . OXYGEN Inhale 2 L/min into the lungs continuous.  . potassium chloride SA (K-DUR,KLOR-CON) 20 MEQ tablet Take 20 mEq by mouth daily.  . rosuvastatin (CRESTOR) 40 MG tablet Take 40 mg by mouth every evening.   Orlie Dakin Sodium (SENNA PLUS) 8.6-50 MG CAPS Take 2 capsules by mouth 2 (two) times daily.  [name] telmisartan (MICARDIS) 80 MG tablet Take 80 mg by mouth daily. At night  . traMADol (ULTRAM) 50 MG tablet Take 1 tablet (50 mg total) by mouth every 6 (six)  hours as needed for moderate pain or severe pain.  . traZODone (DESYREL) 100 MG tablet Take 100 mg by mouth at bedtime.   . triamcinolone (KENALOG) 0.5 % cream Apply 1 application topically as needed (for irritation).   [name] UNABLE TO FIND CPAP @@ Bedtime   No facility-administered encounter medications on file as of 10/03/2018.     PHYSICAL EXAM:   General: obese, chronically ill, debilitated female Cardiovascular: regular rate and rhythm Pulmonary: clear ant fields Abdomen: soft, nontender, + bowel sounds GU: no suprapubic tenderness Extremities: no edema, no joint deformities Skin: no rashes Neurological: Weakness but otherwise nonfocal/functionally quadriplegic  Sayvon Arterberry Ihor Gully, NP

## 2018-10-06 ENCOUNTER — Encounter
Admission: RE | Admit: 2018-10-06 | Discharge: 2018-10-06 | Disposition: A | Discharge status: Home / Self Care | Payer: Medicare Other | Source: Ambulatory Visit | Attending: Internal Medicine | Admitting: Internal Medicine

## 2018-10-16 ENCOUNTER — Encounter: Payer: Self-pay | Admitting: Adult Health

## 2018-10-16 ENCOUNTER — Non-Acute Institutional Stay (SKILLED_NURSING_FACILITY): Payer: Medicare Other | Admitting: Adult Health

## 2018-10-16 DIAGNOSIS — F329 Major depressive disorder, single episode, unspecified: Secondary | ICD-10-CM

## 2018-10-16 DIAGNOSIS — S72141S Displaced intertrochanteric fracture of right femur, sequela: Secondary | ICD-10-CM | POA: Diagnosis not present

## 2018-10-16 DIAGNOSIS — F32A Depression, unspecified: Secondary | ICD-10-CM

## 2018-10-16 NOTE — Progress Notes (Signed)
[facility]:   [facility] Room Number: [place] of Service:  SNF (31)   CODE STATUS: Full Code  Allergies  Allergen Reactions  . Ace Inhibitors Other (See Comments)    Reaction:  Unknown   . Amlodipine Other (See Comments)    Reaction:  Unknown   . Clonidine Hydrochloride Other (See Comments)    Reaction:  Unknown   . Codeine Hives  . Latex Hives  . Metformin Nausea And Vomiting  . Tape Rash    Chief Complaint  Patient presents with  . Acute Visit    Pain Management    HPI:  She has both prn vicodin and tramadol for management of her right hip pain. She has not used her vicodin in recent days and has on occasion used the tramadol. She has been more tearful; with feelings of depression; and anxiety. She realizes that she may never return back home. She feels as though her depression is getting worse.   Past Medical History:  Diagnosis Date  . ACE-inhibitor cough   . Aortic valve disorder    Echo in 2009 showed mean aortic valve gradient of 13 mmHg, suggesting very mild stenosis. Echo (12/10) suggested aortic sclerosis only  . Carotid artery disease (HCC)    mild, carotid dopplers 12/2009  . CHF (congestive heart failure) ([place])   . COPD (chronic obstructive pulmonary disease) ([place])   . Coronary artery disease    s/p anterior MI in 1996 followed by CABG. Lexiscan myoview (4/11): EF 67%, normal perfusion with no evidence for ischemia or infarction.   . Diabetes mellitus    type 2  . Diastolic heart failure    most recent echo (12/10) showed EF 55-60% with mild LVH, grade I diastolic dysfunction, mild LAE.  [name] ESRD (end stage renal disease) on dialysis ([place])   . Hyperlipidemia   . Hypertension    resistant hyptertension times many years. the patient does have renal artery stenosis. she has tried calcium channel blockers in the past and states that she would not take them now because she had some problems with her gums which her dentist identified  as calcium-channel blocker side effects.  . Hypothyroidism   . Mild asthma    PFT 02/05/10 FEV1 1.38, FEV1% 73, TLC 3.78 (86%), DLCO 48%, +BD  . Myocardial infarction ([place])   . Obesity   . Obesity hypoventilation syndrome ([place])       . Obstructive sleep apnea    PSG 01/05/2006 AHI 24.8, CPAP 9cm H2O  . Pulmonary nodule, right   . Renal artery stenosis [facility])    The patient has an occluded right renal artery and an atrophic right kidney. There is 20% left renal artery stenosis. This was seen by catheterization in 2007  . Stroke [facility])     Past Surgical History:  Procedure Laterality Date  . A/V FISTULAGRAM N/A 01/25/2018   Procedure: A/V FISTULAGRAM;  Surgeon: Algernon Huxley, MD;  Location: [place] [facility];  Service: Cardiovascular;  Laterality: N/A;  . ABDOMINAL HYSTERECTOMY  1985  . AV FISTULA PLACEMENT    . CATARACT EXTRACTION  2010  . CORONARY ARTERY BYPASS GRAFT  1996  . DIALYSIS FISTULA CREATION    . [place]   right  . INTRAMEDULLARY (IM) NAIL INTERTROCHANTERIC Right 08/17/2018   Procedure: INTRAMEDULLARY (IM) NAIL INTERTROCHANTRIC;  Surgeon: Earnestine Leys, MD;  Location: [facility] ORS;  Service: Orthopedics;  Laterality: Right;  . PERIPHERAL VASCULAR CATHETERIZATION  Left 01/06/2015   Procedure: A/V Shuntogram/Fistulagram;  Surgeon: Katha Cabal, MD;  Location: [place] [facility];  Service: Cardiovascular;  Laterality: Left;  . PERIPHERAL VASCULAR CATHETERIZATION N/A 02/17/2015   Procedure: Dialysis/Perma Catheter Removal;  Surgeon: Katha Cabal, MD;  Location: [place] [facility];  Service: Cardiovascular;  Laterality: N/A;  . PERIPHERAL VASCULAR CATHETERIZATION N/A 01/28/2016   Procedure: A/V Shuntogram/Fistulagram;  Surgeon: Algernon Huxley, MD;  Location: [place] [facility];  Service: Cardiovascular;  Laterality: N/A;  . PERIPHERAL VASCULAR CATHETERIZATION N/A 01/28/2016   Procedure: A/V Shunt Intervention;  Surgeon: Algernon Huxley, MD;  Location: [place]  [facility] LAB;  Service: Cardiovascular;  Laterality: N/A;  . TUBAL LIGATION  1973    Social History   Socioeconomic History  . Marital status: Married    Spouse name: Not on file  . Number of children: Not on file  . Years of education: Not on file  . Highest education level: Not on file  Occupational History  . Not on file  Social Needs  . Financial resource strain: Not on file  . Food insecurity:    Worry: Not on file    Inability: Not on file  . Transportation needs:    Medical: Not on file    Non-medical: Not on file  Tobacco Use  . Smoking status: Never Smoker  . Smokeless tobacco: Never Used  Substance and Sexual Activity  . Alcohol use: No  . Drug use: No  . Sexual activity: Not on file  Lifestyle  . Physical activity:    Days per week: Not on file    Minutes per session: Not on file  . Stress: Not on file  Relationships  . Social connections:    Talks on phone: Not on file    Gets together: Not on file    Attends religious service: Not on file    Active member of club or organization: Not on file    Attends meetings of clubs or organizations: Not on file    Relationship status: Not on file  . Intimate partner violence:    Fear of current or ex partner: Not on file    Emotionally abused: Not on file    Physically abused: Not on file    Forced sexual activity: Not on file  Other Topics Concern  . Not on file  Social History Narrative  . Not on file   Family History  Problem Relation Age of Onset  . Kidney failure Mother   . Diabetes Mother   . Aortic aneurysm Father   . Coronary artery disease Father   . Heart attack Father   . Diabetes Sister   . Diabetes Brother   . Kidney failure Brother       VITAL SIGNS BP (!) 138/45   Pulse 73   Temp 98.8 F (37.1 C)   Resp 18   Ht 5\' 3"  (1.6 m)   Wt 205 lb 8 oz (93.2 kg)   LMP 02/01/1986 (Approximate)   SpO2 97%   BMI 36.40 kg/m   Outpatient Encounter Medications as of 10/16/2018  Medication Sig    . acetaminophen (TYLENOL) 325 MG tablet Take 2 tablets (650 mg total) by mouth every 6 (six) hours as needed for mild pain, fever or headache (or Fever >/= 101).  [name] albuterol (PROVENTIL HFA;VENTOLIN HFA) 108 (90 Base) MCG/ACT inhaler Inhale 2 puffs into the lungs every 6 (six) hours as needed for wheezing or shortness of breath.  [name]  Amino Acids-Protein Hydrolys (FEEDING SUPPLEMENT, PRO-STAT SUGAR FREE 64,) LIQD Take 30 mLs by mouth 2 (two) times daily.  [name] b complex-vitamin c-folic acid (NEPHRO-VITE) 0.8 MG TABS tablet Take 1 tablet by mouth daily.  . carvedilol (COREG) 25 MG tablet Take 25 mg by mouth 2 (two) times daily.   . Cholecalciferol (VITAMIN D3) 5000 UNITS TABS Take 5,000 Units by mouth daily.  . ferrous fumarate-iron polysaccharide complex (TANDEM) 162-115.2 MG CAPS capsule Take 1 capsule by mouth daily.  [name] FLUoxetine (PROZAC) 20 MG tablet Take 20 mg by mouth daily.   . Fluticasone-Salmeterol (ADVAIR) 100-50 MCG/DOSE AEPB Inhale 1 puff into the lungs 2 (two) times daily.  . furosemide (LASIX) 20 MG tablet Give 1 tablet by mouth once daily on Thursday  . furosemide (LASIX) 80 MG tablet Give 1 tablet by mouth daily on Sunday, Tuesday and Saturday  . hydrALAZINE (APRESOLINE) 50 MG tablet Take 50 mg by mouth 2 (two) times daily.   [name] HYDROcodone-acetaminophen (NORCO/VICODIN) 5-325 MG tablet Take 1 tablet by mouth every 6 (six) hours as needed for severe pain.  . Infant Care Products [facility] EX) Apply liberal amount topically to area of skin irritation as needed.  OK to leave at bedside  . ipratropium (ATROVENT) 0.02 % nebulizer solution Take 0.5 mg by nebulization every 6 (six) hours as needed for wheezing or shortness of breath.  . isosorbide mononitrate (IMDUR) 30 MG 24 hr tablet Take 30 mg by mouth at bedtime.   [name] levothyroxine (SYNTHROID, LEVOTHROID) 75 MCG tablet Take 75 mcg by mouth daily.   [name] lidocaine-prilocaine (EMLA) cream Apply 1 application topically as needed (for pain).  .  methocarbamol (ROBAXIN) 750 MG tablet Take 750 mg by mouth every 8 (eight) hours. muscle spasms and pain management  . midodrine (PROAMATINE) 5 MG tablet Take 1 tablet (5 mg total) by mouth 2 (two) times daily with a meal.  . NON FORMULARY Diet Type:  Renal, NAS, NCS, 1200 cc fluid restriction  . nystatin (MYCOSTATIN/NYSTOP) 100000 UNIT/GM POWD Apply 1 g topically as needed (for irritation).   . ondansetron (ZOFRAN) 4 MG tablet Take 4 mg by mouth every 6 (six) hours as needed for nausea or vomiting.  . OXYGEN Inhale 2 L/min into the lungs continuous.  . potassium chloride SA (K-DUR,KLOR-CON) 20 MEQ tablet Take 20 mEq by mouth daily.  . rosuvastatin (CRESTOR) 40 MG tablet Take 40 mg by mouth every evening.   Orlie Dakin Sodium (SENNA PLUS) 8.6-50 MG CAPS Take 2 capsules by mouth 2 (two) times daily.  [name] telmisartan (MICARDIS) 80 MG tablet Take 80 mg by mouth daily. At night  . traMADol (ULTRAM) 50 MG tablet Take 1 tablet (50 mg total) by mouth every 6 (six) hours as needed for moderate pain or severe pain.  . traZODone (DESYREL) 100 MG tablet Take 100 mg by mouth at bedtime.   . triamcinolone (KENALOG) 0.5 % cream Apply 1 application topically as needed (for irritation).   [name] UNABLE TO FIND CPAP @@ Bedtime   No facility-administered encounter medications on file as of 10/16/2018.      SIGNIFICANT DIAGNOSTIC EXAMS  PREVIOUS  08-11-18: chest x-ray: Mild central venous congestion. Midline sternotomy.  08-11-18; right hip and pelvic x-ray: Acute intertrochanteric RIGHT femur fracture  09-01-18: chest x-ray: Mild central pulmonary vascular congestion. No pulmonary edema or pleural effusion.  NO NEW EXAMS.   LABS REVIEWED PREVIOUS:   12-26-17: chol 128; ldl 46; trig 115; hdl 59  07-03-18: tsh 3.48 08-11-18:  wbc 10.2; hgb 9.8; hct 29.3; mcv 107.3; plt 90 glucose 147; ldl 16; creat 2.20; k+ 3.6; na++ 133; ca 8.1; liver normal albumin 3.3 folate 31.0;  Vit B 12: 4.9 phos 2.7; mag 1.8  pre-albumin 18.8ca ionized 4.6  08-13-18: glucose 132; bun 37; creat 4.39; k+ 3.8; na++ 132; ca 8.3 PTH 59 08-15-18: wbc 8.7; hgb 7.1; hct 20.7'; mcv 106.7; plt 128 glucose 132; bun 37; creat 3.31; k+ 3.5; na++ 132; ca 8.4 albumin 2.6  08-20-18: wbc 12.1;hgb 7.6; hct 22.8; mcv 100.4; plt 151 08-21-18: wbc 9.9; hgb 9.4; hct 28.4; mcv 97.3; plt 141; glucose 110; bun 22; creat 2.04; k+ 3.4; na++ 134; ca 8.1  08-25-18: wbc 8.0; hgb 9.6; hct 29.4; mcv 101.4; plt 130 glucose 144; bun 28; creat 2.55; k+ 3.2; na++ 130; ca 8.3 liver normal albumin 3.1  08-31-18: glucose 131; bun 23; creat 2.14; k+ 3.6; na++ 131; ca 8.1  09-01-18: wbc 7.0; hgb 9.9; hct 30.7; mcv 101.3; plt 160; glucose 124; bun 18; creat 2.20; k+ 37; na++ 132; ca 8.5; liver normal albumin 3.2  NO NEW LABS.    Review of Systems  Constitutional: Negative for malaise/fatigue.  Respiratory: Negative for cough and shortness of breath.   Cardiovascular: Negative for chest pain, palpitations and leg swelling.  Gastrointestinal: Negative for abdominal pain, constipation and heartburn.  Musculoskeletal: Negative for back pain, joint pain and myalgias.  Skin: Negative.   Neurological: Negative for dizziness.  Psychiatric/Behavioral: Positive for depression. The patient is nervous/anxious.        Is tearful     Physical Exam Constitutional:      General: She is not in acute distress.    Appearance: She is well-developed. She is not diaphoretic.  Neck:     Musculoskeletal: Neck supple.     Thyroid: No thyromegaly.  Cardiovascular:     Rate and Rhythm: Normal rate and regular rhythm.     Pulses: Normal pulses.     Heart sounds: Murmur present.     Comments: 1/6 Pulmonary:     Effort: Pulmonary effort is normal. No respiratory distress.     Breath sounds: Normal breath sounds.     Comments:  02 dependent Uses CPAP at night  Abdominal:     General: Bowel sounds are normal. There is no distension.     Palpations: Abdomen is soft.      Tenderness: There is no abdominal tenderness.  Musculoskeletal:     Right lower leg: No edema.     Left lower leg: No edema.     Comments: Is able to move all extremities  Status post right hip fracture.    Lymphadenopathy:     Cervical: No cervical adenopathy.  Skin:    General: Skin is warm and dry.     Comments: Left upper extremity A/V fistula  + thrill/+bruit      Neurological:     Mental Status: She is alert and oriented to person, place, and time.     ASSESSMENT/ PLAN:  TODAY;   1.   Closed comminuted intertrochanteric fracture of right femur, sequela: is stable 2. Chronic depression: is worse  Will stop vicodin Will continue ultram through 10-23-18 Will increase prozac to 40 mg daily  Will check tsh; hgb a1c lipids      MD is aware of resident's narcotic use and is in agreement with current plan of care. We will attempt to wean resident as apropriate   Ok Edwards NP [facility]  Contact [phone] Monday through Friday 8am- 5pm  After hours call [phone]

## 2018-10-17 ENCOUNTER — Other Ambulatory Visit
Admission: RE | Admit: 2018-10-17 | Discharge: 2018-10-17 | Disposition: A | Discharge status: Home / Self Care | Payer: Medicare Other | Source: Ambulatory Visit | Attending: Adult Health | Admitting: Adult Health

## 2018-10-17 DIAGNOSIS — E1122 Type 2 diabetes mellitus with diabetic chronic kidney disease: Secondary | ICD-10-CM | POA: Insufficient documentation

## 2018-10-17 LAB — LIPID PANEL
Cholesterol: 113 mg/dL (ref 0–200)
HDL: 52 mg/dL (ref >40)
LDL Cholesterol: 53 mg/dL (ref 0–99)
Total CHOL/HDL Ratio: 2.2 RATIO
Triglycerides: 40 mg/dL (ref <150)
VLDL: 8 mg/dL (ref 0–40)

## 2018-10-17 LAB — TSH: TSH: 2.388 u[IU]/mL (ref 0.350–4.500)

## 2018-10-17 LAB — HEMOGLOBIN A1C
Hgb A1c MFr Bld: 5.5 % (ref 4.8–5.6)
Mean Plasma Glucose: 111.15 mg/dL

## 2018-10-24 ENCOUNTER — Non-Acute Institutional Stay (SKILLED_NURSING_FACILITY): Payer: Medicare Other | Admitting: Adult Health

## 2018-10-24 ENCOUNTER — Encounter: Payer: Self-pay | Admitting: Adult Health

## 2018-10-24 ENCOUNTER — Other Ambulatory Visit: Payer: Self-pay | Admitting: Adult Health

## 2018-10-24 DIAGNOSIS — I953 Hypotension of hemodialysis: Secondary | ICD-10-CM | POA: Diagnosis not present

## 2018-10-24 DIAGNOSIS — S72141A Displaced intertrochanteric fracture of right femur, initial encounter for closed fracture: Secondary | ICD-10-CM | POA: Diagnosis not present

## 2018-10-24 DIAGNOSIS — N186 End stage renal disease: Secondary | ICD-10-CM | POA: Diagnosis not present

## 2018-10-24 DIAGNOSIS — Z992 Dependence on renal dialysis: Secondary | ICD-10-CM

## 2018-10-24 DIAGNOSIS — I25119 Atherosclerotic heart disease of native coronary artery with unspecified angina pectoris: Secondary | ICD-10-CM | POA: Diagnosis not present

## 2018-10-24 DIAGNOSIS — E038 Other specified hypothyroidism: Secondary | ICD-10-CM

## 2018-10-24 DIAGNOSIS — J449 Chronic obstructive pulmonary disease, unspecified: Secondary | ICD-10-CM

## 2018-10-24 DIAGNOSIS — F339 Major depressive disorder, recurrent, unspecified: Secondary | ICD-10-CM

## 2018-10-24 DIAGNOSIS — I5032 Chronic diastolic (congestive) heart failure: Secondary | ICD-10-CM

## 2018-10-24 DIAGNOSIS — G4709 Other insomnia: Secondary | ICD-10-CM

## 2018-10-24 NOTE — Progress Notes (Signed)
Location:  [facility] Room Number: 213-A Place of Service:  SNF (31) Provider:  Durenda Age, NP  Patient Care Team: Hortencia Pilar, MD as PCP - General (Family Medicine)  Extended Emergency Contact Information Primary Emergency Contact: Azucena [place] Mobile Phone: [phone] Relation: Daughter Secondary Emergency Contact: [place] Mobile Phone: [phone] Relation: Daughter  Code Status:  Full Code  Goals of care: Advanced Directive information Advanced Directives 10/16/2018  Does Patient Have a Medical Advance Directive? No  Type of Advance Directive -  Does patient want to make changes to medical advance directive? No - Patient declined  Copy of [place] in Chart? -  Would patient like information on creating a medical advance directive? No - Patient declined     Chief Complaint  Patient presents with  . Medical Management of Chronic Issues    Routine [facility] visit    HPI:  Pt is an 83 y.o. female seen today for management of chronic medical conditions and to assess the continued use of tramadol. She is a long-term care resident of [facility]  She has a PMH of DM2, CAD, COPD, CHF, ESRD on dialysis, HLD, HTN, and MI. She was seen in her room with today with her private nurse. She is going to have hemodialysis today.  She was admitted to [facility] on 08/21/18 from a recent hospitalization post fall sustaining a right hip fracture for which she had surgery on 08/17/18.  Past Medical History:  Diagnosis Date  . ACE-inhibitor cough   . Aortic valve disorder    Echo in 2009 showed mean aortic valve gradient of 13 mmHg, suggesting very mild stenosis. Echo (12/10) suggested aortic sclerosis only  . Carotid artery disease (HCC)    mild, carotid dopplers 12/2009  . CHF (congestive heart failure) ([place])   . COPD (chronic obstructive  pulmonary disease) ([place])   . Coronary artery disease    s/p anterior MI in 1996 followed by CABG. Lexiscan myoview (4/11): EF 67%, normal perfusion with no evidence for ischemia or infarction.   . Diabetes mellitus    type 2  . Diastolic heart failure    most recent echo (12/10) showed EF 55-60% with mild LVH, grade I diastolic dysfunction, mild LAE.  [name] ESRD (end stage renal disease) on dialysis ([place])   . Hyperlipidemia   . Hypertension    resistant hyptertension times many years. the patient does have renal artery stenosis. she has tried calcium channel blockers in the past and states that she would not take them now because she had some problems with her gums which her dentist identified as calcium-channel blocker side effects.  . Hypothyroidism   . Mild asthma    PFT 02/05/10 FEV1 1.38, FEV1% 73, TLC 3.78 (86%), DLCO 48%, +BD  . Myocardial infarction ([place])   . Obesity   . Obesity hypoventilation syndrome ([place])       . Obstructive sleep apnea    PSG 01/05/2006 AHI 24.8, CPAP 9cm H2O  . Pulmonary nodule, right   . Renal artery stenosis [facility])    The patient has an occluded right renal artery and an atrophic right kidney. There is 20% left renal artery stenosis. This was seen by catheterization in 2007  . Stroke [facility])    Past Surgical History:  Procedure Laterality Date  . A/V FISTULAGRAM N/A 01/25/2018   Procedure: A/V FISTULAGRAM;  Surgeon: Algernon Huxley,  MD;  Location: [place] CV LAB;  Service: Cardiovascular;  Laterality: N/A;  . ABDOMINAL HYSTERECTOMY  1985  . AV FISTULA PLACEMENT    . CATARACT EXTRACTION  2010  . CORONARY ARTERY BYPASS GRAFT  1996  . DIALYSIS FISTULA CREATION    . [place]   right  . INTRAMEDULLARY (IM) NAIL INTERTROCHANTERIC Right 08/17/2018   Procedure: INTRAMEDULLARY (IM) NAIL INTERTROCHANTRIC;  Surgeon: Earnestine Leys, MD;  Location: [facility] ORS;  Service: Orthopedics;  Laterality: Right;  . PERIPHERAL VASCULAR CATHETERIZATION Left 01/06/2015     Procedure: A/V Shuntogram/Fistulagram;  Surgeon: Katha Cabal, MD;  Location: [place] CV LAB;  Service: Cardiovascular;  Laterality: Left;  . PERIPHERAL VASCULAR CATHETERIZATION N/A 02/17/2015   Procedure: Dialysis/Perma Catheter Removal;  Surgeon: Katha Cabal, MD;  Location: [place] [facility];  Service: Cardiovascular;  Laterality: N/A;  . PERIPHERAL VASCULAR CATHETERIZATION N/A 01/28/2016   Procedure: A/V Shuntogram/Fistulagram;  Surgeon: Algernon Huxley, MD;  Location: [place] CV LAB;  Service: Cardiovascular;  Laterality: N/A;  . PERIPHERAL VASCULAR CATHETERIZATION N/A 01/28/2016   Procedure: A/V Shunt Intervention;  Surgeon: Algernon Huxley, MD;  Location: [place] [facility];  Service: Cardiovascular;  Laterality: N/A;  . TUBAL LIGATION  1973    Allergies  Allergen Reactions  . Ace Inhibitors Other (See Comments)    Reaction:  Unknown   . Amlodipine Other (See Comments)    Reaction:  Unknown   . Clonidine Hydrochloride Other (See Comments)    Reaction:  Unknown   . Codeine Hives  . Latex Hives  . Metformin Nausea And Vomiting  . Tape Rash    Outpatient Encounter Medications as of 10/24/2018  Medication Sig  . acetaminophen (TYLENOL) 325 MG tablet Take 2 tablets (650 mg total) by mouth every 6 (six) hours as needed for mild pain, fever or headache (or Fever >/= 101).  [name] albuterol (PROVENTIL HFA;VENTOLIN HFA) 108 (90 Base) MCG/ACT inhaler Inhale 2 puffs into the lungs every 6 (six) hours as needed for wheezing or shortness of breath.  . Amino Acids-Protein Hydrolys (FEEDING SUPPLEMENT, PRO-STAT SUGAR FREE 64,) LIQD Take 30 mLs by mouth 2 (two) times daily.  [name] b complex-vitamin c-folic acid (NEPHRO-VITE) 0.8 MG TABS tablet Take 1 tablet by mouth daily.  . carvedilol (COREG) 25 MG tablet Take 25 mg by mouth 2 (two) times daily.   . Cholecalciferol (VITAMIN D3) 5000 UNITS TABS Take 5,000 Units by mouth daily.  . ferrous fumarate-iron polysaccharide complex (TANDEM)  162-115.2 MG CAPS capsule Take 1 capsule by mouth daily.  [name] FLUoxetine (PROZAC) 40 MG capsule Take 40 mg by mouth daily.  . Fluticasone-Salmeterol (ADVAIR) 100-50 MCG/DOSE AEPB Inhale 1 puff into the lungs 2 (two) times daily.  . furosemide (LASIX) 20 MG tablet Give 1 tablet by mouth once daily on Thursday  . furosemide (LASIX) 80 MG tablet Give 1 tablet by mouth daily on Sunday, Tuesday and Saturday  . hydrALAZINE (APRESOLINE) 50 MG tablet Take 50 mg by mouth 2 (two) times daily.   . Infant Care Products [facility] EX) Apply liberal amount topically to area of skin irritation as needed.  OK to leave at bedside  . ipratropium (ATROVENT) 0.02 % nebulizer solution Take 0.5 mg by nebulization every 6 (six) hours as needed for wheezing or shortness of breath.  . isosorbide mononitrate (IMDUR) 30 MG 24 hr tablet Take 30 mg by mouth at bedtime.   [name] levothyroxine (SYNTHROID, LEVOTHROID) 75 MCG tablet Take 75  mcg by mouth daily.   [name] lidocaine-prilocaine (EMLA) cream Apply 1 application topically as needed (for pain).  . methocarbamol (ROBAXIN) 750 MG tablet Take 750 mg by mouth every 8 (eight) hours. muscle spasms and pain management  . midodrine (PROAMATINE) 5 MG tablet Take 1 tablet (5 mg total) by mouth 2 (two) times daily with a meal.  . NON FORMULARY Diet Type:  Renal, NAS, NCS, 1200 cc fluid restriction  . nystatin (MYCOSTATIN/NYSTOP) 100000 UNIT/GM POWD Apply 1 g topically as needed (for irritation).   . OXYGEN Inhale 2 L/min into the lungs continuous.  . rosuvastatin (CRESTOR) 40 MG tablet Take 40 mg by mouth every evening.   Orlie Dakin Sodium (SENNA PLUS) 8.6-50 MG CAPS Take 2 capsules by mouth 2 (two) times daily.  [name] telmisartan (MICARDIS) 80 MG tablet Take 80 mg by mouth daily. At night  . traMADol (ULTRAM) 50 MG tablet Take 1 tablet (50 mg total) by mouth every 6 (six) hours as needed for moderate pain or severe pain.  . traZODone (DESYREL) 100 MG tablet Take 100 mg by mouth at  bedtime.   . triamcinolone (KENALOG) 0.5 % cream Apply 1 application topically as needed (for irritation).   [name] UNABLE TO FIND CPAP @@ Bedtime  . [DISCONTINUED] FLUoxetine (PROZAC) 20 MG tablet Take 20 mg by mouth daily.   . [DISCONTINUED] HYDROcodone-acetaminophen (NORCO/VICODIN) 5-325 MG tablet Take 1 tablet by mouth every 6 (six) hours as needed for severe pain.  . [DISCONTINUED] potassium chloride SA (K-DUR,KLOR-CON) 20 MEQ tablet Take 20 mEq by mouth daily.   No facility-administered encounter medications on file as of 10/24/2018.     Review of Systems  GENERAL: No change in appetite, no fatigue, no weight changes, no fever, chills or weakness SKIN: Denies rash, itching, wounds, ulcer sores, or nail abnormalities EYES: Denies change in vision, dry eyes, eye pain, itching or discharge EARS: Denies change in hearing, ringing in ears, or earache NOSE: Denies nasal congestion or epistaxis MOUTH and THROAT: Denies oral discomfort, gingival pain or bleeding, pain from teeth or hoarseness   RESPIRATORY: no cough, SOB, DOE, wheezing, hemoptysis CARDIAC: No chest pain, edema or palpitations GI: No abdominal pain, diarrhea, constipation, heart burn, nausea or vomiting GU: Denies dysuria, frequency, hematuria, incontinence, or discharge MUSCULOSKELETAL: Denies joint pain, muscle pain, back pain, restricted movement, or unusual weakness CIRCULATION: Denies claudication, edema of legs, varicosities, or cold extremities NEUROLOGICAL: Denies dizziness, syncope, numbness, or headache PSYCHIATRIC: Denies feelings of depression or anxiety. No report of hallucinations, insomnia, paranoia, or agitation ENDOCRINE: Denies polyphagia, polyuria, polydipsia, heat or cold intolerance HEME/LYMPH: Denies excessive bruising, petechia, enlarged lymph nodes, or bleeding problems IMMUNOLOGIC: Denies history of frequent infections, AIDS, or use of immunosuppressive agents   Immunization History  Administered  Date(s) Administered  . Influenza Split 05/31/2011, 06/20/2012, 05/30/2013, 06/05/2014  . Influenza Whole 07/06/2009, 06/05/2010  . Influenza,inj,Quad PF,6+ Mos 05/15/2015  . Influenza-Unspecified 06/20/2012, 05/30/2013, 06/11/2014, 06/04/2016, 06/26/2017  . Pneumococcal Conjugate-13 01/07/2014, 02/09/2015  . Pneumococcal Polysaccharide-23 07/06/2005, 02/19/2015  . Tdap 11/29/2010  . Zoster Recombinat (Shingrix) 07/20/2018   Pertinent  Health Maintenance Due  Topic Date Due  . FOOT EXAM  11/02/2018 (Originally 12/27/1945)  . OPHTHALMOLOGY EXAM  11/02/2018 (Originally 12/27/1945)  . INFLUENZA VACCINE  12/04/2018 (Originally 04/05/2018)  . HEMOGLOBIN A1C  04/17/2019  . PNA vac Low Risk Adult  Completed  . DEXA SCAN  Discontinued    Vitals:   10/24/18 1043  BP: 130/65  Pulse: 100  Resp: 18  Temp: 98.5 F (36.9 C)  TempSrc: Oral  SpO2: 94%  Weight: 207 lb 6.4 oz (94.1 kg)  Height: 5\' 3"  (1.6 m)   Body mass index is 36.74 kg/m.  Physical Exam  GENERAL APPEARANCE: Well nourished. In no acute distress. Obese SKIN:  Skin is warm and dry.  MOUTH and THROAT: Lips are without lesions. Oral mucosa is moist and without lesions.  RESPIRATORY: Breathing is even & unlabored, BS CTAB, O2 @ 2L/min via [place] continuously CARDIAC: RRR, no murmur,no extra heart sounds, BLE 1-2+ edema, LUE AV fistula + bruit/thrill GI: Abdomen soft, normal BS, no masses, no tenderness EXTREMITIES:  Able to move X 4 extremities NEUROLOGICAL: There is no tremor. Speech is clear. Alert to self, disoriented to time and place. PSYCHIATRIC: Affect and behavior are appropriate  Labs reviewed: Recent Labs    08/11/18 0057  08/13/18 1206  08/15/18 1300  08/17/18 2107  08/25/18 1349 08/31/18 0500 09/01/18 1932  NA 133*   < >  --    < >  --   --  130*   < > 130* 131* 132*  K 3.6   < >  --    < >  --    < > 3.7   < > 3.2* 3.6 3.7  CL 99   < >  --    < >  --   --  97*   < > 95* 97* 97*  CO2 26   < >  --    < >  --    --  23   < > 27 27 28   GLUCOSE 147*   < >  --    < >  --   --  158*   < > 144* 123* 124*  BUN 16   < >  --    < >  --   --  40*   < > 28* 23 18  CREATININE 2.20*   < >  --    < >  --   --  3.22*   < > 2.55* 2.14* 2.20*  CALCIUM 8.1*   < >  --    < >  --   --  8.3*   < > 8.3* 8.1* 8.5*  MG 1.8  --   --   --   --   --   --   --   --   --   --   PHOS 2.7  --  4.1  --  2.5  --  3.6  --   --   --   --    < > = values in this interval not displayed.   Recent Labs    08/11/18 0057  08/17/18 2107 08/25/18 1349 09/01/18 1932  AST 25  --   --  32 32  ALT 14  --   --  13 20  ALKPHOS 50  --   --  88 111  BILITOT 0.8  --   --  1.2 1.0  PROT 5.6*  --   --  5.5* 5.9*  ALBUMIN 3.3*   < > 2.8* 3.1* 3.2*   < > = values in this interval not displayed.   Recent Labs    08/11/18 0057  08/21/18 0344 08/25/18 1349 09/01/18 1932  WBC 10.2   < > 9.9 8.0 7.0  NEUTROABS 8.0*  --   --   --  4.6  HGB 9.8*   < > 9.4* 9.6*  9.9*  HCT 29.3*   < > 28.4* 29.4* 30.7*  MCV 107.3*   < > 97.3 101.4* 101.3*  PLT 90*   < > 141* 130* 160   < > = values in this interval not displayed.   Lab Results  Component Value Date   TSH 2.388 10/17/2018   Lab Results  Component Value Date   HGBA1C 5.5 10/17/2018   Lab Results  Component Value Date   CHOL 113 10/17/2018   HDL 52 10/17/2018   LDLCALC 53 10/17/2018   TRIG 40 10/17/2018   CHOLHDL 2.2 10/17/2018    Assessment/Plan  1. Closed intertrochanteric fracture of hip, right, initial encounter ([place]) - S/P intramedullary (IM) nail intertrochanteric with trochanteric fixation nail on 12/13, will decrease Robaxin from 750 mg q. 8 hours to 500 mg every 8 hours for muscle spasm and continue tramadol 50 mg 1 tab every 6 hours as needed for pain   2. Other insomnia -Stable continue trazodone 100 mg at bedtime  3. DIASTOLIC HEART FAILURE, CHRONIC -No SOB, continue carvedilol 25 mg twice a day, Lasix 20 mg 1 tab daily on Thursdays and 80 mg daily on Sunday,  Tuesday, Saturday, hydralazine 50 mg 1 tab twice a day, isosorbide mononitrate ER 24-hour 30 mg 1 tab at bedtime  4. ESRD on dialysis ([place]) -Continue dialysis q. Mondays, Wednesdays, and Fridays  5.  Hypothyroidism Lab Results  Component Value Date   TSH 2.388 10/17/2018  -Continue levothyroxine 75 mcg 1 tab daily   6. Hemodialysis-associated hypotension -Continue midodrine 5 mg 1 tab twice a day  7. COPD with asthma ([place]) -No wheezing, stable, continue ipratropium bromide solution 0.02% inhalation every 6 hours PRN, Advair Diskus 100-50 mcg/dose inhalation every 12 hours  8. Depression, recurrent ([place]) -Stable, continue fluoxetine 40 mg daily  10. CAD -No chest pains, continue telmisartan 80 mg 1 tab daily and isosorbide mononitrate extended release 24-hour 30 mg 1 tab at bedtime, rosuvastatin 40 mg 1 tab daily   Family/ staff Communication: Discussed plan of care with resident.  Labs/tests ordered:  None  Goals of care:   Long-term care.   Durenda Age, NP [facility] [phone] (Monday-Friday 8:00 a.m. - 5:00 p.m.) [phone] (after hours)

## 2018-11-05 ENCOUNTER — Encounter: Payer: Self-pay | Admitting: Adult Health

## 2018-11-05 ENCOUNTER — Non-Acute Institutional Stay (SKILLED_NURSING_FACILITY): Payer: Medicare Other | Admitting: Adult Health

## 2018-11-05 DIAGNOSIS — S72141S Displaced intertrochanteric fracture of right femur, sequela: Secondary | ICD-10-CM | POA: Diagnosis not present

## 2018-11-05 DIAGNOSIS — Z992 Dependence on renal dialysis: Secondary | ICD-10-CM

## 2018-11-05 DIAGNOSIS — I25119 Atherosclerotic heart disease of native coronary artery with unspecified angina pectoris: Secondary | ICD-10-CM

## 2018-11-05 DIAGNOSIS — G4709 Other insomnia: Secondary | ICD-10-CM

## 2018-11-05 DIAGNOSIS — F322 Major depressive disorder, single episode, severe without psychotic features: Secondary | ICD-10-CM

## 2018-11-05 DIAGNOSIS — E039 Hypothyroidism, unspecified: Secondary | ICD-10-CM | POA: Diagnosis not present

## 2018-11-05 DIAGNOSIS — I953 Hypotension of hemodialysis: Secondary | ICD-10-CM

## 2018-11-05 DIAGNOSIS — J449 Chronic obstructive pulmonary disease, unspecified: Secondary | ICD-10-CM

## 2018-11-05 DIAGNOSIS — N186 End stage renal disease: Secondary | ICD-10-CM

## 2018-11-05 DIAGNOSIS — I5032 Chronic diastolic (congestive) heart failure: Secondary | ICD-10-CM

## 2018-11-05 MED ORDER — TRAMADOL HCL 50 MG PO TABS: 50.0000 mg | ORAL_TABLET | Freq: Four times a day (QID) | ORAL | 0 refills | Status: AC | PRN

## 2018-11-05 NOTE — Progress Notes (Signed)
[facility]:  [facility] Room Number: 213-A Place of Service:  SNF (31) Provider:  Durenda Age, NP  Patient Care Team: Hortencia Pilar, MD as PCP - General (Family Medicine)  Extended Emergency Contact Information Primary Emergency Contact: Azucena [place] Mobile Phone: [phone] Relation: Daughter Secondary Emergency Contact: [place] Mobile Phone: [phone] Relation: Daughter  Code Status:  Full Code  Goals of care: Advanced Directive information Advanced Directives 10/16/2018  Does Patient Have a Medical Advance Directive? No  Type of Advance Directive -  Does patient want to make changes to medical advance directive? No - Patient declined  Copy of [place] in Chart? -  Would patient like information on creating a medical advance directive? No - Patient declined     Chief Complaint  Patient presents with  . Discharge Note    Patient is to transfer to [facility] SNF on 11/06/18    HPI:  Pt is an 83 y.o. female seen today for discharge.  She is to transfer to [facility] on 11/06/18 for continued care. She has been a short-term rehabilitation resident at [facility] prior to transfer to [facility]  She has a PMH of COPD, CAD, DM2, CHF, ESRD on dialysis, HLD, HTN, and MI. She was seen in the room today. She denies having any pain nor nausea at this time.  She was admitted to [facility] on 08/21/2018 from a recent hospitalization post fall sustaining a right hip fracture for which she had surgery on 08/17/2018.   Past Medical History:  Diagnosis Date  . ACE-inhibitor cough   . Aortic valve disorder    Echo in 2009 showed mean aortic valve gradient of 13 mmHg, suggesting very mild stenosis. Echo (12/10) suggested aortic sclerosis only  . Carotid artery disease (HCC)    mild, carotid dopplers 12/2009  . CHF (congestive heart failure) ([place])    . COPD (chronic obstructive pulmonary disease) ([place])   . Coronary artery disease    s/p anterior MI in 1996 followed by CABG. Lexiscan myoview (4/11): EF 67%, normal perfusion with no evidence for ischemia or infarction.   . Diabetes mellitus    type 2  . Diastolic heart failure    most recent echo (12/10) showed EF 55-60% with mild LVH, grade I diastolic dysfunction, mild LAE.  [name] ESRD (end stage renal disease) on dialysis ([place])   . Hyperlipidemia   . Hypertension    resistant hyptertension times many years. the patient does have renal artery stenosis. she has tried calcium channel blockers in the past and states that she would not take them now because she had some problems with her gums which her dentist identified as calcium-channel blocker side effects.  . Hypothyroidism   . Mild asthma    PFT 02/05/10 FEV1 1.38, FEV1% 73, TLC 3.78 (86%), DLCO 48%, +BD  . Myocardial infarction ([place])   . Obesity   . Obesity hypoventilation syndrome ([place])       . Obstructive sleep apnea    PSG 01/05/2006 AHI 24.8, CPAP 9cm H2O  . Pulmonary nodule, right   . Renal artery stenosis [facility])    The patient has an occluded right renal artery and an atrophic right kidney. There is 20% left renal artery stenosis. This was seen by catheterization in 2007  . Stroke [facility])    Past Surgical History:  Procedure Laterality Date  . A/V FISTULAGRAM N/A 01/25/2018  Procedure: A/V FISTULAGRAM;  Surgeon: Algernon Huxley, MD;  Location: [place] CV LAB;  Service: Cardiovascular;  Laterality: N/A;  . ABDOMINAL HYSTERECTOMY  1985  . AV FISTULA PLACEMENT    . CATARACT EXTRACTION  2010  . CORONARY ARTERY BYPASS GRAFT  1996  . DIALYSIS FISTULA CREATION    . [place]   right  . INTRAMEDULLARY (IM) NAIL INTERTROCHANTERIC Right 08/17/2018   Procedure: INTRAMEDULLARY (IM) NAIL INTERTROCHANTRIC;  Surgeon: Earnestine Leys, MD;  Location: [facility] ORS;  Service: Orthopedics;  Laterality: Right;  . PERIPHERAL VASCULAR  CATHETERIZATION Left 01/06/2015   Procedure: A/V Shuntogram/Fistulagram;  Surgeon: Katha Cabal, MD;  Location: [place] CV LAB;  Service: Cardiovascular;  Laterality: Left;  . PERIPHERAL VASCULAR CATHETERIZATION N/A 02/17/2015   Procedure: Dialysis/Perma Catheter Removal;  Surgeon: Katha Cabal, MD;  Location: [place] CV LAB;  Service: Cardiovascular;  Laterality: N/A;  . PERIPHERAL VASCULAR CATHETERIZATION N/A 01/28/2016   Procedure: A/V Shuntogram/Fistulagram;  Surgeon: Algernon Huxley, MD;  Location: [place] CV LAB;  Service: Cardiovascular;  Laterality: N/A;  . PERIPHERAL VASCULAR CATHETERIZATION N/A 01/28/2016   Procedure: A/V Shunt Intervention;  Surgeon: Algernon Huxley, MD;  Location: [place] CV LAB;  Service: Cardiovascular;  Laterality: N/A;  . TUBAL LIGATION  1973    Allergies  Allergen Reactions  . Ace Inhibitors Other (See Comments)    Reaction:  Unknown   . Amlodipine Other (See Comments)    Reaction:  Unknown   . Clonidine Hydrochloride Other (See Comments)    Reaction:  Unknown   . Codeine Hives  . Latex Hives  . Metformin Nausea And Vomiting  . Tape Rash    Outpatient Encounter Medications as of 11/05/2018  Medication Sig  . acetaminophen (TYLENOL) 325 MG tablet Take 2 tablets (650 mg total) by mouth every 6 (six) hours as needed for mild pain, fever or headache (or Fever >/= 101).  [name] albuterol (PROVENTIL HFA;VENTOLIN HFA) 108 (90 Base) MCG/ACT inhaler Inhale 2 puffs into the lungs every 6 (six) hours as needed for wheezing or shortness of breath.  . Amino Acids-Protein Hydrolys (FEEDING SUPPLEMENT, PRO-STAT SUGAR FREE 64,) LIQD Take 30 mLs by mouth 2 (two) times daily.  [name] b complex-vitamin c-folic acid (NEPHRO-VITE) 0.8 MG TABS tablet Take 1 tablet by mouth daily.  . carvedilol (COREG) 25 MG tablet Take 25 mg by mouth 2 (two) times daily.   . Cholecalciferol (VITAMIN D3) 5000 UNITS TABS Take 5,000 Units by mouth daily.  . ferrous fumarate-iron  polysaccharide complex (TANDEM) 162-115.2 MG CAPS capsule Take 1 capsule by mouth daily.  [name] FLUoxetine (PROZAC) 40 MG capsule Take 40 mg by mouth daily.  . Fluticasone-Salmeterol (ADVAIR) 100-50 MCG/DOSE AEPB Inhale 1 puff into the lungs 2 (two) times daily.  . furosemide (LASIX) 20 MG tablet Take 20 mg by mouth once a week. Give on Thursday  . furosemide (LASIX) 80 MG tablet Give 1 tablet by mouth daily on Sunday, Tuesday and Saturday  . hydrALAZINE (APRESOLINE) 50 MG tablet Take 50 mg by mouth 2 (two) times daily.   . Infant Care Products [facility] EX) Apply liberal amount topically to area of skin irritation as needed.  OK to leave at bedside  . ipratropium (ATROVENT) 0.02 % nebulizer solution Take 0.5 mg by nebulization every 6 (six) hours as needed for wheezing or shortness of breath.  . isosorbide mononitrate (IMDUR) 30 MG 24 hr tablet Take 30 mg by mouth at bedtime.   [name]  levothyroxine (SYNTHROID, LEVOTHROID) 75 MCG tablet Take 75 mcg by mouth daily.   [name] lidocaine-prilocaine (EMLA) cream Apply 1 application topically as needed (for pain).  . midodrine (PROAMATINE) 5 MG tablet Take 1 tablet (5 mg total) by mouth 2 (two) times daily with a meal.  . NON FORMULARY Diet Type:  Renal, NAS, NCS, 1200 cc fluid restriction  . nystatin (MYCOSTATIN/NYSTOP) 100000 UNIT/GM POWD Apply 1 g topically as needed (for irritation).   . OXYGEN Inhale 2 L/min into the lungs continuous.  . rosuvastatin (CRESTOR) 40 MG tablet Take 40 mg by mouth every evening.   Orlie Dakin Sodium (SENNA PLUS) 8.6-50 MG CAPS Take 2 capsules by mouth 2 (two) times daily as needed.   [name] telmisartan (MICARDIS) 80 MG tablet Take 80 mg by mouth daily. At night  . traMADol (ULTRAM) 50 MG tablet Take 1 tablet (50 mg total) by mouth every 6 (six) hours as needed for moderate pain or severe pain.  . traZODone (DESYREL) 100 MG tablet Take 100 mg by mouth at bedtime.   . triamcinolone (KENALOG) 0.5 % cream Apply 1 application  topically as needed (for irritation).   [name] UNABLE TO FIND CPAP @@ Bedtime  . [DISCONTINUED] methocarbamol (ROBAXIN) 750 MG tablet Take 750 mg by mouth every 8 (eight) hours. muscle spasms and pain management   No facility-administered encounter medications on file as of 11/05/2018.     Review of Systems  GENERAL: No change in appetite, no fatigue, no weight changes, no fever, chills or weakness MOUTH and THROAT: Denies oral discomfort, gingival pain or bleeding RESPIRATORY: no cough, SOB, DOE, wheezing, hemoptysis CARDIAC: No chest pain, edema or palpitations GI: No abdominal pain, diarrhea, constipation, heart burn, nausea or vomiting GU: Denies dysuria, frequency, hematuria, incontinence, or discharge PSYCHIATRIC: Denies feelings of depression or anxiety. No report of hallucinations, insomnia, paranoia, or agitation    Immunization History  Administered Date(s) Administered  . Influenza Split 05/31/2011, 06/20/2012, 05/30/2013, 06/05/2014  . Influenza Whole 07/06/2009, 06/05/2010  . Influenza,inj,Quad PF,6+ Mos 05/15/2015  . Influenza-Unspecified 06/20/2012, 05/30/2013, 06/11/2014, 06/04/2016, 06/26/2017  . Pneumococcal Conjugate-13 01/07/2014, 02/09/2015  . Pneumococcal Polysaccharide-23 07/06/2005, 02/19/2015  . Tdap 11/29/2010  . Zoster Recombinat (Shingrix) 07/20/2018   Pertinent  Health Maintenance Due  Topic Date Due  . FOOT EXAM  12/27/1945  . OPHTHALMOLOGY EXAM  12/27/1945  . INFLUENZA VACCINE  12/04/2018 (Originally 04/05/2018)  . HEMOGLOBIN A1C  04/17/2019  . PNA vac Low Risk Adult  Completed  . DEXA SCAN  Discontinued    Vitals:   11/05/18 0850  BP: 121/67  Pulse: 70  Resp: 18  Temp: (!) 97 F (36.1 C)  TempSrc: Oral  SpO2: 97%  Weight: 203 lb 8 oz (92.3 kg)  Height: 5\' 3"  (1.6 m)   Body mass index is 36.05 kg/m.  Physical Exam  GENERAL APPEARANCE: Well nourished. In no acute distress. Obese SKIN:  Skin is warm and dry.  MOUTH and THROAT: Lips are  without lesions. Oral mucosa is moist and without lesions.  RESPIRATORY: Breathing is even & unlabored, BS CTAB CARDIAC: RRR, no murmur,no extra heart sounds, BLE trace edema,  Left forearm AV fistula + bruit/thrill GI: Abdomen soft, normal BS, no masses, no tenderness EXTREMITIES:  Able to move X 4 extremities NEUROLOGICAL: There is no tremor. Speech is clear. Alert to self and time, disoriented to place. PSYCHIATRIC: . Affect and behavior are appropriate   Labs reviewed: Recent Labs    08/11/18 0057  08/13/18 1206  08/15/18 1300  08/17/18 2107  08/25/18 1349 08/31/18 0500 09/01/18 1932  NA 133*   < >  --    < >  --   --  130*   < > 130* 131* 132*  K 3.6   < >  --    < >  --    < > 3.7   < > 3.2* 3.6 3.7  CL 99   < >  --    < >  --   --  97*   < > 95* 97* 97*  CO2 26   < >  --    < >  --   --  23   < > 27 27 28   GLUCOSE 147*   < >  --    < >  --   --  158*   < > 144* 123* 124*  BUN 16   < >  --    < >  --   --  40*   < > 28* 23 18  CREATININE 2.20*   < >  --    < >  --   --  3.22*   < > 2.55* 2.14* 2.20*  CALCIUM 8.1*   < >  --    < >  --   --  8.3*   < > 8.3* 8.1* 8.5*  MG 1.8  --   --   --   --   --   --   --   --   --   --   PHOS 2.7  --  4.1  --  2.5  --  3.6  --   --   --   --    < > = values in this interval not displayed.   Recent Labs    08/11/18 0057  08/17/18 2107 08/25/18 1349 09/01/18 1932  AST 25  --   --  32 32  ALT 14  --   --  13 20  ALKPHOS 50  --   --  88 111  BILITOT 0.8  --   --  1.2 1.0  PROT 5.6*  --   --  5.5* 5.9*  ALBUMIN 3.3*   < > 2.8* 3.1* 3.2*   < > = values in this interval not displayed.   Recent Labs    08/11/18 0057  08/21/18 0344 08/25/18 1349 09/01/18 1932  WBC 10.2   < > 9.9 8.0 7.0  NEUTROABS 8.0*  --   --   --  4.6  HGB 9.8*   < > 9.4* 9.6* 9.9*  HCT 29.3*   < > 28.4* 29.4* 30.7*  MCV 107.3*   < > 97.3 101.4* 101.3*  PLT 90*   < > 141* 130* 160   < > = values in this interval not displayed.   Lab Results  Component  Value Date   TSH 2.388 10/17/2018   Lab Results  Component Value Date   HGBA1C 5.5 10/17/2018   Lab Results  Component Value Date   CHOL 113 10/17/2018   HDL 52 10/17/2018   LDLCALC 53 10/17/2018   TRIG 40 10/17/2018   CHOLHDL 2.2 10/17/2018    Assessment/Plan  1. Closed comminuted intertrochanteric fracture of right femur, sequela - traMADol (ULTRAM) 50 MG tablet; Take 1 tablet (50 mg total) by mouth every 6 (six) hours as needed for up to 5 days for moderate pain or severe pain.  Dispense: 20 tablet; Refill:  0 -We will change Robaxin 500 mg from Q8 hours routinely to Q 8 hours PRN for muscle spasm - will continue further rehabilitation, PT/OT, at [facility]  2. Coronary artery disease involving native coronary artery of native heart with angina pectoris (HCC) -No complaints of chest pain, continue isosorbide mononitrate ER 24-hour 30 mg 1 tab at bedtime, rosuvastatin 40 mg 1 tab daily  3. ESRD on dialysis [facility]) -We will continue hemodialysis q. MWF  4. Acquired hypothyroidism Lab Results  Component Value Date   TSH 2.388 10/17/2018  -Continue levothyroxine 75 mcg 1 tab daily   5. COPD with asthma ([place]) -No wheezing nor SOB, continue O2 Proventil HFA as needed, Advair Diskus 100-50 mcg/dose inhalation every 12 hours, ipratropium bromide 0.02% inhalation as needed  6. DIASTOLIC HEART FAILURE, CHRONIC - no SOB, stale, continue Lasix 20 mg 1 tab on Thursdays and 80 mg 1 tab on Sundays, Tuesdays, Saturdays, carvedilol 25 mg 1 tab twice a day, hydralazine 50 mg 1 tab twice a day, telmisartan 80 mg 1 tab at bedtime  7. Other insomnia -Continue trazodone 100 mg 1 tab at bedtime  8. Depression, major, single episode, severe (HCC) -Mood is stable, continue fluoxetine 40 mg 1 capsule daily   9.  Hemodialysis associated hypotension -Stable, continue midodrine 5 mg 1 tab twice a day with meals     I have filled out patient's discharge paperwork and e-prescribed  Tramadol.  DME provided: None  Total discharge time: Greater than 30 minutes Greater than 50% was spent in counseling and coordination of care.   Discharge time involved coordination of the discharge process with social worker, nursing staff and therapy department.    Durenda Age, NP [facility] [phone] (Monday-Friday 8:00 a.m. - 5:00 p.m.) [phone] (after hours)

## 2018-11-20 ENCOUNTER — Other Ambulatory Visit: Payer: Self-pay

## 2018-11-20 ENCOUNTER — Non-Acute Institutional Stay: Payer: Medicare Other | Admitting: Student

## 2018-11-20 VITALS — BP 130/58 | HR 76

## 2018-11-20 DIAGNOSIS — Z515 Encounter for palliative care: Secondary | ICD-10-CM

## 2018-11-20 NOTE — Progress Notes (Signed)
[facility] Palliative Care Consult Note Telephone: [phone]  Fax: [phone]  PATIENT NAME: Dawn Bradford DOB: [birth year] MRN: [id number]  PRIMARY CARE PROVIDER:   Dr. Virgel Bouquet  REFERRING PROVIDER:  Dr. Virgel Bouquet  RESPONSIBLE PARTY: Daughter, Dawn Bradford at [phone]  ASSESSMENT: Dawn Bradford is sitting in wheel chair upon arrival; she had just returned from therapy. Discussed role of Palliative Medicine. We discussed ongoing goals of care; she states she would like to continue her therapy and to be able to walk more. We discussed symptom management; staff is asked to administer senna for her constipation. Discussed with staff nurse [place]. Palliative Medicine will continue to follow and provide ongoing support. Daughter [name] to be notified to discuss and provide an update.       RECOMMENDATIONS and PLAN:  1. Code Status: Full Code. 2. Medical goals of therapy: Dawn Bradford will continue Hemodialysis on Mon-Wed-Friday. She will continue OT, PT, ST as directed. Palliative Medicine will monitor for changes/declines and make recommendations as needed. 3. Symptom management: Pain-continue tramadol 50mg  every 6 hours prn, acetaminophen 650mg  every 6 hours prn. Dyspnea-continue oxygen via nasal canula at 2 lpm, inhalers as directed, CPAP at bedtime. Constipation-senna-S twice daily prn for constipation. 4. Discharge Planning: Dawn Bradford will continue to reside at [facility].  Palliative Medicine will follow up in 6 weeks or sooner, if needed.  I spent 15 minutes providing this consultation,  from 11: 30am to 11:45am. More than 50% of the time in this consultation was spent coordinating communication.   HISTORY OF PRESENT ILLNESS:  Dawn Bradford is a 83 y.o. female with multiple medical problems including end-stage renal disease; hemodialysis Monday-Wednesday- Friday; diastolic heart failure, coronary artery disease s/p cabg, myocardial infarction,aortic  valve disorder, COPD, CVA, right artery stenosis with occluded right renal artery, right pulmonary nodule, obstructive sleep apnea, obesity, hypoventilation syndrome, hypertension, hyperlipidemia, diabetes, hypothyroidism, asthma. Palliative Care was asked to help address goals of care; she is seen for follow up visit today. Dawn Bradford recently transferred from [facility] to [facility]. She is currently receiving occupational, physical and speech therapy. She requires assistance with all adl's. She states she has been ambulating short distances with therapy. She reports mild pain to right hip/leg today after participating in therapy; she declines pain medication at this time. She reports shortness of breath with exertion; she is wearing oxygen via nasal canula at 2 liters per minute continuously. She denies nausea; does report constipation today. Last bowel movement two days ago. She is sleeping well at night. She reports a fair to good appetite; states she is eating at least 50% of her meals. No recent emergency room visits. She denies any other needs.   CODE STATUS: Full Code  PPS: 40% HOSPICE ELIGIBILITY/DIAGNOSIS: TBD  PAST MEDICAL HISTORY:  Past Medical History:  Diagnosis Date  . ACE-inhibitor cough   . Aortic valve disorder    Echo in 2009 showed mean aortic valve gradient of 13 mmHg, suggesting very mild stenosis. Echo (12/10) suggested aortic sclerosis only  . Carotid artery disease (HCC)    mild, carotid dopplers 12/2009  . CHF (congestive heart failure) ([place])   . COPD (chronic obstructive pulmonary disease) ([place])   . Coronary artery disease    s/p anterior MI in 1996 followed by CABG. Lexiscan myoview (4/11): EF 67%, normal perfusion with no evidence for ischemia or infarction.   . Diabetes mellitus    type 2  . Diastolic heart failure  most recent echo (12/10) showed EF 55-60% with mild LVH, grade I diastolic dysfunction, mild LAE.  [name] ESRD (end stage renal disease) on dialysis  ([place])   . Hyperlipidemia   . Hypertension    resistant hyptertension times many years. the patient does have renal artery stenosis. she has tried calcium channel blockers in the past and states that she would not take them now because she had some problems with her gums which her dentist identified as calcium-channel blocker side effects.  . Hypothyroidism   . Mild asthma    PFT 02/05/10 FEV1 1.38, FEV1% 73, TLC 3.78 (86%), DLCO 48%, +BD  . Myocardial infarction ([place])   . Obesity   . Obesity hypoventilation syndrome ([place])       . Obstructive sleep apnea    PSG 01/05/2006 AHI 24.8, CPAP 9cm H2O  . Pulmonary nodule, right   . Renal artery stenosis [facility])    The patient has an occluded right renal artery and an atrophic right kidney. There is 20% left renal artery stenosis. This was seen by catheterization in 2007  . Stroke [facility])     SOCIAL HX:  Social History   Tobacco Use  . Smoking status: Never Smoker  . Smokeless tobacco: Never Used  Substance Use Topics  . Alcohol use: No    ALLERGIES:  Allergies  Allergen Reactions  . Ace Inhibitors Other (See Comments)    Reaction:  Unknown   . Amlodipine Other (See Comments)    Reaction:  Unknown   . Clonidine Hydrochloride Other (See Comments)    Reaction:  Unknown   . Codeine Hives  . Latex Hives  . Metformin Nausea And Vomiting  . Tape Rash     PERTINENT MEDICATIONS:  Outpatient Encounter Medications as of 11/20/2018  Medication Sig  . acetaminophen (TYLENOL) 325 MG tablet Take 2 tablets (650 mg total) by mouth every 6 (six) hours as needed for mild pain, fever or headache (or Fever >/= 101).  [name] albuterol (PROVENTIL HFA;VENTOLIN HFA) 108 (90 Base) MCG/ACT inhaler Inhale 2 puffs into the lungs every 6 (six) hours as needed for wheezing or shortness of breath.  . Amino Acids-Protein Hydrolys (FEEDING SUPPLEMENT, PRO-STAT SUGAR FREE 64,) LIQD Take 30 mLs by mouth 2 (two) times daily.  [name] b complex-vitamin c-folic acid (NEPHRO-VITE) 0.8  MG TABS tablet Take 1 tablet by mouth daily.  . carvedilol (COREG) 25 MG tablet Take 25 mg by mouth 2 (two) times daily.   . Cholecalciferol (VITAMIN D3) 5000 UNITS TABS Take 5,000 Units by mouth daily.  . ferrous fumarate-iron polysaccharide complex (TANDEM) 162-115.2 MG CAPS capsule Take 1 capsule by mouth daily.  [name] FLUoxetine (PROZAC) 40 MG capsule Take 40 mg by mouth daily.  . Fluticasone-Salmeterol (ADVAIR) 100-50 MCG/DOSE AEPB Inhale 1 puff into the lungs 2 (two) times daily.  . furosemide (LASIX) 20 MG tablet Take 20 mg by mouth once a week. Give on Thursday  . furosemide (LASIX) 80 MG tablet Give 1 tablet by mouth daily on Sunday, Tuesday and Saturday  . hydrALAZINE (APRESOLINE) 50 MG tablet Take 50 mg by mouth 2 (two) times daily.   [name] ipratropium (ATROVENT) 0.02 % nebulizer solution Take 0.5 mg by nebulization every 6 (six) hours as needed for wheezing or shortness of breath.  . isosorbide mononitrate (IMDUR) 30 MG 24 hr tablet Take 30 mg by mouth at bedtime.   [name] levothyroxine (SYNTHROID, LEVOTHROID) 75 MCG tablet Take 75 mcg by mouth daily.   [name] lidocaine-prilocaine (EMLA)  cream Apply 1 application topically as needed (for pain).  . methocarbamol (ROBAXIN) 500 MG tablet Take 500 mg by mouth every 8 (eight) hours as needed for muscle spasms.  . midodrine (PROAMATINE) 5 MG tablet Take 1 tablet (5 mg total) by mouth 2 (two) times daily with a meal.  . nystatin (MYCOSTATIN/NYSTOP) 100000 UNIT/GM POWD Apply 1 g topically as needed (for irritation).   [name] omeprazole (PRILOSEC) 10 MG capsule Take 10 mg by mouth daily.  . ondansetron (ZOFRAN) 4 MG tablet Take 4 mg by mouth 2 (two) times daily as needed for nausea or vomiting.  . OXYGEN Inhale 2 L/min into the lungs continuous.  . rosuvastatin (CRESTOR) 40 MG tablet Take 40 mg by mouth every evening.   Orlie Dakin Sodium (SENNA PLUS) 8.6-50 MG CAPS Take 2 capsules by mouth 2 (two) times daily as needed.   [name] telmisartan (MICARDIS) 80 MG  tablet Take 80 mg by mouth daily. At night  . traMADol (ULTRAM) 50 MG tablet Take 50 mg by mouth every 6 (six) hours as needed.  . traZODone (DESYREL) 100 MG tablet Take 100 mg by mouth at bedtime.   . triamcinolone (KENALOG) 0.5 % cream Apply 1 application topically as needed (for irritation).   [name] UNABLE TO FIND CPAP @@ Bedtime  . Infant Care Products [facility] EX) Apply liberal amount topically to area of skin irritation as needed.  OK to leave at bedside  . NON FORMULARY Diet Type:  Renal, NAS, NCS, 1200 cc fluid restriction   No facility-administered encounter medications on file as of 11/20/2018.     PHYSICAL EXAM:   General: NAD,  Cardiovascular: regular rate and rhythm Pulmonary: clear ant fields Abdomen: soft, nontender, + bowel sounds GU: no suprapubic tenderness Extremities: 2+ non-pitting edema, no joint deformities Skin: no rashes Neurological: Weakness but otherwise nonfocal  Ezekiel Slocumb, NP

## 2018-11-30 ENCOUNTER — Telehealth: Payer: Self-pay | Admitting: Student

## 2018-11-30 NOTE — Telephone Encounter (Signed)
Palliative NP left message for daughter [name] to follow up on patient.

## 2018-12-25 ENCOUNTER — Encounter (INDEPENDENT_AMBULATORY_CARE_PROVIDER_SITE_OTHER): Payer: Medicare Other

## 2018-12-25 ENCOUNTER — Ambulatory Visit (INDEPENDENT_AMBULATORY_CARE_PROVIDER_SITE_OTHER): Payer: Medicare Other | Admitting: Nurse Practitioner

## 2019-01-30 ENCOUNTER — Telehealth: Payer: Self-pay

## 2019-01-30 NOTE — Telephone Encounter (Signed)
Called patients daughter.  She stated patient is currently in a nursing home closer towards [place], [place].  She state that at this time she is under the care of a physician there and she will keep [place] in mind if they believe patient needs a telehealth visit.  At this time patients daughter declined.

## 2019-02-05 ENCOUNTER — Other Ambulatory Visit: Payer: Self-pay

## 2019-02-05 ENCOUNTER — Ambulatory Visit (INDEPENDENT_AMBULATORY_CARE_PROVIDER_SITE_OTHER): Payer: Medicare Other

## 2019-02-05 ENCOUNTER — Encounter (INDEPENDENT_AMBULATORY_CARE_PROVIDER_SITE_OTHER): Payer: Self-pay | Admitting: Nurse Practitioner

## 2019-02-05 ENCOUNTER — Ambulatory Visit (INDEPENDENT_AMBULATORY_CARE_PROVIDER_SITE_OTHER): Payer: Medicare Other | Admitting: Nurse Practitioner

## 2019-02-05 ENCOUNTER — Telehealth (INDEPENDENT_AMBULATORY_CARE_PROVIDER_SITE_OTHER): Payer: Self-pay

## 2019-02-05 VITALS — BP 129/60 | HR 66 | Resp 16 | Ht 63.0 in

## 2019-02-05 DIAGNOSIS — I25119 Atherosclerotic heart disease of native coronary artery with unspecified angina pectoris: Secondary | ICD-10-CM

## 2019-02-05 DIAGNOSIS — N186 End stage renal disease: Secondary | ICD-10-CM

## 2019-02-05 DIAGNOSIS — Z992 Dependence on renal dialysis: Secondary | ICD-10-CM

## 2019-02-05 DIAGNOSIS — E1169 Type 2 diabetes mellitus with other specified complication: Secondary | ICD-10-CM

## 2019-02-05 DIAGNOSIS — E1122 Type 2 diabetes mellitus with diabetic chronic kidney disease: Secondary | ICD-10-CM | POA: Diagnosis not present

## 2019-02-05 DIAGNOSIS — E785 Hyperlipidemia, unspecified: Secondary | ICD-10-CM

## 2019-02-05 NOTE — Telephone Encounter (Signed)
I attempted to contact the patient's daughter to schedule the patient for a procedure with Dr. Lucky Cowboy. A message was left for a return call.

## 2019-02-10 ENCOUNTER — Encounter (INDEPENDENT_AMBULATORY_CARE_PROVIDER_SITE_OTHER): Payer: Self-pay | Admitting: Nurse Practitioner

## 2019-02-10 NOTE — Progress Notes (Signed)
SUBJECTIVE:  Patient ID: Dawn Bradford, female    DOB: Nov 13, 1935, 83 y.o.   MRN: [id number] Chief Complaint  Patient presents with  . Follow-up    ultrasound    HPI  Dawn Bradford is a 83 y.o. female The patient returns to the office for follow up regarding problem with the dialysis access. Currently the patient is maintained via a radio-cephalic AVF.  The patient has had multiple failed upper extremity accesses.  The patient notes a significant increase in bleeding time after decannulation.  The patient has also been informed that there is increased recirculation.    The patient denies hand pain or other symptoms consistent with steal phenomena.  No significant arm swelling.  The patient denies redness or swelling at the access site. The patient denies fever or chills at home or while on dialysis.  The patient denies amaurosis fugax or recent TIA symptoms. There are no recent neurological changes noted. The patient denies claudication symptoms or rest pain symptoms. The patient denies history of DVT, PE or superficial thrombophlebitis. The patient denies recent episodes of angina or shortness of breath.   The patient has a flow volume of 340 with significant change in velocity from the anastomosis to the proximal forearm.     Past Medical History:  Diagnosis Date  . ACE-inhibitor cough   . Aortic valve disorder    Echo in 2009 showed mean aortic valve gradient of 13 mmHg, suggesting very mild stenosis. Echo (12/10) suggested aortic sclerosis only  . Carotid artery disease (HCC)    mild, carotid dopplers 12/2009  . CHF (congestive heart failure) ([place])   . COPD (chronic obstructive pulmonary disease) ([place])   . Coronary artery disease    s/p anterior MI in 1996 followed by CABG. Lexiscan myoview (4/11): EF 67%, normal perfusion with no evidence for ischemia or infarction.   . Diabetes mellitus    type 2  . Diastolic heart failure    most recent echo (12/10) showed EF 55-60% with  mild LVH, grade I diastolic dysfunction, mild LAE.  [name] ESRD (end stage renal disease) on dialysis ([place])   . Hyperlipidemia   . Hypertension    resistant hyptertension times many years. the patient does have renal artery stenosis. she has tried calcium channel blockers in the past and states that she would not take them now because she had some problems with her gums which her dentist identified as calcium-channel blocker side effects.  . Hypothyroidism   . Mild asthma    PFT 02/05/10 FEV1 1.38, FEV1% 73, TLC 3.78 (86%), DLCO 48%, +BD  . Myocardial infarction ([place])   . Obesity   . Obesity hypoventilation syndrome ([place])       . Obstructive sleep apnea    PSG 01/05/2006 AHI 24.8, CPAP 9cm H2O  . Pulmonary nodule, right   . Renal artery stenosis [facility])    The patient has an occluded right renal artery and an atrophic right kidney. There is 20% left renal artery stenosis. This was seen by catheterization in 2007  . Stroke [facility])     Past Surgical History:  Procedure Laterality Date  . A/V FISTULAGRAM N/A 01/25/2018   Procedure: A/V FISTULAGRAM;  Surgeon: Algernon Huxley, MD;  Location: [place] [facility];  Service: Cardiovascular;  Laterality: N/A;  . ABDOMINAL HYSTERECTOMY  1985  . AV FISTULA PLACEMENT    . CATARACT EXTRACTION  2010  . CORONARY ARTERY BYPASS GRAFT  1996  . DIALYSIS FISTULA CREATION    .  [place]   right  . INTRAMEDULLARY (IM) NAIL INTERTROCHANTERIC Right 08/17/2018   Procedure: INTRAMEDULLARY (IM) NAIL INTERTROCHANTRIC;  Surgeon: Earnestine Leys, MD;  Location: [facility] ORS;  Service: Orthopedics;  Laterality: Right;  . PERIPHERAL VASCULAR CATHETERIZATION Left 01/06/2015   Procedure: A/V Shuntogram/Fistulagram;  Surgeon: Katha Cabal, MD;  Location: [place] [facility];  Service: Cardiovascular;  Laterality: Left;  . PERIPHERAL VASCULAR CATHETERIZATION N/A 02/17/2015   Procedure: Dialysis/Perma Catheter Removal;  Surgeon: Katha Cabal, MD;  Location: [place] [facility];  Service: Cardiovascular;  Laterality: N/A;  . PERIPHERAL VASCULAR CATHETERIZATION N/A 01/28/2016   Procedure: A/V Shuntogram/Fistulagram;  Surgeon: Algernon Huxley, MD;  Location: [place] [facility];  Service: Cardiovascular;  Laterality: N/A;  . PERIPHERAL VASCULAR CATHETERIZATION N/A 01/28/2016   Procedure: A/V Shunt Intervention;  Surgeon: Algernon Huxley, MD;  Location: [place] [facility];  Service: Cardiovascular;  Laterality: N/A;  . TUBAL LIGATION  1973    Social History   Socioeconomic History  . Marital status: Married    Spouse name: Not on file  . Number of children: Not on file  . Years of education: Not on file  . Highest education level: Not on file  Occupational History  . Not on file  Social Needs  . Financial resource strain: Not on file  . Food insecurity:    Worry: Not on file    Inability: Not on file  . Transportation needs:    Medical: Not on file    Non-medical: Not on file  Tobacco Use  . Smoking status: Never Smoker  . Smokeless tobacco: Never Used  Substance and Sexual Activity  . Alcohol use: No  . Drug use: No  . Sexual activity: Not on file  Lifestyle  . Physical activity:    Days per week: Not on file    Minutes per session: Not on file  . Stress: Not on file  Relationships  . Social connections:    Talks on phone: Not on file    Gets together: Not on file    Attends religious service: Not on file    Active member of club or organization: Not on file    Attends meetings of clubs or organizations: Not on file    Relationship status: Not on file  . Intimate partner violence:    Fear of current or ex partner: Not on file    Emotionally abused: Not on file    Physically abused: Not on file    Forced sexual activity: Not on file  Other Topics Concern  . Not on file  Social History Narrative  . Not on file    Family History  Problem Relation Age of Onset  . Kidney failure Mother   . Diabetes Mother   . Aortic aneurysm  Father   . Coronary artery disease Father   . Heart attack Father   . Diabetes Sister   . Diabetes Brother   . Kidney failure Brother     Allergies  Allergen Reactions  . Ace Inhibitors Other (See Comments)    Reaction:  Unknown   . Amlodipine Other (See Comments)    Reaction:  Unknown   . Clonidine Hydrochloride Other (See Comments)    Reaction:  Unknown   . Codeine Hives  . Latex Hives  . Metformin Nausea And Vomiting  . Tape Rash     Review of Systems   Review of Systems: Negative Unless Checked Constitutional: [] Weight  loss  [] Fever  [] Chills Cardiac: [] Chest pain   []  Atrial Fibrillation  [] Palpitations   [] Shortness of breath when laying flat   [] Shortness of breath with exertion. [] Shortness of breath at rest Vascular:  [] Pain in legs with walking   [] Pain in legs with standing [] Pain in legs when laying flat   [] Claudication    [] Pain in feet when laying flat    [] History of DVT   [] Phlebitis   [] Swelling in legs   [] Varicose veins   [] Non-healing ulcers Pulmonary:   [] Uses home oxygen   [] Productive cough   [] Hemoptysis   [] Wheeze  [x] COPD   [] Asthma Neurologic:  [] Dizziness   [] Seizures  [] Blackouts [x] History of stroke   [] History of TIA  [] Aphasia   [] Temporary Blindness   [] Weakness or numbness in arm   [] Weakness or numbness in leg Musculoskeletal:   [] Joint swelling   [] Joint pain   [] Low back pain  []  History of Knee Replacement [] Arthritis [] back Surgeries  []  Spinal Stenosis    Hematologic:  [] Easy bruising  [] Easy bleeding   [] Hypercoagulable state   [] Anemic Gastrointestinal:  [] Diarrhea   [] Vomiting  [] Gastroesophageal reflux/heartburn   [] Difficulty swallowing. [] Abdominal pain Genitourinary:  [x] Chronic kidney disease   [] Difficult urination  [] Anuric   [] Blood in urine [] Frequent urination  [] Burning with urination   [] Hematuria Skin:  [] Rashes   [] Ulcers [] Wounds Psychological:  [] History of anxiety   []  History of major depression  [x]  Memory Difficulties       OBJECTIVE:   Physical Exam  BP 129/60   Pulse 66   Resp 16   Ht 5\' 3"  (1.6 m)   LMP 02/01/1986 (Approximate)   BMI 36.05 kg/m   Gen: WD/WN, NAD Head: [place]/AT, No temporalis wasting.  Ear/Nose/Throat: Hearing grossly intact, nares w/o erythema or drainage Eyes: PER, EOMI, sclera nonicteric.  Neck: Supple, no masses.  No JVD.  Pulmonary:  Good air movement, no use of accessory muscles.  Cardiac: RRR Vascular: soft thrill and bruit Vessel Right Left  Radial Palpable Palpable   Gastrointestinal: soft, non-distended. No guarding/no peritoneal signs.  Musculoskeletal: Uses walker for ambulation.  No deformity or atrophy.  Neurologic: Pain and light touch intact in extremities.  Symmetrical.  Speech is fluent. Motor exam as listed above. Psychiatric: Judgment intact, Mood & affect appropriate for pt's clinical situation. Dermatologic: No Venous rashes. No Ulcers Noted.  No changes consistent with cellulitis. Lymph : No Cervical lymphadenopathy, no lichenification or skin changes of chronic lymphedema.       ASSESSMENT AND PLAN:  1. ESRD on dialysis [facility]) Recommend:  The patient is experiencing increasing problems with their dialysis access.  Patient should have a fistulagram with the intention for intervention.  The intention for intervention is to restore appropriate flow and prevent thrombosis and possible loss of the access.  As well as improve the quality of dialysis therapy.  The risks, benefits and alternative therapies were reviewed in detail with the patient and daughter.  All questions were answered.  The patient agrees to proceed with angio/intervention.       2. Dyslipidemia associated with type 2 diabetes mellitus ([place]) Continue hypoglycemic medications as already ordered, these medications have been reviewed and there are no changes at this time.  Hgb A1C to be monitored as already arranged by primary service   3. Hyperlipidemia, unspecified  hyperlipidemia type Continue statin as ordered and reviewed, no changes at this time    Current Outpatient Medications on File Prior to Visit  Medication Sig Dispense Refill  . acetaminophen (TYLENOL) 325 MG tablet Take 2 tablets (650 mg total) by mouth every 6 (six) hours as needed for mild pain, fever or headache (or Fever >/= 101). 30 tablet 0  . albuterol (PROVENTIL HFA;VENTOLIN HFA) 108 (90 Base) MCG/ACT inhaler Inhale 2 puffs into the lungs every 6 (six) hours as needed for wheezing or shortness of breath.    . Amino Acids-Protein Hydrolys (FEEDING SUPPLEMENT, PRO-STAT SUGAR FREE 64,) LIQD Take 30 mLs by mouth 2 (two) times daily.    [name] b complex-vitamin c-folic acid (NEPHRO-VITE) 0.8 MG TABS tablet Take 1 tablet by mouth daily.    . carvedilol (COREG) 25 MG tablet Take 25 mg by mouth 2 (two) times daily.     . Cholecalciferol (VITAMIN D3) 5000 UNITS TABS Take 5,000 Units by mouth daily.    . ferrous fumarate-iron polysaccharide complex (TANDEM) 162-115.2 MG CAPS capsule Take 1 capsule by mouth daily.    [name] FLUoxetine (PROZAC) 40 MG capsule Take 40 mg by mouth daily.    . Fluticasone-Salmeterol (ADVAIR) 100-50 MCG/DOSE AEPB Inhale 1 puff into the lungs 2 (two) times daily.    . furosemide (LASIX) 20 MG tablet Take 20 mg by mouth once a week. Give on Thursday    . furosemide (LASIX) 80 MG tablet Give 1 tablet by mouth daily on Sunday, Tuesday and Saturday    . hydrALAZINE (APRESOLINE) 50 MG tablet Take 50 mg by mouth 2 (two) times daily.   1  . Infant Care Products [facility] EX) Apply liberal amount topically to area of skin irritation as needed.  OK to leave at bedside    . ipratropium (ATROVENT) 0.02 % nebulizer solution Take 0.5 mg by nebulization every 6 (six) hours as needed for wheezing or shortness of breath.    . isosorbide mononitrate (IMDUR) 30 MG 24 hr tablet Take 30 mg by mouth at bedtime.   0  . levothyroxine (SYNTHROID, LEVOTHROID) 75 MCG tablet Take 75 mcg by mouth daily.      [name] lidocaine-prilocaine (EMLA) cream Apply 1 application topically as needed (for pain).    . methocarbamol (ROBAXIN) 500 MG tablet Take 500 mg by mouth every 8 (eight) hours as needed for muscle spasms.    . midodrine (PROAMATINE) 5 MG tablet Take 1 tablet (5 mg total) by mouth 2 (two) times daily with a meal. 30 tablet 0  . NON FORMULARY Diet Type:  Renal, NAS, NCS, 1200 cc fluid restriction    . nystatin (MYCOSTATIN/NYSTOP) 100000 UNIT/GM POWD Apply 1 g topically as needed (for irritation).     [name] omeprazole (PRILOSEC) 10 MG capsule Take 10 mg by mouth daily.    . ondansetron (ZOFRAN) 4 MG tablet Take 4 mg by mouth 2 (two) times daily as needed for nausea or vomiting.    . OXYGEN Inhale 2 L/min into the lungs continuous.    . rosuvastatin (CRESTOR) 40 MG tablet Take 40 mg by mouth every evening.     Orlie Dakin Sodium (SENNA PLUS) 8.6-50 MG CAPS Take 2 capsules by mouth 2 (two) times daily as needed.     [name] telmisartan (MICARDIS) 80 MG tablet Take 80 mg by mouth daily. At night    . traMADol (ULTRAM) 50 MG tablet Take 50 mg by mouth every 6 (six) hours as needed.    . traZODone (DESYREL) 100 MG tablet Take 100 mg by mouth at bedtime.     . triamcinolone (KENALOG) 0.5 % cream Apply 1  application topically as needed (for irritation).     [name] UNABLE TO FIND CPAP @@ Bedtime     No current facility-administered medications on file prior to visit.     There are no Patient Instructions on file for this visit. No follow-ups on file.   Kris Hartmann, NP  This note was completed with Sales executive.  Any errors are purely unintentional.

## 2019-02-14 ENCOUNTER — Encounter (INDEPENDENT_AMBULATORY_CARE_PROVIDER_SITE_OTHER): Payer: Self-pay

## 2019-02-14 NOTE — Telephone Encounter (Signed)
I have attempted to contact the patient and her daughter regarding getting her scheduled for her procedure and a message was left with both for a return call.

## 2019-02-14 NOTE — Telephone Encounter (Signed)
Patient has been scheduled with Dr. Lucky Cowboy for 02/21/2019 with a 8:00 am arrival time for Covid-19 testing. I have spoken with both daughters regarding this procedure. The pre-procedure information will be mailed out to the patient's home and has been faxed to [facility] as well.

## 2019-02-15 ENCOUNTER — Other Ambulatory Visit (INDEPENDENT_AMBULATORY_CARE_PROVIDER_SITE_OTHER): Payer: Self-pay | Admitting: Nurse Practitioner

## 2019-02-19 NOTE — Telephone Encounter (Signed)
Patient's sister called on 02/14/2019 and was given the information regarding the patient's procedure with  Dr. Lucky Cowboy on 02/21/2019. I have talked with both siblings at different times and days regarding the patient. Both are aware of the patient's procedure.

## 2019-02-21 ENCOUNTER — Encounter
Admission: RE | Disposition: A | Discharge status: Skilled Nursing Facility | Payer: Self-pay | Source: Home / Self Care | Attending: Vascular Surgery

## 2019-02-21 ENCOUNTER — Ambulatory Visit
Admission: RE | Admit: 2019-02-21 | Discharge: 2019-02-21 | Disposition: A | Discharge status: Skilled Nursing Facility | Payer: Medicare Other | Attending: Vascular Surgery | Admitting: Vascular Surgery

## 2019-02-21 ENCOUNTER — Other Ambulatory Visit
Admission: RE | Admit: 2019-02-21 | Discharge: 2019-02-21 | Disposition: A | Discharge status: Home / Self Care | Payer: Medicare Other | Source: Ambulatory Visit | Attending: Vascular Surgery | Admitting: Vascular Surgery

## 2019-02-21 ENCOUNTER — Other Ambulatory Visit: Payer: Self-pay

## 2019-02-21 ENCOUNTER — Encounter: Payer: Self-pay | Admitting: *Deleted

## 2019-02-21 DIAGNOSIS — E1122 Type 2 diabetes mellitus with diabetic chronic kidney disease: Secondary | ICD-10-CM | POA: Diagnosis not present

## 2019-02-21 DIAGNOSIS — Z951 Presence of aortocoronary bypass graft: Secondary | ICD-10-CM | POA: Diagnosis not present

## 2019-02-21 DIAGNOSIS — I252 Old myocardial infarction: Secondary | ICD-10-CM | POA: Insufficient documentation

## 2019-02-21 DIAGNOSIS — E039 Hypothyroidism, unspecified: Secondary | ICD-10-CM | POA: Insufficient documentation

## 2019-02-21 DIAGNOSIS — G4733 Obstructive sleep apnea (adult) (pediatric): Secondary | ICD-10-CM | POA: Insufficient documentation

## 2019-02-21 DIAGNOSIS — N186 End stage renal disease: Secondary | ICD-10-CM | POA: Diagnosis not present

## 2019-02-21 DIAGNOSIS — Z9981 Dependence on supplemental oxygen: Secondary | ICD-10-CM | POA: Diagnosis not present

## 2019-02-21 DIAGNOSIS — I2510 Atherosclerotic heart disease of native coronary artery without angina pectoris: Secondary | ICD-10-CM | POA: Diagnosis not present

## 2019-02-21 DIAGNOSIS — Z90710 Acquired absence of both cervix and uterus: Secondary | ICD-10-CM | POA: Insufficient documentation

## 2019-02-21 DIAGNOSIS — Z91040 Latex allergy status: Secondary | ICD-10-CM | POA: Insufficient documentation

## 2019-02-21 DIAGNOSIS — Y832 Surgical operation with anastomosis, bypass or graft as the cause of abnormal reaction of the patient, or of later complication, without mention of misadventure at the time of the procedure: Secondary | ICD-10-CM | POA: Diagnosis not present

## 2019-02-21 DIAGNOSIS — I5030 Unspecified diastolic (congestive) heart failure: Secondary | ICD-10-CM | POA: Diagnosis not present

## 2019-02-21 DIAGNOSIS — E1169 Type 2 diabetes mellitus with other specified complication: Secondary | ICD-10-CM | POA: Insufficient documentation

## 2019-02-21 DIAGNOSIS — Z992 Dependence on renal dialysis: Secondary | ICD-10-CM | POA: Insufficient documentation

## 2019-02-21 DIAGNOSIS — E669 Obesity, unspecified: Secondary | ICD-10-CM | POA: Diagnosis not present

## 2019-02-21 DIAGNOSIS — Z1159 Encounter for screening for other viral diseases: Secondary | ICD-10-CM | POA: Diagnosis not present

## 2019-02-21 DIAGNOSIS — Z79899 Other long term (current) drug therapy: Secondary | ICD-10-CM | POA: Diagnosis not present

## 2019-02-21 DIAGNOSIS — I132 Hypertensive heart and chronic kidney disease with heart failure and with stage 5 chronic kidney disease, or end stage renal disease: Secondary | ICD-10-CM | POA: Insufficient documentation

## 2019-02-21 DIAGNOSIS — Z888 Allergy status to other drugs, medicaments and biological substances status: Secondary | ICD-10-CM | POA: Insufficient documentation

## 2019-02-21 DIAGNOSIS — Z841 Family history of disorders of kidney and ureter: Secondary | ICD-10-CM | POA: Diagnosis not present

## 2019-02-21 DIAGNOSIS — Z6826 Body mass index (BMI) 26.0-26.9, adult: Secondary | ICD-10-CM | POA: Insufficient documentation

## 2019-02-21 DIAGNOSIS — E785 Hyperlipidemia, unspecified: Secondary | ICD-10-CM | POA: Insufficient documentation

## 2019-02-21 DIAGNOSIS — T82858A Stenosis of vascular prosthetic devices, implants and grafts, initial encounter: Secondary | ICD-10-CM | POA: Insufficient documentation

## 2019-02-21 DIAGNOSIS — J449 Chronic obstructive pulmonary disease, unspecified: Secondary | ICD-10-CM | POA: Insufficient documentation

## 2019-02-21 DIAGNOSIS — Z79890 Hormone replacement therapy (postmenopausal): Secondary | ICD-10-CM | POA: Diagnosis not present

## 2019-02-21 DIAGNOSIS — Z8673 Personal history of transient ischemic attack (TIA), and cerebral infarction without residual deficits: Secondary | ICD-10-CM | POA: Insufficient documentation

## 2019-02-21 DIAGNOSIS — Z8249 Family history of ischemic heart disease and other diseases of the circulatory system: Secondary | ICD-10-CM | POA: Insufficient documentation

## 2019-02-21 DIAGNOSIS — Z833 Family history of diabetes mellitus: Secondary | ICD-10-CM | POA: Insufficient documentation

## 2019-02-21 HISTORY — PX: A/V FISTULAGRAM: CATH118298

## 2019-02-21 HISTORY — PX: PERIPHERAL VASCULAR BALLOON ANGIOPLASTY: CATH118281

## 2019-02-21 LAB — GLUCOSE, CAPILLARY
Glucose-Capillary: 113 mg/dL — ABNORMAL HIGH (ref 70–99)
Glucose-Capillary: 93 mg/dL (ref 70–99)

## 2019-02-21 LAB — SARS CORONAVIRUS 2 BY RT PCR (HOSPITAL ORDER, PERFORMED IN ~~LOC~~ HOSPITAL LAB): SARS Coronavirus 2: NEGATIVE

## 2019-02-21 LAB — POTASSIUM (ARMC VASCULAR LAB ONLY): Potassium (ARMC vascular lab): 3.4 — ABNORMAL LOW (ref 3.5–5.1)

## 2019-02-21 SURGERY — A/V FISTULAGRAM
Anesthesia: Moderate Sedation | Laterality: Left

## 2019-02-21 MED ORDER — DIPHENHYDRAMINE HCL 50 MG/ML IJ SOLN: 50.0000 mg | Freq: Once | INTRAMUSCULAR | Status: DC | PRN

## 2019-02-21 MED ORDER — FENTANYL CITRATE (PF) 100 MCG/2ML IJ SOLN
INTRAMUSCULAR | Status: DC | PRN
  Administered 2019-02-21 (×2): 25 ug via INTRAVENOUS

## 2019-02-21 MED ORDER — HEPARIN SODIUM (PORCINE) 1000 UNIT/ML IJ SOLN
INTRAMUSCULAR | Status: DC | PRN
  Administered 2019-02-21: 3000 [IU] via INTRAVENOUS

## 2019-02-21 MED ORDER — CEFAZOLIN SODIUM-DEXTROSE 2-4 GM/100ML-% IV SOLN: 2.0000 g | Freq: Once | INTRAVENOUS | Status: DC

## 2019-02-21 MED ORDER — METHYLPREDNISOLONE SODIUM SUCC 125 MG IJ SOLR: 125.0000 mg | Freq: Once | INTRAMUSCULAR | Status: DC | PRN

## 2019-02-21 MED ORDER — FAMOTIDINE 20 MG PO TABS: 40.0000 mg | ORAL_TABLET | Freq: Once | ORAL | Status: DC | PRN

## 2019-02-21 MED ORDER — IOHEXOL 300 MG/ML  SOLN
INTRAMUSCULAR | Status: DC | PRN
  Administered 2019-02-21: 30 mL

## 2019-02-21 MED ORDER — HEPARIN SODIUM (PORCINE) 1000 UNIT/ML IJ SOLN
INTRAMUSCULAR | Status: AC
  Filled 2019-02-21: qty 1

## 2019-02-21 MED ORDER — CEFAZOLIN SODIUM-DEXTROSE 1-4 GM/50ML-% IV SOLN
1.0000 g | INTRAVENOUS | Status: AC
  Administered 2019-02-21: 1 g via INTRAVENOUS

## 2019-02-21 MED ORDER — SODIUM CHLORIDE 0.9 % IV SOLN
INTRAVENOUS | Status: DC
  Administered 2019-02-21: 10:00:00 via INTRAVENOUS

## 2019-02-21 MED ORDER — MIDAZOLAM HCL 2 MG/2ML IJ SOLN
INTRAMUSCULAR | Status: AC
  Filled 2019-02-21: qty 2

## 2019-02-21 MED ORDER — MIDAZOLAM HCL 2 MG/ML PO SYRP: 8.0000 mg | ORAL_SOLUTION | Freq: Once | ORAL | Status: DC | PRN

## 2019-02-21 MED ORDER — LIDOCAINE-EPINEPHRINE (PF) 1 %-1:200000 IJ SOLN
INTRAMUSCULAR | Status: AC
  Filled 2019-02-21: qty 30

## 2019-02-21 MED ORDER — FENTANYL CITRATE (PF) 100 MCG/2ML IJ SOLN
INTRAMUSCULAR | Status: AC
  Filled 2019-02-21: qty 2

## 2019-02-21 MED ORDER — MIDAZOLAM HCL 2 MG/2ML IJ SOLN
INTRAMUSCULAR | Status: DC | PRN
  Administered 2019-02-21 (×2): 1 mg via INTRAVENOUS

## 2019-02-21 SURGICAL SUPPLY — 10 items
BALLN LUTONIX DCB 5X60X130 (BALLOONS) ×4
BALLOON LUTONIX DCB 5X60X130 (BALLOONS) ×2 IMPLANT
CANNULA 5F STIFF (CANNULA) ×4 IMPLANT
CATH BEACON 5 .035 40 KMP TP (CATHETERS) ×2 IMPLANT
CATH BEACON 5 .038 40 KMP TP (CATHETERS) ×2
DEVICE PRESTO INFLATION (MISCELLANEOUS) ×4 IMPLANT
DRAPE BRACHIAL (DRAPES) ×4 IMPLANT
PACK ANGIOGRAPHY (CUSTOM PROCEDURE TRAY) ×4 IMPLANT
SHEATH BRITE TIP 6FRX5.5 (SHEATH) ×4 IMPLANT
WIRE MAGIC TOR.035 180C (WIRE) ×4 IMPLANT

## 2019-02-21 NOTE — Progress Notes (Signed)
Daughter [name] at [phone] updated via phone per Dr. Lucky Cowboy.

## 2019-02-21 NOTE — Op Note (Signed)
[place] [facility]    OPERATIVE NOTE   PROCEDURE: 1.   Left radiocephalic arteriovenous fistula cannulation under ultrasound guidance 2.   Left arm fistulagram including central venogram 3.   Catheter placement to proximal left brachial artery with left upper extremity angiogram 4.   Percutaneous transluminal angioplasty of the radiocephalic anastomosis with 5 mm diameter Lutonix drug-coated angioplasty balloon  PRE-OPERATIVE DIAGNOSIS: 1. ESRD 2. Poorly functional left radiocephalic AVF  POST-OPERATIVE DIAGNOSIS: same as above   SURGEON: Leotis Pain, MD  ANESTHESIA: local with MCS  ESTIMATED BLOOD LOSS: 3 cc  FINDING(S): 1. Moderate hyperplastic stenosis of the radiocephalic anastomosis creating about a 60% luminal narrowing.  The radial artery was otherwise widely patent all the way up to the origin at the brachial bifurcation.  The previously placed stent in the forearm cephalic vein was patent.  The access site was aneurysmal without stenosis.  There was dual outflow in the upper arm with the basilic vein providing the majority of the outflow.  No central venous stenosis was identified.  SPECIMEN(S):  None  CONTRAST: 30 cc  FLUORO TIME: 2.9 minutes  MODERATE CONSCIOUS SEDATION TIME: Approximately 20 minutes with 2 mg of Versed and 50 mcg of Fentanyl   INDICATIONS: Dawn Bradford is a 83 y.o. female who presents with malfunctioning left raciocephalic arteriovenous fistula.  The patient is scheduled for left arm fistulagram.  The patient is aware the risks include but are not limited to: bleeding, infection, thrombosis of the cannulated access, and possible anaphylactic reaction to the contrast.  The patient is aware of the risks of the procedure and elects to proceed forward.  DESCRIPTION: After full informed written consent was obtained, the patient was brought back to the angiography suite and placed supine upon the angiography table.  The patient was connected  to monitoring equipment. Moderate conscious sedation was administered with a face to face encounter with the patient throughout the procedure with my supervision of the RN administering medicines and monitoring the patient's vital signs and mental status throughout from the start of the procedure until the patient was taken to the recovery room. The left arm was prepped and draped in the standard fashion for a percutaneous access intervention.  Under ultrasound guidance, the area at the antecubital fossa of the left radiocephalic arteriovenous fistula was cannulated with a micropuncture needle under direct ultrasound guidance in a retrograde fashion where it was patent and a permanent image was performed.  The microwire was advanced into the fistula and the needle was exchanged for the a microsheath.  I then upsized to a 6 Fr Sheath and then used a Kumpe catheter and a Magic torque wire to navigate down the forearm and across the radiocephalic anastomosis.  As the low flow rate identified on duplex suggest there could be a possible inflow problem, I advanced the catheter up to the origin of the radial artery to evaluate the brachial bifurcation in the radial artery feeding the fistula.  Imaging was performed.  Hand injections were completed to image the access including the central venous system. This demonstrated moderate hyperplastic stenosis of the radiocephalic anastomosis creating about a 60% luminal narrowing.  The radial artery was otherwise widely patent all the way up to the origin at the brachial bifurcation.  The previously placed stent in the forearm cephalic vein was patent.  The access site was aneurysmal without stenosis.  There was dual outflow in the upper arm with the basilic vein providing the majority of  the outflow.  No central venous stenosis was identified.  Based on the images, this patient will need intervention to the radiocephalic anastomosis. I then gave the patient 3000 units of  intravenous heparin.  I then crossed the stenosis with a Magic Tourqe wire.  Based on the imaging, a 5 mm x 6 cm Lutonix drug-coated angioplasty balloon was selected.  The balloon was centered around the anastomotic stenosis with the first 1 to 2 cm in the radial artery and the rest of the balloon in the cephalic vein and inflated to 10 ATM for 1 minute(s).  On completion imaging, a 20-25 % residual stenosis was present.     Based on the completion imaging, no further intervention is necessary.  The wire and balloon were removed from the sheath.  A 4-0 Monocryl purse-string suture was sewn around the sheath.  The sheath was removed while tying down the suture.  A sterile bandage was applied to the puncture site.  COMPLICATIONS: None  CONDITION: Stable   Leotis Pain  02/21/2019 11:57 AM   This note was created with Dragon Medical transcription system. Any errors in dictation are purely unintentional.

## 2019-02-21 NOTE — Progress Notes (Signed)
Spoke with leanne at [employer] and healthcare and she states patient had pm snack yesterday and nothing else since by mouth except meds

## 2019-02-21 NOTE — H&P (Signed)
[place] [facility] History & Physical Update  The patient was interviewed and re-examined.  The patient's previous History and Physical has been reviewed and is unchanged.  There is no change in the plan of care. We plan to proceed with the scheduled procedure.  Leotis Pain, MD  02/21/2019, 8:33 AM

## 2019-02-22 ENCOUNTER — Encounter: Payer: Self-pay | Admitting: Vascular Surgery

## 2019-04-02 ENCOUNTER — Encounter (INDEPENDENT_AMBULATORY_CARE_PROVIDER_SITE_OTHER): Payer: Self-pay | Admitting: Vascular Surgery

## 2019-04-02 ENCOUNTER — Encounter (INDEPENDENT_AMBULATORY_CARE_PROVIDER_SITE_OTHER): Payer: Self-pay

## 2019-04-02 ENCOUNTER — Other Ambulatory Visit: Payer: Self-pay

## 2019-04-02 ENCOUNTER — Ambulatory Visit (INDEPENDENT_AMBULATORY_CARE_PROVIDER_SITE_OTHER): Payer: Medicare Other

## 2019-04-02 ENCOUNTER — Ambulatory Visit (INDEPENDENT_AMBULATORY_CARE_PROVIDER_SITE_OTHER): Payer: Medicare Other | Admitting: Vascular Surgery

## 2019-04-02 ENCOUNTER — Other Ambulatory Visit (INDEPENDENT_AMBULATORY_CARE_PROVIDER_SITE_OTHER): Payer: Self-pay | Admitting: Vascular Surgery

## 2019-04-02 VITALS — BP 116/75 | HR 76 | Resp 12 | Ht 63.0 in | Wt 150.0 lb

## 2019-04-02 DIAGNOSIS — I10 Essential (primary) hypertension: Secondary | ICD-10-CM

## 2019-04-02 DIAGNOSIS — I25119 Atherosclerotic heart disease of native coronary artery with unspecified angina pectoris: Secondary | ICD-10-CM | POA: Diagnosis not present

## 2019-04-02 DIAGNOSIS — Z79899 Other long term (current) drug therapy: Secondary | ICD-10-CM

## 2019-04-02 DIAGNOSIS — N186 End stage renal disease: Secondary | ICD-10-CM

## 2019-04-02 DIAGNOSIS — I129 Hypertensive chronic kidney disease with stage 1 through stage 4 chronic kidney disease, or unspecified chronic kidney disease: Secondary | ICD-10-CM | POA: Diagnosis not present

## 2019-04-02 DIAGNOSIS — Z9862 Peripheral vascular angioplasty status: Secondary | ICD-10-CM

## 2019-04-02 DIAGNOSIS — Z992 Dependence on renal dialysis: Secondary | ICD-10-CM | POA: Diagnosis not present

## 2019-04-02 DIAGNOSIS — E1122 Type 2 diabetes mellitus with diabetic chronic kidney disease: Secondary | ICD-10-CM

## 2019-04-02 DIAGNOSIS — E785 Hyperlipidemia, unspecified: Secondary | ICD-10-CM

## 2019-04-02 NOTE — Progress Notes (Signed)
MRN : [id number]  Dawn Bradford is a 83 y.o. (April 02, 1936) female who presents with chief complaint of  Chief Complaint  Patient presents with  . Follow-up  .  History of Present Illness: Patient returns today in follow up of her dialysis access.  She is doing well and after her intervention about 6 weeks ago her fistula seems to be running better with less issues.  No significant pain or prolonged bleeding with dialysis.  Her duplex today shows relatively low velocities throughout most of the outflow veins but no obvious stenosis or vessel narrowing is seen in her left radiocephalic AV fistula or outflow vein stent.  Current Outpatient Medications  Medication Sig Dispense Refill  . acetaminophen (TYLENOL) 325 MG tablet Take 2 tablets (650 mg total) by mouth every 6 (six) hours as needed for mild pain, fever or headache (or Fever >/= 101). 30 tablet 0  . albuterol (PROVENTIL HFA;VENTOLIN HFA) 108 (90 Base) MCG/ACT inhaler Inhale 2 puffs into the lungs every 6 (six) hours as needed for wheezing or shortness of breath.    . carvedilol (COREG) 25 MG tablet Take 25 mg by mouth 2 (two) times daily.     . Cholecalciferol (VITAMIN D3) 5000 UNITS TABS Take 5,000 Units by mouth daily.    . ferrous sulfate 325 (65 FE) MG tablet Take 325 mg by mouth daily with breakfast.    . FLUoxetine (PROZAC) 40 MG capsule Take 50 mg by mouth daily.     . Fluticasone-Salmeterol (ADVAIR) 100-50 MCG/DOSE AEPB Inhale 1 puff into the lungs 2 (two) times daily.    . furosemide (LASIX) 20 MG tablet Take 20 mg by mouth once a week. Give on Thursday    . furosemide (LASIX) 80 MG tablet Give 1 tablet by mouth daily on Sunday, Tuesday and Saturday    . ipratropium (ATROVENT) 0.02 % nebulizer solution Take 0.5 mg by nebulization every 6 (six) hours as needed for wheezing or shortness of breath.    . isosorbide mononitrate (IMDUR) 30 MG 24 hr tablet Take 30 mg by mouth at bedtime.   0  . levothyroxine (SYNTHROID, LEVOTHROID) 75  MCG tablet Take 75 mcg by mouth daily.     [name] lidocaine-prilocaine (EMLA) cream Apply 1 application topically as needed (for pain).    . Melatonin 5 MG TABS Take 5 mg by mouth at bedtime.    . methocarbamol (ROBAXIN) 500 MG tablet Take 500 mg by mouth every 8 (eight) hours as needed for muscle spasms.    . midodrine (PROAMATINE) 5 MG tablet Take 1 tablet (5 mg total) by mouth 2 (two) times daily with a meal. 30 tablet 0  . nystatin (MYCOSTATIN/NYSTOP) 100000 UNIT/GM POWD Apply 1 g topically as needed (for irritation).     [name] omeprazole (PRILOSEC) 10 MG capsule Take 10 mg by mouth daily.    . ondansetron (ZOFRAN) 4 MG tablet Take 4 mg by mouth 2 (two) times daily as needed for nausea or vomiting.    . rosuvastatin (CRESTOR) 40 MG tablet Take 40 mg by mouth every evening.     Orlie Dakin Sodium (SENNA PLUS) 8.6-50 MG CAPS Take 2 capsules by mouth 2 (two) times daily as needed.     [name] telmisartan (MICARDIS) 80 MG tablet Take 80 mg by mouth daily. At night    . traMADol (ULTRAM) 50 MG tablet Take 50 mg by mouth every 6 (six) hours as needed.    . traZODone (DESYREL) 100 MG tablet  Take 100 mg by mouth at bedtime.     . triamcinolone (KENALOG) 0.5 % cream Apply 1 application topically as needed (for irritation).     . Amino Acids-Protein Hydrolys (FEEDING SUPPLEMENT, PRO-STAT SUGAR FREE 64,) LIQD Take 30 mLs by mouth 2 (two) times daily.    [name] b complex-vitamin c-folic acid (NEPHRO-VITE) 0.8 MG TABS tablet Take 1 tablet by mouth daily.    . ferrous fumarate-iron polysaccharide complex (TANDEM) 162-115.2 MG CAPS capsule Take 1 capsule by mouth daily.    . hydrALAZINE (APRESOLINE) 50 MG tablet Take 50 mg by mouth 2 (two) times daily.   1  . Infant Care Products [facility] EX) Apply liberal amount topically to area of skin irritation as needed.  OK to leave at bedside    . NON FORMULARY Diet Type:  Renal, NAS, NCS, 1200 cc fluid restriction    . OXYGEN Inhale 2 L/min into the lungs continuous.     [name] UNABLE TO FIND CPAP @@ Bedtime     No current facility-administered medications for this visit.     Past Medical History:  Diagnosis Date  . ACE-inhibitor cough   . Aortic valve disorder    Echo in 2009 showed mean aortic valve gradient of 13 mmHg, suggesting very mild stenosis. Echo (12/10) suggested aortic sclerosis only  . Carotid artery disease (HCC)    mild, carotid dopplers 12/2009  . CHF (congestive heart failure) ([place])   . COPD (chronic obstructive pulmonary disease) ([place])   . Coronary artery disease    s/p anterior MI in 1996 followed by CABG. Lexiscan myoview (4/11): EF 67%, normal perfusion with no evidence for ischemia or infarction.   . Diabetes mellitus    type 2  . Diastolic heart failure    most recent echo (12/10) showed EF 55-60% with mild LVH, grade I diastolic dysfunction, mild LAE.  [name] ESRD (end stage renal disease) on dialysis ([place])   . Hyperlipidemia   . Hypertension    resistant hyptertension times many years. the patient does have renal artery stenosis. she has tried calcium channel blockers in the past and states that she would not take them now because she had some problems with her gums which her dentist identified as calcium-channel blocker side effects.  . Hypothyroidism   . Mild asthma    PFT 02/05/10 FEV1 1.38, FEV1% 73, TLC 3.78 (86%), DLCO 48%, +BD  . Myocardial infarction ([place])   . Obesity   . Obesity hypoventilation syndrome ([place])       . Obstructive sleep apnea    PSG 01/05/2006 AHI 24.8, CPAP 9cm H2O  . Pulmonary nodule, right   . Renal artery stenosis [facility])    The patient has an occluded right renal artery and an atrophic right kidney. There is 20% left renal artery stenosis. This was seen by catheterization in 2007  . Stroke [facility])     Past Surgical History:  Procedure Laterality Date  . A/V FISTULAGRAM N/A 01/25/2018   Procedure: A/V FISTULAGRAM;  Surgeon: Algernon Huxley, MD;  Location: [place] [facility];  Service: Cardiovascular;   Laterality: N/A;  . A/V FISTULAGRAM Left 02/21/2019   Procedure: A/V FISTULAGRAM;  Surgeon: Algernon Huxley, MD;  Location: [place] [facility];  Service: Cardiovascular;  Laterality: Left;  . ABDOMINAL HYSTERECTOMY  1985  . AV FISTULA PLACEMENT    . CATARACT EXTRACTION  2010  . CORONARY ARTERY BYPASS GRAFT  1996  . DIALYSIS FISTULA CREATION    . INNER  [place]   right  . INTRAMEDULLARY (IM) NAIL INTERTROCHANTERIC Right 08/17/2018   Procedure: INTRAMEDULLARY (IM) NAIL INTERTROCHANTRIC;  Surgeon: Earnestine Leys, MD;  Location: [facility] ORS;  Service: Orthopedics;  Laterality: Right;  . PERIPHERAL VASCULAR BALLOON ANGIOPLASTY  02/21/2019   Procedure: PERIPHERAL VASCULAR BALLOON ANGIOPLASTY;  Surgeon: Algernon Huxley, MD;  Location: [place] [facility];  Service: Cardiovascular;;  . PERIPHERAL VASCULAR CATHETERIZATION Left 01/06/2015   Procedure: A/V Shuntogram/Fistulagram;  Surgeon: Katha Cabal, MD;  Location: [place] [facility];  Service: Cardiovascular;  Laterality: Left;  . PERIPHERAL VASCULAR CATHETERIZATION N/A 02/17/2015   Procedure: Dialysis/Perma Catheter Removal;  Surgeon: Katha Cabal, MD;  Location: [place] [facility];  Service: Cardiovascular;  Laterality: N/A;  . PERIPHERAL VASCULAR CATHETERIZATION N/A 01/28/2016   Procedure: A/V Shuntogram/Fistulagram;  Surgeon: Algernon Huxley, MD;  Location: [place] [facility];  Service: Cardiovascular;  Laterality: N/A;  . PERIPHERAL VASCULAR CATHETERIZATION N/A 01/28/2016   Procedure: A/V Shunt Intervention;  Surgeon: Algernon Huxley, MD;  Location: [place] [facility];  Service: Cardiovascular;  Laterality: N/A;  . TUBAL LIGATION  1973    Social History Social History   Tobacco Use  . Smoking status: Never Smoker  . Smokeless tobacco: Never Used  Substance Use Topics  . Alcohol use: No  . Drug use: No     Family History Family History  Problem Relation Age of Onset  . Kidney failure Mother   . Diabetes Mother   . Aortic  aneurysm Father   . Coronary artery disease Father   . Heart attack Father   . Diabetes Sister   . Diabetes Brother   . Kidney failure Brother     Allergies  Allergen Reactions  . Ace Inhibitors Other (See Comments)    Reaction:  Unknown   . Amlodipine Other (See Comments)    Reaction:  Unknown   . Clonidine Hydrochloride Other (See Comments)    Reaction:  Unknown   . Codeine Hives  . Latex Hives  . Metformin Nausea And Vomiting  . Tape Rash    REVIEW OF SYSTEMS (Negative unless checked)  Constitutional: [] ?Weight loss  [] ?Fever  [] ?Chills Cardiac: [] ?Chest pain   [] ?Chest pressure   [] ?Palpitations   [] ?Shortness of breath when laying flat   [x] ?Shortness of breath at rest   [x] ?Shortness of breath with exertion. Vascular:  [] ?Pain in legs with walking   [] ?Pain in legs at rest   [] ?Pain in legs when laying flat   [] ?Claudication   [] ?Pain in feet when walking  [] ?Pain in feet at rest  [] ?Pain in feet when laying flat   [] ?History of DVT   [] ?Phlebitis   [] ?Swelling in legs   [] ?Varicose veins   [] ?Non-healing ulcers Pulmonary:   [] ?Uses home oxygen   [] ?Productive cough   [] ?Hemoptysis   [] ?Wheeze  [x] ?COPD   [x] ?Asthma Neurologic:  [] ?Dizziness  [] ?Blackouts   [] ?Seizures   [x] ?History of stroke   [] ?History of TIA  [] ?Aphasia   [] ?Temporary blindness   [] ?Dysphagia   [] ?Weakness or numbness in arms   [] ?Weakness or numbness in legs Musculoskeletal:  [x] ?Arthritis   [] ?Joint swelling   [] ?Joint pain   [] ?Low back pain Hematologic:  [] ?Easy bruising  [] ?Easy bleeding   [] ?Hypercoagulable state   [x] ?Anemic   Gastrointestinal:  [] ?Blood in stool   [] ?Vomiting blood  [] ?Gastroesophageal reflux/heartburn   [] ?Abdominal pain Genitourinary:  [x] ?Chronic kidney disease   [] ?Difficult urination  [] ?Frequent urination  [] ?Burning with urination   [] ?  Hematuria Skin:  [] ?Rashes   [] ?Ulcers   [] ?Wounds Psychological:  [] ?History of anxiety   [] ? History of major depression.    Physical  Examination  BP 116/75 (BP Location: Right Wrist, Patient Position: Sitting, Cuff Size: Normal)   Pulse 76   Resp 12   Ht 5\' 3"  (1.6 m)   Wt 150 lb (68 kg)   LMP 02/01/1986 (Approximate)   BMI 26.57 kg/m  Gen:  WD/WN, NAD Head: [place]/AT, No temporalis wasting. Ear/Nose/Throat: Hearing grossly intact, nares w/o erythema or drainage Eyes: Conjunctiva clear. Sclera non-icteric Neck: Supple.  Trachea midline Pulmonary:  Good air movement, no use of accessory muscles.  Cardiac: RRR, no JVD Vascular: Soft thrill is present in left radiocephalic AV fistula Vessel Right Left  Radial Palpable Palpable                           Musculoskeletal: M/S 5/5 throughout.  No deformity or atrophy.  In a wheelchair.  Mild bilateral lower extremity edema. Neurologic: Sensation grossly intact in extremities.  Symmetrical.  Speech is fluent.  Psychiatric: Judgment intact, Mood & affect appropriate for pt's clinical situation. Dermatologic: No rashes or ulcers noted.  No cellulitis or open wounds.       Labs Recent Results (from the past 2160 hour(s))  SARS Coronavirus 2 (CEPHEID - Performed in [place] hospital lab), Hosp Order     Status: None   Collection Time: 02/21/19  8:18 AM   Specimen: Nasopharyngeal Swab  Result Value Ref Range   SARS Coronavirus 2 NEGATIVE NEGATIVE    Comment: (NOTE) If result is NEGATIVE SARS-CoV-2 target nucleic acids are NOT DETECTED. The SARS-CoV-2 RNA is generally detectable in upper and lower  respiratory specimens during the acute phase of infection. The lowest  concentration of SARS-CoV-2 viral copies this assay can detect is 250  copies / mL. A negative result does not preclude SARS-CoV-2 infection  and should not be used as the sole basis for treatment or other  patient management decisions.  A negative result may occur with  improper specimen collection / handling, submission of specimen other  than nasopharyngeal swab, presence of viral  mutation(s) within the  areas targeted by this assay, and inadequate number of viral copies  (<250 copies / mL). A negative result must be combined with clinical  observations, patient history, and epidemiological information. If result is POSITIVE SARS-CoV-2 target nucleic acids are DETECTED. The SARS-CoV-2 RNA is generally detectable in upper and lower  respiratory specimens dur ing the acute phase of infection.  Positive  results are indicative of active infection with SARS-CoV-2.  Clinical  correlation with patient history and other diagnostic information is  necessary to determine patient infection status.  Positive results do  not rule out bacterial infection or co-infection with other viruses. If result is PRESUMPTIVE POSTIVE SARS-CoV-2 nucleic acids MAY BE PRESENT.   A presumptive positive result was obtained on the submitted specimen  and confirmed on repeat testing.  While 2019 novel coronavirus  (SARS-CoV-2) nucleic acids may be present in the submitted sample  additional confirmatory testing may be necessary for epidemiological  and / or clinical management purposes  to differentiate between  SARS-CoV-2 and other Sarbecovirus currently known to infect humans.  If clinically indicated additional testing with an alternate test  methodology [phone]) is advised. The SARS-CoV-2 RNA is generally  detectable in upper and lower respiratory sp ecimens during the acute  phase of infection. The  expected result is Negative. Fact Sheet for Patients:  StrictlyIdeas.no Fact Sheet for Healthcare Providers: BankingDealers.co.za This test is not yet approved or cleared by the [place] FDA and has been authorized for detection and/or diagnosis of SARS-CoV-2 by FDA under an Emergency Use Authorization (EUA).  This EUA will remain in effect (meaning this test can be used) for the duration of the COVID-19 declaration under Section 564(b)(1)  of the Act, 21 U.S.C. section 360bbb-3(b)(1), unless the authorization is terminated or revoked sooner. Performed at [facility], [address] [place], [place] [address]   Glucose, capillary     Status: Abnormal   Collection Time: 02/21/19  9:57 AM  Result Value Ref Range   Glucose-Capillary 113 (H) 70 - 99 mg/dL  Potassium [facility] vascular lab only)     Status: Abnormal   Collection Time: 02/21/19 10:13 AM  Result Value Ref Range   Potassium [facility] vascular lab) 3.4 (L) 3.5 - 5.1    Comment: Performed at [facility], [address] [place], [place] [address]  Glucose, capillary     Status: None   Collection Time: 02/21/19 12:31 PM  Result Value Ref Range   Glucose-Capillary 93 70 - 99 mg/dL    Radiology No results found.  Assessment/Plan Hyperlipidemia lipid control important in reducing the progression of atherosclerotic disease. Continue statin therapy   Essential hypertension Likely an underlying cause of renal failure and blood pressure control important in reducing the progression of atherosclerotic disease. On appropriate oral medications.  Chronic kidney disease with end stage renal disease on dialysis due to type 2 diabetes mellitus (HCC) blood glucose control important in reducing the progression of atherosclerotic disease. Also, involved in wound healing. On appropriate medications.   ESRD on dialysis [facility]) Her duplex today shows relatively low velocities throughout most of the outflow veins but no obvious stenosis or vessel narrowing is seen in her left radiocephalic AV fistula or outflow vein stent.  At this point, I would try to rotate the areas that they are sticking her for dialysis but otherwise they can continue using her fistula for dialysis.  No role for intervention at this level.  Plan to recheck in 6 months with noninvasive studies or sooner if problems develop in the interim    Leotis Pain, MD  04/02/2019 3:37 PM    This  note was created with Dragon medical transcription system.  Any errors from dictation are purely unintentional

## 2019-04-02 NOTE — Assessment & Plan Note (Signed)
blood glucose control important in reducing the progression of atherosclerotic disease. Also, involved in wound healing. On appropriate medications.  

## 2019-04-02 NOTE — Assessment & Plan Note (Signed)
Her duplex today shows relatively low velocities throughout most of the outflow veins but no obvious stenosis or vessel narrowing is seen in her left radiocephalic AV fistula or outflow vein stent.  At this point, I would try to rotate the areas that they are sticking her for dialysis but otherwise they can continue using her fistula for dialysis.  No role for intervention at this level.  Plan to recheck in 6 months with noninvasive studies or sooner if problems develop in the interim

## 2019-04-16 ENCOUNTER — Non-Acute Institutional Stay: Payer: Medicare Other | Admitting: Student

## 2019-04-16 ENCOUNTER — Other Ambulatory Visit: Payer: Self-pay

## 2019-04-16 DIAGNOSIS — Z515 Encounter for palliative care: Secondary | ICD-10-CM

## 2019-04-16 NOTE — Progress Notes (Signed)
[place] Consult Note Telephone: [phone]  Fax: [phone]  PATIENT NAME: Dawn Bradford DOB: 06/17/1936 MRN: [id number]  PRIMARY CARE PROVIDER:   Hortencia Pilar, MD  REFERRING PROVIDER:  Hortencia Pilar, [address] [place],  [place] [address]  RESPONSIBLE PARTY: Daughter, [name] at [phone]   ASSESSMENT:  Due to the COVID-19 crisis, this visit was done via telemedicine and it was initiated and consent by this patient and or family. Dawn Bradford is resting in bed; no acute distress noted. We discussed role of Palliative Medicine. We discussed symptom management. Palliative Medicine will provide ongoing support and make recommendations as needed. ADON states that patient will likely stay at facility for long-term.        RECOMMENDATIONS and PLAN:  1. Code Status: Full Code. 2. Medical goals of therapy: Dawn Bradford will continue Hemodialysis on Mon-Wed-Friday. Palliative Medicine will monitor for changes/declines and make recommendations as needed. 3. Symptom management: Pain-continue tramadol 50mg  every 6 hours prn, acetaminophen 650mg  every 6 hours prn. Dyspnea-continue oxygen via nasal canula at 2 lpm, inhalers as directed, CPAP at bedtime.  4. Discharge Planning: Dawn Bradford will continue to reside at [facility].   Palliative will follow up in 8 weeks or sooner, if needed.   I spent 15 minutes providing this consultation,  from 3:15pm to 3:30pm. More than 50% of the time in this consultation was spent coordinating communication.   HISTORY OF PRESENT ILLNESS:  Dawn Bradford is a 83 y.o. female with multiple medical problems including end-stage renal disease; hemodialysis Monday-Wednesday- Friday; diastolic heart failure, coronary artery disease s/p cabg, myocardial infarction,aortic valve disorder, COPD, CVA, right artery stenosis with occluded right renal artery, right pulmonary nodule, obstructive sleep apnea, obesity,  hypoventilation syndrome, hypertension, hyperlipidemia, diabetes, hypothyroidism, asthma. Dawn Bradford had operative procedure on 6/18 due to poorly functioning left radiocephalic AVF. Palliative Care was asked to help address goals of care; she is seen for follow up visit today. Dawn Bradford reports doing okay. She does report a headache today. She denies pain otherwise. She denies shortness of breath at rest; she has shortness of breath with exertion. She wears oxygen continuously via nasal canula at 2 liters per minute. She requires assist x 1 for transfers out of bed to wheel chair. She reports a good appetite. She is sleeping well. No medication changes reported. No recent ER visits or hospitalizations.   CODE STATUS: Full Code    PPS: 40% HOSPICE ELIGIBILITY/DIAGNOSIS: TBD  PAST MEDICAL HISTORY:  Past Medical History:  Diagnosis Date  . ACE-inhibitor cough   . Aortic valve disorder    Echo in 2009 showed mean aortic valve gradient of 13 mmHg, suggesting very mild stenosis. Echo (12/10) suggested aortic sclerosis only  . Carotid artery disease (HCC)    mild, carotid dopplers 12/2009  . CHF (congestive heart failure) ([place])   . COPD (chronic obstructive pulmonary disease) ([place])   . Coronary artery disease    s/p anterior MI in 1996 followed by CABG. Lexiscan myoview (4/11): EF 67%, normal perfusion with no evidence for ischemia or infarction.   . Diabetes mellitus    type 2  . Diastolic heart failure    most recent echo (12/10) showed EF 55-60% with mild LVH, grade I diastolic dysfunction, mild LAE.  [name] ESRD (end stage renal disease) on dialysis ([place])   . Hyperlipidemia   . Hypertension    resistant hyptertension times many years. the patient does have  renal artery stenosis. she has tried calcium channel blockers in the past and states that she would not take them now because she had some problems with her gums which her dentist identified as calcium-channel blocker side effects.  . Hypothyroidism    . Mild asthma    PFT 02/05/10 FEV1 1.38, FEV1% 73, TLC 3.78 (86%), DLCO 48%, +BD  . Myocardial infarction ([place])   . Obesity   . Obesity hypoventilation syndrome ([place])       . Obstructive sleep apnea    PSG 01/05/2006 AHI 24.8, CPAP 9cm H2O  . Pulmonary nodule, right   . Renal artery stenosis [facility])    The patient has an occluded right renal artery and an atrophic right kidney. There is 20% left renal artery stenosis. This was seen by catheterization in 2007  . Stroke [facility])     SOCIAL HX:  Social History   Tobacco Use  . Smoking status: Never Smoker  . Smokeless tobacco: Never Used  Substance Use Topics  . Alcohol use: No    ALLERGIES:  Allergies  Allergen Reactions  . Ace Inhibitors Other (See Comments)    Reaction:  Unknown   . Amlodipine Other (See Comments)    Reaction:  Unknown   . Clonidine Hydrochloride Other (See Comments)    Reaction:  Unknown   . Codeine Hives  . Latex Hives  . Metformin Nausea And Vomiting  . Tape Rash       PHYSICAL EXAM:   Physical exam deferred.   Ezekiel Slocumb, NP

## 2019-09-19 ENCOUNTER — Other Ambulatory Visit: Payer: Self-pay

## 2019-09-19 ENCOUNTER — Encounter: Payer: Self-pay | Admitting: Nurse Practitioner

## 2019-09-19 ENCOUNTER — Other Ambulatory Visit: Payer: Medicare Other | Admitting: Nurse Practitioner

## 2019-09-19 DIAGNOSIS — N186 End stage renal disease: Secondary | ICD-10-CM

## 2019-09-19 DIAGNOSIS — Z515 Encounter for palliative care: Secondary | ICD-10-CM

## 2019-09-19 NOTE — Progress Notes (Signed)
[place] Consult Note Telephone: [phone]  Fax: [phone]  PATIENT NAME: Dawn Bradford DOB: 05-29-1936 MRN: [id number]  PRIMARY CARE PROVIDER:   Hortencia Pilar, MD  REFERRING PROVIDER:  Hortencia Pilar, MD [place],  [place] [address]  RESPONSIBLE PARTY:     RECOMMENDATIONS and PLAN:  1. ACP: to remain a full code with aggressive interventions  I spent 65 minutes providing this consultation,  from 11:00am to 12:0pm. More than 50% of the time in this consultation was spent coordinating communication.   HISTORY OF PRESENT ILLNESS:  Dawn Bradford is a 84 y.o. year old female with multiple medical problems including end-stage renal disease; hemodialysis Monday-Wednesday-Friday;diastolic heart failure, coronary artery disease s/p cabg, myocardial infarction,aortic valve disorder, COPD, CVA, right artery stenosis with occluded right renal artery, right pulmonary nodule, obstructive sleep apnea, obesity,hypoventilation syndrome, hypertension, hyperlipidemia, diabetes, hypothyroidism, asthma. Dawn Bradford was residing at [facility] and has now transitioned home with her husband. Dawn Bradford does have caregivers in her daughters to continue the participate in her care. Dawn. Bradford does require assistance with mobility as she now is walking a few steps with a walker. Dawn. Bradford does require assistance with ADLs. Dawn Bradford does continue to go to hemodialysis and tolerating well. I called Dawn Bradford's daughter [name] for scheduled telemedicine telephonic follow up palliative care visit. [name] in agreement. We talked about how Dawn. Bentsen has been feeling. We talked about transition from [facility] to home. [name] Dawn. Bradford is doing well. [name] her sister Dawn Bradford was primary person in orchestrating the transition home and it is been a good change. Dawn. Bradford has been thriving in her environment. Appetite has been good. We  talked about medical goals of care with focus to remain on full code and aggressive interventions. We talked about chronic disease progression of dementia. We talked about some days are better with memory than others. We talked about the importance of rest and nutrition, restful sleep. [name] talked about family dynamics. [name] and I talked about calling Dawn Bradford's residence as she currently is not with [name] for update from caregiver also. We talked about role of palliative care and plan of care. [name] in agreement to continue to follow and will follow up in two months if needed or sooner should she declined. Appointment scheduled. I called Dawn Bradford's residence inspected caregiver. Dawn Bradford's having a good day today. Dawn. Bradford did ambulate. Dawn Bradford has eaten a good breakfast today. Dawn. Bradford denies pain, concern is shortness of breath. Dawn Bradford has no concerns or complaints nor caregiver. Therapeutic listening and emotional support provided. Questions answered the satisfaction. Contact information Palliative Care was asked to help to continue to address goals of care.   CODE STATUS: Full code  PPS: 50% HOSPICE ELIGIBILITY/DIAGNOSIS: TBD  PAST MEDICAL HISTORY:  Past Medical History:  Diagnosis Date  . ACE-inhibitor cough   . Aortic valve disorder    Echo in 2009 showed mean aortic valve gradient of 13 mmHg, suggesting very mild stenosis. Echo (12/10) suggested aortic sclerosis only  . Carotid artery disease (HCC)    mild, carotid dopplers 12/2009  . CHF (congestive heart failure) ([place])   . COPD (chronic obstructive pulmonary disease) ([place])   . Coronary artery disease    s/p anterior MI in 1996 followed by CABG. Lexiscan myoview (4/11): EF 67%, normal perfusion with no evidence for ischemia or infarction.   . Diabetes  mellitus    type 2  . Diastolic heart failure    most recent echo (12/10) showed EF 55-60% with mild LVH, grade I diastolic dysfunction, mild LAE.  [name] ESRD (end stage renal disease) on  dialysis ([place])   . Hyperlipidemia   . Hypertension    resistant hyptertension times many years. the patient does have renal artery stenosis. she has tried calcium channel blockers in the past and states that she would not take them now because she had some problems with her gums which her dentist identified as calcium-channel blocker side effects.  . Hypothyroidism   . Mild asthma    PFT 02/05/10 FEV1 1.38, FEV1% 73, TLC 3.78 (86%), DLCO 48%, +BD  . Myocardial infarction ([place])   . Obesity   . Obesity hypoventilation syndrome ([place])       . Obstructive sleep apnea    PSG 01/05/2006 AHI 24.8, CPAP 9cm H2O  . Pulmonary nodule, right   . Renal artery stenosis [facility])    The patient has an occluded right renal artery and an atrophic right kidney. There is 20% left renal artery stenosis. This was seen by catheterization in 2007  . Stroke [facility])     SOCIAL HX:  Social History   Tobacco Use  . Smoking status: Never Smoker  . Smokeless tobacco: Never Used  Substance Use Topics  . Alcohol use: No    ALLERGIES:  Allergies  Allergen Reactions  . Ace Inhibitors Other (See Comments)    Reaction:  Unknown   . Amlodipine Other (See Comments)    Reaction:  Unknown   . Clonidine Hydrochloride Other (See Comments)    Reaction:  Unknown   . Codeine Hives  . Latex Hives  . Metformin Nausea And Vomiting  . Tape Rash     PERTINENT MEDICATIONS:  Outpatient Encounter Medications as of 09/19/2019  Medication Sig  . acetaminophen (TYLENOL) 325 MG tablet Take 2 tablets (650 mg total) by mouth every 6 (six) hours as needed for mild pain, fever or headache (or Fever >/= 101).  [name] albuterol (PROVENTIL HFA;VENTOLIN HFA) 108 (90 Base) MCG/ACT inhaler Inhale 2 puffs into the lungs every 6 (six) hours as needed for wheezing or shortness of breath.  . Amino Acids-Protein Hydrolys (FEEDING SUPPLEMENT, PRO-STAT SUGAR FREE 64,) LIQD Take 30 mLs by mouth 2 (two) times daily.  [name] b complex-vitamin c-folic acid  (NEPHRO-VITE) 0.8 MG TABS tablet Take 1 tablet by mouth daily.  . carvedilol (COREG) 25 MG tablet Take 25 mg by mouth 2 (two) times daily.   . Cholecalciferol (VITAMIN D3) 5000 UNITS TABS Take 5,000 Units by mouth daily.  . ferrous fumarate-iron polysaccharide complex (TANDEM) 162-115.2 MG CAPS capsule Take 1 capsule by mouth daily.  . ferrous sulfate 325 (65 FE) MG tablet Take 325 mg by mouth daily with breakfast.  . FLUoxetine (PROZAC) 40 MG capsule Take 50 mg by mouth daily.   . Fluticasone-Salmeterol (ADVAIR) 100-50 MCG/DOSE AEPB Inhale 1 puff into the lungs 2 (two) times daily.  . furosemide (LASIX) 20 MG tablet Take 20 mg by mouth once a week. Give on Thursday  . furosemide (LASIX) 80 MG tablet Give 1 tablet by mouth daily on Sunday, Tuesday and Saturday  . hydrALAZINE (APRESOLINE) 50 MG tablet Take 50 mg by mouth 2 (two) times daily.   . Infant Care Products [facility] EX) Apply liberal amount topically to area of skin irritation as needed.  OK to leave at bedside  . ipratropium (ATROVENT) 0.02 %  nebulizer solution Take 0.5 mg by nebulization every 6 (six) hours as needed for wheezing or shortness of breath.  . isosorbide mononitrate (IMDUR) 30 MG 24 hr tablet Take 30 mg by mouth at bedtime.   [name] levothyroxine (SYNTHROID, LEVOTHROID) 75 MCG tablet Take 75 mcg by mouth daily.   [name] lidocaine-prilocaine (EMLA) cream Apply 1 application topically as needed (for pain).  . Melatonin 5 MG TABS Take 5 mg by mouth at bedtime.  . methocarbamol (ROBAXIN) 500 MG tablet Take 500 mg by mouth every 8 (eight) hours as needed for muscle spasms.  . midodrine (PROAMATINE) 5 MG tablet Take 1 tablet (5 mg total) by mouth 2 (two) times daily with a meal.  . NON FORMULARY Diet Type:  Renal, NAS, NCS, 1200 cc fluid restriction  . nystatin (MYCOSTATIN/NYSTOP) 100000 UNIT/GM POWD Apply 1 g topically as needed (for irritation).   [name] omeprazole (PRILOSEC) 10 MG capsule Take 10 mg by mouth daily.  . ondansetron  (ZOFRAN) 4 MG tablet Take 4 mg by mouth 2 (two) times daily as needed for nausea or vomiting.  . OXYGEN Inhale 2 L/min into the lungs continuous.  . rosuvastatin (CRESTOR) 40 MG tablet Take 40 mg by mouth every evening.   Orlie Dakin Sodium (SENNA PLUS) 8.6-50 MG CAPS Take 2 capsules by mouth 2 (two) times daily as needed.   [name] telmisartan (MICARDIS) 80 MG tablet Take 80 mg by mouth daily. At night  . traMADol (ULTRAM) 50 MG tablet Take 50 mg by mouth every 6 (six) hours as needed.  . traZODone (DESYREL) 100 MG tablet Take 100 mg by mouth at bedtime.   . triamcinolone (KENALOG) 0.5 % cream Apply 1 application topically as needed (for irritation).   [name] UNABLE TO FIND CPAP @@ Bedtime   No facility-administered encounter medications on file as of 09/19/2019.    PHYSICAL EXAM:   Deferred  Ayleen Mckinstry Z Mykiah Schmuck, NP

## 2019-10-08 ENCOUNTER — Encounter (INDEPENDENT_AMBULATORY_CARE_PROVIDER_SITE_OTHER): Payer: Medicare Other

## 2019-10-08 ENCOUNTER — Ambulatory Visit (INDEPENDENT_AMBULATORY_CARE_PROVIDER_SITE_OTHER): Payer: Medicare Other | Admitting: Nurse Practitioner

## 2019-10-09 ENCOUNTER — Emergency Department: Payer: Medicare Other

## 2019-10-09 ENCOUNTER — Other Ambulatory Visit: Payer: Self-pay

## 2019-10-09 ENCOUNTER — Emergency Department
Admission: EM | Admit: 2019-10-09 | Discharge: 2019-10-09 | Disposition: A | Discharge status: Home / Self Care | Payer: Medicare Other | Attending: Emergency Medicine | Admitting: Emergency Medicine

## 2019-10-09 DIAGNOSIS — Z8673 Personal history of transient ischemic attack (TIA), and cerebral infarction without residual deficits: Secondary | ICD-10-CM | POA: Insufficient documentation

## 2019-10-09 DIAGNOSIS — Z91040 Latex allergy status: Secondary | ICD-10-CM | POA: Insufficient documentation

## 2019-10-09 DIAGNOSIS — E1122 Type 2 diabetes mellitus with diabetic chronic kidney disease: Secondary | ICD-10-CM | POA: Insufficient documentation

## 2019-10-09 DIAGNOSIS — N186 End stage renal disease: Secondary | ICD-10-CM | POA: Diagnosis not present

## 2019-10-09 DIAGNOSIS — K5900 Constipation, unspecified: Secondary | ICD-10-CM | POA: Diagnosis present

## 2019-10-09 DIAGNOSIS — K5641 Fecal impaction: Secondary | ICD-10-CM | POA: Insufficient documentation

## 2019-10-09 DIAGNOSIS — J449 Chronic obstructive pulmonary disease, unspecified: Secondary | ICD-10-CM | POA: Diagnosis not present

## 2019-10-09 DIAGNOSIS — I5030 Unspecified diastolic (congestive) heart failure: Secondary | ICD-10-CM | POA: Insufficient documentation

## 2019-10-09 DIAGNOSIS — E039 Hypothyroidism, unspecified: Secondary | ICD-10-CM | POA: Insufficient documentation

## 2019-10-09 DIAGNOSIS — R339 Retention of urine, unspecified: Secondary | ICD-10-CM | POA: Diagnosis not present

## 2019-10-09 DIAGNOSIS — Z992 Dependence on renal dialysis: Secondary | ICD-10-CM | POA: Diagnosis not present

## 2019-10-09 DIAGNOSIS — I132 Hypertensive heart and chronic kidney disease with heart failure and with stage 5 chronic kidney disease, or end stage renal disease: Secondary | ICD-10-CM | POA: Diagnosis not present

## 2019-10-09 LAB — COMPREHENSIVE METABOLIC PANEL
ALT: 16 U/L (ref 0–44)
AST: 21 U/L (ref 15–41)
Albumin: 3.1 g/dL — ABNORMAL LOW (ref 3.5–5.0)
Alkaline Phosphatase: 87 U/L (ref 38–126)
Anion gap: 10 (ref 5–15)
BUN: 21 mg/dL (ref 8–23)
CO2: 25 mmol/L (ref 22–32)
Calcium: 8.5 mg/dL — ABNORMAL LOW (ref 8.9–10.3)
Chloride: 101 mmol/L (ref 98–111)
Creatinine, Ser: 2.57 mg/dL — ABNORMAL HIGH (ref 0.44–1.00)
GFR calc Af Amer: 20 mL/min — ABNORMAL LOW (ref >60)
GFR calc non Af Amer: 17 mL/min — ABNORMAL LOW (ref >60)
Glucose, Bld: 135 mg/dL — ABNORMAL HIGH (ref 70–99)
Potassium: 4.4 mmol/L (ref 3.5–5.1)
Sodium: 136 mmol/L (ref 135–145)
Total Bilirubin: 0.5 mg/dL (ref 0.3–1.2)
Total Protein: 6.6 g/dL (ref 6.5–8.1)

## 2019-10-09 LAB — URINALYSIS, COMPLETE (UACMP) WITH MICROSCOPIC
Bilirubin Urine: NEGATIVE
Glucose, UA: NEGATIVE mg/dL
Hgb urine dipstick: NEGATIVE
Ketones, ur: NEGATIVE mg/dL
Nitrite: NEGATIVE
Protein, ur: 100 mg/dL — AB
Specific Gravity, Urine: 1.011 (ref 1.005–1.030)
WBC, UA: >50 WBC/hpf — ABNORMAL HIGH (ref 0–5)
pH: 6 (ref 5.0–8.0)

## 2019-10-09 LAB — CBC
HCT: 33.3 % — ABNORMAL LOW (ref 36.0–46.0)
Hemoglobin: 10.7 g/dL — ABNORMAL LOW (ref 12.0–15.0)
MCH: 34.5 pg — ABNORMAL HIGH (ref 26.0–34.0)
MCHC: 32.1 g/dL (ref 30.0–36.0)
MCV: 107.4 fL — ABNORMAL HIGH (ref 80.0–100.0)
Platelets: 131 10*3/uL — ABNORMAL LOW (ref 150–400)
RBC: 3.1 MIL/uL — ABNORMAL LOW (ref 3.87–5.11)
RDW: 14.6 % (ref 11.5–15.5)
WBC: 8.5 10*3/uL (ref 4.0–10.5)
nRBC: 0 % (ref 0.0–0.2)

## 2019-10-09 LAB — LIPASE, BLOOD: Lipase: 20 U/L (ref 11–51)

## 2019-10-09 MED ORDER — CIPROFLOXACIN HCL 500 MG PO TABS
500.0000 mg | ORAL_TABLET | Freq: Once | ORAL | Status: AC
  Administered 2019-10-09: 22:00:00 500 mg via ORAL
  Filled 2019-10-09: qty 1

## 2019-10-09 MED ORDER — LIDOCAINE VISCOUS HCL 2 % MT SOLN
15.0000 mL | Freq: Once | OROMUCOSAL | Status: AC
  Administered 2019-10-09: 21:00:00 15 mL via OROMUCOSAL
  Filled 2019-10-09: qty 15

## 2019-10-09 MED ORDER — POLYETHYLENE GLYCOL 3350 17 G PO PACK: 17.0000 g | PACK | Freq: Every day | ORAL | 0 refills | Status: DC

## 2019-10-09 MED ORDER — CIPROFLOXACIN HCL 500 MG PO TABS: 500.0000 mg | ORAL_TABLET | Freq: Every morning | ORAL | 0 refills | Status: AC

## 2019-10-09 MED ORDER — SODIUM CHLORIDE 0.9% FLUSH: 3.0000 mL | Freq: Once | INTRAVENOUS | Status: DC

## 2019-10-09 MED ORDER — MINERAL OIL RE ENEM
1.0000 | ENEMA | Freq: Once | RECTAL | Status: AC
  Administered 2019-10-09: 1 via RECTAL

## 2019-10-09 NOTE — ED Triage Notes (Signed)
Pt c/o abd pain with constipation, states she is unsure when her last BM was, states at least 2 days.

## 2019-10-09 NOTE — ED Notes (Signed)
Per pt daughter, pt was recently seen by her PCP and dx with a UTI, and has been c/o abd pain, states she was given a laxative today by the caregiver.

## 2019-10-09 NOTE — ED Provider Notes (Signed)
[facility] Emergency Department Provider Note       Time seen: ----------------------------------------- 7:37 PM on 10/09/2019 -----------------------------------------   I have reviewed the triage vital signs and the nursing notes.  HISTORY   Chief Complaint Constipation    HPI Dawn Bradford is a 84 y.o. female with a history of carotid artery disease, CHF, COPD, diabetes, end-stage renal disease, hyperlipidemia, hypertension who presents to the ED for severe lower abdominal pain.  Reportedly she has been constipated, received a laxative earlier today but has not had a bowel movement yet.  Patient did have dialysis today but was hurting so badly that she could not finish her dialysis.  Reportedly she was recently treated for UTI and finished 5 days of antibiotics.  Past Medical History:  Diagnosis Date  . ACE-inhibitor cough   . Aortic valve disorder    Echo in 2009 showed mean aortic valve gradient of 13 mmHg, suggesting very mild stenosis. Echo (12/10) suggested aortic sclerosis only  . Carotid artery disease (HCC)    mild, carotid dopplers 12/2009  . CHF (congestive heart failure) ([place])   . COPD (chronic obstructive pulmonary disease) ([place])   . Coronary artery disease    s/p anterior MI in 1996 followed by CABG. Lexiscan myoview (4/11): EF 67%, normal perfusion with no evidence for ischemia or infarction.   . Diabetes mellitus    type 2  . Diastolic heart failure    most recent echo (12/10) showed EF 55-60% with mild LVH, grade I diastolic dysfunction, mild LAE.  [name] ESRD (end stage renal disease) on dialysis ([place])   . Hyperlipidemia   . Hypertension    resistant hyptertension times many years. the patient does have renal artery stenosis. she has tried calcium channel blockers in the past and states that she would not take them now because she had some problems with her gums which her dentist identified as calcium-channel blocker side effects.  .  Hypothyroidism   . Mild asthma    PFT 02/05/10 FEV1 1.38, FEV1% 73, TLC 3.78 (86%), DLCO 48%, +BD  . Myocardial infarction ([place])   . Obesity   . Obesity hypoventilation syndrome ([place])       . Obstructive sleep apnea    PSG 01/05/2006 AHI 24.8, CPAP 9cm H2O  . Pulmonary nodule, right   . Renal artery stenosis [facility])    The patient has an occluded right renal artery and an atrophic right kidney. There is 20% left renal artery stenosis. This was seen by catheterization in 2007  . Stroke [facility])     Patient Active Problem List   Diagnosis Date Noted  . Pain, generalized 09/12/2018  . Palliative care encounter 09/12/2018  . Anemia due to end stage renal disease ([place]) 09/05/2018  . Cellulitis of right hip 09/01/2018  . Hypertensive heart and renal disease with congestive heart failure and end stage renal disease ([place]) 08/23/2018  . COPD with asthma ([place]) 08/23/2018  . Acquired hypothyroidism 08/23/2018  . Dyslipidemia associated with type 2 diabetes mellitus ([place]) 08/23/2018  . Chronic kidney disease with end stage renal disease on dialysis due to type 2 diabetes mellitus ([place]) 08/23/2018  . Diabetes mellitus type 2 with retinopathy ([place]) 08/23/2018  . Controlled type 2 diabetes with renal manifestation 08/23/2018  . Chronic cerebrovascular accident (CVA) 08/23/2018  . Chronic constipation 08/23/2018  . Protein-calorie malnutrition, severe ([place]) 08/23/2018  . Closed comminuted intertrochanteric fracture of proximal end of right femur ([place]) 08/23/2018  . Closed intertrochanteric fracture  of hip, right, initial encounter ([place]) 08/11/2018  . Hypoxia 10/12/2017  . ESRD on dialysis ([place]) 07/07/2016  . Chest pain 08/19/2015  . Coronary artery disease involving native heart with angina pectoris ([place]) 08/18/2015  . Expressive aphasia 02/03/2015  . Carotid stenosis 02/03/2015  . Diabetes mellitus type 2 in obese ([place]) 01/31/2015  . End stage renal disease ([place]) 01/31/2015  . Mild intermittent  asthma 03/29/2010  . CAD, ARTERY BYPASS GRAFT 03/02/2010  . Obesity hypoventilation syndrome ([place]) 02/05/2010  . Chronic depression 01/15/2010  . Shortness of breath 12/24/2009  . CAROTID BRUIT 12/14/2009  . DIASTOLIC HEART FAILURE, CHRONIC 02/17/2009  . DIABETES MELLITUS, TYPE II 01/29/2009  . Hyperlipidemia 01/29/2009  . Obstructive sleep apnea 01/29/2009  . Essential hypertension 01/29/2009  . Aortic valve disorder 01/29/2009  . RENAL ARTERY STENOSIS 01/29/2009  . RENAL DISEASE, CHRONIC 01/29/2009    Past Surgical History:  Procedure Laterality Date  . A/V FISTULAGRAM N/A 01/25/2018   Procedure: A/V FISTULAGRAM;  Surgeon: Algernon Huxley, MD;  Location: [place] [facility];  Service: Cardiovascular;  Laterality: N/A;  . A/V FISTULAGRAM Left 02/21/2019   Procedure: A/V FISTULAGRAM;  Surgeon: Algernon Huxley, MD;  Location: [place] [facility];  Service: Cardiovascular;  Laterality: Left;  . ABDOMINAL HYSTERECTOMY  1985  . AV FISTULA PLACEMENT    . CATARACT EXTRACTION  2010  . CORONARY ARTERY BYPASS GRAFT  1996  . DIALYSIS FISTULA CREATION    . [place]   right  . INTRAMEDULLARY (IM) NAIL INTERTROCHANTERIC Right 08/17/2018   Procedure: INTRAMEDULLARY (IM) NAIL INTERTROCHANTRIC;  Surgeon: Earnestine Leys, MD;  Location: [facility];  Service: Orthopedics;  Laterality: Right;  . PERIPHERAL VASCULAR BALLOON ANGIOPLASTY  02/21/2019   Procedure: PERIPHERAL VASCULAR BALLOON ANGIOPLASTY;  Surgeon: Algernon Huxley, MD;  Location: [place] [facility];  Service: Cardiovascular;;  . PERIPHERAL VASCULAR CATHETERIZATION Left 01/06/2015   Procedure: A/V Shuntogram/Fistulagram;  Surgeon: Katha Cabal, MD;  Location: [place] [facility];  Service: Cardiovascular;  Laterality: Left;  . PERIPHERAL VASCULAR CATHETERIZATION N/A 02/17/2015   Procedure: Dialysis/Perma Catheter Removal;  Surgeon: Katha Cabal, MD;  Location: [place] [facility];  Service: Cardiovascular;  Laterality: N/A;   . PERIPHERAL VASCULAR CATHETERIZATION N/A 01/28/2016   Procedure: A/V Shuntogram/Fistulagram;  Surgeon: Algernon Huxley, MD;  Location: [place] [facility];  Service: Cardiovascular;  Laterality: N/A;  . PERIPHERAL VASCULAR CATHETERIZATION N/A 01/28/2016   Procedure: A/V Shunt Intervention;  Surgeon: Algernon Huxley, MD;  Location: [place] [facility];  Service: Cardiovascular;  Laterality: N/A;  . TUBAL LIGATION  1973    Allergies Ace inhibitors, Amlodipine, Clonidine hydrochloride, Codeine, Latex, Metformin, and Tape  Social History Social History   Tobacco Use  . Smoking status: Never Smoker  . Smokeless tobacco: Never Used  Substance Use Topics  . Alcohol use: No  . Drug use: No    Review of Systems Constitutional: Negative for fever. Cardiovascular: Negative for chest pain. Respiratory: Negative for shortness of breath. Gastrointestinal: Positive for abdominal pain, constipation Musculoskeletal: Negative for back pain. Skin: Negative for rash. Neurological: Negative for headaches, focal weakness or numbness.  All systems negative/normal/unremarkable except as stated in the HPI  ____________________________________________   PHYSICAL EXAM:  VITAL SIGNS: ED Triage Vitals  Enc Vitals Group     BP 10/09/19 1804 (!) 105/50     Pulse Rate 10/09/19 1804 84     Resp 10/09/19 1804 18     Temp 10/09/19  1804 98.4 F (36.9 C)     Temp Source 10/09/19 1804 Oral     SpO2 10/09/19 1804 95 %     Weight 10/09/19 1805 250 lb (113.4 kg)     Height 10/09/19 1805 5\' 3"  (1.6 m)     Head Circumference --      Peak Flow --      Pain Score 10/09/19 1805 0     Pain Loc --      Pain Edu? --      Excl. in [place]? --    Constitutional: Alert and oriented.  Chronically ill-appearing, no distress Eyes: Conjunctivae are normal. Normal extraocular movements. ENT      Head: Normocephalic and atraumatic.      Nose: No congestion/rhinnorhea.      Mouth/Throat: Mucous membranes are moist.       Neck: No stridor. Cardiovascular: Normal rate, regular rhythm. No murmurs, rubs, or gallops. Respiratory: Normal respiratory effort without tachypnea nor retractions. Breath sounds are clear and equal bilaterally. No wheezes/rales/rhonchi. Gastrointestinal: Suprapubic and lower abdominal tenderness with possible mass, no rebound or guarding, hypoactive bowel sounds Musculoskeletal: Nontender with normal range of motion in extremities. No lower extremity tenderness nor edema. Neurologic:  Normal speech and language. No gross focal neurologic deficits are appreciated.  Weakness is noted, poor ambulation Skin:  Skin is warm, dry and intact. No rash noted. Psychiatric: Flat affect ____________________________________________  ED COURSE:  As part of my medical decision making, I reviewed the following data within the [facility] History obtained from family if available, nursing notes, old chart and ekg, as well as notes from prior ED visits. Patient presented for abdominal pain and possible constipation, we will assess with labs and imaging as indicated at this time.   Procedures  Dawn Bradford was evaluated in Emergency Department on 10/09/2019 for the symptoms described in the history of present illness. She was evaluated in the context of the global COVID-19 pandemic, which necessitated consideration that the patient might be at risk for infection with the SARS-CoV-2 virus that causes COVID-19. Institutional protocols and algorithms that pertain to the evaluation of patients at risk for COVID-19 are in a state of rapid change based on information released by regulatory bodies including the CDC and federal and state organizations. These policies and algorithms were followed during the patient's care in the ED.  ____________________________________________   LABS (pertinent positives/negatives)  Labs Reviewed  COMPREHENSIVE METABOLIC PANEL - Abnormal; Notable for the following  components:      Result Value   Glucose, Bld 135 (*)    Creatinine, Ser 2.57 (*)    Calcium 8.5 (*)    Albumin 3.1 (*)    GFR calc non Af Amer 17 (*)    GFR calc Af Amer 20 (*)    All other components within normal limits  CBC - Abnormal; Notable for the following components:   RBC 3.10 (*)    Hemoglobin 10.7 (*)    HCT 33.3 (*)    MCV 107.4 (*)    MCH 34.5 (*)    Platelets 131 (*)    All other components within normal limits  URINALYSIS, COMPLETE (UACMP) WITH MICROSCOPIC - Abnormal; Notable for the following components:   Color, Urine AMBER (*)    APPearance TURBID (*)    Protein, ur 100 (*)    Leukocytes,Ua LARGE (*)    WBC, UA >50 (*)    Bacteria, UA FEW (*)    All other  components within normal limits  LIPASE, BLOOD    RADIOLOGY Images were viewed by me IMPRESSION: Large stool burden. CT of the abdomen pelvis with contrast IMPRESSION:  1. Moderate left hydronephrosis and prominent left hydroureter  extending down to the level of the distal ureter, cause uncertain.  No obvious stone identified. The possibility of a distal ureteral  stricture is raised.  2. Atrophic and probably nonfunctional right kidney.  3. Large amount of stool in the rectal vault favoring impaction.  There is potentially some mild wall thickening in the rectum and  early stercoral colitis is not excluded.  4. Prominent stool throughout the colon favors constipation.  5. Descending colon and sigmoid colon diverticulosis.  6. Lumbar impingement at L4-5 and L5-S1.  7. 2.1 by 1.7 by 1.7 cm fluid density lesion on the rightward margin  of the pancreatic head, probably a pancreatic cystic lesion or type  1 choledochal cyst (the former is favored). Intraductal papillary  mucinous neoplasm is not excluded. In light of the patient's age and  the lesion size, follow up pancreatic MRI is recommended in 2 years  time. This recommendation follows ACR consensus guidelines:  Management of Incidental  Pancreatic Cysts: A White Paper of the ACR  Incidental Findings Committee. [place] [id number].   Aortic Atherosclerosis (ICD10-I70.0).  ____________________________________________   DIFFERENTIAL DIAGNOSIS   Constipation, diverticulitis, perforation, small bowel obstruction, chronic pain, end-stage renal disease, electrolyte abnormality  FINAL ASSESSMENT AND PLAN  Abdominal pain, acute urinary retention with urinary infection, fecal impaction   Plan: The patient had presented for diffuse lower abdominal pain. Patient's labs revealed stable electrolytes and chronic kidney disease. Patient's imaging initially revealed large stool burden on x-ray, CT of the abdomen pelvis showed urinary retention as well as fecal impaction.  We placed a Foley catheter with approximately 1 L urine output.  We will leave this in place.  I also disimpacted her and she had large amount of stool in the rectal vault.  We then employed a mineral oil enema.  Patient was started on ciprofloxacin.  She is cleared for outpatient follow-up.   Laurence Aly, MD    Note: This note was generated in part or whole with voice recognition software. Voice recognition is usually quite accurate but there are transcription errors that can and very often do occur. I apologize for any typographical errors that were not detected and corrected.     Earleen [place], MD 10/09/19 [phone]

## 2019-10-10 ENCOUNTER — Telehealth: Payer: Self-pay | Admitting: Obstetrics & Gynecology

## 2019-10-10 NOTE — Telephone Encounter (Signed)
Called and left voice mail for patient to call back to be schedule °

## 2019-10-10 NOTE — Telephone Encounter (Signed)
[facility] primary referring for discharge. Called and left voicemail for patient's daughter [name] to call back to  Schedule patient's appointment with Dr. Kenton Kingfisher.

## 2019-10-17 ENCOUNTER — Other Ambulatory Visit: Payer: Self-pay

## 2019-10-17 ENCOUNTER — Encounter (INDEPENDENT_AMBULATORY_CARE_PROVIDER_SITE_OTHER): Payer: Self-pay | Admitting: Nurse Practitioner

## 2019-10-17 ENCOUNTER — Encounter (INDEPENDENT_AMBULATORY_CARE_PROVIDER_SITE_OTHER): Payer: Self-pay

## 2019-10-17 ENCOUNTER — Ambulatory Visit (INDEPENDENT_AMBULATORY_CARE_PROVIDER_SITE_OTHER): Payer: Medicare Other

## 2019-10-17 ENCOUNTER — Telehealth (INDEPENDENT_AMBULATORY_CARE_PROVIDER_SITE_OTHER): Payer: Self-pay

## 2019-10-17 ENCOUNTER — Ambulatory Visit (INDEPENDENT_AMBULATORY_CARE_PROVIDER_SITE_OTHER): Payer: Medicare Other | Admitting: Nurse Practitioner

## 2019-10-17 VITALS — BP 129/73 | HR 74 | Resp 12

## 2019-10-17 DIAGNOSIS — J449 Chronic obstructive pulmonary disease, unspecified: Secondary | ICD-10-CM | POA: Diagnosis not present

## 2019-10-17 DIAGNOSIS — Z992 Dependence on renal dialysis: Secondary | ICD-10-CM

## 2019-10-17 DIAGNOSIS — N186 End stage renal disease: Secondary | ICD-10-CM

## 2019-10-17 DIAGNOSIS — I10 Essential (primary) hypertension: Secondary | ICD-10-CM

## 2019-10-17 NOTE — Progress Notes (Signed)
SUBJECTIVE:  Patient ID: Dawn Bradford, female    DOB: December 14, 1935, 84 y.o.   MRN: [id number] Chief Complaint  Patient presents with  . Follow-up    U/S Follow up    HPI  Dawn Bradford is a 84 y.o. female The patient returns to the office for follow up regarding problem with the dialysis access. Currently the patient is maintained via a left radial cephalic AVF    The patient notes a significant increase in bleeding time after decannulation.   The patient denies hand pain or other symptoms consistent with steal phenomena.  No significant arm swelling.  The patient denies redness or swelling at the access site. The patient denies fever or chills at home or while on dialysis.  The patient denies amaurosis fugax or recent TIA symptoms. There are no recent neurological changes noted. The patient denies claudication symptoms or rest pain symptoms. The patient denies history of DVT, PE or superficial thrombophlebitis. The patient denies recent episodes of angina or shortness of breath.   Patient home aide is present with her today.  She also helps to provide some history.   Today the patient has a flow volume of 452 down from 830 on 04/02/2019.  The AV fistula appears to be patent.  There is an aneurysmal segment seen in the mid segment the measurements began at original segment 0.5 cm to 1.39 cm and 1.95 cm at largest diameter.  Past Medical History:  Diagnosis Date  . ACE-inhibitor cough   . Aortic valve disorder    Echo in 2009 showed mean aortic valve gradient of 13 mmHg, suggesting very mild stenosis. Echo (12/10) suggested aortic sclerosis only  . Carotid artery disease (HCC)    mild, carotid dopplers 12/2009  . CHF (congestive heart failure) ([place])   . COPD (chronic obstructive pulmonary disease) ([place])   . Coronary artery disease    s/p anterior MI in 1996 followed by CABG. Lexiscan myoview (4/11): EF 67%, normal perfusion with no evidence for ischemia or infarction.   . Diabetes  mellitus    type 2  . Diastolic heart failure    most recent echo (12/10) showed EF 55-60% with mild LVH, grade I diastolic dysfunction, mild LAE.  [name] ESRD (end stage renal disease) on dialysis ([place])   . Hyperlipidemia   . Hypertension    resistant hyptertension times many years. the patient does have renal artery stenosis. she has tried calcium channel blockers in the past and states that she would not take them now because she had some problems with her gums which her dentist identified as calcium-channel blocker side effects.  . Hypothyroidism   . Mild asthma    PFT 02/05/10 FEV1 1.38, FEV1% 73, TLC 3.78 (86%), DLCO 48%, +BD  . Myocardial infarction ([place])   . Obesity   . Obesity hypoventilation syndrome ([place])       . Obstructive sleep apnea    PSG 01/05/2006 AHI 24.8, CPAP 9cm H2O  . Pulmonary nodule, right   . Renal artery stenosis [facility])    The patient has an occluded right renal artery and an atrophic right kidney. There is 20% left renal artery stenosis. This was seen by catheterization in 2007  . Stroke [facility])     Past Surgical History:  Procedure Laterality Date  . A/V FISTULAGRAM N/A 01/25/2018   Procedure: A/V FISTULAGRAM;  Surgeon: Algernon Huxley, MD;  Location: [place] [facility];  Service: Cardiovascular;  Laterality: N/A;  . A/V FISTULAGRAM  Left 02/21/2019   Procedure: A/V FISTULAGRAM;  Surgeon: Algernon Huxley, MD;  Location: [place] [facility];  Service: Cardiovascular;  Laterality: Left;  . ABDOMINAL HYSTERECTOMY  1985  . AV FISTULA PLACEMENT    . CATARACT EXTRACTION  2010  . CORONARY ARTERY BYPASS GRAFT  1996  . DIALYSIS FISTULA CREATION    . [place]   right  . INTRAMEDULLARY (IM) NAIL INTERTROCHANTERIC Right 08/17/2018   Procedure: INTRAMEDULLARY (IM) NAIL INTERTROCHANTRIC;  Surgeon: Earnestine Leys, MD;  Location: [facility] ORS;  Service: Orthopedics;  Laterality: Right;  . PERIPHERAL VASCULAR BALLOON ANGIOPLASTY  02/21/2019   Procedure: PERIPHERAL VASCULAR  BALLOON ANGIOPLASTY;  Surgeon: Algernon Huxley, MD;  Location: [place] [facility];  Service: Cardiovascular;;  . PERIPHERAL VASCULAR CATHETERIZATION Left 01/06/2015   Procedure: A/V Shuntogram/Fistulagram;  Surgeon: Katha Cabal, MD;  Location: [place] [facility];  Service: Cardiovascular;  Laterality: Left;  . PERIPHERAL VASCULAR CATHETERIZATION N/A 02/17/2015   Procedure: Dialysis/Perma Catheter Removal;  Surgeon: Katha Cabal, MD;  Location: [place] [facility];  Service: Cardiovascular;  Laterality: N/A;  . PERIPHERAL VASCULAR CATHETERIZATION N/A 01/28/2016   Procedure: A/V Shuntogram/Fistulagram;  Surgeon: Algernon Huxley, MD;  Location: [place] [facility];  Service: Cardiovascular;  Laterality: N/A;  . PERIPHERAL VASCULAR CATHETERIZATION N/A 01/28/2016   Procedure: A/V Shunt Intervention;  Surgeon: Algernon Huxley, MD;  Location: [place] [facility];  Service: Cardiovascular;  Laterality: N/A;  . TUBAL LIGATION  1973    Social History   Socioeconomic History  . Marital status: Married    Spouse name: Not on file  . Number of children: Not on file  . Years of education: Not on file  . Highest education level: Not on file  Occupational History  . Not on file  Tobacco Use  . Smoking status: Never Smoker  . Smokeless tobacco: Never Used  Substance and Sexual Activity  . Alcohol use: No  . Drug use: No  . Sexual activity: Not on file  Other Topics Concern  . Not on file  Social History Narrative  . Not on file   Social Determinants of Health   Financial Resource Strain:   . Difficulty of Paying Living Expenses: Not on file  Food Insecurity:   . Worried About Charity fundraiser in the Last Year: Not on file  . Ran Out of Food in the Last Year: Not on file  Transportation Needs:   . Lack of Transportation (Medical): Not on file  . Lack of Transportation (Non-Medical): Not on file  Physical Activity:   . Days of Exercise per Week: Not on file  . Minutes of Exercise per  Session: Not on file  Stress:   . Feeling of Stress : Not on file  Social Connections:   . Frequency of Communication with Friends and Family: Not on file  . Frequency of Social Gatherings with Friends and Family: Not on file  . Attends Religious Services: Not on file  . Active Member of Clubs or Organizations: Not on file  . Attends Archivist Meetings: Not on file  . Marital Status: Not on file  Intimate Partner Violence:   . Fear of Current or Ex-Partner: Not on file  . Emotionally Abused: Not on file  . Physically Abused: Not on file  . Sexually Abused: Not on file    Family History  Problem Relation Age of Onset  . Kidney failure Mother   .  Diabetes Mother   . Aortic aneurysm Father   . Coronary artery disease Father   . Heart attack Father   . Diabetes Sister   . Diabetes Brother   . Kidney failure Brother     Allergies  Allergen Reactions  . Ace Inhibitors Other (See Comments)    Reaction:  Unknown   . Amlodipine Other (See Comments)    Reaction:  Unknown   . Clonidine Hydrochloride Other (See Comments)    Reaction:  Unknown   . Codeine Hives  . Latex Hives  . Metformin Nausea And Vomiting  . Tape Rash     Review of Systems   Review of Systems: Negative Unless Checked Constitutional: [] Weight loss  [] Fever  [] Chills Cardiac: [] Chest pain   []  Atrial Fibrillation  [] Palpitations   [] Shortness of breath when laying flat   [] Shortness of breath with exertion. [] Shortness of breath at rest Vascular:  [] Pain in legs with walking   [] Pain in legs with standing [] Pain in legs when laying flat   [] Claudication    [] Pain in feet when laying flat    [] History of DVT   [] Phlebitis   [] Swelling in legs   [] Varicose veins   [] Non-healing ulcers Pulmonary:   [] Uses home oxygen   [] Productive cough   [] Hemoptysis   [] Wheeze  [x] COPD   [x] Asthma Neurologic:  [] Dizziness   [] Seizures  [] Blackouts [x] History of stroke   [] History of TIA  [] Aphasia   [] Temporary  Blindness   [] Weakness or numbness in arm   [x] Weakness or numbness in leg Musculoskeletal:   [] Joint swelling   [] Joint pain   [] Low back pain  []  History of Knee Replacement [] Arthritis [] back Surgeries  []  Spinal Stenosis    Hematologic:  [] Easy bruising  [] Easy bleeding   [] Hypercoagulable state   [x] Anemic Gastrointestinal:  [] Diarrhea   [] Vomiting  [] Gastroesophageal reflux/heartburn   [] Difficulty swallowing. [] Abdominal pain Genitourinary:  [x] Chronic kidney disease   [] Difficult urination  [] Anuric   [] Blood in urine [] Frequent urination  [] Burning with urination   [] Hematuria Skin:  [] Rashes   [] Ulcers [] Wounds Psychological:  [] History of anxiety   []  History of major depression  [x]  Memory Difficulties      OBJECTIVE:   Physical Exam  BP 129/73 (BP Location: Right Arm)   Pulse 74   Resp 12   LMP 02/01/1986 (Approximate)   Gen: WD/WN, NAD Head: [place]/AT, No temporalis wasting.  Ear/Nose/Throat: Hearing grossly intact, nares w/o erythema or drainage Eyes: PER, EOMI, sclera nonicteric.  Neck: Supple, no masses.  No JVD.  Pulmonary:  Good air movement, no use of accessory muscles.  Cardiac: RRR Vascular:  Very faint thrill only present over aneurysmal area.  Good bruit in her distal portion, nearly absent at midportion, dampened at the proximal portion Vessel Right Left  Radial Palpable Palpable   Gastrointestinal: soft, non-distended. No guarding/no peritoneal signs.  Musculoskeletal: In a wheelchair. No deformity or atrophy.  Neurologic: Pain and light touch intact in extremities.  Symmetrical.  Speech is fluent. Motor exam as listed above. Psychiatric: Judgment intact, Mood & affect appropriate for pt's clinical situation. Dermatologic: No Venous rashes. No Ulcers Noted.  No changes consistent with cellulitis. Lymph : No Cervical lymphadenopathy, no lichenification or skin changes of chronic lymphedema.       ASSESSMENT AND PLAN:  1. ESRD on dialysis  [facility]) Recommend:  The patient is experiencing increasing problems with their dialysis access.  Patient should have a fistulagram with the intention for intervention.  The intention for intervention is to restore appropriate flow and prevent thrombosis and possible loss of the access.  As well as improve the quality of dialysis therapy.  The risks, benefits and alternative therapies were reviewed in detail with the patient.  All questions were answered.  The patient agrees to proceed with angio/intervention.      2. Essential hypertension Continue antihypertensive medications as already ordered, these medications have been reviewed and there are no changes at this time.   3. COPD with asthma ([place]) Continue pulmonary medications and aerosols as already ordered, these medications have been reviewed and there are no changes at this time.     Current Outpatient Medications on File Prior to Visit  Medication Sig Dispense Refill  . carvedilol (COREG) 25 MG tablet Take 25 mg by mouth 2 (two) times daily.     [name] FLUoxetine (PROZAC) 40 MG capsule Take 50 mg by mouth daily.     . furosemide (LASIX) 20 MG tablet Take 20 mg by mouth once a week. Give on Thursday    . isosorbide mononitrate (IMDUR) 30 MG 24 hr tablet Take 30 mg by mouth at bedtime.   0  . levothyroxine (SYNTHROID, LEVOTHROID) 75 MCG tablet Take 75 mcg by mouth daily.     . methocarbamol (ROBAXIN) 500 MG tablet Take 500 mg by mouth every 8 (eight) hours as needed for muscle spasms.    . midodrine (PROAMATINE) 5 MG tablet Take 1 tablet (5 mg total) by mouth 2 (two) times daily with a meal. 30 tablet 0  . omeprazole (PRILOSEC) 10 MG capsule Take 10 mg by mouth daily.    . ondansetron (ZOFRAN) 4 MG tablet Take 4 mg by mouth 2 (two) times daily as needed for nausea or vomiting.    . OXYGEN Inhale 2 L/min into the lungs continuous.    . rosuvastatin (CRESTOR) 40 MG tablet Take 40 mg by mouth every evening.     [name] telmisartan (MICARDIS)  80 MG tablet Take 80 mg by mouth daily. At night    . traMADol (ULTRAM) 50 MG tablet Take 50 mg by mouth every 6 (six) hours as needed.    . traZODone (DESYREL) 100 MG tablet Take 100 mg by mouth at bedtime.     [name] acetaminophen (TYLENOL) 325 MG tablet Take 2 tablets (650 mg total) by mouth every 6 (six) hours as needed for mild pain, fever or headache (or Fever >/= 101). 30 tablet 0  . albuterol (PROVENTIL HFA;VENTOLIN HFA) 108 (90 Base) MCG/ACT inhaler Inhale 2 puffs into the lungs every 6 (six) hours as needed for wheezing or shortness of breath.    . Amino Acids-Protein Hydrolys (FEEDING SUPPLEMENT, PRO-STAT SUGAR FREE 64,) LIQD Take 30 mLs by mouth 2 (two) times daily.    [name] b complex-vitamin c-folic acid (NEPHRO-VITE) 0.8 MG TABS tablet Take 1 tablet by mouth daily.    . Cholecalciferol (VITAMIN D3) 5000 UNITS TABS Take 5,000 Units by mouth daily.    . ferrous fumarate-iron polysaccharide complex (TANDEM) 162-115.2 MG CAPS capsule Take 1 capsule by mouth daily.    . ferrous sulfate 325 (65 FE) MG tablet Take 325 mg by mouth daily with breakfast.    . Fluticasone-Salmeterol (ADVAIR) 100-50 MCG/DOSE AEPB Inhale 1 puff into the lungs 2 (two) times daily.    . furosemide (LASIX) 80 MG tablet Give 1 tablet by mouth daily on Sunday, Tuesday and Saturday    . hydrALAZINE (APRESOLINE) 50 MG tablet Take 50 mg  by mouth 2 (two) times daily.   1  . Infant Care Products [facility] EX) Apply liberal amount topically to area of skin irritation as needed.  OK to leave at bedside    . ipratropium (ATROVENT) 0.02 % nebulizer solution Take 0.5 mg by nebulization every 6 (six) hours as needed for wheezing or shortness of breath.    . lidocaine-prilocaine (EMLA) cream Apply 1 application topically as needed (for pain).    . Melatonin 5 MG TABS Take 5 mg by mouth at bedtime.    . NON FORMULARY Diet Type:  Renal, NAS, NCS, 1200 cc fluid restriction    . nystatin (MYCOSTATIN/NYSTOP) 100000 UNIT/GM POWD Apply 1 g  topically as needed (for irritation).     . polyethylene glycol (MIRALAX / GLYCOLAX) 17 g packet Take 17 g by mouth daily. (Patient not taking: Reported on 10/17/2019) 14 each 0  . Sennosides-Docusate Sodium (SENNA PLUS) 8.6-50 MG CAPS Take 2 capsules by mouth 2 (two) times daily as needed.     . triamcinolone (KENALOG) 0.5 % cream Apply 1 application topically as needed (for irritation).     [name] UNABLE TO FIND CPAP @@ Bedtime     No current facility-administered medications on file prior to visit.    There are no Patient Instructions on file for this visit. No follow-ups on file.   Kris Hartmann, NP  This note was completed with Sales executive.  Any errors are purely unintentional.

## 2019-10-17 NOTE — Telephone Encounter (Signed)
Spoke with the patient and she is now scheduled with Dr. Lucky Cowboy for a LUE fistulagram on 10/24/19 with a 9:45 am arrival time to the [facility]. Patient will do covid testing on 10/22/19 between 12:30-2:30 pm at the [facility]. Pre-procedure instructions were discussed and handed to the patient.

## 2019-10-21 ENCOUNTER — Ambulatory Visit (INDEPENDENT_AMBULATORY_CARE_PROVIDER_SITE_OTHER): Payer: Medicare Other | Admitting: Urology

## 2019-10-21 ENCOUNTER — Other Ambulatory Visit: Payer: Self-pay

## 2019-10-21 ENCOUNTER — Encounter: Payer: Self-pay | Admitting: Urology

## 2019-10-21 VITALS — BP 120/68 | HR 66 | Ht 62.0 in | Wt 250.0 lb

## 2019-10-21 DIAGNOSIS — R339 Retention of urine, unspecified: Secondary | ICD-10-CM | POA: Diagnosis not present

## 2019-10-21 NOTE — Progress Notes (Signed)
10/21/2019 8:20 AM   Dawn Bradford 1936-08-02 [id number]  Referring provider: Hortencia Pilar, [address],  [place] [address]  Chief Complaint  Patient presents with  . Follow-up    HPI: Elderly patient recently went to the emergency room.  She had severe lower abdominal pain.  She is a dialysis patient.  She had a CT scan with moderate left hydronephrosis and prominent left ureter to the distal ureter with no stone.  It appeared she had a nonfunctioning right kidney.  She had evidence of impaction.  It was felt that she was in retention on CT scan.  A Foley catheter was placed for 1 L.  She was disimpacted and enema given.  Caregiver said urine was very foul-smelling and dark-colored.  Also treated for vaginal discharge.  By history patient took Keflex  Patient voids into a pad system.  Limited history by caregiver and daughter.  It is difficult to say how frequently she goes.  They do not report a lot of bladder infections   PMH: Past Medical History:  Diagnosis Date  . ACE-inhibitor cough   . Aortic valve disorder    Echo in 2009 showed mean aortic valve gradient of 13 mmHg, suggesting very mild stenosis. Echo (12/10) suggested aortic sclerosis only  . Carotid artery disease (HCC)    mild, carotid dopplers 12/2009  . CHF (congestive heart failure) ([place])   . COPD (chronic obstructive pulmonary disease) ([place])   . Coronary artery disease    s/p anterior MI in 1996 followed by CABG. Lexiscan myoview (4/11): EF 67%, normal perfusion with no evidence for ischemia or infarction.   . Diabetes mellitus    type 2  . Diastolic heart failure    most recent echo (12/10) showed EF 55-60% with mild LVH, grade I diastolic dysfunction, mild LAE.  [name] ESRD (end stage renal disease) on dialysis ([place])   . Hyperlipidemia   . Hypertension    resistant hyptertension times many years. the patient does have renal artery stenosis. she has tried calcium channel blockers in the past and  states that she would not take them now because she had some problems with her gums which her dentist identified as calcium-channel blocker side effects.  . Hypothyroidism   . Mild asthma    PFT 02/05/10 FEV1 1.38, FEV1% 73, TLC 3.78 (86%), DLCO 48%, +BD  . Myocardial infarction ([place])   . Obesity   . Obesity hypoventilation syndrome ([place])       . Obstructive sleep apnea    PSG 01/05/2006 AHI 24.8, CPAP 9cm H2O  . Pulmonary nodule, right   . Renal artery stenosis [facility])    The patient has an occluded right renal artery and an atrophic right kidney. There is 20% left renal artery stenosis. This was seen by catheterization in 2007  . Stroke [facility])     Surgical History: Past Surgical History:  Procedure Laterality Date  . A/V FISTULAGRAM N/A 01/25/2018   Procedure: A/V FISTULAGRAM;  Surgeon: Algernon Huxley, MD;  Location: [place] [facility];  Service: Cardiovascular;  Laterality: N/A;  . A/V FISTULAGRAM Left 02/21/2019   Procedure: A/V FISTULAGRAM;  Surgeon: Algernon Huxley, MD;  Location: [facility];  Service: Cardiovascular;  Laterality: Left;  . ABDOMINAL HYSTERECTOMY  1985  . AV FISTULA PLACEMENT    . CATARACT EXTRACTION  2010  . CORONARY ARTERY BYPASS GRAFT  1996  . DIALYSIS FISTULA CREATION    . INNER EAR SURGERY  1979   right  . INTRAMEDULLARY (IM) NAIL INTERTROCHANTERIC Right 08/17/2018   Procedure: INTRAMEDULLARY (IM) NAIL INTERTROCHANTRIC;  Surgeon: Earnestine Leys, MD;  Location: [facility];  Service: Orthopedics;  Laterality: Right;  . PERIPHERAL VASCULAR BALLOON ANGIOPLASTY  02/21/2019   Procedure: PERIPHERAL VASCULAR BALLOON ANGIOPLASTY;  Surgeon: Algernon Huxley, MD;  Location: [place] [facility];  Service: Cardiovascular;;  . PERIPHERAL VASCULAR CATHETERIZATION Left 01/06/2015   Procedure: A/V Shuntogram/Fistulagram;  Surgeon: Katha Cabal, MD;  Location: [place] [facility];  Service: Cardiovascular;  Laterality: Left;  . PERIPHERAL VASCULAR CATHETERIZATION N/A  02/17/2015   Procedure: Dialysis/Perma Catheter Removal;  Surgeon: Katha Cabal, MD;  Location: [facility] [facility];  Service: Cardiovascular;  Laterality: N/A;  . PERIPHERAL VASCULAR CATHETERIZATION N/A 01/28/2016   Procedure: A/V Shuntogram/Fistulagram;  Surgeon: Algernon Huxley, MD;  Location: [place];  Service: Cardiovascular;  Laterality: N/A;  . PERIPHERAL VASCULAR CATHETERIZATION N/A 01/28/2016   Procedure: A/V Shunt Intervention;  Surgeon: Algernon Huxley, MD;  Location: [place] [facility];  Service: Cardiovascular;  Laterality: N/A;  . TUBAL LIGATION  1973    Home Medications:  Allergies as of 10/21/2019      Reactions   Ace Inhibitors Other (See Comments)   Reaction:  Unknown    Amlodipine Other (See Comments)   Reaction:  Unknown    Clonidine Hydrochloride Other (See Comments)   Reaction:  Unknown    Codeine Hives   Latex Hives   Metformin Nausea And Vomiting   Tape Rash      Medication List       Accurate as of October 21, 2019  8:20 AM. If you have any questions, ask your nurse or doctor.        acetaminophen 325 MG tablet Commonly known as: TYLENOL Take 2 tablets (650 mg total) by mouth every 6 (six) hours as needed for mild pain, fever or headache (or Fever >/= 101).   albuterol 108 (90 Base) MCG/ACT inhaler Commonly known as: VENTOLIN HFA Inhale 2 puffs into the lungs every 6 (six) hours as needed for wheezing or shortness of breath.   b complex-vitamin c-folic acid 0.8 MG Tabs tablet Take 1 tablet by mouth daily.   carvedilol 25 MG tablet Commonly known as: COREG Take 25 mg by mouth 2 (two) times daily.   DERMACLOUD EX Apply liberal amount topically to area of skin irritation as needed.  OK to leave at bedside   feeding supplement (PRO-STAT SUGAR FREE 64) Liqd Take 30 mLs by mouth 2 (two) times daily.   ferrous fumarate-iron polysaccharide complex 162-115.2 MG Caps capsule Commonly known as: TANDEM Take 1 capsule by mouth daily.     ferrous sulfate 325 (65 FE) MG tablet Take 325 mg by mouth daily with breakfast.   FLUoxetine 40 MG capsule Commonly known as: PROZAC Take 50 mg by mouth daily.   Fluticasone-Salmeterol 100-50 MCG/DOSE Aepb Commonly known as: ADVAIR Inhale 1 puff into the lungs 2 (two) times daily.   furosemide 20 MG tablet Commonly known as: LASIX Take 20 mg by mouth once a week. Give on Thursday   furosemide 80 MG tablet Commonly known as: LASIX Give 1 tablet by mouth daily on Sunday, Tuesday and Saturday   hydrALAZINE 50 MG tablet Commonly known as: APRESOLINE Take 50 mg by mouth 2 (two) times daily.   ipratropium 0.02 % nebulizer solution Commonly known as: ATROVENT Take 0.5 mg by nebulization every 6 (six) hours as needed for  wheezing or shortness of breath.   isosorbide mononitrate 30 MG 24 hr tablet Commonly known as: IMDUR Take 30 mg by mouth at bedtime.   levothyroxine 75 MCG tablet Commonly known as: SYNTHROID Take 75 mcg by mouth daily.   lidocaine-prilocaine cream Commonly known as: EMLA Apply 1 application topically as needed (for pain).   Melatonin 5 MG Tabs Take 5 mg by mouth at bedtime.   methocarbamol 500 MG tablet Commonly known as: ROBAXIN Take 500 mg by mouth every 8 (eight) hours as needed for muscle spasms.   midodrine 5 MG tablet Commonly known as: PROAMATINE Take 1 tablet (5 mg total) by mouth 2 (two) times daily with a meal.   NON FORMULARY Diet Type:  Renal, NAS, NCS, 1200 cc fluid restriction   nystatin powder Generic drug: nystatin Apply 1 g topically as needed (for irritation).   omeprazole 10 MG capsule Commonly known as: PRILOSEC Take 10 mg by mouth daily.   ondansetron 4 MG tablet Commonly known as: ZOFRAN Take 4 mg by mouth 2 (two) times daily as needed for nausea or vomiting.   OXYGEN Inhale 2 L/min into the lungs continuous.   polyethylene glycol 17 g packet Commonly known as: MIRALAX / GLYCOLAX Take 17 g by mouth daily.    rosuvastatin 40 MG tablet Commonly known as: CRESTOR Take 40 mg by mouth every evening.   Senna Plus 8.6-50 MG Caps Generic drug: Sennosides-Docusate Sodium Take 2 capsules by mouth 2 (two) times daily as needed.   telmisartan 80 MG tablet Commonly known as: MICARDIS Take 80 mg by mouth daily. At night   traMADol 50 MG tablet Commonly known as: ULTRAM Take 50 mg by mouth every 6 (six) hours as needed.   traZODone 100 MG tablet Commonly known as: DESYREL Take 100 mg by mouth at bedtime.   triamcinolone cream 0.5 % Commonly known as: KENALOG Apply 1 application topically as needed (for irritation).   UNABLE TO FIND CPAP @@ Bedtime   Vitamin D3 125 MCG (5000 UT) Tabs Take 5,000 Units by mouth daily.       Allergies:  Allergies  Allergen Reactions  . Ace Inhibitors Other (See Comments)    Reaction:  Unknown   . Amlodipine Other (See Comments)    Reaction:  Unknown   . Clonidine Hydrochloride Other (See Comments)    Reaction:  Unknown   . Codeine Hives  . Latex Hives  . Metformin Nausea And Vomiting  . Tape Rash    Family History: Family History  Problem Relation Age of Onset  . Kidney failure Mother   . Diabetes Mother   . Aortic aneurysm Father   . Coronary artery disease Father   . Heart attack Father   . Diabetes Sister   . Diabetes Brother   . Kidney failure Brother     Social History:  reports that she has never smoked. She has never used smokeless tobacco. She reports that she does not drink alcohol or use drugs.  ROS:                                        Physical Exam: BP 120/68   Pulse 66   Ht 5\' 2"  (1.575 m)   Wt 250 lb (113.4 kg)   LMP 02/01/1986 (Approximate)   BMI 45.73 kg/m   Constitutional:  Alert and oriented, No acute distress. HEENT: [place] AT,  moist mucus membranes.  Trachea midline, no masses. Cardiovascular: No clubbing, cyanosis, or edema. Respiratory: Normal respiratory effort, no increased work of  breathing. GI: Abdomen is soft, nontender, nondistended, no abdominal masses GU: No CVA tenderness.  Foley catheter Skin: No rashes, bruises or suspicious lesions. Lymph: No cervical or inguinal adenopathy. Neurologic: Grossly intact, no focal deficits, moving all 4 extremities. Psychiatric: Normal mood and affect.  Laboratory Data: Lab Results  Component Value Date   WBC 8.5 10/09/2019   HGB 10.7 (L) 10/09/2019   HCT 33.3 (L) 10/09/2019   MCV 107.4 (H) 10/09/2019   PLT 131 (L) 10/09/2019    Lab Results  Component Value Date   CREATININE 2.57 (H) 10/09/2019    No results found for: PSA  No results found for: TESTOSTERONE  Lab Results  Component Value Date   HGBA1C 5.5 10/17/2018    Urinalysis    Component Value Date/Time   COLORURINE AMBER (A) 10/09/2019 2009   APPEARANCEUR TURBID (A) 10/09/2019 2009   LABSPEC 1.011 10/09/2019 2009   PHURINE 6.0 10/09/2019 2009   GLUCOSEU NEGATIVE 10/09/2019 2009   HGBUR NEGATIVE 10/09/2019 2009   [place] NEGATIVE 10/09/2019 2009   [place] NEGATIVE 10/09/2019 2009   PROTEINUR 100 (A) 10/09/2019 2009   NITRITE NEGATIVE 10/09/2019 2009   LEUKOCYTESUR LARGE (A) 10/09/2019 2009    Pertinent Imaging: Reviewed CT  Assessment & Plan: Patient did have left hydroureter tapering down to the iliac vessels on the left side.  Under the circumstances I do not think this needs to be evaluated further.  She is end-stage dialysis.  They would like to do a fill and pull on a day when they can come back in the afternoon not on dialysis day.  This will be arranged.  We can get a urine culture again that day.  Bowel movements are improved.  This might represent a chronic issue.  Did not start patient on Flomax  There are no diagnoses linked to this encounter.  No follow-ups on file.  Reece Packer, MD  [facility] [address] [place], [place] [address] [phone]

## 2019-10-22 ENCOUNTER — Ambulatory Visit: Payer: Medicare Other | Admitting: Physician Assistant

## 2019-10-22 ENCOUNTER — Other Ambulatory Visit: Payer: Self-pay

## 2019-10-22 ENCOUNTER — Ambulatory Visit (INDEPENDENT_AMBULATORY_CARE_PROVIDER_SITE_OTHER): Payer: Medicare Other | Admitting: Physician Assistant

## 2019-10-22 VITALS — BP 129/71 | HR 71

## 2019-10-22 DIAGNOSIS — R339 Retention of urine, unspecified: Secondary | ICD-10-CM

## 2019-10-22 LAB — BLADDER SCAN AMB NON-IMAGING

## 2019-10-22 NOTE — Progress Notes (Signed)
Fill and Pull Catheter Removal  Patient is present today for a catheter removal.  Patient was cleaned and prepped in a sterile fashion 118ml of sterile water/ saline was instilled into the bladder when the patient felt the urge to urinate. 23ml of water was then drained from the balloon.  A [id number] silicone foley cath was removed from the bladder no complications were noted .  Patient as then given some time to void on their own.  Patient cannot void. Patient tolerated well.  Performed by: Alva Garnet & Anneka Studer,CMA  Follow up/ Additional notes: Will follow up this afternoon

## 2019-10-22 NOTE — Patient Instructions (Addendum)
I strongly suspect your recent constipation has played a role in your inability to urinate.  You were able to urinate really well for me in clinic today when you had a bowel movement.  The reason for this is that there are many shared nerves that control the function of both the bowels and the bladder.  Very frequently, patients who are constipated also experienced difficulty urinating and vice versa.  Since you were able to empty her bladder well for me in clinic, I am not replacing your urinary catheter today.  I would like to have you follow-up with me in clinic in several days to scan your bladder again and make sure that you continue to empty her bladder well.  Moving forward, I strongly recommend that you continue to be proactive in managing her bowels and preventing constipation.  I agree with the use of MiraLAX for this purpose.  Please be mindful of the following symptoms of urinary retention for the future: lower abdominal pain, low back pain, abdominal distention, and the inability to urinate or reduced urinary output.  If you develop the symptoms, contact our office during routine office hours, 8 AM to 5 PM Monday through Friday.  If outside those hours, proceed to the emergency department.

## 2019-10-23 ENCOUNTER — Other Ambulatory Visit: Payer: Self-pay

## 2019-10-23 ENCOUNTER — Other Ambulatory Visit
Admission: RE | Admit: 2019-10-23 | Discharge: 2019-10-23 | Disposition: A | Discharge status: Home / Self Care | Payer: Medicare Other | Source: Ambulatory Visit | Attending: Vascular Surgery | Admitting: Vascular Surgery

## 2019-10-23 ENCOUNTER — Other Ambulatory Visit (INDEPENDENT_AMBULATORY_CARE_PROVIDER_SITE_OTHER): Payer: Self-pay | Admitting: Nurse Practitioner

## 2019-10-23 DIAGNOSIS — Z20822 Contact with and (suspected) exposure to covid-19: Secondary | ICD-10-CM | POA: Insufficient documentation

## 2019-10-23 DIAGNOSIS — Z01812 Encounter for preprocedural laboratory examination: Secondary | ICD-10-CM | POA: Diagnosis present

## 2019-10-23 LAB — SARS CORONAVIRUS 2 (TAT 6-24 HRS): SARS Coronavirus 2: NEGATIVE

## 2019-10-24 ENCOUNTER — Other Ambulatory Visit: Payer: Self-pay

## 2019-10-24 ENCOUNTER — Ambulatory Visit
Admission: RE | Admit: 2019-10-24 | Discharge: 2019-10-24 | Disposition: A | Discharge status: Home / Self Care | Payer: Medicare Other | Attending: Vascular Surgery | Admitting: Vascular Surgery

## 2019-10-24 ENCOUNTER — Encounter
Admission: RE | Disposition: A | Discharge status: Home / Self Care | Payer: Self-pay | Source: Home / Self Care | Attending: Vascular Surgery

## 2019-10-24 ENCOUNTER — Encounter: Payer: Self-pay | Admitting: Vascular Surgery

## 2019-10-24 DIAGNOSIS — Z6841 Body Mass Index (BMI) 40.0 and over, adult: Secondary | ICD-10-CM | POA: Diagnosis not present

## 2019-10-24 DIAGNOSIS — Z8673 Personal history of transient ischemic attack (TIA), and cerebral infarction without residual deficits: Secondary | ICD-10-CM | POA: Insufficient documentation

## 2019-10-24 DIAGNOSIS — I252 Old myocardial infarction: Secondary | ICD-10-CM | POA: Diagnosis not present

## 2019-10-24 DIAGNOSIS — T82898A Other specified complication of vascular prosthetic devices, implants and grafts, initial encounter: Secondary | ICD-10-CM | POA: Diagnosis not present

## 2019-10-24 DIAGNOSIS — G4733 Obstructive sleep apnea (adult) (pediatric): Secondary | ICD-10-CM | POA: Insufficient documentation

## 2019-10-24 DIAGNOSIS — Z79890 Hormone replacement therapy (postmenopausal): Secondary | ICD-10-CM | POA: Diagnosis not present

## 2019-10-24 DIAGNOSIS — Z992 Dependence on renal dialysis: Secondary | ICD-10-CM | POA: Diagnosis not present

## 2019-10-24 DIAGNOSIS — E1122 Type 2 diabetes mellitus with diabetic chronic kidney disease: Secondary | ICD-10-CM | POA: Diagnosis not present

## 2019-10-24 DIAGNOSIS — E039 Hypothyroidism, unspecified: Secondary | ICD-10-CM | POA: Diagnosis not present

## 2019-10-24 DIAGNOSIS — E785 Hyperlipidemia, unspecified: Secondary | ICD-10-CM | POA: Insufficient documentation

## 2019-10-24 DIAGNOSIS — J449 Chronic obstructive pulmonary disease, unspecified: Secondary | ICD-10-CM | POA: Diagnosis not present

## 2019-10-24 DIAGNOSIS — I5032 Chronic diastolic (congestive) heart failure: Secondary | ICD-10-CM | POA: Insufficient documentation

## 2019-10-24 DIAGNOSIS — I2510 Atherosclerotic heart disease of native coronary artery without angina pectoris: Secondary | ICD-10-CM | POA: Insufficient documentation

## 2019-10-24 DIAGNOSIS — Y841 Kidney dialysis as the cause of abnormal reaction of the patient, or of later complication, without mention of misadventure at the time of the procedure: Secondary | ICD-10-CM | POA: Diagnosis not present

## 2019-10-24 DIAGNOSIS — I132 Hypertensive heart and chronic kidney disease with heart failure and with stage 5 chronic kidney disease, or end stage renal disease: Secondary | ICD-10-CM | POA: Diagnosis not present

## 2019-10-24 DIAGNOSIS — T82510A Breakdown (mechanical) of surgically created arteriovenous fistula, initial encounter: Secondary | ICD-10-CM | POA: Diagnosis present

## 2019-10-24 DIAGNOSIS — E669 Obesity, unspecified: Secondary | ICD-10-CM | POA: Diagnosis not present

## 2019-10-24 DIAGNOSIS — I6529 Occlusion and stenosis of unspecified carotid artery: Secondary | ICD-10-CM | POA: Diagnosis not present

## 2019-10-24 DIAGNOSIS — Z79899 Other long term (current) drug therapy: Secondary | ICD-10-CM | POA: Insufficient documentation

## 2019-10-24 DIAGNOSIS — N186 End stage renal disease: Secondary | ICD-10-CM | POA: Diagnosis not present

## 2019-10-24 HISTORY — PX: A/V FISTULAGRAM: CATH118298

## 2019-10-24 LAB — GLUCOSE, CAPILLARY
Glucose-Capillary: 106 mg/dL — ABNORMAL HIGH (ref 70–99)
Glucose-Capillary: 98 mg/dL (ref 70–99)

## 2019-10-24 SURGERY — A/V FISTULAGRAM
Anesthesia: Moderate Sedation | Site: Arm Upper | Laterality: Left

## 2019-10-24 MED ORDER — ONDANSETRON HCL 4 MG/2ML IJ SOLN: 4.0000 mg | Freq: Four times a day (QID) | INTRAMUSCULAR | Status: DC | PRN

## 2019-10-24 MED ORDER — MIDAZOLAM HCL 2 MG/2ML IJ SOLN
INTRAMUSCULAR | Status: DC | PRN
  Administered 2019-10-24: 2 mg via INTRAVENOUS

## 2019-10-24 MED ORDER — FENTANYL CITRATE (PF) 100 MCG/2ML IJ SOLN
INTRAMUSCULAR | Status: DC | PRN
  Administered 2019-10-24: 50 ug via INTRAVENOUS

## 2019-10-24 MED ORDER — FENTANYL CITRATE (PF) 100 MCG/2ML IJ SOLN
INTRAMUSCULAR | Status: AC
  Filled 2019-10-24: qty 2

## 2019-10-24 MED ORDER — DIPHENHYDRAMINE HCL 50 MG/ML IJ SOLN: 50.0000 mg | Freq: Once | INTRAMUSCULAR | Status: DC | PRN

## 2019-10-24 MED ORDER — SODIUM CHLORIDE 0.9 % IV SOLN: INTRAVENOUS | Status: DC

## 2019-10-24 MED ORDER — IODIXANOL 320 MG/ML IV SOLN
INTRAVENOUS | Status: DC | PRN
  Administered 2019-10-24: 20 mL via INTRA_ARTERIAL

## 2019-10-24 MED ORDER — CEFAZOLIN SODIUM-DEXTROSE 2-4 GM/100ML-% IV SOLN: 2.0000 g | Freq: Once | INTRAVENOUS | Status: DC

## 2019-10-24 MED ORDER — METHYLPREDNISOLONE SODIUM SUCC 125 MG IJ SOLR: 125.0000 mg | Freq: Once | INTRAMUSCULAR | Status: DC | PRN

## 2019-10-24 MED ORDER — FAMOTIDINE 20 MG PO TABS: 40.0000 mg | ORAL_TABLET | Freq: Once | ORAL | Status: DC | PRN

## 2019-10-24 MED ORDER — CLINDAMYCIN PHOSPHATE 300 MG/50ML IV SOLN: 300.0000 mg | Freq: Once | INTRAVENOUS | Status: DC

## 2019-10-24 MED ORDER — HEPARIN SODIUM (PORCINE) 1000 UNIT/ML IJ SOLN
INTRAMUSCULAR | Status: AC
  Filled 2019-10-24: qty 1

## 2019-10-24 MED ORDER — CLINDAMYCIN PHOSPHATE 300 MG/50ML IV SOLN
INTRAVENOUS | Status: AC
  Administered 2019-10-24: 300 mg
  Filled 2019-10-24: qty 50

## 2019-10-24 MED ORDER — MIDAZOLAM HCL 2 MG/ML PO SYRP: 8.0000 mg | ORAL_SOLUTION | Freq: Once | ORAL | Status: DC | PRN

## 2019-10-24 MED ORDER — MIDAZOLAM HCL 5 MG/5ML IJ SOLN
INTRAMUSCULAR | Status: AC
  Filled 2019-10-24: qty 5

## 2019-10-24 SURGICAL SUPPLY — 7 items
CANNULA 5F STIFF (CANNULA) ×3 IMPLANT
COVER PROBE U/S 5X48 (MISCELLANEOUS) ×3 IMPLANT
PACK ANGIOGRAPHY (CUSTOM PROCEDURE TRAY) ×3 IMPLANT
SHEATH BRITE TIP 6FRX5.5 (SHEATH) ×3 IMPLANT
SUT MNCRL 4-0 (SUTURE) ×2
SUT MNCRL 4-0 27XMFL (SUTURE) ×1
SUTURE MNCRL 4-0 27XMF (SUTURE) ×1 IMPLANT

## 2019-10-24 NOTE — Op Note (Signed)
[facility]    OPERATIVE NOTE   PROCEDURE: 1.   Left radiocephalic arteriovenous fistula cannulation under ultrasound guidance 2.   Left arm fistulagram including central venogram   PRE-OPERATIVE DIAGNOSIS: 1. ESRD 2. Poorly functional aneurysmal left radiocephalic AVF  POST-OPERATIVE DIAGNOSIS: same as above   SURGEON: Leotis Pain, MD  ANESTHESIA: local with MCS  ESTIMATED BLOOD LOSS: 3 cc  FINDING(S): 1. Left radiocephalic anastomosis was widely patent and with compression of the fistula refluxing blood up the radial artery showed this to be widely patent with good filling into the hand as well.  The stent just beyond the anastomosis was widely patent.  The access site was largely aneurysmal but without significant thrombus or stenosis.  There were no other focal stenoses or issues with dual outflow in the upper arm through both the basilic and cephalic veins as well as a patent central venous circulation  SPECIMEN(S):  None  CONTRAST: 20 cc  FLUORO TIME: Less than 1 minutes  MODERATE CONSCIOUS SEDATION TIME: Approximately 15 minutes with 2 mg of Versed and 50 mcg of Fentanyl   INDICATIONS: Dawn Bradford is a 84 y.o. female who presents with malfunctioning left radiocephalic arteriovenous fistula.  The patient is scheduled for left arm fistulagram.  The patient is aware the risks include but are not limited to: bleeding, infection, thrombosis of the cannulated access, and possible anaphylactic reaction to the contrast.  The patient is aware of the risks of the procedure and elects to proceed forward.  DESCRIPTION: After full informed written consent was obtained, the patient was brought back to the angiography suite and placed supine upon the angiography table.  The patient was connected to monitoring equipment. Moderate conscious sedation was administered with a face to face encounter with the patient throughout the procedure with my supervision of the RN  administering medicines and monitoring the patient's vital signs and mental status throughout from the start of the procedure until the patient was taken to the recovery room. The left arm was prepped and draped in the standard fashion for a percutaneous access intervention.  Under ultrasound guidance, the access portion of the left radiocephalic arteriovenous fistula was cannulated with a micropuncture needle under direct ultrasound guidance in a retrograde fashion where it was patent and a permanent image was performed.  The microwire was advanced into the fistula and the needle was exchanged for the a microsheath. Hand injections were completed to image the access including the central venous system. This demonstrated the left radiocephalic anastomosis was widely patent and with compression of the fistula refluxing blood up the radial artery showed this to be widely patent with good filling into the hand as well.  The stent just beyond the anastomosis was widely patent.  The access site was largely aneurysmal but without significant thrombus or stenosis.  There were no other focal stenoses or issues with dual outflow in the upper arm through both the basilic and cephalic veins as well as a patent central venous circulation.  Based on the completion imaging, no further intervention is necessary.  A 4-0 Monocryl purse-string suture was sewn around the sheath.  The sheath was removed while tying down the suture.  A sterile bandage was applied to the puncture site.  COMPLICATIONS: None  CONDITION: Stable   Leotis Pain  10/24/2019 12:35 PM   This note was created with Dragon Medical transcription system. Any errors in dictation are purely unintentional.

## 2019-10-24 NOTE — H&P (Signed)
[facility] History & Physical Update  The patient was interviewed and re-examined.  The patient's previous History and Physical has been reviewed and is unchanged.  There is no change in the plan of care. We plan to proceed with the scheduled procedure.  Leotis Pain, MD  10/24/2019, 10:26 AM

## 2019-10-24 NOTE — Discharge Instructions (Signed)
Moderate Conscious Sedation, Adult, Care After These instructions provide you with information about caring for yourself after your procedure. Your health care provider may also give you more specific instructions. Your treatment has been planned according to current medical practices, but problems sometimes occur. Call your health care provider if you have any problems or questions after your procedure. What can I expect after the procedure? After your procedure, it is common:  To feel sleepy for several hours.  To feel clumsy and have poor balance for several hours.  To have poor judgment for several hours.  To vomit if you eat too soon. Follow these instructions at home: For at least 24 hours after the procedure:   Do not: ? Participate in activities where you could fall or become injured. ? Drive. ? Use heavy machinery. ? Drink alcohol. ? Take sleeping pills or medicines that cause drowsiness. ? Make important decisions or sign legal documents. ? Take care of children on your own.  Rest. Eating and drinking  Follow the diet recommended by your health care provider.  If you vomit: ? Drink water, juice, or soup when you can drink without vomiting. ? Make sure you have little or no nausea before eating solid foods. General instructions  Have a responsible adult stay with you until you are awake and alert.  Take over-the-counter and prescription medicines only as told by your health care provider.  If you smoke, do not smoke without supervision.  Keep all follow-up visits as told by your health care provider. This is important. Contact a health care provider if:  You keep feeling nauseous or you keep vomiting.  You feel light-headed.  You develop a rash.  You have a fever. Get help right away if:  You have trouble breathing. This information is not intended to replace advice given to you by your health care provider. Make sure you discuss any questions you have  with your health care provider. Document Revised: 08/04/2017 Document Reviewed: 12/12/2015 Elsevier Patient Education  2020 Else[employer] Inc[employer] Dialysis Fistulogram, Care After This sheet gives you information about how to care for yourself after your procedure. Your health care provider may also give you more specific instructions. If you have problems or questions, contact your health care provider. What can I expect after the procedure? After the procedure, it is common to have:  A small amount of discomfort in the area where the small, thin tube (catheter) was placed for the procedure.  A small amount of bruising around the fistula.  Sleepiness and tiredness (fatigue). Follow these instructions at home: Activity   Rest at home and do not lift anything that is heavier than 5 lb (2.3 kg) on the day after your procedure.  Return to your normal activities as told by your health care provider. Ask your health care provider what activities are safe for you.  Do not drive or use heavy machinery while taking prescription pain medicine.  Do not drive for 24 hours if you were given a medicine to help you relax (sedative) during your procedure. Medicines   Take over-the-counter and prescription medicines only as told by your health care provider. Puncture site care  Follow instructions from your health care provider about how to take care of the site where catheters were inserted. Make sure you: ? Wash your hands with soap and water before you change your bandage (dressing). If soap and water are not available, use hand sanitizer. ? Change your dressing as told by your health   care provider. ? Leave stitches (sutures), skin glue, or adhesive strips in place. These skin closures may need to stay in place for 2 weeks or longer. If adhesive strip edges start to loosen and curl up, you may trim the loose edges. Do not remove adhesive strips completely unless your health care provider tells you to do  that.  Check your puncture area every day for signs of infection. Check for: ? Redness, swelling, or pain. ? Fluid or blood. ? Warmth. ? Pus or a bad smell. General instructions  Do not take baths, swim, or use a hot tub until your health care provider approves. Ask your health care provider if you may take showers. You may only be allowed to take sponge baths.  Monitor your dialysis fistula closely. Check to make sure that you can feel a vibration or buzz (a thrill) when you put your fingers over the fistula.  Prevent damage to your graft or fistula: ? Do not wear tight-fitting clothing or jewelry on the arm or leg that has your graft or fistula. ? Tell all your health care providers that you have a dialysis fistula or graft. ? Do not allow blood draws, IVs, or blood pressure readings to be done in the arm that has your fistula or graft. ? Do not allow flu shots or vaccinations in the arm with your fistula or graft.  Keep all follow-up visits as told by your health care provider. This is important. Contact a health care provider if:  You have redness, swelling, or pain at the site where the catheter was put in.  You have fluid or blood coming from the catheter site.  The catheter site feels warm to the touch.  You have pus or a bad smell coming from the catheter site.  You have a fever or chills. Get help right away if:  You feel weak.  You have trouble balancing.  You have trouble moving your arms or legs.  You have problems with your speech or vision.  You can no longer feel a vibration or buzz when you put your fingers over your dialysis fistula.  The limb that was used for the procedure: ? Swells. ? Is painful. ? Is cold. ? Is discolored, such as blue or pale white.  You have chest pain or shortness of breath. Summary  After a dialysis fistulogram, it is common to have a small amount of discomfort or bruising in the area where the small, thin tube (catheter)  was placed.  Rest at home on the day after your procedure. Return to your normal activities as told by your health care provider.  Take over-the-counter and prescription medicines only as told by your health care provider.  Follow instructions from your health care provider about how to take care of the site where the catheter was inserted.  Keep all follow-up visits as told by your health care provider. This information is not intended to replace advice given to you by your health care provider. Make sure you discuss any questions you have with your health care provider. Document Revised: 09/22/2017 Document Reviewed: 09/22/2017 Elsevier Patient Education  2020 Elsevier Inc.  

## 2019-10-25 ENCOUNTER — Ambulatory Visit: Payer: Self-pay | Admitting: Physician Assistant

## 2019-10-25 NOTE — Progress Notes (Signed)
10/22/2019 5:31 PM   Dawn Bradford 07-17-1936 [id number]  CC: Voiding trial  HPI: Dawn Bradford is a 84 y.o. female with PMH ESRD on HD who presents today for voiding trial.  She developed acute urinary retention in the setting of fecal impaction on 10/09/2019 with placement of a Foley catheter and subsequent drainage of 1000 mL of urine and manual fecal disimpaction.  ED UA notable for contaminated sample with pyuria and white blood cell clumps; she was discharged on ciprofloxacin 500 mg twice daily x7 days for empiric UTI coverage.  No acute complaints today.   PMH: Past Medical History:  Diagnosis Date  . ACE-inhibitor cough   . Aortic valve disorder    Echo in 2009 showed mean aortic valve gradient of 13 mmHg, suggesting very mild stenosis. Echo (12/10) suggested aortic sclerosis only  . Carotid artery disease (HCC)    mild, carotid dopplers 12/2009  . CHF (congestive heart failure) ([place])   . COPD (chronic obstructive pulmonary disease) ([place])   . Coronary artery disease    s/p anterior MI in 1996 followed by CABG. Lexiscan myoview (4/11): EF 67%, normal perfusion with no evidence for ischemia or infarction.   . Diabetes mellitus    type 2  . Diastolic heart failure    most recent echo (12/10) showed EF 55-60% with mild LVH, grade I diastolic dysfunction, mild LAE.  [name] ESRD (end stage renal disease) on dialysis ([place])   . Hyperlipidemia   . Hypertension    resistant hyptertension times many years. the patient does have renal artery stenosis. she has tried calcium channel blockers in the past and states that she would not take them now because she had some problems with her gums which her dentist identified as calcium-channel blocker side effects.  . Hypothyroidism   . Mild asthma    PFT 02/05/10 FEV1 1.38, FEV1% 73, TLC 3.78 (86%), DLCO 48%, +BD  . Myocardial infarction ([place])   . Obesity   . Obesity hypoventilation syndrome ([place])       . Obstructive sleep apnea    PSG 01/05/2006  AHI 24.8, CPAP 9cm H2O  . Pulmonary nodule, right   . Renal artery stenosis [facility])    The patient has an occluded right renal artery and an atrophic right kidney. There is 20% left renal artery stenosis. This was seen by catheterization in 2007  . Stroke [facility])     Surgical History: Past Surgical History:  Procedure Laterality Date  . A/V FISTULAGRAM N/A 01/25/2018   Procedure: A/V FISTULAGRAM;  Surgeon: Algernon Huxley, MD;  Location: [place] [facility];  Service: Cardiovascular;  Laterality: N/A;  . A/V FISTULAGRAM Left 02/21/2019   Procedure: A/V FISTULAGRAM;  Surgeon: Algernon Huxley, MD;  Location: [place] [facility];  Service: Cardiovascular;  Laterality: Left;  . A/V FISTULAGRAM Left 10/24/2019   Procedure: A/V FISTULAGRAM;  Surgeon: Algernon Huxley, MD;  Location: [place] [facility];  Service: Cardiovascular;  Laterality: Left;  . ABDOMINAL HYSTERECTOMY  1985  . AV FISTULA PLACEMENT    . CATARACT EXTRACTION  2010  . CORONARY ARTERY BYPASS GRAFT  1996  . DIALYSIS FISTULA CREATION    . [place]   right  . INTRAMEDULLARY (IM) NAIL INTERTROCHANTERIC Right 08/17/2018   Procedure: INTRAMEDULLARY (IM) NAIL INTERTROCHANTRIC;  Surgeon: Earnestine Leys, MD;  Location: [facility] ORS;  Service: Orthopedics;  Laterality: Right;  . PERIPHERAL VASCULAR BALLOON ANGIOPLASTY  02/21/2019   Procedure: PERIPHERAL VASCULAR  BALLOON ANGIOPLASTY;  Surgeon: Algernon Huxley, MD;  Location: [place] [facility];  Service: Cardiovascular;;  . PERIPHERAL VASCULAR CATHETERIZATION Left 01/06/2015   Procedure: A/V Shuntogram/Fistulagram;  Surgeon: Katha Cabal, MD;  Location: [place] [facility];  Service: Cardiovascular;  Laterality: Left;  . PERIPHERAL VASCULAR CATHETERIZATION N/A 02/17/2015   Procedure: Dialysis/Perma Catheter Removal;  Surgeon: Katha Cabal, MD;  Location: [place] [facility];  Service: Cardiovascular;  Laterality: N/A;  . PERIPHERAL VASCULAR CATHETERIZATION N/A 01/28/2016    Procedure: A/V Shuntogram/Fistulagram;  Surgeon: Algernon Huxley, MD;  Location: [place] [facility];  Service: Cardiovascular;  Laterality: N/A;  . PERIPHERAL VASCULAR CATHETERIZATION N/A 01/28/2016   Procedure: A/V Shunt Intervention;  Surgeon: Algernon Huxley, MD;  Location: [place] [facility];  Service: Cardiovascular;  Laterality: N/A;  . TUBAL LIGATION  1973    Home Medications:  Allergies as of 10/22/2019      Reactions   Ace Inhibitors Other (See Comments)   Reaction:  Unknown    Amlodipine Other (See Comments)   Reaction:  Unknown    Clonidine Hydrochloride Other (See Comments)   Reaction:  Unknown    Codeine Hives   Latex Hives   Metformin Nausea And Vomiting   Tape Rash      Medication List       Accurate as of October 22, 2019 11:59 PM. If you have any questions, ask your nurse or doctor.        acetaminophen 325 MG tablet Commonly known as: TYLENOL Take 2 tablets (650 mg total) by mouth every 6 (six) hours as needed for mild pain, fever or headache (or Fever >/= 101).   albuterol 108 (90 Base) MCG/ACT inhaler Commonly known as: VENTOLIN HFA Inhale 2 puffs into the lungs every 6 (six) hours as needed for wheezing or shortness of breath.   b complex-vitamin c-folic acid 0.8 MG Tabs tablet Take 1 tablet by mouth daily.   carvedilol 25 MG tablet Commonly known as: COREG Take 25 mg by mouth 2 (two) times daily.   DERMACLOUD EX Apply liberal amount topically to area of skin irritation as needed.  OK to leave at bedside   feeding supplement (PRO-STAT SUGAR FREE 64) Liqd Take 30 mLs by mouth 2 (two) times daily.   ferrous fumarate-iron polysaccharide complex 162-115.2 MG Caps capsule Commonly known as: TANDEM Take 1 capsule by mouth daily.   ferrous sulfate 325 (65 FE) MG tablet Take 325 mg by mouth daily with breakfast.   FLUoxetine 40 MG capsule Commonly known as: PROZAC Take 50 mg by mouth daily.   Fluticasone-Salmeterol 100-50 MCG/DOSE Aepb Commonly  known as: ADVAIR Inhale 1 puff into the lungs 2 (two) times daily.   furosemide 20 MG tablet Commonly known as: LASIX Take 20 mg by mouth once a week. Give on Thursday   furosemide 80 MG tablet Commonly known as: LASIX Give 1 tablet by mouth daily on Sunday, Tuesday and Saturday   hydrALAZINE 50 MG tablet Commonly known as: APRESOLINE Take 50 mg by mouth 2 (two) times daily.   ipratropium 0.02 % nebulizer solution Commonly known as: ATROVENT Take 0.5 mg by nebulization every 6 (six) hours as needed for wheezing or shortness of breath.   isosorbide mononitrate 30 MG 24 hr tablet Commonly known as: IMDUR Take 30 mg by mouth at bedtime.   levothyroxine 75 MCG tablet Commonly known as: SYNTHROID Take 75 mcg by mouth daily.   lidocaine-prilocaine cream Commonly known as: EMLA  Apply 1 application topically as needed (for pain).   Melatonin 5 MG Tabs Take 5 mg by mouth at bedtime.   methocarbamol 500 MG tablet Commonly known as: ROBAXIN Take 500 mg by mouth every 8 (eight) hours as needed for muscle spasms.   midodrine 5 MG tablet Commonly known as: PROAMATINE Take 1 tablet (5 mg total) by mouth 2 (two) times daily with a meal.   NON FORMULARY Diet Type:  Renal, NAS, NCS, 1200 cc fluid restriction   nystatin powder Generic drug: nystatin Apply 1 g topically as needed (for irritation).   omeprazole 10 MG capsule Commonly known as: PRILOSEC Take 10 mg by mouth daily.   ondansetron 4 MG tablet Commonly known as: ZOFRAN Take 4 mg by mouth 2 (two) times daily as needed for nausea or vomiting.   OXYGEN Inhale 2 L/min into the lungs continuous.   polyethylene glycol 17 g packet Commonly known as: MIRALAX / GLYCOLAX Take 17 g by mouth daily.   rosuvastatin 40 MG tablet Commonly known as: CRESTOR Take 40 mg by mouth every evening.   Senna Plus 8.6-50 MG Caps Generic drug: Sennosides-Docusate Sodium Take 2 capsules by mouth 2 (two) times daily as needed.     telmisartan 80 MG tablet Commonly known as: MICARDIS Take 80 mg by mouth daily. At night   traMADol 50 MG tablet Commonly known as: ULTRAM Take 50 mg by mouth every 6 (six) hours as needed.   traZODone 100 MG tablet Commonly known as: DESYREL Take 100 mg by mouth at bedtime.   triamcinolone cream 0.5 % Commonly known as: KENALOG Apply 1 application topically as needed (for irritation).   UNABLE TO FIND CPAP @@ Bedtime   Vitamin D3 125 MCG (5000 UT) Tabs Take 5,000 Units by mouth daily.       Allergies:  Allergies  Allergen Reactions  . Ace Inhibitors Other (See Comments)    Reaction:  Unknown   . Amlodipine Other (See Comments)    Reaction:  Unknown   . Clonidine Hydrochloride Other (See Comments)    Reaction:  Unknown   . Codeine Hives  . Latex Hives  . Metformin Nausea And Vomiting  . Tape Rash    Family History: Family History  Problem Relation Age of Onset  . Kidney failure Mother   . Diabetes Mother   . Aortic aneurysm Father   . Coronary artery disease Father   . Heart attack Father   . Diabetes Sister   . Diabetes Brother   . Kidney failure Brother     Social History:   reports that she has never smoked. She has never used smokeless tobacco. She reports that she does not drink alcohol or use drugs.  Physical Exam: BP 129/71 (BP Location: Right Arm, Patient Position: Sitting, Cuff Size: Large)   Pulse 71   LMP 02/01/1986 (Approximate)   Constitutional:  Alert and oriented, no acute distress, nontoxic appearing HEENT: [place], AT Cardiovascular: No clubbing, cyanosis, or edema Respiratory: Normal respiratory effort, no increased work of breathing Skin: No rashes, bruises or suspicious lesions Neurologic: Requires heavy assist with ambulation and transfer  Laboratory Data: Results for orders placed or performed in visit on 10/22/19  Bladder Scan (Post Void Residual) in office  Result Value Ref Range   Scan Result 258mL    Assessment & Plan:    1. Incomplete bladder emptying 84 year old comorbid female with recent development of acute urinary retention in the setting of fecal impaction presents today for voiding  trial.  See separate procedure note for details.  Foley catheter removed in the morning.  Upon repeat visit this afternoon, bladder scan was elevated at 200 mL.  She reported that she had been able to urinate, however did not feel that she had emptied adequately.  I had a lengthy conversation with the patient, her caregiver, and her daughter via telephone in which we had originally decided to proceed with catheter replacement with repeat voiding trial in 7 to 10 days.  However, during the course of the conversation, patient reported that she needed to have a bowel movement.  She proceeded to have a bowel movement on the potty chair accompanied with urination of approximately 200 mL of urine.  Based on this, I strongly suspect bowel-bladder dysfunction in this patient.  I will defer Foley catheter placement today and would like her to follow-up in our clinic within 1 week for repeat PVR to ensure continued bladder emptying.  Of note, patient's caregiver notes that she has just been started on a daily MiraLAX regimen.  I encouraging them to continue to aggressively manage her bowels and explained that constipation will drastically increase her risk of recurrent urinary retention moving forward.   I counseled the patient on signs and symptoms of urinary retention, including lower abdominal pain, lumbar pain, abdominal distention, and the inability to urinate.  I advised her to contact the office for assistance if she develops these symptoms during routine office hours, 8 AM to 5 PM Monday through Friday.  If outside those hours, I advised her  to proceed to the emergency department. She expressed understanding. - Bladder Scan (Post Void Residual) in office  Return in about 1 week (around 10/29/2019) for Repeat PVR.   I spent 50 minutes  on the day of the encounter to include pre-visit record review, face-to-face time with the patient, and post-visit ordering of tests.   Debroah Loop, PA-C  [facility] [address] [place], [place] [address] [phone]

## 2019-10-29 ENCOUNTER — Encounter: Payer: Self-pay | Admitting: Physician Assistant

## 2019-10-29 ENCOUNTER — Other Ambulatory Visit: Payer: Self-pay

## 2019-10-29 ENCOUNTER — Ambulatory Visit (INDEPENDENT_AMBULATORY_CARE_PROVIDER_SITE_OTHER): Payer: Medicare Other | Admitting: Physician Assistant

## 2019-10-29 VITALS — BP 130/70 | HR 78 | Ht 60.0 in | Wt 250.0 lb

## 2019-10-29 DIAGNOSIS — R339 Retention of urine, unspecified: Secondary | ICD-10-CM | POA: Diagnosis not present

## 2019-10-29 LAB — BLADDER SCAN AMB NON-IMAGING: Scan Result: 458

## 2019-10-29 NOTE — Progress Notes (Signed)
Simple Catheter Placement  Due to urinary retention patient is present today for a foley cath placement.  Patient was cleaned and prepped in a sterile fashion with betadine. A 16 FR foley catheter was inserted, urine return was noted  460ml, urine was yellow in color.  The balloon was filled with 10cc of sterile water.  A leg bag was attached for drainage. Patient was given an additional leg bag to take home and was given instruction on how to change from one bag to another.  Patient was given instruction on proper catheter care.  Patient tolerated well, no complications were noted   Performed by: Debroah Loop, PA-C

## 2019-10-29 NOTE — Progress Notes (Signed)
10/29/2019 4:30 PM   Dawn Bradford Apr 06, 1936 [id number]  CC: Repeat PVR  HPI: Dawn Bradford is a 84 y.o. female with PMH DM 2, ESRD on HD who presents today for repeat PVR.  I last saw her in clinic 1 week ago for a voiding trial after she developed acute urinary retention in the setting of fecal impaction requiring Foley catheter placement.  She was ultimately able to empty her bladder with a bowel movement, however I elected to have her follow-up in clinic with me today for repeat PVR to ensure that she did not go back into retention.  In the interim, patient has no acute concerns.  She is accompanied today by her caregiver, who states she has been urinating.  She denies lower abdominal pain.  She has continued her MiraLAX regimen and denies constipation.  PVR 46mL.  Additionally, I spoke with the patient's daughter, Dawn Bradford, via telephone today.  Deanna reports that her mother has a history of straining to urinate and she wonders if incomplete bladder emptying is not a new problem for her mother.  PMH: Past Medical History:  Diagnosis Date  . ACE-inhibitor cough   . Aortic valve disorder    Echo in 2009 showed mean aortic valve gradient of 13 mmHg, suggesting very mild stenosis. Echo (12/10) suggested aortic sclerosis only  . Carotid artery disease (HCC)    mild, carotid dopplers 12/2009  . CHF (congestive heart failure) ([place])   . COPD (chronic obstructive pulmonary disease) ([place])   . Coronary artery disease    s/p anterior MI in 1996 followed by CABG. Lexiscan myoview (4/11): EF 67%, normal perfusion with no evidence for ischemia or infarction.   . Diabetes mellitus    type 2  . Diastolic heart failure    most recent echo (12/10) showed EF 55-60% with mild LVH, grade I diastolic dysfunction, mild LAE.  [name] ESRD (end stage renal disease) on dialysis ([place])   . Hyperlipidemia   . Hypertension    resistant hyptertension times many years. the patient does have renal artery  stenosis. she has tried calcium channel blockers in the past and states that she would not take them now because she had some problems with her gums which her dentist identified as calcium-channel blocker side effects.  . Hypothyroidism   . Mild asthma    PFT 02/05/10 FEV1 1.38, FEV1% 73, TLC 3.78 (86%), DLCO 48%, +BD  . Myocardial infarction ([place])   . Obesity   . Obesity hypoventilation syndrome ([place])       . Obstructive sleep apnea    PSG 01/05/2006 AHI 24.8, CPAP 9cm H2O  . Pulmonary nodule, right   . Renal artery stenosis [facility])    The patient has an occluded right renal artery and an atrophic right kidney. There is 20% left renal artery stenosis. This was seen by catheterization in 2007  . Stroke [facility])     Surgical History: Past Surgical History:  Procedure Laterality Date  . A/V FISTULAGRAM N/A 01/25/2018   Procedure: A/V FISTULAGRAM;  Surgeon: Algernon Huxley, MD;  Location: [place] [facility];  Service: Cardiovascular;  Laterality: N/A;  . A/V FISTULAGRAM Left 02/21/2019   Procedure: A/V FISTULAGRAM;  Surgeon: Algernon Huxley, MD;  Location: [place] [facility];  Service: Cardiovascular;  Laterality: Left;  . A/V FISTULAGRAM Left 10/24/2019   Procedure: A/V FISTULAGRAM;  Surgeon: Algernon Huxley, MD;  Location: [place] [facility];  Service: Cardiovascular;  Laterality: Left;  .  ABDOMINAL HYSTERECTOMY  1985  . AV FISTULA PLACEMENT    . CATARACT EXTRACTION  2010  . CORONARY ARTERY BYPASS GRAFT  1996  . DIALYSIS FISTULA CREATION    . [place]   right  . INTRAMEDULLARY (IM) NAIL INTERTROCHANTERIC Right 08/17/2018   Procedure: INTRAMEDULLARY (IM) NAIL INTERTROCHANTRIC;  Surgeon: Earnestine Leys, MD;  Location: [facility] ORS;  Service: Orthopedics;  Laterality: Right;  . PERIPHERAL VASCULAR BALLOON ANGIOPLASTY  02/21/2019   Procedure: PERIPHERAL VASCULAR BALLOON ANGIOPLASTY;  Surgeon: Algernon Huxley, MD;  Location: [place] [facility];  Service: Cardiovascular;;  . PERIPHERAL  VASCULAR CATHETERIZATION Left 01/06/2015   Procedure: A/V Shuntogram/Fistulagram;  Surgeon: Katha Cabal, MD;  Location: [place] [facility];  Service: Cardiovascular;  Laterality: Left;  . PERIPHERAL VASCULAR CATHETERIZATION N/A 02/17/2015   Procedure: Dialysis/Perma Catheter Removal;  Surgeon: Katha Cabal, MD;  Location: [place] [facility];  Service: Cardiovascular;  Laterality: N/A;  . PERIPHERAL VASCULAR CATHETERIZATION N/A 01/28/2016   Procedure: A/V Shuntogram/Fistulagram;  Surgeon: Algernon Huxley, MD;  Location: [place] [facility];  Service: Cardiovascular;  Laterality: N/A;  . PERIPHERAL VASCULAR CATHETERIZATION N/A 01/28/2016   Procedure: A/V Shunt Intervention;  Surgeon: Algernon Huxley, MD;  Location: [place] [facility] LAB;  Service: Cardiovascular;  Laterality: N/A;  . TUBAL LIGATION  1973    Home Medications:  Allergies as of 10/29/2019      Reactions   Ace Inhibitors Other (See Comments)   Reaction:  Unknown    Amlodipine Other (See Comments)   Reaction:  Unknown    Clonidine Hydrochloride Other (See Comments)   Reaction:  Unknown    Codeine Hives   Latex Hives   Metformin Nausea And Vomiting   Tape Rash      Medication List       Accurate as of October 29, 2019  4:30 PM. If you have any questions, ask your nurse or doctor.        acetaminophen 325 MG tablet Commonly known as: TYLENOL Take 2 tablets (650 mg total) by mouth every 6 (six) hours as needed for mild pain, fever or headache (or Fever >/= 101).   albuterol 108 (90 Base) MCG/ACT inhaler Commonly known as: VENTOLIN HFA Inhale 2 puffs into the lungs every 6 (six) hours as needed for wheezing or shortness of breath.   b complex-vitamin c-folic acid 0.8 MG Tabs tablet Take 1 tablet by mouth daily.   carvedilol 25 MG tablet Commonly known as: COREG Take 25 mg by mouth 2 (two) times daily.   DERMACLOUD EX Apply liberal amount topically to area of skin irritation as needed.  OK to leave at bedside    feeding supplement (PRO-STAT SUGAR FREE 64) Liqd Take 30 mLs by mouth 2 (two) times daily.   ferrous fumarate-iron polysaccharide complex 162-115.2 MG Caps capsule Commonly known as: TANDEM Take 1 capsule by mouth daily.   ferrous sulfate 325 (65 FE) MG tablet Take 325 mg by mouth daily with breakfast.   FLUoxetine 40 MG capsule Commonly known as: PROZAC Take 50 mg by mouth daily.   Fluticasone-Salmeterol 100-50 MCG/DOSE Aepb Commonly known as: ADVAIR Inhale 1 puff into the lungs 2 (two) times daily.   furosemide 20 MG tablet Commonly known as: LASIX Take 20 mg by mouth once a week. Give on Thursday   furosemide 80 MG tablet Commonly known as: LASIX Give 1 tablet by mouth daily on Sunday, Tuesday and Saturday   hydrALAZINE 50 MG  tablet Commonly known as: APRESOLINE Take 50 mg by mouth 2 (two) times daily.   ipratropium 0.02 % nebulizer solution Commonly known as: ATROVENT Take 0.5 mg by nebulization every 6 (six) hours as needed for wheezing or shortness of breath.   isosorbide mononitrate 30 MG 24 hr tablet Commonly known as: IMDUR Take 30 mg by mouth at bedtime.   levothyroxine 75 MCG tablet Commonly known as: SYNTHROID Take 75 mcg by mouth daily.   lidocaine-prilocaine cream Commonly known as: EMLA Apply 1 application topically as needed (for pain).   Melatonin 5 MG Tabs Take 5 mg by mouth at bedtime.   methocarbamol 500 MG tablet Commonly known as: ROBAXIN Take 500 mg by mouth every 8 (eight) hours as needed for muscle spasms.   midodrine 5 MG tablet Commonly known as: PROAMATINE Take 1 tablet (5 mg total) by mouth 2 (two) times daily with a meal.   NON FORMULARY Diet Type:  Renal, NAS, NCS, 1200 cc fluid restriction   nystatin powder Generic drug: nystatin Apply 1 g topically as needed (for irritation).   omeprazole 10 MG capsule Commonly known as: PRILOSEC Take 10 mg by mouth daily.   ondansetron 4 MG tablet Commonly known as: ZOFRAN Take  4 mg by mouth 2 (two) times daily as needed for nausea or vomiting.   OXYGEN Inhale 2 L/min into the lungs continuous.   polyethylene glycol 17 g packet Commonly known as: MIRALAX / GLYCOLAX Take 17 g by mouth daily.   rosuvastatin 40 MG tablet Commonly known as: CRESTOR Take 40 mg by mouth every evening.   Senna Plus 8.6-50 MG Caps Generic drug: Sennosides-Docusate Sodium Take 2 capsules by mouth 2 (two) times daily as needed.   telmisartan 80 MG tablet Commonly known as: MICARDIS Take 80 mg by mouth daily. At night   traMADol 50 MG tablet Commonly known as: ULTRAM Take 50 mg by mouth every 6 (six) hours as needed.   traZODone 100 MG tablet Commonly known as: DESYREL Take 100 mg by mouth at bedtime.   triamcinolone cream 0.5 % Commonly known as: KENALOG Apply 1 application topically as needed (for irritation).   UNABLE TO FIND CPAP @@ Bedtime   Vitamin D3 125 MCG (5000 UT) Tabs Take 5,000 Units by mouth daily.       Allergies:  Allergies  Allergen Reactions  . Ace Inhibitors Other (See Comments)    Reaction:  Unknown   . Amlodipine Other (See Comments)    Reaction:  Unknown   . Clonidine Hydrochloride Other (See Comments)    Reaction:  Unknown   . Codeine Hives  . Latex Hives  . Metformin Nausea And Vomiting  . Tape Rash    Family History: Family History  Problem Relation Age of Onset  . Kidney failure Mother   . Diabetes Mother   . Aortic aneurysm Father   . Coronary artery disease Father   . Heart attack Father   . Diabetes Sister   . Diabetes Brother   . Kidney failure Brother     Social History:   reports that she has never smoked. She has never used smokeless tobacco. She reports that she does not drink alcohol or use drugs.  Physical Exam: BP 130/70   Pulse 78   Ht 5' (1.524 m)   Wt 250 lb (113.4 kg)   LMP 02/01/1986 (Approximate)   BMI 48.82 kg/m   Constitutional:  Alert and oriented, no acute distress, nontoxic  appearing HEENT: [place], AT  Cardiovascular: No clubbing, cyanosis, or edema Respiratory: Normal respiratory effort, no increased work of breathing Skin: No rashes, bruises or suspicious lesions Neurologic: Grossly intact, no focal deficits, moving all 4 extremities Psychiatric: Normal mood and affect  Laboratory Data: Results for orders placed or performed in visit on 10/29/19  Bladder Scan (Post Void Residual) in office  Result Value Ref Range   Scan Result 458    Assessment & Plan:   1. Incomplete bladder emptying 84 year old comorbid female presents today for repeat PVR following recent episode of acute urinary retention in the setting of fecal impaction.  PVR significantly elevated today.  I had a lengthy conversation with the patient, her daughter, and her caregiver today regarding her options moving forward.  I offered replacement of a Foley catheter with plans for repeat voiding trial in 2 weeks, replacement of a Foley catheter with plans for chronic Foley catheterization, and CIC teaching today.  Ultimately, patient and family elected to proceed with Foley catheter with plans for chronic Foley catheterization.  Foley catheter replaced today; see separate procedure note for details. - Bladder Scan (Post Void Residual) in office  Return in about 4 weeks (around 11/26/2019) for Catheter exchange.  Debroah Loop, PA-C  [facility] [address] [place], [place] [address] [phone]

## 2019-11-04 ENCOUNTER — Telehealth: Payer: Self-pay | Admitting: Nurse Practitioner

## 2019-11-04 ENCOUNTER — Other Ambulatory Visit: Payer: Self-pay

## 2019-11-04 ENCOUNTER — Other Ambulatory Visit: Payer: Medicare Other | Admitting: Nurse Practitioner

## 2019-11-04 NOTE — Telephone Encounter (Signed)
I attempted to contact Deanna, Ms. Heick daughter for scheduled telemedicine follow-up palliative care visit, no answer, message left with contact informaton

## 2019-11-15 ENCOUNTER — Telehealth: Payer: Self-pay | Admitting: Nurse Practitioner

## 2019-11-15 NOTE — Telephone Encounter (Signed)
Spoke with patient's daughter Ermalinda Joubert and have schedule a Telephone Palliative f/u visit for 12/10/19 @ 11 AM.

## 2019-11-22 ENCOUNTER — Other Ambulatory Visit: Payer: Self-pay

## 2019-11-22 ENCOUNTER — Ambulatory Visit (INDEPENDENT_AMBULATORY_CARE_PROVIDER_SITE_OTHER): Payer: Medicare Other | Admitting: Urology

## 2019-11-22 ENCOUNTER — Encounter: Payer: Self-pay | Admitting: Urology

## 2019-11-22 VITALS — BP 140/80 | HR 74 | Ht 65.0 in | Wt 250.0 lb

## 2019-11-22 DIAGNOSIS — R339 Retention of urine, unspecified: Secondary | ICD-10-CM

## 2019-11-22 MED ORDER — OXYBUTYNIN CHLORIDE 5 MG PO TABS: 5.0000 mg | ORAL_TABLET | Freq: Three times a day (TID) | ORAL | 0 refills | Status: DC | PRN

## 2019-11-22 NOTE — Progress Notes (Signed)
Cath Change/ Replacement  Patient is present today for a catheter change due to urinary retention.  10 ml of water was removed from the balloon, a 16 FR foley cath was removed with out difficulty.  Patient was cleaned and prepped in a sterile fashion with betadine. A 16 FR foley cath was replaced into the bladder no complications were noted Urine return was noted 50 ml and urine was yellow, clear in color. The balloon was filled with 48ml of sterile water. A leg bag was attached for drainage.  A night bag was also given to the patient and patient was given instruction on how to change from one bag to another. Patient was given proper instruction on catheter care.    Performed by: Zara Council, PA-C and Gaspar Cola, CMA  Follow up: One month follow up   Oxybutynin 5 mg every 8 hours as needed for bladder spasm is given.

## 2019-11-25 ENCOUNTER — Other Ambulatory Visit: Payer: Self-pay

## 2019-11-25 ENCOUNTER — Encounter: Payer: Self-pay | Admitting: Urology

## 2019-11-25 ENCOUNTER — Ambulatory Visit (INDEPENDENT_AMBULATORY_CARE_PROVIDER_SITE_OTHER): Payer: Medicare Other | Admitting: Urology

## 2019-11-25 VITALS — BP 117/65 | HR 64 | Ht 64.0 in

## 2019-11-25 DIAGNOSIS — N3289 Other specified disorders of bladder: Secondary | ICD-10-CM | POA: Diagnosis not present

## 2019-11-25 DIAGNOSIS — R339 Retention of urine, unspecified: Secondary | ICD-10-CM | POA: Diagnosis not present

## 2019-11-25 MED ORDER — HYOSCYAMINE SULFATE 0.125 MG PO TABS: 0.1250 mg | ORAL_TABLET | ORAL | 0 refills | Status: DC | PRN

## 2019-11-25 NOTE — Progress Notes (Signed)
11/25/2019 4:27 PM   Dawn Bradford 1935-12-11 [id number]  Referring provider: Hortencia Pilar, [address] [place],  [place] [id number]  Chief Complaint  Patient presents with  . Urinary Retention    HPI: Dawn Bradford is an 84 year old female with ESRD on dialysis with urinary retention who presents today for pain with Foley with her daughter, [name].     Patient was initially seen by Dr. Matilde Sprang on October 21, 2019 after it was discovered she had moderate left hydronephrosis associated with urinary retention.  A Foley catheter was placed at that time and had a return of 1 L.  She was also disimpacted at the time.    She was seen back on October 22, 2019 with Sam for voiding trial.  Her catheter was removed in the morning and she stated she was urinating and PVR found residual of 200 mL.    She returned 1 week later on October 29, 2019 with Sam and was found to have a PVR of 450 cc.  A Foley catheter was placed after lengthy discussion with daughter with plans to move forward with a chronic indwelling Foley.  On November 22, 2019, the caregiver contacted the office stating that Dawn Bradford had been having episodes of intense bladder pain followed with leakage into her depends.  The caregiver stated that they had not noted any gross hematuria or sediment in the urine.  The Foley was exchanged at that visit and Dawn Bradford was prescribed Ditropan immediate release 5 mg 3 times daily as needed for bladder spasms.  Today, the caregiver and daughter state that Dawn Bradford had a terrible weekend.  They stated that last night especially she was in so much pain she was grabbing the bed rails and writhing in bed.  The Ditropan did not provide relief of this pain.  She continued to have leakage around the catheter into the depends.  They denied any developments of gross hematuria or sediment.  Patient denies any modifying or aggravating factors.  Patient denies any fevers, chills, nausea or vomiting.    PMH: Past Medical History:  Diagnosis Date  . ACE-inhibitor cough   . Aortic valve disorder    Echo in 2009 showed mean aortic valve gradient of 13 mmHg, suggesting very mild stenosis. Echo (12/10) suggested aortic sclerosis only  . Carotid artery disease (HCC)    mild, carotid dopplers 12/2009  . CHF (congestive heart failure) ([place])   . COPD (chronic obstructive pulmonary disease) ([place])   . Coronary artery disease    s/p anterior MI in 1996 followed by CABG. Lexiscan myoview (4/11): EF 67%, normal perfusion with no evidence for ischemia or infarction.   . Diabetes mellitus    type 2  . Diastolic heart failure    most recent echo (12/10) showed EF 55-60% with mild LVH, grade I diastolic dysfunction, mild LAE.  [name] ESRD (end stage renal disease) on dialysis ([place])   . Hyperlipidemia   . Hypertension    resistant hyptertension times many years. the patient does have renal artery stenosis. she has tried calcium channel blockers in the past and states that she would not take them now because she had some problems with her gums which her dentist identified as calcium-channel blocker side effects.  . Hypothyroidism   . Mild asthma    PFT 02/05/10 FEV1 1.38, FEV1% 73, TLC 3.78 (86%), DLCO 48%, +BD  . Myocardial infarction ([place])   . Obesity   . Obesity  hypoventilation syndrome ([place])       . Obstructive sleep apnea    PSG 01/05/2006 AHI 24.8, CPAP 9cm H2O  . Pulmonary nodule, right   . Renal artery stenosis [facility])    The patient has an occluded right renal artery and an atrophic right kidney. There is 20% left renal artery stenosis. This was seen by catheterization in 2007  . Stroke [facility])     Surgical History: Past Surgical History:  Procedure Laterality Date  . A/V FISTULAGRAM N/A 01/25/2018   Procedure: A/V FISTULAGRAM;  Surgeon: Algernon Huxley, MD;  Location: [place] [facility];  Service: Cardiovascular;  Laterality: N/A;  . A/V FISTULAGRAM Left 02/21/2019   Procedure: A/V FISTULAGRAM;   Surgeon: Algernon Huxley, MD;  Location: [place] [facility];  Service: Cardiovascular;  Laterality: Left;  . A/V FISTULAGRAM Left 10/24/2019   Procedure: A/V FISTULAGRAM;  Surgeon: Algernon Huxley, MD;  Location: [place] [facility];  Service: Cardiovascular;  Laterality: Left;  . ABDOMINAL HYSTERECTOMY  1985  . AV FISTULA PLACEMENT    . CATARACT EXTRACTION  2010  . CORONARY ARTERY BYPASS GRAFT  1996  . DIALYSIS FISTULA CREATION    . [place]   right  . INTRAMEDULLARY (IM) NAIL INTERTROCHANTERIC Right 08/17/2018   Procedure: INTRAMEDULLARY (IM) NAIL INTERTROCHANTRIC;  Surgeon: Earnestine Leys, MD;  Location: [facility];  Service: Orthopedics;  Laterality: Right;  . PERIPHERAL VASCULAR BALLOON ANGIOPLASTY  02/21/2019   Procedure: PERIPHERAL VASCULAR BALLOON ANGIOPLASTY;  Surgeon: Algernon Huxley, MD;  Location: [place] [facility];  Service: Cardiovascular;;  . PERIPHERAL VASCULAR CATHETERIZATION Left 01/06/2015   Procedure: A/V Shuntogram/Fistulagram;  Surgeon: Katha Cabal, MD;  Location: [place] [facility];  Service: Cardiovascular;  Laterality: Left;  . PERIPHERAL VASCULAR CATHETERIZATION N/A 02/17/2015   Procedure: Dialysis/Perma Catheter Removal;  Surgeon: Katha Cabal, MD;  Location: [place] [facility];  Service: Cardiovascular;  Laterality: N/A;  . PERIPHERAL VASCULAR CATHETERIZATION N/A 01/28/2016   Procedure: A/V Shuntogram/Fistulagram;  Surgeon: Algernon Huxley, MD;  Location: [place] [facility];  Service: Cardiovascular;  Laterality: N/A;  . PERIPHERAL VASCULAR CATHETERIZATION N/A 01/28/2016   Procedure: A/V Shunt Intervention;  Surgeon: Algernon Huxley, MD;  Location: [place] [facility];  Service: Cardiovascular;  Laterality: N/A;  . TUBAL LIGATION  1973    Home Medications:  Allergies as of 11/25/2019      Reactions   Ace Inhibitors Other (See Comments)   Reaction:  Unknown    Amlodipine Other (See Comments)   Reaction:  Unknown    Clonidine Hydrochloride Other  (See Comments)   Reaction:  Unknown    Codeine Hives   Latex Hives   Metformin Nausea And Vomiting   Tape Rash      Medication List       Accurate as of November 25, 2019 11:59 PM. If you have any questions, ask your nurse or doctor.        acetaminophen 325 MG tablet Commonly known as: TYLENOL Take 2 tablets (650 mg total) by mouth every 6 (six) hours as needed for mild pain, fever or headache (or Fever >/= 101).   albuterol 108 (90 Base) MCG/ACT inhaler Commonly known as: VENTOLIN HFA Inhale 2 puffs into the lungs every 6 (six) hours as needed for wheezing or shortness of breath.   b complex-vitamin c-folic acid 0.8 MG Tabs tablet Take 1 tablet by mouth daily.   carvedilol 25 MG tablet Commonly known  as: COREG Take 25 mg by mouth 2 (two) times daily.   DERMACLOUD EX Apply liberal amount topically to area of skin irritation as needed.  OK to leave at bedside   feeding supplement (PRO-STAT SUGAR FREE 64) Liqd Take 30 mLs by mouth 2 (two) times daily.   ferrous fumarate-iron polysaccharide complex 162-115.2 MG Caps capsule Commonly known as: TANDEM Take 1 capsule by mouth daily.   ferrous sulfate 325 (65 FE) MG tablet Take 325 mg by mouth daily with breakfast.   FLUoxetine 40 MG capsule Commonly known as: PROZAC Take 50 mg by mouth daily.   Fluticasone-Salmeterol 100-50 MCG/DOSE Aepb Commonly known as: ADVAIR Inhale 1 puff into the lungs 2 (two) times daily.   furosemide 20 MG tablet Commonly known as: LASIX Take 20 mg by mouth once a week. Give on Thursday   furosemide 80 MG tablet Commonly known as: LASIX Give 1 tablet by mouth daily on Sunday, Tuesday and Saturday   hydrALAZINE 50 MG tablet Commonly known as: APRESOLINE Take 50 mg by mouth 2 (two) times daily.   hyoscyamine 0.125 MG tablet Commonly known as: Levsin Take 1 tablet (0.125 mg total) by mouth every 4 (four) hours as needed. Started by: Zara Council, PA-C   ipratropium 0.02 % nebulizer  solution Commonly known as: ATROVENT Take 0.5 mg by nebulization every 6 (six) hours as needed for wheezing or shortness of breath.   isosorbide mononitrate 30 MG 24 hr tablet Commonly known as: IMDUR Take 30 mg by mouth at bedtime.   levothyroxine 75 MCG tablet Commonly known as: SYNTHROID Take 75 mcg by mouth daily.   lidocaine-prilocaine cream Commonly known as: EMLA Apply 1 application topically as needed (for pain).   melatonin 5 MG Tabs Take 5 mg by mouth at bedtime.   methocarbamol 500 MG tablet Commonly known as: ROBAXIN Take 500 mg by mouth every 8 (eight) hours as needed for muscle spasms.   midodrine 5 MG tablet Commonly known as: PROAMATINE Take 1 tablet (5 mg total) by mouth 2 (two) times daily with a meal.   NON FORMULARY Diet Type:  Renal, NAS, NCS, 1200 cc fluid restriction   nystatin powder Generic drug: nystatin Apply 1 g topically as needed (for irritation).   omeprazole 10 MG capsule Commonly known as: PRILOSEC Take 10 mg by mouth daily.   ondansetron 4 MG tablet Commonly known as: ZOFRAN Take 4 mg by mouth 2 (two) times daily as needed for nausea or vomiting.   oxybutynin 5 MG tablet Commonly known as: DITROPAN Take 1 tablet (5 mg total) by mouth every 8 (eight) hours as needed for bladder spasms. Do not take more than three tablets daily and not more than every 8 hours for spasms.   OXYGEN Inhale 2 L/min into the lungs continuous.   polyethylene glycol 17 g packet Commonly known as: MIRALAX / GLYCOLAX Take 17 g by mouth daily.   rosuvastatin 40 MG tablet Commonly known as: CRESTOR Take 40 mg by mouth every evening.   Senna Plus 8.6-50 MG Caps Generic drug: Sennosides-Docusate Sodium Take 2 capsules by mouth 2 (two) times daily as needed.   telmisartan 80 MG tablet Commonly known as: MICARDIS Take 80 mg by mouth daily. At night   traMADol 50 MG tablet Commonly known as: ULTRAM Take 50 mg by mouth every 6 (six) hours as needed.     traZODone 100 MG tablet Commonly known as: DESYREL Take 100 mg by mouth at bedtime.   triamcinolone cream  0.5 % Commonly known as: KENALOG Apply 1 application topically as needed (for irritation).   UNABLE TO FIND CPAP @@ Bedtime   Vitamin D3 125 MCG (5000 UT) Tabs Take 5,000 Units by mouth daily.       Allergies:  Allergies  Allergen Reactions  . Ace Inhibitors Other (See Comments)    Reaction:  Unknown   . Amlodipine Other (See Comments)    Reaction:  Unknown   . Clonidine Hydrochloride Other (See Comments)    Reaction:  Unknown   . Codeine Hives  . Latex Hives  . Metformin Nausea And Vomiting  . Tape Rash    Family History: Family History  Problem Relation Age of Onset  . Kidney failure Mother   . Diabetes Mother   . Aortic aneurysm Father   . Coronary artery disease Father   . Heart attack Father   . Diabetes Sister   . Diabetes Brother   . Kidney failure Brother     Social History:  reports that she has never smoked. She has never used smokeless tobacco. She reports that she does not drink alcohol or use drugs.  ROS: Pertinent ROS in HPI  Physical Exam: BP 117/65   Pulse 64   Ht 5\' 4"  (1.626 m)   LMP 02/01/1986 (Approximate)   BMI 42.91 kg/m   Constitutional:  Well nourished. Alert and oriented, No acute distress. HEENT: [place] AT, mask in place.  Trachea midline, no masses. Cardiovascular: No clubbing, cyanosis, or edema. Respiratory: Normal respiratory effort, no increased work of breathing. GI: Abdomen is soft, non tender, non distended, no abdominal masses.  GU: No CVA tenderness.  No bladder fullness or masses.  Atrophic external genitalia, sparse pubic hair distribution, no lesions.  Foley in place, found on tension draining clear yellow urine.  No bladder fullness, tenderness or masses. Pale vagina mucosa, poor estrogen effect, no discharge, no lesions, fair pelvic support, no cystocele and no rectocele noted.   Neurologic: Grossly intact, no  focal deficits, moving all 4 extremities. Psychiatric: Normal mood and affect.   Laboratory Data: Lab Results  Component Value Date   WBC 8.5 10/09/2019   HGB 10.7 (L) 10/09/2019   HCT 33.3 (L) 10/09/2019   MCV 107.4 (H) 10/09/2019   PLT 131 (L) 10/09/2019    Lab Results  Component Value Date   CREATININE 2.57 (H) 10/09/2019    Lab Results  Component Value Date   HGBA1C 5.5 10/17/2018    Lab Results  Component Value Date   TSH 2.388 10/17/2018       Component Value Date/Time   CHOL 113 10/17/2018 0501   CHOL 105 09/12/2014 1353   HDL 52 10/17/2018 0501   HDL 44 09/12/2014 1353   CHOLHDL 2.2 10/17/2018 0501   VLDL 8 10/17/2018 0501   VLDL 19 09/12/2014 1353   LDLCALC 53 10/17/2018 0501   LDLCALC 42 09/12/2014 1353    Lab Results  Component Value Date   AST 21 10/09/2019   Lab Results  Component Value Date   ALT 16 10/09/2019    Urinalysis    Component Value Date/Time   COLORURINE AMBER (A) 10/09/2019 2009   APPEARANCEUR TURBID (A) 10/09/2019 2009   LABSPEC 1.011 10/09/2019 2009   PHURINE 6.0 10/09/2019 2009   GLUCOSEU NEGATIVE 10/09/2019 2009   HGBUR NEGATIVE 10/09/2019 2009   BILIRUBINUR NEGATIVE 10/09/2019 2009   [place] NEGATIVE 10/09/2019 2009   PROTEINUR 100 (A) 10/09/2019 2009   NITRITE NEGATIVE 10/09/2019 2009  LEUKOCYTESUR LARGE (A) 10/09/2019 2009    I have reviewed the labs.   Assessment & Plan:    1. Bladder spasms I have not seen any sediment or blood clots that would cause bladder spasms.  Mrs. Wojtaszek is not having symptoms of a urinary tract infection.  I had a discussion with the daughter regarding moving forward with the options of removing the Foley, having a suprapubic tube placed and having someone straight cath Mrs. Behrle 3-4 times daily.  I explained that if we remove the Foley completely that it would worsen kidney function although this may not be the concern as she is on dialysis, but it would result in high residuals  setting her up for urinary tract infections and with the potential for refluxing back up into her left kidney causing pyelonephritis and sepsis.  I explained that this situation would be life-threatening and would have a high chance of occurring due to her past history of high residuals and now her use of Lasix.  A suprapubic tube could be placed and I explained to the daughter how this is performed, but I cautioned her that there is also risk of bladder spasms with a suprapubic tube although it is a lower risk.  The best option would be to have someone straight cath Mrs. Kalis 3-4 times daily, but her daughter has small children at home and does not have other resources available to her at this time for someone to catheterize her mother.  I also explained that finding the catheter on tension today may also be a cause of the bladder spasms.  I explained to the daughter and the caregiver to check for slack in the line on the Foley catheter on a routine basis and when Mrs. Ruan is having bladder spasms as this may be the cause.  I have also sent a prescription for Levsin 0.125 mg tablets to take as needed when she is having bladder spasms.  If she continues to have bladder spasms tonight even with the Foley catheter having slack in the line and with the use of Levsin, she will present tomorrow for a voiding trial.    2. Urinary retention Currently managed with indwelling Foley      Return for tomorrow for TOV .  These notes generated with voice recognition software. I apologize for typographical errors.  Zara Council, PA-C  [facility] [address]  [place], [place] [address] [phone]

## 2019-11-26 ENCOUNTER — Ambulatory Visit: Payer: Medicare Other | Admitting: Urology

## 2019-11-26 ENCOUNTER — Ambulatory Visit: Payer: Self-pay | Admitting: Physician Assistant

## 2019-11-26 ENCOUNTER — Encounter: Payer: Self-pay | Admitting: Urology

## 2019-11-28 ENCOUNTER — Other Ambulatory Visit: Payer: Self-pay

## 2019-11-28 ENCOUNTER — Ambulatory Visit (INDEPENDENT_AMBULATORY_CARE_PROVIDER_SITE_OTHER): Payer: Medicare Other | Admitting: Podiatry

## 2019-11-28 ENCOUNTER — Encounter: Payer: Self-pay | Admitting: Podiatry

## 2019-11-28 VITALS — Temp 97.1°F

## 2019-11-28 DIAGNOSIS — E1122 Type 2 diabetes mellitus with diabetic chronic kidney disease: Secondary | ICD-10-CM

## 2019-11-28 DIAGNOSIS — M79675 Pain in left toe(s): Secondary | ICD-10-CM | POA: Diagnosis not present

## 2019-11-28 DIAGNOSIS — B351 Tinea unguium: Secondary | ICD-10-CM | POA: Diagnosis not present

## 2019-11-28 DIAGNOSIS — E1169 Type 2 diabetes mellitus with other specified complication: Secondary | ICD-10-CM | POA: Diagnosis not present

## 2019-11-28 DIAGNOSIS — M79674 Pain in right toe(s): Secondary | ICD-10-CM | POA: Insufficient documentation

## 2019-11-28 NOTE — Progress Notes (Signed)
This patient returns to my office for at risk foot care.  This patient requires this care by a professional since this patient will be at risk due to having ESRD, and diabetes.    This patient is unable to cut nails herself since the patient cannot reach her nails.These nails are painful  wearing shoes. Patient presents to the office in a wheelchair and with aide. This patient presents for at risk foot care today.  General Appearance  Alert, conversant and in no acute stress.  Vascular  Dorsalis pedis and posterior tibial  pulses are weakly  palpable  bilaterally.  Capillary return is within normal limits  bilaterally. Temperature is within normal limits  bilaterally.  Neurologic  Senn-Weinstein monofilament wire test absent  bilaterally. Muscle power within normal limits bilaterally.  Nails Thick disfigured discolored nails with subungual debris  from hallux to fifth toes bilaterally. No evidence of bacterial infection or drainage bilaterally.  Orthopedic  No limitations of motion  feet .  No crepitus or effusions noted.  No bony pathology or digital deformities noted. HAV  B/L.  Hammer toe second right.  Skin  normotropic skin with no porokeratosis noted bilaterally.  No signs of infections or ulcers noted.     Onychomycosis  Pain in right toes  Pain in left toes  Consent was obtained for treatment procedures.   Mechanical debridement of nails 1-5  bilaterally performed with a nail nipper.  Filed with dremel without incident.    Return office visit    3 months                 Told patient to return for periodic foot care and evaluation due to potential at risk complications.   Gardiner Barefoot DPM

## 2019-12-02 ENCOUNTER — Other Ambulatory Visit: Payer: Self-pay | Admitting: Family Medicine

## 2019-12-02 DIAGNOSIS — Z780 Asymptomatic menopausal state: Secondary | ICD-10-CM

## 2019-12-10 ENCOUNTER — Other Ambulatory Visit: Payer: Self-pay

## 2019-12-10 ENCOUNTER — Other Ambulatory Visit: Payer: Medicare Other | Admitting: Nurse Practitioner

## 2019-12-10 ENCOUNTER — Telehealth: Payer: Self-pay | Admitting: Nurse Practitioner

## 2019-12-10 NOTE — Telephone Encounter (Signed)
I called Deanna Vecchione, Ms. Guiffre daughter for scheduled telemedicine f/u PC visit, no answer, message left with contact information

## 2019-12-17 ENCOUNTER — Ambulatory Visit (INDEPENDENT_AMBULATORY_CARE_PROVIDER_SITE_OTHER): Payer: Medicare Other | Admitting: Physician Assistant

## 2019-12-17 ENCOUNTER — Other Ambulatory Visit: Payer: Self-pay

## 2019-12-17 VITALS — BP 137/70 | HR 73 | Ht 63.0 in | Wt 250.0 lb

## 2019-12-17 DIAGNOSIS — Z91040 Latex allergy status: Secondary | ICD-10-CM

## 2019-12-17 DIAGNOSIS — N3289 Other specified disorders of bladder: Secondary | ICD-10-CM

## 2019-12-17 DIAGNOSIS — T839XXA Unspecified complication of genitourinary prosthetic device, implant and graft, initial encounter: Secondary | ICD-10-CM | POA: Diagnosis not present

## 2019-12-17 DIAGNOSIS — R8290 Unspecified abnormal findings in urine: Secondary | ICD-10-CM

## 2019-12-17 LAB — MICROSCOPIC EXAMINATION

## 2019-12-17 LAB — URINALYSIS, COMPLETE
Bilirubin, UA: NEGATIVE
Glucose, UA: NEGATIVE
Ketones, UA: NEGATIVE
Nitrite, UA: NEGATIVE
Specific Gravity, UA: 1.015 (ref 1.005–1.030)
Urobilinogen, Ur: 1 mg/dL (ref 0.2–1.0)
pH, UA: 7 (ref 5.0–7.5)

## 2019-12-17 MED ORDER — SULFAMETHOXAZOLE-TRIMETHOPRIM 800-160 MG PO TABS: 1.0000 | ORAL_TABLET | Freq: Once | ORAL | 0 refills | Status: AC

## 2019-12-17 MED ORDER — HYOSCYAMINE SULFATE 0.125 MG PO TABS: 0.1250 mg | ORAL_TABLET | ORAL | 5 refills | Status: DC | PRN

## 2019-12-17 MED ORDER — SULFAMETHOXAZOLE-TRIMETHOPRIM 400-80 MG PO TABS: 1.0000 | ORAL_TABLET | Freq: Every day | ORAL | 0 refills | Status: AC

## 2019-12-17 NOTE — Progress Notes (Signed)
12/17/2019 3:11 PM   Dawn Bradford 02-06-1936 [id number]  CC: Leaking Foley catheter  HPI: Dawn Bradford is a 84 y.o. female with a recent history of recurrent urinary retention managed with chronic indwelling Foley who presents today for evaluation of catheter problems.   Patient is accompanied today by her caregiver, who contributes to HPI.  She reports Dawn Bradford has experienced some leakage from her leg bag.  Caregiver returned to her shift this morning to note that the bag was folded in half and bunched up at the level of her knee.  She wonders if this was contributory.  Additionally, she reports patient has had some bothersome malodorous urine.  She reports Dawn Bradford complained of low back pain yesterday and she agrees the patient has had some altered mental status.  Lastly, she reports that the [name] prescribed at her last visit has helped tremendously with the patient's painful bladder spasms and wishes for refill today.  In-office unspun UA today positive for 1+ blood, 2+ protein, and 2+ leukocyte esterase; urine microscopy with 11-30 WBCs/HPF.  PMH: Past Medical History:  Diagnosis Date  . ACE-inhibitor cough   . Aortic valve disorder    Echo in 2009 showed mean aortic valve gradient of 13 mmHg, suggesting very mild stenosis. Echo (12/10) suggested aortic sclerosis only  . Carotid artery disease (HCC)    mild, carotid dopplers 12/2009  . CHF (congestive heart failure) ([place])   . COPD (chronic obstructive pulmonary disease) ([place])   . Coronary artery disease    s/p anterior MI in 1996 followed by CABG. Lexiscan myoview (4/11): EF 67%, normal perfusion with no evidence for ischemia or infarction.   . Diabetes mellitus    type 2  . Diastolic heart failure    most recent echo (12/10) showed EF 55-60% with mild LVH, grade I diastolic dysfunction, mild LAE.  [name] ESRD (end stage renal disease) on dialysis ([place])   . Hyperlipidemia   . Hypertension    resistant  hyptertension times many years. the patient does have renal artery stenosis. she has tried calcium channel blockers in the past and states that she would not take them now because she had some problems with her gums which her dentist identified as calcium-channel blocker side effects.  . Hypothyroidism   . Mild asthma    PFT 02/05/10 FEV1 1.38, FEV1% 73, TLC 3.78 (86%), DLCO 48%, +BD  . Myocardial infarction ([place])   . Obesity   . Obesity hypoventilation syndrome ([place])       . Obstructive sleep apnea    PSG 01/05/2006 AHI 24.8, CPAP 9cm H2O  . Pulmonary nodule, right   . Renal artery stenosis [facility])    The patient has an occluded right renal artery and an atrophic right kidney. There is 20% left renal artery stenosis. This was seen by catheterization in 2007  . Stroke [facility])     Surgical History: Past Surgical History:  Procedure Laterality Date  . A/V FISTULAGRAM N/A 01/25/2018   Procedure: A/V FISTULAGRAM;  Surgeon: Algernon Huxley, MD;  Location: [place] [facility];  Service: Cardiovascular;  Laterality: N/A;  . A/V FISTULAGRAM Left 02/21/2019   Procedure: A/V FISTULAGRAM;  Surgeon: Algernon Huxley, MD;  Location: [place] [facility];  Service: Cardiovascular;  Laterality: Left;  . A/V FISTULAGRAM Left 10/24/2019   Procedure: A/V FISTULAGRAM;  Surgeon: Algernon Huxley, MD;  Location: [place] [facility];  Service: Cardiovascular;  Laterality: Left;  . ABDOMINAL  HYSTERECTOMY  1985  . AV FISTULA PLACEMENT    . CATARACT EXTRACTION  2010  . CORONARY ARTERY BYPASS GRAFT  1996  . DIALYSIS FISTULA CREATION    . [place]   right  . INTRAMEDULLARY (IM) NAIL INTERTROCHANTERIC Right 08/17/2018   Procedure: INTRAMEDULLARY (IM) NAIL INTERTROCHANTRIC;  Surgeon: Earnestine Leys, MD;  Location: [facility];  Service: Orthopedics;  Laterality: Right;  . PERIPHERAL VASCULAR BALLOON ANGIOPLASTY  02/21/2019   Procedure: PERIPHERAL VASCULAR BALLOON ANGIOPLASTY;  Surgeon: Algernon Huxley, MD;  Location: [place] [facility];  Service: Cardiovascular;;  . PERIPHERAL VASCULAR CATHETERIZATION Left 01/06/2015   Procedure: A/V Shuntogram/Fistulagram;  Surgeon: Katha Cabal, MD;  Location: [place] [facility];  Service: Cardiovascular;  Laterality: Left;  . PERIPHERAL VASCULAR CATHETERIZATION N/A 02/17/2015   Procedure: Dialysis/Perma Catheter Removal;  Surgeon: Katha Cabal, MD;  Location: [place] [facility];  Service: Cardiovascular;  Laterality: N/A;  . PERIPHERAL VASCULAR CATHETERIZATION N/A 01/28/2016   Procedure: A/V Shuntogram/Fistulagram;  Surgeon: Algernon Huxley, MD;  Location: [place] [facility];  Service: Cardiovascular;  Laterality: N/A;  . PERIPHERAL VASCULAR CATHETERIZATION N/A 01/28/2016   Procedure: A/V Shunt Intervention;  Surgeon: Algernon Huxley, MD;  Location: [place] [facility];  Service: Cardiovascular;  Laterality: N/A;  . TUBAL LIGATION  1973    Home Medications:  Allergies as of 12/17/2019      Reactions   Ace Inhibitors Other (See Comments)   Reaction:  Unknown    Amlodipine Other (See Comments)   Reaction:  Unknown    Clonidine Hydrochloride Other (See Comments)   Reaction:  Unknown    Codeine Hives   Latex Hives   Metformin Nausea And Vomiting   Tape Rash      Medication List       Accurate as of December 17, 2019  3:11 PM. If you have any questions, ask your nurse or doctor.        acetaminophen 325 MG tablet Commonly known as: TYLENOL Take 2 tablets (650 mg total) by mouth every 6 (six) hours as needed for mild pain, fever or headache (or Fever >/= 101).   albuterol 108 (90 Base) MCG/ACT inhaler Commonly known as: VENTOLIN HFA Inhale 2 puffs into the lungs every 6 (six) hours as needed for wheezing or shortness of breath.   b complex-vitamin c-folic acid 0.8 MG Tabs tablet Take 1 tablet by mouth daily.   carvedilol 25 MG tablet Commonly known as: COREG Take 25 mg by mouth 2 (two) times daily.   DERMACLOUD EX Apply liberal amount topically to area  of skin irritation as needed.  OK to leave at bedside   feeding supplement (PRO-STAT SUGAR FREE 64) Liqd Take 30 mLs by mouth 2 (two) times daily.   ferrous fumarate-iron polysaccharide complex 162-115.2 MG Caps capsule Commonly known as: TANDEM Take 1 capsule by mouth daily.   ferrous sulfate 325 (65 FE) MG tablet Take 325 mg by mouth daily with breakfast.   FLUoxetine 40 MG capsule Commonly known as: PROZAC Take 50 mg by mouth daily.   Fluticasone-Salmeterol 100-50 MCG/DOSE Aepb Commonly known as: ADVAIR Inhale 1 puff into the lungs 2 (two) times daily.   furosemide 20 MG tablet Commonly known as: LASIX Take 20 mg by mouth once a week. Give on Thursday   furosemide 80 MG tablet Commonly known as: LASIX Give 1 tablet by mouth daily on Sunday, Tuesday and Saturday   hydrALAZINE 50 MG tablet  Commonly known as: APRESOLINE Take 50 mg by mouth 2 (two) times daily.   hyoscyamine 0.125 MG tablet Commonly known as: Levsin Take 1 tablet (0.125 mg total) by mouth every 4 (four) hours as needed.   ipratropium 0.02 % nebulizer solution Commonly known as: ATROVENT Take 0.5 mg by nebulization every 6 (six) hours as needed for wheezing or shortness of breath.   isosorbide mononitrate 30 MG 24 hr tablet Commonly known as: IMDUR Take 30 mg by mouth at bedtime.   levothyroxine 75 MCG tablet Commonly known as: SYNTHROID Take 75 mcg by mouth daily.   lidocaine-prilocaine cream Commonly known as: EMLA Apply 1 application topically as needed (for pain).   melatonin 5 MG Tabs Take 5 mg by mouth at bedtime.   methocarbamol 500 MG tablet Commonly known as: ROBAXIN Take 500 mg by mouth every 8 (eight) hours as needed for muscle spasms.   midodrine 5 MG tablet Commonly known as: PROAMATINE Take 1 tablet (5 mg total) by mouth 2 (two) times daily with a meal.   NON FORMULARY Diet Type:  Renal, NAS, NCS, 1200 cc fluid restriction   nystatin powder Generic drug: nystatin Apply 1  g topically as needed (for irritation).   omeprazole 10 MG capsule Commonly known as: PRILOSEC Take 10 mg by mouth daily.   ondansetron 4 MG tablet Commonly known as: ZOFRAN Take 4 mg by mouth 2 (two) times daily as needed for nausea or vomiting.   oxybutynin 5 MG tablet Commonly known as: DITROPAN Take 1 tablet (5 mg total) by mouth every 8 (eight) hours as needed for bladder spasms. Do not take more than three tablets daily and not more than every 8 hours for spasms.   OXYGEN Inhale 2 L/min into the lungs continuous.   polyethylene glycol 17 g packet Commonly known as: MIRALAX / GLYCOLAX Take 17 g by mouth daily.   rosuvastatin 40 MG tablet Commonly known as: CRESTOR Take 40 mg by mouth every evening.   Senna Plus 8.6-50 MG Caps Generic drug: Sennosides-Docusate Sodium Take 2 capsules by mouth 2 (two) times daily as needed.   telmisartan 80 MG tablet Commonly known as: MICARDIS Take 80 mg by mouth daily. At night   traMADol 50 MG tablet Commonly known as: ULTRAM Take 50 mg by mouth every 6 (six) hours as needed.   traZODone 100 MG tablet Commonly known as: DESYREL Take 100 mg by mouth at bedtime.   triamcinolone cream 0.5 % Commonly known as: KENALOG Apply 1 application topically as needed (for irritation).   UNABLE TO FIND CPAP @@ Bedtime   Vitamin D3 125 MCG (5000 UT) Tabs Take 5,000 Units by mouth daily.       Allergies:  Allergies  Allergen Reactions  . Ace Inhibitors Other (See Comments)    Reaction:  Unknown   . Amlodipine Other (See Comments)    Reaction:  Unknown   . Clonidine Hydrochloride Other (See Comments)    Reaction:  Unknown   . Codeine Hives  . Latex Hives  . Metformin Nausea And Vomiting  . Tape Rash    Family History: Family History  Problem Relation Age of Onset  . Kidney failure Mother   . Diabetes Mother   . Aortic aneurysm Father   . Coronary artery disease Father   . Heart attack Father   . Diabetes Sister   .  Diabetes Brother   . Kidney failure Brother     Social History:   reports that she  has never smoked. She has never used smokeless tobacco. She reports that she does not drink alcohol or use drugs.  Physical Exam: BP 137/70   Pulse 73   Ht 5\' 3"  (1.6 m)   Wt 250 lb (113.4 kg)   LMP 02/01/1986 (Approximate)   BMI 44.29 kg/m   Constitutional:  Alert, no acute distress, nontoxic appearing HEENT: [place], AT Cardiovascular: No clubbing, cyanosis, or edema Respiratory: Normal respiratory effort, no increased work of breathing GU: Foley catheter in place with leg bag bunched up at the level of her knee.  Intact labia majora and minora without edema, rashes, or lesions Skin: No rashes, bruises or suspicious lesions Neurologic: Requires heavy assist for transfer Psychiatric: Normal mood and affect  Laboratory Data: Results for orders placed or performed in visit on 12/17/19  Microscopic Examination   URINE  Result Value Ref Range   WBC, UA 11-30 (A) 0 - 5 /hpf   RBC 0-2 0 - 2 /hpf   Epithelial Cells (non renal) 0-10 0 - 10 /hpf   Casts Present (A) None seen /lpf   Cast Type Hyaline casts N/A   Bacteria, UA Few None seen/Few  Urinalysis, Complete  Result Value Ref Range   Specific Gravity, UA 1.015 1.005 - 1.030   pH, UA 7.0 5.0 - 7.5   Color, UA Yellow Yellow   Appearance Ur Cloudy (A) Clear   Leukocytes,UA 2+ (A) Negative   Protein,UA 2+ (A) Negative/Trace   Glucose, UA Negative Negative   Ketones, UA Negative Negative   RBC, UA 1+ (A) Negative   Bilirubin, UA Negative Negative   Urobilinogen, Ur 1.0 0.2 - 1.0 mg/dL   Nitrite, UA Negative Negative   Microscopic Examination See below:    Assessment & Plan:   1. Problem with Foley catheter, initial encounter ([place]) Leaking Foley catheter, likely secondary to bladder spasms plus catheter on tension plus bag manipulation.  Last Foley catheter change more than 3 weeks ago.  I elected to exchange her Foley catheter in clinic today.   See separate procedure note for details.  2. Malodorous urine I explained to the patient, her caregiver, and her daughter via telephone that malodorous urine is typical with a chronic indwelling Foley catheter.  I recommend more frequent changing of the patient's leg bag for management of the symptom.  Given her reports of altered mental status, I did obtain a UA from her new Foley catheter for further evaluation of possible UTI today.  Will send for culture for further evaluation.  Starting patient on empiric Bactrim.  Adjusted dosing given hemodialysis.  Counseled patient to take 1 Bactrim DS dose today followed by 1 Bactrim SS dose daily for 9 days and encouraged them to administer doses following treatment completion on hemodialysis days.  They expressed understanding. - Urinalysis, Complete - sulfamethoxazole-trimethoprim (BACTRIM DS) 800-160 MG tablet; Take 1 tablet by mouth once for 1 dose.  Dispense: 1 tablet; Refill: 0 - sulfamethoxazole-trimethoprim (BACTRIM) 400-80 MG tablet; Take 1 tablet by mouth daily for 9 days.  Dispense: 9 tablet; Refill: 0 - CULTURE, URINE COMPREHENSIVE  3. Bladder spasm Refilled Levsin today, caregiver reports this is working well. - hyoscyamine (LEVSIN) 0.125 MG tablet; Take 1 tablet (0.125 mg total) by mouth every 4 (four) hours as needed.  Dispense: 30 tablet; Refill: 5  4. History of allergy to latex No evidence of latex reaction on physical exam today.  I am concerned that switching to a stiffer silicone catheter will cause genital discomfort  in the patient.  Recommend continuing with latex Foley catheters with genital reexam at each monthly exchange.  Can change to a silicone catheter if she develops a reaction to these.  Patient, caregiver, and daughter are in agreement with this plan.  Return in about 4 weeks (around 01/14/2020) for Catheter exchange.   I spent 48 minutes on the day of the encounter to include pre-visit record review, face-to-face time  with the patient, and post-visit ordering of tests.   Debroah Loop, PA-C  [facility] [address], [place], [place] [address] [phone]

## 2019-12-17 NOTE — Progress Notes (Signed)
Cath Change/ Replacement  Patient is present today for a catheter change due to urinary retention.  34ml of water was removed from the balloon, a 16FR foley cath was removed without difficulty.  Patient was cleaned and prepped in a sterile fashion with betadine. A 16 FR foley cath was replaced into the bladder no complications were noted Urine return was noted 24ml and urine was yellow in color. The balloon was filled with 2ml of sterile water. A leg bag was attached for drainage.  Patient was provided with 3 additional leg bags and patient was given instruction on how to change from one bag to another. Patient was given proper instruction on catheter care.    Performed by: Debroah Loop, PA-C

## 2019-12-17 NOTE — Patient Instructions (Addendum)
1. Always maintain slack on the Foley catheter tubing.  Keep the drainage bag secured to your upper thigh to ensure this. 2. Make sure the Foley catheter drainage bag sits flat against your leg so that it drains appropriately. 3. Exchange the drainage bag as desired to reduce odor. 4. Take the single Bactrim DS (800-160mg ) tablet today, then switch to the Bactrim SS (400-80mg ) dose once a day thereafter for a total of 10 days of treatment. On dialysis days, make sure to take the medication after dialysis.  Indwelling Urinary Catheter Care, Adult An indwelling urinary catheter is a thin, flexible, germ-free (sterile) tube that is placed into the bladder to help drain urine out of the body. The catheter is inserted into the part of the body that drains urine from the bladder (urethra). Urine drains from the catheter into a drainage bag outside of the body. Taking good care of your catheter will keep it working properly and help to prevent problems from developing. What are the risks?  Bacteria may get into your bladder and cause a urinary tract infection.  Urine flow can become blocked. This can happen if the catheter is not working correctly, or if you have sediment or a blood clot in your bladder or the catheter.  Tissue near the catheter may become irritated and bleed. How to wear your catheter and your drainage bag Supplies needed  Adhesive tape or a leg strap.  Alcohol wipe or soap and water (if you use tape).  A clean towel (if you use tape).  Overnight drainage bag.  Smaller drainage bag (leg bag). Wearing your catheter and bag Use adhesive tape or a leg strap to attach your catheter to your leg.  Make sure the catheter is not pulled tight.  If a leg strap gets wet, replace it with a dry one.  If you use adhesive tape: 1. Use an alcohol wipe or soap and water to wash off any stickiness on your skin where you had tape before. 2. Use a clean towel to pat-dry the area. 3. Apply  the new tape. You should have received a large overnight drainage bag and a smaller leg bag that fits underneath clothing.  You may wear the overnight bag at any time, but you should not wear the leg bag at night.  Always wear the leg bag below your knee.  Make sure the overnight drainage bag is always lower than the level of your bladder, but do not let it touch the floor. Before you go to sleep, hang the bag inside a wastebasket that is covered by a clean plastic bag. How to care for your skin around the catheter     Supplies needed  A clean washcloth.  Water and mild soap.  A clean towel. Caring for your skin and catheter  Every day, use a clean washcloth and soapy water to clean the skin around your catheter. 1. Wash your hands with soap and water. 2. Wet a washcloth in warm water and mild soap. 3. Clean the skin around your urethra.  If you are female:  Use one hand to gently spread the folds of skin around your vagina (labia).  With the washcloth in your other hand, wipe the inner side of your labia on each side. Do this in a front-to-back direction.  If you are female:  Use one hand to pull back any skin that covers the end of your penis (foreskin).  With the washcloth in your other hand, wipe your  penis in small circles. Start wiping at the tip of your penis, then move outward from the catheter.  Move the foreskin back in place, if this applies. 4. With your free hand, hold the catheter close to where it enters your body. Keep holding the catheter during cleaning so it does not get pulled out. 5. Use your other hand to clean the catheter with the washcloth.  Only wipe downward on the catheter.  Do not wipe upward toward your body, because that may push bacteria into your urethra and cause infection. 6. Use a clean towel to pat-dry the catheter and the skin around it. Make sure to wipe off all soap. 7. Wash your hands with soap and water.  Shower every day. Do not  take baths.  Do not use cream, ointment, or lotion on the area where the catheter enters your body, unless your health care provider tells you to do that.  Do not use powders, sprays, or lotions on your genital area.  Check your skin around the catheter every day for signs of infection. Check for: ? Redness, swelling, or pain. ? Fluid or blood. ? Warmth. ? Pus or a bad smell. How to empty the drainage bag Supplies needed  Rubbing alcohol.  Gauze pad or cotton ball.  Adhesive tape or a leg strap. Emptying the bag Empty your drainage bag (your overnight drainage bag or your leg bag) when it is ?- full, or at least 2-3 times a day. Clean the drainage bag according to the manufacturer's instructions or as told by your health care provider. 1. Wash your hands with soap and water. 2. Detach the drainage bag from your leg. 3. Hold the drainage bag over the toilet or a clean container. Make sure the drainage bag is lower than your hips and bladder. This stops urine from going back into the tubing and into your bladder. 4. Open the pour spout at the bottom of the bag. 5. Empty the urine into the toilet or container. Do not let the pour spout touch any surface. This precaution is important to prevent bacteria from getting in the bag and causing infection. 6. Apply rubbing alcohol to a gauze pad or cotton ball. 7. Use the gauze pad or cotton ball to clean the pour spout. 8. Close the pour spout. 9. Attach the bag to your leg with adhesive tape or a leg strap. 10. Wash your hands with soap and water. How to change the drainage bag Supplies needed:  Alcohol wipes.  A clean drainage bag.  Adhesive tape or a leg strap. Changing the bag Replace your drainage bag with a clean bag if it leaks, starts to smell bad, or looks dirty. 1. Wash your hands with soap and water. 2. Detach the dirty drainage bag from your leg. 3. Pinch the catheter with your fingers so that urine does not spill  out. 4. Disconnect the catheter tube from the drainage tube at the connection valve. Do not let the tubes touch any surface. 5. Clean the end of the catheter tube with an alcohol wipe. Use a different alcohol wipe to clean the end of the drainage tube. 6. Connect the catheter tube to the drainage tube of the clean bag. 7. Attach the clean bag to your leg with adhesive tape or a leg strap. Avoid attaching the new bag too tightly. 8. Wash your hands with soap and water. General instructions   Never pull on your catheter or try to remove it. Pulling can  damage your internal tissues.  Always wash your hands before and after you handle your catheter or drainage bag. Use a mild, fragrance-free soap. If soap and water are not available, use hand sanitizer.  Always make sure there are no twists or bends (kinks) in the catheter tube.  Always make sure there are no leaks in the catheter or drainage bag.  Drink enough fluid to keep your urine pale yellow.  Do not take baths, swim, or use a hot tub.  If you are female, wipe from front to back after having a bowel movement. Contact a health care provider if:  Your urine is cloudy.  Your urine smells unusually bad.  Your catheter gets clogged.  Your catheter starts to leak.  Your bladder feels full. Get help right away if:  You have redness, swelling, or pain where the catheter enters your body.  You have fluid, blood, pus, or a bad smell coming from the area where the catheter enters your body.  The area where the catheter enters your body feels warm to the touch.  You have a fever.  You have pain in your abdomen, legs, lower back, or bladder.  You see blood in the catheter.  Your urine is pink or red.  You have nausea, vomiting, or chills.  Your urine is not draining into the bag.  Your catheter gets pulled out. Summary  An indwelling urinary catheter is a thin, flexible, germ-free (sterile) tube that is placed into the  bladder to help drain urine out of the body.  The catheter is inserted into the part of the body that drains urine from the bladder (urethra).  Take good care of your catheter to keep it working properly and help prevent problems from developing.  Always wash your hands before and after you handle your catheter or drainage bag.  Never pull on your catheter or try to remove it. This information is not intended to replace advice given to you by your health care provider. Make sure you discuss any questions you have with your health care provider. Document Revised: 12/14/2018 Document Reviewed: 04/07/2017 Elsevier Patient Education  [place].

## 2019-12-22 LAB — CULTURE, URINE COMPREHENSIVE

## 2019-12-23 ENCOUNTER — Telehealth: Payer: Self-pay | Admitting: Physician Assistant

## 2019-12-23 NOTE — Telephone Encounter (Signed)
Patient's urine culture has resulted negative.  She does not have a UTI.  Left detailed message for patient's daughter to stop prescribed Bactrim.

## 2019-12-27 ENCOUNTER — Ambulatory Visit: Payer: Self-pay | Admitting: Urology

## 2020-01-16 ENCOUNTER — Ambulatory Visit (INDEPENDENT_AMBULATORY_CARE_PROVIDER_SITE_OTHER): Payer: Medicare Other | Admitting: Physician Assistant

## 2020-01-16 ENCOUNTER — Other Ambulatory Visit: Payer: Self-pay

## 2020-01-16 DIAGNOSIS — R339 Retention of urine, unspecified: Secondary | ICD-10-CM | POA: Diagnosis not present

## 2020-01-16 NOTE — Progress Notes (Signed)
Cath Change/ Replacement  Patient is present today for a catheter change due to urinary retention.  9 ml of water was removed from the balloon, a 16 FR foley cath was removed with out difficulty.  Patient was cleaned and prepped in a sterile fashion with betadine. A 16 FR foley cath was replaced into the bladder no complications were noted Urine return was noted 5 ml and urine was clear in color. The balloon was filled with 64ml of sterile water. A Night bag was attached for drainage.  A night bag was also given to the patient and patient was given instruction on how to change from one bag to another. Patient was given proper instruction on catheter care.    Performed by: Kerman Passey, RMA  Follow [place]:TVNR 15 2021, 1 mo cath exchange

## 2020-01-21 ENCOUNTER — Encounter: Payer: Self-pay | Admitting: Pulmonary Disease

## 2020-01-21 ENCOUNTER — Ambulatory Visit: Payer: Medicare Other | Admitting: Pulmonary Disease

## 2020-01-21 ENCOUNTER — Other Ambulatory Visit: Payer: Self-pay

## 2020-01-21 ENCOUNTER — Ambulatory Visit (INDEPENDENT_AMBULATORY_CARE_PROVIDER_SITE_OTHER): Payer: Medicare Other | Admitting: Pulmonary Disease

## 2020-01-21 VITALS — BP 124/72 | HR 65 | Temp 97.4°F

## 2020-01-21 DIAGNOSIS — G4733 Obstructive sleep apnea (adult) (pediatric): Secondary | ICD-10-CM

## 2020-01-21 NOTE — Patient Instructions (Signed)
Will arrange for home sleep study Will call to arrange for follow up after sleep study reviewed  

## 2020-01-21 NOTE — Progress Notes (Signed)
[facility]  Chief Complaint  Patient presents with  . Follow-up    O2 reauthorization. Patient uses 2L during the day and at night. Denies any SOB or breathing issues.     Constitutional:  BP 124/72   Pulse 65   Temp (!) 97.4 F (36.3 C) (Temporal)   LMP 02/01/1986 (Approximate)   SpO2 95% Comment: on RA  Past Medical History:  HTN, Hypothyroidism, HLD, CAD s/p CABG, DM, Cataract, Depression, CVA, ESRD  Summary:  Dawn Bradford is a 84 y.o. female with OSA/OHS, asthma, diastolic CHF, and deconditioning.  Subjective:   I last saw her in August 2019.  She weight 213 lbs at that time and was using CPAP 8 cm H2O at night.  She was in a nursing home for about 1 year, and recently returned home.  She is here today with her caregiver, and spoke with her daughter over the phone.  At some point she was started on supplemental oxygen at night, but it isn't clear why.  Also not clear if she needs oxygen qualifying test.  She sometime uses CPAP at night, but wasn't when she was in nursing home.   Physical Exam:   Appearance - in wheelchair  ENMT - no sinus tenderness, no nasal discharge, no oral exudate, Mallampati 2  Respiratory - no wheeze, or rales  CV - regular rate and rhythm, no murmurs  GI - soft, non tender  Lymph - no adenopathy noted in neck  Ext - no edema  Skin - no rashes  Psych - normal mood and affect  Assessment/Plan:   Obstructive sleep apnea. - it isn't clear to me what her appropriate set up should be at this time since so much has changed for her since I last saw her in 2019 - will arrange for home sleep study to assess current status, and then figure out if she needs CPAP +/- supplemental oxygen at night  Asthma. - reported history of COPD, but not clear how this diagnosis was made; she didn't smoke cigarettes - don't think she could adequately do pulmonary function test to assess further at this time -  continue advair, prn albuterol  A total of 34 minutes addressing patient care on the day of the visit.  Follow up:  Patient Instructions  Will arrange for home sleep study Will call to arrange for follow up after sleep study reviewed   Signature:  Chesley Mires, MD [place] Pager: [phone] 01/21/2020, 12:42 PM  Flow Sheet    Sleep tests:  PSG 01/05/06>>AHI 24.8  ONO with CPAP and RA 04/05/17 >>test time 10 hrs 54 min. Basal SpO2 93%, low SpO2 85%. Spent 8 min with SpO2 <88%. CPAP 04/01/18 to 04/30/18 >> used on 30 of 30 nights with average 12 hrs 34 min.  Average AHI 3.5 with CPAP 8 cm H2O  Cardiac tests:  Echo 02/01/15 >> EF 55 to 60%, PAS 43 mmHg  Medications:   Allergies as of 01/21/2020      Reactions   Ace Inhibitors Other (See Comments)   Reaction:  Unknown    Amlodipine Other (See Comments)   Reaction:  Unknown    Clonidine Hydrochloride Other (See Comments)   Reaction:  Unknown    Codeine Hives   Latex Hives   Metformin Nausea And Vomiting   Tape Rash      Medication List       Accurate as of Jan 21, 2020 12:42 PM.  If you have any questions, ask your nurse or doctor.        acetaminophen 325 MG tablet Commonly known as: TYLENOL Take 2 tablets (650 mg total) by mouth every 6 (six) hours as needed for mild pain, fever or headache (or Fever >/= 101).   albuterol 108 (90 Base) MCG/ACT inhaler Commonly known as: VENTOLIN HFA Inhale 2 puffs into the lungs every 6 (six) hours as needed for wheezing or shortness of breath.   b complex-vitamin c-folic acid 0.8 MG Tabs tablet Take 1 tablet by mouth daily.   carvedilol 25 MG tablet Commonly known as: COREG Take 25 mg by mouth 2 (two) times daily.   DERMACLOUD EX Apply liberal amount topically to area of skin irritation as needed.  OK to leave at bedside   feeding supplement (PRO-STAT SUGAR FREE 64) Liqd Take 30 mLs by mouth 2 (two) times daily.   ferrous fumarate-iron  polysaccharide complex 162-115.2 MG Caps capsule Commonly known as: TANDEM Take 1 capsule by mouth daily.   ferrous sulfate 325 (65 FE) MG tablet Take 325 mg by mouth daily with breakfast.   FLUoxetine 40 MG capsule Commonly known as: PROZAC Take 50 mg by mouth daily.   Fluticasone-Salmeterol 100-50 MCG/DOSE Aepb Commonly known as: ADVAIR Inhale 1 puff into the lungs 2 (two) times daily.   furosemide 20 MG tablet Commonly known as: LASIX Take 20 mg by mouth once a week. Give on Thursday   furosemide 80 MG tablet Commonly known as: LASIX Give 1 tablet by mouth daily on Sunday, Tuesday and Saturday   hydrALAZINE 50 MG tablet Commonly known as: APRESOLINE Take 50 mg by mouth 2 (two) times daily.   hyoscyamine 0.125 MG tablet Commonly known as: Levsin Take 1 tablet (0.125 mg total) by mouth every 4 (four) hours as needed.   ipratropium 0.02 % nebulizer solution Commonly known as: ATROVENT Take 0.5 mg by nebulization every 6 (six) hours as needed for wheezing or shortness of breath.   isosorbide mononitrate 30 MG 24 hr tablet Commonly known as: IMDUR Take 30 mg by mouth at bedtime.   levothyroxine 75 MCG tablet Commonly known as: SYNTHROID Take 75 mcg by mouth daily.   lidocaine-prilocaine cream Commonly known as: EMLA Apply 1 application topically as needed (for pain).   melatonin 5 MG Tabs Take 5 mg by mouth at bedtime.   methocarbamol 500 MG tablet Commonly known as: ROBAXIN Take 500 mg by mouth every 8 (eight) hours as needed for muscle spasms.   midodrine 5 MG tablet Commonly known as: PROAMATINE Take 1 tablet (5 mg total) by mouth 2 (two) times daily with a meal.   NON FORMULARY Diet Type:  Renal, NAS, NCS, 1200 cc fluid restriction   nystatin powder Generic drug: nystatin Apply 1 g topically as needed (for irritation).   omeprazole 10 MG capsule Commonly known as: PRILOSEC Take 10 mg by mouth daily.   ondansetron 4 MG tablet Commonly known as:  ZOFRAN Take 4 mg by mouth 2 (two) times daily as needed for nausea or vomiting.   oxybutynin 5 MG tablet Commonly known as: DITROPAN Take 1 tablet (5 mg total) by mouth every 8 (eight) hours as needed for bladder spasms. Do not take more than three tablets daily and not more than every 8 hours for spasms.   OXYGEN Inhale 2 L/min into the lungs continuous.   polyethylene glycol 17 g packet Commonly known as: MIRALAX / GLYCOLAX Take 17 g by mouth daily.  rosuvastatin 40 MG tablet Commonly known as: CRESTOR Take 40 mg by mouth every evening.   Senna Plus 8.6-50 MG Caps Generic drug: Sennosides-Docusate Sodium Take 2 capsules by mouth 2 (two) times daily as needed.   telmisartan 80 MG tablet Commonly known as: MICARDIS Take 80 mg by mouth daily. At night   traMADol 50 MG tablet Commonly known as: ULTRAM Take 50 mg by mouth every 6 (six) hours as needed.   traZODone 100 MG tablet Commonly known as: DESYREL Take 100 mg by mouth at bedtime.   triamcinolone cream 0.5 % Commonly known as: KENALOG Apply 1 application topically as needed (for irritation).   UNABLE TO FIND CPAP @@ Bedtime   Vitamin D3 125 MCG (5000 UT) Tabs Take 5,000 Units by mouth daily.       Past Surgical History:  She  has a past surgical history that includes Tubal ligation (1973); Inner ear surgery (1979); Abdominal hysterectomy (1985); Coronary artery bypass graft (1996); Cataract extraction (2010); AV fistula placement; Dialysis fistula creation; Cardiac catheterization (Left, 01/06/2015); Cardiac catheterization (N/A, 02/17/2015); Cardiac catheterization (N/A, 01/28/2016); Cardiac catheterization (N/A, 01/28/2016); A/V Fistulagram (N/A, 01/25/2018); Intramedullary (im) nail intertrochanteric (Right, 08/17/2018); A/V Fistulagram (Left, 02/21/2019); PERIPHERAL VASCULAR BALLOON ANGIOPLASTY (02/21/2019); and A/V Fistulagram (Left, 10/24/2019).  Family History:  Her family history includes Aortic aneurysm in her  father; Coronary artery disease in her father; Diabetes in her brother, mother, and sister; Heart attack in her father; Kidney failure in her brother and mother.  Social History:  She  reports that she has never smoked. She has never used smokeless tobacco. She reports that she does not drink alcohol or use drugs.

## 2020-02-17 ENCOUNTER — Ambulatory Visit: Payer: Medicare Other | Admitting: Physician Assistant

## 2020-02-18 ENCOUNTER — Ambulatory Visit (INDEPENDENT_AMBULATORY_CARE_PROVIDER_SITE_OTHER): Payer: Medicare Other | Admitting: Physician Assistant

## 2020-02-18 ENCOUNTER — Other Ambulatory Visit: Payer: Self-pay

## 2020-02-18 DIAGNOSIS — R339 Retention of urine, unspecified: Secondary | ICD-10-CM

## 2020-02-18 NOTE — Patient Instructions (Signed)
Indwelling Urinary Catheter Care, Adult An indwelling urinary catheter is a thin, flexible, germ-free (sterile) tube that is placed into the bladder to help drain urine out of the body. The catheter is inserted into the part of the body that drains urine from the bladder (urethra). Urine drains from the catheter into a drainage bag outside of the body. Taking good care of your catheter will keep it working properly and help to prevent problems from developing. What are the risks?  Bacteria may get into your bladder and cause a urinary tract infection.  Urine flow can become blocked. This can happen if the catheter is not working correctly, or if you have sediment or a blood clot in your bladder or the catheter.  Tissue near the catheter may become irritated and bleed. How to wear your catheter and your drainage bag Supplies needed  Adhesive tape or a leg strap.  Alcohol wipe or soap and water (if you use tape).  A clean towel (if you use tape).  Overnight drainage bag.  Smaller drainage bag (leg bag). Wearing your catheter and bag Use adhesive tape or a leg strap to attach your catheter to your leg.  Make sure the catheter is not pulled tight.  If a leg strap gets wet, replace it with a dry one.  If you use adhesive tape: 1. Use an alcohol wipe or soap and water to wash off any stickiness on your skin where you had tape before. 2. Use a clean towel to pat-dry the area. 3. Apply the new tape. You should have received a large overnight drainage bag and a smaller leg bag that fits underneath clothing.  You may wear the overnight bag at any time, but you should not wear the leg bag at night.  Always wear the leg bag below your knee.  Make sure the overnight drainage bag is always lower than the level of your bladder, but do not let it touch the floor. Before you go to sleep, hang the bag inside a wastebasket that is covered by a clean plastic bag. How to care for your skin around  the catheter     Supplies needed  A clean washcloth.  Water and mild soap.  A clean towel. Caring for your skin and catheter  Every day, use a clean washcloth and soapy water to clean the skin around your catheter. 1. Wash your hands with soap and water. 2. Wet a washcloth in warm water and mild soap. 3. Clean the skin around your urethra.  If you are female:  Use one hand to gently spread the folds of skin around your vagina (labia).  With the washcloth in your other hand, wipe the inner side of your labia on each side. Do this in a front-to-back direction.  If you are female:  Use one hand to pull back any skin that covers the end of your penis (foreskin).  With the washcloth in your other hand, wipe your penis in small circles. Start wiping at the tip of your penis, then move outward from the catheter.  Move the foreskin back in place, if this applies. 4. With your free hand, hold the catheter close to where it enters your body. Keep holding the catheter during cleaning so it does not get pulled out. 5. Use your other hand to clean the catheter with the washcloth.  Only wipe downward on the catheter.  Do not wipe upward toward your body, because that may push bacteria into your urethra   and cause infection. 6. Use a clean towel to pat-dry the catheter and the skin around it. Make sure to wipe off all soap. 7. Wash your hands with soap and water.  Shower every day. Do not take baths.  Do not use cream, ointment, or lotion on the area where the catheter enters your body, unless your health care provider tells you to do that.  Do not use powders, sprays, or lotions on your genital area.  Check your skin around the catheter every day for signs of infection. Check for: ? Redness, swelling, or pain. ? Fluid or blood. ? Warmth. ? Pus or a bad smell. How to empty the drainage bag Supplies needed  Rubbing alcohol.  Gauze pad or cotton ball.  Adhesive tape or a leg  strap. Emptying the bag Empty your drainage bag (your overnight drainage bag or your leg bag) when it is ?- full, or at least 2-3 times a day. Clean the drainage bag according to the manufacturer's instructions or as told by your health care provider. 1. Wash your hands with soap and water. 2. Detach the drainage bag from your leg. 3. Hold the drainage bag over the toilet or a clean container. Make sure the drainage bag is lower than your hips and bladder. This stops urine from going back into the tubing and into your bladder. 4. Open the pour spout at the bottom of the bag. 5. Empty the urine into the toilet or container. Do not let the pour spout touch any surface. This precaution is important to prevent bacteria from getting in the bag and causing infection. 6. Apply rubbing alcohol to a gauze pad or cotton ball. 7. Use the gauze pad or cotton ball to clean the pour spout. 8. Close the pour spout. 9. Attach the bag to your leg with adhesive tape or a leg strap. 10. Wash your hands with soap and water. How to change the drainage bag Supplies needed:  Alcohol wipes.  A clean drainage bag.  Adhesive tape or a leg strap. Changing the bag Replace your drainage bag with a clean bag if it leaks, starts to smell bad, or looks dirty. 1. Wash your hands with soap and water. 2. Detach the dirty drainage bag from your leg. 3. Pinch the catheter with your fingers so that urine does not spill out. 4. Disconnect the catheter tube from the drainage tube at the connection valve. Do not let the tubes touch any surface. 5. Clean the end of the catheter tube with an alcohol wipe. Use a different alcohol wipe to clean the end of the drainage tube. 6. Connect the catheter tube to the drainage tube of the clean bag. 7. Attach the clean bag to your leg with adhesive tape or a leg strap. Avoid attaching the new bag too tightly. 8. Wash your hands with soap and water. General instructions   Never pull on  your catheter or try to remove it. Pulling can damage your internal tissues.  Always wash your hands before and after you handle your catheter or drainage bag. Use a mild, fragrance-free soap. If soap and water are not available, use hand sanitizer.  Always make sure there are no twists or bends (kinks) in the catheter tube.  Always make sure there are no leaks in the catheter or drainage bag.  Drink enough fluid to keep your urine pale yellow.  Do not take baths, swim, or use a hot tub.  If you are female, wipe from   front to back after having a bowel movement. Contact a health care provider if:  Your urine is cloudy.  Your urine smells unusually bad.  Your catheter gets clogged.  Your catheter starts to leak.  Your bladder feels full. Get help right away if:  You have redness, swelling, or pain where the catheter enters your body.  You have fluid, blood, pus, or a bad smell coming from the area where the catheter enters your body.  The area where the catheter enters your body feels warm to the touch.  You have a fever.  You have pain in your abdomen, legs, lower back, or bladder.  You see blood in the catheter.  Your urine is pink or red.  You have nausea, vomiting, or chills.  Your urine is not draining into the bag.  Your catheter gets pulled out. Summary  An indwelling urinary catheter is a thin, flexible, germ-free (sterile) tube that is placed into the bladder to help drain urine out of the body.  The catheter is inserted into the part of the body that drains urine from the bladder (urethra).  Take good care of your catheter to keep it working properly and help prevent problems from developing.  Always wash your hands before and after you handle your catheter or drainage bag.  Never pull on your catheter or try to remove it. This information is not intended to replace advice given to you by your health care provider. Make sure you discuss any questions  you have with your health care provider. Document Revised: 12/14/2018 Document Reviewed: 04/07/2017 Elsevier Patient Education  2020 [employer]  

## 2020-02-18 NOTE — Progress Notes (Signed)
Cath Change/ Replacement  Patient is present today for a catheter change due to urinary retention.  109ml of water was removed from the balloon, a 16FR foley cath was removed without difficulty.  Patient was cleaned and prepped in a sterile fashion with betadine. A 16 FR foley cath was replaced into the bladder no complications were noted Urine return was noted 92ml and urine was yellow in color. The balloon was filled with 67ml of sterile water. A night bag was attached for drainage and secured to the patient's right thigh with a leg strap.  Patient declined additional supplies. Patient was given proper instruction on catheter care.    Performed by: Debroah Loop, PA-C   Additional notes: Patient reports lower abdominal pain and caregiver reports AMS. Denies fever, chills, nausea, vomiting; chronic back pain is unchanged. Unclear if pain represents UTI vs. Catheter discomfort; unclear if AMS represents UTI vs. Uremia. Patient appears at cognitive baseline in clinic today. Obtained urine sample from new catheter; will send for culture and treat as indicated for possible UTI.  Follow up: Return in about 4 weeks (around 03/17/2020) for Catheter exchange.

## 2020-02-21 ENCOUNTER — Telehealth: Payer: Self-pay | Admitting: Physician Assistant

## 2020-02-21 LAB — CULTURE, URINE COMPREHENSIVE

## 2020-02-21 NOTE — Telephone Encounter (Signed)
I just spoke with the patient's daughter, [name], via telephone.  I explained that her mother's urine culture has come back negative for infection.  She expressed understanding.  Additionally, I explained that I suspect some of her mother's discomfort due to the location of the urethral Foley itself.  I explained that a suprapubic catheter may be a better alternative for her to increase her comfort.  We discussed that this is a catheter that would be placed by Interventional Radiology and exchanged with them for the first several months to allow for upsizing and tract maturation. Deanna will consider this option and let us know if and when she is interested in pursuing this option for her mother.

## 2020-03-05 ENCOUNTER — Encounter: Payer: Self-pay | Admitting: Podiatry

## 2020-03-05 ENCOUNTER — Other Ambulatory Visit: Payer: Self-pay

## 2020-03-05 ENCOUNTER — Ambulatory Visit (INDEPENDENT_AMBULATORY_CARE_PROVIDER_SITE_OTHER): Payer: Medicare Other | Admitting: Podiatry

## 2020-03-05 DIAGNOSIS — E669 Obesity, unspecified: Secondary | ICD-10-CM

## 2020-03-05 DIAGNOSIS — E1122 Type 2 diabetes mellitus with diabetic chronic kidney disease: Secondary | ICD-10-CM

## 2020-03-05 DIAGNOSIS — B351 Tinea unguium: Secondary | ICD-10-CM | POA: Diagnosis not present

## 2020-03-05 DIAGNOSIS — E1169 Type 2 diabetes mellitus with other specified complication: Secondary | ICD-10-CM | POA: Diagnosis not present

## 2020-03-05 DIAGNOSIS — N186 End stage renal disease: Secondary | ICD-10-CM

## 2020-03-05 DIAGNOSIS — Z992 Dependence on renal dialysis: Secondary | ICD-10-CM

## 2020-03-05 DIAGNOSIS — M79675 Pain in left toe(s): Secondary | ICD-10-CM | POA: Diagnosis not present

## 2020-03-05 DIAGNOSIS — M79674 Pain in right toe(s): Secondary | ICD-10-CM

## 2020-03-05 NOTE — Progress Notes (Signed)
This patient returns to my office for at risk foot care.  This patient requires this care by a professional since this patient will be at risk due to having ESRD, and diabetes.    This patient is unable to cut nails herself since the patient cannot reach her nails.These nails are painful  wearing shoes. Patient presents to the office in a wheelchair and with aide. This patient presents for at risk foot care today. Patient presents to the office in a wheelchair with a caregiver.  General Appearance  Alert, conversant and in no acute stress.  Vascular  Dorsalis pedis and posterior tibial  pulses are weakly  palpable  bilaterally.  Capillary return is within normal limits  bilaterally. Temperature is within normal limits  bilaterally.  Neurologic  Senn-Weinstein monofilament wire test absent/diminished   bilaterally. Muscle power within normal limits bilaterally.  Nails Thick disfigured discolored nails with subungual debris  from hallux to fifth toes bilaterally. No evidence of bacterial infection or drainage bilaterally.  Orthopedic  No limitations of motion  feet .  No crepitus or effusions noted.  No bony pathology or digital deformities noted. HAV  B/L.  Hammer toe second right.  Skin  normotropic skin with no porokeratosis noted bilaterally.  No signs of infections or ulcers noted.     Onychomycosis  Pain in right toes  Pain in left toes  Consent was obtained for treatment procedures.   Mechanical debridement of nails 1-5  bilaterally performed with a nail nipper.  Filed with dremel without incident.    Return office visit    4 months                 Told patient to return for periodic foot care and evaluation due to potential at risk complications.   Gardiner Barefoot DPM

## 2020-03-12 ENCOUNTER — Other Ambulatory Visit: Payer: Self-pay

## 2020-03-12 ENCOUNTER — Ambulatory Visit (INDEPENDENT_AMBULATORY_CARE_PROVIDER_SITE_OTHER): Payer: Medicare Other | Admitting: Physician Assistant

## 2020-03-12 VITALS — BP 158/81 | HR 73

## 2020-03-12 DIAGNOSIS — N3289 Other specified disorders of bladder: Secondary | ICD-10-CM

## 2020-03-12 DIAGNOSIS — T839XXD Unspecified complication of genitourinary prosthetic device, implant and graft, subsequent encounter: Secondary | ICD-10-CM

## 2020-03-12 NOTE — Progress Notes (Signed)
03/12/2020 3:39 PM   Dawn Bradford 04-Feb-1936 [id number]  CC: Chief Complaint  Patient presents with  . Urinary Retention    HPI: Dawn Bradford is a 84 y.o. female with ESRD on dialysis and urinary retention managed with chronic indwelling Foley who presents as a walk-in to clinic today with reports of pain associated with her catheter.  She is accompanied today by her caregiver, who contributes to HPI.  Today, patient has reported pain at the catheter insertion site with minimal response to anticholinergic medications.  She has a history of the same and reports symptomatic relief with Foley exchange.  I have previously recommended to her daughter to switch to a suprapubic catheter, as I suspect some of her discomfort is associated with sitting on her tubing, however they have declined this to date.    Her last catheter exchange was 23 days ago. She takes Levsin 0.125 mg every 4 hours as needed for management of bladder spasms.  PMH: Past Medical History:  Diagnosis Date  . ACE-inhibitor cough   . Aortic valve disorder    Echo in 2009 showed mean aortic valve gradient of 13 mmHg, suggesting very mild stenosis. Echo (12/10) suggested aortic sclerosis only  . Carotid artery disease (HCC)    mild, carotid dopplers 12/2009  . CHF (congestive heart failure) ([place])   . COPD (chronic obstructive pulmonary disease) ([place])   . Coronary artery disease    s/p anterior MI in 1996 followed by CABG. Lexiscan myoview (4/11): EF 67%, normal perfusion with no evidence for ischemia or infarction.   . Diabetes mellitus    type 2  . Diastolic heart failure    most recent echo (12/10) showed EF 55-60% with mild LVH, grade I diastolic dysfunction, mild LAE.  [name] ESRD (end stage renal disease) on dialysis ([place])   . Hyperlipidemia   . Hypertension    resistant hyptertension times many years. the patient does have renal artery stenosis. she has tried calcium channel blockers in the past and states that  she would not take them now because she had some problems with her gums which her dentist identified as calcium-channel blocker side effects.  . Hypothyroidism   . Mild asthma    PFT 02/05/10 FEV1 1.38, FEV1% 73, TLC 3.78 (86%), DLCO 48%, +BD  . Myocardial infarction ([place])   . Obesity   . Obesity hypoventilation syndrome ([place])       . Obstructive sleep apnea    PSG 01/05/2006 AHI 24.8, CPAP 9cm H2O  . Pulmonary nodule, right   . Renal artery stenosis [facility])    The patient has an occluded right renal artery and an atrophic right kidney. There is 20% left renal artery stenosis. This was seen by catheterization in 2007  . Stroke [facility])     Surgical History: Past Surgical History:  Procedure Laterality Date  . A/V FISTULAGRAM N/A 01/25/2018   Procedure: A/V FISTULAGRAM;  Surgeon: Algernon Huxley, MD;  Location: [place] [facility];  Service: Cardiovascular;  Laterality: N/A;  . A/V FISTULAGRAM Left 02/21/2019   Procedure: A/V FISTULAGRAM;  Surgeon: Algernon Huxley, MD;  Location: [place] [facility];  Service: Cardiovascular;  Laterality: Left;  . A/V FISTULAGRAM Left 10/24/2019   Procedure: A/V FISTULAGRAM;  Surgeon: Algernon Huxley, MD;  Location: [place] [facility];  Service: Cardiovascular;  Laterality: Left;  . ABDOMINAL HYSTERECTOMY  1985  . AV FISTULA PLACEMENT    . CATARACT EXTRACTION  2010  . CORONARY  ARTERY BYPASS GRAFT  1996  . DIALYSIS FISTULA CREATION    . [place]   right  . INTRAMEDULLARY (IM) NAIL INTERTROCHANTERIC Right 08/17/2018   Procedure: INTRAMEDULLARY (IM) NAIL INTERTROCHANTRIC;  Surgeon: Earnestine Leys, MD;  Location: [facility];  Service: Orthopedics;  Laterality: Right;  . PERIPHERAL VASCULAR BALLOON ANGIOPLASTY  02/21/2019   Procedure: PERIPHERAL VASCULAR BALLOON ANGIOPLASTY;  Surgeon: Algernon Huxley, MD;  Location: [place] [facility];  Service: Cardiovascular;;  . PERIPHERAL VASCULAR CATHETERIZATION Left 01/06/2015   Procedure: A/V Shuntogram/Fistulagram;   Surgeon: Katha Cabal, MD;  Location: [place] [facility];  Service: Cardiovascular;  Laterality: Left;  . PERIPHERAL VASCULAR CATHETERIZATION N/A 02/17/2015   Procedure: Dialysis/Perma Catheter Removal;  Surgeon: Katha Cabal, MD;  Location: [place] [facility];  Service: Cardiovascular;  Laterality: N/A;  . PERIPHERAL VASCULAR CATHETERIZATION N/A 01/28/2016   Procedure: A/V Shuntogram/Fistulagram;  Surgeon: Algernon Huxley, MD;  Location: [place] [facility];  Service: Cardiovascular;  Laterality: N/A;  . PERIPHERAL VASCULAR CATHETERIZATION N/A 01/28/2016   Procedure: A/V Shunt Intervention;  Surgeon: Algernon Huxley, MD;  Location: [place] [facility];  Service: Cardiovascular;  Laterality: N/A;  . TUBAL LIGATION  1973    Home Medications:  Allergies as of 03/12/2020      Reactions   Ace Inhibitors Other (See Comments)   Reaction:  Unknown    Amlodipine Other (See Comments)   Reaction:  Unknown    Clonidine Hydrochloride Other (See Comments)   Reaction:  Unknown    Codeine Hives   Latex Hives   Metformin Nausea And Vomiting   Tape Rash      Medication List       Accurate as of March 12, 2020  3:39 PM. If you have any questions, ask your nurse or doctor.        STOP taking these medications   oxybutynin 5 MG tablet Commonly known as: DITROPAN Stopped by: Debroah Loop, PA-C     TAKE these medications   acetaminophen 325 MG tablet Commonly known as: TYLENOL Take 2 tablets (650 mg total) by mouth every 6 (six) hours as needed for mild pain, fever or headache (or Fever >/= 101).   albuterol 108 (90 Base) MCG/ACT inhaler Commonly known as: VENTOLIN HFA Inhale 2 puffs into the lungs every 6 (six) hours as needed for wheezing or shortness of breath.   b complex-vitamin c-folic acid 0.8 MG Tabs tablet Take 1 tablet by mouth daily.   carvedilol 25 MG tablet Commonly known as: COREG Take 25 mg by mouth 2 (two) times daily.   DERMACLOUD EX Apply liberal amount  topically to area of skin irritation as needed.  OK to leave at bedside   feeding supplement (PRO-STAT SUGAR FREE 64) Liqd Take 30 mLs by mouth 2 (two) times daily.   ferrous fumarate-iron polysaccharide complex 162-115.2 MG Caps capsule Commonly known as: TANDEM Take 1 capsule by mouth daily.   ferrous sulfate 325 (65 FE) MG tablet Take 325 mg by mouth daily with breakfast.   FLUoxetine 40 MG capsule Commonly known as: PROZAC Take 50 mg by mouth daily.   FLUoxetine 20 MG capsule Commonly known as: PROZAC Take 40 mg by mouth daily.   Fluoxetine HCl (PMDD) 20 MG Tabs fluoxetine 20 mg tablet   Fluticasone-Salmeterol 100-50 MCG/DOSE Aepb Commonly known as: ADVAIR Inhale 1 puff into the lungs 2 (two) times daily.   furosemide 20 MG tablet Commonly known as: LASIX Take  20 mg by mouth once a week. Give on Thursday   furosemide 80 MG tablet Commonly known as: LASIX Give 1 tablet by mouth daily on Sunday, Tuesday and Saturday   hydrALAZINE 50 MG tablet Commonly known as: APRESOLINE Take 50 mg by mouth 2 (two) times daily.   hyoscyamine 0.125 MG tablet Commonly known as: Levsin Take 1 tablet (0.125 mg total) by mouth every 4 (four) hours as needed.   ipratropium 0.02 % nebulizer solution Commonly known as: ATROVENT Take 0.5 mg by nebulization every 6 (six) hours as needed for wheezing or shortness of breath.   isosorbide mononitrate 30 MG 24 hr tablet Commonly known as: IMDUR Take 30 mg by mouth at bedtime.   levothyroxine 75 MCG tablet Commonly known as: SYNTHROID Take 75 mcg by mouth daily.   lidocaine-prilocaine cream Commonly known as: EMLA Apply 1 application topically as needed (for pain).   melatonin 5 MG Tabs Take 5 mg by mouth at bedtime.   methocarbamol 500 MG tablet Commonly known as: ROBAXIN Take 500 mg by mouth every 8 (eight) hours as needed for muscle spasms.   midodrine 5 MG tablet Commonly known as: PROAMATINE Take 1 tablet (5 mg total) by  mouth 2 (two) times daily with a meal.   NON FORMULARY Diet Type:  Renal, NAS, NCS, 1200 cc fluid restriction   nystatin powder Generic drug: nystatin Apply 1 g topically as needed (for irritation).   omeprazole 10 MG capsule Commonly known as: PRILOSEC Take 10 mg by mouth daily.   ondansetron 4 MG disintegrating tablet Commonly known as: ZOFRAN-ODT Take by mouth.   ondansetron 4 MG tablet Commonly known as: ZOFRAN Take 4 mg by mouth 2 (two) times daily as needed for nausea or vomiting.   OXYGEN Inhale 2 L/min into the lungs continuous.   polyethylene glycol 17 g packet Commonly known as: MIRALAX / GLYCOLAX Take 17 g by mouth daily.   rosuvastatin 40 MG tablet Commonly known as: CRESTOR Take 40 mg by mouth every evening.   Senna Plus 8.6-50 MG Caps Generic drug: Sennosides-Docusate Sodium Take 2 capsules by mouth 2 (two) times daily as needed.   telmisartan 80 MG tablet Commonly known as: MICARDIS Take 80 mg by mouth daily. At night   traMADol 50 MG tablet Commonly known as: ULTRAM Take 50 mg by mouth every 6 (six) hours as needed.   traZODone 100 MG tablet Commonly known as: DESYREL Take 100 mg by mouth at bedtime.   triamcinolone cream 0.5 % Commonly known as: KENALOG Apply 1 application topically as needed (for irritation).   UNABLE TO FIND CPAP @@ Bedtime   Vitamin D3 125 MCG (5000 UT) Tabs Take 5,000 Units by mouth daily.       Allergies:  Allergies  Allergen Reactions  . Ace Inhibitors Other (See Comments)    Reaction:  Unknown   . Amlodipine Other (See Comments)    Reaction:  Unknown   . Clonidine Hydrochloride Other (See Comments)    Reaction:  Unknown   . Codeine Hives  . Latex Hives  . Metformin Nausea And Vomiting  . Tape Rash    Family History: Family History  Problem Relation Age of Onset  . Kidney failure Mother   . Diabetes Mother   . Aortic aneurysm Father   . Coronary artery disease Father   . Heart attack Father   .  Diabetes Sister   . Diabetes Brother   . Kidney failure Brother  Social History:   reports that she has never smoked. She has never used smokeless tobacco. She reports that she does not drink alcohol and does not use drugs.  Physical Exam: BP (!) 158/81   Pulse 73   LMP 02/01/1986 (Approximate)   Constitutional:  Alert, no acute distress, nontoxic appearing HEENT: [place], AT Cardiovascular: No clubbing, cyanosis, or edema Respiratory: Normal respiratory effort, no increased work of breathing GU: No pressure ulcers or wounds of the labia Skin: No rashes, bruises or suspicious lesions Neurologic: Requires heavy assist with standing, transfers, and ambulation Psychiatric: Labile mood and affect  Assessment & Plan:   1. Foley catheter problem, subsequent encounter She continues to experience painful bladder spasms versus catheter discomfort.  I offered her Foley catheter exchange in clinic today.  I again spoke with the patient's daughter via speaker phone today and explained that I suspect she would be more comfortable with the suprapubic tube.  As she seems to have the symptoms approximately every 3 weeks, we will plan for catheter exchange every 3 weeks moving forward.  Return in about 3 weeks (around 04/02/2020) for Catheter exchange.  Debroah Loop, PA-C  [facility] [address], [place], [place] [address] [phone]

## 2020-03-12 NOTE — Patient Instructions (Signed)
Indwelling Urinary Catheter Care, Adult An indwelling urinary catheter is a thin tube that is put into your bladder. The tube helps to drain pee (urine) out of your body. The tube goes in through your urethra. Your urethra is where pee comes out of your body. Your pee will come out through the catheter, then it will go into a bag (drainage bag). Take good care of your catheter so it will work well. How to wear your catheter and bag Supplies needed  Sticky tape (adhesive tape) or a leg strap.  Alcohol wipe or soap and water (if you use tape).  A clean towel (if you use tape).  Large overnight bag.  Smaller bag (leg bag). Wearing your catheter Attach your catheter to your leg with tape or a leg strap.  Make sure the catheter is not pulled tight.  If a leg strap gets wet, take it off and put on a dry strap.  If you use tape to hold the bag on your leg: 1. Use an alcohol wipe or soap and water to wash your skin where the tape made it sticky before. 2. Use a clean towel to pat-dry that skin. 3. Use new tape to make the bag stay on your leg. Wearing your bags You should have been given a large overnight bag.  You may wear the overnight bag in the day or night.  Always have the overnight bag lower than your bladder.  Do not let the bag touch the floor.  Before you go to sleep, put a clean plastic bag in a wastebasket. Then hang the overnight bag inside the wastebasket. You should also have a smaller leg bag that fits under your clothes.  Always wear the leg bag below your knee.  Do not wear your leg bag at night. How to care for your skin and catheter Supplies needed  A clean washcloth.  Water and mild soap.  A clean towel. Caring for your skin and catheter      Clean the skin around your catheter every day: 1. Wash your hands with soap and water. 2. Wet a clean washcloth in warm water and mild soap. 3. Clean the skin around your urethra.  If you are  female:  Gently spread the folds of skin around your vagina (labia).  With the washcloth in your other hand, wipe the inner side of your labia on each side. Wipe from front to back.  If you are female:  Pull back any skin that covers the end of your penis (foreskin).  With the washcloth in your other hand, wipe your penis in small circles. Start wiping at the tip of your penis, then move away from the catheter.  Move the foreskin back in place, if needed. 4. With your free hand, hold the catheter close to where it goes into your body.  Keep holding the catheter during cleaning so it does not get pulled out. 5. With the washcloth in your other hand, clean the catheter.  Only wipe downward on the catheter.  Do not wipe upward toward your body. Doing this may push germs into your urethra and cause infection. 6. Use a clean towel to pat-dry the catheter and the skin around it. Make sure to wipe off all soap. 7. Wash your hands with soap and water.  Shower every day. Do not take baths.  Do not use cream, ointment, or lotion on the area where the catheter goes into your body, unless your doctor tells you   to.  Do not use powders, sprays, or lotions on your genital area.  Check your skin around the catheter every day for signs of infection. Check for: ? Redness, swelling, or pain. ? Fluid or blood. ? Warmth. ? Pus or a bad smell. How to empty the bag Supplies needed  Rubbing alcohol.  Gauze pad or cotton ball.  Tape or a leg strap. Emptying the bag Pour the pee out of your bag when it is ?- full, or at least 2-3 times a day. Do this for your overnight bag and your leg bag. 1. Wash your hands with soap and water. 2. Separate (detach) the bag from your leg. 3. Hold the bag over the toilet or a clean pail. Keep the bag lower than your hips and bladder. This is so the pee (urine) does not go back into the tube. 4. Open the pour spout. It is at the bottom of the bag. 5. Empty the  pee into the toilet or pail. Do not let the pour spout touch any surface. 6. Put rubbing alcohol on a gauze pad or cotton ball. 7. Use the gauze pad or cotton ball to clean the pour spout. 8. Close the pour spout. 9. Attach the bag to your leg with tape or a leg strap. 10. Wash your hands with soap and water. Follow instructions for cleaning the drainage bag:  From the product maker.  As told by your doctor. How to change the bag Supplies needed  Alcohol wipes.  A clean bag.  Tape or a leg strap. Changing the bag Replace your bag when it starts to leak, smell bad, or look dirty. 1. Wash your hands with soap and water. 2. Separate the dirty bag from your leg. 3. Pinch the catheter with your fingers so that pee does not spill out. 4. Separate the catheter tube from the bag tube where these tubes connect (at the connection valve). Do not let the tubes touch any surface. 5. Clean the end of the catheter tube with an alcohol wipe. Use a different alcohol wipe to clean the end of the bag tube. 6. Connect the catheter tube to the tube of the clean bag. 7. Attach the clean bag to your leg with tape or a leg strap. Do not make the bag tight on your leg. 8. Wash your hands with soap and water. General rules   Never pull on your catheter. Never try to take it out. Doing that can hurt you.  Always wash your hands before and after you touch your catheter or bag. Use a mild, fragrance-free soap. If you do not have soap and water, use hand sanitizer.  Always make sure there are no twists or bends (kinks) in the catheter tube.  Always make sure there are no leaks in the catheter or bag.  Drink enough fluid to keep your pee pale yellow.  Do not take baths, swim, or use a hot tub.  If you are female, wipe from front to back after you poop (have a bowel movement). Contact a doctor if:  Your pee is cloudy.  Your pee smells worse than usual.  Your catheter gets clogged.  Your catheter  leaks.  Your bladder feels full. Get help right away if:  You have redness, swelling, or pain where the catheter goes into your body.  You have fluid, blood, pus, or a bad smell coming from the area where the catheter goes into your body.  Your skin feels warm where   the catheter goes into your body.  You have a fever.  You have pain in your: ? Belly (abdomen). ? Legs. ? Lower back. ? Bladder.  You see blood in the catheter.  Your pee is pink or red.  You feel sick to your stomach (nauseous).  You throw up (vomit).  You have chills.  Your pee is not draining into the bag.  Your catheter gets pulled out. Summary  An indwelling urinary catheter is a thin tube that is placed into the bladder to help drain pee (urine) out of the body.  The catheter is placed into the part of the body that drains pee from the bladder (urethra).  Taking good care of your catheter will keep it working properly and help prevent problems.  Always wash your hands before and after touching your catheter or bag.  Never pull on your catheter or try to take it out. This information is not intended to replace advice given to you by your health care provider. Make sure you discuss any questions you have with your health care provider. Document Revised: 12/14/2018 Document Reviewed: 04/07/2017 Elsevier Patient Education  2020 [employer]  

## 2020-03-12 NOTE — Progress Notes (Signed)
Cath Change/ Replacement  Patient is present today for a catheter change due to urinary retention.  16ml of water was removed from the balloon, a 16FR foley cath was removed without difficulty.  Patient was cleaned and prepped in a sterile fashion with betadine. A 16 FR foley cath was replaced into the bladder no complications were noted Urine return was noted 79ml and urine was clear in color. The balloon was filled with 32ml of sterile water. A night bag was attached for drainage.  Patient declined additional drainage bags. Patient was given proper instruction on catheter care.    Performed by: Debroah Loop, PA-C

## 2020-03-17 ENCOUNTER — Ambulatory Visit: Payer: Self-pay | Admitting: Physician Assistant

## 2020-04-02 ENCOUNTER — Encounter: Payer: Self-pay | Admitting: Physician Assistant

## 2020-04-02 ENCOUNTER — Ambulatory Visit (INDEPENDENT_AMBULATORY_CARE_PROVIDER_SITE_OTHER): Payer: Medicare Other | Admitting: Physician Assistant

## 2020-04-02 ENCOUNTER — Other Ambulatory Visit: Payer: Self-pay

## 2020-04-02 VITALS — BP 120/67 | HR 69 | Ht 63.0 in | Wt 235.0 lb

## 2020-04-02 DIAGNOSIS — R339 Retention of urine, unspecified: Secondary | ICD-10-CM

## 2020-04-02 NOTE — Progress Notes (Signed)
Cath Change/ Replacement  Patient is present today for a catheter change due to urinary retention.  27ml of water was removed from the balloon, a 16FR foley cath was removed without difficulty.  Patient was cleaned and prepped in a sterile fashion with betadine. A 16 FR foley cath was replaced into the bladder no complications were noted Urine return was noted 70ml and urine was yellow in color. The balloon was filled with 42ml of sterile water. A night bag was attached for drainage.  Patient declined additional supplies.  Performed by: Debroah Loop, PA-C and Bradly Bienenstock, CMA  Follow up: No follow-ups on file.

## 2020-04-20 ENCOUNTER — Encounter: Payer: Self-pay | Admitting: Physician Assistant

## 2020-04-20 ENCOUNTER — Other Ambulatory Visit: Payer: Self-pay

## 2020-04-20 ENCOUNTER — Ambulatory Visit (INDEPENDENT_AMBULATORY_CARE_PROVIDER_SITE_OTHER): Payer: Medicare Other | Admitting: Physician Assistant

## 2020-04-20 VITALS — BP 147/77 | HR 69 | Temp 98.2°F

## 2020-04-20 DIAGNOSIS — T83021A Displacement of indwelling urethral catheter, initial encounter: Secondary | ICD-10-CM | POA: Diagnosis not present

## 2020-04-20 DIAGNOSIS — R410 Disorientation, unspecified: Secondary | ICD-10-CM

## 2020-04-20 LAB — URINALYSIS, COMPLETE
Bilirubin, UA: NEGATIVE
Glucose, UA: NEGATIVE
Ketones, UA: NEGATIVE
Nitrite, UA: NEGATIVE
Specific Gravity, UA: 1.015 (ref 1.005–1.030)
Urobilinogen, Ur: 0.2 mg/dL (ref 0.2–1.0)
pH, UA: 7 (ref 5.0–7.5)

## 2020-04-20 LAB — MICROSCOPIC EXAMINATION

## 2020-04-20 NOTE — Progress Notes (Signed)
Simple Catheter Placement  Due to urinary retention patient is present today for a foley cath placement.  Patient was cleaned and prepped in a sterile fashion with betadine. A 16 FR foley catheter was inserted, urine return was noted  360ml, urine was yellow in color.  The balloon was filled with 10cc of sterile water.  A night bag was attached for drainage and secured to the patient's right thigh with a StatLock. Patient declined additional supplies. Patient tolerated well, no complications were noted.   Performed by: Debroah Loop, PA-C and Verlene Mayer, CMA

## 2020-04-20 NOTE — Progress Notes (Signed)
04/20/2020 2:29 PM   Dawn Bradford [birth year] [id number]  CC: Chief Complaint  Patient presents with  . Other    Catheter problems    HPI: Dawn Bradford is a 84 y.o. female with PMH ESRD on HD, urinary retention managed with chronic indwelling Foley, and ongoing catheter discomfort including bladder spasms on Levsin who presents today for evaluation of catheter problems.  She is accompanied by her caregiver, who contributes to HPI.  Today, caregiver reports Dawn Bradford has reported genital pain over the past 1 to 2 days.  They have noticed some hematuria as well.  Lastly, she appears more confused than normal and they are concerned for possible UTI.  She is due for HD later today.  Dawn Bradford reports she is feeling well today with no acute concerns.  PMH: Past Medical History:  Diagnosis Date  . ACE-inhibitor cough   . Aortic valve disorder    Echo in 2009 showed mean aortic valve gradient of 13 mmHg, suggesting very mild stenosis. Echo (12/10) suggested aortic sclerosis only  . Carotid artery disease (HCC)    mild, carotid dopplers 12/2009  . CHF (congestive heart failure) ([place])   . COPD (chronic obstructive pulmonary disease) ([place])   . Coronary artery disease    s/p anterior MI in 1996 followed by CABG. Lexiscan myoview (4/11): EF 67%, normal perfusion with no evidence for ischemia or infarction.   . Diabetes mellitus    type 2  . Diastolic heart failure    most recent echo (12/10) showed EF 55-60% with mild LVH, grade I diastolic dysfunction, mild LAE.  [name] ESRD (end stage renal disease) on dialysis ([place])   . Hyperlipidemia   . Hypertension    resistant hyptertension times many years. the patient does have renal artery stenosis. she has tried calcium channel blockers in the past and states that she would not take them now because she had some problems with her gums which her dentist identified as calcium-channel blocker side effects.  . Hypothyroidism   . Mild asthma    PFT 02/05/10  FEV1 1.38, FEV1% 73, TLC 3.78 (86%), DLCO 48%, +BD  . Myocardial infarction ([place])   . Obesity   . Obesity hypoventilation syndrome ([place])       . Obstructive sleep apnea    PSG 01/05/2006 AHI 24.8, CPAP 9cm H2O  . Pulmonary nodule, right   . Renal artery stenosis [facility])    The patient has an occluded right renal artery and an atrophic right kidney. There is 20% left renal artery stenosis. This was seen by catheterization in 2007  . Stroke [facility])     Surgical History: Past Surgical History:  Procedure Laterality Date  . A/V FISTULAGRAM N/A 01/25/2018   Procedure: A/V FISTULAGRAM;  Surgeon: Algernon Huxley, MD;  Location: [place] [facility];  Service: Cardiovascular;  Laterality: N/A;  . A/V FISTULAGRAM Left 02/21/2019   Procedure: A/V FISTULAGRAM;  Surgeon: Algernon Huxley, MD;  Location: [place] [facility];  Service: Cardiovascular;  Laterality: Left;  . A/V FISTULAGRAM Left 10/24/2019   Procedure: A/V FISTULAGRAM;  Surgeon: Algernon Huxley, MD;  Location: [place] [facility];  Service: Cardiovascular;  Laterality: Left;  . ABDOMINAL HYSTERECTOMY  1985  . AV FISTULA PLACEMENT    . CATARACT EXTRACTION  2010  . CORONARY ARTERY BYPASS GRAFT  1996  . DIALYSIS FISTULA CREATION    . [place]   right  . INTRAMEDULLARY (IM) NAIL INTERTROCHANTERIC  Right 08/17/2018   Procedure: INTRAMEDULLARY (IM) NAIL INTERTROCHANTRIC;  Surgeon: Earnestine Leys, MD;  Location: [facility] OR[facility];  Service: Orthopedics;  Laterality: Right;  . PERIPHERAL VASCULAR BALLOON ANGIOPLASTY  02/21/2019   Procedure: PERIPHERAL VASCULAR BALLOON ANGIOPLASTY;  Surgeon: Algernon Huxley, MD;  Location: [place] [facility];  Service: Cardiovascular;;  . PERIPHERAL VASCULAR CATHETERIZATION Left 01/06/2015   Procedure: A/V Shuntogram/Fistulagram;  Surgeon: Katha Cabal, MD;  Location: [place] [facility];  Service: Cardiovascular;  Laterality: Left;  . PERIPHERAL VASCULAR CATHETERIZATION N/A 02/17/2015   Procedure: Dialysis/Perma  Catheter Removal;  Surgeon: Katha Cabal, MD;  Location: [place] [facility];  Service: Cardiovascular;  Laterality: N/A;  . PERIPHERAL VASCULAR CATHETERIZATION N/A 01/28/2016   Procedure: A/V Shuntogram/Fistulagram;  Surgeon: Algernon Huxley, MD;  Location: [place] [facility];  Service: Cardiovascular;  Laterality: N/A;  . PERIPHERAL VASCULAR CATHETERIZATION N/A 01/28/2016   Procedure: A/V Shunt Intervention;  Surgeon: Algernon Huxley, MD;  Location: [place] [facility];  Service: Cardiovascular;  Laterality: N/A;  . TUBAL LIGATION  1973    Home Medications:  Allergies as of 04/20/2020      Reactions   Ace Inhibitors Other (See Comments)   Reaction:  Unknown    Amlodipine Other (See Comments)   Reaction:  Unknown    Clonidine Hydrochloride Other (See Comments)   Reaction:  Unknown    Codeine Hives   Latex Hives   Metformin Nausea And Vomiting   Tape Rash      Medication List       Accurate as of April 20, 2020  2:29 PM. If you have any questions, ask your nurse or doctor.        acetaminophen 325 MG tablet Commonly known as: TYLENOL Take 2 tablets (650 mg total) by mouth every 6 (six) hours as needed for mild pain, fever or headache (or Fever >/= 101).   albuterol 108 (90 Base) MCG/ACT inhaler Commonly known as: VENTOLIN HFA Inhale 2 puffs into the lungs every 6 (six) hours as needed for wheezing or shortness of breath.   b complex-vitamin c-folic acid 0.8 MG Tabs tablet Take 1 tablet by mouth daily.   carvedilol 25 MG tablet Commonly known as: COREG Take 25 mg by mouth 2 (two) times daily.   DERMACLOUD EX Apply liberal amount topically to area of skin irritation as needed.  OK to leave at bedside   feeding supplement (PRO-STAT SUGAR FREE 64) Liqd Take 30 mLs by mouth 2 (two) times daily.   ferrous fumarate-iron polysaccharide complex 162-115.2 MG Caps capsule Commonly known as: TANDEM Take 1 capsule by mouth daily.   ferrous sulfate 325 (65 FE) MG tablet Take  325 mg by mouth daily with breakfast.   FLUoxetine 40 MG capsule Commonly known as: PROZAC Take 50 mg by mouth daily.   FLUoxetine 20 MG capsule Commonly known as: PROZAC Take 40 mg by mouth daily.   Fluoxetine HCl (PMDD) 20 MG Tabs fluoxetine 20 mg tablet   Fluticasone-Salmeterol 100-50 MCG/DOSE Aepb Commonly known as: ADVAIR Inhale 1 puff into the lungs 2 (two) times daily.   furosemide 20 MG tablet Commonly known as: LASIX Take 20 mg by mouth once a week. Give on Thursday   furosemide 80 MG tablet Commonly known as: LASIX Give 1 tablet by mouth daily on Sunday, Tuesday and Saturday   hydrALAZINE 50 MG tablet Commonly known as: APRESOLINE Take 50 mg by mouth 2 (two) times daily.   hyoscyamine 0.125 MG tablet  Commonly known as: Levsin Take 1 tablet (0.125 mg total) by mouth every 4 (four) hours as needed.   ipratropium 0.02 % nebulizer solution Commonly known as: ATROVENT Take 0.5 mg by nebulization every 6 (six) hours as needed for wheezing or shortness of breath.   isosorbide mononitrate 30 MG 24 hr tablet Commonly known as: IMDUR Take 30 mg by mouth at bedtime.   levothyroxine 75 MCG tablet Commonly known as: SYNTHROID Take 75 mcg by mouth daily.   lidocaine-prilocaine cream Commonly known as: EMLA Apply 1 application topically as needed (for pain).   melatonin 5 MG Tabs Take 5 mg by mouth at bedtime.   methocarbamol 500 MG tablet Commonly known as: ROBAXIN Take 500 mg by mouth every 8 (eight) hours as needed for muscle spasms.   midodrine 5 MG tablet Commonly known as: PROAMATINE Take 1 tablet (5 mg total) by mouth 2 (two) times daily with a meal.   NON FORMULARY Diet Type:  Renal, NAS, NCS, 1200 cc fluid restriction   nystatin powder Generic drug: nystatin Apply 1 g topically as needed (for irritation).   omeprazole 10 MG capsule Commonly known as: PRILOSEC Take 10 mg by mouth daily.   ondansetron 4 MG disintegrating tablet Commonly known  as: ZOFRAN-ODT Take by mouth.   ondansetron 4 MG tablet Commonly known as: ZOFRAN Take 4 mg by mouth 2 (two) times daily as needed for nausea or vomiting.   OXYGEN Inhale 2 L/min into the lungs continuous.   polyethylene glycol 17 g packet Commonly known as: MIRALAX / GLYCOLAX Take 17 g by mouth daily.   rosuvastatin 40 MG tablet Commonly known as: CRESTOR Take 40 mg by mouth every evening.   Senna Plus 8.6-50 MG Caps Generic drug: Sennosides-Docusate Sodium Take 2 capsules by mouth 2 (two) times daily as needed.   telmisartan 80 MG tablet Commonly known as: MICARDIS Take 80 mg by mouth daily. At night   traMADol 50 MG tablet Commonly known as: ULTRAM Take 50 mg by mouth every 6 (six) hours as needed.   traZODone 100 MG tablet Commonly known as: DESYREL Take 100 mg by mouth at bedtime.   triamcinolone cream 0.5 % Commonly known as: KENALOG Apply 1 application topically as needed (for irritation).   UNABLE TO FIND CPAP @@ Bedtime   Vitamin D3 125 MCG (5000 UT) Tabs Take 5,000 Units by mouth daily.       Allergies:  Allergies  Allergen Reactions  . Ace Inhibitors Other (See Comments)    Reaction:  Unknown   . Amlodipine Other (See Comments)    Reaction:  Unknown   . Clonidine Hydrochloride Other (See Comments)    Reaction:  Unknown   . Codeine Hives  . Latex Hives  . Metformin Nausea And Vomiting  . Tape Rash    Family History: Family History  Problem Relation Age of Onset  . Kidney failure Mother   . Diabetes Mother   . Aortic aneurysm Father   . Coronary artery disease Father   . Heart attack Father   . Diabetes Sister   . Diabetes Brother   . Kidney failure Brother     Social History:   reports that she has never smoked. She has never used smokeless tobacco. She reports that she does not drink alcohol and does not use drugs.  Physical Exam: BP (!) 147/77   Pulse 69   Temp 98.2 F (36.8 C) (Oral)   LMP 02/01/1986 (Approximate)     Constitutional:  Awake, no acute distress, nontoxic appearing HEENT: [place], AT Cardiovascular: No clubbing, cyanosis, or edema Respiratory: Normal respiratory effort, no increased work of breathing Skin: No rashes, bruises or suspicious lesions GU: Foley catheter noted to have come out with balloon still inflated.  She has had some urinary leakage and bleeding into her absorbent underwear secondary to this. Neurologic: Requires heavy assist with transfers and ambulation Psychiatric: At baseline  Laboratory Data: Results for orders placed or performed in visit on 04/20/20  Microscopic Examination   Urine  Result Value Ref Range   WBC, UA 6-10 (A) 0 - 5 /hpf   RBC 0-2 0 - 2 /hpf   Epithelial Cells (non renal) 0-10 0 - 10 /hpf   Bacteria, UA Many (A) None seen/Few  Urinalysis, Complete  Result Value Ref Range   Specific Gravity, UA 1.015 1.005 - 1.030   pH, UA 7.0 5.0 - 7.5   Color, UA Yellow Yellow   Appearance Ur Clear Clear   Leukocytes,UA Trace (A) Negative   Protein,UA 2+ (A) Negative/Trace   Glucose, UA Negative Negative   Ketones, UA Negative Negative   RBC, UA 2+ (A) Negative   Bilirubin, UA Negative Negative   Urobilinogen, Ur 0.2 0.2 - 1.0 mg/dL   Nitrite, UA Negative Negative   Microscopic Examination See below:    Assessment & Plan:   1. Displacement of Foley catheter, initial encounter ([place]) Foley catheter displaced on physical exam today, likely the source of her genital pain and bleeding.  I proceeded with Foley catheter replacement in clinic today, see separate procedure note for details.  Ultimately I drained approximately 300 cc of urine from her bladder.    2. Confusion I obtained a clean urine sample from her new catheter today for urine testing, though my threshold for treatment is high in this patient with a chronic indwelling Foley.  I suspect her confusion is more likely secondary to uremia with hemodialysis later today and with a full urinary bladder. -  Urinalysis, Complete   Return in about 3 weeks (around 05/11/2020) for Catheter exchange.  Debroah Loop, PA-C  [facility] [address] [place], [place] [address] [phone]

## 2020-04-21 ENCOUNTER — Telehealth: Payer: Self-pay | Admitting: Physician Assistant

## 2020-04-21 ENCOUNTER — Ambulatory Visit: Payer: Medicare Other | Admitting: Physician Assistant

## 2020-04-21 NOTE — Telephone Encounter (Signed)
Please contact Dawn Bradford and inquire if [name]'s mental status has improved today and if she is reporting any lower abdominal or low back pain. If she is doing better today, I am reassured for UTI and we will defer treatment.

## 2020-04-22 NOTE — Telephone Encounter (Signed)
Contacted Deanna as advised. Deanna does not live in the same home as patient. Deanna is contacting the caregiver and will call back.

## 2020-04-22 NOTE — Telephone Encounter (Signed)
Dawn Bradford reports patient as still having an altered mental status. She also reports patient's pain has improved to little or none. Overall patient seems to be doing better besides the altered mental status.

## 2020-04-29 ENCOUNTER — Ambulatory Visit (INDEPENDENT_AMBULATORY_CARE_PROVIDER_SITE_OTHER): Payer: Medicare Other | Admitting: Physician Assistant

## 2020-04-29 ENCOUNTER — Other Ambulatory Visit: Payer: Self-pay

## 2020-04-29 DIAGNOSIS — T83021A Displacement of indwelling urethral catheter, initial encounter: Secondary | ICD-10-CM

## 2020-04-29 NOTE — Progress Notes (Signed)
Simple Catheter Placement  Due to patient accidentally pulling out existing catheter patient is present today for a foley cath replacement.  Patient was cleaned and prepped in a sterile fashion with betadine. A 16 FR foley catheter was inserted, urine return was noted  115ml, urine was clear yellow in color.  The balloon was filled with 20cc of sterile water.  A night bag was attached for drainage.   Patient was given instruction on proper catheter care.  Patient tolerated well, It was noted that patient had redness and swelling around the urethra, no pain or blood noted   Performed by: Fonnie Jarvis, CMA  Additional notes/ Follow up: 3wk cath exchange

## 2020-05-12 ENCOUNTER — Ambulatory Visit: Payer: Self-pay | Admitting: Physician Assistant

## 2020-05-21 ENCOUNTER — Ambulatory Visit (INDEPENDENT_AMBULATORY_CARE_PROVIDER_SITE_OTHER): Payer: Medicare Other | Admitting: Physician Assistant

## 2020-05-21 ENCOUNTER — Encounter: Payer: Self-pay | Admitting: Physician Assistant

## 2020-05-21 ENCOUNTER — Other Ambulatory Visit: Payer: Self-pay

## 2020-05-21 VITALS — BP 158/70 | HR 64 | Ht 66.0 in | Wt 235.0 lb

## 2020-05-21 DIAGNOSIS — R339 Retention of urine, unspecified: Secondary | ICD-10-CM

## 2020-05-21 NOTE — Progress Notes (Signed)
Cath Change/ Replacement  Patient is present today for a catheter change due to urinary retention.  55ml of water was removed from the balloon, a 16FR foley cath was removed without difficulty.  Patient was cleaned and prepped in a sterile fashion with betadine. A 16FR foley cath was replaced into the bladder no complications were noted Urine return was noted 39ml and urine was yellow in color. The balloon was filled with 14ml of sterile water. A night bag was attached for drainage.      Performed by: Debroah Loop, PA-C   Follow up: Return in about 3 weeks (around 06/11/2020) for Catheter exchange.

## 2020-06-08 ENCOUNTER — Ambulatory Visit: Payer: Medicare Other | Admitting: Podiatry

## 2020-06-11 ENCOUNTER — Ambulatory Visit (INDEPENDENT_AMBULATORY_CARE_PROVIDER_SITE_OTHER): Payer: Medicare Other | Admitting: Physician Assistant

## 2020-06-11 ENCOUNTER — Other Ambulatory Visit: Payer: Self-pay

## 2020-06-11 VITALS — BP 152/76 | HR 66 | Ht 63.0 in | Wt 235.0 lb

## 2020-06-11 DIAGNOSIS — R339 Retention of urine, unspecified: Secondary | ICD-10-CM

## 2020-06-11 NOTE — Progress Notes (Signed)
Cath Change/ Replacement  Patient is present today for a catheter change due to urinary retention.  80ml of water was removed from the balloon, a 16FR foley cath was removed without difficulty.  Patient was cleaned and prepped in a sterile fashion with betadine. A 16 FR foley cath was replaced into the bladder no complications were noted Urine return was noted 74ml and urine was yellow in color. The balloon was filled with 50ml of sterile water. A night bag was attached for drainage.     Performed by: Debroah Loop, PA-C and Bradly Bienenstock, CMA  Follow up: Return in about 3 weeks (around 07/02/2020) for Catheter exchange.

## 2020-07-01 NOTE — Progress Notes (Signed)
Cath Change/ Replacement  Patient is present today for a catheter change due to urinary retention.  18 ml of water was removed from the balloon, a 16 FR foley cath was removed with out difficulty.  Patient was cleaned and prepped in a sterile fashion with betadine. A 16 FR foley cath was replaced into the bladder no complications were noted  Urine return was noted 30 ml and urine was yellow clear in color. The balloon was filled with 40ml of sterile water. An overnight bag was attached for drainage.  Patient was given proper instruction on catheter care.    Performed by: Kerman Passey, CMA and Zara Council, PA-C  Follow up: 3 weeks for catheter exchange

## 2020-07-02 ENCOUNTER — Other Ambulatory Visit: Payer: Self-pay

## 2020-07-02 ENCOUNTER — Ambulatory Visit (INDEPENDENT_AMBULATORY_CARE_PROVIDER_SITE_OTHER): Payer: Medicare Other | Admitting: Urology

## 2020-07-02 ENCOUNTER — Encounter: Payer: Self-pay | Admitting: Urology

## 2020-07-02 DIAGNOSIS — Z466 Encounter for fitting and adjustment of urinary device: Secondary | ICD-10-CM | POA: Diagnosis not present

## 2020-07-21 ENCOUNTER — Other Ambulatory Visit: Payer: Self-pay

## 2020-07-21 ENCOUNTER — Ambulatory Visit (INDEPENDENT_AMBULATORY_CARE_PROVIDER_SITE_OTHER): Payer: Medicare Other | Admitting: Physician Assistant

## 2020-07-21 ENCOUNTER — Encounter: Payer: Self-pay | Admitting: Physician Assistant

## 2020-07-21 VITALS — BP 131/70 | HR 68 | Ht 63.0 in | Wt 235.0 lb

## 2020-07-21 DIAGNOSIS — Z466 Encounter for fitting and adjustment of urinary device: Secondary | ICD-10-CM | POA: Diagnosis not present

## 2020-07-21 NOTE — Progress Notes (Signed)
Cath Change/ Replacement  Patient is present today for a catheter change due to urinary retention.  80ml of water was removed from the balloon, a 16FR foley cath was removed without difficulty.  Patient was cleaned and prepped in a sterile fashion with betadine. A 16 FR foley cath was replaced into the bladder no complications were noted Urine return was noted 66ml and urine was yellow in color. The balloon was filled with 49ml of sterile water. A night bag was attached for drainage.    Performed by: Debroah Loop, PA-C   Follow up: Return in about 3 weeks (around 08/11/2020) for Catheter exchange.

## 2020-08-05 IMAGING — XA DG HIP (WITH PELVIS) OPERATIVE*R*
5 series · 5 of 5 positions shown · non-contrast
Comparison: Radiographs 08/11/2018.

CLINICAL DATA: Intramedullary nail placement for intertrochanteric
femur fracture.

EXAM:
OPERATIVE RIGHT HIP (WITH PELVIS IF PERFORMED)  VIEWS
TECHNIQUE: Fluoroscopic spot image(s) were submitted for interpretation
post-operatively.

[Series 11: cont. · 1 of 1 slices shown (1 of 5)]
[im 1/1]
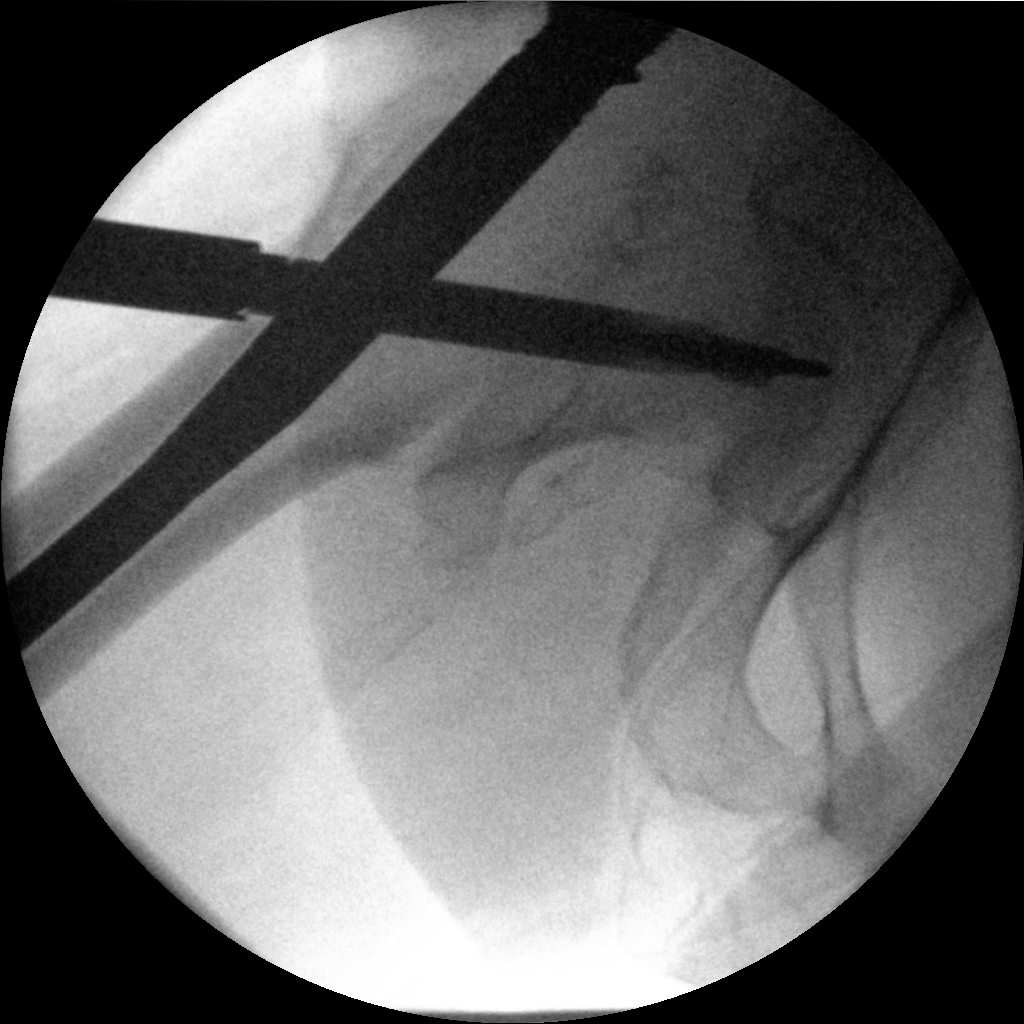

[Series 12: cont. · 1 of 1 slices shown (2 of 5)]
[im 1/1]
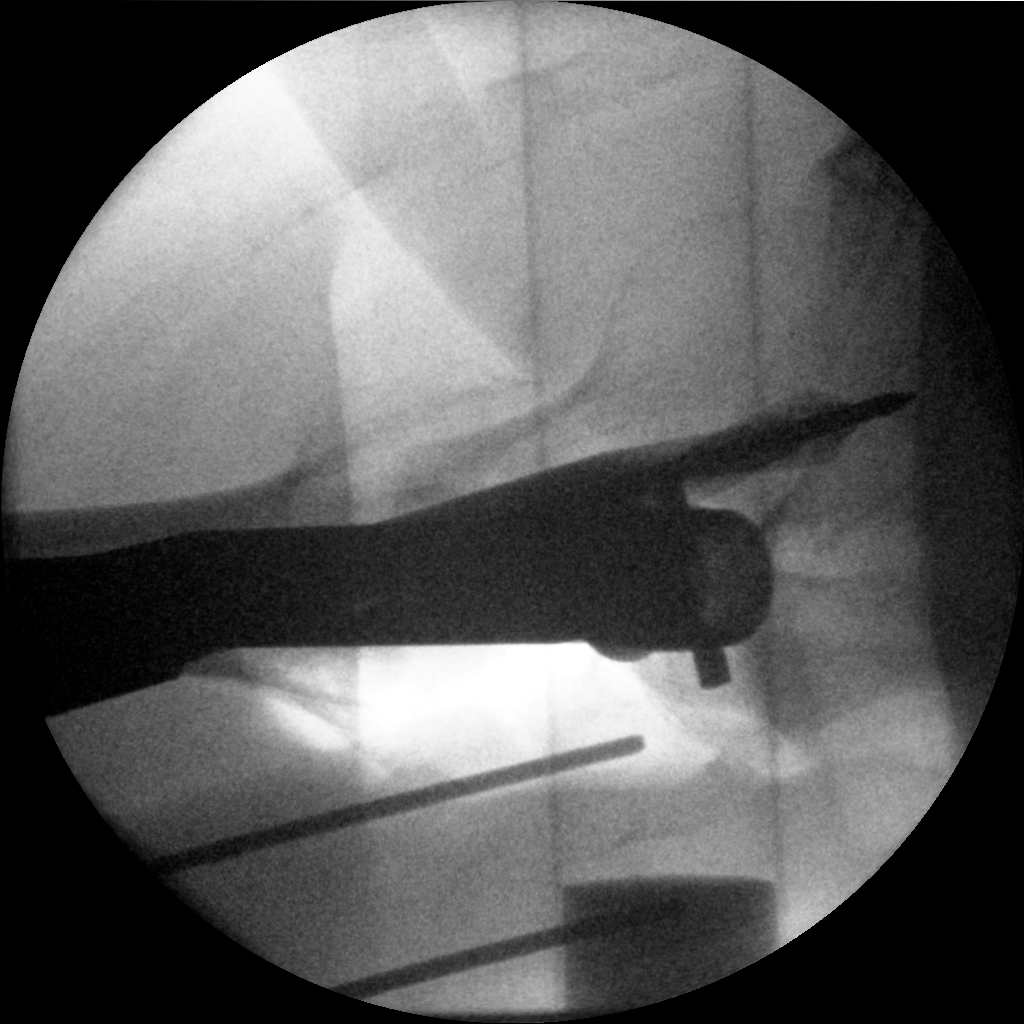

[Series 13: cont. · 1 of 1 slices shown (3 of 5)]
[im 1/1]
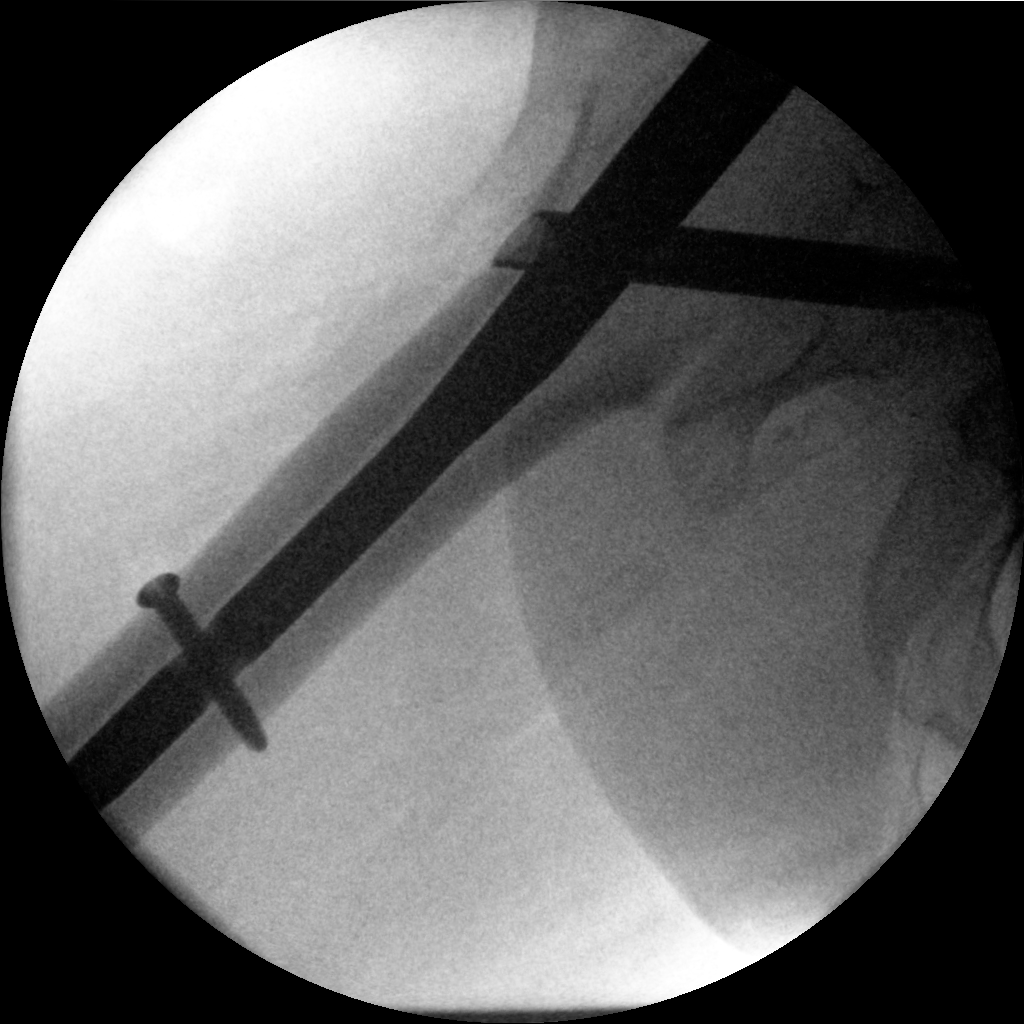

[Series 14: cont. · 1 of 1 slices shown (4 of 5)]
[im 1/1]
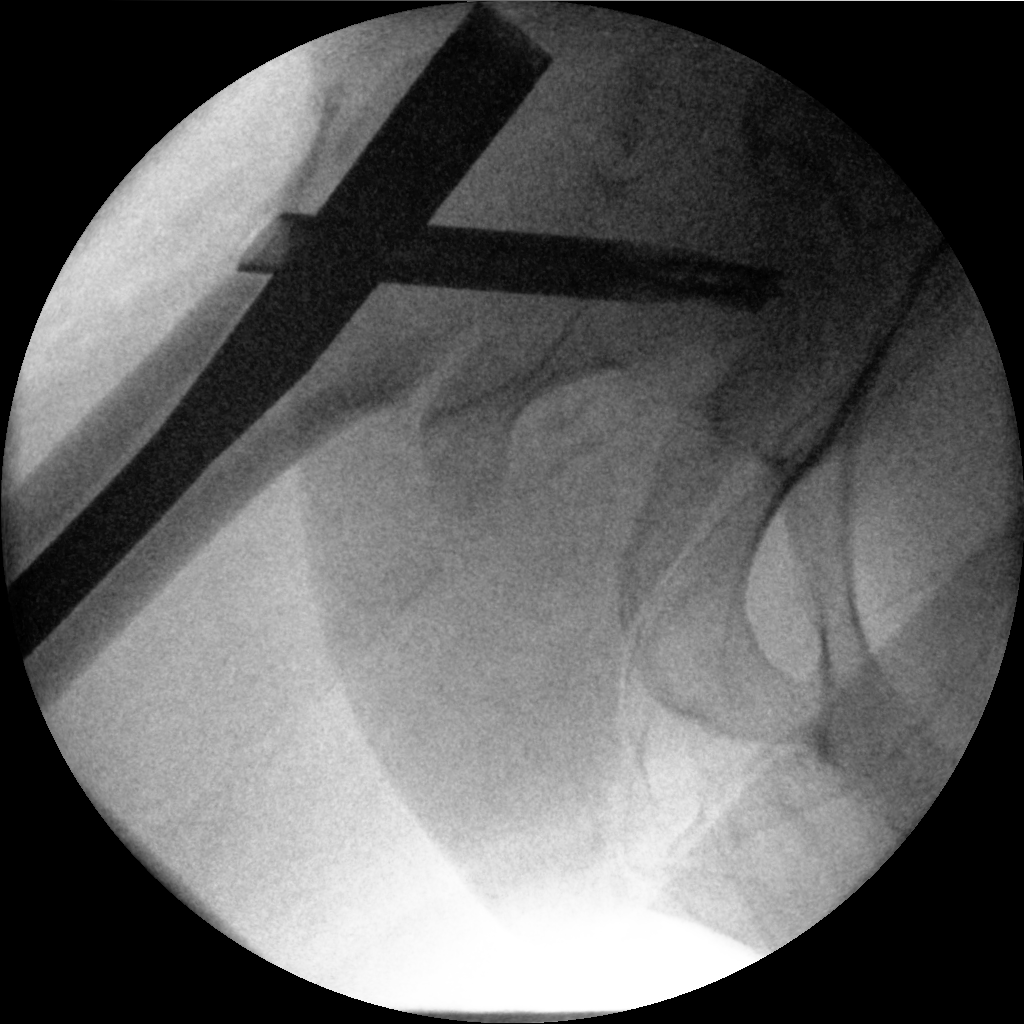

[Series 15: cont. · 1 of 1 slices shown (5 of 5)]
[im 1/1]
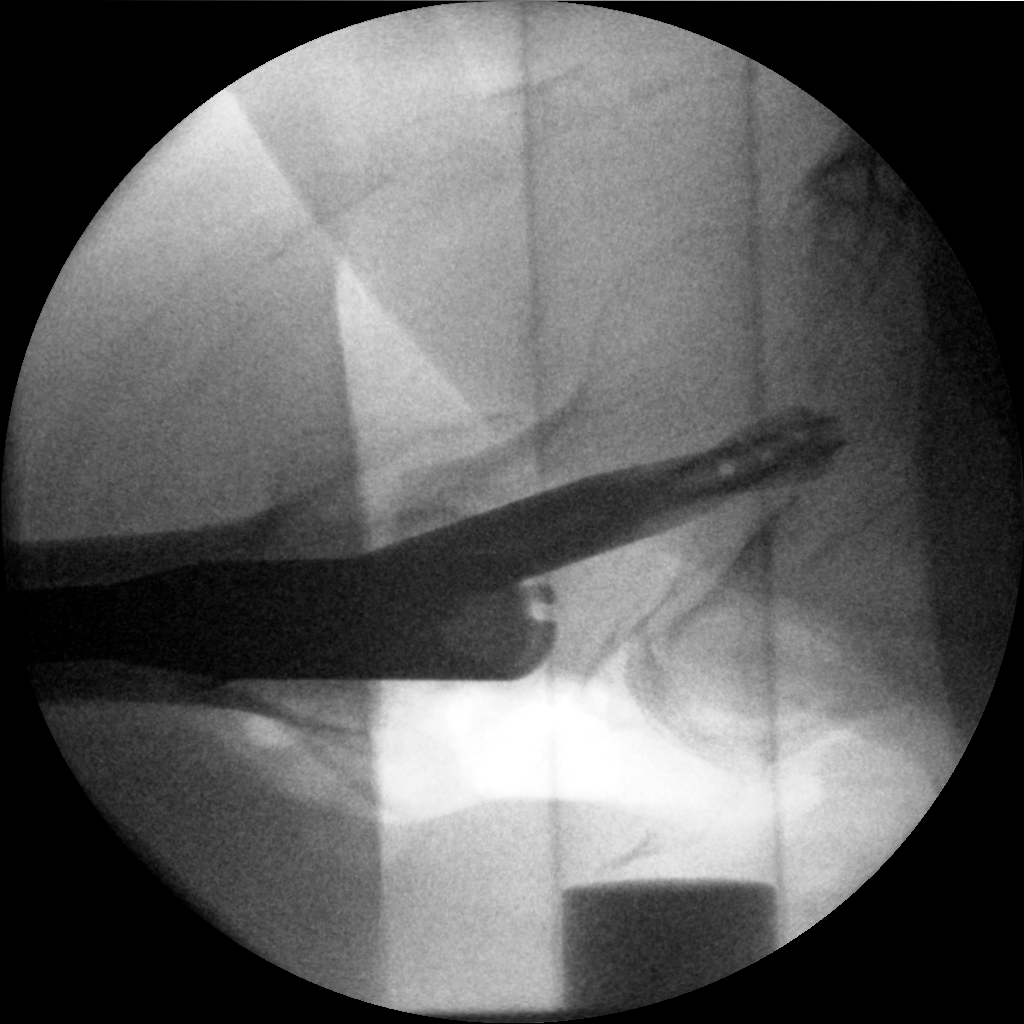

[5 of 5 positions shown; findings below may reference images not displayed]

FINDINGS: Five spot fluoroscopic images demonstrate the placement of a right
femoral intramedullary nail secured by 1 visible distal interlocking
screw. The tip of the intramedullary nail is not visualized. The
alignment of the intertrochanteric fracture appears improved. No
complications are identified.
IMPRESSION: Intraoperative views during ORIF of intertrochanteric right femur
fracture. The distal end of the intramedullary nail is not
visualized.

## 2020-08-11 ENCOUNTER — Ambulatory Visit (INDEPENDENT_AMBULATORY_CARE_PROVIDER_SITE_OTHER): Payer: Medicare Other | Admitting: Physician Assistant

## 2020-08-11 ENCOUNTER — Other Ambulatory Visit: Payer: Self-pay

## 2020-08-11 VITALS — BP 147/71 | HR 68

## 2020-08-11 DIAGNOSIS — Z466 Encounter for fitting and adjustment of urinary device: Secondary | ICD-10-CM | POA: Diagnosis not present

## 2020-08-11 NOTE — Progress Notes (Signed)
Cath Change/ Replacement  Patient is present today for a catheter change due to urinary retention.  78ml of water was removed from the balloon, a 16FR foley cath was removed without difficulty.  Patient was cleaned and prepped in a sterile fashion with betadine. A 16 FR foley cath was replaced into the bladder no complications were noted Urine return was noted 54ml and urine was yellow in color. The balloon was filled with 7ml of sterile water. A night bag was attached for drainage. Patient tolerated well.    Performed by: Debroah Loop, PA-C and Bradly Bienenstock, CMA  Follow up: Return in about 3 weeks (around 09/01/2020) for Catheter exchange.

## 2020-09-01 ENCOUNTER — Ambulatory Visit (INDEPENDENT_AMBULATORY_CARE_PROVIDER_SITE_OTHER): Payer: Medicare Other | Admitting: Physician Assistant

## 2020-09-01 ENCOUNTER — Other Ambulatory Visit: Payer: Self-pay

## 2020-09-01 DIAGNOSIS — Z466 Encounter for fitting and adjustment of urinary device: Secondary | ICD-10-CM | POA: Diagnosis not present

## 2020-09-01 NOTE — Progress Notes (Signed)
Cath Change/ Replacement  Patient is present today for a catheter change due to urinary retention.  11ml of water was removed from the balloon, a 16FR foley cath was removed without difficulty.  Patient was cleaned and prepped in a sterile fashion with betadine. A 16 FR foley cath was replaced into the bladder no complications were noted Urine return was noted <22ml and urine was yellow in color. The balloon was filled with 25ml of sterile water. A night bag was attached for drainage.    Performed by: Debroah Loop, PA-C and Bradly Bienenstock, CMA  Follow up: Return in about 3 weeks (around 09/22/2020) for Catheter exchange.

## 2020-09-24 ENCOUNTER — Ambulatory Visit (INDEPENDENT_AMBULATORY_CARE_PROVIDER_SITE_OTHER): Payer: Medicare Other | Admitting: Physician Assistant

## 2020-09-24 ENCOUNTER — Other Ambulatory Visit: Payer: Self-pay

## 2020-09-24 DIAGNOSIS — Z466 Encounter for fitting and adjustment of urinary device: Secondary | ICD-10-CM

## 2020-09-24 NOTE — Progress Notes (Signed)
Cath Change/ Replacement  Patient is present today for a catheter change due to urinary retention.  1m of water was removed from the balloon, a 16FR foley cath was removed without difficulty.  Patient was cleaned and prepped in a sterile fashion with betadine. A 16 FR foley cath was replaced into the bladder no complications were noted Urine return was not noted but proper placement was confirmed by inserting a finger into the vagina to verify urethral catheterization. The balloon was filled with 271mof sterile water. A night bag was attached for drainage.  Patient tolerated well.    Performed by: SaDebroah LoopPA-C and CrBradly BienenstockFollow up: Return in about 3 weeks (around 10/15/2020) for Catheter exchange.

## 2020-10-15 ENCOUNTER — Ambulatory Visit (INDEPENDENT_AMBULATORY_CARE_PROVIDER_SITE_OTHER): Payer: Medicare Other | Admitting: Physician Assistant

## 2020-10-15 ENCOUNTER — Other Ambulatory Visit: Payer: Self-pay

## 2020-10-15 DIAGNOSIS — Z466 Encounter for fitting and adjustment of urinary device: Secondary | ICD-10-CM

## 2020-10-15 NOTE — Progress Notes (Signed)
Cath Change/ Replacement  Patient is present today for a catheter change due to urinary retention.  68m of water was removed from the balloon, a 16FR foley cath was removed with out difficulty.  Patient was cleaned and prepped in a sterile fashion with betadine. A 16 FR foley cath was replaced into the bladder no complications were noted Urine return was noted <563mand urine was clear in color. The balloon was filled with 2069mf sterile water. A night bag was attached for drainage.  Patient tolerated well.   Performed by: SamDebroah LoopA-C and CryBradly BienenstockMA  Additional notes: Caregiver requests attempt to push scheduled cath changes to every 4 weeks given patient has been tolerating catheter better lately; I am in agreement with this plan.   Follow up: Return in about 4 weeks (around 11/12/2020) for Catheter exchange.

## 2020-11-12 ENCOUNTER — Ambulatory Visit (INDEPENDENT_AMBULATORY_CARE_PROVIDER_SITE_OTHER): Payer: Medicare Other | Admitting: Physician Assistant

## 2020-11-12 ENCOUNTER — Other Ambulatory Visit: Payer: Self-pay

## 2020-11-12 DIAGNOSIS — Z466 Encounter for fitting and adjustment of urinary device: Secondary | ICD-10-CM

## 2020-11-12 NOTE — Progress Notes (Signed)
Cath Change/ Replacement  Patient is present today for a catheter change due to urinary retention.  80m of water was removed from the balloon, a 16FR foley cath was removed without difficulty.  Patient was cleaned and prepped in a sterile fashion with betadine. A 16 FR foley cath was replaced into the bladder no complications were noted Urine return was not noted and patient was counseled to return to clinic if it had not begun to drain within 2 hours. The balloon was filled with 227mof sterile water. A night bag was attached for drainage.      Performed by: SaDebroah LoopPA-C   Follow up: Return in about 4 weeks (around 12/10/2020) for Catheter exchange.

## 2020-12-03 ENCOUNTER — Emergency Department
Admission: EM | Admit: 2020-12-03 | Discharge: 2020-12-03 | Disposition: A | Discharge status: Home / Self Care | Payer: Medicare Other | Attending: Emergency Medicine | Admitting: Emergency Medicine

## 2020-12-03 ENCOUNTER — Other Ambulatory Visit: Payer: Self-pay

## 2020-12-03 ENCOUNTER — Emergency Department: Payer: Medicare Other

## 2020-12-03 DIAGNOSIS — E1121 Type 2 diabetes mellitus with diabetic nephropathy: Secondary | ICD-10-CM | POA: Insufficient documentation

## 2020-12-03 DIAGNOSIS — R0602 Shortness of breath: Secondary | ICD-10-CM | POA: Diagnosis not present

## 2020-12-03 DIAGNOSIS — I2510 Atherosclerotic heart disease of native coronary artery without angina pectoris: Secondary | ICD-10-CM | POA: Diagnosis not present

## 2020-12-03 DIAGNOSIS — Z992 Dependence on renal dialysis: Secondary | ICD-10-CM | POA: Insufficient documentation

## 2020-12-03 DIAGNOSIS — J449 Chronic obstructive pulmonary disease, unspecified: Secondary | ICD-10-CM | POA: Diagnosis not present

## 2020-12-03 DIAGNOSIS — N186 End stage renal disease: Secondary | ICD-10-CM | POA: Insufficient documentation

## 2020-12-03 DIAGNOSIS — J45909 Unspecified asthma, uncomplicated: Secondary | ICD-10-CM | POA: Diagnosis not present

## 2020-12-03 DIAGNOSIS — E039 Hypothyroidism, unspecified: Secondary | ICD-10-CM | POA: Insufficient documentation

## 2020-12-03 DIAGNOSIS — Z79899 Other long term (current) drug therapy: Secondary | ICD-10-CM | POA: Diagnosis not present

## 2020-12-03 DIAGNOSIS — Z951 Presence of aortocoronary bypass graft: Secondary | ICD-10-CM | POA: Diagnosis not present

## 2020-12-03 DIAGNOSIS — Z91040 Latex allergy status: Secondary | ICD-10-CM | POA: Insufficient documentation

## 2020-12-03 DIAGNOSIS — E1122 Type 2 diabetes mellitus with diabetic chronic kidney disease: Secondary | ICD-10-CM | POA: Diagnosis not present

## 2020-12-03 DIAGNOSIS — I5030 Unspecified diastolic (congestive) heart failure: Secondary | ICD-10-CM | POA: Diagnosis not present

## 2020-12-03 DIAGNOSIS — E11319 Type 2 diabetes mellitus with unspecified diabetic retinopathy without macular edema: Secondary | ICD-10-CM | POA: Insufficient documentation

## 2020-12-03 DIAGNOSIS — I132 Hypertensive heart and chronic kidney disease with heart failure and with stage 5 chronic kidney disease, or end stage renal disease: Secondary | ICD-10-CM | POA: Insufficient documentation

## 2020-12-03 LAB — COMPREHENSIVE METABOLIC PANEL
ALT: 15 U/L (ref 0–44)
AST: 24 U/L (ref 15–41)
Albumin: 3.2 g/dL — ABNORMAL LOW (ref 3.5–5.0)
Alkaline Phosphatase: 72 U/L (ref 38–126)
Anion gap: 9 (ref 5–15)
BUN: 29 mg/dL — ABNORMAL HIGH (ref 8–23)
CO2: 26 mmol/L (ref 22–32)
Calcium: 8.4 mg/dL — ABNORMAL LOW (ref 8.9–10.3)
Chloride: 104 mmol/L (ref 98–111)
Creatinine, Ser: 2.95 mg/dL — ABNORMAL HIGH (ref 0.44–1.00)
GFR, Estimated: 15 mL/min — ABNORMAL LOW (ref >60)
Glucose, Bld: 136 mg/dL — ABNORMAL HIGH (ref 70–99)
Potassium: 5 mmol/L (ref 3.5–5.1)
Sodium: 139 mmol/L (ref 135–145)
Total Bilirubin: 1.1 mg/dL (ref 0.3–1.2)
Total Protein: 6.3 g/dL — ABNORMAL LOW (ref 6.5–8.1)

## 2020-12-03 LAB — CBC WITH DIFFERENTIAL/PLATELET
Abs Immature Granulocytes: 0.02 10*3/uL (ref 0.00–0.07)
Basophils Absolute: 0 10*3/uL (ref 0.0–0.1)
Basophils Relative: 0 %
Eosinophils Absolute: 0.2 10*3/uL (ref 0.0–0.5)
Eosinophils Relative: 4 %
HCT: 28 % — ABNORMAL LOW (ref 36.0–46.0)
Hemoglobin: 9.4 g/dL — ABNORMAL LOW (ref 12.0–15.0)
Immature Granulocytes: 0 %
Lymphocytes Relative: 20 %
Lymphs Abs: 1.2 10*3/uL (ref 0.7–4.0)
MCH: 35.9 pg — ABNORMAL HIGH (ref 26.0–34.0)
MCHC: 33.6 g/dL (ref 30.0–36.0)
MCV: 106.9 fL — ABNORMAL HIGH (ref 80.0–100.0)
Monocytes Absolute: 0.5 10*3/uL (ref 0.1–1.0)
Monocytes Relative: 9 %
Neutro Abs: 3.8 10*3/uL (ref 1.7–7.7)
Neutrophils Relative %: 67 %
Platelets: 102 10*3/uL — ABNORMAL LOW (ref 150–400)
RBC: 2.62 MIL/uL — ABNORMAL LOW (ref 3.87–5.11)
RDW: 12.8 % (ref 11.5–15.5)
WBC: 5.7 10*3/uL (ref 4.0–10.5)
nRBC: 0 % (ref 0.0–0.2)

## 2020-12-03 LAB — TROPONIN I (HIGH SENSITIVITY)
Troponin I (High Sensitivity): 13 ng/L (ref <18)
Troponin I (High Sensitivity): 13 ng/L (ref <18)

## 2020-12-03 NOTE — ED Provider Notes (Signed)
[facility] Emergency Department Provider Note   ____________________________________________   Event Date/Time   First MD Initiated Contact with Patient 12/03/20 1502     (approximate)  I have reviewed the triage vital signs and the nursing notes.   HISTORY  Chief Complaint Shortness of Breath    HPI JEWELZ Bradford is a 85 y.o. female with past medical history of hypertension, hyperlipidemia, diabetes, CAD status post CABG, COPD on 2 L nasal cannula, and ESRD on HD (MWF) who presents to the ED for shortness of breath.  89 of history is obtained from patient's daughter, because patient states she does not remember why she is here.  Daughter states that patient has a live-in caregiver and that patient became anxious and upset earlier in the day today.  She started saying that she was having trouble breathing and so the caregiver called EMS.  Patient currently denies any difficulty breathing or any other complaints.  She specifically denies any fevers, cough, chest pain, nausea, or vomiting.  She has a chronic Foley catheter in place that has been functioning normally.  Daughter states that patient is currently at her baseline mental status.  She received a full run of dialysis yesterday without complication.        Past Medical History:  Diagnosis Date  . ACE-inhibitor cough   . Aortic valve disorder    Echo in 2009 showed mean aortic valve gradient of 13 mmHg, suggesting very mild stenosis. Echo (12/10) suggested aortic sclerosis only  . Carotid artery disease (HCC)    mild, carotid dopplers 12/2009  . CHF (congestive heart failure) ([place])   . COPD (chronic obstructive pulmonary disease) ([place])   . Coronary artery disease    s/p anterior MI in 1996 followed by CABG. Lexiscan myoview (4/11): EF 67%, normal perfusion with no evidence for ischemia or infarction.   . Diabetes mellitus    type 2  . Diastolic heart failure    most recent echo (12/10) showed  EF 55-60% with mild LVH, grade I diastolic dysfunction, mild LAE.  [name] ESRD (end stage renal disease) on dialysis ([place])   . Hyperlipidemia   . Hypertension    resistant hyptertension times many years. the patient does have renal artery stenosis. she has tried calcium channel blockers in the past and states that she would not take them now because she had some problems with her gums which her dentist identified as calcium-channel blocker side effects.  . Hypothyroidism   . Mild asthma    PFT 02/05/10 FEV1 1.38, FEV1% 73, TLC 3.78 (86%), DLCO 48%, +BD  . Myocardial infarction ([place])   . Obesity   . Obesity hypoventilation syndrome ([place])       . Obstructive sleep apnea    PSG 01/05/2006 AHI 24.8, CPAP 9cm H2O  . Pulmonary nodule, right   . Renal artery stenosis [facility])    The patient has an occluded right renal artery and an atrophic right kidney. There is 20% left renal artery stenosis. This was seen by catheterization in 2007  . Stroke [facility])     Patient Active Problem List   Diagnosis Date Noted  . Pain due to onychomycosis of toenails of both feet 11/28/2019  . Pain, generalized 09/12/2018  . Palliative care encounter 09/12/2018  . Anemia due to end stage renal disease ([place]) 09/05/2018  . Cellulitis of right hip 09/01/2018  . Hypertensive heart and renal disease with congestive heart failure and end stage renal disease ([place]) 08/23/2018  .  COPD with asthma ([place]) 08/23/2018  . Acquired hypothyroidism 08/23/2018  . Dyslipidemia associated with type 2 diabetes mellitus ([place]) 08/23/2018  . Chronic kidney disease with end stage renal disease on dialysis due to type 2 diabetes mellitus ([place]) 08/23/2018  . Diabetes mellitus type 2 with retinopathy ([place]) 08/23/2018  . Controlled type 2 diabetes with renal manifestation 08/23/2018  . Chronic cerebrovascular accident (CVA) 08/23/2018  . Chronic constipation 08/23/2018  . Protein-calorie malnutrition, severe ([place]) 08/23/2018  . Closed comminuted  intertrochanteric fracture of proximal end of right femur ([place]) 08/23/2018  . Closed fracture of right hip with routine healing 08/22/2018  . Closed intertrochanteric fracture of hip, right, initial encounter ([place]) 08/11/2018  . Hypoxia 10/12/2017  . Hx of CABG 10/18/2016  . Nonrheumatic aortic valve stenosis 10/18/2016  . ESRD on dialysis ([place]) 07/07/2016  . Background diabetic retinopathy ([place]) 01/04/2016  . Chest pain 08/19/2015  . Coronary artery disease involving native heart with angina pectoris ([place]) 08/18/2015  . BMI 38.0-38.9,adult 07/07/2015  . Cognitive deficit, post-stroke 07/07/2015  . H/O ischemic left MCA stroke 02/19/2015  . Expressive aphasia 02/03/2015  . Carotid stenosis 02/03/2015  . Diabetes mellitus type 2 in obese ([place]) 01/31/2015  . End stage renal disease ([place]) 01/31/2015  . 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 03/08/2011  . Diabetic glomerulopathy ([place]) 03/08/2011  . Edema 03/08/2011  . Obstructive chronic bronchitis without exacerbation ([place]) 03/08/2011  . Recurrent major depressive disorder, in partial remission ([place]) 03/08/2011  . Unspecified cataract 03/08/2011  . Solitary pulmonary nodule 03/08/2011  . Mild intermittent asthma 03/29/2010  . CAD, ARTERY BYPASS GRAFT 03/02/2010  . Obesity hypoventilation syndrome ([place]) 02/05/2010  . Chronic depression 01/15/2010  . Shortness of breath 12/24/2009  . CAROTID BRUIT 12/14/2009  . DIASTOLIC HEART FAILURE, CHRONIC 02/17/2009  . DIABETES MELLITUS, TYPE II 01/29/2009  . Hyperlipidemia 01/29/2009  . Obstructive sleep apnea 01/29/2009  . Essential hypertension 01/29/2009  . Aortic valve disorder 01/29/2009  . RENAL ARTERY STENOSIS 01/29/2009  . RENAL DISEASE, CHRONIC 01/29/2009    Past Surgical History:  Procedure Laterality Date  . A/V FISTULAGRAM N/A 01/25/2018   Procedure: A/V FISTULAGRAM;  Surgeon: Algernon Huxley, MD;  Location: [place] [facility];  Service: Cardiovascular;   Laterality: N/A;  . A/V FISTULAGRAM Left 02/21/2019   Procedure: A/V FISTULAGRAM;  Surgeon: Algernon Huxley, MD;  Location: [place] [facility];  Service: Cardiovascular;  Laterality: Left;  . A/V FISTULAGRAM Left 10/24/2019   Procedure: A/V FISTULAGRAM;  Surgeon: Algernon Huxley, MD;  Location: [place] [facility];  Service: Cardiovascular;  Laterality: Left;  . ABDOMINAL HYSTERECTOMY  1985  . AV FISTULA PLACEMENT    . CATARACT EXTRACTION  2010  . CORONARY ARTERY BYPASS GRAFT  1996  . DIALYSIS FISTULA CREATION    . [place]   right  . INTRAMEDULLARY (IM) NAIL INTERTROCHANTERIC Right 08/17/2018   Procedure: INTRAMEDULLARY (IM) NAIL INTERTROCHANTRIC;  Surgeon: Earnestine Leys, MD;  Location: [facility];  Service: Orthopedics;  Laterality: Right;  . PERIPHERAL VASCULAR BALLOON ANGIOPLASTY  02/21/2019   Procedure: PERIPHERAL VASCULAR BALLOON ANGIOPLASTY;  Surgeon: Algernon Huxley, MD;  Location: [place] [facility];  Service: Cardiovascular;;  . PERIPHERAL VASCULAR CATHETERIZATION Left 01/06/2015   Procedure: A/V Shuntogram/Fistulagram;  Surgeon: Katha Cabal, MD;  Location: [place] [facility];  Service: Cardiovascular;  Laterality: Left;  . PERIPHERAL VASCULAR CATHETERIZATION N/A 02/17/2015   Procedure: Dialysis/Perma Catheter Removal;  Surgeon: Katha Cabal,  MD;  Location: [place] [facility];  Service: Cardiovascular;  Laterality: N/A;  . PERIPHERAL VASCULAR CATHETERIZATION N/A 01/28/2016   Procedure: A/V Shuntogram/Fistulagram;  Surgeon: Algernon Huxley, MD;  Location: [place] [facility];  Service: Cardiovascular;  Laterality: N/A;  . PERIPHERAL VASCULAR CATHETERIZATION N/A 01/28/2016   Procedure: A/V Shunt Intervention;  Surgeon: Algernon Huxley, MD;  Location: [place] [facility];  Service: Cardiovascular;  Laterality: N/A;  . TUBAL LIGATION  1973    Prior to Admission medications   Medication Sig Start Date End Date Taking? Authorizing Provider  acetaminophen (TYLENOL) 325 MG  tablet Take 2 tablets (650 mg total) by mouth every 6 (six) hours as needed for mild pain, fever or headache (or Fever >/= 101). 08/21/18   Gladstone Lighter, MD  albuterol (PROVENTIL HFA;VENTOLIN HFA) 108 (90 Base) MCG/ACT inhaler Inhale 2 puffs into the lungs every 6 (six) hours as needed for wheezing or shortness of breath. 08/21/18   [provider]  Amino Acids-Protein Hydrolys (FEEDING SUPPLEMENT, PRO-STAT SUGAR FREE 64,) LIQD Take 30 mLs by mouth 2 (two) times daily. 08/29/18   [provider]  b complex-vitamin c-folic acid (NEPHRO-VITE) 0.8 MG TABS tablet Take 1 tablet by mouth daily.    [provider]  carvedilol (COREG) 25 MG tablet Take 25 mg by mouth 2 (two) times daily.     [provider]  Cholecalciferol (VITAMIN D3) 5000 UNITS TABS Take 5,000 Units by mouth daily.    [provider]  ferrous fumarate-iron polysaccharide complex (TANDEM) 162-115.2 MG CAPS capsule Take 1 capsule by mouth daily.    [provider]  ferrous sulfate 325 (65 FE) MG tablet Take 325 mg by mouth daily with breakfast.    [provider]  FLUoxetine (PROZAC) 20 MG capsule Take 40 mg by mouth daily. 03/06/20   [provider]  FLUoxetine (PROZAC) 40 MG capsule Take 50 mg by mouth daily.     [provider]  Fluoxetine HCl, PMDD, 20 MG TABS fluoxetine 20 mg tablet 09/02/19   [provider]  Fluticasone-Salmeterol (ADVAIR) 100-50 MCG/DOSE AEPB Inhale 1 puff into the lungs 2 (two) times daily. 09/05/18   [provider]  furosemide (LASIX) 20 MG tablet Take 20 mg by mouth once a week. Give on Thursday 08/21/18   [provider]  furosemide (LASIX) 80 MG tablet Give 1 tablet by mouth daily on Sunday, Tuesday and Saturday 08/23/18   [provider]  hydrALAZINE (APRESOLINE) 50 MG tablet Take 50 mg by mouth 2 (two) times daily.  07/10/17   [provider]  hyoscyamine (LEVSIN) 0.125 MG tablet Take  1 tablet (0.125 mg total) by mouth every 4 (four) hours as needed. 12/17/19   Debroah Loop, PA-C  Inf[facility] [employer] [facility] EX) Apply liberal amount topically to area of skin irritation as needed.  OK to leave at bedside 09/01/18   [provider]  ipratropium (ATROVENT) 0.02 % nebulizer solution Take 0.5 mg by nebulization every 6 (six) hours as needed for wheezing or shortness of breath. 09/12/18   [provider]  isosorbide mononitrate (IMDUR) 30 MG 24 hr tablet Take 30 mg by mouth at bedtime.  07/10/17   [provider]  levothyroxine (SYNTHROID, LEVOTHROID) 75 MCG tablet Take 75 mcg by mouth daily.     [provider]  lidocaine-prilocaine (EMLA) cream Apply 1 application topically as needed (for pain).    [provider]  Melatonin 5 MG TABS Take 5  mg by mouth at bedtime.    [provider]  methocarbamol (ROBAXIN) 500 MG tablet Take 500 mg by mouth every 8 (eight) hours as needed for muscle spasms.    [provider]  midodrine (PROAMATINE) 5 MG tablet Take 1 tablet (5 mg total) by mouth 2 (two) times daily with a meal. 08/21/18   Gladstone Lighter, MD  NON FORMULARY Diet Type:  Renal, NAS, NCS, 1200 cc fluid restriction 10/10/18   [provider]  nystatin (MYCOSTATIN/NYSTOP) 100000 UNIT/GM POWD Apply 1 g topically as needed (for irritation).     [provider]  omeprazole (PRILOSEC) 10 MG capsule Take 10 mg by mouth daily.    [provider]  ondansetron (ZOFRAN) 4 MG tablet Take 4 mg by mouth 2 (two) times daily as needed for nausea or vomiting.    [provider]  ondansetron (ZOFRAN-ODT) 4 MG disintegrating tablet Take by mouth. 03/03/20   [provider]  OXYGEN Inhale 2 L/min into the lungs continuous. 08/21/18   [provider]  polyethylene glycol (MIRALAX / GLYCOLAX) 17 g packet Take 17 g by mouth daily. 10/09/19   Earleen Newport, MD  rosuvastatin  (CRESTOR) 40 MG tablet Take 40 mg by mouth every evening.  07/11/17   [provider]  Sennosides-Docusate Sodium (SENNA PLUS) 8.6-50 MG CAPS Take 2 capsules by mouth 2 (two) times daily as needed.     [provider]  telmisartan (MICARDIS) 80 MG tablet Take 80 mg by mouth daily. At night    [provider]  traMADol (ULTRAM) 50 MG tablet Take 50 mg by mouth every 6 (six) hours as needed.    [provider]  traZODone (DESYREL) 100 MG tablet Take 100 mg by mouth at bedtime.     [provider]  triamcinolone (KENALOG) 0.5 % cream Apply 1 application topically as needed (for irritation).     [provider]  UNABLE TO FIND CPAP @@ Bedtime    [provider]    Allergies Ace inhibitors, Amlodipine, Clonidine hydrochloride, Codeine, Latex, Metformin, and Tape  Family History  Problem Relation Age of Onset  . Kidney failure Mother   . Diabetes Mother   . Aortic aneurysm Father   . Coronary artery disease Father   . Heart attack Father   . Diabetes Sister   . Diabetes Brother   . Kidney failure Brother     Social History Social History   Tobacco Use  . Smoking status: Never Smoker  . Smokeless tobacco: Never Used  Vaping Use  . Vaping Use: Never used  Substance Use Topics  . Alcohol use: No  . Drug use: No    Review of Systems  Constitutional: No fever/chills Eyes: No visual changes. ENT: No sore throat. Cardiovascular: Denies chest pain. Respiratory: Positive for shortness of breath. Gastrointestinal: No abdominal pain.  No nausea, no vomiting.  No diarrhea.  No constipation. Genitourinary: Negative for dysuria. Musculoskeletal: Negative for back pain. Skin: Negative for rash. Neurological: Negative for headaches, focal weakness or numbness.  ____________________________________________   PHYSICAL EXAM:  VITAL SIGNS: ED Triage Vitals [12/03/20 1230]  Enc Vitals Group     BP (!) 149/58     Pulse Rate  75     Resp (!) 24     Temp 97.6 F (36.4 C)     Temp Source Oral     SpO2 98 %     Weight      Height 5'  3" (1.6 m)     Head Circumference      Peak Flow      Pain Score 0     Pain Loc      Pain Edu?      Excl. in [place]?     Constitutional: Alert and oriented to person, but not place or time. Eyes: Conjunctivae are normal. Head: Atraumatic. Nose: No congestion/rhinnorhea. Mouth/Throat: Mucous membranes are moist. Neck: Normal ROM Cardiovascular: Normal rate, regular rhythm. Grossly normal heart sounds.  Left forearm AV fistula with palpable thrill. Respiratory: Normal respiratory effort.  No retractions. Lungs CTAB. Gastrointestinal: Soft and nontender. No distention. Genitourinary: deferred Musculoskeletal: No lower extremity tenderness nor edema. Neurologic:  Normal speech and language. No gross focal neurologic deficits are appreciated. Skin:  Skin is warm, dry and intact. No rash noted. Psychiatric: Mood and affect are normal. Speech and behavior are normal.  ____________________________________________   LABS (all labs ordered are listed, but only abnormal results are displayed)  Labs Reviewed  CBC WITH DIFFERENTIAL/PLATELET - Abnormal; Notable for the following components:      Result Value   RBC 2.62 (*)    Hemoglobin 9.4 (*)    HCT 28.0 (*)    MCV 106.9 (*)    MCH 35.9 (*)    Platelets 102 (*)    All other components within normal limits  COMPREHENSIVE METABOLIC PANEL - Abnormal; Notable for the following components:   Glucose, Bld 136 (*)    BUN 29 (*)    Creatinine, Ser 2.95 (*)    Calcium 8.4 (*)    Total Protein 6.3 (*)    Albumin 3.2 (*)    GFR, Estimated 15 (*)    All other components within normal limits  TROPONIN I (HIGH SENSITIVITY)  TROPONIN I (HIGH SENSITIVITY)   ____________________________________________  EKG  ED ECG REPORT I, Blake Divine, the attending physician, personally viewed and interpreted this ECG.   Date: 12/03/2020   EKG Time: 12:32  Rate: 75  Rhythm: normal sinus rhythm  Axis: Normal  Intervals:none  ST&T Change: None   PROCEDURES  Procedure(s) performed (including Critical Care):  Procedures   ____________________________________________   INITIAL IMPRESSION / ASSESSMENT AND PLAN / ED COURSE       85 year old female with past medical history of hypertension, hyperlipidemia, diabetes, CAD status post CABG, COPD on 2 L, and ESRD on HD who presents to the ED for episode of shortness of breath earlier today.  Daughter believes that shortness of breath was related to episode of anxiety and patient currently denies any difficulty breathing.  She is not in any respiratory distress and is maintaining O2 sats on her usual 2 L nasal cannula.  Lungs are clear to auscultation bilaterally and there are no signs of COPD exacerbation or hypervolemia.  EKG shows no evidence of arrhythmia or ischemia and 2 sets of troponin are negative, doubt ACS.  Chest x-ray reviewed by me and shows mild pulmonary vascular congestion without frank edema, not unexpected in this patient with ESRD.  Labs are reassuring, potassium within normal limits.  Patient is appropriate for discharge home with plan to go to her usual dialysis appointment tomorrow and to follow-up with her PCP.  Daughter and patient were counseled to return to the ED for new or worsening symptoms, patient agrees with plan.      ____________________________________________   FINAL CLINICAL IMPRESSION(S) / ED DIAGNOSES  Final diagnoses:  Shortness of breath     ED Discharge Orders  None       Note:  This document was prepared using Dragon voice recognition software and may include unintentional dictation errors.   Blake Divine, MD 12/03/20 [phone]

## 2020-12-03 NOTE — ED Triage Notes (Addendum)
Pt to ER via [facility] from home where caretaker reports increased shortness of breath x2 days and wheezing. Pt on chronic O2 of 2L via [place] and satting 97% at this time. Pt also reports non-radiating chest discomfort in mid chest that also started 2 days ago. Skin warm and dry. Pt in NAD at this time.  Pt dialysis patient MWF, has not missed any sessions. Family report worsening mental status over the past couple of months.   EMS VSS- hr 75 NSR, o2 sats 96% 2L [place], bp 157/58, co2 35, RR 26, cbg 178 temp 98.7 oral

## 2020-12-03 NOTE — ED Notes (Signed)
Pt presents to ED with c/o of having SOB this morning and EMS was called by the caregiver. Daughter with pt at this time and states pt feels better and states there is nothing wrong at this time. Pt presents with a chronic foley, pt denies fevers or chills. Pt denies SOB to this RN at this time. Pt is alert and orientated to person at this time. No BLE swelling noted at this time.

## 2020-12-03 NOTE — ED Notes (Signed)
D/C and reasons to return to ED discussed with pt and daughter, both verbalized understanding. NAD noted.

## 2020-12-10 ENCOUNTER — Other Ambulatory Visit: Payer: Self-pay

## 2020-12-10 ENCOUNTER — Other Ambulatory Visit: Payer: Self-pay | Admitting: Neurology

## 2020-12-10 ENCOUNTER — Ambulatory Visit (INDEPENDENT_AMBULATORY_CARE_PROVIDER_SITE_OTHER): Payer: Medicare Other | Admitting: Physician Assistant

## 2020-12-10 DIAGNOSIS — Z466 Encounter for fitting and adjustment of urinary device: Secondary | ICD-10-CM

## 2020-12-10 DIAGNOSIS — F0390 Unspecified dementia without behavioral disturbance: Secondary | ICD-10-CM

## 2020-12-10 NOTE — Progress Notes (Signed)
Cath Change/ Replacement  Patient is present today for a catheter change due to urinary retention.  34m of water was removed from the balloon, a 16FR foley cath was removed without difficulty.  Patient was cleaned and prepped in a sterile fashion with betadine. A 16 FR foley cath was replaced into the bladder no complications were noted Urine return was noted <561mand urine was yellow in color. The balloon was filled with 2049mf sterile water. A night bag was attached for drainage.  Patient tolerated well, no complications noted.    Performed by: SamDebroah LoopA-C and CryBradly BienenstockMA  Follow up: Return in about 4 weeks (around 01/07/2021) for Catheter exchange.

## 2020-12-16 ENCOUNTER — Telehealth: Payer: Self-pay

## 2020-12-16 NOTE — Telephone Encounter (Signed)
Patient's daughter called stating that patient is at dialysis and they called the her stating that Mrs. Tedder is very upset because her pants are wet.  Daughter states she is not with the patient at this time and it is unclear why she is wet. They are concerned that her catheter may have come out due to the leakage. Patient's caregiver is currently picking up patient and taking her home.  Care giver was contacted to clarify information. [phone] [place] states that she is with Mrs. Yuan and she does not believe her catheter has been pulled out. She has not been able to examine her but the patient's bag is not attached to the catheter and her pants are wet due to urine leakage and no bag attachment. She will attach a new bag when she gets home and examine patient to ensure that catheter is in place. She confirmed that patient is not currently in any pain. She will call the office back to notify us if the catheter has been pulled. Otherwise she will just replace a new bag and confirm urine drainage.   Patient's daughter notified, she states that patient's care giver has updated her and let her know that catheter is in place and she did replace her bag and urine is draining

## 2021-01-04 ENCOUNTER — Other Ambulatory Visit
Admission: RE | Admit: 2021-01-04 | Discharge: 2021-01-04 | Disposition: A | Discharge status: Home / Self Care | Payer: Medicare Other | Source: Ambulatory Visit | Attending: Internal Medicine | Admitting: Internal Medicine

## 2021-01-04 DIAGNOSIS — F0390 Unspecified dementia without behavioral disturbance: Secondary | ICD-10-CM | POA: Insufficient documentation

## 2021-01-05 ENCOUNTER — Ambulatory Visit: Payer: Medicare Other | Attending: Neurology

## 2021-01-05 LAB — T.PALLIDUM AB, TOTAL: T Pallidum Abs: NONREACTIVE

## 2021-01-05 LAB — MISC LABCORP TEST (SEND OUT): Labcorp test code: 82370

## 2021-01-07 ENCOUNTER — Ambulatory Visit (INDEPENDENT_AMBULATORY_CARE_PROVIDER_SITE_OTHER): Payer: Medicare Other | Admitting: Physician Assistant

## 2021-01-07 ENCOUNTER — Other Ambulatory Visit: Payer: Self-pay

## 2021-01-07 DIAGNOSIS — Z466 Encounter for fitting and adjustment of urinary device: Secondary | ICD-10-CM

## 2021-01-07 NOTE — Progress Notes (Signed)
Cath Change/ Replacement  Patient is present today for a catheter change due to urinary retention.  94m of water was removed from the balloon, a 16FR foley cath was removed without difficulty.  Patient was cleaned and prepped in a sterile fashion with betadine. A 16 FR foley cath was replaced into the bladder no complications were noted Urine return was not noted but appropriate Foley placement was confirmed with palpation of the anterior vaginal wall. The balloon was filled with 267mof sterile water. A night bag was attached for drainage.     Performed by: SaDebroah LoopPA-C and CrBradly BienenstockCMA  Follow up: Return in about 4 weeks (around 02/04/2021) for Catheter exchange.

## 2021-02-04 ENCOUNTER — Other Ambulatory Visit: Payer: Self-pay

## 2021-02-04 ENCOUNTER — Ambulatory Visit (INDEPENDENT_AMBULATORY_CARE_PROVIDER_SITE_OTHER): Payer: Medicare Other | Admitting: Physician Assistant

## 2021-02-04 DIAGNOSIS — Z466 Encounter for fitting and adjustment of urinary device: Secondary | ICD-10-CM

## 2021-02-04 NOTE — Progress Notes (Signed)
Cath Change/ Replacement  Patient is present today for a catheter change due to urinary retention.  24m of water was removed from the balloon, a 16FR foley cath was removed without difficulty.  Patient was cleaned and prepped in a sterile fashion with betadine. A 16 FR foley cath was replaced into the bladder no complications were noted Urine return was noted 174mand urine was clear in color. The balloon was filled with 2045mf sterile water. A leg bag was attached for drainage.    Performed by: SamDebroah LoopA-C and CarElberta LeatherwoodMA  Follow up: Return in about 4 weeks (around 03/04/2021) for Catheter exchange.

## 2021-02-26 ENCOUNTER — Ambulatory Visit: Payer: Medicare Other

## 2021-03-02 ENCOUNTER — Other Ambulatory Visit: Payer: Self-pay

## 2021-03-02 ENCOUNTER — Ambulatory Visit
Admission: RE | Admit: 2021-03-02 | Discharge: 2021-03-02 | Disposition: A | Discharge status: Home / Self Care | Payer: Medicare Other | Source: Ambulatory Visit | Attending: Neurology | Admitting: Neurology

## 2021-03-02 DIAGNOSIS — F0390 Unspecified dementia without behavioral disturbance: Secondary | ICD-10-CM | POA: Insufficient documentation

## 2021-03-11 ENCOUNTER — Other Ambulatory Visit: Payer: Self-pay

## 2021-03-11 ENCOUNTER — Ambulatory Visit (INDEPENDENT_AMBULATORY_CARE_PROVIDER_SITE_OTHER): Payer: Medicare Other | Admitting: Physician Assistant

## 2021-03-11 DIAGNOSIS — Z466 Encounter for fitting and adjustment of urinary device: Secondary | ICD-10-CM | POA: Diagnosis not present

## 2021-03-11 NOTE — Progress Notes (Signed)
Cath Change/ Replacement  Patient is present today for a catheter change due to urinary retention.  84m of water was removed from the balloon, a 16FR foley cath was removed without difficulty.  Patient was cleaned and prepped in a sterile fashion with betadine. A 16 FR foley cath was replaced into the bladder no complications were noted Urine return was noted 171mand urine was yellow in color. The balloon was filled with 2068mf sterile water. A night bag was attached for drainage.  Patient tolerated well.    Performed by: SamDebroah LoopA-C and CryBradly BienenstockMA  Follow up: Return in about 4 weeks (around 04/08/2021) for Catheter exchange.

## 2021-04-08 ENCOUNTER — Other Ambulatory Visit: Payer: Self-pay

## 2021-04-08 ENCOUNTER — Ambulatory Visit (INDEPENDENT_AMBULATORY_CARE_PROVIDER_SITE_OTHER): Payer: Medicare Other | Admitting: Physician Assistant

## 2021-04-08 DIAGNOSIS — Z466 Encounter for fitting and adjustment of urinary device: Secondary | ICD-10-CM | POA: Diagnosis not present

## 2021-04-08 NOTE — Progress Notes (Signed)
Cath Change/ Replacement  Patient is present today for a catheter change due to urinary retention.  71m of water was removed from the balloon, a 16FR foley cath was removed without difficulty.  Patient was cleaned and prepped in a sterile fashion with betadine. A 16 FR foley cath was replaced into the bladder no complications were noted Urine return was noted 556mand urine was yellow in color. The balloon was filled with 2074mf sterile water. A night bag was attached for drainage.  Patient tolerated well.    Performed by: SamDebroah LoopA-C   Follow up: Caregiver to call to arrange 1 month catheter change appt

## 2021-05-06 ENCOUNTER — Ambulatory Visit: Payer: Self-pay | Admitting: Physician Assistant

## 2021-08-06 ENCOUNTER — Telehealth: Payer: Self-pay

## 2021-08-06 NOTE — Telephone Encounter (Signed)
Called patient daughter to see how patient has been doing. Patient daughter states hospice was changing patients catheter monthly. Patient daughter states patient is doing so well that hospice will no longer be coming to the house. They are in the process of getting home health. I advised daughter if there is a lapse in care to please contact the clinic for a catheter change.

## 2021-09-21 ENCOUNTER — Other Ambulatory Visit: Payer: Self-pay

## 2021-09-21 ENCOUNTER — Inpatient Hospital Stay
Admission: EM | Admit: 2021-09-21 | Discharge: 2021-09-24 | DRG: 291 | Disposition: A | Discharge status: Home Health | Payer: Medicare Other | Attending: Family Medicine | Admitting: Family Medicine

## 2021-09-21 ENCOUNTER — Emergency Department: Payer: Medicare Other

## 2021-09-21 ENCOUNTER — Encounter: Payer: Self-pay | Admitting: Emergency Medicine

## 2021-09-21 DIAGNOSIS — E1122 Type 2 diabetes mellitus with diabetic chronic kidney disease: Secondary | ICD-10-CM | POA: Diagnosis present

## 2021-09-21 DIAGNOSIS — I132 Hypertensive heart and chronic kidney disease with heart failure and with stage 5 chronic kidney disease, or end stage renal disease: Secondary | ICD-10-CM | POA: Diagnosis present

## 2021-09-21 DIAGNOSIS — E785 Hyperlipidemia, unspecified: Secondary | ICD-10-CM | POA: Diagnosis present

## 2021-09-21 DIAGNOSIS — Z8673 Personal history of transient ischemic attack (TIA), and cerebral infarction without residual deficits: Secondary | ICD-10-CM

## 2021-09-21 DIAGNOSIS — J441 Chronic obstructive pulmonary disease with (acute) exacerbation: Secondary | ICD-10-CM | POA: Diagnosis present

## 2021-09-21 DIAGNOSIS — Z6830 Body mass index (BMI) 30.0-30.9, adult: Secondary | ICD-10-CM | POA: Diagnosis not present

## 2021-09-21 DIAGNOSIS — Z951 Presence of aortocoronary bypass graft: Secondary | ICD-10-CM | POA: Diagnosis not present

## 2021-09-21 DIAGNOSIS — J9621 Acute and chronic respiratory failure with hypoxia: Secondary | ICD-10-CM | POA: Diagnosis present

## 2021-09-21 DIAGNOSIS — E876 Hypokalemia: Secondary | ICD-10-CM | POA: Diagnosis not present

## 2021-09-21 DIAGNOSIS — I2510 Atherosclerotic heart disease of native coronary artery without angina pectoris: Secondary | ICD-10-CM | POA: Diagnosis present

## 2021-09-21 DIAGNOSIS — R778 Other specified abnormalities of plasma proteins: Secondary | ICD-10-CM

## 2021-09-21 DIAGNOSIS — E039 Hypothyroidism, unspecified: Secondary | ICD-10-CM | POA: Diagnosis present

## 2021-09-21 DIAGNOSIS — I10 Essential (primary) hypertension: Secondary | ICD-10-CM | POA: Diagnosis present

## 2021-09-21 DIAGNOSIS — F0390 Unspecified dementia without behavioral disturbance: Secondary | ICD-10-CM | POA: Diagnosis present

## 2021-09-21 DIAGNOSIS — Z841 Family history of disorders of kidney and ureter: Secondary | ICD-10-CM

## 2021-09-21 DIAGNOSIS — T83031A Leakage of indwelling urethral catheter, initial encounter: Secondary | ICD-10-CM | POA: Diagnosis present

## 2021-09-21 DIAGNOSIS — E1151 Type 2 diabetes mellitus with diabetic peripheral angiopathy without gangrene: Secondary | ICD-10-CM | POA: Diagnosis present

## 2021-09-21 DIAGNOSIS — Z8249 Family history of ischemic heart disease and other diseases of the circulatory system: Secondary | ICD-10-CM

## 2021-09-21 DIAGNOSIS — F419 Anxiety disorder, unspecified: Secondary | ICD-10-CM | POA: Diagnosis present

## 2021-09-21 DIAGNOSIS — Z20822 Contact with and (suspected) exposure to covid-19: Secondary | ICD-10-CM | POA: Diagnosis present

## 2021-09-21 DIAGNOSIS — I5033 Acute on chronic diastolic (congestive) heart failure: Secondary | ICD-10-CM | POA: Diagnosis present

## 2021-09-21 DIAGNOSIS — J810 Acute pulmonary edema: Secondary | ICD-10-CM | POA: Diagnosis present

## 2021-09-21 DIAGNOSIS — Z992 Dependence on renal dialysis: Secondary | ICD-10-CM | POA: Diagnosis not present

## 2021-09-21 DIAGNOSIS — J9601 Acute respiratory failure with hypoxia: Secondary | ICD-10-CM

## 2021-09-21 DIAGNOSIS — Z66 Do not resuscitate: Secondary | ICD-10-CM | POA: Diagnosis present

## 2021-09-21 DIAGNOSIS — I701 Atherosclerosis of renal artery: Secondary | ICD-10-CM | POA: Diagnosis present

## 2021-09-21 DIAGNOSIS — D631 Anemia in chronic kidney disease: Secondary | ICD-10-CM | POA: Diagnosis present

## 2021-09-21 DIAGNOSIS — Z79899 Other long term (current) drug therapy: Secondary | ICD-10-CM

## 2021-09-21 DIAGNOSIS — K219 Gastro-esophageal reflux disease without esophagitis: Secondary | ICD-10-CM | POA: Diagnosis present

## 2021-09-21 DIAGNOSIS — Z91040 Latex allergy status: Secondary | ICD-10-CM

## 2021-09-21 DIAGNOSIS — Z888 Allergy status to other drugs, medicaments and biological substances status: Secondary | ICD-10-CM

## 2021-09-21 DIAGNOSIS — I6930 Unspecified sequelae of cerebral infarction: Secondary | ICD-10-CM

## 2021-09-21 DIAGNOSIS — N186 End stage renal disease: Secondary | ICD-10-CM | POA: Diagnosis present

## 2021-09-21 DIAGNOSIS — Z79890 Hormone replacement therapy (postmenopausal): Secondary | ICD-10-CM

## 2021-09-21 DIAGNOSIS — Z833 Family history of diabetes mellitus: Secondary | ICD-10-CM

## 2021-09-21 DIAGNOSIS — Y738 Miscellaneous gastroenterology and urology devices associated with adverse incidents, not elsewhere classified: Secondary | ICD-10-CM | POA: Diagnosis present

## 2021-09-21 DIAGNOSIS — Z9849 Cataract extraction status, unspecified eye: Secondary | ICD-10-CM

## 2021-09-21 DIAGNOSIS — Z91048 Other nonmedicinal substance allergy status: Secondary | ICD-10-CM

## 2021-09-21 DIAGNOSIS — L89152 Pressure ulcer of sacral region, stage 2: Secondary | ICD-10-CM | POA: Diagnosis present

## 2021-09-21 DIAGNOSIS — E662 Morbid (severe) obesity with alveolar hypoventilation: Secondary | ICD-10-CM | POA: Diagnosis present

## 2021-09-21 DIAGNOSIS — F32A Depression, unspecified: Secondary | ICD-10-CM | POA: Diagnosis present

## 2021-09-21 DIAGNOSIS — Z885 Allergy status to narcotic agent status: Secondary | ICD-10-CM

## 2021-09-21 DIAGNOSIS — I25810 Atherosclerosis of coronary artery bypass graft(s) without angina pectoris: Secondary | ICD-10-CM | POA: Diagnosis present

## 2021-09-21 DIAGNOSIS — R7989 Other specified abnormal findings of blood chemistry: Secondary | ICD-10-CM | POA: Diagnosis present

## 2021-09-21 DIAGNOSIS — I252 Old myocardial infarction: Secondary | ICD-10-CM

## 2021-09-21 DIAGNOSIS — E669 Obesity, unspecified: Secondary | ICD-10-CM | POA: Diagnosis present

## 2021-09-21 LAB — CBC WITH DIFFERENTIAL/PLATELET
Abs Immature Granulocytes: 0.06 10*3/uL (ref 0.00–0.07)
Basophils Absolute: 0 10*3/uL (ref 0.0–0.1)
Basophils Relative: 0 %
Eosinophils Absolute: 0.1 10*3/uL (ref 0.0–0.5)
Eosinophils Relative: 2 %
HCT: 34.3 % — ABNORMAL LOW (ref 36.0–46.0)
Hemoglobin: 11.2 g/dL — ABNORMAL LOW (ref 12.0–15.0)
Immature Granulocytes: 1 %
Lymphocytes Relative: 17 %
Lymphs Abs: 1.3 10*3/uL (ref 0.7–4.0)
MCH: 35 pg — ABNORMAL HIGH (ref 26.0–34.0)
MCHC: 32.7 g/dL (ref 30.0–36.0)
MCV: 107.2 fL — ABNORMAL HIGH (ref 80.0–100.0)
Monocytes Absolute: 0.5 10*3/uL (ref 0.1–1.0)
Monocytes Relative: 7 %
Neutro Abs: 5.2 10*3/uL (ref 1.7–7.7)
Neutrophils Relative %: 73 %
Platelets: 134 10*3/uL — ABNORMAL LOW (ref 150–400)
RBC: 3.2 MIL/uL — ABNORMAL LOW (ref 3.87–5.11)
RDW: 13.9 % (ref 11.5–15.5)
WBC: 7.2 10*3/uL (ref 4.0–10.5)
nRBC: 0 % (ref 0.0–0.2)

## 2021-09-21 LAB — HEMOGLOBIN A1C
Hgb A1c MFr Bld: 6.3 % — ABNORMAL HIGH (ref 4.8–5.6)
Mean Plasma Glucose: 134.11 mg/dL

## 2021-09-21 LAB — COMPREHENSIVE METABOLIC PANEL
ALT: 18 U/L (ref 0–44)
AST: 21 U/L (ref 15–41)
Albumin: 3 g/dL — ABNORMAL LOW (ref 3.5–5.0)
Alkaline Phosphatase: 93 U/L (ref 38–126)
Anion gap: 9 (ref 5–15)
BUN: 11 mg/dL (ref 8–23)
CO2: 26 mmol/L (ref 22–32)
Calcium: 8.8 mg/dL — ABNORMAL LOW (ref 8.9–10.3)
Chloride: 102 mmol/L (ref 98–111)
Creatinine, Ser: 2.04 mg/dL — ABNORMAL HIGH (ref 0.44–1.00)
GFR, Estimated: 24 mL/min — ABNORMAL LOW (ref >60)
Glucose, Bld: 170 mg/dL — ABNORMAL HIGH (ref 70–99)
Potassium: 3.9 mmol/L (ref 3.5–5.1)
Sodium: 137 mmol/L (ref 135–145)
Total Bilirubin: 0.8 mg/dL (ref 0.3–1.2)
Total Protein: 6.1 g/dL — ABNORMAL LOW (ref 6.5–8.1)

## 2021-09-21 LAB — TROPONIN I (HIGH SENSITIVITY)
Troponin I (High Sensitivity): 18 ng/L — ABNORMAL HIGH (ref <18)
Troponin I (High Sensitivity): 17 ng/L (ref <18)
Troponin I (High Sensitivity): 17 ng/L (ref <18)

## 2021-09-21 LAB — RESP PANEL BY RT-PCR (FLU A&B, COVID) ARPGX2
Influenza A by PCR: NEGATIVE
Influenza B by PCR: NEGATIVE
SARS Coronavirus 2 by RT PCR: NEGATIVE

## 2021-09-21 LAB — BRAIN NATRIURETIC PEPTIDE: B Natriuretic Peptide: 734 pg/mL — ABNORMAL HIGH (ref 0.0–100.0)

## 2021-09-21 LAB — HEPATITIS B SURFACE ANTIGEN: Hepatitis B Surface Ag: NONREACTIVE

## 2021-09-21 LAB — HEPATITIS B SURFACE ANTIBODY,QUALITATIVE: Hep B S Ab: REACTIVE — AB

## 2021-09-21 MED ORDER — TRIAMCINOLONE ACETONIDE 0.5 % EX CREA
1.0000 "application " | TOPICAL_CREAM | CUTANEOUS | Status: DC | PRN
  Filled 2021-09-21: qty 15

## 2021-09-21 MED ORDER — ALTEPLASE 2 MG IJ SOLR
2.0000 mg | Freq: Once | INTRAMUSCULAR | Status: DC | PRN
  Filled 2021-09-21: qty 2

## 2021-09-21 MED ORDER — FUROSEMIDE 10 MG/ML IJ SOLN
40.0000 mg | Freq: Once | INTRAMUSCULAR | Status: AC
  Administered 2021-09-21: 40 mg via INTRAVENOUS
  Filled 2021-09-21: qty 4

## 2021-09-21 MED ORDER — LIDOCAINE-PRILOCAINE 2.5-2.5 % EX CREA
1.0000 "application " | TOPICAL_CREAM | CUTANEOUS | Status: DC | PRN
  Filled 2021-09-21: qty 5

## 2021-09-21 MED ORDER — CLONAZEPAM 0.25 MG PO TBDP
0.2500 mg | ORAL_TABLET | Freq: Three times a day (TID) | ORAL | Status: DC | PRN
  Administered 2021-09-21: 0.25 mg via ORAL
  Filled 2021-09-21 (×3): qty 1

## 2021-09-21 MED ORDER — ROSUVASTATIN CALCIUM 20 MG PO TABS
40.0000 mg | ORAL_TABLET | Freq: Every evening | ORAL | Status: DC
  Filled 2021-09-21: qty 2

## 2021-09-21 MED ORDER — IRBESARTAN 150 MG PO TABS
150.0000 mg | ORAL_TABLET | Freq: Every day | ORAL | Status: DC
  Administered 2021-09-22 – 2021-09-24 (×3): 150 mg via ORAL
  Filled 2021-09-21 (×4): qty 1

## 2021-09-21 MED ORDER — FLUOXETINE HCL 20 MG PO CAPS
40.0000 mg | ORAL_CAPSULE | Freq: Every day | ORAL | Status: DC
  Administered 2021-09-22 – 2021-09-24 (×3): 40 mg via ORAL
  Filled 2021-09-21 (×3): qty 2

## 2021-09-21 MED ORDER — GERHARDT'S BUTT CREAM
TOPICAL_CREAM | CUTANEOUS | Status: DC
  Administered 2021-09-23: 1 via TOPICAL
  Filled 2021-09-21 (×2): qty 1

## 2021-09-21 MED ORDER — RENA-VITE PO TABS
1.0000 | ORAL_TABLET | Freq: Every day | ORAL | Status: DC
  Filled 2021-09-21: qty 1

## 2021-09-21 MED ORDER — ALBUTEROL SULFATE (2.5 MG/3ML) 0.083% IN NEBU: 2.5000 mg | INHALATION_SOLUTION | RESPIRATORY_TRACT | Status: DC | PRN

## 2021-09-21 MED ORDER — SENNOSIDES-DOCUSATE SODIUM 8.6-50 MG PO CAPS: 2.0000 | ORAL_CAPSULE | Freq: Two times a day (BID) | ORAL | Status: DC | PRN

## 2021-09-21 MED ORDER — SODIUM CHLORIDE 0.9 % IV SOLN: 100.0000 mL | INTRAVENOUS | Status: DC | PRN

## 2021-09-21 MED ORDER — HYDRALAZINE HCL 20 MG/ML IJ SOLN
5.0000 mg | INTRAMUSCULAR | Status: DC | PRN
  Administered 2021-09-22: 5 mg via INTRAVENOUS
  Filled 2021-09-21: qty 1

## 2021-09-21 MED ORDER — METHYLPREDNISOLONE SODIUM SUCC 40 MG IJ SOLR
40.0000 mg | Freq: Two times a day (BID) | INTRAMUSCULAR | Status: DC
  Administered 2021-09-21 – 2021-09-22 (×3): 40 mg via INTRAVENOUS
  Filled 2021-09-21 (×3): qty 1

## 2021-09-21 MED ORDER — HYOSCYAMINE SULFATE 0.125 MG PO TBDP
0.1250 mg | ORAL_TABLET | Freq: Four times a day (QID) | ORAL | Status: DC | PRN
  Filled 2021-09-21: qty 1

## 2021-09-21 MED ORDER — CARVEDILOL 25 MG PO TABS
25.0000 mg | ORAL_TABLET | ORAL | Status: DC
  Administered 2021-09-23: 25 mg via ORAL
  Filled 2021-09-21: qty 1

## 2021-09-21 MED ORDER — METHOCARBAMOL 500 MG PO TABS
500.0000 mg | ORAL_TABLET | Freq: Three times a day (TID) | ORAL | Status: DC | PRN
  Filled 2021-09-21: qty 1

## 2021-09-21 MED ORDER — SENNOSIDES-DOCUSATE SODIUM 8.6-50 MG PO TABS: 2.0000 | ORAL_TABLET | Freq: Two times a day (BID) | ORAL | Status: DC | PRN

## 2021-09-21 MED ORDER — TRAMADOL HCL 50 MG PO TABS
50.0000 mg | ORAL_TABLET | Freq: Four times a day (QID) | ORAL | Status: DC | PRN
  Administered 2021-09-22: 50 mg via ORAL
  Filled 2021-09-21: qty 1

## 2021-09-21 MED ORDER — CHLORHEXIDINE GLUCONATE CLOTH 2 % EX PADS
6.0000 | MEDICATED_PAD | Freq: Every day | CUTANEOUS | Status: DC
  Administered 2021-09-22 – 2021-09-24 (×3): 6 via TOPICAL
  Filled 2021-09-21: qty 6

## 2021-09-21 MED ORDER — FUROSEMIDE 40 MG PO TABS
80.0000 mg | ORAL_TABLET | Freq: Every day | ORAL | Status: DC
  Administered 2021-09-21 – 2021-09-24 (×4): 80 mg via ORAL
  Filled 2021-09-21 (×4): qty 2

## 2021-09-21 MED ORDER — POLYETHYLENE GLYCOL 3350 17 G PO PACK
17.0000 g | PACK | Freq: Every day | ORAL | Status: DC
  Administered 2021-09-22 – 2021-09-24 (×3): 17 g via ORAL
  Filled 2021-09-21 (×3): qty 1

## 2021-09-21 MED ORDER — ISOSORBIDE DINITRATE 10 MG PO TABS
5.0000 mg | ORAL_TABLET | Freq: Every day | ORAL | Status: DC
  Administered 2021-09-21 – 2021-09-23 (×3): 5 mg via ORAL
  Filled 2021-09-21 (×4): qty 0.5

## 2021-09-21 MED ORDER — METHYLPREDNISOLONE SODIUM SUCC 125 MG IJ SOLR
125.0000 mg | Freq: Once | INTRAMUSCULAR | Status: AC
  Administered 2021-09-21: 125 mg via INTRAVENOUS
  Filled 2021-09-21: qty 2

## 2021-09-21 MED ORDER — MELATONIN 5 MG PO TABS
5.0000 mg | ORAL_TABLET | Freq: Every day | ORAL | Status: DC
  Administered 2021-09-21 – 2021-09-23 (×3): 5 mg via ORAL
  Filled 2021-09-21 (×3): qty 1

## 2021-09-21 MED ORDER — CARVEDILOL 25 MG PO TABS
25.0000 mg | ORAL_TABLET | Freq: Every day | ORAL | Status: DC
  Administered 2021-09-21 – 2021-09-23 (×2): 25 mg via ORAL
  Filled 2021-09-21 (×2): qty 1

## 2021-09-21 MED ORDER — LIDOCAINE HCL (PF) 1 % IJ SOLN
5.0000 mL | INTRAMUSCULAR | Status: DC | PRN
  Filled 2021-09-21: qty 5

## 2021-09-21 MED ORDER — CLONAZEPAM 0.25 MG PO TBDP
0.5000 mg | ORAL_TABLET | Freq: Three times a day (TID) | ORAL | Status: DC | PRN
  Administered 2021-09-22 – 2021-09-24 (×4): 0.5 mg via ORAL
  Filled 2021-09-21 (×4): qty 2

## 2021-09-21 MED ORDER — PANTOPRAZOLE SODIUM 40 MG PO TBEC
40.0000 mg | DELAYED_RELEASE_TABLET | Freq: Every day | ORAL | Status: DC
  Filled 2021-09-21: qty 1

## 2021-09-21 MED ORDER — ACETAMINOPHEN 325 MG PO TABS: 650.0000 mg | ORAL_TABLET | Freq: Four times a day (QID) | ORAL | Status: DC | PRN

## 2021-09-21 MED ORDER — LEVOTHYROXINE SODIUM 50 MCG PO TABS
75.0000 ug | ORAL_TABLET | Freq: Every day | ORAL | Status: DC
  Administered 2021-09-22 – 2021-09-24 (×3): 75 ug via ORAL
  Filled 2021-09-21 (×3): qty 1

## 2021-09-21 MED ORDER — CARVEDILOL 25 MG PO TABS: 25.0000 mg | ORAL_TABLET | Freq: Two times a day (BID) | ORAL | Status: DC

## 2021-09-21 MED ORDER — HEPARIN SODIUM (PORCINE) 5000 UNIT/ML IJ SOLN
5000.0000 [IU] | Freq: Three times a day (TID) | INTRAMUSCULAR | Status: DC
  Administered 2021-09-21 – 2021-09-24 (×9): 5000 [IU] via SUBCUTANEOUS
  Filled 2021-09-21 (×9): qty 1

## 2021-09-21 MED ORDER — HEPARIN SODIUM (PORCINE) 1000 UNIT/ML DIALYSIS
1000.0000 [IU] | INTRAMUSCULAR | Status: DC | PRN
  Filled 2021-09-21: qty 1

## 2021-09-21 MED ORDER — DM-GUAIFENESIN ER 30-600 MG PO TB12: 1.0000 | ORAL_TABLET | Freq: Two times a day (BID) | ORAL | Status: DC | PRN

## 2021-09-21 MED ORDER — PENTAFLUOROPROP-TETRAFLUOROETH EX AERO
1.0000 "application " | INHALATION_SPRAY | CUTANEOUS | Status: DC | PRN
  Filled 2021-09-21: qty 30

## 2021-09-21 MED ORDER — FERROUS SULFATE 325 (65 FE) MG PO TABS: 325.0000 mg | ORAL_TABLET | Freq: Every day | ORAL | Status: DC

## 2021-09-21 MED ORDER — IRBESARTAN 150 MG PO TABS
300.0000 mg | ORAL_TABLET | Freq: Every day | ORAL | Status: DC
  Filled 2021-09-21: qty 2

## 2021-09-21 MED ORDER — ASPIRIN EC 81 MG PO TBEC
81.0000 mg | DELAYED_RELEASE_TABLET | Freq: Every day | ORAL | Status: DC
  Administered 2021-09-22 – 2021-09-24 (×3): 81 mg via ORAL
  Filled 2021-09-21 (×4): qty 1

## 2021-09-21 MED ORDER — IPRATROPIUM-ALBUTEROL 0.5-2.5 (3) MG/3ML IN SOLN
3.0000 mL | Freq: Once | RESPIRATORY_TRACT | Status: AC
  Administered 2021-09-21: 3 mL via RESPIRATORY_TRACT
  Filled 2021-09-21: qty 3

## 2021-09-21 MED ORDER — CLONAZEPAM 0.5 MG PO TABS: 0.2500 mg | ORAL_TABLET | Freq: Three times a day (TID) | ORAL | Status: DC | PRN

## 2021-09-21 MED ORDER — VITAMIN D3 25 MCG (1000 UNIT) PO TABS
5000.0000 [IU] | ORAL_TABLET | Freq: Every day | ORAL | Status: DC
  Administered 2021-09-22 – 2021-09-24 (×3): 5000 [IU] via ORAL
  Filled 2021-09-21 (×6): qty 5

## 2021-09-21 MED ORDER — IPRATROPIUM-ALBUTEROL 0.5-2.5 (3) MG/3ML IN SOLN
3.0000 mL | RESPIRATORY_TRACT | Status: DC
  Administered 2021-09-21 – 2021-09-22 (×3): 3 mL via RESPIRATORY_TRACT
  Filled 2021-09-21 (×3): qty 3

## 2021-09-21 MED ORDER — FLUCONAZOLE 100 MG PO TABS
100.0000 mg | ORAL_TABLET | Freq: Every day | ORAL | Status: DC
  Administered 2021-09-21 – 2021-09-24 (×4): 100 mg via ORAL
  Filled 2021-09-21 (×4): qty 1

## 2021-09-21 MED ORDER — ONDANSETRON HCL 4 MG/2ML IJ SOLN: 4.0000 mg | Freq: Three times a day (TID) | INTRAMUSCULAR | Status: DC | PRN

## 2021-09-21 NOTE — H&P (Addendum)
History and Physical    Dawn Bradford WNI:[id number] DOB: [birth year] DOA: 09/21/2021  Referring MD/NP/PA:   PCP: [facility]   Patient coming from:  The patient is coming from home.  At baseline, pt is independent for most of ADL.        Chief Complaint: SOB  HPI: Dawn Bradford is a 86 y.o. female with medical history significant of ESRD-HD (MWF), hypertension, hyperlipidemia, COPD, asthma, stroke, GERD, hypothyroidism, depression, OSA not using CPAP, CAD, CABG, dCHF, aortic valvular disease, anemia, carotid artery stenosis, renal artery stenosis, dementia, who presents with shortness of breath.  Per patient's daughter and caregiver, patient was noted to have shortness of breath and oxygen desaturation to 86% on room air this morning.  Normally patient only use oxygen 2 L at night.  Patient has dry cough and wheezing.  Patient does not have chest pain, fever or chills.  Per caregiver, patient has nausea vomited when she had breakfast. They are concerned that patient may have aspiration.  Patient does not have abdominal pain or diarrhea.  At her normal baseline, patient recognizes family members, but not orientated to time and place.  Her mental status seems to be close to baseline.  ED Course: pt was found to have BNP 734, troponin level 18, negative COVID PCR, potassium 3.9, bicarbonate 26, creatinine 2.04, BUN 11, temperature normal, blood pressure 143/95, heart rate 74, RR 25, oxygen saturation 90-98% on 3 L oxygen.  Chest x-ray showed interstitial edema.  Patient is admitted to progressive bed as inpatient.  Dr. Juleen China of nephrology is consulted    EKG: I have personally reviewed.  Sinus rhythm, QTC 454, Q wave in lead III, nonspecific to obtain  Review of Systems: Could not reviewed accurately due to dementia  Travel history: No recent long distant travel.  Allergy:  Allergies  Allergen Reactions   Ace Inhibitors Other (See Comments)    Reaction:  Unknown     Amlodipine Other (See Comments)    Reaction:  Unknown    Clonidine Hydrochloride Other (See Comments)    Reaction:  Unknown    Codeine Hives   Latex Hives   Metformin Nausea And Vomiting   Tape Rash    Past Medical History:  Diagnosis Date   ACE-inhibitor cough    Aortic valve disorder    Echo in 2009 showed mean aortic valve gradient of 13 mmHg, suggesting very mild stenosis. Echo (12/10) suggested aortic sclerosis only   Carotid artery disease (HCC)    mild, carotid dopplers 12/2009   CHF (congestive heart failure) (HCC)    COPD (chronic obstructive pulmonary disease) (HCC)    Coronary artery disease    s/p anterior MI in 1996 followed by CABG. Lexiscan myoview (4/11): EF 67%, normal perfusion with no evidence for ischemia or infarction.    Diabetes mellitus    type 2   Diastolic heart failure    most recent echo (12/10) showed EF 55-60% with mild LVH, grade I diastolic dysfunction, mild LAE.   ESRD (end stage renal disease) on dialysis ([place])    Hyperlipidemia    Hypertension    resistant hyptertension times many years. the patient does have renal artery stenosis. she has tried calcium channel blockers in the past and states that she would not take them now because she had some problems with her gums which her dentist identified as calcium-channel blocker side effects.   Hypothyroidism    Mild asthma    PFT 02/05/10 FEV1  1.38, FEV1% 73, TLC 3.78 (86%), DLCO 48%, +BD   Myocardial infarction (HCC)    Obesity    Obesity hypoventilation syndrome ([place])        Obstructive sleep apnea    PSG 01/05/2006 AHI 24.8, CPAP 9cm H2O   Pulmonary nodule, right    Renal artery stenosis ([place])    The patient has an occluded right renal artery and an atrophic right kidney. There is 20% left renal artery stenosis. This was seen by catheterization in 2007   Stroke [facility])     Past Surgical History:  Procedure Laterality Date   A/V FISTULAGRAM N/A 01/25/2018   Procedure: A/V FISTULAGRAM;  Surgeon:  Algernon Huxley, MD;  Location: [place] [facility];  Service: Cardiovascular;  Laterality: N/A;   A/V FISTULAGRAM Left 02/21/2019   Procedure: A/V FISTULAGRAM;  Surgeon: Algernon Huxley, MD;  Location: [place] [facility];  Service: Cardiovascular;  Laterality: Left;   A/V FISTULAGRAM Left 10/24/2019   Procedure: A/V FISTULAGRAM;  Surgeon: Algernon Huxley, MD;  Location: [place] [facility];  Service: Cardiovascular;  Laterality: Left;   ABDOMINAL HYSTERECTOMY  1985   AV FISTULA PLACEMENT     CATARACT EXTRACTION  2010   CORONARY ARTERY BYPASS GRAFT  1996   DIALYSIS FISTULA CREATION     INNER EAR SURGERY  1979   right   INTRAMEDULLARY (IM) NAIL INTERTROCHANTERIC Right 08/17/2018   Procedure: INTRAMEDULLARY (IM) NAIL INTERTROCHANTRIC;  Surgeon: Earnestine Leys, MD;  Location: [facility];  Service: Orthopedics;  Laterality: Right;   PERIPHERAL VASCULAR BALLOON ANGIOPLASTY  02/21/2019   Procedure: PERIPHERAL VASCULAR BALLOON ANGIOPLASTY;  Surgeon: Algernon Huxley, MD;  Location: [place] [facility];  Service: Cardiovascular;;   PERIPHERAL VASCULAR CATHETERIZATION Left 01/06/2015   Procedure: A/V Shuntogram/Fistulagram;  Surgeon: Katha Cabal, MD;  Location: [place] [facility];  Service: Cardiovascular;  Laterality: Left;   PERIPHERAL VASCULAR CATHETERIZATION N/A 02/17/2015   Procedure: Dialysis/Perma Catheter Removal;  Surgeon: Katha Cabal, MD;  Location: [place] [facility];  Service: Cardiovascular;  Laterality: N/A;   PERIPHERAL VASCULAR CATHETERIZATION N/A 01/28/2016   Procedure: A/V Shuntogram/Fistulagram;  Surgeon: Algernon Huxley, MD;  Location: [place] [facility];  Service: Cardiovascular;  Laterality: N/A;   PERIPHERAL VASCULAR CATHETERIZATION N/A 01/28/2016   Procedure: A/V Shunt Intervention;  Surgeon: Algernon Huxley, MD;  Location: [place] [facility];  Service: Cardiovascular;  Laterality: N/A;   TUBAL LIGATION  1973    Social History:  reports that she has never smoked. She has never  used smokeless tobacco. She reports that she does not drink alcohol and does not use drugs.  Family History:  Family History  Problem Relation Age of Onset   Kidney failure Mother    Diabetes Mother    Aortic aneurysm Father    Coronary artery disease Father    Heart attack Father    Diabetes Sister    Diabetes Brother    Kidney failure Brother      Prior to Admission medications   Medication Sig Start Date End Date Taking? Authorizing Provider  acetaminophen (TYLENOL) 325 MG tablet Take 2 tablets (650 mg total) by mouth every 6 (six) hours as needed for mild pain, fever or headache (or Fever >/= 101). 08/21/18   Gladstone Lighter, MD  albuterol (PROVENTIL HFA;VENTOLIN HFA) 108 (90 Base) MCG/ACT inhaler Inhale 2 puffs into the lungs every 6 (six) hours as needed for wheezing or shortness of breath. 08/21/18   [provider]  Amino Acids-Protein Hydrolys (FEEDING SUPPLEMENT, PRO-STAT SUGAR FREE 64,) LIQD Take 30 mLs by mouth 2 (two) times daily. 08/29/18   [provider]  b complex-vitamin c-folic acid (NEPHRO-VITE) 0.8 MG TABS tablet Take 1 tablet by mouth daily.    [provider]  carvedilol (COREG) 25 MG tablet Take 25 mg by mouth 2 (two) times daily.     [provider]  Cholecalciferol (VITAMIN D3) 5000 UNITS TABS Take 5,000 Units by mouth daily.    [provider]  ferrous fumarate-iron polysaccharide complex (TANDEM) 162-115.2 MG CAPS capsule Take 1 capsule by mouth daily.    [provider]  ferrous sulfate 325 (65 FE) MG tablet Take 325 mg by mouth daily with breakfast.    [provider]  FLUoxetine (PROZAC) 20 MG capsule Take 40 mg by mouth daily. 03/06/20   [provider]  FLUoxetine (PROZAC) 40 MG capsule Take 50 mg by mouth daily.     [provider]  Fluoxetine HCl, PMDD, 20 MG TABS fluoxetine 20 mg tablet 09/02/19   [provider]  Fluticasone-Salmeterol (ADVAIR) 100-50 MCG/DOSE  AEPB Inhale 1 puff into the lungs 2 (two) times daily. 09/05/18   [provider]  furosemide (LASIX) 20 MG tablet Take 20 mg by mouth once a week. Give on Thursday 08/21/18   [provider]  furosemide (LASIX) 80 MG tablet Give 1 tablet by mouth daily on Sunday, Tuesday and Saturday 08/23/18   [provider]  hydrALAZINE (APRESOLINE) 50 MG tablet Take 50 mg by mouth 2 (two) times daily.  07/10/17   [provider]  hyoscyamine (LEVSIN) 0.125 MG tablet Take 1 tablet (0.125 mg total) by mouth every 4 (four) hours as needed. 12/17/19   Debroah Loop, PA-C  Infant [employer] [facility] EX) Apply liberal amount topically to area of skin irritation as needed.  OK to leave at bedside 09/01/18   [provider]  ipratropium (ATROVENT) 0.02 % nebulizer solution Take 0.5 mg by nebulization every 6 (six) hours as needed for wheezing or shortness of breath. 09/12/18   [provider]  isosorbide mononitrate (IMDUR) 30 MG 24 hr tablet Take 30 mg by mouth at bedtime.  07/10/17   [provider]  levothyroxine (SYNTHROID, LEVOTHROID) 75 MCG tablet Take 75 mcg by mouth daily.     [provider]  lidocaine-prilocaine (EMLA) cream Apply 1 application topically as needed (for pain).    [provider]  Melatonin 5 MG TABS Take 5 mg by mouth at bedtime.    [provider]  methocarbamol (ROBAXIN) 500 MG tablet Take 500 mg by mouth every 8 (eight) hours as needed for muscle spasms.    [provider]  midodrine (PROAMATINE) 5 MG tablet Take 1 tablet (5 mg total) by mouth 2 (two) times daily with a meal. 08/21/18   Gladstone Lighter, MD  NON FORMULARY Diet Type:  Renal, NAS, NCS, 1200 cc fluid restriction 10/10/18   [provider]  nystatin (MYCOSTATIN/NYSTOP) 100000 UNIT/GM POWD Apply 1 g topically as needed (for irritation).     [provider]  omeprazole (PRILOSEC) 10 MG capsule Take 10 mg by  mouth daily.    [provider]  ondansetron (ZOFRAN) 4 MG tablet Take 4 mg by mouth 2 (two) times daily as needed for nausea or vomiting.    [provider]  ondansetron (ZOFRAN-ODT) 4 MG disintegrating tablet Take by mouth. 03/03/20   [provider]  OXYGEN  Inhale 2 L/min into the lungs continuous. 08/21/18   [provider]  polyethylene glycol (MIRALAX / GLYCOLAX) 17 g packet Take 17 g by mouth daily. 10/09/19   Earleen Newport, MD  rosuvastatin (CRESTOR) 40 MG tablet Take 40 mg by mouth every evening.  07/11/17   [provider]  Sennosides-Docusate Sodium (SENNA PLUS) 8.6-50 MG CAPS Take 2 capsules by mouth 2 (two) times daily as needed.     [provider]  telmisartan (MICARDIS) 80 MG tablet Take 80 mg by mouth daily. At night    [provider]  traMADol (ULTRAM) 50 MG tablet Take 50 mg by mouth every 6 (six) hours as needed.    [provider]  traZODone (DESYREL) 100 MG tablet Take 100 mg by mouth at bedtime.     [provider]  triamcinolone (KENALOG) 0.5 % cream Apply 1 application topically as needed (for irritation).     [provider]  UNABLE TO FIND CPAP @@ Bedtime    [provider]    Physical Exam: Vitals:   09/21/21 1130 09/21/21 1200 09/21/21 1230 09/21/21 1400  BP: 133/69 (!) 143/95 (!) 143/69 (!) 169/76  Pulse: 74 66 63 66  Resp: (!) 25 (!) 21 (!) 23 (!) 21  Temp:      TempSrc:      SpO2: 99% 98% 98% 97%  Weight:      Height:       General: Not in acute distress HEENT:       Eyes: PERRL, EOMI, no scleral icterus.       ENT: No discharge from the ears and nose       Neck: positive JVD, no bruit, no mass felt. Heme: No neck lymph node enlargement. Cardiac: S1/S2, RRR, No murmurs, No gallops or rubs. Respiratory: Has wheezing and crackles bilaterally GI: Soft, nondistended, nontender, no organomegaly, BS present. GU: No hematuria Ext: has trace leg edema  bilaterally. 1+DP/PT pulse bilaterally. Musculoskeletal: No joint deformities, No joint redness or warmth, no limitation of ROM in spin. Skin: Has sacral ulcer, stage II       Neuro: Alert, cranial nerves II-XII grossly intact, moves all extremities normally.  Psych: Patient is not psychotic, no suicidal or hemocidal ideation.  Labs on Admission: I have personally reviewed following labs and imaging studies  CBC: Recent Labs  Lab 09/21/21 1126  WBC 7.2  NEUTROABS 5.2  HGB 11.2*  HCT 34.3*  MCV 107.2*  PLT 035*   Basic Metabolic Panel: Recent Labs  Lab 09/21/21 1126  NA 137  K 3.9  CL 102  CO2 26  GLUCOSE 170*  BUN 11  CREATININE 2.04*  CALCIUM 8.8*   GFR: Estimated Creatinine Clearance: 21.5 mL/min (A) (by C-G formula based on SCr of 2.04 mg/dL (H)). Liver Function Tests: Recent Labs  Lab 09/21/21 1126  AST 21  ALT 18  ALKPHOS 93  BILITOT 0.8  PROT 6.1*  ALBUMIN 3.0*   No results for input(s): LIPASE, AMYLASE in the last 168 hours. No results for input(s): AMMONIA in the last 168 hours. Coagulation Profile: No results for input(s): INR, PROTIME in the last 168 hours. Cardiac Enzymes: No results for input(s): CKTOTAL, CKMB, CKMBINDEX, TROPONINI in the last 168 hours. BNP (last 3 results) No results for input(s): PROBNP in the last 8760 hours. HbA1C: No results for input(s): HGBA1C in the last 72 hours. CBG: No results for input(s): GLUCAP in the last 168 hours. Lipid Profile: No results for  input(s): CHOL, HDL, LDLCALC, TRIG, CHOLHDL, LDLDIRECT in the last 72 hours. Thyroid Function Tests: No results for input(s): TSH, T4TOTAL, FREET4, T3FREE, THYROIDAB in the last 72 hours. Anemia Panel: No results for input(s): VITAMINB12, FOLATE, FERRITIN, TIBC, IRON, RETICCTPCT in the last 72 hours. Urine analysis:    Component Value Date/Time   COLORURINE AMBER (A) 10/09/2019 2009   APPEARANCEUR Clear 04/20/2020 1321   LABSPEC 1.011 10/09/2019 2009    PHURINE 6.0 10/09/2019 2009   GLUCOSEU Negative 04/20/2020 1321   HGBUR NEGATIVE 10/09/2019 2009   BILIRUBINUR Negative 04/20/2020 [place] 10/09/2019 2009   PROTEINUR 2+ (A) 04/20/2020 1321   PROTEINUR 100 (A) 10/09/2019 2009   NITRITE Negative 04/20/2020 1321   NITRITE NEGATIVE 10/09/2019 2009   LEUKOCYTESUR Trace (A) 04/20/2020 1321   LEUKOCYTESUR LARGE (A) 10/09/2019 2009   Sepsis Labs: @LABRCNTIP (procalcitonin:4,lacticidven:4) ) Recent Results (from the past 240 hour(s))  Resp Panel by RT-PCR (Flu A&B, Covid) Nasopharyngeal Swab     Status: None   Collection Time: 09/21/21 11:26 AM   Specimen: Nasopharyngeal Swab; Nasopharyngeal(NP) swabs in vial transport medium  Result Value Ref Range Status   SARS Coronavirus 2 by RT PCR NEGATIVE NEGATIVE Final    Comment: (NOTE) SARS-CoV-2 target nucleic acids are NOT DETECTED.  The SARS-CoV-2 RNA is generally detectable in upper respiratory specimens during the acute phase of infection. The lowest concentration of SARS-CoV-2 viral copies this assay can detect is 138 copies/mL. A negative result does not preclude SARS-Cov-2 infection and should not be used as the sole basis for treatment or other patient management decisions. A negative result may occur with  improper specimen collection/handling, submission of specimen other than nasopharyngeal swab, presence of viral mutation(s) within the areas targeted by this assay, and inadequate number of viral copies(<138 copies/mL). A negative result must be combined with clinical observations, patient history, and epidemiological information. The expected result is Negative.  Fact Sheet for Patients:  EntrepreneurPulse.com.au  Fact Sheet for Healthcare Providers:  IncredibleEmployment.be  This test is no t yet approved or cleared by the [place] FDA and  has been authorized for detection and/or diagnosis of SARS-CoV-2 by FDA under  an Emergency Use Authorization (EUA). This EUA will remain  in effect (meaning this test can be used) for the duration of the COVID-19 declaration under Section 564(b)(1) of the Act, 21 U.S.C.section 360bbb-3(b)(1), unless the authorization is terminated  or revoked sooner.       Influenza A by PCR NEGATIVE NEGATIVE Final   Influenza B by PCR NEGATIVE NEGATIVE Final    Comment: (NOTE) The Xpert Xpress SARS-CoV-2/FLU/RSV plus assay is intended as an aid in the diagnosis of influenza from Nasopharyngeal swab specimens and should not be used as a sole basis for treatment. Nasal washings and aspirates are unacceptable for Xpert Xpress SARS-CoV-2/FLU/RSV testing.  Fact Sheet for Patients: EntrepreneurPulse.com.au  Fact Sheet for Healthcare Providers: IncredibleEmployment.be  This test is not yet approved or cleared by the [place] FDA and has been authorized for detection and/or diagnosis of SARS-CoV-2 by FDA under an Emergency Use Authorization (EUA). This EUA will remain in effect (meaning this test can be used) for the duration of the COVID-19 declaration under Section 564(b)(1) of the Act, 21 U.S.C. section 360bbb-3(b)(1), unless the authorization is terminated or revoked.  Performed at [facility], [address] [place], [place] [address]      Radiological Exams on Admission: DG Chest [facility] 1 View  Result Date: 09/21/2021 CLINICAL DATA:  Shortness of breath. EXAM: PORTABLE CHEST 1 VIEW COMPARISON:  12/03/2020 FINDINGS: Prior median sternotomy. Heart size is normal. There is slight increase in interstitial markings throughout the lungs bilaterally, consistent with interstitial edema. More focal opacity at the LEFT lung base likely represents scarring. No discrete consolidations. IMPRESSION: Increased interstitial markings consistent with edema. Electronically Signed   By: Nolon Nations M.D.   On: 09/21/2021 11:45       Assessment/Plan Principal Problem:   Acute on chronic respiratory failure with hypoxia (HCC) Active Problems:   Hyperlipidemia   Chronic depression   Essential hypertension   CAD, ARTERY BYPASS GRAFT   ESRD on dialysis [facility])   Acquired hypothyroidism   Chronic cerebrovascular accident (CVA)   Anemia due to end stage renal disease (HCC)   Acute on chronic diastolic CHF (congestive heart failure) (HCC)   Acute pulmonary edema (HCC)   COPD exacerbation (HCC)   Elevated troponin   Sacral decubitus ulcer, stage II (HCC)   Acute on chronic respiratory failure with hypoxia ([place]): Likely due to combination of COPD exacerbation and acute pulmonary edema/CHF exacerbation.  Patient has wheezing on auscultation, consistent with COPD exacerbation.  Patient has positive JVD, elevated BNP and interstitial pulm edema on chest x-ray, clinically consistent with acute pulmonary edema and CHF exacerbation.  -Admitted to progressive bed as inpatient -Consult renal for dialysis -Bronchodilators -Nasal cannula oxygen to maintain oxygen saturation above 93%  Acute pulmonary edema and acute on chronic diastolic CHF (congestive heart failure): 2D echo on 02/01/2015 showed EF 55 to 60%.  Patient has been compliant to dialysis. -Volume management per renal by dialysis -2d echo -Daily weights -strict I/O's -Low salt diet -Fluid restriction -Obtain REDs Vest reading -continue home lasix   COPD/asthma exacerbation:  -Bronchodilators -Solu-Medrol 40 mg IV bid -Mucinex for cough  -Incentive spirometry -sputum culture -Nasal cannula oxygen as needed to maintain O2 saturation 93% or greater -SLP  Hyperlipidemia: pt is not taking crestor -f/u with PCP  Chronic depression and anxiety -Continue home medications: Klonopin, Prozac,  Essential hypertension -IV hydralazine as needed -Coreg, Lasix, Isordil, irbesartan  CAD, ARTERY BYPASS GRAFT and elevated troponin: Troponin 18, no chest pain,  most likely due to decreased clearance secondary to ESRD -Trend troponin -Check A1c, FLP -Continue Crestor and aspirin  ESRD on dialysis (MWF) -Consulted Dr. Juleen China of renal for dialysis  Acquired hypothyroidism -Synthroid  Chronic cerebrovascular accident (CVA) - aspirin  Anemia due to end stage renal disease ([place]): Hemoglobin stable 11.2 -Follow-up with CBC  Sacral decubitus ulcer, stage II (HCC) -Consulted wound care -add diflucan 100 mg daily       DVT ppx: SQ Heparin  Code Status: DNR per her daughter Family Communication: n  Yes, patient's  daughter  at bed side.  Disposition Plan:  Anticipate discharge back to previous environment Consults called:  Dr. Juleen China of nephrology Admission status and Level of care: Progressive:   as inpt        Status is: Inpatient  Remains inpatient appropriate because: Patient has some multiple comorbidities, including CHF, COPD and ESRD on HD, who presents with acute on chronic respiratory failure with hypoxia due to pulmonary edema, CHF exacerbation and COPD exacerbation.  Her presentation is highly complicated. Patient also has elevated troponin.  Patient is at high risk of deteriorating.  Need to be treated in hospital for the next 2 days.            Date of Service 09/21/2021    Ivor Costa [facility]  If 7PM-7AM, please contact night-coverage [email or web address] 09/21/2021, 5:40 PM

## 2021-09-21 NOTE — ED Provider Notes (Signed)
[facility] Provider Note    Event Date/Time   First MD Initiated Contact with Patient 09/21/21 1118     (approximate)   History   Respiratory Distress   HPI Dawn Bradford is a 86 y.o. female with a past medical history of dementia, CHF and COPD who presents via EMS for shortness of breath and worsening mental status.  Patient is unable to participate in history and review of systems.  Per EMS, they found patient to be at approximately 86% on room air and applied 3 L of nasal cannula which improved her oxygenation into the high 90s.  Patient does not wear any oxygen at baseline or at night.  Patient's caregiver also states that she is concerned patient may have aspirated as she had a coughing fit while eating breakfast this morning and does have a history of aspiration in the past.  Patient did have 1 episode of vomiting during breakfast as well.     Physical Exam   Triage Vital Signs: ED Triage Vitals  Enc Vitals Group     BP --      Pulse --      Resp --      Temp --      Temp src --      SpO2 09/21/21 1116 90 %     Weight 09/21/21 1119 186 lb 3.2 oz (84.5 kg)     Height 09/21/21 1119 5\' 3"  (1.6 m)     Head Circumference --      Peak Flow --      Pain Score 09/21/21 1119 0     Pain Loc --      Pain Edu? --      Excl. in [place]? --     Most recent vital signs: Vitals:   09/21/21 1130 09/21/21 1200  BP: 133/69 (!) 143/95  Pulse: 74 66  Resp: (!) 25 (!) 21  Temp:    SpO2: 99% 98%    General: Awake, no distress.  CV:  Good peripheral perfusion.  Resp:  Increased effort.  Rales over bilateral lower lung fields with wheezing in bilateral upper lung fields Abd:  No distention.  Other:  Overweight elderly Caucasian female on stretcher in mild respiratory distress and nasal cannula in place   ED Results / Procedures / Treatments   Labs (all labs ordered are listed, but only abnormal results are displayed) Labs Reviewed  CBC WITH  DIFFERENTIAL/PLATELET - Abnormal; Notable for the following components:      Result Value   RBC 3.20 (*)    Hemoglobin 11.2 (*)    HCT 34.3 (*)    MCV 107.2 (*)    MCH 35.0 (*)    Platelets 134 (*)    All other components within normal limits  COMPREHENSIVE METABOLIC PANEL - Abnormal; Notable for the following components:   Glucose, Bld 170 (*)    Creatinine, Ser 2.04 (*)    Calcium 8.8 (*)    Total Protein 6.1 (*)    Albumin 3.0 (*)    GFR, Estimated 24 (*)    All other components within normal limits  TROPONIN I (HIGH SENSITIVITY) - Abnormal; Notable for the following components:   Troponin I (High Sensitivity) 18 (*)    All other components within normal limits  RESP PANEL BY RT-PCR (FLU A&B, COVID) ARPGX2  BRAIN NATRIURETIC PEPTIDE     EKG ED ECG REPORT I, Naaman Plummer, the attending physician, personally viewed and  interpreted this ECG.  Date: 09/21/2021 EKG Time: 1124 Rate: 76 Rhythm: normal sinus rhythm QRS Axis: normal Intervals: normal ST/T Wave abnormalities: normal Narrative Interpretation: no evidence of acute ischemia   RADIOLOGY ED MD interpretation: Single view portable chest x-ray shows increased interstitial markings consistent with edema  Official radiology report(s): DG Chest Port 1 View  Result Date: 09/21/2021 CLINICAL DATA:  Shortness of breath. EXAM: PORTABLE CHEST 1 VIEW COMPARISON:  12/03/2020 FINDINGS: Prior median sternotomy. Heart size is normal. There is slight increase in interstitial markings throughout the lungs bilaterally, consistent with interstitial edema. More focal opacity at the LEFT lung base likely represents scarring. No discrete consolidations. IMPRESSION: Increased interstitial markings consistent with edema. Electronically Signed   By: Nolon Nations M.D.   On: 09/21/2021 11:45      PROCEDURES:  Critical Care performed: Yes, see critical care procedure note(s)  .1-3 Lead EKG Interpretation Performed by: Naaman Plummer, MD Authorized by: Naaman Plummer, MD     Interpretation: normal     ECG rate:  67   ECG rate assessment: normal     Rhythm: sinus rhythm     Ectopy: none     Conduction: normal    CRITICAL CARE Performed by: Naaman Plummer   Total critical care time: 31 minutes  Critical care time was exclusive of separately billable procedures and treating other patients.  Critical care was necessary to treat or prevent imminent or life-threatening deterioration.  Critical care was time spent personally by me on the following activities: development of treatment plan with patient and/or surrogate as well as nursing, discussions with consultants, evaluation of patient's response to treatment, examination of patient, obtaining history from patient or surrogate, ordering and performing treatments and interventions, ordering and review of laboratory studies, ordering and review of radiographic studies, pulse oximetry and re-evaluation of patient's condition.   MEDICATIONS ORDERED IN ED: Medications  furosemide (LASIX) injection 40 mg (has no administration in time range)  ipratropium-albuterol (DUONEB) 0.5-2.5 (3) MG/3ML nebulizer solution 3 mL (3 mLs Nebulization Given 09/21/21 1135)  methylPREDNISolone sodium succinate (SOLU-MEDROL) 125 mg/2 mL injection 125 mg (125 mg Intravenous Given 09/21/21 1134)     IMPRESSION / MDM / ASSESSMENT AND PLAN / ED COURSE  I reviewed the triage vital signs and the nursing notes.                              Differential diagnosis includes, but is not limited to, ACS, pneumonia, COPD exacerbation, CHF exacerbation, PE  The patient is on the cardiac monitor to evaluate for evidence of arrhythmia and/or significant heart rate changes.  The patient appears to be suffering from a moderate/severe exacerbation of COPD.  Based on the history, exam, CXR/EKG reviewed by me, and further workup I dont suspect any other emergent cause of this presentation, such as  pneumonia, acute coronary syndrome, congestive heart failure, pulmonary embolism, or pneumothorax.  ED Interventions: bronchodilators, steroids, antibiotics, reassess  Chest x-ray also shows evidence of pulmonary edema likely secondary to patient's concurrent heart failure and will treat with IV Lasix  Reassessment: After treatment, the patients shortness of breath is improving but patient is still requiring supplemental oxygenation   Disposition: Admit     FINAL CLINICAL IMPRESSION(S) / ED DIAGNOSES   Final diagnoses:  Acute respiratory failure with hypoxia (HCC)  COPD exacerbation (HCC)  Acute pulmonary edema ([place])     Rx / DC Orders  ED Discharge Orders     None        Note:  This document was prepared using Dragon voice recognition software and may include unintentional dictation errors.   Naaman Plummer, MD 09/21/21 [phone]

## 2021-09-21 NOTE — ED Notes (Signed)
Niu, MD at bedside. °

## 2021-09-21 NOTE — ED Notes (Addendum)
Patient is resting comfortably. Home care aid and Daughter present in room. Pharmacy Tech in room reviewing medications. Patient will be transported to inpatient when Pharm tech is done.

## 2021-09-21 NOTE — ED Notes (Signed)
DNR band placed on the L arm of patient. Verified and confirmed with daughter current documented code status prior to placement.

## 2021-09-21 NOTE — Progress Notes (Signed)
[facility]  ROUNDING NOTE   Subjective:   Dawn Bradford is an 86 year old Caucasian female with past medical conditions including COPD, GERD, CHF, dementia, end-stage renal disease on hemodialysis.  Patient presents to the emergency department with complaints of shortness of breath and altered mental status from baseline.  Patient has been admitted for Acute on chronic diastolic CHF (congestive heart failure) ([place]) [I50.33] Acute on chronic respiratory failure with hypoxia ([place]) [J96.21]  Patient is known to our clinic and receives outpatient dialysis at [facility] on a MWF schedule supervised by Dr. Candiss Norse.  Patient did not receive full treatment yesterday.  Patient has advanced dementia and due to mental status unable to participate effectively during visit.  Patient's daughter and personal CNA at bedside.  According to caregiver, patient was short of breath this morning while completing her morning routine.  Caregiver and daughter both agree patient's mental status has deteriorated over the past day.  Caregiver notes patient having trouble with oral nutrition, daughter states she has had to maintain a soft diet over the past few days due to difficulties swallowing.  Chart review indicates 1 episode of vomiting during breakfast.  Patient currently eating lunch and seen coughing and spitting out ground meat.  Appears slightly short of breath with conversation.  Continues to remove oxygen from nose.  Current labs unremarkable for renal patient.  Chest x-ray shows increased interstitial markings consistent with edema.  We have been consulted to monitor and manage dialysis needs during this admission  Objective:  Vital signs in last 24 hours:  Temp:  [98.3 F (36.8 C)] 98.3 F (36.8 C) (01/17 1122) Pulse Rate:  [63-74] 63 (01/17 1230) Resp:  [21-25] 23 (01/17 1230) BP: (133-171)/(59-95) 143/69 (01/17 1230) SpO2:  [90 %-100 %] 98 % (01/17 1230) Weight:  [84.5 kg] 84.5 kg  (01/17 1119)  Weight change:  Filed Weights   09/21/21 1119  Weight: 84.5 kg    Intake/Output: No intake/output data recorded.   Intake/Output this shift:  No intake/output data recorded.  Physical Exam: General: NAD, resting on stretcher  Head: Normocephalic, atraumatic. Moist oral mucosal membranes  Eyes: Anicteric  Lungs:  Wheezing, normal effort, 2L [place]  Heart: Regular rate and rhythm  Abdomen:  Soft, nontender  Extremities: Trace peripheral edema.  Neurologic: Nonfocal, moving all four extremities  Skin: No lesions  Access: Left aVF    Basic Metabolic Panel: Recent Labs  Lab 09/21/21 1126  NA 137  K 3.9  CL 102  CO2 26  GLUCOSE 170*  BUN 11  CREATININE 2.04*  CALCIUM 8.8*    Liver Function Tests: Recent Labs  Lab 09/21/21 1126  AST 21  ALT 18  ALKPHOS 93  BILITOT 0.8  PROT 6.1*  ALBUMIN 3.0*   No results for input(s): LIPASE, AMYLASE in the last 168 hours. No results for input(s): AMMONIA in the last 168 hours.  CBC: Recent Labs  Lab 09/21/21 1126  WBC 7.2  NEUTROABS 5.2  HGB 11.2*  HCT 34.3*  MCV 107.2*  PLT 134*    Cardiac Enzymes: No results for input(s): CKTOTAL, CKMB, CKMBINDEX, TROPONINI in the last 168 hours.  BNP: Invalid input(s): POCBNP  CBG: No results for input(s): GLUCAP in the last 168 hours.  Microbiology: Results for orders placed or performed during the hospital encounter of 09/21/21  Resp Panel by RT-PCR (Flu A&B, Covid) Nasopharyngeal Swab     Status: None   Collection Time: 09/21/21 11:26 AM   Specimen: Nasopharyngeal Swab;  Nasopharyngeal(NP) swabs in vial transport medium  Result Value Ref Range Status   SARS Coronavirus 2 by RT PCR NEGATIVE NEGATIVE Final    Comment: (NOTE) SARS-CoV-2 target nucleic acids are NOT DETECTED.  The SARS-CoV-2 RNA is generally detectable in upper respiratory specimens during the acute phase of infection. The lowest concentration of SARS-CoV-2 viral copies this assay can  detect is 138 copies/mL. A negative result does not preclude SARS-Cov-2 infection and should not be used as the sole basis for treatment or other patient management decisions. A negative result may occur with  improper specimen collection/handling, submission of specimen other than nasopharyngeal swab, presence of viral mutation(s) within the areas targeted by this assay, and inadequate number of viral copies(<138 copies/mL). A negative result must be combined with clinical observations, patient history, and epidemiological information. The expected result is Negative.  Fact Sheet for Patients:  EntrepreneurPulse.com.au  Fact Sheet for Healthcare Providers:  IncredibleEmployment.be  This test is no t yet approved or cleared by the [place] FDA and  has been authorized for detection and/or diagnosis of SARS-CoV-2 by FDA under an Emergency Use Authorization (EUA). This EUA will remain  in effect (meaning this test can be used) for the duration of the COVID-19 declaration under Section 564(b)(1) of the Act, 21 U.S.C.section 360bbb-3(b)(1), unless the authorization is terminated  or revoked sooner.       Influenza A by PCR NEGATIVE NEGATIVE Final   Influenza B by PCR NEGATIVE NEGATIVE Final    Comment: (NOTE) The Xpert Xpress SARS-CoV-2/FLU/RSV plus assay is intended as an aid in the diagnosis of influenza from Nasopharyngeal swab specimens and should not be used as a sole basis for treatment. Nasal washings and aspirates are unacceptable for Xpert Xpress SARS-CoV-2/FLU/RSV testing.  Fact Sheet for Patients: EntrepreneurPulse.com.au  Fact Sheet for Healthcare Providers: IncredibleEmployment.be  This test is not yet approved or cleared by the [place] FDA and has been authorized for detection and/or diagnosis of SARS-CoV-2 by FDA under an Emergency Use Authorization (EUA). This EUA will remain in  effect (meaning this test can be used) for the duration of the COVID-19 declaration under Section 564(b)(1) of the Act, 21 U.S.C. section 360bbb-3(b)(1), unless the authorization is terminated or revoked.  Performed at [facility], [address] [place], [place] [address]     Coagulation Studies: No results for input(s): LABPROT, INR in the last 72 hours.  Urinalysis: No results for input(s): COLORURINE, LABSPEC, PHURINE, GLUCOSEU, HGBUR, BILIRUBINUR, KETONESUR, PROTEINUR, UROBILINOGEN, NITRITE, LEUKOCYTESUR in the last 72 hours.  Invalid input(s): APPERANCEUR    Imaging: DG Chest Port 1 View  Result Date: 09/21/2021 CLINICAL DATA:  Shortness of breath. EXAM: PORTABLE CHEST 1 VIEW COMPARISON:  12/03/2020 FINDINGS: Prior median sternotomy. Heart size is normal. There is slight increase in interstitial markings throughout the lungs bilaterally, consistent with interstitial edema. More focal opacity at the LEFT lung base likely represents scarring. No discrete consolidations. IMPRESSION: Increased interstitial markings consistent with edema. Electronically Signed   By: Nolon Nations M.D.   On: 09/21/2021 11:45     Medications:     heparin  5,000 Units Subcutaneous Q8H   ipratropium-albuterol  3 mL Nebulization Q4H   methylPREDNISolone sodium succinate  40 mg Intravenous Q12H   acetaminophen, albuterol, dextromethorphan-guaiFENesin, hydrALAZINE, ondansetron (ZOFRAN) IV  Assessment/ Plan:  Ms. GRACELYNN BIRCHER is a 85 y.o.  female  past medical conditions including COPD, GERD, CHF, dementia, end-stage renal disease on hemodialysis.  Patient presents to the emergency  department with complaints of shortness of breath and altered mental status from baseline.  Patient has been admitted for Acute on chronic diastolic CHF (congestive heart failure) (HCC) [I50.33] Acute on chronic respiratory failure with hypoxia (HCC) [J96.21]  [facility] [place]/MWF/left aVF/80.5  kg  End-stage renal disease on hemodialysis.  Last treatment received yesterday.  We will maintain outpatient schedule if possible.  Do not feel patient requires extra dialysis treatment today.  Next treatment scheduled for Wednesday.  2. Anemia of chronic kidney disease Lab Results  Component Value Date   HGB 11.2 (L) 09/21/2021    Hemoglobin within acceptable range.  3. Secondary Hyperparathyroidism:  Lab Results  Component Value Date   PTH 59 08/13/2018   CALCIUM 8.8 (L) 09/21/2021   PHOS 3.6 08/17/2018    Calcium and phosphorus within acceptable range.  We will continue to monitor     LOS: 0   1/17/20232:34 PM

## 2021-09-21 NOTE — ED Triage Notes (Signed)
Patient to ED via [facility] from home for resp distress/cough. Patient was being seen by home health for first visit- lives at home alone. Hx of dementia. Wheezing audible- placed on 3L by EMS.

## 2021-09-21 NOTE — ED Notes (Signed)
Lab called at this time to add on troponin.

## 2021-09-21 NOTE — ED Notes (Signed)
At home caregiver at bedside with patient.

## 2021-09-21 NOTE — Consult Note (Signed)
WOC Nurse Consult Note: Reason for Consult: Irritant contact dermatitis due to incontinence of urine, stool or dual incontinence.  Erythema intertrigo at gluteal cleft.  ICD-10 CM Codes for Irritant Dermatitis  L24A2 - Due to fecal, urinary or dual incontinence L30.4  - Erythema intertrigo. Also used for abrasion of the hand, chafing of the skin, dermatitis due to sweating and friction, friction dermatitis, friction eczema, and genital/thigh intertrigo.   Wound type:Moisture associated skin damage with partial thickness skin loss. See photos taken earlier today and uploaded to the EMR by Dr. Blaine Hamper. Pressure Injury POA: Yes Measurement: Not measured today, small pinpoint partial thickness areas of skin loss in gluteal cleft, buttocks, perineal area and at medial thighs Wound NGE:XBMW, moist Drainage (amount, consistency, odor) scant serous Periwound: with flaking at periphery and faint satellite lesions consistent with presentation of fungal overgrowth  Dressing procedure/placement/frequency: I will provide the patient with a mattress replacement with low air loss feature, also provide guidance for Nursing to turn and reposition from side to side and minimize time in the supine position.  The patient is not top wear disposable briefs while in house, but rather have timely incontinence care provided and be placed on our antimicrobial, low friction coefficient (CoF) underpads. Topical care will be with Gerhart's Butt Cream, a compounded 1:1:1 preparation consisting of zinc oxide:hydrocortisone cream:lotrimin cream and applied in a thin layer every 4 hours and PRN soiling. I have communicated my recommendations to the POC to Dr. Blaine Hamper and suggested that the Gerhart's Butt Cream might replace the Kenalog cream previously ordered.  Additionally, I have recommended a few doses of systemic antifungal (e.g., Diflucan) be considered unless contraindicated.  Dr. Blaine Hamper and I have corresponded via [facility] and he  is in agreement with the POC recommended.  [place] nursing team will not follow, but will remain available to this patient, the nursing and medical teams.  Please re-consult if needed. Thanks, Maudie Flakes, MSN, RN, [place], [facility]  Pager# [phone]

## 2021-09-21 NOTE — ED Notes (Signed)
Dialysis consent-paper copy signed by patient's daughter.

## 2021-09-21 NOTE — ED Notes (Signed)
Patient sleeping at this time, NAD noted. Daughter at bedside.

## 2021-09-21 NOTE — ED Notes (Signed)
Patient anxious in room with daughter and home health CNA. Patient repositioned in bed.  Message sent to Blaine Hamper, MD to get medication for anxiety and for cream to place on pressure sore on buttocks.

## 2021-09-22 ENCOUNTER — Inpatient Hospital Stay
Admit: 2021-09-22 | Discharge: 2021-09-22 | Disposition: A | Discharge status: Home / Self Care | Payer: Medicare Other | Attending: Internal Medicine | Admitting: Internal Medicine

## 2021-09-22 LAB — LIPID PANEL
Cholesterol: 200 mg/dL (ref 0–200)
HDL: 51 mg/dL (ref >40)
LDL Cholesterol: 138 mg/dL — ABNORMAL HIGH (ref 0–99)
Total CHOL/HDL Ratio: 3.9 RATIO
Triglycerides: 57 mg/dL (ref <150)
VLDL: 11 mg/dL (ref 0–40)

## 2021-09-22 LAB — BASIC METABOLIC PANEL
Anion gap: 9 (ref 5–15)
BUN: 19 mg/dL (ref 8–23)
CO2: 27 mmol/L (ref 22–32)
Calcium: 8.7 mg/dL — ABNORMAL LOW (ref 8.9–10.3)
Chloride: 98 mmol/L (ref 98–111)
Creatinine, Ser: 2.53 mg/dL — ABNORMAL HIGH (ref 0.44–1.00)
GFR, Estimated: 18 mL/min — ABNORMAL LOW (ref >60)
Glucose, Bld: 193 mg/dL — ABNORMAL HIGH (ref 70–99)
Potassium: 3.3 mmol/L — ABNORMAL LOW (ref 3.5–5.1)
Sodium: 134 mmol/L — ABNORMAL LOW (ref 135–145)

## 2021-09-22 LAB — ECHOCARDIOGRAM COMPLETE
AR max vel: 1.72 cm2
AV Area VTI: 1.88 cm2
AV Area mean vel: 1.79 cm2
AV Mean grad: 7 mmHg
AV Peak grad: 14.1 mmHg
Ao pk vel: 1.88 m/s
Area-P 1/2: 3 cm2
Height: 63 in
MV VTI: 1.63 cm2
S' Lateral: 2.62 cm
Weight: 2754.87 oz

## 2021-09-22 LAB — HEMOGLOBIN A1C
Hgb A1c MFr Bld: 6.2 % — ABNORMAL HIGH (ref 4.8–5.6)
Mean Plasma Glucose: 131.24 mg/dL

## 2021-09-22 LAB — GLUCOSE, CAPILLARY: Glucose-Capillary: 182 mg/dL — ABNORMAL HIGH (ref 70–99)

## 2021-09-22 LAB — PHOSPHORUS: Phosphorus: 3.6 mg/dL (ref 2.5–4.6)

## 2021-09-22 LAB — HEPATITIS B SURFACE ANTIBODY, QUANTITATIVE: Hep B S AB Quant (Post): 76.9 m[IU]/mL (ref >9.9)

## 2021-09-22 MED ORDER — IPRATROPIUM-ALBUTEROL 0.5-2.5 (3) MG/3ML IN SOLN
3.0000 mL | Freq: Three times a day (TID) | RESPIRATORY_TRACT | Status: DC
  Administered 2021-09-22 – 2021-09-23 (×2): 3 mL via RESPIRATORY_TRACT
  Filled 2021-09-22 (×2): qty 3

## 2021-09-22 MED ORDER — IPRATROPIUM-ALBUTEROL 0.5-2.5 (3) MG/3ML IN SOLN
3.0000 mL | Freq: Four times a day (QID) | RESPIRATORY_TRACT | Status: DC
  Administered 2021-09-22 (×2): 3 mL via RESPIRATORY_TRACT
  Filled 2021-09-22 (×2): qty 3

## 2021-09-22 MED ORDER — INSULIN ASPART 100 UNIT/ML IJ SOLN
0.0000 [IU] | Freq: Three times a day (TID) | INTRAMUSCULAR | Status: DC
  Administered 2021-09-23: 1 [IU] via SUBCUTANEOUS
  Administered 2021-09-23 – 2021-09-24 (×4): 2 [IU] via SUBCUTANEOUS
  Filled 2021-09-22 (×5): qty 1

## 2021-09-22 MED ORDER — INSULIN ASPART 100 UNIT/ML IJ SOLN: 0.0000 [IU] | Freq: Every day | INTRAMUSCULAR | Status: DC

## 2021-09-22 NOTE — Progress Notes (Signed)
This patient has A/V fistula in left forearm. Noted to have PIV in left hand this morning during assessment. This IV was placed yesterday, 09/21/2021 at 1129. Patient is confused and agitated this morning. Family does not want me the remove this IV at this time.

## 2021-09-22 NOTE — Progress Notes (Signed)
PROGRESS NOTE  Dawn Bradford IOM:[id number] DOB: Aug 18, 1936 DOA: 09/21/2021 PCP: [facility]  HPI/Recap of past 24 hours: Dawn Bradford is a 86 y.o. female with medical history significant of ESRD-HD (MWF), hypertension, hyperlipidemia, COPD, asthma, stroke, GERD, hypothyroidism, chronic depression/anxiety, OSA not using CPAP, CAD, CABG, dCHF, aortic valvular disease, anemia, carotid artery stenosis, renal artery stenosis, dementia, who presents with shortness of breath and hypoxia with O2 saturation of 86% on room air.  Also concern for possible aspiration when she vomited her breakfast the morning prior to her admission.  At baseline, patient recognizes family members, but not orientated to time and place.    Work-up reviewed asthma/COPD exacerbation and acute on chronic diastolic CHF.  She was started on IV steroids, bronchodilators, p.o. Lasix 80 mg daily, cardiac medications were resumed.  09/22/2021: Patient was seen and examined at bedside.  Very anxious and confused on exam.  Unable to provide a reliable history due to confusion.    Assessment/Plan: Principal Problem:   Acute on chronic respiratory failure with hypoxia (HCC) Active Problems:   Hyperlipidemia   Chronic depression   Essential hypertension   CAD, ARTERY BYPASS GRAFT   ESRD on dialysis [facility])   Acquired hypothyroidism   Chronic cerebrovascular accident (CVA)   Anemia due to end stage renal disease (HCC)   Acute on chronic diastolic CHF (congestive heart failure) (HCC)   Acute pulmonary edema (HCC)   COPD exacerbation (HCC)   Elevated troponin   Sacral decubitus ulcer, stage II (HCC)  Acute on chronic respiratory failure with hypoxia ([place]): Likely due to combination of COPD exacerbation and acute pulmonary edema/CHF exacerbation.  Patient has wheezing on auscultation, consistent with COPD exacerbation.  Patient has positive JVD, elevated BNP and interstitial pulm edema on chest x-ray, clinically  consistent with acute pulmonary edema and CHF exacerbation. Personally reviewed chest x-ray done on admission which shows bilateral increasing pulmonary vascularity suggestive of pulm edema. Continue bronchodilators, diuretic, IV steroids. Continue to maintain O2 saturation greater than 92%.   Acute pulmonary edema and acute on chronic diastolic CHF (congestive heart failure): 2D echo on 02/01/2015 showed EF 55 to 60%.  Patient has been compliant to dialysis. -Volume management per renal by dialysis -2d echo -Daily weights -strict I/O's -Low salt diet -Fluid restriction -continue home lasix   COPD/asthma exacerbation:  -Continue bronchodilators -Continue Solu-Medrol 40 mg IV bid -Mucinex for cough  -Incentive spirometry -sputum culture -Nasal cannula oxygen as needed to maintain O2 saturation 93% or greater -SLP   Hyperlipidemia: pt is not taking crestor -f/u with PCP   Chronic depression and anxiety -Continue home medications.: Klonopin, Prozac,   Essential hypertension -IV hydralazine as needed -Coreg, Lasix, Isordil, irbesartan   CAD, ARTERY BYPASS GRAFT and elevated troponin: Troponin 18, no chest pain, most likely due to decreased clearance secondary to ESRD -Trend troponin -Check A1c, FLP -Continue Crestor and aspirin   ESRD on dialysis (MWF) -Consulted Dr. Juleen China of renal for dialysis Volume status management per nephrology.   Acquired hypothyroidism -Synthroid   Chronic cerebrovascular accident (CVA) - aspirin   Anemia due to end stage renal disease [facility]): Hemoglobin stable 11.2 -Follow-up with CBC   Sacral decubitus ulcer, stage II ([place]) -Consulted wound care -add diflucan 100 mg daily   Hypokalemia Last improvement for Repleted with dialysis.         DVT ppx: SQ Heparin  Code Status: DNR per her daughter Family Communication: None at bedside. Disposition Plan:  Anticipate discharge back  to previous environment Consults called:  Dr. Juleen China  of nephrology Admission status and Level of care: Progressive:   as inpt    Inpatient status.  Patient requires at least 2 midnights for further evaluation and treatment of present condition.  Objective: Vitals:   09/22/21 0540 09/22/21 0619 09/22/21 0750 09/22/21 0815  BP: (!) 171/59 (!) 160/50  (!) 132/50  Pulse: 72   84  Resp: 18   (!) 22  Temp: 98.3 F (36.8 C)   97.6 F (36.4 C)  TempSrc: Oral   Oral  SpO2: 99%  96% 98%  Weight:      Height:        Intake/Output Summary (Last 24 hours) at 09/22/2021 1100 Last data filed at 09/22/2021 0900 Gross per 24 hour  Intake 120 ml  Output 1450 ml  Net -1330 ml   Filed Weights   09/21/21 1119 09/21/21 2212 09/22/21 0500  Weight: 84.5 kg 79.3 kg 78.1 kg    Exam:  General: 86 y.o. year-old female well developed well nourished in no acute distress.  Alert and interactive. Cardiovascular: Regular rate and rhythm with no rubs or gallops.  No thyromegaly or JVD noted.   Respiratory: Mild diffuse rales bilaterally.  Mild wheezes also noted.  Poor inspiratory effort. Abdomen: Soft nontender nondistended with normal bowel sounds x4 quadrants. Musculoskeletal: Trace lower extremity edema bilaterally.   Skin: No ulcerative lesions noted or rashes, Psychiatry: Mood is anxious.   Data Reviewed: CBC: Recent Labs  Lab 09/21/21 1126  WBC 7.2  NEUTROABS 5.2  HGB 11.2*  HCT 34.3*  MCV 107.2*  PLT 032*   Basic Metabolic Panel: Recent Labs  Lab 09/21/21 1126 09/22/21 0713  NA 137 134*  K 3.9 3.3*  CL 102 98  CO2 26 27  GLUCOSE 170* 193*  BUN 11 19  CREATININE 2.04* 2.53*  CALCIUM 8.8* 8.7*   GFR: Estimated Creatinine Clearance: 16.7 mL/min (A) (by C-G formula based on SCr of 2.53 mg/dL (H)). Liver Function Tests: Recent Labs  Lab 09/21/21 1126  AST 21  ALT 18  ALKPHOS 93  BILITOT 0.8  PROT 6.1*  ALBUMIN 3.0*   No results for input(s): LIPASE, AMYLASE in the last 168 hours. No results for input(s): AMMONIA in the  last 168 hours. Coagulation Profile: No results for input(s): INR, PROTIME in the last 168 hours. Cardiac Enzymes: No results for input(s): CKTOTAL, CKMB, CKMBINDEX, TROPONINI in the last 168 hours. BNP (last 3 results) No results for input(s): PROBNP in the last 8760 hours. HbA1C: Recent Labs    09/21/21 1515  HGBA1C 6.3*   CBG: No results for input(s): GLUCAP in the last 168 hours. Lipid Profile: Recent Labs    09/22/21 0713  CHOL 200  HDL 51  LDLCALC 138*  TRIG 57  CHOLHDL 3.9   Thyroid Function Tests: No results for input(s): TSH, T4TOTAL, FREET4, T3FREE, THYROIDAB in the last 72 hours. Anemia Panel: No results for input(s): VITAMINB12, FOLATE, FERRITIN, TIBC, IRON, RETICCTPCT in the last 72 hours. Urine analysis:    Component Value Date/Time   COLORURINE AMBER (A) 10/09/2019 2009   APPEARANCEUR Clear 04/20/2020 1321   LABSPEC 1.011 10/09/2019 2009   PHURINE 6.0 10/09/2019 2009   GLUCOSEU Negative 04/20/2020 1321   HGBUR NEGATIVE 10/09/2019 2009   BILIRUBINUR Negative 04/20/2020 1321   KETONESUR NEGATIVE 10/09/2019 2009   PROTEINUR 2+ (A) 04/20/2020 1321   PROTEINUR 100 (A) 10/09/2019 2009   NITRITE Negative 04/20/2020 1321   NITRITE NEGATIVE  10/09/2019 2009   LEUKOCYTESUR Trace (A) 04/20/2020 1321   LEUKOCYTESUR LARGE (A) 10/09/2019 2009   Sepsis Labs: @LABRCNTIP (procalcitonin:4,lacticidven:4)  ) Recent Results (from the past 240 hour(s))  Resp Panel by RT-PCR (Flu A&B, Covid) Nasopharyngeal Swab     Status: None   Collection Time: 09/21/21 11:26 AM   Specimen: Nasopharyngeal Swab; Nasopharyngeal(NP) swabs in vial transport medium  Result Value Ref Range Status   SARS Coronavirus 2 by RT PCR NEGATIVE NEGATIVE Final    Comment: (NOTE) SARS-CoV-2 target nucleic acids are NOT DETECTED.  The SARS-CoV-2 RNA is generally detectable in upper respiratory specimens during the acute phase of infection. The lowest concentration of SARS-CoV-2 viral copies this  assay can detect is 138 copies/mL. A negative result does not preclude SARS-Cov-2 infection and should not be used as the sole basis for treatment or other patient management decisions. A negative result may occur with  improper specimen collection/handling, submission of specimen other than nasopharyngeal swab, presence of viral mutation(s) within the areas targeted by this assay, and inadequate number of viral copies(<138 copies/mL). A negative result must be combined with clinical observations, patient history, and epidemiological information. The expected result is Negative.  Fact Sheet for Patients:  EntrepreneurPulse.com.au  Fact Sheet for Healthcare Providers:  IncredibleEmployment.be  This test is no t yet approved or cleared by the [place] FDA and  has been authorized for detection and/or diagnosis of SARS-CoV-2 by FDA under an Emergency Use Authorization (EUA). This EUA will remain  in effect (meaning this test can be used) for the duration of the COVID-19 declaration under Section 564(b)(1) of the Act, 21 U.S.C.section 360bbb-3(b)(1), unless the authorization is terminated  or revoked sooner.       Influenza A by PCR NEGATIVE NEGATIVE Final   Influenza B by PCR NEGATIVE NEGATIVE Final    Comment: (NOTE) The Xpert Xpress SARS-CoV-2/FLU/RSV plus assay is intended as an aid in the diagnosis of influenza from Nasopharyngeal swab specimens and should not be used as a sole basis for treatment. Nasal washings and aspirates are unacceptable for Xpert Xpress SARS-CoV-2/FLU/RSV testing.  Fact Sheet for Patients: EntrepreneurPulse.com.au  Fact Sheet for Healthcare Providers: IncredibleEmployment.be  This test is not yet approved or cleared by the [place] FDA and has been authorized for detection and/or diagnosis of SARS-CoV-2 by FDA under an Emergency Use Authorization (EUA). This EUA will  remain in effect (meaning this test can be used) for the duration of the COVID-19 declaration under Section 564(b)(1) of the Act, 21 U.S.C. section 360bbb-3(b)(1), unless the authorization is terminated or revoked.  Performed at [facility], [address] [place], [place] [address]       Studies: [facility] Chest S[place] 1 View  Result Date: 09/21/2021 CLINICAL DATA:  Shortness of breath. EXAM: PORTABLE CHEST 1 VIEW COMPARISON:  12/03/2020 FINDINGS: Prior median sternotomy. Heart size is normal. There is slight increase in interstitial markings throughout the lungs bilaterally, consistent with interstitial edema. More focal opacity at the LEFT lung base likely represents scarring. No discrete consolidations. IMPRESSION: Increased interstitial markings consistent with edema. Electronically Signed   By: Nolon Nations M.D.   On: 09/21/2021 11:45    Scheduled Meds:  aspirin EC  81 mg Oral Daily   carvedilol  25 mg Oral Q supper   And   [START ON 09/23/2021] carvedilol  25 mg Oral Once per day on Sun Tue Thu Sat   Chlorhexidine Gluconate Cloth  6 each Topical Q0600   cholecalciferol  5,000  Units Oral Daily   fluconazole  100 mg Oral Daily   FLUoxetine  40 mg Oral Daily   furosemide  80 mg Oral Daily   Gerhardt's butt cream   Topical Q4H   heparin  5,000 Units Subcutaneous Q8H   ipratropium-albuterol  3 mL Nebulization Q6H   irbesartan  150 mg Oral Daily   isosorbide dinitrate  5 mg Oral QHS   levothyroxine  75 mcg Oral Daily   melatonin  5 mg Oral QHS   methylPREDNISolone sodium succinate  40 mg Intravenous Q12H   polyethylene glycol  17 g Oral Daily    Continuous Infusions:  sodium chloride     sodium chloride       LOS: 1 day     Kayleen Memos, MD [facility] Pager [phone]  If 7PM-7AM, please contact night-coverage [email or web address] Password [email or web address] 09/22/2021, 11:00 AM

## 2021-09-22 NOTE — Progress Notes (Signed)
*  PRELIMINARY RESULTS* Echocardiogram 2D Echocardiogram has been performed.  Dawn Bradford Dawn Bradford Dawn Bradford 09/22/2021, 11:34 AM

## 2021-09-22 NOTE — Progress Notes (Incomplete)
[facility]  ROUNDING NOTE   Subjective:   Dawn Bradford is an 86 year old Caucasian female with past medical conditions including COPD, GERD, CHF, dementia, end-stage renal disease on hemodialysis.  Patient presents to the emergency department with complaints of shortness of breath and altered mental status from baseline.  Patient has been admitted for Acute pulmonary edema (HCC) [J81.0] COPD exacerbation (HCC) [J44.1] Acute respiratory failure with hypoxia (HCC) [J96.01] Acute on chronic diastolic CHF (congestive heart failure) ([place]) [I50.33] Acute on chronic respiratory failure with hypoxia ([place]) [J96.21]  Patient is known to our clinic and receives outpatient dialysis at [facility] on a MWF schedule supervised by Dr. Candiss Norse.     Objective:  Vital signs in last 24 hours:  Temp:  [97.6 F (36.4 C)-98.3 F (36.8 C)] 97.6 F (36.4 C) (01/18 0815) Pulse Rate:  [63-84] 84 (01/18 0815) Resp:  [18-26] 22 (01/18 0815) BP: (132-171)/(50-95) 132/50 (01/18 0815) SpO2:  [90 %-100 %] 98 % (01/18 0815) Weight:  [78.1 kg-84.5 kg] 78.1 kg (01/18 0500)  Weight change:  Filed Weights   09/21/21 1119 09/21/21 2212 09/22/21 0500  Weight: 84.5 kg 79.3 kg 78.1 kg    Intake/Output: I/O last 3 completed shifts: In: -  Out: 1450 [Urine:1450]   Intake/Output this shift:  Total I/O In: 120 [P.O.:120] Out: -   Physical Exam: General: NAD, resting on stretcher  Head: Normocephalic, atraumatic. Moist oral mucosal membranes  Eyes: Anicteric  Lungs:  Wheezing, normal effort, 2L [place]  Heart: Regular rate and rhythm  Abdomen:  Soft, nontender  Extremities: Trace peripheral edema.  Neurologic: Nonfocal, moving all four extremities  Skin: No lesions  Access: Left aVF    Basic Metabolic Panel: Recent Labs  Lab 09/21/21 1126 09/22/21 0713  NA 137 134*  K 3.9 3.3*  CL 102 98  CO2 26 27  GLUCOSE 170* 193*  BUN 11 19  CREATININE 2.04* 2.53*  CALCIUM 8.8* 8.7*      Liver Function Tests: Recent Labs  Lab 09/21/21 1126  AST 21  ALT 18  ALKPHOS 93  BILITOT 0.8  PROT 6.1*  ALBUMIN 3.0*    No results for input(s): LIPASE, AMYLASE in the last 168 hours. No results for input(s): AMMONIA in the last 168 hours.  CBC: Recent Labs  Lab 09/21/21 1126  WBC 7.2  NEUTROABS 5.2  HGB 11.2*  HCT 34.3*  MCV 107.2*  PLT 134*     Cardiac Enzymes: No results for input(s): CKTOTAL, CKMB, CKMBINDEX, TROPONINI in the last 168 hours.  BNP: Invalid input(s): POCBNP  CBG: No results for input(s): GLUCAP in the last 168 hours.  Microbiology: Results for orders placed or performed during the hospital encounter of 09/21/21  Resp Panel by RT-PCR (Flu A&B, Covid) Nasopharyngeal Swab     Status: None   Collection Time: 09/21/21 11:26 AM   Specimen: Nasopharyngeal Swab; Nasopharyngeal(NP) swabs in vial transport medium  Result Value Ref Range Status   SARS Coronavirus 2 by RT PCR NEGATIVE NEGATIVE Final    Comment: (NOTE) SARS-CoV-2 target nucleic acids are NOT DETECTED.  The SARS-CoV-2 RNA is generally detectable in upper respiratory specimens during the acute phase of infection. The lowest concentration of SARS-CoV-2 viral copies this assay can detect is 138 copies/mL. A negative result does not preclude SARS-Cov-2 infection and should not be used as the sole basis for treatment or other patient management decisions. A negative result may occur with  improper specimen collection/handling, submission of specimen other than  nasopharyngeal swab, presence of viral mutation(s) within the areas targeted by this assay, and inadequate number of viral copies(<138 copies/mL). A negative result must be combined with clinical observations, patient history, and epidemiological information. The expected result is Negative.  Fact Sheet for Patients:  EntrepreneurPulse.com.au  Fact Sheet for Healthcare Providers:   IncredibleEmployment.be  This test is no t yet approved or cleared by the [place] FDA and  has been authorized for detection and/or diagnosis of SARS-CoV-2 by FDA under an Emergency Use Authorization (EUA). This EUA will remain  in effect (meaning this test can be used) for the duration of the COVID-19 declaration under Section 564(b)(1) of the Act, 21 U.S.C.section 360bbb-3(b)(1), unless the authorization is terminated  or revoked sooner.       Influenza A by PCR NEGATIVE NEGATIVE Final   Influenza B by PCR NEGATIVE NEGATIVE Final    Comment: (NOTE) The Xpert Xpress SARS-CoV-2/FLU/RSV plus assay is intended as an aid in the diagnosis of influenza from Nasopharyngeal swab specimens and should not be used as a sole basis for treatment. Nasal washings and aspirates are unacceptable for Xpert Xpress SARS-CoV-2/FLU/RSV testing.  Fact Sheet for Patients: EntrepreneurPulse.com.au  Fact Sheet for Healthcare Providers: IncredibleEmployment.be  This test is not yet approved or cleared by the [place] FDA and has been authorized for detection and/or diagnosis of SARS-CoV-2 by FDA under an Emergency Use Authorization (EUA). This EUA will remain in effect (meaning this test can be used) for the duration of the COVID-19 declaration under Section 564(b)(1) of the Act, 21 U.S.C. section 360bbb-3(b)(1), unless the authorization is terminated or revoked.  Performed at [facility], [address] [place], [place] [address]     Coagulation Studies: No results for input(s): LABPROT, INR in the last 72 hours.  Urinalysis: No results for input(s): COLORURINE, LABSPEC, PHURINE, GLUCOSEU, HGBUR, BILIRUBINUR, KETONESUR, PROTEINUR, UROBILINOGEN, NITRITE, LEUKOCYTESUR in the last 72 hours.  Invalid input(s): APPERANCEUR    Imaging: DG Chest Port 1 View  Result Date: 09/21/2021 CLINICAL DATA:  Shortness of breath.  EXAM: PORTABLE CHEST 1 VIEW COMPARISON:  12/03/2020 FINDINGS: Prior median sternotomy. Heart size is normal. There is slight increase in interstitial markings throughout the lungs bilaterally, consistent with interstitial edema. More focal opacity at the LEFT lung base likely represents scarring. No discrete consolidations. IMPRESSION: Increased interstitial markings consistent with edema. Electronically Signed   By: Nolon Nations M.D.   On: 09/21/2021 11:45     Medications:    sodium chloride     sodium chloride      aspirin EC  81 mg Oral Daily   carvedilol  25 mg Oral Q supper   And   [START ON 09/23/2021] carvedilol  25 mg Oral Once per day on Sun Tue Thu Sat   Chlorhexidine Gluconate Cloth  6 each Topical Q0600   cholecalciferol  5,000 Units Oral Daily   fluconazole  100 mg Oral Daily   FLUoxetine  40 mg Oral Daily   furosemide  80 mg Oral Daily   Gerhardt's butt cream   Topical Q4H   heparin  5,000 Units Subcutaneous Q8H   ipratropium-albuterol  3 mL Nebulization Q6H   irbesartan  150 mg Oral Daily   isosorbide dinitrate  5 mg Oral QHS   levothyroxine  75 mcg Oral Daily   melatonin  5 mg Oral QHS   methylPREDNISolone sodium succinate  40 mg Intravenous Q12H   polyethylene glycol  17 g Oral Daily   sodium chloride,  sodium chloride, acetaminophen, albuterol, alteplase, clonazepam, dextromethorphan-guaiFENesin, heparin, hydrALAZINE, lidocaine (PF), lidocaine-prilocaine, lidocaine-prilocaine, ondansetron (ZOFRAN) IV, pentafluoroprop-tetrafluoroeth, senna-docusate, traMADol  Assessment/ Plan:  Ms. Dawn Bradford is a 86 y.o.  female  past medical conditions including COPD, GERD, CHF, dementia, end-stage renal disease on hemodialysis.  Patient presents to the emergency department with complaints of shortness of breath and altered mental status from baseline.  Patient has been admitted for Acute pulmonary edema (HCC) [J81.0] COPD exacerbation (HCC) [J44.1] Acute respiratory failure  with hypoxia (HCC) [J96.01] Acute on chronic diastolic CHF (congestive heart failure) (HCC) [I50.33] Acute on chronic respiratory failure with hypoxia (HCC) [J96.21]  [facility] [place]/MWF/left aVF/80.5 kg  End-stage renal disease on hemodialysis.  Last treatment received yesterday.  We will maintain outpatient schedule if possible.  Do not feel patient requires extra dialysis treatment today.  Next treatment scheduled for Wednesday.  2. Anemia of chronic kidney disease Lab Results  Component Value Date   HGB 11.2 (L) 09/21/2021    Hemoglobin within acceptable range.  3. Secondary Hyperparathyroidism:  Lab Results  Component Value Date   PTH 59 08/13/2018   CALCIUM 8.7 (L) 09/22/2021   PHOS 3.6 08/17/2018    Calcium and phosphorus within acceptable range.  We will continue to monitor     LOS: 1 [place] 1/18/202310:41 AM

## 2021-09-22 NOTE — Progress Notes (Signed)
[facility]  ROUNDING NOTE   Subjective:   Dawn Bradford is an 86 year old Caucasian female with past medical conditions including COPD, GERD, CHF, dementia, end-stage renal disease on hemodialysis.  Patient presents to the emergency department with complaints of shortness of breath and altered mental status from baseline.  Patient has been admitted for Acute pulmonary edema (HCC) [J81.0] COPD exacerbation (HCC) [J44.1] Acute respiratory failure with hypoxia (HCC) [J96.01] Acute on chronic diastolic CHF (congestive heart failure) ([place]) [I50.33] Acute on chronic respiratory failure with hypoxia ([place]) [J96.21]  Patient is known to our clinic and receives outpatient dialysis at [facility] on a MWF schedule supervised by Dr. Candiss Norse.   Patient seen with daughter and son in law at bedside Reports patient agitated today, wanting to go home Requested Clonazepam.   Plan for dialysis this afternoon   Objective:  Vital signs in last 24 hours:  Temp:  [97.6 F (36.4 C)-98.3 F (36.8 C)] 97.6 F (36.4 C) (01/18 0815) Pulse Rate:  [63-84] 84 (01/18 0815) Resp:  [18-26] 22 (01/18 0815) BP: (132-171)/(50-95) 132/50 (01/18 0815) SpO2:  [90 %-100 %] 98 % (01/18 0815) Weight:  [78.1 kg-84.5 kg] 78.1 kg (01/18 0500)  Weight change:  Filed Weights   09/21/21 1119 09/21/21 2212 09/22/21 0500  Weight: 84.5 kg 79.3 kg 78.1 kg    Intake/Output: I/O last 3 completed shifts: In: -  Out: 1450 [Urine:1450]   Intake/Output this shift:  Total I/O In: 120 [P.O.:120] Out: -   Physical Exam: General: NAD, resting in bed  Head: Normocephalic, atraumatic. Moist oral mucosal membranes  Eyes: Anicteric  Lungs:  Clear bilaterally, normal effort, 2L [place]  Heart: Regular rate and rhythm  Abdomen:  Soft, nontender  Extremities: Trace peripheral edema.  Neurologic: Nonfocal, moving all four extremities  Skin: No lesions  Access: Left aVF    Basic Metabolic Panel: Recent Labs   Lab 09/21/21 1126 09/22/21 0713  NA 137 134*  K 3.9 3.3*  CL 102 98  CO2 26 27  GLUCOSE 170* 193*  BUN 11 19  CREATININE 2.04* 2.53*  CALCIUM 8.8* 8.7*     Liver Function Tests: Recent Labs  Lab 09/21/21 1126  AST 21  ALT 18  ALKPHOS 93  BILITOT 0.8  PROT 6.1*  ALBUMIN 3.0*    No results for input(s): LIPASE, AMYLASE in the last 168 hours. No results for input(s): AMMONIA in the last 168 hours.  CBC: Recent Labs  Lab 09/21/21 1126  WBC 7.2  NEUTROABS 5.2  HGB 11.2*  HCT 34.3*  MCV 107.2*  PLT 134*     Cardiac Enzymes: No results for input(s): CKTOTAL, CKMB, CKMBINDEX, TROPONINI in the last 168 hours.  BNP: Invalid input(s): POCBNP  CBG: No results for input(s): GLUCAP in the last 168 hours.  Microbiology: Results for orders placed or performed during the hospital encounter of 09/21/21  Resp Panel by RT-PCR (Flu A&B, Covid) Nasopharyngeal Swab     Status: None   Collection Time: 09/21/21 11:26 AM   Specimen: Nasopharyngeal Swab; Nasopharyngeal(NP) swabs in vial transport medium  Result Value Ref Range Status   SARS Coronavirus 2 by RT PCR NEGATIVE NEGATIVE Final    Comment: (NOTE) SARS-CoV-2 target nucleic acids are NOT DETECTED.  The SARS-CoV-2 RNA is generally detectable in upper respiratory specimens during the acute phase of infection. The lowest concentration of SARS-CoV-2 viral copies this assay can detect is 138 copies/mL. A negative result does not preclude SARS-Cov-2 infection and should not  be used as the sole basis for treatment or other patient management decisions. A negative result may occur with  improper specimen collection/handling, submission of specimen other than nasopharyngeal swab, presence of viral mutation(s) within the areas targeted by this assay, and inadequate number of viral copies(<138 copies/mL). A negative result must be combined with clinical observations, patient history, and epidemiological information. The  expected result is Negative.  Fact Sheet for Patients:  EntrepreneurPulse.com.au  Fact Sheet for Healthcare Providers:  IncredibleEmployment.be  This test is no t yet approved or cleared by the [place] FDA and  has been authorized for detection and/or diagnosis of SARS-CoV-2 by FDA under an Emergency Use Authorization (EUA). This EUA will remain  in effect (meaning this test can be used) for the duration of the COVID-19 declaration under Section 564(b)(1) of the Act, 21 U.S.C.section 360bbb-3(b)(1), unless the authorization is terminated  or revoked sooner.       Influenza A by PCR NEGATIVE NEGATIVE Final   Influenza B by PCR NEGATIVE NEGATIVE Final    Comment: (NOTE) The Xpert Xpress SARS-CoV-2/FLU/RSV plus assay is intended as an aid in the diagnosis of influenza from Nasopharyngeal swab specimens and should not be used as a sole basis for treatment. Nasal washings and aspirates are unacceptable for Xpert Xpress SARS-CoV-2/FLU/RSV testing.  Fact Sheet for Patients: EntrepreneurPulse.com.au  Fact Sheet for Healthcare Providers: IncredibleEmployment.be  This test is not yet approved or cleared by the [place] FDA and has been authorized for detection and/or diagnosis of SARS-CoV-2 by FDA under an Emergency Use Authorization (EUA). This EUA will remain in effect (meaning this test can be used) for the duration of the COVID-19 declaration under Section 564(b)(1) of the Act, 21 U.S.C. section 360bbb-3(b)(1), unless the authorization is terminated or revoked.  Performed at [facility], [address] [place], [place] [address]     Coagulation Studies: No results for input(s): LABPROT, INR in the last 72 hours.  Urinalysis: No results for input(s): COLORURINE, LABSPEC, PHURINE, GLUCOSEU, HGBUR, BILIRUBINUR, KETONESUR, PROTEINUR, UROBILINOGEN, NITRITE, LEUKOCYTESUR in the last 72  hours.  Invalid input(s): APPERANCEUR    Imaging: DG Chest Port 1 View  Result Date: 09/21/2021 CLINICAL DATA:  Shortness of breath. EXAM: PORTABLE CHEST 1 VIEW COMPARISON:  12/03/2020 FINDINGS: Prior median sternotomy. Heart size is normal. There is slight increase in interstitial markings throughout the lungs bilaterally, consistent with interstitial edema. More focal opacity at the LEFT lung base likely represents scarring. No discrete consolidations. IMPRESSION: Increased interstitial markings consistent with edema. Electronically Signed   By: Nolon Nations M.D.   On: 09/21/2021 11:45     Medications:    sodium chloride     sodium chloride      aspirin EC  81 mg Oral Daily   carvedilol  25 mg Oral Q supper   And   [START ON 09/23/2021] carvedilol  25 mg Oral Once per day on Sun Tue Thu Sat   Chlorhexidine Gluconate Cloth  6 each Topical Q0600   cholecalciferol  5,000 Units Oral Daily   fluconazole  100 mg Oral Daily   FLUoxetine  40 mg Oral Daily   furosemide  80 mg Oral Daily   Gerhardt's butt cream   Topical Q4H   heparin  5,000 Units Subcutaneous Q8H   ipratropium-albuterol  3 mL Nebulization Q6H   irbesartan  150 mg Oral Daily   isosorbide dinitrate  5 mg Oral QHS   levothyroxine  75 mcg Oral Daily   melatonin  5 mg Oral QHS   methylPREDNISolone sodium succinate  40 mg Intravenous Q12H   polyethylene glycol  17 g Oral Daily   sodium chloride, sodium chloride, acetaminophen, albuterol, alteplase, clonazepam, dextromethorphan-guaiFENesin, heparin, hydrALAZINE, lidocaine (PF), lidocaine-prilocaine, lidocaine-prilocaine, ondansetron (ZOFRAN) IV, pentafluoroprop-tetrafluoroeth, senna-docusate, traMADol  Assessment/ Plan:  Ms. KAROLYN MESSING is a 85 y.o.  female  past medical conditions including COPD, GERD, CHF, dementia, end-stage renal disease on hemodialysis.  Patient presents to the emergency department with complaints of shortness of breath and altered mental status from  baseline.  Patient has been admitted for Acute pulmonary edema (HCC) [J81.0] COPD exacerbation (HCC) [J44.1] Acute respiratory failure with hypoxia (HCC) [J96.01] Acute on chronic diastolic CHF (congestive heart failure) (HCC) [I50.33] Acute on chronic respiratory failure with hypoxia (HCC) [J96.21]  [facility]/MWF/left aVF/80.5 kg  End-stage renal disease on hemodialysis.  Last treatment received Monday.  We will maintain outpatient schedule if possible.  Plan to dialyze later today.  2. Anemia of chronic kidney disease Lab Results  Component Value Date   HGB 11.2 (L) 09/21/2021    Hemoglobin at goal  3. Secondary Hyperparathyroidism:  Lab Results  Component Value Date   PTH 59 08/13/2018   CALCIUM 8.7 (L) 09/22/2021   PHOS 3.6 08/17/2018    Calcium within range. Will add on phosphorus for updated result.  We will continue to monitor  4. COPD with asthma exacerbation. Continue supportive therapies with nebs. Prescribed Solumedrol IV.    LOS: 1 [place] 1/18/202311:13 AM

## 2021-09-22 NOTE — Progress Notes (Signed)
Patient completed a scheduled 3-hour HD session, AVF cannulated with difficulty arterial site with clots, requiring an additional stick, able to maintain prescribed BFR for most of treatment. Targeted UF met, with net 1.5-liter fluid removal. Patient pleasant, calm, and cooperative throughout treatment. No medication nor labs drawn this session. Report given to primary nurse, patient returned to assigned room.

## 2021-09-22 NOTE — Evaluation (Addendum)
Clinical/Bedside Swallow Evaluation Patient Details  Name: Dawn Bradford MRN: [id number] Date of Birth: 08-18-1936  Today's Date: 09/22/2021 Time: SLP Start Time (ACUTE ONLY): 0845 SLP Stop Time (ACUTE ONLY): 0945 SLP Time Calculation (min) (ACUTE ONLY): 60 min  Past Medical History:  Past Medical History:  Diagnosis Date   ACE-inhibitor cough    Aortic valve disorder    Echo in 2009 showed mean aortic valve gradient of 13 mmHg, suggesting very mild stenosis. Echo (12/10) suggested aortic sclerosis only   Carotid artery disease (HCC)    mild, carotid dopplers 12/2009   CHF (congestive heart failure) (HCC)    COPD (chronic obstructive pulmonary disease) (HCC)    Coronary artery disease    s/p anterior MI in 1996 followed by CABG. Lexiscan myoview (4/11): EF 67%, normal perfusion with no evidence for ischemia or infarction.    Diabetes mellitus    type 2   Diastolic heart failure    most recent echo (12/10) showed EF 55-60% with mild LVH, grade I diastolic dysfunction, mild LAE.   ESRD (end stage renal disease) on dialysis ([place])    Hyperlipidemia    Hypertension    resistant hyptertension times many years. the patient does have renal artery stenosis. she has tried calcium channel blockers in the past and states that she would not take them now because she had some problems with her gums which her dentist identified as calcium-channel blocker side effects.   Hypothyroidism    Mild asthma    PFT 02/05/10 FEV1 1.38, FEV1% 73, TLC 3.78 (86%), DLCO 48%, +BD   Myocardial infarction (HCC)    Obesity    Obesity hypoventilation syndrome ([place])        Obstructive sleep apnea    PSG 01/05/2006 AHI 24.8, CPAP 9cm H2O   Pulmonary nodule, right    Renal artery stenosis ([place])    The patient has an occluded right renal artery and an atrophic right kidney. There is 20% left renal artery stenosis. This was seen by catheterization in 2007   Stroke [facility])    Past Surgical History:  Past Surgical History:   Procedure Laterality Date   A/V FISTULAGRAM N/A 01/25/2018   Procedure: A/V FISTULAGRAM;  Surgeon: Algernon Huxley, MD;  Location: [place] [facility];  Service: Cardiovascular;  Laterality: N/A;   A/V FISTULAGRAM Left 02/21/2019   Procedure: A/V FISTULAGRAM;  Surgeon: Algernon Huxley, MD;  Location: [place] [facility];  Service: Cardiovascular;  Laterality: Left;   A/V FISTULAGRAM Left 10/24/2019   Procedure: A/V FISTULAGRAM;  Surgeon: Algernon Huxley, MD;  Location: [place] [facility];  Service: Cardiovascular;  Laterality: Left;   ABDOMINAL HYSTERECTOMY  1985   AV FISTULA PLACEMENT     CATARACT EXTRACTION  2010   CORONARY ARTERY BYPASS GRAFT  1996   DIALYSIS FISTULA CREATION     INNER EAR SURGERY  1979   right   INTRAMEDULLARY (IM) NAIL INTERTROCHANTERIC Right 08/17/2018   Procedure: INTRAMEDULLARY (IM) NAIL INTERTROCHANTRIC;  Surgeon: Earnestine Leys, MD;  Location: [facility];  Service: Orthopedics;  Laterality: Right;   PERIPHERAL VASCULAR BALLOON ANGIOPLASTY  02/21/2019   Procedure: PERIPHERAL VASCULAR BALLOON ANGIOPLASTY;  Surgeon: Algernon Huxley, MD;  Location: [place] [facility];  Service: Cardiovascular;;   PERIPHERAL VASCULAR CATHETERIZATION Left 01/06/2015   Procedure: A/V Shuntogram/Fistulagram;  Surgeon: Katha Cabal, MD;  Location: [place] [facility];  Service: Cardiovascular;  Laterality: Left;   PERIPHERAL VASCULAR CATHETERIZATION N/A 02/17/2015   Procedure: Dialysis/Perma  Catheter Removal;  Surgeon: Katha Cabal, MD;  Location: [place] [facility];  Service: Cardiovascular;  Laterality: N/A;   PERIPHERAL VASCULAR CATHETERIZATION N/A 01/28/2016   Procedure: A/V Shuntogram/Fistulagram;  Surgeon: Algernon Huxley, MD;  Location: [place] [facility];  Service: Cardiovascular;  Laterality: N/A;   PERIPHERAL VASCULAR CATHETERIZATION N/A 01/28/2016   Procedure: A/V Shunt Intervention;  Surgeon: Algernon Huxley, MD;  Location: [place] [facility];  Service: Cardiovascular;  Laterality:  N/A;   TUBAL LIGATION  1973   HPI:  Pt  is a 86 y.o. female with a past medical history of severe dementia per caregiver report, COPD, GERD, CHF, end-stage renal disease on hemodialysis who presents via EMS for shortness of breath and worsening mental status.  Patient is unable to participate in history and review of systems.  Per EMS, they found patient to be at approximately 86% on room air and applied 3 L of nasal cannula which improved her oxygenation into the high 90s.  Patient does not wear any oxygen at baseline or at night.  Patient's caregiver also states that she is concerned patient may have aspirated as she had a coughing fit while eating breakfast this morning and does have a history of aspiration in the past.  Patient did have 1 episode of vomiting during breakfast as well.  CXR at admit: "Increased interstitial markings consistent with edema".  Pt has Caregivers at home d/t dependence for ADLs.    Assessment / Plan / Recommendation  Clinical Impression  Pt appears to present w/ grossly adequate oropharyngeal phase swallow function in light of significantly declined Cognitive status; Baseline Dementia. This can impact her overall awareness/timing of swallow and safety during po tasks which increases risk for aspiration, choking. Pt's risk for aspiration is present but can be reduced when following general aspiration precautions, monitored during oral intake for impulsive self-feeding behaviors, and when using a modified diet consistency. She required min-mod verbal/visual cues for during po tasks to redirect attention to tasks; hands-on cues to wait/pause b/t bites. Pt is also HOH.       Pt consumed several trials of ice chips, thin liquids, purees, soft solids w/ No overt clinical s/s of aspiration noted; no decline in vocal quality; no cough, and no decline in respiratory status during/post trials. Oral phase was adequate for bolus management and oral clearing of the boluses given.  Mastication of soft solids appropriate. However, pt was min Impulsive during self-feeding and tended to take Multiple bites -- this was monitored by Clinician and Caregiver to reduce risk for choking. Pt was instructed to take 1 bite then put the spoon down until she cleared her mouth. Pt required Mod support and guidance d/t Cognitive decline. Was able to hold own Cup during drinking which improves safety of swallowing -- NO Straws utilized to enhance oral awareness and lessen risk for aspiration w/ thin liquids. OM Exam appeared [facility] w/ No unilateral weakness noted.    D/t pt's Baseline, declined Cognitive status/Dementia w/ risk for aspiration, recommend continuing the dysphagia level 2(MINCED) foods for ease of mastication w/ Thin liquids via Cup; general aspiration precautions; reduce Distractions during meals and engage pt during po's at meal for self-feeding. Pills Crushed in Puree for safer swallowing. Support w/ hands-on monitoring of pt's self-feeding at meals d/t impulsivity and decreased awareness sec. to Cognitive decline. MD/NSG updated. ST services recommends follow w/ Palliative Care for [place] and education re: impact of Cognitive decline/Dementia on swallowing and oral intake overall. Handouts  and instruction given to Caregiver on general aspiration precautions as above.  SLP Visit Diagnosis: Dysphagia, unspecified (R13.10)    Aspiration Risk  Mild aspiration risk;Risk for inadequate nutrition/hydration (reduced w/ precautions and monitoring)    Diet Recommendation   dysphagia level 2(MINCED) foods for ease of mastication w/ Thin liquids via Cup(NO STRAWS); general aspiration precautions; reduce Distractions during meals and engage pt during po's at meal for self-feeding. Support w/ hands-on monitoring of pt's self-feeding at meals d/t impulsivity and decreased awareness sec. to Cognitive decline.  Medication Administration: Crushed with puree    Other  Recommendations Recommended Consults:   (Palliative care f/u; Dietician f/u) Oral Care Recommendations: Oral care BID;Oral care before and after PO;Staff/trained caregiver to provide oral care Other Recommendations:  (n/a)    Recommendations for follow up therapy are one component of a multi-disciplinary discharge planning process, led by the attending physician.  Recommendations may be updated based on patient status, additional functional criteria and insurance authorization.  Follow up Recommendations No SLP follow up      Assistance Recommended at Discharge Frequent or constant Supervision/Assistance  Functional Status Assessment Patient has had a recent decline in their functional status and/or demonstrates limited ability to make significant improvements in function in a reasonable and predictable amount of time  Frequency and Duration  (n/a)   (n/a)       Prognosis Prognosis for Safe Diet Advancement: Fair Barriers to Reach Goals: Cognitive deficits;Language deficits;Time post onset;Severity of deficits;Behavior      Swallow Study   General Date of Onset: 09/21/21 HPI: Pt  is a 86 y.o. female with a past medical history of severe dementia per caregiver report, COPD, GERD, CHF, end-stage renal disease on hemodialysis who presents via EMS for shortness of breath and worsening mental status.  Patient is unable to participate in history and review of systems.  Per EMS, they found patient to be at approximately 86% on room air and applied 3 L of nasal cannula which improved her oxygenation into the high 90s.  Patient does not wear any oxygen at baseline or at night.  Patient's caregiver also states that she is concerned patient may have aspirated as she had a coughing fit while eating breakfast this morning and does have a history of aspiration in the past.  Patient did have 1 episode of vomiting during breakfast as well.  CXR at admit: "Increased interstitial markings consistent with edema".  Pt has Caregivers at home d/t  dependence for ADLs. Type of Study: Bedside Swallow Evaluation Previous Swallow Assessment: 2016 Diet Prior to this Study: Dysphagia 2 (chopped);Thin liquids Temperature Spikes Noted: No (wbc 7.2) Respiratory Status: Nasal cannula (2L) History of Recent Intubation: No Behavior/Cognition: Alert;Cooperative;Pleasant mood;Confused;Distractible;Requires cueing Oral Cavity Assessment: Within Functional Limits Oral Care Completed by SLP: Yes Oral Cavity - Dentition: Adequate natural dentition Vision: Functional for self-feeding Self-Feeding Abilities: Able to feed self;Needs assist;Needs set up (Monitoring d/t impulsivity) Patient Positioning: Upright in bed (needed positioning support) Baseline Vocal Quality: Normal Volitional Cough: Strong Volitional Swallow: Able to elicit    Oral/Motor/Sensory Function Overall Oral Motor/Sensory Function: Within functional limits   Ice Chips Ice chips: Within functional limits Presentation: Spoon (fed; 2 trials)   Thin Liquid Thin Liquid: Within functional limits Presentation: Cup;Self Fed (~8 ozs) Other Comments: water, juice, coffee    Nectar Thick Nectar Thick Liquid: Not tested   Honey Thick Honey Thick Liquid: Not tested   Puree Puree: Within functional limits Presentation: Self Fed;Spoon (5+ trials)   Solid  Solid: Within functional limits Presentation: Self Fed;Spoon (15+ trials) Other Comments: monitored d/t impulsivity when self-feeding        Orinda Kenner, MS, CCC-SLP Speech Language Pathologist Rehab Services; [place] [phone] (ascom) Betzaira Mentel 09/22/2021,4:22 PM

## 2021-09-23 LAB — RENAL FUNCTION PANEL
Albumin: 3.1 g/dL — ABNORMAL LOW (ref 3.5–5.0)
Anion gap: 10 (ref 5–15)
BUN: 22 mg/dL (ref 8–23)
CO2: 28 mmol/L (ref 22–32)
Calcium: 8.5 mg/dL — ABNORMAL LOW (ref 8.9–10.3)
Chloride: 95 mmol/L — ABNORMAL LOW (ref 98–111)
Creatinine, Ser: 2.23 mg/dL — ABNORMAL HIGH (ref 0.44–1.00)
GFR, Estimated: 21 mL/min — ABNORMAL LOW (ref >60)
Glucose, Bld: 180 mg/dL — ABNORMAL HIGH (ref 70–99)
Phosphorus: 4.3 mg/dL (ref 2.5–4.6)
Potassium: 3.7 mmol/L (ref 3.5–5.1)
Sodium: 133 mmol/L — ABNORMAL LOW (ref 135–145)

## 2021-09-23 LAB — GLUCOSE, CAPILLARY
Glucose-Capillary: 192 mg/dL — ABNORMAL HIGH (ref 70–99)
Glucose-Capillary: 146 mg/dL — ABNORMAL HIGH (ref 70–99)
Glucose-Capillary: 195 mg/dL — ABNORMAL HIGH (ref 70–99)
Glucose-Capillary: 162 mg/dL — ABNORMAL HIGH (ref 70–99)

## 2021-09-23 MED ORDER — AZITHROMYCIN 250 MG PO TABS
500.0000 mg | ORAL_TABLET | Freq: Every day | ORAL | Status: AC
  Administered 2021-09-23: 500 mg via ORAL
  Filled 2021-09-23: qty 2

## 2021-09-23 MED ORDER — METHYLPREDNISOLONE SODIUM SUCC 40 MG IJ SOLR
40.0000 mg | Freq: Every day | INTRAMUSCULAR | Status: DC
  Administered 2021-09-23 – 2021-09-24 (×2): 40 mg via INTRAVENOUS
  Filled 2021-09-23 (×2): qty 1

## 2021-09-23 MED ORDER — AZITHROMYCIN 250 MG PO TABS
250.0000 mg | ORAL_TABLET | Freq: Every day | ORAL | Status: DC
  Administered 2021-09-24: 250 mg via ORAL
  Filled 2021-09-23: qty 1

## 2021-09-23 MED ORDER — IPRATROPIUM-ALBUTEROL 0.5-2.5 (3) MG/3ML IN SOLN
3.0000 mL | Freq: Two times a day (BID) | RESPIRATORY_TRACT | Status: DC
  Administered 2021-09-23 – 2021-09-24 (×2): 3 mL via RESPIRATORY_TRACT
  Filled 2021-09-23 (×2): qty 3

## 2021-09-23 NOTE — Care Management Important Message (Signed)
Important Message  Patient Details  Name: Dawn Bradford MRN: [id number] Date of Birth: February 03, 1936   Medicare Important Message Given:  Yes     Dannette Barbara 09/23/2021, 2:53 PM

## 2021-09-23 NOTE — Progress Notes (Signed)
[facility]  ROUNDING NOTE   Subjective:   Dawn Bradford is an 86 year old Caucasian female with past medical conditions including COPD, GERD, CHF, dementia, end-stage renal disease on hemodialysis.  Patient presents to the emergency department with complaints of shortness of breath and altered mental status from baseline.  Patient has been admitted for Acute pulmonary edema (HCC) [J81.0] COPD exacerbation (HCC) [J44.1] Acute respiratory failure with hypoxia (HCC) [J96.01] Acute on chronic diastolic CHF (congestive heart failure) ([place]) [I50.33] Acute on chronic respiratory failure with hypoxia ([place]) [J96.21]  Patient is known to our clinic and receives outpatient dialysis at [facility] on a MWF schedule supervised by Dr. Candiss Norse.   Patient sitting up in bed, feeding herself breakfast Personal aide at bedside  Received dialysis yesterday  Objective:  Vital signs in last 24 hours:  Temp:  [97.4 F (36.3 C)-98.6 F (37 C)] 98.1 F (36.7 C) (01/19 0732) Pulse Rate:  [69-95] 88 (01/19 0732) Resp:  [15-27] 18 (01/19 0732) BP: (114-162)/(44-128) 121/61 (01/19 0732) SpO2:  [94 %-100 %] 98 % (01/19 0732) Weight:  [77 kg-78.4 kg] 77.8 kg (01/19 0310)  Weight change: -6.06 kg Filed Weights   09/22/21 1900 09/22/21 1953 09/23/21 0310  Weight: 77 kg 77.8 kg 77.8 kg    Intake/Output: I/O last 3 completed shifts: In: 360 [P.O.:360] Out: 3203 [Urine:1700; Other:1503]   Intake/Output this shift:  Total I/O In: 240 [P.O.:240] Out: -   Physical Exam: General: NAD, resting in bed  Head: Normocephalic, atraumatic. Moist oral mucosal membranes  Eyes: Anicteric  Lungs:  Clear bilaterally, normal effort, 2L [place]  Heart: Regular rate and rhythm  Abdomen:  Soft, nontender  Extremities: Trace peripheral edema.  Neurologic: Nonfocal, moving all four extremities  Skin: No lesions  Access: Left aVF    Basic Metabolic Panel: Recent Labs  Lab 09/21/21 1126  09/22/21 0713 09/23/21 0756  NA 137 134* 133*  K 3.9 3.3* 3.7  CL 102 98 95*  CO2 26 27 28   GLUCOSE 170* 193* 180*  BUN 11 19 22   CREATININE 2.04* 2.53* 2.23*  CALCIUM 8.8* 8.7* 8.5*  PHOS  --  3.6 4.3     Liver Function Tests: Recent Labs  Lab 09/21/21 1126 09/23/21 0756  AST 21  --   ALT 18  --   ALKPHOS 93  --   BILITOT 0.8  --   PROT 6.1*  --   ALBUMIN 3.0* 3.1*    No results for input(s): LIPASE, AMYLASE in the last 168 hours. No results for input(s): AMMONIA in the last 168 hours.  CBC: Recent Labs  Lab 09/21/21 1126  WBC 7.2  NEUTROABS 5.2  HGB 11.2*  HCT 34.3*  MCV 107.2*  PLT 134*     Cardiac Enzymes: No results for input(s): CKTOTAL, CKMB, CKMBINDEX, TROPONINI in the last 168 hours.  BNP: Invalid input(s): POCBNP  CBG: Recent Labs  Lab 09/22/21 2131 09/23/21 0728  GLUCAP 182* 192*    Microbiology: Results for orders placed or performed during the hospital encounter of 09/21/21  Resp Panel by RT-PCR (Flu A&B, Covid) Nasopharyngeal Swab     Status: None   Collection Time: 09/21/21 11:26 AM   Specimen: Nasopharyngeal Swab; Nasopharyngeal(NP) swabs in vial transport medium  Result Value Ref Range Status   SARS Coronavirus 2 by RT PCR NEGATIVE NEGATIVE Final    Comment: (NOTE) SARS-CoV-2 target nucleic acids are NOT DETECTED.  The SARS-CoV-2 RNA is generally detectable in upper respiratory specimens during the  acute phase of infection. The lowest concentration of SARS-CoV-2 viral copies this assay can detect is 138 copies/mL. A negative result does not preclude SARS-Cov-2 infection and should not be used as the sole basis for treatment or other patient management decisions. A negative result may occur with  improper specimen collection/handling, submission of specimen other than nasopharyngeal swab, presence of viral mutation(s) within the areas targeted by this assay, and inadequate number of viral copies(<138 copies/mL). A negative  result must be combined with clinical observations, patient history, and epidemiological information. The expected result is Negative.  Fact Sheet for Patients:  EntrepreneurPulse.com.au  Fact Sheet for Healthcare Providers:  IncredibleEmployment.be  This test is no t yet approved or cleared by the [place] FDA and  has been authorized for detection and/or diagnosis of SARS-CoV-2 by FDA under an Emergency Use Authorization (EUA). This EUA will remain  in effect (meaning this test can be used) for the duration of the COVID-19 declaration under Section 564(b)(1) of the Act, 21 U.S.C.section 360bbb-3(b)(1), unless the authorization is terminated  or revoked sooner.       Influenza A by PCR NEGATIVE NEGATIVE Final   Influenza B by PCR NEGATIVE NEGATIVE Final    Comment: (NOTE) The Xpert Xpress SARS-CoV-2/FLU/RSV plus assay is intended as an aid in the diagnosis of influenza from Nasopharyngeal swab specimens and should not be used as a sole basis for treatment. Nasal washings and aspirates are unacceptable for Xpert Xpress SARS-CoV-2/FLU/RSV testing.  Fact Sheet for Patients: EntrepreneurPulse.com.au  Fact Sheet for Healthcare Providers: IncredibleEmployment.be  This test is not yet approved or cleared by the [place] FDA and has been authorized for detection and/or diagnosis of SARS-CoV-2 by FDA under an Emergency Use Authorization (EUA). This EUA will remain in effect (meaning this test can be used) for the duration of the COVID-19 declaration under Section 564(b)(1) of the Act, 21 U.S.C. section 360bbb-3(b)(1), unless the authorization is terminated or revoked.  Performed at [facility], [address] [place], [place] [address]     Coagulation Studies: No results for input(s): LABPROT, INR in the last 72 hours.  Urinalysis: No results for input(s): COLORURINE, LABSPEC,  PHURINE, GLUCOSEU, HGBUR, BILIRUBINUR, KETONESUR, PROTEINUR, UROBILINOGEN, NITRITE, LEUKOCYTESUR in the last 72 hours.  Invalid input(s): APPERANCEUR    Imaging: DG Chest Port 1 View  Result Date: 09/21/2021 CLINICAL DATA:  Shortness of breath. EXAM: PORTABLE CHEST 1 VIEW COMPARISON:  12/03/2020 FINDINGS: Prior median sternotomy. Heart size is normal. There is slight increase in interstitial markings throughout the lungs bilaterally, consistent with interstitial edema. More focal opacity at the LEFT lung base likely represents scarring. No discrete consolidations. IMPRESSION: Increased interstitial markings consistent with edema. Electronically Signed   By: Nolon Nations M.D.   On: 09/21/2021 11:45   ECHOCARDIOGRAM COMPLETE  Result Date: 09/22/2021    ECHOCARDIOGRAM REPORT   Patient Name:   Dawn Bradford Date of Exam: 09/22/2021 Medical Rec #:  [id number]    Height:       63.0 in Accession #:    [id number]   Weight:       172.2 lb Date of Birth:  [birth year]    BSA:          1.814 m Patient Age:    77 years     BP:           132/50 mmHg Patient Gender: F            HR:  71 bpm. Exam Location:  [facility] Procedure: 2D Echo, Color Doppler and Cardiac Doppler Indications:     I50.31 congestive heart failure-Acute Diastolic  History:         Patient has prior history of Echocardiogram examinations. CAD,                  Prior CABG, COPD, ESRD and Stroke; Risk Factors:Diabetes and                  Sleep Apnea.  Sonographer:     Charmayne Sheer Referring Phys:  [id number] Soledad Gerlach NIU Diagnosing Phys: Serafina Royals MD  Sonographer Comments: No subcostal window. IMPRESSIONS  1. Left ventricular ejection fraction, by estimation, is 60 to 65%. The left ventricle has normal function. The left ventricle has no regional wall motion abnormalities. Left ventricular diastolic parameters were normal.  2. Right ventricular systolic function is normal. The right ventricular size is normal.  3. Left atrial size was moderately  dilated.  4. The mitral valve is degenerative. Moderate mitral valve regurgitation.  5. The aortic valve is normal in structure. Aortic valve regurgitation is trivial. FINDINGS  Left Ventricle: Left ventricular ejection fraction, by estimation, is 60 to 65%. The left ventricle has normal function. The left ventricle has no regional wall motion abnormalities. The left ventricular internal cavity size was normal in size. There is  no left ventricular hypertrophy. Left ventricular diastolic parameters were normal. Right Ventricle: The right ventricular size is normal. No increase in right ventricular wall thickness. Right ventricular systolic function is normal. Left Atrium: Left atrial size was moderately dilated. Right Atrium: Right atrial size was normal in size. Pericardium: There is no evidence of pericardial effusion. Mitral Valve: The mitral valve is degenerative in appearance. Moderate mitral valve regurgitation. MV peak gradient, 7.4 mmHg. The mean mitral valve gradient is 3.0 mmHg. Tricuspid Valve: The tricuspid valve is normal in structure. Tricuspid valve regurgitation is mild. Aortic Valve: The aortic valve is normal in structure. Aortic valve regurgitation is trivial. Aortic valve mean gradient measures 7.0 mmHg. Aortic valve peak gradient measures 14.1 mmHg. Aortic valve area, by VTI measures 1.88 cm. Pulmonic Valve: The pulmonic valve was normal in structure. Pulmonic valve regurgitation is not visualized. Aorta: The aortic root and ascending aorta are structurally normal, with no evidence of dilitation. IAS/Shunts: No atrial level shunt detected by color flow Doppler.  LEFT VENTRICLE PLAX 2D LVIDd:         4.56 cm   Diastology LVIDs:         2.62 cm   LV e' medial:    6.09 cm/s LV PW:         1.06 cm   LV E/e' medial:  21.3 LV IVS:        0.95 cm   LV e' lateral:   8.70 cm/s LVOT diam:     2.00 cm   LV E/e' lateral: 14.9 LV SV:         75 LV SV Index:   41 LVOT Area:     3.14 cm  RIGHT VENTRICLE RV  Basal diam:  3.07 cm RV S prime:     8.59 cm/s LEFT ATRIUM             Index        RIGHT ATRIUM           Index LA diam:        5.10 cm 2.81 cm/m   RA Area:  13.60 cm LA Vol (A2C):   69.2 ml 38.14 ml/m  RA Volume:   29.40 ml  16.20 ml/m LA Vol (A4C):   63.2 ml 34.83 ml/m LA Biplane Vol: 70.7 ml 38.97 ml/m  AORTIC VALVE                     PULMONIC VALVE AV Area (Vmax):    1.72 cm      PV Vmax:       1.01 m/s AV Area (Vmean):   1.79 cm      PV Vmean:      67.000 cm/s AV Area (VTI):     1.88 cm      PV VTI:        0.239 m AV Vmax:           188.00 cm/s   PV Peak grad:  4.1 mmHg AV Vmean:          117.000 cm/s  PV Mean grad:  2.0 mmHg AV VTI:            0.399 m AV Peak Grad:      14.1 mmHg AV Mean Grad:      7.0 mmHg LVOT Vmax:         103.00 cm/s LVOT Vmean:        66.600 cm/s LVOT VTI:          0.239 m LVOT/AV VTI ratio: 0.60  AORTA Ao Root diam: 3.00 cm MITRAL VALVE                TRICUSPID VALVE MV Area (PHT): 3.00 cm     TR Peak grad:   25.2 mmHg MV Area VTI:   1.63 cm     TR Vmax:        251.00 cm/s MV Peak grad:  7.4 mmHg MV Mean grad:  3.0 mmHg     SHUNTS MV Vmax:       1.36 m/s     Systemic VTI:  0.24 m MV Vmean:      87.5 cm/s    Systemic Diam: 2.00 cm MV Decel Time: 253 msec MV E velocity: 130.00 cm/s MV A velocity: 113.00 cm/s MV E/A ratio:  1.15 Serafina Royals MD Electronically signed by Serafina Royals MD Signature Date/Time: 09/22/2021/12:47:20 PM    Final      Medications:      aspirin EC  81 mg Oral Daily   [START ON 09/24/2021] azithromycin  250 mg Oral Daily   carvedilol  25 mg Oral Q supper   And   carvedilol  25 mg Oral Once per day on Sun Tue Thu Sat   Chlorhexidine Gluconate Cloth  6 each Topical Q0600   cholecalciferol  5,000 Units Oral Daily   fluconazole  100 mg Oral Daily   FLUoxetine  40 mg Oral Daily   furosemide  80 mg Oral Daily   Gerhardt's butt cream   Topical Q4H   heparin  5,000 Units Subcutaneous Q8H   insulin aspart  0-5 Units Subcutaneous QHS    insulin aspart  0-9 Units Subcutaneous TID WC   ipratropium-albuterol  3 mL Nebulization BID   irbesartan  150 mg Oral Daily   isosorbide dinitrate  5 mg Oral QHS   levothyroxine  75 mcg Oral Daily   melatonin  5 mg Oral QHS   methylPREDNISolone sodium succinate  40 mg Intravenous Daily   polyethylene glycol  17 g Oral Daily   acetaminophen, albuterol,  clonazepam, dextromethorphan-guaiFENesin, hydrALAZINE, lidocaine-prilocaine, ondansetron (ZOFRAN) IV, senna-docusate, traMADol  Assessment/ Plan:  Ms. Dawn Bradford is a 86 y.o.  female  past medical conditions including COPD, GERD, CHF, dementia, end-stage renal disease on hemodialysis.  Patient presents to the emergency department with complaints of shortness of breath and altered mental status from baseline.  Patient has been admitted for Acute pulmonary edema (HCC) [J81.0] COPD exacerbation (HCC) [J44.1] Acute respiratory failure with hypoxia (HCC) [J96.01] Acute on chronic diastolic CHF (congestive heart failure) (HCC) [I50.33] Acute on chronic respiratory failure with hypoxia (HCC) [J96.21]  [facility] [place]/MWF/left aVF/80.5 kg  End-stage renal disease on hemodialysis.  Last treatment received Monday.  We will maintain outpatient schedule if possible. Received dialysis yesterday, UF goal 1.5L removed. Next treatment scheduled for Friday  2. Anemia of chronic kidney disease Lab Results  Component Value Date   HGB 11.2 (L) 09/21/2021    Hemoglobin remains at target  3. Secondary Hyperparathyroidism:  Lab Results  Component Value Date   PTH 59 08/13/2018   CALCIUM 8.5 (L) 09/23/2021   PHOS 4.3 09/23/2021    Calcium and phosphorus within acceptable range  4. COPD with asthma exacerbation. Continue supportive therapies with nebs. Prescribed Solumedrol IV.    LOS: 2   1/19/202311:04 AM

## 2021-09-23 NOTE — Progress Notes (Signed)
OT Cancellation Note  Patient Details Name: Dawn Bradford MRN: [id number] DOB: 1935-10-05   Cancelled Treatment:    Reason Eval/Treat Not Completed: Other (comment). Pt is non verbal this session and keeps eyes closed when OT enters the room. Pt has 24/7 caregivers at home . Caregiver present in room and reports pt requires assist for ADLs from bed level and they assist with her transfers at baseline. Pt has been able to ambulate short distances with HHPT with wheelchair follow. Caregiver feels pt is at baseline function and does not want OT or PT to see pt secondary to increased agitation concerns. OT to SIGN OFF based on this report and request.

## 2021-09-23 NOTE — Progress Notes (Addendum)
PROGRESS NOTE  Dawn Bradford HKV:[id number] DOB: November 18, 1935 DOA: 09/21/2021 PCP: [facility]  HPI/Recap of past 24 hours: Dawn Bradford is a 86 y.o. female with medical history significant of ESRD-HD (MWF), hypertension, hyperlipidemia, COPD, asthma, stroke, GERD, hypothyroidism, chronic depression/anxiety, OSA not using CPAP, CAD, CABG, dCHF, aortic valvular disease, anemia, carotid artery stenosis, renal artery stenosis, dementia, who presents with shortness of breath and hypoxia with O2 saturation of 86% on room air.  Also concern for possible aspiration when she vomited her breakfast the morning prior to her admission.  At baseline, patient recognizes family members, but not orientated to time and place.    Work-up reviewed asthma/COPD exacerbation and acute on chronic diastolic CHF.  She was started on IV steroids, bronchodilators, p.o. Lasix 80 mg daily, cardiac medications were resumed.  09/23/2021: Patient was seen and examined at her bedside.  Family member in the room.  She feels better after hemodialysis session done yesterday.  Still volume overload on exam, will likely need another hemodialysis session prior to discharge.  Appreciate nephrology's assistance    Assessment/Plan: Principal Problem:   Acute on chronic respiratory failure with hypoxia (HCC) Active Problems:   Hyperlipidemia   Chronic depression   Essential hypertension   CAD, ARTERY BYPASS GRAFT   ESRD on dialysis [facility])   Acquired hypothyroidism   Chronic cerebrovascular accident (CVA)   Anemia due to end stage renal disease (HCC)   Acute on chronic diastolic CHF (congestive heart failure) (HCC)   Acute pulmonary edema (HCC)   COPD exacerbation (HCC)   Elevated troponin   Sacral decubitus ulcer, stage II (HCC)  Acute on chronic respiratory failure with hypoxia ([place]): Likely due to combination of COPD exacerbation and acute pulmonary edema/CHF exacerbation.  Patient has wheezing on auscultation,  consistent with COPD exacerbation.  Patient has positive JVD, elevated BNP and interstitial pulm edema on chest x-ray, clinically consistent with acute pulmonary edema and CHF exacerbation. Symptomatology improved after hemodialysis on 09/22/2021.  Will benefit from another session of hemodialysis prior to discharge.  Appreciate nephrology's assistance. Personally reviewed chest x-ray done on admission which shows bilateral increasing pulmonary vascularity suggestive of pulm edema. Continue bronchodilators, diuretic, IV steroids. Continue to maintain O2 saturation greater than 92%.   Acute pulmonary edema and acute on chronic diastolic CHF (congestive heart failure): 2D echo on 02/01/2015 showed EF 55 to 60%.  Patient has been compliant to dialysis. -Volume management per renal by dialysis -2d echo -Daily weights -strict I/O's -Low salt diet -Fluid restriction -continue home lasix Management as stated above.   COPD/asthma exacerbation:  -Continue bronchodilators -Continue Solu-Medrol 40 mg IV bid -Mucinex for cough  -Incentive spirometry -sputum culture -Nasal cannula oxygen as needed to maintain O2 saturation 93% or greater -SLP   Hyperlipidemia: pt is not taking crestor -f/u with PCP   Chronic depression and anxiety -Continue home medications.: Klonopin, Prozac,   Essential hypertension -IV hydralazine as needed -Coreg, Lasix, Isordil, irbesartan   CAD, ARTERY BYPASS GRAFT and elevated troponin: Troponin 18, no chest pain, most likely due to decreased clearance secondary to ESRD -Trend troponin -Check A1c, FLP -Continue Crestor and aspirin   ESRD on dialysis (MWF) -Consulted Dr. Juleen China of renal for dialysis Volume status management per nephrology. HD on 09/22/2021. Appreciate nephrology's assistance with removing extra volume   Acquired hypothyroidism -Synthroid   Chronic cerebrovascular accident (CVA) - aspirin   Anemia due to end stage renal disease ([place]):  Hemoglobin stable 11.2 -Follow-up with CBC  Sacral decubitus ulcer, stage II ([place]) -Consulted wound care -add diflucan 100 mg daily   Resolved post hemodialysis: Hypokalemia Potassium 3.7 from 3.3.  Anemia of chronic disease in the setting of ESRD Hemoglobin is stable 11.2, MCV 107 No overt bleeding  Obesity BMI  30 Recommend weight los outpatient with regular physical activity and healthy dieting         DVT ppx: SQ Heparin 3 times daily Code Status: DNR per her daughter Family Communication: Family member at bedside. Disposition Plan:  Anticipate discharge back to previous environment when nephrology signs off. Consults called:  Dr. Juleen China of nephrology Admission status and Level of care: Progressive:   as inpt    Inpatient status.  Patient requires at least 2 midnights for further evaluation and treatment of present condition.  Objective: Vitals:   09/23/21 0310 09/23/21 0325 09/23/21 0732 09/23/21 1136  BP:  (!) 156/87 121/61 121/89  Pulse:  82 88 100  Resp:  15 18 17   Temp:  (!) 97.5 F (36.4 C) 98.1 F (36.7 C) 98.2 F (36.8 C)  TempSrc:  Oral  Axillary  SpO2:  96% 98% 98%  Weight: 77.8 kg     Height:        Intake/Output Summary (Last 24 hours) at 09/23/2021 1521 Last data filed at 09/23/2021 0950 Gross per 24 hour  Intake 240 ml  Output 1753 ml  Net -1513 ml   Filed Weights   09/22/21 1900 09/22/21 1953 09/23/21 0310  Weight: 77 kg 77.8 kg 77.8 kg    Exam:  General: 86 y.o. year-old female frail-appearing no acute stress.  She is alert and interactive. Cardiovascular: Regular rate and rhythm no rubs or gallops.   Respiratory: Mild rales at bases no wheezing noted.  Poor inspiratory effort.   Abdomen: Soft nontender normal bowel sounds present.   Musculoskeletal: Trace lower extremity edema bilaterally.   Skin: No ulcerative lesions noted.   Psychiatry: Mood is anxious.   Data Reviewed: CBC: Recent Labs  Lab 09/21/21 1126  WBC 7.2   NEUTROABS 5.2  HGB 11.2*  HCT 34.3*  MCV 107.2*  PLT 161*   Basic Metabolic Panel: Recent Labs  Lab 09/21/21 1126 09/22/21 0713 09/23/21 0756  NA 137 134* 133*  K 3.9 3.3* 3.7  CL 102 98 95*  CO2 26 27 28   GLUCOSE 170* 193* 180*  BUN 11 19 22   CREATININE 2.04* 2.53* 2.23*  CALCIUM 8.8* 8.7* 8.5*  PHOS  --  3.6 4.3   GFR: Estimated Creatinine Clearance: 18.9 mL/min (A) (by C-G formula based on SCr of 2.23 mg/dL (H)). Liver Function Tests: Recent Labs  Lab 09/21/21 1126 09/23/21 0756  AST 21  --   ALT 18  --   ALKPHOS 93  --   BILITOT 0.8  --   PROT 6.1*  --   ALBUMIN 3.0* 3.1*   No results for input(s): LIPASE, AMYLASE in the last 168 hours. No results for input(s): AMMONIA in the last 168 hours. Coagulation Profile: No results for input(s): INR, PROTIME in the last 168 hours. Cardiac Enzymes: No results for input(s): CKTOTAL, CKMB, CKMBINDEX, TROPONINI in the last 168 hours. BNP (last 3 results) No results for input(s): PROBNP in the last 8760 hours. HbA1C: Recent Labs    09/21/21 1515 09/22/21 0713  HGBA1C 6.3* 6.2*   CBG: Recent Labs  Lab 09/22/21 2131 09/23/21 0728 09/23/21 1140  GLUCAP 182* 192* 146*   Lipid Profile: Recent Labs    09/22/21 0713  CHOL 200  HDL 51  LDLCALC 138*  TRIG 57  CHOLHDL 3.9   Thyroid Function Tests: No results for input(s): TSH, T4TOTAL, FREET4, T3FREE, THYROIDAB in the last 72 hours. Anemia Panel: No results for input(s): VITAMINB12, FOLATE, FERRITIN, TIBC, IRON, RETICCTPCT in the last 72 hours. Urine analysis:    Component Value Date/Time   COLORURINE AMBER (A) 10/09/2019 2009   APPEARANCEUR Clear 04/20/2020 1321   LABSPEC 1.011 10/09/2019 2009   PHURINE 6.0 10/09/2019 2009   GLUCOSEU Negative 04/20/2020 1321   HGBUR NEGATIVE 10/09/2019 2009   BILIRUBINUR Negative 04/20/2020 [place] 10/09/2019 2009   PROTEINUR 2+ (A) 04/20/2020 1321   PROTEINUR 100 (A) 10/09/2019 2009   NITRITE  Negative 04/20/2020 1321   NITRITE NEGATIVE 10/09/2019 2009   LEUKOCYTESUR Trace (A) 04/20/2020 1321   LEUKOCYTESUR LARGE (A) 10/09/2019 2009   Sepsis Labs: @LABRCNTIP (procalcitonin:4,lacticidven:4)  ) Recent Results (from the past 240 hour(s))  Resp Panel by RT-PCR (Flu A&B, Covid) Nasopharyngeal Swab     Status: None   Collection Time: 09/21/21 11:26 AM   Specimen: Nasopharyngeal Swab; Nasopharyngeal(NP) swabs in vial transport medium  Result Value Ref Range Status   SARS Coronavirus 2 by RT PCR NEGATIVE NEGATIVE Final    Comment: (NOTE) SARS-CoV-2 target nucleic acids are NOT DETECTED.  The SARS-CoV-2 RNA is generally detectable in upper respiratory specimens during the acute phase of infection. The lowest concentration of SARS-CoV-2 viral copies this assay can detect is 138 copies/mL. A negative result does not preclude SARS-Cov-2 infection and should not be used as the sole basis for treatment or other patient management decisions. A negative result may occur with  improper specimen collection/handling, submission of specimen other than nasopharyngeal swab, presence of viral mutation(s) within the areas targeted by this assay, and inadequate number of viral copies(<138 copies/mL). A negative result must be combined with clinical observations, patient history, and epidemiological information. The expected result is Negative.  Fact Sheet for Patients:  EntrepreneurPulse.com.au  Fact Sheet for Healthcare Providers:  IncredibleEmployment.be  This test is no t yet approved or cleared by the [place] FDA and  has been authorized for detection and/or diagnosis of SARS-CoV-2 by FDA under an Emergency Use Authorization (EUA). This EUA will remain  in effect (meaning this test can be used) for the duration of the COVID-19 declaration under Section 564(b)(1) of the Act, 21 U.S.C.section 360bbb-3(b)(1), unless the authorization is terminated   or revoked sooner.       Influenza A by PCR NEGATIVE NEGATIVE Final   Influenza B by PCR NEGATIVE NEGATIVE Final    Comment: (NOTE) The Xpert Xpress SARS-CoV-2/FLU/RSV plus assay is intended as an aid in the diagnosis of influenza from Nasopharyngeal swab specimens and should not be used as a sole basis for treatment. Nasal washings and aspirates are unacceptable for Xpert Xpress SARS-CoV-2/FLU/RSV testing.  Fact Sheet for Patients: EntrepreneurPulse.com.au  Fact Sheet for Healthcare Providers: IncredibleEmployment.be  This test is not yet approved or cleared by the [place] FDA and has been authorized for detection and/or diagnosis of SARS-CoV-2 by FDA under an Emergency Use Authorization (EUA). This EUA will remain in effect (meaning this test can be used) for the duration of the COVID-19 declaration under Section 564(b)(1) of the Act, 21 U.S.C. section 360bbb-3(b)(1), unless the authorization is terminated or revoked.  Performed at [facility], [address] [place], [place] [address]       Studies: No results found.  Scheduled Meds:  aspirin  EC  81 mg Oral Daily   [START ON 09/24/2021] azithromycin  250 mg Oral Daily   carvedilol  25 mg Oral Q supper   And   carvedilol  25 mg Oral Once per day on Sun Tue Thu Sat   Chlorhexidine Gluconate Cloth  6 each Topical Q0600   cholecalciferol  5,000 Units Oral Daily   fluconazole  100 mg Oral Daily   FLUoxetine  40 mg Oral Daily   furosemide  80 mg Oral Daily   Gerhardt's butt cream   Topical Q4H   heparin  5,000 Units Subcutaneous Q8H   insulin aspart  0-5 Units Subcutaneous QHS   insulin aspart  0-9 Units Subcutaneous TID WC   ipratropium-albuterol  3 mL Nebulization BID   irbesartan  150 mg Oral Daily   isosorbide dinitrate  5 mg Oral QHS   levothyroxine  75 mcg Oral Daily   melatonin  5 mg Oral QHS   methylPREDNISolone sodium succinate  40 mg Intravenous Daily    polyethylene glycol  17 g Oral Daily    Continuous Infusions:     LOS: 2 days     Kayleen Memos, MD [facility] Pager [phone]  If 7PM-7AM, please contact night-coverage [email or web address] Password [email or web address] 09/23/2021, 3:21 PM

## 2021-09-23 NOTE — Progress Notes (Signed)
PT Cancellation Note  Patient Details Name: Dawn Bradford MRN: [id number] DOB: [birth year]   Cancelled Treatment:    Reason Eval/Treat Not Completed: Other (comment) (caregivers have declined PT needs at this time). Per discussion with OT, caregiver feels patient is at baseline function and does not want OT or PT to see secondary to increased agitation concerns while in the hospital. PT will sign off at this time. Please re-consult if this changes.  Minna Merritts, PT, MPT  Percell Locus 09/23/2021, 12:45 PM

## 2021-09-24 LAB — URINALYSIS, ROUTINE W REFLEX MICROSCOPIC
Bilirubin Urine: NEGATIVE
Glucose, UA: NEGATIVE mg/dL
Ketones, ur: NEGATIVE mg/dL
Nitrite: NEGATIVE
Protein, ur: 30 mg/dL — AB
Specific Gravity, Urine: 1.011 (ref 1.005–1.030)
WBC, UA: >50 WBC/hpf — ABNORMAL HIGH (ref 0–5)
pH: 5 (ref 5.0–8.0)

## 2021-09-24 LAB — CBC
HCT: 32.4 % — ABNORMAL LOW (ref 36.0–46.0)
Hemoglobin: 10.9 g/dL — ABNORMAL LOW (ref 12.0–15.0)
MCH: 35.2 pg — ABNORMAL HIGH (ref 26.0–34.0)
MCHC: 33.6 g/dL (ref 30.0–36.0)
MCV: 104.5 fL — ABNORMAL HIGH (ref 80.0–100.0)
Platelets: 123 10*3/uL — ABNORMAL LOW (ref 150–400)
RBC: 3.1 MIL/uL — ABNORMAL LOW (ref 3.87–5.11)
RDW: 14.6 % (ref 11.5–15.5)
WBC: 11.6 10*3/uL — ABNORMAL HIGH (ref 4.0–10.5)
nRBC: 0 % (ref 0.0–0.2)

## 2021-09-24 LAB — RENAL FUNCTION PANEL
Albumin: 3 g/dL — ABNORMAL LOW (ref 3.5–5.0)
Anion gap: 7 (ref 5–15)
BUN: 45 mg/dL — ABNORMAL HIGH (ref 8–23)
CO2: 28 mmol/L (ref 22–32)
Calcium: 8 mg/dL — ABNORMAL LOW (ref 8.9–10.3)
Chloride: 98 mmol/L (ref 98–111)
Creatinine, Ser: 2.92 mg/dL — ABNORMAL HIGH (ref 0.44–1.00)
GFR, Estimated: 15 mL/min — ABNORMAL LOW (ref >60)
Glucose, Bld: 157 mg/dL — ABNORMAL HIGH (ref 70–99)
Phosphorus: 4.7 mg/dL — ABNORMAL HIGH (ref 2.5–4.6)
Potassium: 3.5 mmol/L (ref 3.5–5.1)
Sodium: 133 mmol/L — ABNORMAL LOW (ref 135–145)

## 2021-09-24 LAB — GLUCOSE, CAPILLARY
Glucose-Capillary: 154 mg/dL — ABNORMAL HIGH (ref 70–99)
Glucose-Capillary: 183 mg/dL — ABNORMAL HIGH (ref 70–99)

## 2021-09-24 MED ORDER — OXYBUTYNIN CHLORIDE 5 MG PO TABS
2.5000 mg | ORAL_TABLET | Freq: Once | ORAL | Status: DC
  Filled 2021-09-24: qty 0.5

## 2021-09-24 MED ORDER — AZITHROMYCIN 250 MG PO TABS: 250.0000 mg | ORAL_TABLET | Freq: Every day | ORAL | 0 refills | Status: DC

## 2021-09-24 MED ORDER — OXYBUTYNIN CHLORIDE 5 MG PO TABS: 2.5000 mg | ORAL_TABLET | Freq: Every day | ORAL | 0 refills | Status: AC | PRN

## 2021-09-24 MED ORDER — PREDNISONE 20 MG PO TABS
40.0000 mg | ORAL_TABLET | Freq: Every day | ORAL | 0 refills | Status: AC
Start: 2021-09-24 — End: 2021-09-26

## 2021-09-24 NOTE — TOC Transition Note (Addendum)
Transition of Care [facility]) - CM/SW Discharge Note   Patient Details  Name: Dawn Bradford MRN: [id number] Date of Birth: 1936-07-09  [facility] CM/SW Contact:  Magnus Ivan, LCSW Phone Number: 09/24/2021, 2:26 PM   Clinical Narrative:   Patient to DC home today. Spoke to daughter [name] who confirmed DC plan and stated family will pick patient up in her handicap [place] today when DC. Deanna stated they don't have any portable o2 tanks at home since patient was previously only on o2 at night, so will need some. Notified Malachy Mood with [facility] of DC. Referral made to [facility] with [place] per MD request. Thedore Mins with [facility] confirmed patient's home o2 is through them, and they are aware she will now be changed to continuous oxygen. Zach with [employer] aware they need to deliver portable tanks prior to DC today.   3:34- Notified by Thedore Mins with [employer] that they need ambulatory sats and continuous added to the home o2 order. Notified MD and RN.  4:03- Per RN, patient did not qualify for continuous o2 after o2 sat test. Updated [employer].   Final next level of care: Home w Home Health Services Barriers to Discharge: Barriers Resolved   Patient Goals and CMS Choice   CMS Medicare.gov Compare Post Acute Care list provided to:: Patient Represenative (must comment) (daughter [name]) Choice offered to / list presented to : Adult Children  Discharge Placement                       Discharge Plan and Services                          HH Arranged: RN [facility] Agency: [place] Date [facility] Agency Contacted: 09/24/21 Time [place]: 1216 Representative spoke with at [place]: [place] ([place]) Interventions     Readmission Risk Interventions No flowsheet data found.

## 2021-09-24 NOTE — Discharge Summary (Signed)
Physician Discharge Summary   Patient: Dawn Bradford MRN: [id number] DOB: November 14, 1935  Admit date:     09/21/2021  Discharge date: 09/24/21  Discharge Physician: Edwin Dada   PCP: [facility]   Recommendations at discharge:  Follow up with PCP in 1 week PCP: Please follow urine culture Follow up with [place] in 3 days for leaking foley Samantha Vaillancourt: Please review urine culture results from hospital and treat if still leaking urine and culture positive [facility] to follow at home       Discharge Diagnoses Principal Problem:   Acute on chronic respiratory failure with hypoxia ([place]) Active Problems:   Hyperlipidemia   Chronic depression   Essential hypertension   CAD, ARTERY BYPASS GRAFT   ESRD on dialysis ([place])   Acquired hypothyroidism   Chronic cerebrovascular accident (CVA)   Anemia due to end stage renal disease ([place])   Acute on chronic diastolic CHF (congestive heart failure) ([place])   Acute pulmonary edema (HCC)   COPD exacerbation (HCC)   Elevated troponin   Sacral decubitus ulcer, stage II [facility])     Hospital Course   Dawn Bradford is an 86 y.o. F with dementia, lives at home, ESRD on HD MWF, HTN, COPD, hypothyroidism, CAD, CABG, dCHF, aortic valvular disease, and PVD who presented with shortness of breath and hypoxia with O2 saturation of 86% on room air.  Also concern for possible aspiration when she vomited her breakfast the morning prior to her admission.   In the ER, she was started on IV steroids, bronchodilators, p.o. Lasix 80 mg daily, for suspected COPD exacerbation and fluid overload.     Acute on chronic respiratory failure with hypoxia [facility]):  Acute on chronic diastolic CHF (congestive heart failure):  COPD/asthma exacerbation:  Multifactorial given her chronic lung disease, chronic respiratory failure, chronic heart failure and and chronic renal failure.    Nephrology were  consulted, and reduced her dry weight from 80.5kg to 75kg with serial HD.  She was also treated with steroids, azithromycin and her symptoms returned to her baseline.      Leaking foley Patient noted to have leaking around foley while in the hospital.  Foley replaced and still with leaking. Discused with Urology, who recommended antispasmodics (prescribed low dose oxybutynin, family will use hyoscyamine instead, which she has tolerated in the past when used for bladder spasms)  Also collected UA and urine culture, to be followed up outpatient.  At present, she has no systemic symptoms of UTI, and I recommend targeted treatment when urine culture results return.     Chronic depression and anxiety   Essential hypertension   CAD, ARTERY BYPASS GRAFT and elevated troponin:  Non-MI elevated troponin, likely due to poor clearance.   ESRD on dialysis (MWF)  Acquired hypothyroidism   Cerebrovascular disease   Anemia due to end stage renal disease [facility]):   Sacral decubitus ulcer, stage II ([place])   Hypokalemia Resolved post hemodialysis   Anemia of chronic disease in the setting of ESRD   Obesity BMI  30            Consultants: Nephrology   Disposition: Home Diet recommendation: Cardiac diet  DISCHARGE MEDICATION: Allergies as of 09/24/2021       Reactions   Ace Inhibitors Other (See Comments)   Reaction:  Unknown    Amlodipine Other (See Comments)   Reaction:  Unknown    Clonidine Hydrochloride Other (See Comments)   Reaction:  Unknown  Codeine Hives   Latex Hives   Metformin Nausea And Vomiting   Tape Rash        Medication List     STOP taking these medications    b complex-vitamin c-folic acid 0.8 MG Tabs tablet   budesonide 0.25 MG/2ML nebulizer solution Commonly known as: PULMICORT   feeding supplement (PRO-STAT SUGAR FREE 64) Liqd   ferrous fumarate-iron polysaccharide complex 162-115.2 MG Caps capsule Commonly known as: TANDEM    ferrous sulfate 325 (65 FE) MG tablet   melatonin 5 MG Tabs   methocarbamol 500 MG tablet Commonly known as: ROBAXIN   omeprazole 10 MG capsule Commonly known as: PRILOSEC   traMADol 50 MG tablet Commonly known as: ULTRAM   traZODone 100 MG tablet Commonly known as: DESYREL   UNABLE TO FIND   Vitamin D3 125 MCG (5000 UT) Tabs       TAKE these medications    acetaminophen 325 MG tablet Commonly known as: TYLENOL Take 2 tablets (650 mg total) by mouth every 6 (six) hours as needed for mild pain, fever or headache (or Fever >/= 101).   albuterol 108 (90 Base) MCG/ACT inhaler Commonly known as: VENTOLIN HFA Inhale 2 puffs into the lungs every 6 (six) hours as needed for wheezing or shortness of breath.   aspirin EC 81 MG tablet Take 81 mg by mouth daily.   azithromycin 250 MG tablet Commonly known as: ZITHROMAX Take 1 tablet (250 mg total) by mouth daily.   carvedilol 25 MG tablet Commonly known as: COREG Take 25 mg by mouth 2 (two) times daily. Patient is to skip morning dose on Monday, Wednesday and Friday   clonazePAM 0.5 MG tablet Commonly known as: KLONOPIN Take 0.5 mg by mouth 2 (two) times daily as needed for anxiety.   DERMACLOUD EX Apply liberal amount topically to area of skin irritation as needed.  OK to leave at bedside   FLUoxetine 20 MG capsule Commonly known as: PROZAC Take 40 mg by mouth daily.   fluticasone 50 MCG/ACT nasal spray Commonly known as: FLONASE Place 1 spray into the nose daily as needed for allergies.   furosemide 80 MG tablet Commonly known as: LASIX Give 1 tablet by mouth daily on Sunday, Tuesday and Saturday What changed: Another medication with the same name was removed. Continue taking this medication, and follow the directions you see here.   hyoscyamine 0.125 MG Tbdp disintergrating tablet Commonly known as: ANASPAZ Take 0.125 mg by mouth every 4 (four) hours.   isosorbide dinitrate 5 MG tablet Commonly known as:  ISORDIL Take 5 mg by mouth at bedtime.   levothyroxine 75 MCG tablet Commonly known as: SYNTHROID Take 75 mcg by mouth daily.   lidocaine-prilocaine cream Commonly known as: EMLA Apply 1 application topically as needed (for pain).   midodrine 5 MG tablet Commonly known as: PROAMATINE Take 1 tablet (5 mg total) by mouth 2 (two) times daily with a meal.   NON FORMULARY Diet Type:  Renal, NAS, NCS, 1200 cc fluid restriction   nystatin powder Generic drug: nystatin Apply 1 g topically as needed (for irritation).   ondansetron 4 MG disintegrating tablet Commonly known as: ZOFRAN-ODT Take 4 mg by mouth every 8 (eight) hours as needed for nausea or vomiting.   oxybutynin 5 MG tablet Commonly known as: DITROPAN Take 0.5 tablets (2.5 mg total) by mouth daily as needed for bladder spasms.   OXYGEN Inhale 2 L/min into the lungs continuous.   polyethylene glycol 17  g packet Commonly known as: MIRALAX / GLYCOLAX Take 17 g by mouth daily.   predniSONE 20 MG tablet Commonly known as: DELTASONE Take 2 tablets (40 mg total) by mouth daily for 2 days.   rosuvastatin 40 MG tablet Commonly known as: CRESTOR Take 40 mg by mouth daily.   Sennosides-Docusate Sodium 8.6-50 MG Caps Take 2 capsules by mouth 2 (two) times daily as needed.   telmisartan 80 MG tablet Commonly known as: MICARDIS Take 40 mg by mouth daily. At night   triamcinolone cream 0.5 % Commonly known as: KENALOG Apply 1 application topically as needed (for irritation).               Durable Medical Equipment  (From admission, onward)           Start     Ordered   09/24/21 1546  DME Oxygen  Once       Question Answer Comment  Length of Need Lifetime   Mode or (Route) Nasal cannula   Liters per Minute 2   Frequency Continuous (stationary and portable oxygen unit needed)   Oxygen delivery system Gas      09/24/21 1545              Discharge Care Instructions  (From admission, onward)            Start     Ordered   09/24/21 0000  Discharge wound care:       Comments: Cover with a barrier foam dressing, have [place] evaluate   09/24/21 1440            Follow-up Information     [place], [facility]. Schedule an appointment as soon as possible for a visit in 1 week(s).   Contact information: [place] [address] [phone]                Discharge Instructions     Discharge instructions   Complete by: As directed    From Dr. Loleta Books: You were evaluated for coughing, wheezing and low oxygen levels.  In your mother's case, because of her complicated combination of chronic lung disease, chronic kidney disease and chronic congestive heart failure, it is very difficult to separate out the cause of symptoms like these when she has them.  If you have heard different doctors pinpoint different causes of the problem at different times, this is why.  Most likely, it is a combination of all three.    Here, certainly, we treated it as if was all three.  For the chronic lung disease, we treated with steroids to break down lung inflammation and azithromycin (an antibiotic) to reduce cough. Take prednisone and azithromycin for 2 more days  The Chest x-ray showed chronic changes to her lungs which could be thickening and coarseness of the lungs (that can't be specifically treated but is just caused by aging and chronic disease of the lugns) but could also be fluid.  We also treated the kidney disease and heart failure by lowering her "dry weight" from 80.5 kg closer to 75 kg, which is a lot lower.  Use oxygen all the time from now on  Go see your primary doctor in 1 week  Resume all your home medicines.   The following medicines were in our list, but our medication reconciliation tech thought you said that you were not taking them.  If there are any medicines in this list that you are still taking,  discuss with your primary care  doctor: Nephrovite Pulmicort Pro-stat Vitamin D3 Tandem Omeprazole/Prilosec Tramadol Trazodone   For the urine leaking: Use a diaper Call [place] on Monday for an appointment as soon as they can get you in To prevent bladder spasms, use oxybutynin 2.5 mg up to once daily in the morning   Discharge wound care:   Complete by: As directed    Cover with a barrier foam dressing, have Home Health nurse evaluate   Increase activity slowly   Complete by: As directed         Discharge Exam: Filed Weights   09/24/21 0142 09/24/21 0921 09/24/21 1302  Weight: 78.8 kg 82.5 kg 75.7 kg   General: Pt is alert, awake, not in acute distress, baseline has advanced dementia Cardiovascular: RRR, nl S1-S2, no murmurs appreciated.   No LE edema.   Respiratory: Normal respiratory rate and rhythm.  CTAB without rales or wheezes.  Nasal cannula in place Abdominal: Abdomen soft and non-tender.  No distension or HSM.   Neuro/Psych: Strength symmetric in upper and lower extremities.  Judgment and insight appear impaired.   Condition at discharge: stable  The results of significant diagnostics from this hospitalization (including imaging, microbiology, ancillary and laboratory) are listed below for reference.   Imaging Studies: DG Chest Port 1 View  Result Date: 09/21/2021 CLINICAL DATA:  Shortness of breath. EXAM: PORTABLE CHEST 1 VIEW COMPARISON:  12/03/2020 FINDINGS: Prior median sternotomy. Heart size is normal. There is slight increase in interstitial markings throughout the lungs bilaterally, consistent with interstitial edema. More focal opacity at the LEFT lung base likely represents scarring. No discrete consolidations. IMPRESSION: Increased interstitial markings consistent with edema. Electronically Signed   By: Nolon Nations M.D.   On: 09/21/2021 11:45   ECHOCARDIOGRAM COMPLETE  Result Date: 09/22/2021    ECHOCARDIOGRAM REPORT   Patient Name:   TAYLEN WENDLAND Elsey  Date of Exam: 09/22/2021 Medical Rec #:  [id number]    Height:       63.0 in Accession #:    [id number]   Weight:       172.2 lb Date of Birth:  [birth year]    BSA:          1.814 m Patient Age:    86 years     BP:           132/50 mmHg Patient Gender: F            HR:           71 bpm. Exam Location:  [facility] Procedure: 2D Echo, Color Doppler and Cardiac Doppler Indications:     I50.31 congestive heart failure-Acute Diastolic  History:         Patient has prior history of Echocardiogram examinations. CAD,                  Prior CABG, COPD, ESRD and Stroke; Risk Factors:Diabetes and                  Sleep Apnea.  Sonographer:     Charmayne Sheer Referring Phys:  [id number] Soledad Gerlach NIU Diagnosing Phys: Serafina Royals MD  Sonographer Comments: No subcostal window. IMPRESSIONS  1. Left ventricular ejection fraction, by estimation, is 60 to 65%. The left ventricle has normal function. The left ventricle has no regional wall motion abnormalities. Left ventricular diastolic parameters were normal.  2. Right ventricular systolic function is normal. The right ventricular size is normal.  3. Left atrial size was  moderately dilated.  4. The mitral valve is degenerative. Moderate mitral valve regurgitation.  5. The aortic valve is normal in structure. Aortic valve regurgitation is trivial. FINDINGS  Left Ventricle: Left ventricular ejection fraction, by estimation, is 60 to 65%. The left ventricle has normal function. The left ventricle has no regional wall motion abnormalities. The left ventricular internal cavity size was normal in size. There is  no left ventricular hypertrophy. Left ventricular diastolic parameters were normal. Right Ventricle: The right ventricular size is normal. No increase in right ventricular wall thickness. Right ventricular systolic function is normal. Left Atrium: Left atrial size was moderately dilated. Right Atrium: Right atrial size was normal in size. Pericardium: There is no evidence of pericardial effusion.  Mitral Valve: The mitral valve is degenerative in appearance. Moderate mitral valve regurgitation. MV peak gradient, 7.4 mmHg. The mean mitral valve gradient is 3.0 mmHg. Tricuspid Valve: The tricuspid valve is normal in structure. Tricuspid valve regurgitation is mild. Aortic Valve: The aortic valve is normal in structure. Aortic valve regurgitation is trivial. Aortic valve mean gradient measures 7.0 mmHg. Aortic valve peak gradient measures 14.1 mmHg. Aortic valve area, by VTI measures 1.88 cm. Pulmonic Valve: The pulmonic valve was normal in structure. Pulmonic valve regurgitation is not visualized. Aorta: The aortic root and ascending aorta are structurally normal, with no evidence of dilitation. IAS/Shunts: No atrial level shunt detected by color flow Doppler.  LEFT VENTRICLE PLAX 2D LVIDd:         4.56 cm   Diastology LVIDs:         2.62 cm   LV e' medial:    6.09 cm/s LV PW:         1.06 cm   LV E/e' medial:  21.3 LV IVS:        0.95 cm   LV e' lateral:   8.70 cm/s LVOT diam:     2.00 cm   LV E/e' lateral: 14.9 LV SV:         75 LV SV Index:   41 LVOT Area:     3.14 cm  RIGHT VENTRICLE RV Basal diam:  3.07 cm RV S prime:     8.59 cm/s LEFT ATRIUM             Index        RIGHT ATRIUM           Index LA diam:        5.10 cm 2.81 cm/m   RA Area:     13.60 cm LA Vol (A2C):   69.2 ml 38.14 ml/m  RA Volume:   29.40 ml  16.20 ml/m LA Vol (A4C):   63.2 ml 34.83 ml/m LA Biplane Vol: 70.7 ml 38.97 ml/m  AORTIC VALVE                     PULMONIC VALVE AV Area (Vmax):    1.72 cm      PV Vmax:       1.01 m/s AV Area (Vmean):   1.79 cm      PV Vmean:      67.000 cm/s AV Area (VTI):     1.88 cm      PV VTI:        0.239 m AV Vmax:           188.00 cm/s   PV Peak grad:  4.1 mmHg AV Vmean:          117.000 cm/s  PV Mean grad:  2.0 mmHg AV VTI:            0.399 m AV Peak Grad:      14.1 mmHg AV Mean Grad:      7.0 mmHg LVOT Vmax:         103.00 cm/s LVOT Vmean:        66.600 cm/s LVOT VTI:          0.239 m LVOT/AV  VTI ratio: 0.60  AORTA Ao Root diam: 3.00 cm MITRAL VALVE                TRICUSPID VALVE MV Area (PHT): 3.00 cm     TR Peak grad:   25.2 mmHg MV Area VTI:   1.63 cm     TR Vmax:        251.00 cm/s MV Peak grad:  7.4 mmHg MV Mean grad:  3.0 mmHg     SHUNTS MV Vmax:       1.36 m/s     Systemic VTI:  0.24 m MV Vmean:      87.5 cm/s    Systemic Diam: 2.00 cm MV Decel Time: 253 msec MV E velocity: 130.00 cm/s MV A velocity: 113.00 cm/s MV E/A ratio:  1.15 Serafina Royals MD Electronically signed by Serafina Royals MD Signature Date/Time: 09/22/2021/12:47:20 PM    Final     Microbiology: Results for orders placed or performed during the hospital encounter of 09/21/21  Resp Panel by RT-PCR (Flu A&B, Covid) Nasopharyngeal Swab     Status: None   Collection Time: 09/21/21 11:26 AM   Specimen: Nasopharyngeal Swab; Nasopharyngeal(NP) swabs in vial transport medium  Result Value Ref Range Status   SARS Coronavirus 2 by RT PCR NEGATIVE NEGATIVE Final    Comment: (NOTE) SARS-CoV-2 target nucleic acids are NOT DETECTED.  The SARS-CoV-2 RNA is generally detectable in upper respiratory specimens during the acute phase of infection. The lowest concentration of SARS-CoV-2 viral copies this assay can detect is 138 copies/mL. A negative result does not preclude SARS-Cov-2 infection and should not be used as the sole basis for treatment or other patient management decisions. A negative result may occur with  improper specimen collection/handling, submission of specimen other than nasopharyngeal swab, presence of viral mutation(s) within the areas targeted by this assay, and inadequate number of viral copies(<138 copies/mL). A negative result must be combined with clinical observations, patient history, and epidemiological information. The expected result is Negative.  Fact Sheet for Patients:  EntrepreneurPulse.com.au  Fact Sheet for Healthcare Providers:   IncredibleEmployment.be  This test is no t yet approved or cleared by the [place] FDA and  has been authorized for detection and/or diagnosis of SARS-CoV-2 by FDA under an Emergency Use Authorization (EUA). This EUA will remain  in effect (meaning this test can be used) for the duration of the COVID-19 declaration under Section 564(b)(1) of the Act, 21 U.S.C.section 360bbb-3(b)(1), unless the authorization is terminated  or revoked sooner.       Influenza A by PCR NEGATIVE NEGATIVE Final   Influenza B by PCR NEGATIVE NEGATIVE Final    Comment: (NOTE) The Xpert Xpress SARS-CoV-2/FLU/RSV plus assay is intended as an aid in the diagnosis of influenza from Nasopharyngeal swab specimens and should not be used as a sole basis for treatment. Nasal washings and aspirates are unacceptable for Xpert Xpress SARS-CoV-2/FLU/RSV testing.  Fact Sheet for Patients: EntrepreneurPulse.com.au  Fact Sheet for Healthcare Providers: IncredibleEmployment.be  This test is not yet  approved or cleared by the [place] and has been authorized for detection and/or diagnosis of SARS-CoV-2 by FDA under an Emergency Use Authorization (EUA). This EUA will remain in effect (meaning this test can be used) for the duration of the COVID-19 declaration under Section 564(b)(1) of the Act, 21 U.S.C. section 360bbb-3(b)(1), unless the authorization is terminated or revoked.  Performed at [facility], [address] [place], [place] [address]     Labs: CBC: Recent Labs  Lab 09/21/21 1126 09/24/21 0731  WBC 7.2 11.6*  NEUTROABS 5.2  --   HGB 11.2* 10.9*  HCT 34.3* 32.4*  MCV 107.2* 104.5*  PLT 134* 162*   Basic Metabolic Panel: Recent Labs  Lab 09/21/21 1126 09/22/21 0713 09/23/21 0756 09/24/21 0731  NA 137 134* 133* 133*  K 3.9 3.3* 3.7 3.5  CL 102 98 95* 98  CO2 26 27 28 28   GLUCOSE 170* 193* 180* 157*  BUN 11  19 22  45*  CREATININE 2.04* 2.53* 2.23* 2.92*  CALCIUM 8.8* 8.7* 8.5* 8.0*  PHOS  --  3.6 4.3 4.7*   Liver Function Tests: Recent Labs  Lab 09/21/21 1126 09/23/21 0756 09/24/21 0731  AST 21  --   --   ALT 18  --   --   ALKPHOS 93  --   --   BILITOT 0.8  --   --   PROT 6.1*  --   --   ALBUMIN 3.0* 3.1* 3.0*   CBG: Recent Labs  Lab 09/23/21 1140 09/23/21 1646 09/23/21 2038 09/24/21 0813 09/24/21 1343  GLUCAP 146* 195* 162* 154* 183*    Discharge time spent: 60 minutes.  Signed: Edwin Dada, MD [facility] 09/24/2021

## 2021-09-24 NOTE — Progress Notes (Signed)
[facility]  ROUNDING NOTE   Subjective:   Dawn Bradford is an 86 year old Caucasian female with past medical conditions including COPD, GERD, CHF, dementia, end-stage renal disease on hemodialysis.  Patient presents to the emergency department with complaints of shortness of breath and altered mental status from baseline.  Patient has been admitted for Acute pulmonary edema (HCC) [J81.0] COPD exacerbation (HCC) [J44.1] Acute respiratory failure with hypoxia (HCC) [J96.01] Acute on chronic diastolic CHF (congestive heart failure) ([place]) [I50.33] Acute on chronic respiratory failure with hypoxia ([place]) [J96.21]  Patient is known to our clinic and receives outpatient dialysis at [facility] on a MWF schedule supervised by Dr. Candiss Norse.   Patient seen and evaluated during dialysis   HEMODIALYSIS FLOWSHEET:  Blood Flow Rate (mL/min): 350 mL/min Arterial Pressure (mmHg): -170 mmHg Venous Pressure (mmHg): 180 mmHg Transmembrane Pressure (mmHg): 70 mmHg Ultrafiltration Rate (mL/min): 670 mL/min Dialysate Flow Rate (mL/min): 500 ml/min Conductivity: Machine : 14 Conductivity: Machine : 14 Dialysis Fluid Bolus: Normal Saline Bolus Amount (mL): 200 mL  No complaints at this time Resting quietly during treatment  Objective:  Vital signs in last 24 hours:  Temp:  [97.5 F (36.4 C)-98.3 F (36.8 C)] 97.5 F (36.4 C) (01/20 0921) Pulse Rate:  [66-98] 72 (01/20 1130) Resp:  [17-21] 20 (01/20 1130) BP: (102-165)/(39-83) 104/49 (01/20 1130) SpO2:  [94 %-99 %] 95 % (01/20 1130) Weight:  [78.8 kg-82.5 kg] 82.5 kg (01/20 0921)  Weight change: 0.4 kg Filed Weights   09/23/21 0310 09/24/21 0142 09/24/21 0921  Weight: 77.8 kg 78.8 kg 82.5 kg    Intake/Output: I/O last 3 completed shifts: In: 480 [P.O.:480] Out: 1000 [Urine:1000]   Intake/Output this shift:  No intake/output data recorded.  Physical Exam: General: NAD, resting in bed  Head: Normocephalic,  atraumatic. Moist oral mucosal membranes  Eyes: Anicteric  Lungs:  Clear bilaterally, normal effort, 2L [place]  Heart: Regular rate and rhythm  Abdomen:  Soft, nontender  Extremities: Trace peripheral edema.  Neurologic: Nonfocal, moving all four extremities  Skin: No lesions  Access: Left aVF    Basic Metabolic Panel: Recent Labs  Lab 09/21/21 1126 09/22/21 0713 09/23/21 0756 09/24/21 0731  NA 137 134* 133* 133*  K 3.9 3.3* 3.7 3.5  CL 102 98 95* 98  CO2 26 27 28 28   GLUCOSE 170* 193* 180* 157*  BUN 11 19 22  45*  CREATININE 2.04* 2.53* 2.23* 2.92*  CALCIUM 8.8* 8.7* 8.5* 8.0*  PHOS  --  3.6 4.3 4.7*     Liver Function Tests: Recent Labs  Lab 09/21/21 1126 09/23/21 0756 09/24/21 0731  AST 21  --   --   ALT 18  --   --   ALKPHOS 93  --   --   BILITOT 0.8  --   --   PROT 6.1*  --   --   ALBUMIN 3.0* 3.1* 3.0*    No results for input(s): LIPASE, AMYLASE in the last 168 hours. No results for input(s): AMMONIA in the last 168 hours.  CBC: Recent Labs  Lab 09/21/21 1126 09/24/21 0731  WBC 7.2 11.6*  NEUTROABS 5.2  --   HGB 11.2* 10.9*  HCT 34.3* 32.4*  MCV 107.2* 104.5*  PLT 134* 123*     Cardiac Enzymes: No results for input(s): CKTOTAL, CKMB, CKMBINDEX, TROPONINI in the last 168 hours.  BNP: Invalid input(s): POCBNP  CBG: Recent Labs  Lab 09/23/21 0728 09/23/21 1140 09/23/21 1646 09/23/21 2038 09/24/21 0813  GLUCAP 192* [place]     Microbiology: Results for orders placed or performed during the hospital encounter of 09/21/21  Resp Panel by RT-PCR (Flu A&B, Covid) Nasopharyngeal Swab     Status: None   Collection Time: 09/21/21 11:26 AM   Specimen: Nasopharyngeal Swab; Nasopharyngeal(NP) swabs in vial transport medium  Result Value Ref Range Status   SARS Coronavirus 2 by RT PCR NEGATIVE NEGATIVE Final    Comment: (NOTE) SARS-CoV-2 target nucleic acids are NOT DETECTED.  The SARS-CoV-2 RNA is generally detectable in upper  respiratory specimens during the acute phase of infection. The lowest concentration of SARS-CoV-2 viral copies this assay can detect is 138 copies/mL. A negative result does not preclude SARS-Cov-2 infection and should not be used as the sole basis for treatment or other patient management decisions. A negative result may occur with  improper specimen collection/handling, submission of specimen other than nasopharyngeal swab, presence of viral mutation(s) within the areas targeted by this assay, and inadequate number of viral copies(<138 copies/mL). A negative result must be combined with clinical observations, patient history, and epidemiological information. The expected result is Negative.  Fact Sheet for Patients:  EntrepreneurPulse.com.au  Fact Sheet for Healthcare Providers:  IncredibleEmployment.be  This test is no t yet approved or cleared by the [place] FDA and  has been authorized for detection and/or diagnosis of SARS-CoV-2 by FDA under an Emergency Use Authorization (EUA). This EUA will remain  in effect (meaning this test can be used) for the duration of the COVID-19 declaration under Section 564(b)(1) of the Act, 21 U.S.C.section 360bbb-3(b)(1), unless the authorization is terminated  or revoked sooner.       Influenza A by PCR NEGATIVE NEGATIVE Final   Influenza B by PCR NEGATIVE NEGATIVE Final    Comment: (NOTE) The Xpert Xpress SARS-CoV-2/FLU/RSV plus assay is intended as an aid in the diagnosis of influenza from Nasopharyngeal swab specimens and should not be used as a sole basis for treatment. Nasal washings and aspirates are unacceptable for Xpert Xpress SARS-CoV-2/FLU/RSV testing.  Fact Sheet for Patients: EntrepreneurPulse.com.au  Fact Sheet for Healthcare Providers: IncredibleEmployment.be  This test is not yet approved or cleared by the [place] FDA and has been  authorized for detection and/or diagnosis of SARS-CoV-2 by FDA under an Emergency Use Authorization (EUA). This EUA will remain in effect (meaning this test can be used) for the duration of the COVID-19 declaration under Section 564(b)(1) of the Act, 21 U.S.C. section 360bbb-3(b)(1), unless the authorization is terminated or revoked.  Performed at [facility], [address] [place], [place] [address]     Coagulation Studies: No results for input(s): LABPROT, INR in the last 72 hours.  Urinalysis: No results for input(s): COLORURINE, LABSPEC, PHURINE, GLUCOSEU, HGBUR, BILIRUBINUR, KETONESUR, PROTEINUR, UROBILINOGEN, NITRITE, LEUKOCYTESUR in the last 72 hours.  Invalid input(s): APPERANCEUR    Imaging: No results found.   Medications:      aspirin EC  81 mg Oral Daily   azithromycin  250 mg Oral Daily   carvedilol  25 mg Oral Q supper   And   carvedilol  25 mg Oral Once per day on Sun Tue Thu Sat   Chlorhexidine Gluconate Cloth  6 each Topical Q0600   cholecalciferol  5,000 Units Oral Daily   fluconazole  100 mg Oral Daily   FLUoxetine  40 mg Oral Daily   furosemide  80 mg Oral Daily   Gerhardt's butt cream   Topical Q4H  heparin  5,000 Units Subcutaneous Q8H   insulin aspart  0-5 Units Subcutaneous QHS   insulin aspart  0-9 Units Subcutaneous TID WC   ipratropium-albuterol  3 mL Nebulization BID   irbesartan  150 mg Oral Daily   isosorbide dinitrate  5 mg Oral QHS   levothyroxine  75 mcg Oral Daily   melatonin  5 mg Oral QHS   methylPREDNISolone sodium succinate  40 mg Intravenous Daily   polyethylene glycol  17 g Oral Daily   acetaminophen, albuterol, clonazepam, dextromethorphan-guaiFENesin, hydrALAZINE, lidocaine-prilocaine, ondansetron (ZOFRAN) IV, senna-docusate, traMADol  Assessment/ Plan:  Ms. Dawn Bradford is a 86 y.o.  female  past medical conditions including COPD, GERD, CHF, dementia, end-stage renal disease on hemodialysis.  Patient  presents to the emergency department with complaints of shortness of breath and altered mental status from baseline.  Patient has been admitted for Acute pulmonary edema (HCC) [J81.0] COPD exacerbation (HCC) [J44.1] Acute respiratory failure with hypoxia (HCC) [J96.01] Acute on chronic diastolic CHF (congestive heart failure) (HCC) [I50.33] Acute on chronic respiratory failure with hypoxia (HCC) [J96.21]  [facility] [place]/MWF/left aVF/80.5 kg  End-stage renal disease on hemodialysis.  Last treatment received Monday.  We will maintain outpatient schedule if possible. Receiving dialysis with UF goal 2L as tolerated. UF goal reduced due to low BP. Currently attempting 1.5L. Next treatment scheduled for Monday Encourage continued conversation of goals of care due to patient's declining mentation and health  2. Anemia of chronic kidney disease Lab Results  Component Value Date   HGB 10.9 (L) 09/24/2021    Hemoglobin at goal  3. Secondary Hyperparathyroidism:  Lab Results  Component Value Date   PTH 59 08/13/2018   CALCIUM 8.0 (L) 09/24/2021   PHOS 4.7 (H) 09/24/2021    Calcium and phosphorus within acceptable range  4. COPD with asthma exacerbation. Continue supportive therapies with nebs. Prescribed Solumedrol IV.    LOS: 3   1/20/202311:48 AM

## 2021-09-24 NOTE — TOC Initial Note (Addendum)
Transition of Care [facility]) - Initial/Assessment Note    Patient Details  Name: Dawn Bradford MRN: [id number] Date of Birth: 12-24-1935  Transition of Care [facility]) CM/SW Contact:    Alberteen Sam, LCSW Phone Number: 09/24/2021, 12:17 PM  Clinical Narrative:                  CSW spoke with patient's daughter [name], Reports patient was on 2L of O2 at night. Aware that patient is currently on continuous O2, pending RN qualifying note to determine if patient will need to go home with continuous 2L of O2. She is unaware of agency that they are active with for home oxygen, CSW has reached out to [place] with [employer] to inquire if open with them.   Patient is active with [facility] home health, will need home Rn for wound care.   CSW has reached out to [place] with [facility] to see if they are able to accommodate home health RN for wound care. She reports they are able to accomodate her wound care and was informed patient would be discharged today.   Daughter reports at time of discharge patient will leave via private vehicle as they have a handicap van.    Expected Discharge Plan: [place] Barriers to Discharge: Continued Medical Work up   Patient Goals and CMS Choice   CMS Medicare.gov Compare Post Acute Care list provided to:: Patient Represenative (must comment) (daughter [name]) Choice offered to / list presented to : Adult Children  Expected Discharge Plan and Services Expected Discharge Plan: [place] arrangements for the past 2 months: Single Family Home                           HH Arranged: RN [place] Agency: [place] Date [place]: 09/24/21 Time HH Agency Contacted: 1216 Representative spoke with at [place]: [place] Arrangements/Services Living arrangements for the past 2 months: [place] with:: Self   Do you feel safe going back to the place where you live?: Yes                Activities of Daily Living Home Assistive Devices/Equipment: Oxygen ADL Screening (condition at time of admission) Patient's cognitive ability adequate to safely complete daily activities?: No Is the patient deaf or have difficulty hearing?: Yes Does the patient have difficulty seeing, even when wearing glasses/contacts?: No Does the patient have difficulty concentrating, remembering, or making decisions?: Yes Patient able to express need for assistance with ADLs?: Yes Does the patient have difficulty dressing or bathing?: Yes Independently performs ADLs?: No Communication: Dependent Is this a change from baseline?: Pre-admission baseline Dressing (OT): Dependent Is this a change from baseline?: Pre-admission baseline Grooming: Dependent Is this a change from baseline?: Pre-admission baseline Feeding: Dependent Is this a change from baseline?: Pre-admission baseline Bathing: Dependent Is this a change from baseline?: Pre-admission baseline Toileting: Dependent Is this a change from baseline?: Pre-admission baseline In/Out Bed: Dependent Is this a change from baseline?: Pre-admission baseline Walks in Home: Dependent Is this a change from baseline?: Pre-admission baseline Does the patient have difficulty walking or climbing stairs?: No Weakness of Legs: Both Weakness of Arms/Hands: Both  Permission Sought/Granted Permission sought to share information with : Case Manager, Customer service manager, Other (comment)  Emotional Assessment         Alcohol / Substance Use: Not Applicable Psych Involvement: No (comment)  Admission diagnosis:  Acute pulmonary edema (HCC) [J81.0] COPD exacerbation (HCC) [J44.1] Acute respiratory failure with hypoxia (HCC) [J96.01] Acute on chronic diastolic CHF (congestive heart failure) (HCC) [I50.33] Acute on chronic respiratory failure with hypoxia (HCC) [J96.21] Patient Active Problem List   Diagnosis  Date Noted   Acute on chronic diastolic CHF (congestive heart failure) ([place]) 09/21/2021   Acute pulmonary edema ([place]) 09/21/2021   Acute on chronic respiratory failure with hypoxia ([place]) 09/21/2021   COPD exacerbation ([place]) 09/21/2021   Elevated troponin 09/21/2021   Sacral decubitus ulcer, stage II ([place]) 09/21/2021   Pain due to onychomycosis of toenails of both feet 11/28/2019   Pain, generalized 09/12/2018   Palliative care encounter 09/12/2018   Anemia due to end stage renal disease ([place]) 09/05/2018   Cellulitis of right hip 09/01/2018   Hypertensive heart and renal disease with congestive heart failure and end stage renal disease ([place]) 08/23/2018   COPD with asthma ([place]) 08/23/2018   Acquired hypothyroidism 08/23/2018   Dyslipidemia associated with type 2 diabetes mellitus ([place]) 08/23/2018   Chronic kidney disease with end stage renal disease on dialysis due to type 2 diabetes mellitus ([place]) 08/23/2018   Diabetes mellitus type 2 with retinopathy ([place]) 08/23/2018   Controlled type 2 diabetes with renal manifestation 08/23/2018   Chronic cerebrovascular accident (CVA) 08/23/2018   Chronic constipation 08/23/2018   Protein-calorie malnutrition, severe ([place]) 08/23/2018   Closed comminuted intertrochanteric fracture of proximal end of right femur ([place]) 08/23/2018   Closed fracture of right hip with routine healing 08/22/2018   Closed intertrochanteric fracture of hip, right, initial encounter ([place]) 08/11/2018   Hypoxia 10/12/2017   Hx of CABG 10/18/2016   Nonrheumatic aortic valve stenosis 10/18/2016   ESRD on dialysis ([place]) 07/07/2016   Background diabetic retinopathy ([place]) 01/04/2016   Chest pain 08/19/2015   Coronary artery disease involving native heart with angina pectoris ([place]) 08/18/2015   BMI 38.0-38.9,adult 07/07/2015   Cognitive deficit, post-stroke 07/07/2015   H/O ischemic left MCA stroke 02/19/2015   Expressive aphasia 02/03/2015   Carotid stenosis 02/03/2015    Diabetes mellitus type 2 in obese ([place]) 01/31/2015   End stage renal disease ([place]) 01/31/2015   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 03/08/2011   Diabetic glomerulopathy ([place]) 03/08/2011   Edema 03/08/2011   Obstructive chronic bronchitis without exacerbation ([place]) 03/08/2011   Recurrent major depressive disorder, in partial remission ([place]) 03/08/2011   Unspecified cataract 03/08/2011   Solitary pulmonary nodule 03/08/2011   Mild intermittent asthma 03/29/2010   CAD, ARTERY BYPASS GRAFT 03/02/2010   Obesity hypoventilation syndrome ([place]) 02/05/2010   Chronic depression 01/15/2010   Shortness of breath 12/24/2009   CAROTID BRUIT 35/57/3220   DIASTOLIC HEART FAILURE, CHRONIC 02/17/2009   DIABETES MELLITUS, TYPE II 01/29/2009   Hyperlipidemia 01/29/2009   Obstructive sleep apnea 01/29/2009   Essential hypertension 01/29/2009   Aortic valve disorder 01/29/2009   RENAL ARTERY STENOSIS 01/29/2009   RENAL DISEASE, CHRONIC 01/29/2009   PCP:  [facility]:   [facility] [address]), [place] [address] [place]) [place] [address] Phone: [phone] Fax: [place] [facility] [address] [place], [place] [address] Phone: [phone] Fax: [place], [place]  [address] [place]  [address] Phone: [phone] Fax: [phone]     Social Determinants of Health (SDOH) Interventions    Readmission Risk Interventions No flowsheet data found.

## 2021-09-24 NOTE — Hospital Course (Signed)
Dawn Bradford is a 86 y.o. female with medical history significant of ESRD-HD (MWF), hypertension, hyperlipidemia, COPD, asthma, stroke, GERD, hypothyroidism, chronic depression/anxiety, OSA not using CPAP, CAD, CABG, dCHF, aortic valvular disease, anemia, carotid artery stenosis, renal artery stenosis, dementia, who presents with shortness of breath and hypoxia with O2 saturation of 86% on room air.  Also concern for possible aspiration when she vomited her breakfast the morning prior to her admission.  At baseline, patient recognizes family members, but not orientated to time and place.     Work-up reviewed asthma/COPD exacerbation and acute on chronic diastolic CHF.  She was started on IV steroids, bronchodilators, p.o. Lasix 80 mg daily, cardiac medications were resumed.

## 2021-09-24 NOTE — Progress Notes (Signed)
[facility]) Hospital Liaison Note  Notified by Oleh Genin, LCSW [facility] manager of patient/family request for [facility] Palliative services at home after discharge.  [facility] hospital liaison will follow patient for discharge disposition.  Please call with any hospice or outpatient palliative care related questions.  Thank you for the opportunity to participate in this patient's care.  Nadene Rubins, RN, BSN [place] [phone]

## 2021-09-24 NOTE — Progress Notes (Signed)
Dialysis RN called ICU to come see patient complaining of chest pain.  EKG done and Dr. Lesleigh Noe, came to bedside.  MD wants patient to go back to room and Primary RN to observe.

## 2021-09-24 NOTE — Progress Notes (Addendum)
12:34  pt complained of a chest pain PS of 5. UF off, BFR decreased to 250.  12:36 NP shantell was notified by Baldo Daub RN  with order to stop Hd treatment. Hd terminated. Upon blood was returned pt chest pain decreased to 4. ICU nurse  was also called to check on the pt. They did EKG. Attending MD was also notified and came to her bedside. They said pt was okay and will continue to be observed and once pt goes back to her room, DR Loleta Books ordered to Inform her nurse to call him so he can see her again there.

## 2021-09-24 NOTE — Progress Notes (Signed)
Brief Nutrition Education Note  Received consult for RN for renal diet education.   Per RN, pt is very forgetful and family would better benefit from education. Pt was in HD at time of visit and no family in room. RD provided "Food Pyramid for Healthy Eating with Kidney Disease" handout on pt's tray table and attached to AVS/Discharge instructions. Pt also has access to outpatient HD RD.   If further nutrition-related issues are identified, please re-consult RD.   Loistine Chance, RD, LDN, [facility] Registered Dietitian II Certified Diabetes Care and Education Specialist Please refer to [facility] for RD and/or RD on-call/weekend/after hours pager

## 2021-09-27 ENCOUNTER — Emergency Department: Payer: Medicare Other

## 2021-09-27 ENCOUNTER — Emergency Department
Admission: EM | Admit: 2021-09-27 | Discharge: 2021-09-27 | Disposition: A | Discharge status: Home / Self Care | Payer: Medicare Other | Attending: Emergency Medicine | Admitting: Emergency Medicine

## 2021-09-27 ENCOUNTER — Telehealth: Payer: Self-pay

## 2021-09-27 ENCOUNTER — Ambulatory Visit (INDEPENDENT_AMBULATORY_CARE_PROVIDER_SITE_OTHER): Payer: Medicare Other | Admitting: Urology

## 2021-09-27 ENCOUNTER — Encounter: Payer: Self-pay | Admitting: Emergency Medicine

## 2021-09-27 ENCOUNTER — Other Ambulatory Visit: Payer: Self-pay

## 2021-09-27 ENCOUNTER — Encounter: Payer: Self-pay | Admitting: Urology

## 2021-09-27 VITALS — BP 96/63 | HR 79 | Ht 63.0 in | Wt 166.0 lb

## 2021-09-27 DIAGNOSIS — J449 Chronic obstructive pulmonary disease, unspecified: Secondary | ICD-10-CM | POA: Insufficient documentation

## 2021-09-27 DIAGNOSIS — F0390 Unspecified dementia without behavioral disturbance: Secondary | ICD-10-CM | POA: Diagnosis not present

## 2021-09-27 DIAGNOSIS — Z992 Dependence on renal dialysis: Secondary | ICD-10-CM | POA: Diagnosis not present

## 2021-09-27 DIAGNOSIS — N390 Urinary tract infection, site not specified: Secondary | ICD-10-CM | POA: Insufficient documentation

## 2021-09-27 DIAGNOSIS — I509 Heart failure, unspecified: Secondary | ICD-10-CM | POA: Diagnosis not present

## 2021-09-27 DIAGNOSIS — R451 Restlessness and agitation: Secondary | ICD-10-CM | POA: Insufficient documentation

## 2021-09-27 DIAGNOSIS — R339 Retention of urine, unspecified: Secondary | ICD-10-CM | POA: Diagnosis not present

## 2021-09-27 DIAGNOSIS — I2510 Atherosclerotic heart disease of native coronary artery without angina pectoris: Secondary | ICD-10-CM | POA: Insufficient documentation

## 2021-09-27 DIAGNOSIS — I132 Hypertensive heart and chronic kidney disease with heart failure and with stage 5 chronic kidney disease, or end stage renal disease: Secondary | ICD-10-CM | POA: Insufficient documentation

## 2021-09-27 DIAGNOSIS — F03911 Unspecified dementia, unspecified severity, with agitation: Secondary | ICD-10-CM | POA: Diagnosis not present

## 2021-09-27 DIAGNOSIS — R109 Unspecified abdominal pain: Secondary | ICD-10-CM | POA: Diagnosis present

## 2021-09-27 DIAGNOSIS — N186 End stage renal disease: Secondary | ICD-10-CM | POA: Diagnosis not present

## 2021-09-27 DIAGNOSIS — Z466 Encounter for fitting and adjustment of urinary device: Secondary | ICD-10-CM | POA: Diagnosis not present

## 2021-09-27 LAB — COMPREHENSIVE METABOLIC PANEL
ALT: 12 U/L (ref 0–44)
AST: 17 U/L (ref 15–41)
Albumin: 2.8 g/dL — ABNORMAL LOW (ref 3.5–5.0)
Alkaline Phosphatase: 71 U/L (ref 38–126)
Anion gap: 10 (ref 5–15)
BUN: 55 mg/dL — ABNORMAL HIGH (ref 8–23)
CO2: 26 mmol/L (ref 22–32)
Calcium: 7.9 mg/dL — ABNORMAL LOW (ref 8.9–10.3)
Chloride: 99 mmol/L (ref 98–111)
Creatinine, Ser: 2.97 mg/dL — ABNORMAL HIGH (ref 0.44–1.00)
GFR, Estimated: 15 mL/min — ABNORMAL LOW (ref >60)
Glucose, Bld: 177 mg/dL — ABNORMAL HIGH (ref 70–99)
Potassium: 4.3 mmol/L (ref 3.5–5.1)
Sodium: 135 mmol/L (ref 135–145)
Total Bilirubin: 1.7 mg/dL — ABNORMAL HIGH (ref 0.3–1.2)
Total Protein: 5.9 g/dL — ABNORMAL LOW (ref 6.5–8.1)

## 2021-09-27 LAB — URINE CULTURE: Culture: >=100000 — AB

## 2021-09-27 LAB — CBC
HCT: 34.1 % — ABNORMAL LOW (ref 36.0–46.0)
Hemoglobin: 11.2 g/dL — ABNORMAL LOW (ref 12.0–15.0)
MCH: 35.2 pg — ABNORMAL HIGH (ref 26.0–34.0)
MCHC: 32.8 g/dL (ref 30.0–36.0)
MCV: 107.2 fL — ABNORMAL HIGH (ref 80.0–100.0)
Platelets: 66 10*3/uL — ABNORMAL LOW (ref 150–400)
RBC: 3.18 MIL/uL — ABNORMAL LOW (ref 3.87–5.11)
RDW: 14.9 % (ref 11.5–15.5)
WBC: 7.9 10*3/uL (ref 4.0–10.5)
nRBC: 0 % (ref 0.0–0.2)

## 2021-09-27 LAB — AMMONIA: Ammonia: 18 umol/L (ref 9–35)

## 2021-09-27 LAB — BLADDER SCAN AMB NON-IMAGING

## 2021-09-27 LAB — TROPONIN I (HIGH SENSITIVITY): Troponin I (High Sensitivity): 25 ng/L — ABNORMAL HIGH (ref <18)

## 2021-09-27 MED ORDER — IOHEXOL 300 MG/ML  SOLN
80.0000 mL | Freq: Once | INTRAMUSCULAR | Status: AC | PRN
  Administered 2021-09-27: 80 mL via INTRAVENOUS

## 2021-09-27 MED ORDER — CEPHALEXIN 500 MG PO CAPS: 500.0000 mg | ORAL_CAPSULE | Freq: Two times a day (BID) | ORAL | 0 refills | Status: AC

## 2021-09-27 MED ORDER — SODIUM CHLORIDE 0.9 % IV SOLN
1.0000 g | Freq: Once | INTRAVENOUS | Status: AC
  Administered 2021-09-27: 1 g via INTRAVENOUS
  Filled 2021-09-27: qty 10

## 2021-09-27 NOTE — Telephone Encounter (Signed)
Incoming call on triage line from pts daughter Dawn Bradford. Per Dawn Bradford, Dawn Bradford was admitted on 1/17 for SOB, COPD exacerbation.  While inpatient, catheter was leaking so they switched it out. Pt was discharged 1/20 home. She got home, catheter was still leaking. Made appt for home health to come switch cath out again today (09/27/21) and home health came and took cath out, noticed her private area was very raw looking. Home health attempted multiple times to reinsert cath however they were unsuccessful. Daughter is asking if it was ok for cath to be out. Pt is currently at dialysis supposed to be until 3:15 however daughter says they just called and said they were pulling her dialysis due to pt being in pain, though she could only say she is in pain and could not say where or anything ab the pain due to dementia.  Per Dawn Bradford: Tell pt to come to [facility] location.  Advised daughter Dawn Bradford to take Dawn Bradford to [facility] location to be seen asap.

## 2021-09-27 NOTE — ED Provider Notes (Signed)
[facility] Provider Note    Event Date/Time   First MD Initiated Contact with Patient 09/27/21 1724     (approximate)   History   Pain of unknown origin   HPI  Dawn Bradford is a 86 y.o. female with a history of dementia, end-stage renal disease CHF, COPD, CAD, diabetes, hypertension who presents with daughters because of complaints of pain of unknown origin.  She is also been agitated.  Daughter reports patient was recently discharged from the hospital, had been doing better however over the weekend has been complaining of pain but is not specific about where she is hurting.  Went to the was unable to complete dialysis today because of complaints of pain.  Went to the urologist today and apparently was very agitated and uncomfortable and 911 was called.  Was given her home dose of clonazepam which daughters report has made her very sleepy     Physical Exam   Triage Vital Signs: ED Triage Vitals  Enc Vitals Group     BP 09/27/21 1708 119/67     Pulse Rate 09/27/21 1708 68     Resp 09/27/21 1708 20     Temp 09/27/21 1708 98 F (36.7 C)     Temp Source 09/27/21 1708 Axillary     SpO2 09/27/21 1708 (!) 88 %     Weight --      Height 09/27/21 1709 1.6 m (5\' 3" )     Head Circumference --      Peak Flow --      Pain Score --      Pain Loc --      Pain Edu? --      Excl. in [place]? --     Most recent vital signs: Vitals:   09/27/21 1914 09/27/21 2039  BP: (!) 112/58 (!) 149/68  Pulse: 70 63  Resp: 16 16  Temp: 97.9 F (36.6 C)   SpO2: 97% 95%     General: Sleepy but arousable, no distress CV:  Good peripheral perfusion.  Resp:  Normal effort.  Abd:  No distention.  No tenderness to palpation Other:  No bony abnormalities palpated   ED Results / Procedures / Treatments   Labs (all labs ordered are listed, but only abnormal results are displayed) Labs Reviewed  CBC - Abnormal; Notable for the following components:      Result Value   RBC  3.18 (*)    Hemoglobin 11.2 (*)    HCT 34.1 (*)    MCV 107.2 (*)    MCH 35.2 (*)    Platelets 66 (*)    All other components within normal limits  COMPREHENSIVE METABOLIC PANEL - Abnormal; Notable for the following components:   Glucose, Bld 177 (*)    BUN 55 (*)    Creatinine, Ser 2.97 (*)    Calcium 7.9 (*)    Total Protein 5.9 (*)    Albumin 2.8 (*)    Total Bilirubin 1.7 (*)    GFR, Estimated 15 (*)    All other components within normal limits  TROPONIN I (HIGH SENSITIVITY) - Abnormal; Notable for the following components:   Troponin I (High Sensitivity) 25 (*)    All other components within normal limits  AMMONIA     EKG  ED ECG REPORT I, Lavonia Drafts, the attending physician, personally viewed and interpreted this ECG.  Date: 09/27/2021  Rhythm: normal sinus rhythm QRS Axis: normal Intervals: normal ST/T Wave abnormalities: normal  Narrative Interpretation: no evidence of acute ischemia    RADIOLOGY Chest x-ray reviewed by me, no acute abnormality    PROCEDURES:  Critical Care performed:   Procedures   MEDICATIONS ORDERED IN ED: Medications  iohexol (OMNIPAQUE) 300 MG/ML solution 80 mL (80 mLs Intravenous Contrast Given 09/27/21 1853)  cefTRIAXone (ROCEPHIN) 1 g in sodium chloride 0.9 % 100 mL IVPB (0 g Intravenous Stopped 09/27/21 2038)     IMPRESSION / MDM / ASSESSMENT AND PLAN / ED COURSE  I reviewed the triage vital signs and the nursing notes.  Patient with history of dementia presents with pain of unknown origin, she is resting quietly now after receiving clonazepam earlier.  Exam is noncontributory at this time  Daughters are concerned about possible impaction versus abdominal discomfort.  There was also concerned about possible yellowish discoloration of her skin.  Lab work is overall reassuring, bilirubin is very minimally elevated  Elevated creatinine in line with end-stage renal disease  Will send for CT abdomen pelvis  CT  abdomen pelvis without acute abnormality, MRI recommended in 1 year  Reviewed urine culture from several days ago, positive for Klebsiella and E. coli greater than 100,000 CFU's, sensitive to Keflex.  We will give a dose of IV Rocephin here in the emergency department.  This UTI could be the cause of her pain given otherwise reassuring work-up.          FINAL CLINICAL IMPRESSION(S) / ED DIAGNOSES   Final diagnoses:  Lower urinary tract infectious disease     Rx / DC Orders   ED Discharge Orders          Ordered    cephALEXin (KEFLEX) 500 MG capsule  2 times daily        09/27/21 2018             Note:  This document was prepared using Dragon voice recognition software and may include unintentional dictation errors.   Lavonia Drafts, MD 09/27/21 2044

## 2021-09-27 NOTE — Progress Notes (Signed)
09/27/2021 3:56 PM   Dawn Bradford July 20, 1936 [id number]  Referring provider: [facility] [address] [place],  [name] [address]  Chief Complaint  Patient presents with   Follow-up    Cath change   Urological history: 1. Left hydronephrosis -Associated with constipation urinary retention -Not present on CT scan January 2023  2. ESRD -Receives dialysis Monday, Wednesday and Fridays  3. Urinary retention -Managed with indwelling Foley  4. Renal cysts -Contrast CT 09/2021- multiple small right renal cysts   HPI: Dawn Bradford is a 86 y.o. female who presents today as Foley catheter has been removed by home health and they are not able to replace with her daughter, Dawn Bradford.  She was complaining of pain and home health services were attempted to exchange the Foley catheter to see if that would alleviate her discomfort and they were unable to replace the catheter this morning.  She then went on to receive dialysis treatment, but it was abruptly discontinued as she continued to complain of pain and become quite anxious and restless.  She continues to complain of discomfort and she states that she has pain in her chest, pain in her abdomen and pain in her lower back.  Due to her Alzheimer's, she cannot be more specific.  PVR was 11 cc.  She continues to become agitated and restless and repeats "make it stop."  Daughter is overwhelmed and would like EMS called at this time.  PMH: Past Medical History:  Diagnosis Date   ACE-inhibitor cough    Aortic valve disorder    Echo in 2009 showed mean aortic valve gradient of 13 mmHg, suggesting very mild stenosis. Echo (12/10) suggested aortic sclerosis only   Carotid artery disease (HCC)    mild, carotid dopplers 12/2009   CHF (congestive heart failure) (HCC)    COPD (chronic obstructive pulmonary disease) (HCC)    Coronary artery disease    s/p anterior MI in 1996 followed by CABG. Lexiscan myoview (4/11): EF 67%,  normal perfusion with no evidence for ischemia or infarction.    Diabetes mellitus    type 2   Diastolic heart failure    most recent echo (12/10) showed EF 55-60% with mild LVH, grade I diastolic dysfunction, mild LAE.   ESRD (end stage renal disease) on dialysis ([place])    Hyperlipidemia    Hypertension    resistant hyptertension times many years. the patient does have renal artery stenosis. she has tried calcium channel blockers in the past and states that she would not take them now because she had some problems with her gums which her dentist identified as calcium-channel blocker side effects.   Hypothyroidism    Mild asthma    PFT 02/05/10 FEV1 1.38, FEV1% 73, TLC 3.78 (86%), DLCO 48%, +BD   Myocardial infarction (HCC)    Obesity    Obesity hypoventilation syndrome ([place])        Obstructive sleep apnea    PSG 01/05/2006 AHI 24.8, CPAP 9cm H2O   Pulmonary nodule, right    Renal artery stenosis ([place])    The patient has an occluded right renal artery and an atrophic right kidney. There is 20% left renal artery stenosis. This was seen by catheterization in 2007   Stroke [facility])     Surgical History: Past Surgical History:  Procedure Laterality Date   A/V FISTULAGRAM N/A 01/25/2018   Procedure: A/V FISTULAGRAM;  Surgeon: Algernon Huxley, MD;  Location: [place] [facility];  Service: Cardiovascular;  Laterality: N/A;   A/V FISTULAGRAM Left 02/21/2019   Procedure: A/V FISTULAGRAM;  Surgeon: Algernon Huxley, MD;  Location: [place] [facility];  Service: Cardiovascular;  Laterality: Left;   A/V FISTULAGRAM Left 10/24/2019   Procedure: A/V FISTULAGRAM;  Surgeon: Algernon Huxley, MD;  Location: [place] [facility];  Service: Cardiovascular;  Laterality: Left;   ABDOMINAL HYSTERECTOMY  1985   AV FISTULA PLACEMENT     CATARACT EXTRACTION  2010   CORONARY ARTERY BYPASS GRAFT  1996   DIALYSIS FISTULA CREATION     INNER EAR SURGERY  1979   right   INTRAMEDULLARY (IM) NAIL INTERTROCHANTERIC Right  08/17/2018   Procedure: INTRAMEDULLARY (IM) NAIL INTERTROCHANTRIC;  Surgeon: Earnestine Leys, MD;  Location: [facility];  Service: Orthopedics;  Laterality: Right;   PERIPHERAL VASCULAR BALLOON ANGIOPLASTY  02/21/2019   Procedure: PERIPHERAL VASCULAR BALLOON ANGIOPLASTY;  Surgeon: Algernon Huxley, MD;  Location: [place] [facility];  Service: Cardiovascular;;   PERIPHERAL VASCULAR CATHETERIZATION Left 01/06/2015   Procedure: A/V Shuntogram/Fistulagram;  Surgeon: Katha Cabal, MD;  Location: [place] [facility];  Service: Cardiovascular;  Laterality: Left;   PERIPHERAL VASCULAR CATHETERIZATION N/A 02/17/2015   Procedure: Dialysis/Perma Catheter Removal;  Surgeon: Katha Cabal, MD;  Location: [place] [facility];  Service: Cardiovascular;  Laterality: N/A;   PERIPHERAL VASCULAR CATHETERIZATION N/A 01/28/2016   Procedure: A/V Shuntogram/Fistulagram;  Surgeon: Algernon Huxley, MD;  Location: [place] [facility];  Service: Cardiovascular;  Laterality: N/A;   PERIPHERAL VASCULAR CATHETERIZATION N/A 01/28/2016   Procedure: A/V Shunt Intervention;  Surgeon: Algernon Huxley, MD;  Location: [place] [facility];  Service: Cardiovascular;  Laterality: N/A;   TUBAL LIGATION  1973    Home Medications:  Allergies as of 09/27/2021       Reactions   Ace Inhibitors Other (See Comments)   Reaction:  Unknown    Amlodipine Other (See Comments)   Reaction:  Unknown    Clonidine Hydrochloride Other (See Comments)   Reaction:  Unknown    Codeine Hives   Latex Hives   Metformin Nausea And Vomiting   Tape Rash        Medication List        Accurate as of September 27, 2021 11:59 PM. If you have any questions, ask your nurse or doctor.          acetaminophen 325 MG tablet Commonly known as: TYLENOL Take 2 tablets (650 mg total) by mouth every 6 (six) hours as needed for mild pain, fever or headache (or Fever >/= 101).   albuterol 108 (90 Base) MCG/ACT inhaler Commonly known as: VENTOLIN HFA Inhale 2  puffs into the lungs every 6 (six) hours as needed for wheezing or shortness of breath.   aspirin EC 81 MG tablet Take 81 mg by mouth daily.   azithromycin 250 MG tablet Commonly known as: ZITHROMAX Take 1 tablet (250 mg total) by mouth daily.   carvedilol 25 MG tablet Commonly known as: COREG Take 25 mg by mouth 2 (two) times daily. Patient is to skip morning dose on Monday, Wednesday and Friday   cephALEXin 500 MG capsule Commonly known as: KEFLEX Take 1 capsule (500 mg total) by mouth 2 (two) times daily for 7 days.   clonazePAM 0.5 MG tablet Commonly known as: KLONOPIN Take 0.5 mg by mouth 2 (two) times daily as needed for anxiety.   DERMACLOUD EX Apply liberal amount topically to area of skin irritation as  needed.  OK to leave at bedside   FLUoxetine 20 MG capsule Commonly known as: PROZAC Take 40 mg by mouth daily.   fluticasone 50 MCG/ACT nasal spray Commonly known as: FLONASE Place 1 spray into the nose daily as needed for allergies.   furosemide 80 MG tablet Commonly known as: LASIX Give 1 tablet by mouth daily on Sunday, Tuesday and Saturday   hyoscyamine 0.125 MG Tbdp disintergrating tablet Commonly known as: ANASPAZ Take 0.125 mg by mouth every 4 (four) hours.   isosorbide dinitrate 5 MG tablet Commonly known as: ISORDIL Take 5 mg by mouth at bedtime.   levothyroxine 75 MCG tablet Commonly known as: SYNTHROID Take 75 mcg by mouth daily.   lidocaine-prilocaine cream Commonly known as: EMLA Apply 1 application topically as needed (for pain).   midodrine 5 MG tablet Commonly known as: PROAMATINE Take 1 tablet (5 mg total) by mouth 2 (two) times daily with a meal.   NON FORMULARY Diet Type:  Renal, NAS, NCS, 1200 cc fluid restriction   nystatin powder Generic drug: nystatin Apply 1 g topically as needed (for irritation).   ondansetron 4 MG disintegrating tablet Commonly known as: ZOFRAN-ODT Take 4 mg by mouth every 8 (eight) hours as needed for  nausea or vomiting.   oxybutynin 5 MG tablet Commonly known as: DITROPAN Take 0.5 tablets (2.5 mg total) by mouth daily as needed for bladder spasms.   OXYGEN Inhale 2 L/min into the lungs continuous.   polyethylene glycol 17 g packet Commonly known as: MIRALAX / GLYCOLAX Take 17 g by mouth daily.   rosuvastatin 40 MG tablet Commonly known as: CRESTOR Take 40 mg by mouth daily.   Sennosides-Docusate Sodium 8.6-50 MG Caps Take 2 capsules by mouth 2 (two) times daily as needed.   telmisartan 80 MG tablet Commonly known as: MICARDIS Take 40 mg by mouth daily. At night   triamcinolone cream 0.5 % Commonly known as: KENALOG Apply 1 application topically as needed (for irritation).        Allergies:  Allergies  Allergen Reactions   Ace Inhibitors Other (See Comments)    Reaction:  Unknown    Amlodipine Other (See Comments)    Reaction:  Unknown    Clonidine Hydrochloride Other (See Comments)    Reaction:  Unknown    Codeine Hives   Latex Hives   Metformin Nausea And Vomiting   Tape Rash    Family History: Family History  Problem Relation Age of Onset   Kidney failure Mother    Diabetes Mother    Aortic aneurysm Father    Coronary artery disease Father    Heart attack Father    Diabetes Sister    Diabetes Brother    Kidney failure Brother     Social History:  reports that she has never smoked. She has never used smokeless tobacco. She reports that she does not drink alcohol and does not use drugs.  ROS: Pertinent ROS in HPI  Physical Exam: BP 96/63    Pulse 79    Ht 5\' 3"  (1.6 m)    Wt 166 lb (75.3 kg)    LMP 02/01/1986 (Approximate)    BMI 29.41 kg/m   Constitutional:  Well nourished. Alert and oriented, No acute distress. HEENT: [place] AT, mask in place.  Trachea midline Cardiovascular: No clubbing, cyanosis, or edema. Respiratory: Normal respiratory effort, no increased work of breathing. GU: No CVA tenderness.  No bladder fullness or masses.    Neurologic: Grossly intact, no  focal deficits, moving all 4 extremities. Psychiatric: Normal mood and affect.    Laboratory Data: Lab Results  Component Value Date   WBC 7.9 09/27/2021   HGB 11.2 (L) 09/27/2021   HCT 34.1 (L) 09/27/2021   MCV 107.2 (H) 09/27/2021   PLT 66 (L) 09/27/2021    Lab Results  Component Value Date   CREATININE 2.97 (H) 09/27/2021    Lab Results  Component Value Date   HGBA1C 6.2 (H) 09/22/2021    Lab Results  Component Value Date   AST 17 09/27/2021   Lab Results  Component Value Date   ALT 12 09/27/2021  I have reviewed the labs.   Pertinent Imaging:  09/27/21 15:12  Scan Result 49ml   Assessment & Plan:    1. Encounter for Foley catheter replacement -At this time, her PVR is minimal and there is no indication to place a Foley  2. Incomplete bladder emptying - BLADDER SCAN AMB NON-IMAGING -Patient's bladder is empty at this time, but daughter states she has had limited fluid intake today. -She will need further assessment in the future on to whether or not she is still producing enough urine to to warrant to have an indwelling Foley as she receives dialysis 3 days weekly  3.  Agitation -EMS is called as patient's agitation cannot be reduced  Return for Pending ED evaluation .  These notes generated with voice recognition software. I apologize for typographical errors.  Zara Council, PA-C  [facility] [address]  [place], [place] [address] [phone]   I spent 30 minutes on the day of the encounter to include pre-visit record review, face-to-face time with the patient, and post-visit ordering of tests.

## 2021-09-27 NOTE — ED Triage Notes (Signed)
Pt to ED via POV with c/o pain that pt is not able to verbalize where the pain is because of her dementia. She does have an area on her lower back the is sore. She did have a catheter in that came out, home health nurse tried to put it back in but was unable to get it in, family took her to urologist they did an [place] and there was no urine so they did not replace it. Family states that she is looking yellow and this is new for her. She took Clonazepam before coming so pt is lethargic, she awakes to verbal stimuli.

## 2021-09-27 NOTE — ED Triage Notes (Signed)
Arrives from urology via [facility] for c/o generalized pain.  Was at dialysis today and then went to urology after treatment to check bladder fullness.  Patient has indwelling catheter.  Clonazepam about one hour PTA, patient is resting well.  VS wnl.

## 2021-10-27 ENCOUNTER — Telehealth: Payer: Self-pay

## 2021-10-27 NOTE — Telephone Encounter (Signed)
Spoke with Daughter Deanna to schedule palliative consult.  Dawn Bradford is headed out of town currently but can connect with [place] to schedule once she returns.  Deanna asks if we have another daughter's number available to call. This RN called Benjamine Mola listed in telephone record, but Benjamine Mola requests that we await on Deanna to return to schedule.

## 2021-11-05 ENCOUNTER — Telehealth: Payer: Self-pay | Admitting: Nurse Practitioner

## 2021-11-05 NOTE — Telephone Encounter (Signed)
Attempted to contact daughter [name] regarding scheduling Palliative Consult, no answer - left message requesting a return call. ?

## 2021-11-09 ENCOUNTER — Telehealth: Payer: Self-pay | Admitting: Nurse Practitioner

## 2021-11-09 NOTE — Telephone Encounter (Signed)
Returned call to patient's daughter [name] and discussed the Palliative services with her and all questions were answered and she was in agreement with scheduling visit.  I have scheduled a Telehealth Consult for 11/11/21 @ 2 PM ?

## 2021-11-11 ENCOUNTER — Other Ambulatory Visit: Payer: Medicare Other | Admitting: Nurse Practitioner

## 2021-11-11 ENCOUNTER — Other Ambulatory Visit: Payer: Self-pay

## 2021-11-11 ENCOUNTER — Telehealth: Payer: Self-pay | Admitting: Nurse Practitioner

## 2021-11-11 NOTE — Telephone Encounter (Signed)
I called Deanna Morford, Ms. [place] daughter for f/u telemedicine visit, Ms. Mangels endorses she forgot about the appointment and will need to reschedule. Rescheduled per request ?

## 2021-12-02 ENCOUNTER — Telehealth: Payer: Self-pay | Admitting: Nurse Practitioner

## 2021-12-02 ENCOUNTER — Other Ambulatory Visit: Payer: Medicare Other | Admitting: Nurse Practitioner

## 2021-12-02 NOTE — Telephone Encounter (Signed)
I called Deanna Zuleta, Ms. [place] daughter for scheduled initial pc visit, no answer, message left with contact information to return call.  ?

## 2021-12-03 ENCOUNTER — Telehealth: Payer: Self-pay | Admitting: Nurse Practitioner

## 2021-12-03 NOTE — Telephone Encounter (Signed)
Attempted to contact patient's daughter [name], to reschedule the Palliative Consult, no answer - left message requesting a return call to reschedule visit, contact information left. ?

## 2021-12-14 ENCOUNTER — Telehealth: Payer: Self-pay | Admitting: Nurse Practitioner

## 2021-12-14 NOTE — Telephone Encounter (Signed)
Spoke with patient's daughter Everlena Mackley and have Reschedule the Palliative Consult for 12/21/21 @ 1 PM. ?

## 2021-12-21 ENCOUNTER — Other Ambulatory Visit: Payer: Medicare Other | Admitting: Nurse Practitioner

## 2021-12-21 ENCOUNTER — Telehealth: Payer: Self-pay | Admitting: Nurse Practitioner

## 2021-12-21 NOTE — Telephone Encounter (Signed)
I called Ms. [place] daughter [name] for scheduled PC visit, no answer, message left with contact information to call back to reschedule.  ?

## 2022-01-03 ENCOUNTER — Emergency Department
Admission: EM | Admit: 2022-01-03 | Discharge: 2022-01-03 | Disposition: A | Discharge status: Home / Self Care | Payer: Medicare Other | Attending: Emergency Medicine | Admitting: Emergency Medicine

## 2022-01-03 DIAGNOSIS — Y69 Unspecified misadventure during surgical and medical care: Secondary | ICD-10-CM | POA: Diagnosis not present

## 2022-01-03 DIAGNOSIS — J449 Chronic obstructive pulmonary disease, unspecified: Secondary | ICD-10-CM | POA: Insufficient documentation

## 2022-01-03 DIAGNOSIS — T8249XA Other complication of vascular dialysis catheter, initial encounter: Secondary | ICD-10-CM | POA: Diagnosis not present

## 2022-01-03 DIAGNOSIS — Z951 Presence of aortocoronary bypass graft: Secondary | ICD-10-CM | POA: Insufficient documentation

## 2022-01-03 DIAGNOSIS — I2510 Atherosclerotic heart disease of native coronary artery without angina pectoris: Secondary | ICD-10-CM | POA: Diagnosis not present

## 2022-01-03 DIAGNOSIS — N186 End stage renal disease: Secondary | ICD-10-CM | POA: Insufficient documentation

## 2022-01-03 DIAGNOSIS — Z992 Dependence on renal dialysis: Secondary | ICD-10-CM | POA: Insufficient documentation

## 2022-01-03 DIAGNOSIS — T829XXA Unspecified complication of cardiac and vascular prosthetic device, implant and graft, initial encounter: Secondary | ICD-10-CM

## 2022-01-03 LAB — CBC WITH DIFFERENTIAL/PLATELET
Abs Immature Granulocytes: 0.05 10*3/uL (ref 0.00–0.07)
Basophils Absolute: 0.1 10*3/uL (ref 0.0–0.1)
Basophils Relative: 1 %
Eosinophils Absolute: 0.3 10*3/uL (ref 0.0–0.5)
Eosinophils Relative: 4 %
HCT: 29.2 % — ABNORMAL LOW (ref 36.0–46.0)
Hemoglobin: 9.3 g/dL — ABNORMAL LOW (ref 12.0–15.0)
Immature Granulocytes: 1 %
Lymphocytes Relative: 29 %
Lymphs Abs: 2.6 10*3/uL (ref 0.7–4.0)
MCH: 34.3 pg — ABNORMAL HIGH (ref 26.0–34.0)
MCHC: 31.8 g/dL (ref 30.0–36.0)
MCV: 107.7 fL — ABNORMAL HIGH (ref 80.0–100.0)
Monocytes Absolute: 0.7 10*3/uL (ref 0.1–1.0)
Monocytes Relative: 8 %
Neutro Abs: 5.3 10*3/uL (ref 1.7–7.7)
Neutrophils Relative %: 57 %
Platelets: 120 10*3/uL — ABNORMAL LOW (ref 150–400)
RBC: 2.71 MIL/uL — ABNORMAL LOW (ref 3.87–5.11)
RDW: 13.1 % (ref 11.5–15.5)
WBC: 9.1 10*3/uL (ref 4.0–10.5)
nRBC: 0 % (ref 0.0–0.2)

## 2022-01-03 LAB — BASIC METABOLIC PANEL
Anion gap: 7 (ref 5–15)
BUN: 16 mg/dL (ref 8–23)
CO2: 27 mmol/L (ref 22–32)
Calcium: 7.9 mg/dL — ABNORMAL LOW (ref 8.9–10.3)
Chloride: 107 mmol/L (ref 98–111)
Creatinine, Ser: 2.21 mg/dL — ABNORMAL HIGH (ref 0.44–1.00)
GFR, Estimated: 21 mL/min — ABNORMAL LOW (ref >60)
Glucose, Bld: 215 mg/dL — ABNORMAL HIGH (ref 70–99)
Potassium: 3.9 mmol/L (ref 3.5–5.1)
Sodium: 141 mmol/L (ref 135–145)

## 2022-01-03 MED ORDER — MIDODRINE HCL 5 MG PO TABS
5.0000 mg | ORAL_TABLET | Freq: Once | ORAL | Status: AC
  Administered 2022-01-03: 5 mg via ORAL
  Filled 2022-01-03: qty 1

## 2022-01-03 MED ORDER — LORAZEPAM 2 MG/ML IJ SOLN
0.5000 mg | Freq: Once | INTRAMUSCULAR | Status: AC
Start: 2022-01-03 — End: 2022-01-03
  Administered 2022-01-03: 0.5 mg via INTRAVENOUS
  Filled 2022-01-03: qty 1

## 2022-01-03 MED ORDER — SODIUM CHLORIDE 0.9 % IV BOLUS
500.0000 mL | Freq: Once | INTRAVENOUS | Status: AC
  Administered 2022-01-03: 500 mL via INTRAVENOUS

## 2022-01-03 NOTE — ED Notes (Signed)
Patient coming from dialysis, per EMS pt loss approx 200-300 mL's of blood. Pt vomited once at dialysis and multiple times for EMS, unknown history of A-fib, pt on 3L O2, unknown LOC. ?

## 2022-01-03 NOTE — Discharge Instructions (Signed)
As we discussed, please have Dr. Jimmye Norman recheck her hemoglobin (blood counts) tomorrow during her PCP visit.  The value today was 9.3.  ? ?Continue with dialysis as scheduled on Wednesday. ?

## 2022-01-03 NOTE — ED Provider Notes (Signed)
? ?[facility] ?Provider Note ? ? ? Event Date/Time  ? First MD Initiated Contact with Patient 01/03/22 1453   ?  (approximate) ? ? ?History  ? ?No chief complaint on file. ? ? ?HPI ? ?Dawn Bradford is a 86 y.o. female who presents to the ED for evaluation of No chief complaint on file. ?  ?I reviewed DC summary from 1/28.  ESRD on hemodialysis MWF, CAD/CABG, COPD and diastolic dysfunction, PAD. ? ?Patient presents to the ED by EMS from her dialysis facility after she accidentally decannulated while she was getting her dialysis done today, unknown amount of time that she was bleeding from her AV fistula access site before dialysis technicians noted this.  There was reportedly "a lot of blood" soaking the bedsheet behind the patient, EMS estimated 2 to 300 cc.  She presents to the ED hemostatic with her AV fistula still cannulated with 2 needles from dialysis. ? ?Majority of history otherwise provided by 2 daughters at the bedside.  Patient is pleasantly disoriented.  They report that she gets very anxious and panics quite easily and frequently.  She still lives at home, but has a 24/7 caregiver.  No recent illnesses or acute events prior to today.  She has been steadily declining at home.  Mostly nonambulatory these days. ? ? ?Physical Exam  ? ?Triage Vital Signs: ?ED Triage Vitals  ?Enc Vitals Group  ?   BP 01/03/22 1436 (!) 101/49  ?   Pulse Rate 01/03/22 1436 100  ?   Resp 01/03/22 1436 (!) 25  ?   Temp --   ?   Temp Source 01/03/22 1436 Oral  ?   SpO2 01/03/22 1436 100 %  ?   Weight 01/03/22 1439 182 lb (82.6 kg)  ?   Height 01/03/22 1439 5\' 5"  (1.651 m)  ?   Head Circumference --   ?   Peak Flow --   ?   Pain Score --   ?   Pain Loc --   ?   Pain Edu? --   ?   Excl. in [place]? --   ? ? ?Most recent vital signs: ?Vitals:  ? 01/03/22 1600 01/03/22 1622  ?BP: (!) 91/31 (!) 93/49  ?Pulse: 90 88  ?Resp: (!) 21 19  ?SpO2: 100% 99%  ? ? ?General: Awake.  Obese, sitting up in bed and disoriented.   Redirectable by her daughters.  Seems fairly anxious ?CV:  Good peripheral perfusion.  Tachycardic and irregular ?Resp:  Normal effort.  ?Abd:  No distention.  Soft and benign ?MSK:  No deformity noted.  Left upper extremity AV fistula is accessed and hemostatic.  No significant hematoma.  Left arm is distally neurovascularly intact. ?Neuro:  No focal deficits appreciated. ?Other:   ? ? ?ED Results / Procedures / Treatments  ? ?Labs ?(all labs ordered are listed, but only abnormal results are displayed) ?Labs Reviewed  ?CBC WITH DIFFERENTIAL/PLATELET - Abnormal; Notable for the following components:  ?    Result Value  ? RBC 2.71 (*)   ? Hemoglobin 9.3 (*)   ? HCT 29.2 (*)   ? MCV 107.7 (*)   ? MCH 34.3 (*)   ? Platelets 120 (*)   ? All other components within normal limits  ?BASIC METABOLIC PANEL - Abnormal; Notable for the following components:  ? Glucose, Bld 215 (*)   ? Creatinine, Ser 2.21 (*)   ? Calcium 7.9 (*)   ? GFR, Estimated  21 (*)   ? All other components within normal limits  ?TYPE AND SCREEN  ?TYPE AND SCREEN  ? ? ?EKG ?Sinus tachycardia with a rate of 103 bpm, sinus arrhythmia.  Normal axis.  QTc 495.  No STEMI. ? ?RADIOLOGY ? ? ?Official radiology report(s): ?No results found. ? ?PROCEDURES and INTERVENTIONS: ? ?.1-3 Lead EKG Interpretation ?Performed by: Vladimir Crofts, MD ?Authorized by: Vladimir Crofts, MD  ? ?  Interpretation: abnormal   ?  ECG rate:  102 ?  ECG rate assessment: tachycardic   ?  Rhythm: sinus tachycardia   ?  Ectopy: none   ?  Conduction: normal   ?.Critical Care ?Performed by: Vladimir Crofts, MD ?Authorized by: Vladimir Crofts, MD  ? ?Critical care provider statement:  ?  Critical care time (minutes):  30 ?  Critical care time was exclusive of:  Separately billable procedures and treating other patients ?  Critical care was necessary to treat or prevent imminent or life-threatening deterioration of the following conditions:  Circulatory failure ?  Critical care was time spent personally  by me on the following activities:  Development of treatment plan with patient or surrogate, discussions with consultants, evaluation of patient's response to treatment, examination of patient, ordering and review of laboratory studies, ordering and review of radiographic studies, ordering and performing treatments and interventions, pulse oximetry, re-evaluation of patient's condition and review of old charts ? ?Medications  ?sodium chloride 0.9 % bolus 500 mL (500 mLs Intravenous New Bag/Given 01/03/22 1509)  ?LORazepam (ATIVAN) injection 0.5 mg (0.5 mg Intravenous Given 01/03/22 1510)  ?midodrine (PROAMATINE) tablet 5 mg (5 mg Oral Given 01/03/22 1620)  ? ? ? ?IMPRESSION / MDM / ASSESSMENT AND PLAN / ED COURSE  ?I reviewed the triage vital signs and the nursing notes. ? ?Disoriented dialysis patient presents to the ED after she had a bleeding event while getting dialysis, with a small hemoglobin drop and medications for transfusion and suitable for continued outpatient management.  She is pleasantly disoriented here in the ED.  Presents with soft blood pressures, resolved with IV fluids and her home midodrine.  Blood work demonstrates one-point hemoglobin drop from a value 3 months ago.  Metabolic panel without indications for emergent dialysis.  She is observed for greater than 4 hours without any other bleeding events.  Has some soft blood pressures after the Ativan while she was sleeping, resolved when she awakens easily.  She has close follow-up with her PCP already scheduled for tomorrow and I think outpatient management would be reasonable.  Consider observation admission for this patient, but ultimately we decided upon going back home with her 24/7 caregiver.  Discussed return precautions ? ?Clinical Course as of 01/03/22 1823  ?Mon Jan 03, 2022  ?76 Updated daughter is of small hemoglobin drop, but no indications for transfusions at this point.  They report that she has follow-up with her PCP already  scheduled tomorrow.  We discussed rechecking hemoglobin tomorrow as there can be some lagging hemoglobin changes in the setting of an acute bleed.  She remains hemodynamically stable.  More calm now after a small dose of Ativan. [DS]  ?[place].  Fast asleep.  Pressures have softened out a little bit and we will give her her home dose of midodrine. [DS]  ?1626 Reassessed, MAP of 64. Waking up now, at baseline per daughters. Getting midodrine now [DS]  ?[place] staying 65 and higher, remains at baseline [DS]  ?  ?Clinical Course User Index ?[DS] Tamala Julian,  Camillia Herter, MD  ? ? ? ?FINAL CLINICAL IMPRESSION(S) / ED DIAGNOSES  ? ?Final diagnoses:  ?Complication of AV dialysis fistula, initial encounter  ? ? ? ?Rx / DC Orders  ? ?ED Discharge Orders   ? ? None  ? ?  ? ? ? ?Note:  This document was prepared using Dragon voice recognition software and may include unintentional dictation errors. ?  ?Vladimir Crofts, MD ?01/03/22 1834 ? ?

## 2022-01-03 NOTE — ED Notes (Signed)
Pt is alert, discharge instructions given to family, pt assisted to wheelchair, pt in stable condition. ?

## 2022-01-06 ENCOUNTER — Ambulatory Visit (INDEPENDENT_AMBULATORY_CARE_PROVIDER_SITE_OTHER): Payer: Medicare Other | Admitting: Physician Assistant

## 2022-01-06 VITALS — BP 77/43 | HR 81 | Temp 97.5°F

## 2022-01-06 DIAGNOSIS — R8281 Pyuria: Secondary | ICD-10-CM | POA: Diagnosis not present

## 2022-01-06 DIAGNOSIS — R8271 Bacteriuria: Secondary | ICD-10-CM

## 2022-01-06 DIAGNOSIS — N186 End stage renal disease: Secondary | ICD-10-CM

## 2022-01-06 DIAGNOSIS — R109 Unspecified abdominal pain: Secondary | ICD-10-CM

## 2022-01-06 LAB — URINALYSIS, COMPLETE
Bilirubin, UA: NEGATIVE
Glucose, UA: NEGATIVE
Nitrite, UA: NEGATIVE
Specific Gravity, UA: 1.02 (ref 1.005–1.030)
Urobilinogen, Ur: 0.2 mg/dL (ref 0.2–1.0)
pH, UA: 5.5 (ref 5.0–7.5)

## 2022-01-06 LAB — MICROSCOPIC EXAMINATION

## 2022-01-06 NOTE — Progress Notes (Signed)
? ?01/06/2022 ?2:20 PM  ? ?Dawn Bradford ?04-Jul-1936 ?[id number] ? ?CC: ?Chief Complaint  ?Patient presents with  ? Urinary Retention  ? ?HPI: ?Dawn Bradford is a 86 y.o. comorbid female with ESRD on HD MWF and dementia who presents today for evaluation of possible UTI.  She is accompanied today by her daughter, [name], and her caregiver, who contributes to HPI. ? ?She was seen in the emergency department 3 days ago after accidentally D cannulating at dialysis with an estimated blood loss of 200 to 300 cc.  Her hemoglobin dropped 2 points, however she did not receive transfusion. ? ?Her daughter reports that she is at her baseline level of confusion, however she has been reporting pain in her back.  She is concerned that this may be associated with a kidney or bladder infection. ? ?Caregiver reports she is also been reporting a headache. ? ?Notably, she has been hypotensive since her recent bleeding episode and is on midodrine as needed. ? ?In-office catheterized UA today positive for trace ketones, trace intact blood, 2+ protein, and 2+ leukocyte esterase; urine microscopy with 11-30 WBCs/HPF and moderate bacteria.  Residual approximately 10 mL. ? ?PMH: ?Past Medical History:  ?Diagnosis Date  ? ACE-inhibitor cough   ? Aortic valve disorder   ? Echo in 2009 showed mean aortic valve gradient of 13 mmHg, suggesting very mild stenosis. Echo (12/10) suggested aortic sclerosis only  ? Carotid artery disease ([place])   ? mild, carotid dopplers 12/2009  ? CHF (congestive heart failure) ([place])   ? COPD (chronic obstructive pulmonary disease) ([place])   ? Coronary artery disease   ? s/p anterior MI in 1996 followed by CABG. Lexiscan myoview (4/11): EF 67%, normal perfusion with no evidence for ischemia or infarction.   ? Diabetes mellitus   ? type 2  ? Diastolic heart failure   ? most recent echo (12/10) showed EF 55-60% with mild LVH, grade I diastolic dysfunction, mild LAE.  ? ESRD (end stage renal disease) on dialysis [facility])   ?  Hyperlipidemia   ? Hypertension   ? resistant hyptertension times many years. the patient does have renal artery stenosis. she has tried calcium channel blockers in the past and states that she would not take them now because she had some problems with her gums which her dentist identified as calcium-channel blocker side effects.  ? Hypothyroidism   ? Mild asthma   ? PFT 02/05/10 FEV1 1.38, FEV1% 73, TLC 3.78 (86%), DLCO 48%, +BD  ? Myocardial infarction [facility])   ? Obesity   ? Obesity hypoventilation syndrome ([place])   ?    ? Obstructive sleep apnea   ? PSG 01/05/2006 AHI 24.8, CPAP 9cm H2O  ? Pulmonary nodule, right   ? Renal artery stenosis ([place])   ? The patient has an occluded right renal artery and an atrophic right kidney. There is 20% left renal artery stenosis. This was seen by catheterization in 2007  ? Stroke [facility])   ? ? ?Surgical History: ?Past Surgical History:  ?Procedure Laterality Date  ? A/V FISTULAGRAM N/A 01/25/2018  ? Procedure: A/V FISTULAGRAM;  Surgeon: Algernon Huxley, MD;  Location: [place] CV LAB;  Service: Cardiovascular;  Laterality: N/A;  ? A/V FISTULAGRAM Left 02/21/2019  ? Procedure: A/V FISTULAGRAM;  Surgeon: Algernon Huxley, MD;  Location: [place] CV LAB;  Service: Cardiovascular;  Laterality: Left;  ? A/V FISTULAGRAM Left 10/24/2019  ? Procedure: A/V FISTULAGRAM;  Surgeon: Algernon Huxley, MD;  Location:  [place] CV LAB;  Service: Cardiovascular;  Laterality: Left;  ? ABDOMINAL HYSTERECTOMY  1985  ? AV FISTULA PLACEMENT    ? CATARACT EXTRACTION  2010  ? CORONARY ARTERY BYPASS GRAFT  1996  ? DIALYSIS FISTULA CREATION    ? [place]  ? right  ? INTRAMEDULLARY (IM) NAIL INTERTROCHANTERIC Right 08/17/2018  ? Procedure: INTRAMEDULLARY (IM) NAIL INTERTROCHANTRIC;  Surgeon: Earnestine Leys, MD;  Location: [facility] ORS;  Service: Orthopedics;  Laterality: Right;  ? PERIPHERAL VASCULAR BALLOON ANGIOPLASTY  02/21/2019  ? Procedure: PERIPHERAL VASCULAR BALLOON ANGIOPLASTY;  Surgeon: Algernon Huxley, MD;  Location: [place] CV LAB;  Service: Cardiovascular;;  ? PERIPHERAL VASCULAR CATHETERIZATION Left 01/06/2015  ? Procedure: A/V Shuntogram/Fistulagram;  Surgeon: Katha Cabal, MD;  Location: [place] CV LAB;  Service: Cardiovascular;  Laterality: Left;  ? PERIPHERAL VASCULAR CATHETERIZATION N/A 02/17/2015  ? Procedure: Dialysis/Perma Catheter Removal;  Surgeon: Katha Cabal, MD;  Location: [place] CV LAB;  Service: Cardiovascular;  Laterality: N/A;  ? PERIPHERAL VASCULAR CATHETERIZATION N/A 01/28/2016  ? Procedure: A/V Shuntogram/Fistulagram;  Surgeon: Algernon Huxley, MD;  Location: [place] CV LAB;  Service: Cardiovascular;  Laterality: N/A;  ? PERIPHERAL VASCULAR CATHETERIZATION N/A 01/28/2016  ? Procedure: A/V Shunt Intervention;  Surgeon: Algernon Huxley, MD;  Location: [place] CV LAB;  Service: Cardiovascular;  Laterality: N/A;  ? TUBAL LIGATION  1973  ? ? ?Home Medications:  ?Allergies as of 01/06/2022   ? ?   Reactions  ? Ace Inhibitors Other (See Comments)  ? Reaction:  Unknown   ? Amlodipine Other (See Comments)  ? Reaction:  Unknown   ? Clonidine Hydrochloride Other (See Comments)  ? Reaction:  Unknown   ? Codeine Hives  ? Latex Hives  ? Metformin Nausea And Vomiting  ? Tape Rash  ? ?  ? ?  ?Medication List  ?  ? ?  ? Accurate as of Jan 06, 2022  2:20 PM. If you have any questions, ask your nurse or doctor.  ?  ?  ? ?  ? ?STOP taking these medications   ? ?azithromycin 250 MG tablet ?Commonly known as: ZITHROMAX ?  ? ?  ? ?TAKE these medications   ? ?acetaminophen 325 MG tablet ?Commonly known as: TYLENOL ?Take 2 tablets (650 mg total) by mouth every 6 (six) hours as needed for mild pain, fever or headache (or Fever >/= 101). ?  ?albuterol 108 (90 Base) MCG/ACT inhaler ?Commonly known as: VENTOLIN HFA ?Inhale 2 puffs into the lungs every 6 (six) hours as needed for wheezing or shortness of breath. ?  ?aspirin EC 81 MG tablet ?Take 81 mg by mouth daily. ?  ?carvedilol 25  MG tablet ?Commonly known as: COREG ?Take 25 mg by mouth 2 (two) times daily. Patient is to skip morning dose on Monday, Wednesday and Friday ?  ?clonazePAM 0.5 MG tablet ?Commonly known as: KLONOPIN ?Take 0.5 mg by mouth 2 (two) times daily as needed for anxiety. ?  ?DERMACLOUD EX ?Apply liberal amount topically to area of skin irritation as needed.  OK to leave at bedside ?  ?FLUoxetine 20 MG capsule ?Commonly known as: PROZAC ?Take 40 mg by mouth daily. ?  ?furosemide 80 MG tablet ?Commonly known as: LASIX ?Give 1 tablet by mouth daily on Sunday, Tuesday and Saturday ?  ?furosemide 80 MG tablet ?Commonly known as: LASIX ?Take 1 tablet by mouth daily. ?  ?hyoscyamine 0.125 MG Tbdp disintergrating tablet ?Commonly  known as: ANASPAZ ?Take 0.125 mg by mouth every 4 (four) hours. ?  ?isosorbide dinitrate 5 MG tablet ?Commonly known as: ISORDIL ?Take 5 mg by mouth at bedtime. ?  ?levothyroxine 75 MCG tablet ?Commonly known as: SYNTHROID ?Take 75 mcg by mouth daily. ?  ?lidocaine-prilocaine cream ?Commonly known as: EMLA ?Apply 1 application topically as needed (for pain). ?  ?midodrine 5 MG tablet ?Commonly known as: PROAMATINE ?Take 1 tablet (5 mg total) by mouth 2 (two) times daily with a meal. ?  ?NON FORMULARY ?Diet Type:  Renal, NAS, NCS, 1200 cc fluid restriction ?  ?nystatin powder ?Generic drug: nystatin ?Apply 1 g topically as needed (for irritation). ?  ?ondansetron 4 MG disintegrating tablet ?Commonly known as: ZOFRAN-ODT ?Take 4 mg by mouth every 8 (eight) hours as needed for nausea or vomiting. ?  ?oxybutynin 5 MG tablet ?Commonly known as: DITROPAN ?Take 0.5 tablets (2.5 mg total) by mouth daily as needed for bladder spasms. ?  ?OXYGEN ?Inhale 2 L/min into the lungs continuous. ?  ?polyethylene glycol 17 g packet ?Commonly known as: MIRALAX / GLYCOLAX ?Take 17 g by mouth daily. ?  ?rosuvastatin 40 MG tablet ?Commonly known as: CRESTOR ?Take 40 mg by mouth daily. ?  ?Sennosides-Docusate Sodium 8.6-50 MG  Caps ?Take 2 capsules by mouth 2 (two) times daily as needed. ?  ?telmisartan 80 MG tablet ?Commonly known as: MICARDIS ?Take 40 mg by mouth daily. At night ?  ?triamcinolone cream 0.5 % ?Commonly known as: K

## 2022-01-07 ENCOUNTER — Telehealth: Payer: Self-pay | Admitting: *Deleted

## 2022-01-07 NOTE — Telephone Encounter (Signed)
-----   Message from Debroah Loop, [place] sent at 01/06/2022  4:51 PM EDT ----- ?Urine has a small amount of bacteria in it which may be consistent with UTI OR colonization not requiring treatment. She recently grew out multiple multi-drug resistant bacteria, so I strongly recommend against starting empiric antibiotics until we get her culture back next week. If she develops fever or worsening mental status in the meantime, she should go to the ED. ?----- Message ----- ?From: Interface, [facility] Lab Results In ?Sent: 01/06/2022   4:37 PM EDT ?To: Debroah Loop, PA-C ? ? ?

## 2022-01-07 NOTE — Telephone Encounter (Signed)
Pt daughter aware. Verbalized understanding.  ?

## 2022-01-09 LAB — CULTURE, URINE COMPREHENSIVE

## 2022-01-10 ENCOUNTER — Telehealth: Payer: Self-pay | Admitting: Family Medicine

## 2022-01-10 NOTE — Telephone Encounter (Signed)
Patient's daughter [name] called requesting urine culture results for Dawn Bradford. Call back number [phone] ?

## 2022-01-11 ENCOUNTER — Other Ambulatory Visit: Payer: Self-pay

## 2022-01-11 ENCOUNTER — Other Ambulatory Visit: Payer: Self-pay | Admitting: Physician Assistant

## 2022-01-11 ENCOUNTER — Emergency Department: Payer: Medicare Other

## 2022-01-11 ENCOUNTER — Inpatient Hospital Stay
Admission: EM | Admit: 2022-01-11 | Discharge: 2022-01-22 | DRG: 689 | Disposition: A | Discharge status: Hospice | Payer: Medicare Other | Attending: Internal Medicine | Admitting: Internal Medicine

## 2022-01-11 DIAGNOSIS — N2581 Secondary hyperparathyroidism of renal origin: Secondary | ICD-10-CM | POA: Diagnosis present

## 2022-01-11 DIAGNOSIS — Z8249 Family history of ischemic heart disease and other diseases of the circulatory system: Secondary | ICD-10-CM

## 2022-01-11 DIAGNOSIS — Z90710 Acquired absence of both cervix and uterus: Secondary | ICD-10-CM

## 2022-01-11 DIAGNOSIS — Z515 Encounter for palliative care: Secondary | ICD-10-CM

## 2022-01-11 DIAGNOSIS — I252 Old myocardial infarction: Secondary | ICD-10-CM

## 2022-01-11 DIAGNOSIS — I953 Hypotension of hemodialysis: Secondary | ICD-10-CM | POA: Diagnosis not present

## 2022-01-11 DIAGNOSIS — J328 Other chronic sinusitis: Secondary | ICD-10-CM | POA: Diagnosis present

## 2022-01-11 DIAGNOSIS — I5032 Chronic diastolic (congestive) heart failure: Secondary | ICD-10-CM | POA: Diagnosis present

## 2022-01-11 DIAGNOSIS — D500 Iron deficiency anemia secondary to blood loss (chronic): Secondary | ICD-10-CM | POA: Diagnosis present

## 2022-01-11 DIAGNOSIS — E039 Hypothyroidism, unspecified: Secondary | ICD-10-CM | POA: Diagnosis present

## 2022-01-11 DIAGNOSIS — Z6830 Body mass index (BMI) 30.0-30.9, adult: Secondary | ICD-10-CM

## 2022-01-11 DIAGNOSIS — Z79899 Other long term (current) drug therapy: Secondary | ICD-10-CM

## 2022-01-11 DIAGNOSIS — M899 Disorder of bone, unspecified: Secondary | ICD-10-CM | POA: Diagnosis present

## 2022-01-11 DIAGNOSIS — D631 Anemia in chronic kidney disease: Secondary | ICD-10-CM | POA: Diagnosis present

## 2022-01-11 DIAGNOSIS — R531 Weakness: Principal | ICD-10-CM

## 2022-01-11 DIAGNOSIS — D649 Anemia, unspecified: Secondary | ICD-10-CM

## 2022-01-11 DIAGNOSIS — E538 Deficiency of other specified B group vitamins: Secondary | ICD-10-CM | POA: Diagnosis present

## 2022-01-11 DIAGNOSIS — K869 Disease of pancreas, unspecified: Secondary | ICD-10-CM | POA: Diagnosis present

## 2022-01-11 DIAGNOSIS — E785 Hyperlipidemia, unspecified: Secondary | ICD-10-CM | POA: Diagnosis present

## 2022-01-11 DIAGNOSIS — J449 Chronic obstructive pulmonary disease, unspecified: Secondary | ICD-10-CM | POA: Diagnosis present

## 2022-01-11 DIAGNOSIS — Z841 Family history of disorders of kidney and ureter: Secondary | ICD-10-CM

## 2022-01-11 DIAGNOSIS — Z951 Presence of aortocoronary bypass graft: Secondary | ICD-10-CM

## 2022-01-11 DIAGNOSIS — I10 Essential (primary) hypertension: Secondary | ICD-10-CM | POA: Diagnosis present

## 2022-01-11 DIAGNOSIS — I2510 Atherosclerotic heart disease of native coronary artery without angina pectoris: Secondary | ICD-10-CM | POA: Diagnosis present

## 2022-01-11 DIAGNOSIS — Z992 Dependence on renal dialysis: Secondary | ICD-10-CM

## 2022-01-11 DIAGNOSIS — D7589 Other specified diseases of blood and blood-forming organs: Secondary | ICD-10-CM | POA: Diagnosis present

## 2022-01-11 DIAGNOSIS — R262 Difficulty in walking, not elsewhere classified: Secondary | ICD-10-CM | POA: Diagnosis present

## 2022-01-11 DIAGNOSIS — Z833 Family history of diabetes mellitus: Secondary | ICD-10-CM

## 2022-01-11 DIAGNOSIS — Z794 Long term (current) use of insulin: Secondary | ICD-10-CM

## 2022-01-11 DIAGNOSIS — Z66 Do not resuscitate: Secondary | ICD-10-CM | POA: Diagnosis present

## 2022-01-11 DIAGNOSIS — Z8673 Personal history of transient ischemic attack (TIA), and cerebral infarction without residual deficits: Secondary | ICD-10-CM

## 2022-01-11 DIAGNOSIS — Z888 Allergy status to other drugs, medicaments and biological substances status: Secondary | ICD-10-CM

## 2022-01-11 DIAGNOSIS — R32 Unspecified urinary incontinence: Secondary | ICD-10-CM | POA: Diagnosis present

## 2022-01-11 DIAGNOSIS — D696 Thrombocytopenia, unspecified: Secondary | ICD-10-CM | POA: Diagnosis present

## 2022-01-11 DIAGNOSIS — I132 Hypertensive heart and chronic kidney disease with heart failure and with stage 5 chronic kidney disease, or end stage renal disease: Secondary | ICD-10-CM | POA: Diagnosis present

## 2022-01-11 DIAGNOSIS — N390 Urinary tract infection, site not specified: Principal | ICD-10-CM | POA: Diagnosis present

## 2022-01-11 DIAGNOSIS — N186 End stage renal disease: Secondary | ICD-10-CM | POA: Diagnosis present

## 2022-01-11 DIAGNOSIS — L539 Erythematous condition, unspecified: Secondary | ICD-10-CM

## 2022-01-11 DIAGNOSIS — G9341 Metabolic encephalopathy: Secondary | ICD-10-CM | POA: Diagnosis not present

## 2022-01-11 DIAGNOSIS — F32A Depression, unspecified: Secondary | ICD-10-CM

## 2022-01-11 DIAGNOSIS — R109 Unspecified abdominal pain: Secondary | ICD-10-CM

## 2022-01-11 DIAGNOSIS — L89152 Pressure ulcer of sacral region, stage 2: Secondary | ICD-10-CM | POA: Diagnosis present

## 2022-01-11 DIAGNOSIS — M79601 Pain in right arm: Secondary | ICD-10-CM

## 2022-01-11 DIAGNOSIS — I701 Atherosclerosis of renal artery: Secondary | ICD-10-CM | POA: Diagnosis present

## 2022-01-11 DIAGNOSIS — F0394 Unspecified dementia, unspecified severity, with anxiety: Secondary | ICD-10-CM | POA: Diagnosis present

## 2022-01-11 DIAGNOSIS — E1122 Type 2 diabetes mellitus with diabetic chronic kidney disease: Secondary | ICD-10-CM | POA: Diagnosis present

## 2022-01-11 DIAGNOSIS — F419 Anxiety disorder, unspecified: Secondary | ICD-10-CM

## 2022-01-11 DIAGNOSIS — G4733 Obstructive sleep apnea (adult) (pediatric): Secondary | ICD-10-CM | POA: Diagnosis present

## 2022-01-11 DIAGNOSIS — Z91040 Latex allergy status: Secondary | ICD-10-CM

## 2022-01-11 DIAGNOSIS — B9620 Unspecified Escherichia coli [E. coli] as the cause of diseases classified elsewhere: Secondary | ICD-10-CM | POA: Diagnosis present

## 2022-01-11 DIAGNOSIS — I4891 Unspecified atrial fibrillation: Secondary | ICD-10-CM | POA: Diagnosis present

## 2022-01-11 DIAGNOSIS — F0393 Unspecified dementia, unspecified severity, with mood disturbance: Secondary | ICD-10-CM | POA: Diagnosis present

## 2022-01-11 DIAGNOSIS — Z885 Allergy status to narcotic agent status: Secondary | ICD-10-CM

## 2022-01-11 LAB — COMPREHENSIVE METABOLIC PANEL
ALT: 10 U/L (ref 0–44)
AST: 13 U/L — ABNORMAL LOW (ref 15–41)
Albumin: 2.8 g/dL — ABNORMAL LOW (ref 3.5–5.0)
Alkaline Phosphatase: 62 U/L (ref 38–126)
Anion gap: 9 (ref 5–15)
BUN: 25 mg/dL — ABNORMAL HIGH (ref 8–23)
CO2: 26 mmol/L (ref 22–32)
Calcium: 8.2 mg/dL — ABNORMAL LOW (ref 8.9–10.3)
Chloride: 109 mmol/L (ref 98–111)
Creatinine, Ser: 3.45 mg/dL — ABNORMAL HIGH (ref 0.44–1.00)
GFR, Estimated: 13 mL/min — ABNORMAL LOW (ref >60)
Glucose, Bld: 132 mg/dL — ABNORMAL HIGH (ref 70–99)
Potassium: 3.8 mmol/L (ref 3.5–5.1)
Sodium: 144 mmol/L (ref 135–145)
Total Bilirubin: 0.5 mg/dL (ref 0.3–1.2)
Total Protein: 5.7 g/dL — ABNORMAL LOW (ref 6.5–8.1)

## 2022-01-11 LAB — CBC
HCT: 23.2 % — ABNORMAL LOW (ref 36.0–46.0)
Hemoglobin: 6.7 g/dL — ABNORMAL LOW (ref 12.0–15.0)
MCH: 33.5 pg (ref 26.0–34.0)
MCHC: 28.9 g/dL — ABNORMAL LOW (ref 30.0–36.0)
MCV: 116 fL — ABNORMAL HIGH (ref 80.0–100.0)
Platelets: 122 10*3/uL — ABNORMAL LOW (ref 150–400)
RBC: 2 MIL/uL — ABNORMAL LOW (ref 3.87–5.11)
RDW: 15 % (ref 11.5–15.5)
WBC: 7.1 10*3/uL (ref 4.0–10.5)
nRBC: 0 % (ref 0.0–0.2)

## 2022-01-11 LAB — LACTIC ACID, PLASMA: Lactic Acid, Venous: 0.9 mmol/L (ref 0.5–1.9)

## 2022-01-11 MED ORDER — CIPROFLOXACIN HCL 250 MG PO TABS: 250.0000 mg | ORAL_TABLET | Freq: Every day | ORAL | 0 refills | Status: DC

## 2022-01-11 MED ORDER — SODIUM CHLORIDE 0.9 % IV SOLN: 10.0000 mL/h | Freq: Once | INTRAVENOUS | Status: DC

## 2022-01-11 MED ORDER — IOHEXOL 300 MG/ML  SOLN
100.0000 mL | Freq: Once | INTRAMUSCULAR | Status: AC | PRN
  Administered 2022-01-11: 100 mL via INTRAVENOUS

## 2022-01-11 MED ORDER — SODIUM CHLORIDE 0.9 % IV SOLN
1.0000 g | Freq: Once | INTRAVENOUS | Status: AC
  Administered 2022-01-11: 1 g via INTRAVENOUS
  Filled 2022-01-11: qty 10

## 2022-01-11 NOTE — ED Triage Notes (Signed)
Pt come with c/o low hgb and recent decline in her labs. Family reports last hgb result was 7. Pt is confused and was recently dx with UTI. ?Pt does have dementia. ? ?Pt is weak and unable to stand.  ?

## 2022-01-11 NOTE — Telephone Encounter (Signed)
Pt's other daughter, Benjamine Mola, called asking about results [phone]) [phone] ?

## 2022-01-11 NOTE — ED Provider Triage Note (Signed)
Emergency Medicine Provider Triage Evaluation Note ? ?Dawn Bradford, a 86 y.o. female  was evaluated in triage.  Pt complains of low hemoglobin, headache, and UTI.  Presents to the ED accompanied by her daughter, who gives report from the patient's caregiver, the patient who was recent diagnosed with UTI last week, recently got culture results and the antibiotic called in today.  Patient started medications.  Patient has dementia at baseline.  Completed dialysis today, and was found to have a low hemoglobin of 7.  Daughter denies any reports of dark tarry stools, or bright red blood. ? ?Review of Systems  ?Positive: Low hgb, UTI ?Negative: NVD ? ?Physical Exam  ?BP (!) 111/50   Pulse 89   Temp 98.6 ?F (37 ?C) (Oral)   Resp 16   LMP 02/01/1986 (Approximate)   SpO2 99%  ?Gen:   Awake, no distress   ?Resp:  Normal effort  ?MSK:   Moves extremities without difficulty  ?ABD:  Soft, nontender ? ?Medical Decision Making  ?Medically screening exam initiated at 5:13 PM.  Appropriate orders placed.  Dawn Bradford was informed that the remainder of the evaluation will be completed by another provider, this initial triage assessment does not replace that evaluation, and the importance of remaining in the ED until their evaluation is complete. ? ?Geriatric patient with dementia at baseline, presents from dialysis with reports of low hemoglobin.  Daughter also reports a current untreated UTI, and notes that the patient seems more confused than baseline. ?  ?Melvenia Needles, PA-C ?01/11/22 1715 ? ?

## 2022-01-11 NOTE — ED Provider Notes (Signed)
? ?[facility] ?Provider Note ? ? ? Event Date/Time  ? First MD Initiated Contact with Patient 01/11/22 2125   ?  (approximate) ? ? ?History  ? ?Altered Mental Status ? ? ?HPI ? ?Dawn Bradford is a 86 y.o. female  who, per clinic note dated 01/06/22 was being seen for flank pain and findings were concerning for possible urinary infection, who presents to the emergency department today because of concerns for increased weakness and altered mental status.  Patient herself is unable to give any significant history so history is obtained from daughter is at bedside.  They state that they have noticed that the patient has had increasing weakness and confusion over the past roughly 8 days.  This all started 8 days ago when the patient was at dialysis and one of the needles became loose.  Because of this she lost quite a bit of blood.  Apparently blood was checked at this time and her blood level has continued to decline.  When they talked to primary care today they were concerned that she might be bleeding somewhere else.  Additionally the patient was recently seen at urology and was diagnosed with a UTI.  Patient has not yet started antibiotics. ? ?  ? ? ?Physical Exam  ? ?Triage Vital Signs: ?ED Triage Vitals  ?Enc Vitals Group  ?   BP 01/11/22 1702 (!) 111/50  ?   Pulse Rate 01/11/22 1702 89  ?   Resp 01/11/22 1702 16  ?   Temp 01/11/22 1702 98.6 ?F (37 ?C)  ?   Temp Source 01/11/22 1702 Oral  ?   SpO2 01/11/22 1702 99 %  ?   Weight --   ?   Height --   ?   Head Circumference --   ?   Peak Flow --   ?   Pain Score 01/11/22 1702 0  ?   Pain Loc --   ?   Pain Edu? --   ?   Excl. in [place]? --   ? ? ?Most recent vital signs: ?Vitals:  ? 01/11/22 1702  ?BP: (!) 111/50  ?Pulse: 89  ?Resp: 16  ?Temp: 98.6 ?F (37 ?C)  ?SpO2: 99%  ? ? ?General: Awake, alert. Not oriented.  ?CV:  Good peripheral perfusion. Regular rate and rhythm. ?Resp:  Normal effort.  ?Abd:  No distention. Minimally tender to  palpation ?Rectal:  GUIAC negative ? ? ?ED Results / Procedures / Treatments  ? ?Labs ?(all labs ordered are listed, but only abnormal results are displayed) ?Labs Reviewed  ?COMPREHENSIVE METABOLIC PANEL - Abnormal; Notable for the following components:  ?    Result Value  ? Glucose, Bld 132 (*)   ? BUN 25 (*)   ? Creatinine, Ser 3.45 (*)   ? Calcium 8.2 (*)   ? Total Protein 5.7 (*)   ? Albumin 2.8 (*)   ? AST 13 (*)   ? GFR, Estimated 13 (*)   ? All other components within normal limits  ?CBC - Abnormal; Notable for the following components:  ? RBC 2.00 (*)   ? Hemoglobin 6.7 (*)   ? HCT 23.2 (*)   ? MCV 116.0 (*)   ? MCHC 28.9 (*)   ? Platelets 122 (*)   ? All other components within normal limits  ?LACTIC ACID, PLASMA  ?URINALYSIS, COMPLETE (UACMP) WITH MICROSCOPIC  ?LACTIC ACID, PLASMA  ?CBG MONITORING, ED  ?PREPARE RBC (CROSSMATCH)  ?TYPE AND SCREEN  ?  ABO/RH  ?TYPE AND SCREEN  ? ? ? ?EKG ? ?INance Pear, attending physician, personally viewed and interpreted this EKG ? ?EKG Time: 1707 ?Rate: 92 ?Rhythm: atrial fibrillation ?Axis: normal ?Intervals: qtc 469 ?QRS: narrow ?ST changes: no st elevation ?Impression: abnormal ekg ? ?RADIOLOGY ? ?I independently interpreted and visualized the ct head. My interpretation: No bleed, no pneumothorax. ?Radiology interpretation:  ?  ?IMPRESSION:  ?1. No acute intracranial abnormality.  ?2. Chronic appearing left mastoid sinusitis with a chronic  ?left-sided mastoid effusion.  ?3. Similar age-related global parenchymal volume loss and moderate  ?burden of chronic ischemic white matter disease.  ? ? ?PROCEDURES: ? ?Critical Care performed: Yes, see critical care procedure note(s) ? ?Procedures ? ? ?MEDICATIONS ORDERED IN ED: ?Medications - No data to display ? ? ?IMPRESSION / MDM / ASSESSMENT AND PLAN / ED COURSE  ?I reviewed the triage vital signs and the nursing notes. ?             ?               ? ?Differential diagnosis includes, but is not limited to, GI bleed,  internal bleed, continue to decrease after loss of blood with dialysis. ? ?Patient presents to the emergency department today accompanied by daughters because of concerns for continued and increasing weakness and confusion.  On exam patient is awake and alert.  She however cannot give any significant history.  Work-up here does show significant anemia.  Guaiac was negative.  She was somewhat tender on exam of her abdomen so did have concern for possible internal bleed in the abdomen.  Will order abd/pel CT scan.  Additionally patient was ordered IV antibiotics given history of recent diagnosis of UTI. ? ?FINAL CLINICAL IMPRESSION(S) / ED DIAGNOSES  ? ?Final diagnoses:  ?Weakness  ?Anemia, unspecified type  ? ? ? ? ? ?Note:  This document was prepared using Dragon voice recognition software and may include unintentional dictation errors. ? ?  ?Nance Pear, MD ?01/11/22 2339 ? ?

## 2022-01-11 NOTE — ED Notes (Signed)
First nurse note-pt brought in via ems from home with low hgb.   Pt had dialysis yesterday.  Recent uti.  Hx dementia.  Bp113/58, p-91,oxygen sats 987% on 2 liters.  Family with pt   pt in recliner.  ?

## 2022-01-12 ENCOUNTER — Encounter: Payer: Self-pay | Admitting: Nephrology

## 2022-01-12 ENCOUNTER — Inpatient Hospital Stay: Payer: Medicare Other

## 2022-01-12 DIAGNOSIS — I132 Hypertensive heart and chronic kidney disease with heart failure and with stage 5 chronic kidney disease, or end stage renal disease: Secondary | ICD-10-CM | POA: Diagnosis present

## 2022-01-12 DIAGNOSIS — F32A Depression, unspecified: Secondary | ICD-10-CM

## 2022-01-12 DIAGNOSIS — Z992 Dependence on renal dialysis: Secondary | ICD-10-CM | POA: Diagnosis not present

## 2022-01-12 DIAGNOSIS — Z7189 Other specified counseling: Secondary | ICD-10-CM | POA: Diagnosis not present

## 2022-01-12 DIAGNOSIS — D500 Iron deficiency anemia secondary to blood loss (chronic): Secondary | ICD-10-CM | POA: Diagnosis present

## 2022-01-12 DIAGNOSIS — F0393 Unspecified dementia, unspecified severity, with mood disturbance: Secondary | ICD-10-CM | POA: Diagnosis present

## 2022-01-12 DIAGNOSIS — E039 Hypothyroidism, unspecified: Secondary | ICD-10-CM | POA: Diagnosis present

## 2022-01-12 DIAGNOSIS — J449 Chronic obstructive pulmonary disease, unspecified: Secondary | ICD-10-CM | POA: Diagnosis present

## 2022-01-12 DIAGNOSIS — Z515 Encounter for palliative care: Secondary | ICD-10-CM | POA: Diagnosis not present

## 2022-01-12 DIAGNOSIS — I5032 Chronic diastolic (congestive) heart failure: Secondary | ICD-10-CM | POA: Diagnosis present

## 2022-01-12 DIAGNOSIS — L89152 Pressure ulcer of sacral region, stage 2: Secondary | ICD-10-CM | POA: Diagnosis present

## 2022-01-12 DIAGNOSIS — G9341 Metabolic encephalopathy: Secondary | ICD-10-CM | POA: Diagnosis present

## 2022-01-12 DIAGNOSIS — E785 Hyperlipidemia, unspecified: Secondary | ICD-10-CM | POA: Diagnosis present

## 2022-01-12 DIAGNOSIS — F419 Anxiety disorder, unspecified: Secondary | ICD-10-CM

## 2022-01-12 DIAGNOSIS — E1122 Type 2 diabetes mellitus with diabetic chronic kidney disease: Secondary | ICD-10-CM

## 2022-01-12 DIAGNOSIS — N390 Urinary tract infection, site not specified: Secondary | ICD-10-CM | POA: Diagnosis present

## 2022-01-12 DIAGNOSIS — N2581 Secondary hyperparathyroidism of renal origin: Secondary | ICD-10-CM | POA: Diagnosis present

## 2022-01-12 DIAGNOSIS — I953 Hypotension of hemodialysis: Secondary | ICD-10-CM | POA: Diagnosis not present

## 2022-01-12 DIAGNOSIS — Z66 Do not resuscitate: Secondary | ICD-10-CM | POA: Diagnosis present

## 2022-01-12 DIAGNOSIS — Z6830 Body mass index (BMI) 30.0-30.9, adult: Secondary | ICD-10-CM | POA: Diagnosis not present

## 2022-01-12 DIAGNOSIS — I701 Atherosclerosis of renal artery: Secondary | ICD-10-CM | POA: Diagnosis present

## 2022-01-12 DIAGNOSIS — I10 Essential (primary) hypertension: Secondary | ICD-10-CM

## 2022-01-12 DIAGNOSIS — B9620 Unspecified Escherichia coli [E. coli] as the cause of diseases classified elsewhere: Secondary | ICD-10-CM | POA: Diagnosis present

## 2022-01-12 DIAGNOSIS — F0394 Unspecified dementia, unspecified severity, with anxiety: Secondary | ICD-10-CM | POA: Diagnosis present

## 2022-01-12 DIAGNOSIS — D696 Thrombocytopenia, unspecified: Secondary | ICD-10-CM | POA: Diagnosis present

## 2022-01-12 DIAGNOSIS — D631 Anemia in chronic kidney disease: Secondary | ICD-10-CM | POA: Diagnosis present

## 2022-01-12 DIAGNOSIS — N186 End stage renal disease: Secondary | ICD-10-CM

## 2022-01-12 LAB — URINALYSIS, COMPLETE (UACMP) WITH MICROSCOPIC
Bilirubin Urine: NEGATIVE
Glucose, UA: NEGATIVE mg/dL
Ketones, ur: NEGATIVE mg/dL
Nitrite: NEGATIVE
Protein, ur: 30 mg/dL — AB
Specific Gravity, Urine: 1.014 (ref 1.005–1.030)
WBC, UA: >50 WBC/hpf — ABNORMAL HIGH (ref 0–5)
pH: 5 (ref 5.0–8.0)

## 2022-01-12 LAB — CBC
HCT: 28.7 % — ABNORMAL LOW (ref 36.0–46.0)
Hemoglobin: 9 g/dL — ABNORMAL LOW (ref 12.0–15.0)
MCH: 32.4 pg (ref 26.0–34.0)
MCHC: 31.4 g/dL (ref 30.0–36.0)
MCV: 103.2 fL — ABNORMAL HIGH (ref 80.0–100.0)
Platelets: 123 10*3/uL — ABNORMAL LOW (ref 150–400)
RBC: 2.78 MIL/uL — ABNORMAL LOW (ref 3.87–5.11)
RDW: 20 % — ABNORMAL HIGH (ref 11.5–15.5)
WBC: 7.6 10*3/uL (ref 4.0–10.5)
nRBC: 0 % (ref 0.0–0.2)

## 2022-01-12 LAB — BASIC METABOLIC PANEL
Anion gap: 8 (ref 5–15)
BUN: 26 mg/dL — ABNORMAL HIGH (ref 8–23)
CO2: 28 mmol/L (ref 22–32)
Calcium: 8.2 mg/dL — ABNORMAL LOW (ref 8.9–10.3)
Chloride: 106 mmol/L (ref 98–111)
Creatinine, Ser: 3.78 mg/dL — ABNORMAL HIGH (ref 0.44–1.00)
GFR, Estimated: 11 mL/min — ABNORMAL LOW (ref >60)
Glucose, Bld: 140 mg/dL — ABNORMAL HIGH (ref 70–99)
Potassium: 4 mmol/L (ref 3.5–5.1)
Sodium: 142 mmol/L (ref 135–145)

## 2022-01-12 LAB — HEMOGLOBIN A1C
Hgb A1c MFr Bld: 5.7 % — ABNORMAL HIGH (ref 4.8–5.6)
Mean Plasma Glucose: 116.89 mg/dL

## 2022-01-12 LAB — GLUCOSE, CAPILLARY
Glucose-Capillary: 116 mg/dL — ABNORMAL HIGH (ref 70–99)
Glucose-Capillary: 135 mg/dL — ABNORMAL HIGH (ref 70–99)

## 2022-01-12 LAB — HEPATITIS PANEL, ACUTE
HCV Ab: NONREACTIVE
Hep A IgM: NONREACTIVE
Hep B C IgM: NONREACTIVE
Hepatitis B Surface Ag: NONREACTIVE

## 2022-01-12 LAB — PREPARE RBC (CROSSMATCH)

## 2022-01-12 LAB — HEPATITIS B SURFACE ANTIGEN: Hepatitis B Surface Ag: NONREACTIVE

## 2022-01-12 LAB — MRSA NEXT GEN BY PCR, NASAL: MRSA by PCR Next Gen: NOT DETECTED

## 2022-01-12 LAB — LACTIC ACID, PLASMA: Lactic Acid, Venous: 1.2 mmol/L (ref 0.5–1.9)

## 2022-01-12 MED ORDER — SODIUM CHLORIDE 0.9 % IV SOLN: 100.0000 mL | INTRAVENOUS | Status: DC | PRN

## 2022-01-12 MED ORDER — ASPIRIN EC 81 MG PO TBEC
81.0000 mg | DELAYED_RELEASE_TABLET | Freq: Every day | ORAL | Status: DC
  Administered 2022-01-12 – 2022-01-20 (×8): 81 mg via ORAL
  Filled 2022-01-12 (×9): qty 1

## 2022-01-12 MED ORDER — CARVEDILOL 25 MG PO TABS
25.0000 mg | ORAL_TABLET | ORAL | Status: DC
  Administered 2022-01-13: 25 mg via ORAL
  Filled 2022-01-12: qty 1

## 2022-01-12 MED ORDER — MIDODRINE HCL 5 MG PO TABS
5.0000 mg | ORAL_TABLET | Freq: Two times a day (BID) | ORAL | Status: DC
Start: 2022-01-12 — End: 2022-01-12
  Filled 2022-01-12: qty 1

## 2022-01-12 MED ORDER — INSULIN ASPART 100 UNIT/ML IJ SOLN
0.0000 [IU] | Freq: Three times a day (TID) | INTRAMUSCULAR | Status: DC
  Administered 2022-01-12 – 2022-01-13 (×2): 2 [IU] via SUBCUTANEOUS
  Administered 2022-01-13: 3 [IU] via SUBCUTANEOUS
  Administered 2022-01-14 – 2022-01-18 (×8): 2 [IU] via SUBCUTANEOUS
  Filled 2022-01-12 (×10): qty 1

## 2022-01-12 MED ORDER — FLUOXETINE HCL 20 MG PO CAPS
40.0000 mg | ORAL_CAPSULE | Freq: Every day | ORAL | Status: DC
  Administered 2022-01-12 – 2022-01-20 (×8): 40 mg via ORAL
  Filled 2022-01-12 (×11): qty 2

## 2022-01-12 MED ORDER — SENNOSIDES-DOCUSATE SODIUM 8.6-50 MG PO TABS
2.0000 | ORAL_TABLET | Freq: Two times a day (BID) | ORAL | Status: DC | PRN
  Administered 2022-01-16: 2 via ORAL
  Filled 2022-01-12: qty 2

## 2022-01-12 MED ORDER — CARVEDILOL 25 MG PO TABS: 25.0000 mg | ORAL_TABLET | Freq: Two times a day (BID) | ORAL | Status: DC

## 2022-01-12 MED ORDER — CLONAZEPAM 0.5 MG PO TABS
0.5000 mg | ORAL_TABLET | Freq: Two times a day (BID) | ORAL | Status: DC | PRN
  Administered 2022-01-12 – 2022-01-21 (×5): 0.5 mg via ORAL
  Filled 2022-01-12 (×10): qty 1

## 2022-01-12 MED ORDER — OXYBUTYNIN CHLORIDE 5 MG PO TABS: 2.5000 mg | ORAL_TABLET | Freq: Every day | ORAL | Status: DC | PRN

## 2022-01-12 MED ORDER — ONDANSETRON HCL 4 MG/2ML IJ SOLN
4.0000 mg | Freq: Four times a day (QID) | INTRAMUSCULAR | Status: DC | PRN
  Administered 2022-01-12: 4 mg via INTRAVENOUS
  Filled 2022-01-12 (×2): qty 2

## 2022-01-12 MED ORDER — LIDOCAINE-PRILOCAINE 2.5-2.5 % EX CREA: 1.0000 "application " | TOPICAL_CREAM | CUTANEOUS | Status: DC | PRN

## 2022-01-12 MED ORDER — CARVEDILOL 25 MG PO TABS
25.0000 mg | ORAL_TABLET | Freq: Every day | ORAL | Status: DC
  Administered 2022-01-12 – 2022-01-13 (×2): 25 mg via ORAL
  Filled 2022-01-12: qty 4
  Filled 2022-01-12 (×2): qty 1

## 2022-01-12 MED ORDER — SODIUM CHLORIDE 0.9 % IV SOLN
1.0000 g | INTRAVENOUS | Status: DC
  Administered 2022-01-13 (×2): 1 g via INTRAVENOUS
  Filled 2022-01-12 (×3): qty 10

## 2022-01-12 MED ORDER — ACETAMINOPHEN 650 MG RE SUPP: 650.0000 mg | Freq: Four times a day (QID) | RECTAL | Status: DC | PRN

## 2022-01-12 MED ORDER — ONDANSETRON 4 MG PO TBDP: 4.0000 mg | ORAL_TABLET | Freq: Three times a day (TID) | ORAL | Status: DC | PRN

## 2022-01-12 MED ORDER — NYSTATIN 100000 UNIT/GM EX POWD: 1.0000 g | CUTANEOUS | Status: DC | PRN

## 2022-01-12 MED ORDER — ALBUTEROL SULFATE (2.5 MG/3ML) 0.083% IN NEBU
2.5000 mg | INHALATION_SOLUTION | Freq: Four times a day (QID) | RESPIRATORY_TRACT | Status: DC | PRN
  Administered 2022-01-13: 2.5 mg via RESPIRATORY_TRACT
  Filled 2022-01-12: qty 3

## 2022-01-12 MED ORDER — ORAL CARE MOUTH RINSE
15.0000 mL | Freq: Two times a day (BID) | OROMUCOSAL | Status: DC
  Administered 2022-01-12 – 2022-01-21 (×15): 15 mL via OROMUCOSAL

## 2022-01-12 MED ORDER — PENTAFLUOROPROP-TETRAFLUOROETH EX AERO: 1.0000 "application " | INHALATION_SPRAY | CUTANEOUS | Status: DC | PRN

## 2022-01-12 MED ORDER — PANTOPRAZOLE SODIUM 40 MG IV SOLR
40.0000 mg | Freq: Two times a day (BID) | INTRAVENOUS | Status: DC
Start: 2022-01-12 — End: 2022-01-15
  Administered 2022-01-12 – 2022-01-14 (×6): 40 mg via INTRAVENOUS
  Filled 2022-01-12 (×6): qty 10

## 2022-01-12 MED ORDER — HYOSCYAMINE SULFATE 0.125 MG PO TBDP
0.1250 mg | ORAL_TABLET | ORAL | Status: DC
  Administered 2022-01-12 – 2022-01-15 (×17): 0.125 mg via ORAL
  Filled 2022-01-12 (×25): qty 1

## 2022-01-12 MED ORDER — MAGNESIUM HYDROXIDE 400 MG/5ML PO SUSP: 30.0000 mL | Freq: Every day | ORAL | Status: DC | PRN

## 2022-01-12 MED ORDER — ACETAMINOPHEN 325 MG PO TABS: 650.0000 mg | ORAL_TABLET | Freq: Four times a day (QID) | ORAL | Status: DC | PRN

## 2022-01-12 MED ORDER — SODIUM CHLORIDE 0.9 % IV SOLN: INTRAVENOUS | Status: DC

## 2022-01-12 MED ORDER — EPOETIN ALFA 10000 UNIT/ML IJ SOLN
10000.0000 [IU] | INTRAMUSCULAR | Status: DC
  Filled 2022-01-12: qty 1

## 2022-01-12 MED ORDER — HEPARIN SODIUM (PORCINE) 1000 UNIT/ML DIALYSIS
1000.0000 [IU] | INTRAMUSCULAR | Status: DC | PRN
  Filled 2022-01-12: qty 1

## 2022-01-12 MED ORDER — ROSUVASTATIN CALCIUM 10 MG PO TABS
40.0000 mg | ORAL_TABLET | Freq: Every day | ORAL | Status: DC
Start: 2022-01-12 — End: 2022-01-15
  Administered 2022-01-13 – 2022-01-15 (×3): 40 mg via ORAL
  Filled 2022-01-12: qty 2
  Filled 2022-01-12 (×3): qty 4

## 2022-01-12 MED ORDER — TRAZODONE HCL 50 MG PO TABS: 25.0000 mg | ORAL_TABLET | Freq: Every evening | ORAL | Status: DC | PRN

## 2022-01-12 MED ORDER — ISOSORBIDE DINITRATE 10 MG PO TABS
5.0000 mg | ORAL_TABLET | Freq: Every day | ORAL | Status: DC
Start: 2022-01-12 — End: 2022-01-16
  Administered 2022-01-12 – 2022-01-15 (×4): 5 mg via ORAL
  Filled 2022-01-12 (×4): qty 1
  Filled 2022-01-12: qty 0.5

## 2022-01-12 MED ORDER — MIDODRINE HCL 5 MG PO TABS
5.0000 mg | ORAL_TABLET | ORAL | Status: DC
  Administered 2022-01-14 – 2022-01-19 (×2): 5 mg via ORAL
  Filled 2022-01-12: qty 1

## 2022-01-12 MED ORDER — LEVOTHYROXINE SODIUM 50 MCG PO TABS
75.0000 ug | ORAL_TABLET | Freq: Every day | ORAL | Status: DC
  Administered 2022-01-12 – 2022-01-21 (×10): 75 ug via ORAL
  Filled 2022-01-12 (×11): qty 2

## 2022-01-12 MED ORDER — FUROSEMIDE 40 MG PO TABS: 80.0000 mg | ORAL_TABLET | Freq: Every day | ORAL | Status: DC

## 2022-01-12 MED ORDER — CHLORHEXIDINE GLUCONATE CLOTH 2 % EX PADS
6.0000 | MEDICATED_PAD | Freq: Every day | CUTANEOUS | Status: DC
  Administered 2022-01-13 – 2022-01-14 (×2): 6 via TOPICAL
  Filled 2022-01-12: qty 6

## 2022-01-12 MED ORDER — ENOXAPARIN SODIUM 40 MG/0.4ML IJ SOSY: 40.0000 mg | PREFILLED_SYRINGE | INTRAMUSCULAR | Status: DC

## 2022-01-12 MED ORDER — ONDANSETRON HCL 4 MG PO TABS
4.0000 mg | ORAL_TABLET | Freq: Four times a day (QID) | ORAL | Status: DC | PRN
  Administered 2022-01-17 – 2022-01-21 (×8): 4 mg via ORAL
  Filled 2022-01-12 (×8): qty 1

## 2022-01-12 MED ORDER — ALTEPLASE 2 MG IJ SOLR
2.0000 mg | Freq: Once | INTRAMUSCULAR | Status: DC | PRN
  Filled 2022-01-12: qty 2

## 2022-01-12 MED ORDER — HEPARIN SODIUM (PORCINE) 5000 UNIT/ML IJ SOLN
5000.0000 [IU] | Freq: Three times a day (TID) | INTRAMUSCULAR | Status: DC
  Administered 2022-01-12 – 2022-01-21 (×28): 5000 [IU] via SUBCUTANEOUS
  Filled 2022-01-12 (×29): qty 1

## 2022-01-12 MED ORDER — EPOETIN ALFA 10000 UNIT/ML IJ SOLN
INTRAMUSCULAR | Status: AC
Start: 2022-01-12 — End: 2022-01-12
  Administered 2022-01-12: 10000 [IU] via INTRAVENOUS
  Filled 2022-01-12: qty 1

## 2022-01-12 MED ORDER — POLYETHYLENE GLYCOL 3350 17 G PO PACK
17.0000 g | PACK | Freq: Every day | ORAL | Status: DC
  Administered 2022-01-13 – 2022-01-18 (×5): 17 g via ORAL
  Filled 2022-01-12 (×7): qty 1

## 2022-01-12 MED ORDER — IRBESARTAN 150 MG PO TABS
75.0000 mg | ORAL_TABLET | Freq: Every day | ORAL | Status: DC
Start: 2022-01-12 — End: 2022-01-16
  Administered 2022-01-13 – 2022-01-14 (×2): 75 mg via ORAL
  Filled 2022-01-12 (×3): qty 1

## 2022-01-12 MED ORDER — LIDOCAINE HCL (PF) 1 % IJ SOLN: 5.0000 mL | INTRAMUSCULAR | Status: DC | PRN

## 2022-01-12 MED ORDER — EPOETIN ALFA 10000 UNIT/ML IJ SOLN
10000.0000 [IU] | INTRAMUSCULAR | Status: DC
  Administered 2022-01-14 – 2022-01-19 (×3): 10000 [IU] via INTRAVENOUS

## 2022-01-12 NOTE — Assessment & Plan Note (Signed)
-   We will continue her Micardis and Isordil as well as Coreg. ?

## 2022-01-12 NOTE — Assessment & Plan Note (Signed)
-   Nephrology consult to be obtained and for follow-up on hemodialysis. ?- I notified Dr. Theador Hawthorne about the patient ?

## 2022-01-12 NOTE — Assessment & Plan Note (Signed)
-   We will continue her Synthroid. 

## 2022-01-12 NOTE — Assessment & Plan Note (Signed)
-   The patient will be placed on supplement coverage with NovoLog 

## 2022-01-12 NOTE — ED Notes (Signed)
Blood transfusion complete. Morning labs can be collected after 7:30 am ?

## 2022-01-12 NOTE — Progress Notes (Signed)
?PROGRESS NOTE ? ? ? ?Dawn Bradford  HUD:[id number] DOB: 28-Sep-1935 DOA: 01/11/2022 ?PCP: Pcp, No  ? ?Brief Narrative:  ?86 year old with history of ESRD on HD MWF, diastolic CHF, COPD, CAD, DM 2, HTN, HLD, hypothyroidism admitted for weakness and altered mental status worsening over the past 8 days.  Recently had her AV fistula decannulated with some drop in hemoglobin.  She also saw outpatient neurology where she was diagnosed with possible UTI and was prescribed Cipro but had not taken it.  Upon admission her hemoglobin was 6.7, EKG showed A-fib.  CT head was negative for acute pathology but showed chronic sinusitis, CT abdomen pelvis showed cardiomegaly with bilateral pleural effusion, 2.2 cm cystic pancreatic lesion.  Also started on IV Rocephin.  ? ? ?Assessment & Plan: ? Principal Problem: ?  Metabolic encephalopathy ?Active Problems: ?  Acute lower UTI ?  End-stage renal disease on hemodialysis ([place]) ?  Iron deficiency anemia due to chronic blood loss ?  Essential hypertension ?  Acquired hypothyroidism ?  Anxiety and depression ?  Type 2 diabetes mellitus with chronic kidney disease, with long-term current use of insulin ([place]) ?  ? ? ?Assessment and Plan: ?* Metabolic encephalopathy ?Urinary tract infection ?-Follow-up culture data.  Diagnosed outpatient urology office.  Empiric IV Rocephin. ?-CT of the head is negative for acute pathology.  No focal neurodeficits.  Closely continue to monitor this. ? ?Iron deficiency anemia due to chronic blood loss and macrocytosis ?-Had recent decannulation of her AV fistula.  Hemoccult is negative.  1 unit PRBC ordered ? ?End-stage renal disease on hemodialysis ([place]) ?-Nephrology team notified ? ?History of CAD status post bypass ?History of CVA ?-On aspirin and Crestor ?-Continue Coreg, Avapro, isosorbide ? ?Type 2 diabetes mellitus with chronic kidney disease, with long-term current use of insulin ([place]) ?-Sliding scale and Accu-Cheks. A1c = 5.7 ? ?Anxiety and  depression ?-Currently on Prozac and as needed Klonopin ? ?Acquired hypothyroidism ?-Synthroid ? ?Essential hypertension ?-Currently on Coreg, Avapro, isosorbide.  Also gets midodrine prior to HD? ? ?Sacral decubitus ulcer stage II-present POA ? ? ? ?DVT prophylaxis: DC subcu heparin.  Place SCDs ?Code Status: DNR ?Family Communication:  Called her daughter [name] ? ?Status is: Inpatient ?Remains inpatient appropriate because: Maintain hosp stay until her mentation and weakness improves.  ? ? ?Subjective: ?Feels very weak, seen at HD today.  ? ?Spoke with her daughter. She tells me ptn has a caregiver at home. But had noted she had progressively weakened over the past week.  Urology did a UA came back positive day prior to admission and had not had a chance to start antibiotics.  Patient also eats soft diet at home. ? ? ?Examination: ?Constitutional: Chronically ill-appearing ?Respiratory: Clear to auscultation bilaterally ?Cardiovascular: Normal sinus rhythm, no rubs ?Abdomen: Nontender nondistended good bowel sounds ?Musculoskeletal: No edema noted ?Skin: No rashes seen ?Neurologic: Nonfocal, grossly moving all extremities.  Alert to name and place ?Psychiatric: Poor judgment and insight ?Objective: ?Vitals:  ? 01/12/22 0500 01/12/22 0501 01/12/22 0527 01/12/22 0600  ?BP: 127/69  (!) 143/88 (!) 120/59  ?Pulse: (!) 27 96 98 89  ?Resp: (!) 31 (!) 22 (!) 26 (!) 27  ?Temp:   (!) 97.1 ?F (36.2 ?C)   ?TempSrc:   Axillary   ?SpO2: 98% 99% 98% 98%  ? ? ?Intake/Output Summary (Last 24 hours) at 01/12/2022 0818 ?Last data filed at 01/12/2022 0112 ?Gross per 24 hour  ?Intake 100 ml  ?Output --  ?Net 100 ml  ? ?  There were no vitals filed for this visit. ? ? ?Data Reviewed:  ? ?CBC: ?Recent Labs  ?Lab 01/11/22 ?1705  ?WBC 7.1  ?HGB 6.7*  ?HCT 23.2*  ?MCV 116.0*  ?PLT 122*  ? ?Basic Metabolic Panel: ?Recent Labs  ?Lab 01/11/22 ?1705  ?NA 144  ?K 3.8  ?CL 109  ?CO2 26  ?GLUCOSE 132*  ?BUN 25*  ?CREATININE 3.45*  ?CALCIUM 8.2*   ? ?GFR: ?Estimated Creatinine Clearance: 12.9 mL/min (A) (by C-G formula based on SCr of 3.45 mg/dL (H)). ?Liver Function Tests: ?Recent Labs  ?Lab 01/11/22 ?1705  ?AST 13*  ?ALT 10  ?ALKPHOS 62  ?BILITOT 0.5  ?PROT 5.7*  ?ALBUMIN 2.8*  ? ?No results for input(s): LIPASE, AMYLASE in the last 168 hours. ?No results for input(s): AMMONIA in the last 168 hours. ?Coagulation Profile: ?No results for input(s): INR, PROTIME in the last 168 hours. ?Cardiac Enzymes: ?No results for input(s): CKTOTAL, CKMB, CKMBINDEX, TROPONINI in the last 168 hours. ?BNP (last 3 results) ?No results for input(s): PROBNP in the last 8760 hours. ?HbA1C: ?No results for input(s): HGBA1C in the last 72 hours. ?CBG: ?No results for input(s): GLUCAP in the last 168 hours. ?Lipid Profile: ?No results for input(s): CHOL, HDL, LDLCALC, TRIG, CHOLHDL, LDLDIRECT in the last 72 hours. ?Thyroid Function Tests: ?No results for input(s): TSH, T4TOTAL, FREET4, T3FREE, THYROIDAB in the last 72 hours. ?Anemia Panel: ?No results for input(s): VITAMINB12, FOLATE, FERRITIN, TIBC, IRON, RETICCTPCT in the last 72 hours. ?Sepsis Labs: ?Recent Labs  ?Lab 01/11/22 ?2209  ?LATICACIDVEN 0.9  ? ? ?Recent Results (from the past 240 hour(s))  ?Microscopic Examination     Status: Abnormal  ? Collection Time: 01/06/22  2:17 PM  ? Urine  ?Result Value Ref Range Status  ? WBC, UA 11-30 (A) 0 - 5 /hpf Final  ? RBC 0-2 0 - 2 /hpf Final  ? Epithelial Cells (non renal) 0-10 0 - 10 /hpf Final  ? Bacteria, UA Moderate (A) None seen/Few Final  ?CULTURE, URINE COMPREHENSIVE     Status: Abnormal  ? Collection Time: 01/06/22  2:57 PM  ? Specimen: Urine  ? UR  ?Result Value Ref Range Status  ? Urine Culture, Comprehensive Final report (A)  Final  ? Organism ID, Bacteria Escherichia coli (A)  Final  ?  Comment: Cefazolin <=4 ug/mL ?Cefazolin with an MIC <=16 predicts susceptibility to the oral agents ?cefaclor, cefdinir, cefpodoxime, cefprozil, cefuroxime, cephalexin, ?and loracarbef  when used for therapy of uncomplicated urinary tract ?infections due to E. coli, Klebsiella pneumoniae, and Proteus ?mirabilis. ?Greater than 100,000 colony forming units per mL ?  ? ANTIMICROBIAL SUSCEPTIBILITY Comment  Final  ?  Comment:       ** S = Susceptible; I = Intermediate; R = Resistant ** ?                   P = Positive; N = Negative ?            MICS are expressed in micrograms per mL ?   Antibiotic                 RSLT#1    RSLT#2    RSLT#3    RSLT#4 ?Amoxicillin/Clavulanic Acid    S ?Ampicillin                     R ?Cefepime  S ?Ceftriaxone                    S ?Cefuroxime                     S ?Ciprofloxacin                  S ?Ertapenem                      S ?Gentamicin                     S ?Imipenem                       S ?Levofloxacin                   S ?Meropenem                      S ?Nitrofurantoin                 S ?Piperacillin/Tazobactam        S ?Tetracycline                   R ?Tobramycin                     S ?Trimethoprim/Sulfa             R ?  ?  ? ? ? ? ? ?Radiology Studies: ?CT HEAD WO CONTRAST (5MM) ? ?Result Date: 01/11/2022 ?CLINICAL DATA:  Headache EXAM: CT HEAD WITHOUT CONTRAST TECHNIQUE: Contiguous axial images were obtained from the base of the skull through the vertex without intravenous contrast. RADIATION DOSE REDUCTION: This exam was performed according to the departmental dose-optimization program which includes automated exposure control, adjustment of the mA and/or kV according to patient size and/or use of iterative reconstruction technique. COMPARISON:  March 02, 2021 FINDINGS: Brain: No evidence of acute infarction, hemorrhage, hydrocephalus, extra-axial collection or mass lesion/mass effect. Similar age related global parenchymal volume loss with ex vacuo dilatation of ventricular system. No significant interval change in the subcortical and periventricular white matter hypodensities and more patchy confluent areas nonspecific but likely  reflecting a moderate burden chronic ischemic white matter disease. Vascular: No hyperdense vessel. Atherosclerotic calcifications of the internal carotid and vertebral arteries the skull base. Skull: Normal. Negative for

## 2022-01-12 NOTE — Assessment & Plan Note (Signed)
-   She was typed and crossmatched will be given 1 unit of packed red blood cells. ?- We will follow posttransfusion H&H. ?- Serial H&H will be followed. ?- Her stool Hemoccult came back negative per the ER physician. ?

## 2022-01-12 NOTE — ED Notes (Signed)
Per caregiver pt drinks thickened liquids, and soft foods. SLP order in place ?

## 2022-01-12 NOTE — ED Notes (Signed)
Blood bank states that the type and screen has clotted and is unable to be ran at this time. IV team is consulted for a second IV and rpt type and screen. ?

## 2022-01-12 NOTE — Progress Notes (Signed)
Hemodialysis Post Treatment Note: ? ?Tx date:01/12/2022 ?Tx time: 3 hours 30 minutes ?Access: Left AVF ?UF Removed: 1L ? ?Note: ?HD completed, tolerated well. No complications. Patient asymptomatic. ? ? ? ? ? ? ? ? ?  ?

## 2022-01-12 NOTE — ED Notes (Addendum)
Discussed pt with floor coverage Dr. Otilio Miu. Informed MD of concerns regarding current orders for pending IVF. Pt is a dialysis pt that required blood transfusions. Pt has pending normal saline at 100 ml/hr. Placed orders on hold during the night as pt was receiving blood and then increased SOB. Per discussion, IVF to be discontinued. Informed of elevated RR and audible wheezing without crackles. Daughter at bedside was concerned for aspiration following an episode of vomiting. Pt was sitting up at the time of event. Orders for portable CXR placed. Daughter at bedside informed of plan of care.  ? ?

## 2022-01-12 NOTE — Progress Notes (Signed)
Dawn Bradford  MRN: [id number]  DOB/AGE: 84/31/1937 84 y.o.  Primary Care Physician:Pcp, No  Admit date: 01/11/2022  Chief Complaint:  Chief Complaint  Patient presents with   Altered Mental Status    S-Pt presented on  01/11/2022 with  Chief Complaint  Patient presents with   Altered Mental Status  . Patient is a 86 year old Caucasian female with a past medical history of ESRD, on hemodialysis, on Monday Wednesday Friday schedule, chronic diastolic CHF, COPD, cardiac disease, diabetes mellitus type 2, hypertension, dyslipidemia, hypothyroidism who was brought to the hospital with chief complaint of altered mental status.    Patient was admitted for further care Nephrology was consulted for comanagement of dialysis patient  Patient is known to our clinic and receives outpatient dialysis at [facility] on a MWF schedule supervised by Dr. Thedore Mins.   Patient was seen today in the ER Patient is confused does not offer any new complaints Patient is accompanied by the caregiver who lives with her.  Patient caregiver gave a history that patient had a shortened dialysis treatment on Wednesday but did complete a full treatment on Friday  Patient caregiver also informed me that patient's history of present illness dates back to a week when patient started feeling weak.    Patient thereafter was thought to have UTI and urine cultures were done.  Patient was indeed diagnosed with UTI.  Patient also had her labs done and patient hemoglobin had dropped.  EMS was called and patient was brought to the ER.  Upon evaluation in the ER patient was found to have hemoglobin of 6.7.  Patient had CT head and CT abdomen done in the ER  Noncontrasted CT head  revealed no acute intracranial normalities.   It showed chronic appearing left mastoid sinusitis with chronic left-sided mastoid effusion and chronic ischemic white matter disease.  Abdominal pelvic CT scan revealed the  following: Cardiomegaly with moderate right and small left pleural effusions. Suspicion of lower lobe bronchial wall thickening which could be due to airways inflammatory process 2.2 cm cystic pancreatic lesion grossly stable in size, no specific follow-up is recommended.  Diverticular disease of the colon without acute inflammatory process.    Medication     aspirin EC  81 mg Oral Daily   carvedilol  25 mg Oral Q supper   [START ON 01/13/2022] carvedilol  25 mg Oral Once per day on Sun Tue Thu Sat   FLUoxetine  40 mg Oral Daily   furosemide  80 mg Oral Daily   heparin injection (subcutaneous)  5,000 Units Subcutaneous Q8H   hyoscyamine  0.125 mg Oral Q4H   insulin aspart  0-15 Units Subcutaneous TID AC & HS   irbesartan  75 mg Oral Daily   isosorbide dinitrate  5 mg Oral QHS   levothyroxine  75 mcg Oral Q0600   midodrine  5 mg Oral BID WC   pantoprazole (PROTONIX) IV  40 mg Intravenous Q12H   polyethylene glycol  17 g Oral Daily   rosuvastatin  40 mg Oral Daily         ROS: Unable to get much data  Physical Exam: Vital signs in last 24 hours: Temp:  [97.1 F (36.2 C)-98.6 F (37 C)] 97.1 F (36.2 C) (05/10 0527) Pulse Rate:  [27-98] 89 (05/10 0600) Resp:  [16-31] 27 (05/10 0600) BP: (90-143)/(49-88) 120/59 (05/10 0600) SpO2:  [94 %-100 %] 98 % (05/10 0600) Weight change:     Intake/Output from previous day: 05/09  0701 - 05/10 0700 In: 100 [IV Piggyback:100] Out: -  No intake/output data recorded.   Physical Exam:  General- pt is confused, unable to follow commands, chronically ill-appearing  HEENT-head is atraumatic normocephalic  Resp- No acute REsp distress, rhonchi present  CVS- S1S2 regular in rate and rhythm  GIT- BS+, soft, Non tender , Non distended  EXT- No LE Edema,  No Cyanosis  Access- LUE AVF  Psych patient has poor judgment and insight  Lab Results:  CBC  Recent Labs    01/11/22 1705  WBC 7.1  HGB 6.7*  HCT 23.2*  PLT  122*    BMET  Recent Labs    01/11/22 1705  NA 144  K 3.8  CL 109  CO2 26  GLUCOSE 132*  BUN 25*  CREATININE 3.45*  CALCIUM 8.2*      Most recent Creatinine trend  Lab Results  Component Value Date   CREATININE 3.45 (H) 01/11/2022   CREATININE 2.21 (H) 01/03/2022   CREATININE 2.97 (H) 09/27/2021      MICRO   Recent Results (from the past 240 hour(s))  Microscopic Examination     Status: Abnormal   Collection Time: 01/06/22  2:17 PM   Urine  Result Value Ref Range Status   WBC, UA 11-30 (A) 0 - 5 /hpf Final   RBC 0-2 0 - 2 /hpf Final   Epithelial Cells (non renal) 0-10 0 - 10 /hpf Final   Bacteria, UA Moderate (A) None seen/Few Final  CULTURE, URINE COMPREHENSIVE     Status: Abnormal   Collection Time: 01/06/22  2:57 PM   Specimen: Urine   UR  Result Value Ref Range Status   Urine Culture, Comprehensive Final report (A)  Final   Organism ID, Bacteria Escherichia coli (A)  Final    Comment: Cefazolin <=4 ug/mL Cefazolin with an MIC <=16 predicts susceptibility to the oral agents cefaclor, cefdinir, cefpodoxime, cefprozil, cefuroxime, cephalexin, and loracarbef when used for therapy of uncomplicated urinary tract infections due to E. coli, Klebsiella pneumoniae, and Proteus mirabilis. Greater than 100,000 colony forming units per mL    ANTIMICROBIAL SUSCEPTIBILITY Comment  Final    Comment:       ** S = Susceptible; I = Intermediate; R = Resistant **                    P = Positive; N = Negative             MICS are expressed in micrograms per mL    Antibiotic                 RSLT#1    RSLT#2    RSLT#3    RSLT#4 Amoxicillin/Clavulanic Acid    S Ampicillin                     R Cefepime                       S Ceftriaxone                    S Cefuroxime                     S Ciprofloxacin                  S Ertapenem  S Gentamicin                     S Imipenem                       S Levofloxacin                   S Meropenem                       S Nitrofurantoin                 S Piperacillin/Tazobactam        S Tetracycline                   R Tobramycin                     S Trimethoprim/Sulfa             R          Impression:  Patient is 86 year old Caucasian female with a past medical history of end-stage renal disease on hemodialysis-on Monday Wednesday Friday schedule, diastolic CHF, COPD, coronary disease, diabetes mellitus, hypertension, hyperlipidemia, hypothyroidism who is being admitted with chief complaint of weakness and altered mental status  -Metabolic encephalopathy/altered mental status UTI End-stage renal disease Hypertension Hypothyroidism Diabetes mellitus  Patient is known to our clinic and receives outpatient dialysis at [facility] on a MWF schedule supervised by Dr. Thedore Mins.   1)Renal     End-stage renal disease Patient is on hemodialysis Patient is on Monday Wednesday Friday schedule We will dialyze patient today   2)HTN    Blood pressure is stable    3)Anemia of chronic disease     Latest Ref Rng & Units 01/11/2022    5:05 PM 01/03/2022    2:57 PM 09/27/2021    5:46 PM  CBC  WBC 4.0 - 10.5 K/uL 7.1   9.1   7.9    Hemoglobin 12.0 - 15.0 g/dL 6.7   9.3   16.1    Hematocrit 36.0 - 46.0 % 23.2   29.2   34.1    Platelets 150 - 400 K/uL 122   120   66       Patient hemoglobin is low Patient did receive PRBC yesterday We will keep patient on Epogen-we will increase the dose  4) Secondary hyperparathyroidism -CKD Mineral-Bone Disorder/renal failure associated hyperphosphatemia    Lab Results  Component Value Date   PTH 59 08/13/2018   CALCIUM 8.2 (L) 01/11/2022   PHOS 4.7 (H) 09/24/2021      Phosphorus at goal.   5) altered mental status Most likely thought to be secondary to infectious etiologies Primary team is following   6) Electrolytes      Latest Ref Rng & Units 01/11/2022    5:05 PM 01/03/2022    2:57 PM 09/27/2021    5:46 PM   BMP  Glucose 70 - 99 mg/dL 096   045   409    BUN 8 - 23 mg/dL 25   16   55    Creatinine 0.44 - 1.00 mg/dL 8.11   9.14   7.82    Sodium 135 - 145 mmol/L 144   141   135    Potassium 3.5 - 5.1 mmol/L 3.8   3.9   4.3    Chloride 98 - 111 mmol/L 109  107   99    CO2 22 - 32 mmol/L 26   27   26     Calcium 8.9 - 10.3 mg/dL 8.2   7.9   7.9       Sodium Normonatremic   Potassium Normokalemic    7)Acid base   Co2 at goal  8)UTI Patient is on antibiotics Primary team is following   Plan:   We will dialyze patient today We will increase the dose of Epogen      Tobechukwu Emmick s [facility] 01/12/2022, 7:53 AM

## 2022-01-12 NOTE — Assessment & Plan Note (Signed)
-   We will place her on Klonopin and Prozac. ?

## 2022-01-12 NOTE — ED Notes (Signed)
Pt had one episode of vomiting. Was given IV Zofran. Daughter at bedside was concerned for aspiration. Pt was sitting up in bed at the time of episode of vomiting. Breath sounds unchanged, wheezing. Daughter also requesting anxiety medication as pt is now alert and anxious not understanding what is happening around her. Daughter informed to attempt oral Klonopin after onset of Zofran.  ?

## 2022-01-12 NOTE — Assessment & Plan Note (Signed)
-   The patient will be on IV Rocephin and will follow urine culture. ?

## 2022-01-12 NOTE — Progress Notes (Signed)
? ?      CROSS COVER NOTE ? ?NAME: Dawn Bradford ?MRN: [id number] ?DOB : 1936/08/08 ? ? ? ?Date of Service ?  01/12/2022  ?HPI/Events of Note ?  Secure chat received from nursing reporting shortness of breath and wheezing (without crackles) after blood administration earlier tonight and continuous IVF. [name] also vomited while sitting upright and daughter at bedside concerned that patient may have aspirated. No increased work of breathing and no increased oxygenation requirements. ? ?Today's Vitals  ? 01/12/22 0501 01/12/22 0527 01/12/22 0600 01/12/22 0653  ?BP:  (!) 143/88 (!) 120/59   ?Pulse: 96 98 89   ?Resp: (!) 22 (!) 26 (!) 27   ?Temp:  (!) 97.1 ?F (36.2 ?C)    ?TempSrc:  Axillary    ?SpO2: 99% 98% 98%   ?PainSc:    Asleep  ? ?There is no height or weight on file to calculate BMI. ? ?  ?Interventions ?  Plan: ?Stop IVF ?Give PRN albuterol ?CXR ?   ?  ? ?Neomia Glass MHA, MSN, FNP-BC ?Nurse Practitioner ?[facility] ?[place] ?Pager [phone] ? ?

## 2022-01-12 NOTE — Assessment & Plan Note (Signed)
-   The patient will be admitted to a medical telemetry bed. ?- This likely secondary to UTI. ?- We will follow mental status changes and neurochecks every 4 hours for 24 hours. ?- We will manage her UTI as below. ?

## 2022-01-12 NOTE — Progress Notes (Signed)
Anticoagulation monitoring(Lovenox): ? ?86 yo  female ordered Lovenox 40 mg Q24h ?   ?There were no vitals filed for this visit. ?BMI   ? ?Lab Results  ?Component Value Date  ? CREATININE 3.45 (H) 01/11/2022  ? CREATININE 2.21 (H) 01/03/2022  ? CREATININE 2.97 (H) 09/27/2021  ? ?Estimated Creatinine Clearance: 12.9 mL/min (A) (by C-G formula based on SCr of 3.45 mg/dL (H)). ?Hemoglobin & Hematocrit  ?   ?Component Value Date/Time  ? HGB 6.7 (L) 01/11/2022 1705  ? HGB 11.4 (L) 09/15/2014 1032  ? HCT 23.2 (L) 01/11/2022 1705  ? HCT 34.8 (L) 09/15/2014 1032  ? ? ? ?Per Protocol for Patient with estCrcl < 15 ml/min and BMI < 30, will transition ?Transition to heparin 5000 units SQ Q8H.   ?  ? ? ?

## 2022-01-12 NOTE — ED Notes (Signed)
Informed RN bed assigned 

## 2022-01-12 NOTE — ED Notes (Signed)
A second type and screen sent to lab at this time that was drawn by IV team off an ultrasound guided IV. Blood bank states that the sample has clotted and is unable to be used at this time. ?

## 2022-01-12 NOTE — H&P (Addendum)
?  ?  ?[place] ? ? ?PATIENT NAME: Dawn Bradford   ? ?MR#:  [id number] ? ?DATE OF BIRTH:  May 28, 1936 ? ?DATE OF ADMISSION:  01/11/2022 ? ?PRIMARY CARE PHYSICIAN: Pcp, No  ? ?Patient is coming from: Home ? ?REQUESTING/REFERRING PHYSICIAN: Vladimir Crofts, MD ? ?CHIEF COMPLAINT:  ? ?Chief Complaint  ?Patient presents with  ? Altered Mental Status  ? ? ?HISTORY OF PRESENT ILLNESS:  ?Dawn Bradford is a 86 y.o. Caucasian female with medical history significant for end-stage renal disease on hemodialysis on MWF, diastolic CHF, COPD, coronary artery disease, type 2 diabetes mellitus, hypertension, dyslipidemia and hypothyroidism, who presented to the ER with acute onset of altered mental status and generalized weakness with associated flank pain.  Her symptoms have been going on over the last 8 days with progressive worsening.  She apparently was in hemodialysis prior to that and her AVF decannulated with subsequent bleeding and anemia.  Her hemoglobin then dropped to 9.2 and with follow-up with her PCP it dropped to 8.2.  Since then she has been feeling weaker per her caregiver and has been more sleepy per her daughter.  She was seen by her urologist on Thursday and she was having some back pain.  She was thought to have a possible UTI however they wanted to do a urine culture first.  They just called her yesterday a prescription for Cipro that she has not taken yet.  She was recently seen by urology and diagnosed with UTI but has not started antibiotic therapy yet.  Her daughter stated that her stools have been dark with no bright red bleeding per rectum.  No nausea or vomiting or abdominal pain.. ? ?ED Course: Upon presentation to the ER, BP was 106/49 with respiratory to 21 and later 24.  Labs revealed a BUN and creatinine of 25/3.45 with total protein of 5.7 and albumin 2.8.  CBC showed hemoglobin of 6.7 hematocrit of 23.2 with macrocytosis and thrombocytopenia.  Previous H&H were 10.9 and 32.4 on 09/24/2021. ? ?EKG as  reviewed by me : EKG showed atrial fibrillation with controlled ventricular sponsor of 92 with Q waves anteriorly with small Q waves inferiorly.[name] ?Imaging: Noncontrasted CT scan revealed no acute intracranial normalities.  It showed chronic appearing left mastoid sinusitis with chronic left-sided mastoid effusion and chronic ischemic white matter disease. ?Abdominal pelvic CT scan revealed the following: ?1.. Motion degraded study. No definite CT evidence for acute ?intra-abdominal or pelvic abnormality. ?2. Cardiomegaly with moderate right and small left pleural ?effusions. Suspicion of lower lobe bronchial wall thickening which ?could be due to airways inflammatory process ?3. 2.2 cm cystic pancreatic lesion grossly stable in size, no ?specific follow-up is recommended. ?4. Diverticular disease of the colon without acute inflammatory ?process. ? ?The patient was given 1 g of IV Rocephin.  She will be admitted to a medical telemetry bed for further evaluation and management. ?PAST MEDICAL HISTORY:  ? ?Past Medical History:  ?Diagnosis Date  ? ACE-inhibitor cough   ? Aortic valve disorder   ? Echo in 2009 showed mean aortic valve gradient of 13 mmHg, suggesting very mild stenosis. Echo (12/10) suggested aortic sclerosis only  ? Carotid artery disease ([place])   ? mild, carotid dopplers 12/2009  ? CHF (congestive heart failure) ([place])   ? COPD (chronic obstructive pulmonary disease) ([place])   ? Coronary artery disease   ? s/p anterior MI in 1996 followed by CABG. Lexiscan myoview (4/11): EF 67%, normal perfusion with no evidence for ischemia  or infarction.   ? Diabetes mellitus   ? type 2  ? Diastolic heart failure   ? most recent echo (12/10) showed EF 55-60% with mild LVH, grade I diastolic dysfunction, mild LAE.  ? ESRD (end stage renal disease) on dialysis [facility])   ? Hyperlipidemia   ? Hypertension   ? resistant hyptertension times many years. the patient does have renal artery stenosis. she has tried calcium channel  blockers in the past and states that she would not take them now because she had some problems with her gums which her dentist identified as calcium-channel blocker side effects.  ? Hypothyroidism   ? Mild asthma   ? PFT 02/05/10 FEV1 1.38, FEV1% 73, TLC 3.78 (86%), DLCO 48%, +BD  ? Myocardial infarction [facility])   ? Obesity   ? Obesity hypoventilation syndrome ([place])   ?    ? Obstructive sleep apnea   ? PSG 01/05/2006 AHI 24.8, CPAP 9cm H2O  ? Pulmonary nodule, right   ? Renal artery stenosis ([place])   ? The patient has an occluded right renal artery and an atrophic right kidney. There is 20% left renal artery stenosis. This was seen by catheterization in 2007  ? Stroke [facility])   ? ? ?PAST SURGICAL HISTORY:  ? ?Past Surgical History:  ?Procedure Laterality Date  ? A/V FISTULAGRAM N/A 01/25/2018  ? Procedure: A/V FISTULAGRAM;  Surgeon: Algernon Huxley, MD;  Location: [place] CV LAB;  Service: Cardiovascular;  Laterality: N/A;  ? A/V FISTULAGRAM Left 02/21/2019  ? Procedure: A/V FISTULAGRAM;  Surgeon: Algernon Huxley, MD;  Location: [place] CV LAB;  Service: Cardiovascular;  Laterality: Left;  ? A/V FISTULAGRAM Left 10/24/2019  ? Procedure: A/V FISTULAGRAM;  Surgeon: Algernon Huxley, MD;  Location: [place] CV LAB;  Service: Cardiovascular;  Laterality: Left;  ? ABDOMINAL HYSTERECTOMY  1985  ? AV FISTULA PLACEMENT    ? CATARACT EXTRACTION  2010  ? CORONARY ARTERY BYPASS GRAFT  1996  ? DIALYSIS FISTULA CREATION    ? [place]  ? right  ? INTRAMEDULLARY (IM) NAIL INTERTROCHANTERIC Right 08/17/2018  ? Procedure: INTRAMEDULLARY (IM) NAIL INTERTROCHANTRIC;  Surgeon: Earnestine Leys, MD;  Location: [facility] ORS;  Service: Orthopedics;  Laterality: Right;  ? PERIPHERAL VASCULAR BALLOON ANGIOPLASTY  02/21/2019  ? Procedure: PERIPHERAL VASCULAR BALLOON ANGIOPLASTY;  Surgeon: Algernon Huxley, MD;  Location: [place] CV LAB;  Service: Cardiovascular;;  ? PERIPHERAL VASCULAR CATHETERIZATION Left 01/06/2015  ? Procedure: A/V  Shuntogram/Fistulagram;  Surgeon: Katha Cabal, MD;  Location: [place] [facility] LAB;  Service: Cardiovascular;  Laterality: Left;  ? PERIPHERAL VASCULAR CATHETERIZATION N/A 02/17/2015  ? Procedure: Dialysis/Perma Catheter Removal;  Surgeon: Katha Cabal, MD;  Location: [place] CV LAB;  Service: Cardiovascular;  Laterality: N/A;  ? PERIPHERAL VASCULAR CATHETERIZATION N/A 01/28/2016  ? Procedure: A/V Shuntogram/Fistulagram;  Surgeon: Algernon Huxley, MD;  Location: [place] CV LAB;  Service: Cardiovascular;  Laterality: N/A;  ? PERIPHERAL VASCULAR CATHETERIZATION N/A 01/28/2016  ? Procedure: A/V Shunt Intervention;  Surgeon: Algernon Huxley, MD;  Location: [place] CV LAB;  Service: Cardiovascular;  Laterality: N/A;  ? TUBAL LIGATION  1973  ? ? ?SOCIAL HISTORY:  ? ?Social History  ? ?Tobacco Use  ? Smoking status: Never  ? Smokeless tobacco: Never  ?Substance Use Topics  ? Alcohol use: No  ? ? ?FAMILY HISTORY:  ? ?Family History  ?Problem Relation Age of Onset  ? Kidney failure Mother   ?  Diabetes Mother   ? Aortic aneurysm Father   ? Coronary artery disease Father   ? Heart attack Father   ? Diabetes Sister   ? Diabetes Brother   ? Kidney failure Brother   ? ? ?DRUG ALLERGIES:  ? ?Allergies  ?Allergen Reactions  ? Ace Inhibitors Other (See Comments)  ?  Reaction:  Unknown   ? Amlodipine Other (See Comments)  ?  Reaction:  Unknown   ? Clonidine Hydrochloride Other (See Comments)  ?  Reaction:  Unknown   ? Codeine Hives  ? Latex Hives  ? Metformin Nausea And Vomiting  ? Tape Rash  ? ? ?REVIEW OF SYSTEMS:  ? ?ROS ?As per history of present illness. All pertinent systems were reviewed above. Constitutional, HEENT, cardiovascular, respiratory, GI, GU, musculoskeletal, neuro, psychiatric, endocrine, integumentary and hematologic systems were reviewed and are otherwise negative/unremarkable except for positive findings mentioned above in the HPI. ? ? ?MEDICATIONS AT HOME:  ? ?Prior to Admission medications    ?Medication Sig Start Date End Date Taking? Authorizing Provider  ?acetaminophen (TYLENOL) 325 MG tablet Take 2 tablets (650 mg total) by mouth every 6 (six) hours as needed for mild pain, fever or headache (or F

## 2022-01-13 DIAGNOSIS — G9341 Metabolic encephalopathy: Secondary | ICD-10-CM

## 2022-01-13 LAB — MAGNESIUM: Magnesium: 1.7 mg/dL (ref 1.7–2.4)

## 2022-01-13 LAB — BASIC METABOLIC PANEL
Anion gap: 6 (ref 5–15)
BUN: 17 mg/dL (ref 8–23)
CO2: 27 mmol/L (ref 22–32)
Calcium: 8.3 mg/dL — ABNORMAL LOW (ref 8.9–10.3)
Chloride: 103 mmol/L (ref 98–111)
Creatinine, Ser: 2.7 mg/dL — ABNORMAL HIGH (ref 0.44–1.00)
GFR, Estimated: 17 mL/min — ABNORMAL LOW (ref >60)
Glucose, Bld: 118 mg/dL — ABNORMAL HIGH (ref 70–99)
Potassium: 4.2 mmol/L (ref 3.5–5.1)
Sodium: 136 mmol/L (ref 135–145)

## 2022-01-13 LAB — TYPE AND SCREEN
ABO/RH(D): O POS
Antibody Screen: NEGATIVE
Unit division: 0

## 2022-01-13 LAB — CBC
HCT: 27.7 % — ABNORMAL LOW (ref 36.0–46.0)
Hemoglobin: 9 g/dL — ABNORMAL LOW (ref 12.0–15.0)
MCH: 32.5 pg (ref 26.0–34.0)
MCHC: 32.5 g/dL (ref 30.0–36.0)
MCV: 100 fL (ref 80.0–100.0)
Platelets: 122 10*3/uL — ABNORMAL LOW (ref 150–400)
RBC: 2.77 MIL/uL — ABNORMAL LOW (ref 3.87–5.11)
RDW: 18.7 % — ABNORMAL HIGH (ref 11.5–15.5)
WBC: 9.3 10*3/uL (ref 4.0–10.5)
nRBC: 0.3 % — ABNORMAL HIGH (ref 0.0–0.2)

## 2022-01-13 LAB — GLUCOSE, CAPILLARY
Glucose-Capillary: 116 mg/dL — ABNORMAL HIGH (ref 70–99)
Glucose-Capillary: 140 mg/dL — ABNORMAL HIGH (ref 70–99)
Glucose-Capillary: 117 mg/dL — ABNORMAL HIGH (ref 70–99)
Glucose-Capillary: 135 mg/dL — ABNORMAL HIGH (ref 70–99)

## 2022-01-13 LAB — BPAM RBC
Blood Product Expiration Date: 202306142359
ISSUE DATE / TIME: 202305100224
Unit Type and Rh: 5100

## 2022-01-13 MED ORDER — SODIUM CHLORIDE 0.9 % IV SOLN: INTRAVENOUS | Status: DC | PRN

## 2022-01-13 NOTE — Evaluation (Addendum)
Clinical/Bedside Swallow Evaluation ?Patient Details  ?Name: Dawn Bradford ?MRN: [id number] ?Date of Birth: 1936/04/17 ? ?Today's Date: 01/13/2022 ?Time: SLP Start Time (ACUTE ONLY): 6659 SLP Stop Time (ACUTE ONLY): 1435 ?SLP Time Calculation (min) (ACUTE ONLY): 50 min ? ?Past Medical History:  ?Past Medical History:  ?Diagnosis Date  ? ACE-inhibitor cough   ? Aortic valve disorder   ? Echo in 2009 showed mean aortic valve gradient of 13 mmHg, suggesting very mild stenosis. Echo (12/10) suggested aortic sclerosis only  ? Carotid artery disease ([place])   ? mild, carotid dopplers 12/2009  ? CHF (congestive heart failure) ([place])   ? COPD (chronic obstructive pulmonary disease) ([place])   ? Coronary artery disease   ? s/p anterior MI in 1996 followed by CABG. Lexiscan myoview (4/11): EF 67%, normal perfusion with no evidence for ischemia or infarction.   ? Diabetes mellitus   ? type 2  ? Diastolic heart failure   ? most recent echo (12/10) showed EF 55-60% with mild LVH, grade I diastolic dysfunction, mild LAE.  ? ESRD (end stage renal disease) on dialysis [facility])   ? Hyperlipidemia   ? Hypertension   ? resistant hyptertension times many years. the patient does have renal artery stenosis. she has tried calcium channel blockers in the past and states that she would not take them now because she had some problems with her gums which her dentist identified as calcium-channel blocker side effects.  ? Hypothyroidism   ? Mild asthma   ? PFT 02/05/10 FEV1 1.38, FEV1% 73, TLC 3.78 (86%), DLCO 48%, +BD  ? Myocardial infarction [facility])   ? Obesity   ? Obesity hypoventilation syndrome ([place])   ?    ? Obstructive sleep apnea   ? PSG 01/05/2006 AHI 24.8, CPAP 9cm H2O  ? Pulmonary nodule, right   ? Renal artery stenosis ([place])   ? The patient has an occluded right renal artery and an atrophic right kidney. There is 20% left renal artery stenosis. This was seen by catheterization in 2007  ? Stroke [facility])   ? ?Past Surgical History:  ?Past Surgical History:   ?Procedure Laterality Date  ? A/V FISTULAGRAM N/A 01/25/2018  ? Procedure: A/V FISTULAGRAM;  Surgeon: Algernon Huxley, MD;  Location: [place] CV LAB;  Service: Cardiovascular;  Laterality: N/A;  ? A/V FISTULAGRAM Left 02/21/2019  ? Procedure: A/V FISTULAGRAM;  Surgeon: Algernon Huxley, MD;  Location: [place] CV LAB;  Service: Cardiovascular;  Laterality: Left;  ? A/V FISTULAGRAM Left 10/24/2019  ? Procedure: A/V FISTULAGRAM;  Surgeon: Algernon Huxley, MD;  Location: [place] CV LAB;  Service: Cardiovascular;  Laterality: Left;  ? ABDOMINAL HYSTERECTOMY  1985  ? AV FISTULA PLACEMENT    ? CATARACT EXTRACTION  2010  ? CORONARY ARTERY BYPASS GRAFT  1996  ? DIALYSIS FISTULA CREATION    ? [place]  ? right  ? INTRAMEDULLARY (IM) NAIL INTERTROCHANTERIC Right 08/17/2018  ? Procedure: INTRAMEDULLARY (IM) NAIL INTERTROCHANTRIC;  Surgeon: Earnestine Leys, MD;  Location: [facility] ORS;  Service: Orthopedics;  Laterality: Right;  ? PERIPHERAL VASCULAR BALLOON ANGIOPLASTY  02/21/2019  ? Procedure: PERIPHERAL VASCULAR BALLOON ANGIOPLASTY;  Surgeon: Algernon Huxley, MD;  Location: [place] CV LAB;  Service: Cardiovascular;;  ? PERIPHERAL VASCULAR CATHETERIZATION Left 01/06/2015  ? Procedure: A/V Shuntogram/Fistulagram;  Surgeon: Katha Cabal, MD;  Location: [place] CV LAB;  Service: Cardiovascular;  Laterality: Left;  ? PERIPHERAL VASCULAR CATHETERIZATION N/A 02/17/2015  ? Procedure: Dialysis/Perma  Catheter Removal;  Surgeon: Katha Cabal, MD;  Location: [place] CV LAB;  Service: Cardiovascular;  Laterality: N/A;  ? PERIPHERAL VASCULAR CATHETERIZATION N/A 01/28/2016  ? Procedure: A/V Shuntogram/Fistulagram;  Surgeon: Algernon Huxley, MD;  Location: [place] CV LAB;  Service: Cardiovascular;  Laterality: N/A;  ? PERIPHERAL VASCULAR CATHETERIZATION N/A 01/28/2016  ? Procedure: A/V Shunt Intervention;  Surgeon: Algernon Huxley, MD;  Location: [place] CV LAB;  Service: Cardiovascular;  Laterality:  N/A;  ? TUBAL LIGATION  1973  ? ?HPI:  ?Pt is a 86 y.o. Caucasian female with medical history significant for end-stage renal disease on hemodialysis on MWF, Obesity, diastolic CHF, COPD, coronary artery disease, heart failure, ERSD, type 2 diabetes mellitus, hypertension, dyslipidemia and hypothyroidism, who presented to the ER with acute onset of altered mental status and generalized weakness with associated flank pain.  Her symptoms have been going on over the last 8 days with progressive worsening.  She apparently was in hemodialysis prior to that and her AVF decannulated with subsequent bleeding and anemia.  Her hemoglobin then dropped to 9.2 and with follow-up with her PCP it dropped to 8.2.  Since then she has been feeling weaker per her caregiver and has been more sleepy per her daughter.  She was seen by her urologist on Thursday and she was having some back pain.  She was thought to have a possible UTI however they wanted to do a urine culture first.  They just called her yesterday a prescription for Cipro that she has not taken yet.  She was recently seen by urology and diagnosed with UTI but has not started antibiotic therapy yet.   ? ?CT of [facility]uns: "Lung bases demonstrate small left and moderate right  pleural effusion. Lower lobe bronchial wall thickening without  consolidation. Cardiomegaly with coronary vascular calcification.  Mitral calcification.".   ?OF NOTE: Caregiver stated that pt's Speech Therapist had recently downgraded pt's liquids to a "thickened liquid" consistency d/t concerns of coughing and aspiration.  ALSO: Pt had a Vomiting episode while in the ED this admit.  ? ?  ?Assessment / Plan / Recommendation  ?Clinical Impression ? Pt seen for BSE at her Lunch meal today. Pt has been drowsy this morning but overall improved per Daughter's, Caregiver report to MD this morning. Pt was drowsy during session but awake; she opened her eyes easily given verbal stim by Caregiver and this SLP.  She spoke a few words; Confusion apparent in attempts to self-feed, orient proprioceptively, follow through w/ tasks. She did not immediately answer basic Y/N questions. ? ?Caregiver stated that "pt's Speech Therapist had recently downgraded pt's liquids to a "thickened liquid" consistency d/t concerns of coughing and aspiration.".  ? ?In setting of pt's dx'd Dysphagia per [facility] Speech Therapist and pt's declined medical and Cognitive status', pt appears at increased risk for aspiration w/ risk for pulmonary impact/decline. Only trials of Nectar and Honey consistency liquids following aspiration precautions were presented during this evaluation in order to reduce risk for aspiration. ANY Cognitive decline can impact overall awareness/timing of swallow and safety during po tasks which increases risk for aspiration, choking.  ?Pt appears to present w/ oropharyngeal phase dysphagia. Pt's risk for aspiration can be reduced when following aspiration precautions, verbal cues and feeding support at meals, and using a modified diet consistency w/ Nectar liquids as has been recommended during recent admit in 09/2021, and by [facility] SLP.  ? ?Pt consumed several trials of purees w/ MINCED solids and  Nectar and Honey consistency liquids via Cup/tsp w/ No overt clinical s/s of aspiration noted; No immediate, overt coughing, no decline in vocal quality, and no decline in respiratory status during/post trials. Deficits in the oral phase were c/b prolonged bolus management and mastication followed by diffuse oral residue w/ Minced food trials requiring extra Time for pt to use lingual sweeping and f/u swallows to fully clear her mouth. Alternating foods/liquids appeared to aid oral clearing as well. Pt attempted self-feeding but required Mod-Max support and guidance d/t Cognitive status decline. She was intermittently able to hold own Cup during drinking (which improves safety of swallowing) but w/ poor proprioceptive awareness. OM Exam  appeared [facility] w/ No unilateral weakness noted during bolus management.  ? ?D/t pt's overall presentation as above, and her risk for aspiration, recommend continue the dysphagia level 2(MINCED foods moistene

## 2022-01-13 NOTE — Evaluation (Signed)
Occupational Therapy Evaluation ?Patient Details ?Name: Dawn Bradford ?MRN: [id number] ?DOB: Jul 29, 1936 ?Today's Date: 01/13/2022 ? ? ?History of Present Illness Pt is an 86 year old with history of ESRD on HD MWF, diastolic CHF, COPD, CAD, DM 2, HTN, HLD, hypothyroidism admitted for weakness and altered mental status worsening over the past 8 days.  Recently had her AV fistula decannulated with some drop in hemoglobin.  She also saw outpatient neurology where she was diagnosed with possible UTI.  ? ?Clinical Impression ?  ?Ms Pall was seen for OT evaluation this date. Prior to hospital admission, pt required assist from caregivers for transfers to/from w/c. Pt lives with 24/7 assistance in home c ramped entrance. Pt presents to acute OT demonstrating impaired ADL performance and functional mobility 2/2 decreased activity tolerance and functional strength/ROM/balance deficits.  ? ?Upon arrival pt sidelying with NT, requires MOD A sidelying>sitting. MAX A don B socks, MOD A don gown seated EOB. MAX A x2 sit<.stand decreasing to TOTAL A x2 for SPT. MAX A self-feeding reclined in chair - limited by lethargy. Pt would benefit from skilled OT to address noted impairments and functional limitations (see below for any additional details). Upon hospital discharge, recommend HHOT to maximize pt safety and return to PLOF.  ? ?Recommendations for follow up therapy are one component of a multi-disciplinary discharge planning process, led by the attending physician.  Recommendations may be updated based on patient status, additional functional criteria and insurance authorization.  ? ?Follow Up Recommendations ? Home health OT  ?  ?Assistance Recommended at Discharge Frequent or constant Supervision/Assistance  ?Patient can return home with the following Two people to help with walking and/or transfers;Two people to help with bathing/dressing/bathroom ? ?  ?Functional Status Assessment ? Patient has had a recent decline in their  functional status and demonstrates the ability to make significant improvements in function in a reasonable and predictable amount of time.  ?Equipment Recommendations ? Hospital bed;Other (comment) (hoyer lift)  ?  ?Recommendations for Other Services   ? ? ?  ?Precautions / Restrictions Precautions ?Precautions: Fall ?Restrictions ?Weight Bearing Restrictions: No  ? ?  ? ?Mobility Bed Mobility ?Overal bed mobility: Needs Assistance ?Bed Mobility: Rolling, Sidelying to Sit ?Rolling: Max assist ?Sidelying to sit: Mod assist ?  ?  ?  ?  ?  ? ?Transfers ?Overall transfer level: Needs assistance ?Equipment used: 2 person hand held assist ?Transfers: Sit to/from Stand, Bed to chair/wheelchair/BSC ?Sit to Stand: Max assist, +2 physical assistance ?  ?Squat pivot transfers: Total assist, +2 physical assistance ?  ?  ?  ?  ?  ? ?  ?Balance Overall balance assessment: Needs assistance ?Sitting-balance support: Feet supported, Bilateral upper extremity supported ?Sitting balance-Leahy Scale: Poor ?Sitting balance - Comments: MIN A improving to intermittent CGA ?Postural control: Right lateral lean ?  ?Standing balance-Leahy Scale: Zero ?  ?  ?  ?  ?  ?  ?  ?  ?  ?  ?  ?  ?   ? ?ADL either performed or assessed with clinical judgement  ? ?ADL Overall ADL's : Needs assistance/impaired ?  ?  ?  ?  ?  ?  ?  ?  ?  ?  ?  ?  ?  ?  ?  ?  ?  ?  ?  ?General ADL Comments: MAX A don B socks, MOD A don gown seated EOB. TOTAL A x2 for ADL t/f. MAX A self-feeding reclined in chair - limited by lethargy.  ? ? ? ? ?  Pertinent Vitals/Pain Pain Assessment ?Pain Assessment: Faces ?Faces Pain Scale: No hurt  ? ? ? ?Hand Dominance Right ?  ?Extremity/Trunk Assessment Upper Extremity Assessment ?Upper Extremity Assessment: Generalized weakness ?  ?Lower Extremity Assessment ?Lower Extremity Assessment: Generalized weakness ?  ?Cervical / Trunk Assessment ?Cervical / Trunk Assessment: Kyphotic ?  ?Communication Communication ?Communication: HOH ?   ?Cognition Arousal/Alertness: Awake/alert (periods of sleepiness) ?Behavior During Therapy: Flat affect ?Overall Cognitive Status: History of cognitive impairments - at baseline ?  ?  ?  ?  ?  ?  ?  ?  ?  ?  ?  ?  ?  ?  ?  ?  ?General Comments: pt oriented to self, stated she did not know where she was ?  ?  ?General Comments  HR 1122 ? ?  ? ?Home Living Family/patient expects to be discharged to:: Private residence ?  ?Available Help at Discharge: Family;Personal care attendant;Available 24 hours/day ?Type of Home: House ?Home Access: Ramped entrance ?  ?  ?Home Layout: One level ?  ?  ?Bathroom Shower/Tub: Sponge bathes at baseline ?  ?  ?  ?  ?Home Equipment: Wheelchair - Publishing copy (2 wheels) ?  ?  ?  ? ?  ?Prior Functioning/Environment Prior Level of Function : Needs assist ? Cognitive Assist : Mobility (cognitive);ADLs (cognitive) ?Mobility (Cognitive): Step by step cues ?ADLs (Cognitive): Step by step cues ?Physical Assist : Mobility (physical);ADLs (physical) ?Mobility (physical): Bed mobility;Transfers ?ADLs (physical): Grooming;Feeding;Bathing;Dressing;Toileting ?  ?ADLs Comments: pt self-feeds wiht SETUP and washes face/hands. Caregiver performs dressing/bathing ?  ? ?  ?  ?OT Problem List: Decreased strength;Decreased range of motion;Decreased activity tolerance;Impaired balance (sitting and/or standing);Decreased safety awareness ?  ?   ?OT Treatment/Interventions: Self-care/ADL training;Therapeutic exercise;Energy conservation;DME and/or AE instruction;Therapeutic activities;Patient/family education;Balance training  ?  ?OT Goals(Current goals can be found in the care plan section) Acute Rehab OT Goals ?Patient Stated Goal: to go home ?OT Goal Formulation: With patient/family ?Time For Goal Achievement: 01/27/22 ?Potential to Achieve Goals: Fair ?ADL Goals ?Pt Will Perform Grooming: with min guard assist;sitting ?Pt Will Perform Upper Body Bathing: with min assist;sitting ?Pt Will Transfer  to Toilet: with mod assist;with +2 assist;stand pivot transfer;bedside commode  ?OT Frequency: Min 2X/week ?  ? ?Co-evaluation PT/OT/SLP Co-Evaluation/Treatment: Yes ?Reason for Co-Treatment: For patient/therapist safety;To address functional/ADL transfers;Necessary to address cognition/behavior during functional activity ?PT goals addressed during session: Mobility/safety with mobility;Balance ?OT goals addressed during session: ADL's and self-care ?  ? ?  ?AM-PAC OT "6 Clicks" Daily Activity     ?Outcome Measure Help from another person eating meals?: A Lot ?Help from another person taking care of personal grooming?: A Lot ?Help from another person toileting, which includes using toliet, bedpan, or urinal?: A Lot ?Help from another person bathing (including washing, rinsing, drying)?: A Lot ?Help from another person to put on and taking off regular upper body clothing?: A Lot ?Help from another person to put on and taking off regular lower body clothing?: A Lot ?6 Click Score: 12 ?  ?End of Session Equipment Utilized During Treatment: Gait belt ?Nurse Communication: Mobility status ? ?Activity Tolerance: Patient tolerated treatment well ?Patient left: in chair;with call bell/phone within reach;with chair alarm set;with family/visitor present ? ?OT Visit Diagnosis: Other abnormalities of gait and mobility (R26.89);Muscle weakness (generalized) (M62.81)  ?              ?Time: 2458-0998 ?OT Time Calculation (min): 23 min ?Charges:  OT General Charges ?$OT Visit: 1  Visit ?OT Evaluation ?$OT Eval Moderate Complexity: 1 Mod ?OT Treatments ?$Self Care/Home Management : 8-22 mins ? ?Dessie Coma, M.S. OTR/L  ?01/13/22, 9:52 AM  ?ascom [phone] ? ?

## 2022-01-13 NOTE — Progress Notes (Signed)
Dawn Bradford  MRN: [id number]  DOB/AGE: 1935/12/16 86 y.o.  Primary Care Physician:Pcp, No  Admit date: 01/11/2022  Chief Complaint:  Chief Complaint  Patient presents with   Altered Mental Status    S-Pt presented on  01/11/2022 with  Chief Complaint  Patient presents with   Altered Mental Status  . Patient is a 86 year old Caucasian female with a past medical history of ESRD, on hemodialysis, on Monday Wednesday Friday schedule, chronic diastolic CHF, COPD, cardiac disease, diabetes mellitus type 2, hypertension, dyslipidemia, hypothyroidism who was brought to the hospital with chief complaint of altered mental status.    Patient was admitted for further care Nephrology was consulted for comanagement of dialysis patient  Patient is known to our clinic and receives outpatient dialysis at [facility] on a MWF schedule supervised by Dr. Thedore Mins.   Patient was seen today on second floor Patient offers no new specific physical complaints   Medication     aspirin EC  81 mg Oral Daily   carvedilol  25 mg Oral Q supper   carvedilol  25 mg Oral Once per day on Sun Tue Thu Sat   Chlorhexidine Gluconate Cloth  6 each Topical Q0600   epoetin (EPOGEN/PROCRIT) injection  10,000 Units Intravenous Q M,W,F-HD   FLUoxetine  40 mg Oral Daily   heparin injection (subcutaneous)  5,000 Units Subcutaneous Q8H   hyoscyamine  0.125 mg Oral Q4H   insulin aspart  0-15 Units Subcutaneous TID AC & HS   irbesartan  75 mg Oral Daily   isosorbide dinitrate  5 mg Oral QHS   levothyroxine  75 mcg Oral Q0600   mouth rinse  15 mL Mouth Rinse BID   midodrine  5 mg Oral Q M,W,F-HD   pantoprazole (PROTONIX) IV  40 mg Intravenous Q12H   polyethylene glycol  17 g Oral Daily   rosuvastatin  40 mg Oral Daily         ROS: Unable to get much data  Physical Exam: Vital signs in last 24 hours: Temp:  [97.2 F (36.2 C)-97.9 F (36.6 C)] 97.4 F (36.3 C) (05/11 0749) Pulse Rate:  [69-115]  95 (05/11 0749) Resp:  [18-26] 20 (05/11 0749) BP: (98-132)/(47-92) 118/76 (05/11 0749) SpO2:  [97 %-100 %] 100 % (05/11 0749) Weight:  [79.3 kg-84 kg] 84 kg (05/11 0309) Weight change:     Intake/Output from previous day: 05/10 0701 - 05/11 0700 In: 100 [IV Piggyback:100] Out: 1000  No intake/output data recorded.   Physical Exam:  General- pt is confused, unable to follow commands, chronically ill-appearing  HEENT-head is atraumatic normocephalic  Resp- No acute REsp distress, rhonchi present  CVS- S1S2 regular in rate and rhythm  GIT- BS+, soft, Non tender , Non distended  EXT- No LE Edema,  No Cyanosis  Access- LUE AVF  Psych patient has poor judgment and insight  Lab Results:  CBC  Recent Labs    01/12/22 0913 01/13/22 0546  WBC 7.6 9.3  HGB 9.0* 9.0*  HCT 28.7* 27.7*  PLT 123* 122*    BMET  Recent Labs    01/12/22 0913 01/13/22 0546  NA 142 136  K 4.0 4.2  CL 106 103  CO2 28 27  GLUCOSE 140* 118*  BUN 26* 17  CREATININE 3.78* 2.70*  CALCIUM 8.2* 8.3*      Most recent Creatinine trend  Lab Results  Component Value Date   CREATININE 2.70 (H) 01/13/2022   CREATININE 3.78 (H) 01/12/2022  CREATININE 3.45 (H) 01/11/2022      MICRO   Recent Results (from the past 240 hour(s))  Microscopic Examination     Status: Abnormal   Collection Time: 01/06/22  2:17 PM   Urine  Result Value Ref Range Status   WBC, UA 11-30 (A) 0 - 5 /hpf Final   RBC 0-2 0 - 2 /hpf Final   Epithelial Cells (non renal) 0-10 0 - 10 /hpf Final   Bacteria, UA Moderate (A) None seen/Few Final  CULTURE, URINE COMPREHENSIVE     Status: Abnormal   Collection Time: 01/06/22  2:57 PM   Specimen: Urine   UR  Result Value Ref Range Status   Urine Culture, Comprehensive Final report (A)  Final   Organism ID, Bacteria Escherichia coli (A)  Final    Comment: Cefazolin <=4 ug/mL Cefazolin with an MIC <=16 predicts susceptibility to the oral agents cefaclor, cefdinir,  cefpodoxime, cefprozil, cefuroxime, cephalexin, and loracarbef when used for therapy of uncomplicated urinary tract infections due to E. coli, Klebsiella pneumoniae, and Proteus mirabilis. Greater than 100,000 colony forming units per mL    ANTIMICROBIAL SUSCEPTIBILITY Comment  Final    Comment:       ** S = Susceptible; I = Intermediate; R = Resistant **                    P = Positive; N = Negative             MICS are expressed in micrograms per mL    Antibiotic                 RSLT#1    RSLT#2    RSLT#3    RSLT#4 Amoxicillin/Clavulanic Acid    S Ampicillin                     R Cefepime                       S Ceftriaxone                    S Cefuroxime                     S Ciprofloxacin                  S Ertapenem                      S Gentamicin                     S Imipenem                       S Levofloxacin                   S Meropenem                      S Nitrofurantoin                 S Piperacillin/Tazobactam        S Tetracycline                   R Tobramycin                     S Trimethoprim/Sulfa  R   MRSA Next Gen by PCR, Nasal     Status: None   Collection Time: 01/12/22  6:23 PM   Specimen: Nasal Mucosa; Nasal Swab  Result Value Ref Range Status   MRSA by PCR Next Gen NOT DETECTED NOT DETECTED Final    Comment: (NOTE) The GeneXpert MRSA Assay (FDA approved for NASAL specimens only), is one component of a comprehensive MRSA colonization surveillance program. It is not intended to diagnose MRSA infection nor to guide or monitor treatment for MRSA infections. Test performance is not FDA approved in patients less than 88 years old. Performed at [facility], [address] [place], [place] [address]          Impression:  Patient is 86 year old Caucasian female with a past medical history of end-stage renal disease on hemodialysis-on Monday Wednesday Friday schedule, diastolic CHF, COPD, coronary disease, diabetes  mellitus, hypertension, hyperlipidemia, hypothyroidism who is being admitted with chief complaint of weakness and altered mental status  -Metabolic encephalopathy/altered mental status UTI End-stage renal disease Hypertension Hypothyroidism Diabetes mellitus  Patient is known to our clinic and receives outpatient dialysis at [facility] on a MWF schedule supervised by Dr. Thedore Mins.   1)Renal     End-stage renal disease Patient is on hemodialysis Patient is on Monday Wednesday Friday schedule Patient was last  dialyzed yesterday-Wednesday  No need for dialysis today   2)HTN    Blood pressure is stable    3)Anemia of chronic disease     Latest Ref Rng & Units 01/13/2022    5:46 AM 01/12/2022    9:13 AM 01/11/2022    5:05 PM  CBC  WBC 4.0 - 10.5 K/uL 9.3   7.6   7.1    Hemoglobin 12.0 - 15.0 g/dL 9.0   9.0   6.7    Hematocrit 36.0 - 46.0 % 27.7   28.7   23.2    Platelets 150 - 400 K/uL 122   123   122       Patient hemoglobin was low, now better  Patient did receive PRBC yesterday  patient on Epogen-dose adjusted   4) Secondary hyperparathyroidism -CKD Mineral-Bone Disorder/renal failure associated hyperphosphatemia    Lab Results  Component Value Date   PTH 59 08/13/2018   CALCIUM 8.3 (L) 01/13/2022   PHOS 4.7 (H) 09/24/2021      Phosphorus at goal.   5) altered mental status Most likely thought to be secondary to infectious etiologies Primary team is following   6) Electrolytes      Latest Ref Rng & Units 01/13/2022    5:46 AM 01/12/2022    9:13 AM 01/11/2022    5:05 PM  BMP  Glucose 70 - 99 mg/dL 604   540   981    BUN 8 - 23 mg/dL 17   26   25     Creatinine 0.44 - 1.00 mg/dL 1.91   4.78   2.95    Sodium 135 - 145 mmol/L 136   142   144    Potassium 3.5 - 5.1 mmol/L 4.2   4.0   3.8    Chloride 98 - 111 mmol/L 103   106   109    CO2 22 - 32 mmol/L 27   28   26     Calcium 8.9 - 10.3 mg/dL 8.3   8.2   8.2        Sodium Normonatremic   Potassium Normokalemic    7)Acid base  Co2 at goal  8)UTI Patient is on antibiotics Primary team is following   Plan:   We will continue current care No need for dialysis today We will dialyze patient tomorrow     Letricia Krinsky s Wolfgang [place] 01/13/2022, 9:36 AM

## 2022-01-13 NOTE — Evaluation (Signed)
Physical Therapy Evaluation ?Patient Details ?Name: Dawn Bradford ?MRN: [id number] ?DOB: 09/29/1935 ?Today's Date: 01/13/2022 ? ?History of Present Illness ? Pt is an 86 year old with history of ESRD on HD MWF, diastolic CHF, COPD, CAD, DM 2, HTN, HLD, hypothyroidism admitted for weakness and altered mental status worsening over the past 8 days.  Recently had her AV fistula decannulated with some drop in hemoglobin.  She also saw outpatient neurology where she was diagnosed with possible UTI. ?  ?Clinical Impression ? Patient was alert, with CNA at bedside changing bed linens. Per family and caregiver at baseline the patient transfers with 1 person assist to [facility], but that has recently become more difficult. Has had PT at home 1x a week as well. Assist for all ADLs, bed baths at baseline, pt normally able to feed herself. ? ?The pt was oriented to self, reported "I don't know where I am". Did tend to become sleepy throughout session but redirectable. Pt performed rolling with maxA. Sidelying to sit with modA, pt able to initiate movements and assist with trunk elevation. Several minutes spent in sitting, progressed to CGA occasionally (R lateral lean present initially). Sit <> stand with maxAx2 and handheld assist. Ultimately totalAx2 to stand pivot to recliner at bedside.  Overall the patient demonstrated deficits (see "PT Problem List") that impede the patient's functional abilities, safety, and mobility and would benefit from skilled PT intervention. Recommendation is HHPT with 24/7 supervision/assistance.  ?   ?   ? ?Recommendations for follow up therapy are one component of a multi-disciplinary discharge planning process, led by the attending physician.  Recommendations may be updated based on patient status, additional functional criteria and insurance authorization. ? ?Follow Up Recommendations Home health PT ? ?  ?Assistance Recommended at Discharge Frequent or constant Supervision/Assistance  ?Patient can return  home with the following ? Two people to help with walking and/or transfers;A lot of help with bathing/dressing/bathroom;Assistance with feeding;Assist for transportation;Assistance with cooking/housework;Direct supervision/assist for financial management;Help with stairs or ramp for entrance;Direct supervision/assist for medications management ? ?  ?Equipment Recommendations Other (comment) (hoyer lift)  ?Recommendations for Other Services ?    ?  ?Functional Status Assessment Patient has had a recent decline in their functional status and demonstrates the ability to make significant improvements in function in a reasonable and predictable amount of time.  ? ?  ?Precautions / Restrictions Precautions ?Precautions: Fall ?Restrictions ?Weight Bearing Restrictions: No  ? ?  ? ?Mobility ? Bed Mobility ?Overal bed mobility: Needs Assistance ?Bed Mobility: Rolling, Sidelying to Sit ?Rolling: Max assist ?Sidelying to sit: Mod assist ?  ?  ?  ?  ?  ? ?Transfers ?Overall transfer level: Needs assistance ?Equipment used: 2 person hand held assist ?Transfers: Sit to/from Stand, Bed to chair/wheelchair/BSC ?Sit to Stand: Max assist, +2 physical assistance ?  ?  ?Squat pivot transfers: Total assist, +2 physical assistance ?  ?  ?  ?  ? ?Ambulation/Gait ?  ?  ?  ?  ?  ?  ?  ?  ? ?Stairs ?  ?  ?  ?  ?  ? ?Wheelchair Mobility ?  ? ?Modified Rankin (Stroke Patients Only) ?  ? ?  ? ?Balance Overall balance assessment: Needs assistance ?Sitting-balance support: Feet supported, Bilateral upper extremity supported ?Sitting balance-Leahy Scale: Poor ?Sitting balance - Comments: progressed to occasional ability to maintain seated upright position with CGA ?Postural control: Right lateral lean ?  ?Standing balance-Leahy Scale: Zero ?  ?  ?  ?  ?  ?  ?  ?  ?  ?  ?  ?  ?   ? ? ? ?  Pertinent Vitals/Pain Pain Assessment ?Pain Assessment: Faces ?Faces Pain Scale: No hurt  ? ? ?Home Living Family/patient expects to be discharged to:: Private  residence ?  ?Available Help at Discharge: Family;Personal care attendant;Available 24 hours/day ?Type of Home: House ?Home Access: Ramped entrance ?  ?  ?  ?Home Layout: One level ?Home Equipment: Wheelchair - Publishing copy (2 wheels) ?   ?  ?Prior Function Prior Level of Function : Needs assist ? Cognitive Assist : Mobility (cognitive);ADLs (cognitive) ?Mobility (Cognitive): Step by step cues ?ADLs (Cognitive): Step by step cues ?Physical Assist : Mobility (physical);ADLs (physical) ?Mobility (physical): Bed mobility;Transfers ?ADLs (physical): Grooming;Feeding;Bathing;Dressing;Toileting ?  ?ADLs Comments: pt self-feeds wiht SETUP and washes face/hands. Caregiver performs dressing/bathing ?  ? ? ?Hand Dominance  ? Dominant Hand: Right ? ?  ?Extremity/Trunk Assessment  ? Upper Extremity Assessment ?Upper Extremity Assessment: Generalized weakness ?  ? ?Lower Extremity Assessment ?Lower Extremity Assessment: Generalized weakness ?  ? ?Cervical / Trunk Assessment ?Cervical / Trunk Assessment: Kyphotic  ?Communication  ? Communication: HOH  ?Cognition Arousal/Alertness: Awake/alert (periods of sleepiness) ?Behavior During Therapy: Flat affect ?Overall Cognitive Status: History of cognitive impairments - at baseline ?  ?  ?  ?  ?  ?  ?  ?  ?  ?  ?  ?  ?  ?  ?  ?  ?General Comments: pt oriented to self, stated she did not know where she was ?  ?  ? ?  ?General Comments   ? ?  ?Exercises    ? ?Assessment/Plan  ?  ?PT Assessment Patient needs continued PT services  ?PT Problem List Decreased strength;Decreased mobility;Decreased balance;Decreased activity tolerance ? ?   ?  ?PT Treatment Interventions DME instruction;Therapeutic exercise;Gait training;Balance training;Stair training;Neuromuscular re-education;Functional mobility training;Therapeutic activities;Patient/family education   ? ?PT Goals (Current goals can be found in the Care Plan section)  ?Acute Rehab PT Goals ?Patient Stated Goal: to regain her  strength as able ?PT Goal Formulation: With family ?Time For Goal Achievement: 01/27/22 ?Potential to Achieve Goals: Fair ? ?  ?Frequency Min 2X/week ?  ? ? ?Co-evaluation PT/OT/SLP Co-Evaluation/Treatment: Yes ?Reason for Co-Treatment: For patient/therapist safety;To address functional/ADL transfers;Necessary to address cognition/behavior during functional activity ?PT goals addressed during session: Mobility/safety with mobility;Balance ?OT goals addressed during session: ADL's and self-care ?  ? ? ?  ?AM-PAC PT "6 Clicks" Mobility  ?Outcome Measure Help needed turning from your back to your side while in a flat bed without using bedrails?: A Lot ?Help needed moving from lying on your back to sitting on the side of a flat bed without using bedrails?: A Lot ?Help needed moving to and from a bed to a chair (including a wheelchair)?: Total ?Help needed standing up from a chair using your arms (e.g., wheelchair or bedside chair)?: Total ?Help needed to walk in hospital room?: Total ?Help needed climbing 3-5 steps with a railing? : Total ?6 Click Score: 8 ? ?  ?End of Session Equipment Utilized During Treatment: Gait belt ?Activity Tolerance: Patient tolerated treatment well ?Patient left: in chair;with chair alarm set;with call bell/phone within reach;with family/visitor present ?Nurse Communication: Mobility status ?PT Visit Diagnosis: Other abnormalities of gait and mobility (R26.89) ?  ? ?Time: 2458-0998 ?PT Time Calculation (min) (ACUTE ONLY): 20 min ? ? ?Charges:   PT Evaluation ?$PT Eval Moderate Complexity: 1 Mod ?  ?  ?   ? ? ?Lieutenant Diego PT, DPT ?9:48 AM,01/13/22 ? ? ?

## 2022-01-13 NOTE — Progress Notes (Signed)
?PROGRESS NOTE ? ? ? ?Dawn Bradford  BMW:[id number] DOB: October 02, 1935 DOA: 01/11/2022 ?PCP: Pcp, No  ? ?Brief Narrative:  ?86 year old with history of ESRD on HD MWF, diastolic CHF, COPD, CAD, DM 2, HTN, HLD, hypothyroidism admitted for weakness and altered mental status worsening over the past 8 days.  Recently had her AV fistula decannulated with some drop in hemoglobin.  She also saw outpatient neurology where she was diagnosed with possible UTI and was prescribed Cipro but had not taken it.  Upon admission her hemoglobin was 6.7, EKG showed A-fib.  CT head was negative for acute pathology but showed chronic sinusitis, CT abdomen pelvis showed cardiomegaly with bilateral pleural effusion, 2.2 cm cystic pancreatic lesion.  Also started on IV Rocephin.  ? ? ?Assessment & Plan: ? Principal Problem: ?  Metabolic encephalopathy ?Active Problems: ?  Acute lower UTI ?  End-stage renal disease on hemodialysis ([place]) ?  Iron deficiency anemia due to chronic blood loss ?  Essential hypertension ?  Acquired hypothyroidism ?  Anxiety and depression ?  Type 2 diabetes mellitus with chronic kidney disease, with long-term current use of insulin ([place]) ?  ? ? ?Assessment and Plan: ?* Metabolic encephalopathy ?Urinary tract infection, secondary to E. coli ?- Urine cultures from outpatient urology office grew E. coli which is sensitive to IV Rocephin therefore we will continue this. ?-CT of the head is negative for acute pathology.  No focal neurodeficits.  Closely continue to monitor this. ? ?Iron deficiency anemia due to chronic blood loss and macrocytosis ?-Had recent decannulation of her AV fistula.  Hemoccult is negative.  Status post 1 unit PRBC 5/10, hemoglobin 9.0 ? ?End-stage renal disease on hemodialysis ([place]) ?-Nephrology team notified.  Status post HD 5/10.  EPO increased ? ?History of CAD status post bypass ?History of CVA ?-On aspirin and Crestor ?-Continue Coreg, Avapro, isosorbide ? ?Type 2 diabetes mellitus with chronic  kidney disease, with long-term current use of insulin ([place]) ?-Sliding scale and Accu-Cheks. A1c = 5.7 ? ?Anxiety and depression ?-Currently on Prozac and as needed Klonopin ? ?Acquired hypothyroidism ?-Synthroid ? ?Essential hypertension ?-Currently on Coreg, Avapro, isosorbide.  Also gets midodrine prior to HD? ? ?Sacral decubitus ulcer stage II-present POA ? ?PT/OT ordered ? ? ?DVT prophylaxis: DC subcu heparin.  Place SCDs ?Code Status: DNR ?Family Communication: Daughter present at bedside along with the caregiver ? ?Status is: Inpatient ?Remains inpatient appropriate because: Maintain hospital stay for IV antibiotics and to make sure her mentation continues to improve. ? ?Subjective: ?Still feels very weak but able to sit in the chair.  Daughter is at bedside who tells me she feels better and looks much better than yesterday. ?When physical therapy try to get her up earlier she required quite a bit of assistance. ? ?Examination: ?Constitutional: Appears chronically ill-appearing. ?Respiratory: Clear to auscultation bilaterally ?Cardiovascular: Normal sinus rhythm, no rubs ?Abdomen: Nontender nondistended good bowel sounds ?Musculoskeletal: No edema noted ?Skin: No rashes seen ?Neurologic: CN 2-12 grossly intact.  And nonfocal ?Psychiatric: Poor judgment and insight ?Objective: ?Vitals:  ? 01/12/22 2008 01/12/22 2320 01/13/22 0309 01/13/22 0439  ?BP: 120/69 118/80  (!) 110/49  ?Pulse: 78 69  92  ?Resp:  19  20  ?Temp: 97.9 ?F (36.6 ?C) 97.8 ?F (36.6 ?C)  (!) 97.5 ?F (36.4 ?C)  ?TempSrc: Axillary Axillary  Oral  ?SpO2: 100% 100%  100%  ?Weight:   84 kg   ? ? ?Intake/Output Summary (Last 24 hours) at 01/13/2022 0744 ?Last data filed  at 01/13/2022 0200 ?Gross per 24 hour  ?Intake 100 ml  ?Output 1000 ml  ?Net -900 ml  ? ?Filed Weights  ? 01/12/22 1030 01/12/22 1449 01/13/22 0309  ?Weight: 80.6 kg 79.3 kg 84 kg  ? ? ? ?Data Reviewed:  ? ?CBC: ?Recent Labs  ?Lab 01/11/22 ?1705 01/12/22 ?4097 01/13/22 ?3532  ?WBC 7.1  7.6 9.3  ?HGB 6.7* 9.0* 9.0*  ?HCT 23.2* 28.7* 27.7*  ?MCV 116.0* 103.2* 100.0  ?PLT 122* 123* 122*  ? ?Basic Metabolic Panel: ?Recent Labs  ?Lab 01/11/22 ?1705 01/12/22 ?9924 01/13/22 ?2683  ?NA 144 142 136  ?K 3.8 4.0 4.2  ?CL 109 106 103  ?CO2 26 28 27   ?GLUCOSE 132* 140* 118*  ?BUN 25* 26* 17  ?CREATININE 3.45* 3.78* 2.70*  ?CALCIUM 8.2* 8.2* 8.3*  ?MG  --   --  1.7  ? ?GFR: ?Estimated Creatinine Clearance: 16.6 mL/min (A) (by C-G formula based on SCr of 2.7 mg/dL (H)). ?Liver Function Tests: ?Recent Labs  ?Lab 01/11/22 ?1705  ?AST 13*  ?ALT 10  ?ALKPHOS 62  ?BILITOT 0.5  ?PROT 5.7*  ?ALBUMIN 2.8*  ? ?No results for input(s): LIPASE, AMYLASE in the last 168 hours. ?No results for input(s): AMMONIA in the last 168 hours. ?Coagulation Profile: ?No results for input(s): INR, PROTIME in the last 168 hours. ?Cardiac Enzymes: ?No results for input(s): CKTOTAL, CKMB, CKMBINDEX, TROPONINI in the last 168 hours. ?BNP (last 3 results) ?No results for input(s): PROBNP in the last 8760 hours. ?HbA1C: ?Recent Labs  ?  01/12/22 ?0913  ?HGBA1C 5.7*  ? ?CBG: ?Recent Labs  ?Lab 01/12/22 ?1827 01/12/22 ?2113  ?GLUCAP 116* 135*  ? ?Lipid Profile: ?No results for input(s): CHOL, HDL, LDLCALC, TRIG, CHOLHDL, LDLDIRECT in the last 72 hours. ?Thyroid Function Tests: ?No results for input(s): TSH, T4TOTAL, FREET4, T3FREE, THYROIDAB in the last 72 hours. ?Anemia Panel: ?No results for input(s): VITAMINB12, FOLATE, FERRITIN, TIBC, IRON, RETICCTPCT in the last 72 hours. ?Sepsis Labs: ?Recent Labs  ?Lab 01/11/22 ?2209 01/12/22 ?0913  ?LATICACIDVEN 0.9 1.2  ? ? ?Recent Results (from the past 240 hour(s))  ?Microscopic Examination     Status: Abnormal  ? Collection Time: 01/06/22  2:17 PM  ? Urine  ?Result Value Ref Range Status  ? WBC, UA 11-30 (A) 0 - 5 /hpf Final  ? RBC 0-2 0 - 2 /hpf Final  ? Epithelial Cells (non renal) 0-10 0 - 10 /hpf Final  ? Bacteria, UA Moderate (A) None seen/Few Final  ?CULTURE, URINE COMPREHENSIVE     Status:  Abnormal  ? Collection Time: 01/06/22  2:57 PM  ? Specimen: Urine  ? UR  ?Result Value Ref Range Status  ? Urine Culture, Comprehensive Final report (A)  Final  ? Organism ID, Bacteria Escherichia coli (A)  Final  ?  Comment: Cefazolin <=4 ug/mL ?Cefazolin with an MIC <=16 predicts susceptibility to the oral agents ?cefaclor, cefdinir, cefpodoxime, cefprozil, cefuroxime, cephalexin, ?and loracarbef when used for therapy of uncomplicated urinary tract ?infections due to E. coli, Klebsiella pneumoniae, and Proteus ?mirabilis. ?Greater than 100,000 colony forming units per mL ?  ? ANTIMICROBIAL SUSCEPTIBILITY Comment  Final  ?  Comment:       ** S = Susceptible; I = Intermediate; R = Resistant ** ?                   P = Positive; N = Negative ?            MICS  are expressed in micrograms per mL ?   Antibiotic                 RSLT#1    RSLT#2    RSLT#3    RSLT#4 ?Amoxicillin/Clavulanic Acid    S ?Ampicillin                     R ?Cefepime                       S ?Ceftriaxone                    S ?Cefuroxime                     S ?Ciprofloxacin                  S ?Ertapenem                      S ?Gentamicin                     S ?Imipenem                       S ?Levofloxacin                   S ?Meropenem                      S ?Nitrofurantoin                 S ?Piperacillin/Tazobactam        S ?Tetracycline                   R ?Tobramycin                     S ?Trimethoprim/Sulfa             R ?  ?MRSA Next Gen by PCR, Nasal     Status: None  ? Collection Time: 01/12/22  6:23 PM  ? Specimen: Nasal Mucosa; Nasal Swab  ?Result Value Ref Range Status  ? MRSA by PCR Next Gen NOT DETECTED NOT DETECTED Final  ?  Comment: (NOTE) ?The GeneXpert MRSA Assay (FDA approved for NASAL specimens only), ?is one component of a comprehensive MRSA colonization surveillance ?program. It is not intended to diagnose MRSA infection nor to guide ?or monitor treatment for MRSA infections. ?Test performance is not FDA approved in patients less  than 2 years ?old. ?Performed at [facility], [place], ?[place] [address] ?  ?  ? ? ? ? ? ?Radiology Studies: ?CT HEAD WO CONTRAST (5MM) ? ?Result Date: 01/11/2022 ?CLINICAL DATA:

## 2022-01-13 NOTE — TOC Initial Note (Signed)
Transition of Care [facility]) - Initial/Assessment Note    Patient Details  Name: Dawn Bradford MRN: [id number] Date of Birth: Sep 08, 1935  Transition of Care [facility]) CM/SW Contact:    Maree Krabbe, LCSW Phone Number: 01/13/2022, 10:19 AM  Clinical Narrative:   CSW spoke with pt's daughter Dianna. Pt's daughter states pt has a 24 hour private caregiver at home. Pt's daughter states pt was active with [facility] prior to admission however daughter is not impressed with RN care. Pt's daughter would like to pursue a alternative HH agency to see if the RN care is better. Pt's daughter is agreeable to [facility]. CSW notified Elnita Maxwell with [facility] that pt's daughter would like for her to dc from services through [facility]. Notified Barbara Cower with [facility] that she would like their services. Barbara Cower can accept. Pt's daughter also wants a hoyar lift. Lift ordered through [facility].                 Expected Discharge Plan: Home w Home Health Services Barriers to Discharge: Continued Medical Work up   Patient Goals and CMS Choice Patient states their goals for this hospitalization and ongoing recovery are:: for pt to get better   Choice offered to / list presented to : Adult Children  Expected Discharge Plan and Services Expected Discharge Plan: Home w Home Health Services In-house Referral: NA   Post Acute Care Choice: Home Health Living arrangements for the past 2 months: Single Family Home                 DME Arranged:  (hoyar lift) DME Agency: [facility] Date DME Agency Contacted: 01/13/22 Time DME Agency Contacted: 1017 Representative spoke with at DME Agency: Bjorn Loser HH Arranged: RN, PT, OT [facility] Agency: [facility]) Date HH Agency Contacted: 01/13/22 Time HH Agency Contacted: 1019 Representative spoke with at [facility] Agency: Barbara Cower  Prior Living Arrangements/Services Living arrangements for the past 2 months: Single Family Home Lives with:: Self Patient language and need for  interpreter reviewed:: Yes Do you feel safe going back to the place where you live?: Yes      Need for Family Participation in Patient Care: Yes (Comment) Care giver support system in place?: Yes (comment) Current home services:  (pt has 24 hour caregiver) Criminal Activity/Legal Involvement Pertinent to Current Situation/Hospitalization: No - Comment as needed  Activities of Daily Living Home Assistive Devices/Equipment: None ADL Screening (condition at time of admission) Patient's cognitive ability adequate to safely complete daily activities?: No Is the patient deaf or have difficulty hearing?: No Does the patient have difficulty seeing, even when wearing glasses/contacts?: No Does the patient have difficulty concentrating, remembering, or making decisions?: No Patient able to express need for assistance with ADLs?: Yes Does the patient have difficulty dressing or bathing?: Yes Independently performs ADLs?: No Communication: Needs assistance Is this a change from baseline?: Pre-admission baseline Dressing (OT): Dependent Is this a change from baseline?: Pre-admission baseline Grooming: Dependent Is this a change from baseline?: Pre-admission baseline Feeding: Dependent Is this a change from baseline?: Pre-admission baseline Bathing: Dependent Is this a change from baseline?: Pre-admission baseline Toileting: Dependent Is this a change from baseline?: Pre-admission baseline In/Out Bed: Dependent Is this a change from baseline?: Pre-admission baseline Walks in Home: Dependent Is this a change from baseline?: Pre-admission baseline Does the patient have difficulty walking or climbing stairs?: Yes Weakness of Legs: Both Weakness of Arms/Hands: Both  Permission Sought/Granted Permission sought to share information with : Family Supports Permission granted  to share information with : Yes, Release of Information Signed  Share Information with NAME: Deanna  Permission granted to  share info w AGENCY[facility]  Permission granted to share info w Relationship: daughter     Emotional Assessment Appearance:: Appears stated age Attitude/Demeanor/Rapport: Unable to Assess Affect (typically observed): Unable to Assess Orientation: : Oriented to Place, Oriented to Self Alcohol / Substance Use: Not Applicable Psych Involvement: No (comment)  Admission diagnosis:  Metabolic encephalopathy [G93.41] Weakness [R53.1] Anemia, unspecified type [D64.9] Patient Active Problem List   Diagnosis Date Noted   Metabolic encephalopathy 01/12/2022   Acute lower UTI 01/12/2022   Anxiety and depression 01/12/2022   Type 2 diabetes mellitus with chronic kidney disease, with long-term current use of insulin (HCC) 01/12/2022   End-stage renal disease on hemodialysis (HCC) 01/12/2022   Iron deficiency anemia due to chronic blood loss 01/12/2022   Acute on chronic diastolic CHF (congestive heart failure) (HCC) 09/21/2021   Acute pulmonary edema (HCC) 09/21/2021   Acute on chronic respiratory failure with hypoxia (HCC) 09/21/2021   COPD exacerbation (HCC) 09/21/2021   Elevated troponin 09/21/2021   Sacral decubitus ulcer, stage II (HCC) 09/21/2021   Pain due to onychomycosis of toenails of both feet 11/28/2019   Pain, generalized 09/12/2018   Palliative care encounter 09/12/2018   Anemia due to end stage renal disease (HCC) 09/05/2018   Cellulitis of right hip 09/01/2018   Hypertensive heart and renal disease with congestive heart failure and end stage renal disease (HCC) 08/23/2018   COPD with asthma (HCC) 08/23/2018   Acquired hypothyroidism 08/23/2018   Dyslipidemia associated with type 2 diabetes mellitus (HCC) 08/23/2018   Chronic kidney disease with end stage renal disease on dialysis due to type 2 diabetes mellitus (HCC) 08/23/2018   Diabetes mellitus type 2 with retinopathy (HCC) 08/23/2018   Controlled type 2 diabetes with renal manifestation 08/23/2018   Chronic  cerebrovascular accident (CVA) 08/23/2018   Chronic constipation 08/23/2018   Protein-calorie malnutrition, severe (HCC) 08/23/2018   Closed comminuted intertrochanteric fracture of proximal end of right femur (HCC) 08/23/2018   Closed fracture of right hip with routine healing 08/22/2018   Closed intertrochanteric fracture of hip, right, initial encounter (HCC) 08/11/2018   Hypoxia 10/12/2017   Hx of CABG 10/18/2016   Nonrheumatic aortic valve stenosis 10/18/2016   ESRD on dialysis (HCC) 07/07/2016   Background diabetic retinopathy (HCC) 01/04/2016   Chest pain 08/19/2015   Coronary artery disease involving native heart with angina pectoris (HCC) 08/18/2015   BMI 38.0-38.9,adult 07/07/2015   Cognitive deficit, post-stroke 07/07/2015   H/O ischemic left MCA stroke 02/19/2015   Expressive aphasia 02/03/2015   Carotid stenosis 02/03/2015   Diabetes mellitus type 2 in obese (HCC) 01/31/2015   End stage renal disease (HCC) 01/31/2015   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 03/08/2011   Diabetic glomerulopathy (HCC) 03/08/2011   Edema 03/08/2011   Obstructive chronic bronchitis without exacerbation (HCC) 03/08/2011   Recurrent major depressive disorder, in partial remission (HCC) 03/08/2011   Unspecified cataract 03/08/2011   Solitary pulmonary nodule 03/08/2011   Mild intermittent asthma 03/29/2010   CAD, ARTERY BYPASS GRAFT 03/02/2010   Obesity hypoventilation syndrome (HCC) 02/05/2010   Chronic depression 01/15/2010   Shortness of breath 12/24/2009   CAROTID BRUIT 12/14/2009   DIASTOLIC HEART FAILURE, CHRONIC 02/17/2009   DIABETES MELLITUS, TYPE II 01/29/2009   Hyperlipidemia 01/29/2009   Obstructive sleep apnea 01/29/2009   Essential hypertension 01/29/2009   Aortic valve disorder 01/29/2009  RENAL ARTERY STENOSIS 01/29/2009   RENAL DISEASE, CHRONIC 01/29/2009   PCP:  Oneita Hurt, No Pharmacy:   [facility] [address] ([address]), [place] - [address] [place] ([place]) [place] [address] Phone: [phone] Fax: [phone]  [facility] - [address] [place], [place] - [address] [place] [address] Phone: [phone] Fax: [phone]  [facility] - [place], [place] - [address] [place] [address] [place] [address] [place] [address] Phone: [phone] Fax: [phone]     Social Determinants of Health (SDOH) Interventions    Readmission Risk Interventions     View : No data to display.

## 2022-01-14 ENCOUNTER — Inpatient Hospital Stay: Payer: Medicare Other

## 2022-01-14 DIAGNOSIS — G9341 Metabolic encephalopathy: Secondary | ICD-10-CM | POA: Diagnosis not present

## 2022-01-14 LAB — CBC
HCT: 29.1 % — ABNORMAL LOW (ref 36.0–46.0)
Hemoglobin: 9 g/dL — ABNORMAL LOW (ref 12.0–15.0)
MCH: 32.3 pg (ref 26.0–34.0)
MCHC: 30.9 g/dL (ref 30.0–36.0)
MCV: 104.3 fL — ABNORMAL HIGH (ref 80.0–100.0)
Platelets: 124 10*3/uL — ABNORMAL LOW (ref 150–400)
RBC: 2.79 MIL/uL — ABNORMAL LOW (ref 3.87–5.11)
RDW: 18.3 % — ABNORMAL HIGH (ref 11.5–15.5)
WBC: 9 10*3/uL (ref 4.0–10.5)
nRBC: 0.2 % (ref 0.0–0.2)

## 2022-01-14 LAB — GLUCOSE, CAPILLARY
Glucose-Capillary: 125 mg/dL — ABNORMAL HIGH (ref 70–99)
Glucose-Capillary: 96 mg/dL (ref 70–99)
Glucose-Capillary: 96 mg/dL (ref 70–99)
Glucose-Capillary: 114 mg/dL — ABNORMAL HIGH (ref 70–99)

## 2022-01-14 LAB — BASIC METABOLIC PANEL
Anion gap: 8 (ref 5–15)
BUN: 23 mg/dL (ref 8–23)
CO2: 28 mmol/L (ref 22–32)
Calcium: 8.4 mg/dL — ABNORMAL LOW (ref 8.9–10.3)
Chloride: 102 mmol/L (ref 98–111)
Creatinine, Ser: 3.69 mg/dL — ABNORMAL HIGH (ref 0.44–1.00)
GFR, Estimated: 12 mL/min — ABNORMAL LOW (ref >60)
Glucose, Bld: 128 mg/dL — ABNORMAL HIGH (ref 70–99)
Potassium: 3.8 mmol/L (ref 3.5–5.1)
Sodium: 138 mmol/L (ref 135–145)

## 2022-01-14 LAB — MAGNESIUM: Magnesium: 2.1 mg/dL (ref 1.7–2.4)

## 2022-01-14 MED ORDER — SODIUM CHLORIDE 0.9 % IV BOLUS
250.0000 mL | Freq: Once | INTRAVENOUS | Status: AC
  Administered 2022-01-14: 250 mL via INTRAVENOUS

## 2022-01-14 MED ORDER — CEFTRIAXONE SODIUM 1 G IJ SOLR
1.0000 g | INTRAMUSCULAR | Status: DC
  Administered 2022-01-15 – 2022-01-17 (×2): 1 g via INTRAMUSCULAR
  Filled 2022-01-14 (×4): qty 10

## 2022-01-14 MED ORDER — EPOETIN ALFA 10000 UNIT/ML IJ SOLN
INTRAMUSCULAR | Status: AC
  Filled 2022-01-14: qty 1

## 2022-01-14 NOTE — Progress Notes (Signed)
Patient very lethargic after dialysis. BP 87/76. Notified MD.  ?

## 2022-01-14 NOTE — Progress Notes (Signed)
Hemodialysis Post Treatment Note ? ?14 Jan 2022 ? ?Access: ?LFA AVF ? ?Treatment Time: ?3 Hours ? ?UF Removed: ?0 ML ? ?Next Scheduled Treatment: ?TBD ? ?Note: ?Patient presents for hemodialysis treatment disoriented, responds to name, in no obvious pain. Cooperative with staff, allows for cannulation of AVF, which is without concern for infection, or stenosis. Patient does become anxious during treatment, consistently fumbling with needles, unable to be redirected. UF put on hold due to episodes of hypotension, and patient given bolus of NS. Dry weight to be reassessed. Patient completed treatment without further interventions, minimal post treatment bleeding. Patient transported to assigned room.  ?

## 2022-01-14 NOTE — Discharge Instructions (Signed)
Aspiration Risk ?  Mild aspiration risk;Moderate aspiration risk;Risk for inadequate nutrition/hydration  ?   ?Diet Recommendation   dysphagia level 2(MINCED foods moistened) w/ Nectar liquids via Cup; aspiration precautions; reduce Distractions during meals and engage pt during po's at meal for self-feeding. Support w/ feeding at meals as needed.  ? ?

## 2022-01-14 NOTE — Progress Notes (Signed)
Patient slightly lethargic post hemodialysis.  Seen and examined at bedside, daughter is present at bedside as well. ?Patient is following all commands but has some slurred speech.  On vital signs she is slightly hypotensive systolic blood pressure in low 90s. ?On physical exam there is no focal neurodeficits identified but there is generalized weakness. ? ?I had extensive discussion with the patient's daughter DNR at bedside.  She is in agreement that no aggressive measures-keep her DNR/DNI with continue basic management.  She is also agreeable to consult social worker for possible hospital stay to follow-up as outpatient. ? ?For now we will get CT of the head, give 250 cc normal saline slow bolus.  Continue neurochecks. ? ?RN at bedside. ? ?Gerlean Ren MD ?TRH ?

## 2022-01-14 NOTE — Progress Notes (Signed)
?PROGRESS NOTE ? ? ? ?Dawn Bradford  NTI:[id number] DOB: February 16, 1936 DOA: 01/11/2022 ?PCP: Pcp, No  ? ?Brief Narrative:  ?86 year old with history of ESRD on HD MWF, diastolic CHF, COPD, CAD, DM 2, HTN, HLD, hypothyroidism admitted for weakness and altered mental status worsening over the past 8 days.  Recently had her AV fistula decannulated with some drop in hemoglobin.  She also saw outpatient neurology where she was diagnosed with possible UTI and was prescribed Cipro but had not taken it.  Upon admission her hemoglobin was 6.7, EKG showed A-fib.  CT head was negative for acute pathology but showed chronic sinusitis, CT abdomen pelvis showed cardiomegaly with bilateral pleural effusion, 2.2 cm cystic pancreatic lesion.  Also started on IV Rocephin.  ? ? ?Assessment & Plan: ? Principal Problem: ?  Metabolic encephalopathy ?Active Problems: ?  Acute lower UTI ?  End-stage renal disease on hemodialysis ([place]) ?  Iron deficiency anemia due to chronic blood loss ?  Essential hypertension ?  Acquired hypothyroidism ?  Anxiety and depression ?  Type 2 diabetes mellitus with chronic kidney disease, with long-term current use of insulin ([place]) ?  ? ? ?Assessment and Plan: ?* Metabolic encephalopathy ?Urinary tract infection, secondary to E. coli ?- Outptn Ucx grew Ecoli. Cont IV Abx. She is very lethargic today after HD ?-CT of the head is negative for acute pathology.  No focal neurodeficits.  Closely continue to monitor this. ? ?Iron deficiency anemia due to chronic blood loss and macrocytosis ?-Had recent decannulation of her AV fistula.  Hemoccult is negative.  Status post 1 unit PRBC 5/10, hemoglobin 9.0 ? ?End-stage renal disease on hemodialysis ([place]) ?-Nephrology team notified.  Status post HD 5/10.  EPO increased ? ?History of CAD status post bypass ?History of CVA ?-On aspirin and Crestor ?-Continue Coreg, Avapro, isosorbide ? ?Type 2 diabetes mellitus with chronic kidney disease, with long-term current use of insulin  ([place]) ?-Sliding scale and Accu-Cheks. A1c = 5.7 ? ?Anxiety and depression ?-Currently on Prozac and as needed Klonopin ? ?Acquired hypothyroidism ?-Synthroid ? ?Essential hypertension ?-Currently on Coreg, Avapro, isosorbide.  Also gets midodrine prior to HD? ? ?Sacral decubitus ulcer stage II-present POA ? ?PT/OT ordered ? ? ?DVT prophylaxis: DC subcu heparin.  Place SCDs ?Code Status: DNR ?Family Communication: Spoke with [name] and Deanna ? ?Status is: Inpatient ?Patient is very lethargic today after HD, unsafe DC.  ? ?Subjective: ?Lethargic during HD but arousable. No new complaints.  ? ?Examination: ?Constitutional: lethargic arousable.  ?Respiratory: Clear to auscultation bilaterally ?Cardiovascular: Normal sinus rhythm, no rubs ?Abdomen: Nontender nondistended good bowel sounds ?Musculoskeletal: No edema noted ?Skin: No rashes seen ?Neurologic: Alert to name, no focal neuro deficits.  ?Psychiatric: Poor judgment and insight ?Objective: ?Vitals:  ? 01/14/22 1200 01/14/22 1215 01/14/22 1250 01/14/22 1302  ?BP: 99/72 117/68  94/74  ?Pulse: 96 93 96 88  ?Resp: 16 19 18 16   ?Temp:  (!) 97.1 ?F (36.2 ?C)  (!) 97.4 ?F (36.3 ?C)  ?TempSrc:  Axillary  Axillary  ?SpO2: 100% 99% 100% 96%  ?Weight:  76.4 kg    ? ? ?Intake/Output Summary (Last 24 hours) at 01/14/2022 1405 ?Last data filed at 01/14/2022 1215 ?Gross per 24 hour  ?Intake 100.83 ml  ?Output 93 ml  ?Net 7.83 ml  ? ?Filed Weights  ? 01/14/22 0402 01/14/22 0859 01/14/22 1215  ?Weight: 78.4 kg 78.4 kg 76.4 kg  ? ? ? ?Data Reviewed:  ? ?CBC: ?Recent Labs  ?Lab 01/11/22 ?1705 01/12/22 ?  2505 01/13/22 ?3976 01/14/22 ?7341  ?WBC 7.1 7.6 9.3 9.0  ?HGB 6.7* 9.0* 9.0* 9.0*  ?HCT 23.2* 28.7* 27.7* 29.1*  ?MCV 116.0* 103.2* 100.0 104.3*  ?PLT 122* 123* 122* 124*  ? ?Basic Metabolic Panel: ?Recent Labs  ?Lab 01/11/22 ?1705 01/12/22 ?9379 01/13/22 ?0240 01/14/22 ?9735  ?NA 144 142 136 138  ?K 3.8 4.0 4.2 3.8  ?CL 109 106 103 102  ?CO2 26 28 27 28   ?GLUCOSE 132* 140* 118* 128*   ?BUN 25* 26* 17 23  ?CREATININE 3.45* 3.78* 2.70* 3.69*  ?CALCIUM 8.2* 8.2* 8.3* 8.4*  ?MG  --   --  1.7 2.1  ? ?GFR: ?Estimated Creatinine Clearance: 11.6 mL/min (A) (by C-G formula based on SCr of 3.69 mg/dL (H)). ?Liver Function Tests: ?Recent Labs  ?Lab 01/11/22 ?1705  ?AST 13*  ?ALT 10  ?ALKPHOS 62  ?BILITOT 0.5  ?PROT 5.7*  ?ALBUMIN 2.8*  ? ?No results for input(s): LIPASE, AMYLASE in the last 168 hours. ?No results for input(s): AMMONIA in the last 168 hours. ?Coagulation Profile: ?No results for input(s): INR, PROTIME in the last 168 hours. ?Cardiac Enzymes: ?No results for input(s): CKTOTAL, CKMB, CKMBINDEX, TROPONINI in the last 168 hours. ?BNP (last 3 results) ?No results for input(s): PROBNP in the last 8760 hours. ?HbA1C: ?Recent Labs  ?  01/12/22 ?0913  ?HGBA1C 5.7*  ? ?CBG: ?Recent Labs  ?Lab 01/13/22 ?1201 01/13/22 ?1626 01/13/22 ?2104 01/14/22 ?3299 01/14/22 ?1349  ?GLUCAP 140* 117* 135* 125* 96  ? ?Lipid Profile: ?No results for input(s): CHOL, HDL, LDLCALC, TRIG, CHOLHDL, LDLDIRECT in the last 72 hours. ?Thyroid Function Tests: ?No results for input(s): TSH, T4TOTAL, FREET4, T3FREE, THYROIDAB in the last 72 hours. ?Anemia Panel: ?No results for input(s): VITAMINB12, FOLATE, FERRITIN, TIBC, IRON, RETICCTPCT in the last 72 hours. ?Sepsis Labs: ?Recent Labs  ?Lab 01/11/22 ?2209 01/12/22 ?0913  ?LATICACIDVEN 0.9 1.2  ? ? ?Recent Results (from the past 240 hour(s))  ?Microscopic Examination     Status: Abnormal  ? Collection Time: 01/06/22  2:17 PM  ? Urine  ?Result Value Ref Range Status  ? WBC, UA 11-30 (A) 0 - 5 /hpf Final  ? RBC 0-2 0 - 2 /hpf Final  ? Epithelial Cells (non renal) 0-10 0 - 10 /hpf Final  ? Bacteria, UA Moderate (A) None seen/Few Final  ?CULTURE, URINE COMPREHENSIVE     Status: Abnormal  ? Collection Time: 01/06/22  2:57 PM  ? Specimen: Urine  ? UR  ?Result Value Ref Range Status  ? Urine Culture, Comprehensive Final report (A)  Final  ? Organism ID, Bacteria Escherichia coli (A)   Final  ?  Comment: Cefazolin <=4 ug/mL ?Cefazolin with an MIC <=16 predicts susceptibility to the oral agents ?cefaclor, cefdinir, cefpodoxime, cefprozil, cefuroxime, cephalexin, ?and loracarbef when used for therapy of uncomplicated urinary tract ?infections due to E. coli, Klebsiella pneumoniae, and Proteus ?mirabilis. ?Greater than 100,000 colony forming units per mL ?  ? ANTIMICROBIAL SUSCEPTIBILITY Comment  Final  ?  Comment:       ** S = Susceptible; I = Intermediate; R = Resistant ** ?                   P = Positive; N = Negative ?            MICS are expressed in micrograms per mL ?   Antibiotic                 RSLT#1  RSLT#2    RSLT#3    RSLT#4 ?Amoxicillin/Clavulanic Acid    S ?Ampicillin                     R ?Cefepime                       S ?Ceftriaxone                    S ?Cefuroxime                     S ?Ciprofloxacin                  S ?Ertapenem                      S ?Gentamicin                     S ?Imipenem                       S ?Levofloxacin                   S ?Meropenem                      S ?Nitrofurantoin                 S ?Piperacillin/Tazobactam        S ?Tetracycline                   R ?Tobramycin                     S ?Trimethoprim/Sulfa             R ?  ?MRSA Next Gen by PCR, Nasal     Status: None  ? Collection Time: 01/12/22  6:23 PM  ? Specimen: Nasal Mucosa; Nasal Swab  ?Result Value Ref Range Status  ? MRSA by PCR Next Gen NOT DETECTED NOT DETECTED Final  ?  Comment: (NOTE) ?The GeneXpert MRSA Assay (FDA approved for NASAL specimens only), ?is one component of a comprehensive MRSA colonization surveillance ?program. It is not intended to diagnose MRSA infection nor to guide ?or monitor treatment for MRSA infections. ?Test performance is not FDA approved in patients less than 2 years ?old. ?Performed at [facility], [place], ?[place] [address] ?  ?  ? ? ? ? ? ?Radiology Studies: ?No results found. ? ? ? ? ? ?Scheduled Meds: ? aspirin EC  81 mg  Oral Daily  ? carvedilol  25 mg Oral Q supper  ? carvedilol  25 mg Oral Once per day on Sun Tue Thu Sat  ? Chlorhexidine Gluconate Cloth  6 each Topical Q0600  ? epoetin (EPOGEN/PROCRIT) injection  10,0

## 2022-01-14 NOTE — Progress Notes (Signed)
Dawn Bradford ? ?MRN: [id number] ? ?DOB/AGE: [birth year] 86 y.o. ? ?Primary Care Physician:Pcp, No ? ?Admit date: 01/11/2022 ? ?Chief Complaint:  ?Chief Complaint  ?Patient presents with  ? Altered Mental Status  ? ? ?S-Pt presented on  01/11/2022 with  ?Chief Complaint  ?Patient presents with  ? Altered Mental Status  ?[name] ?Patient is a 86 year old Caucasian female with a past medical history of ESRD, on hemodialysis, on Monday Wednesday Friday schedule, chronic diastolic CHF, COPD, cardiac disease, diabetes mellitus type 2, hypertension, dyslipidemia, hypothyroidism who was brought to the hospital with chief complaint of altered mental status. ? ? ? ?Patient was admitted for further care ?Nephrology was consulted for comanagement of dialysis patient ? ?Patient is known to our clinic and receives outpatient dialysis at [facility] on a MWF schedule supervised by Dr. Candiss Norse.  ? ?Patient was seen today on second floor ?Patient offers no new specific physical complaints ? ? ?Medication  ? ? ? aspirin EC  81 mg Oral Daily  ? carvedilol  25 mg Oral Q supper  ? carvedilol  25 mg Oral Once per day on Sun Tue Thu Sat  ? Chlorhexidine Gluconate Cloth  6 each Topical Q0600  ? epoetin (EPOGEN/PROCRIT) injection  10,000 Units Intravenous Q M,W,F-HD  ? FLUoxetine  40 mg Oral Daily  ? heparin injection (subcutaneous)  5,000 Units Subcutaneous Q8H  ? hyoscyamine  0.125 mg Oral Q4H  ? insulin aspart  0-15 Units Subcutaneous TID AC & HS  ? irbesartan  75 mg Oral Daily  ? isosorbide dinitrate  5 mg Oral QHS  ? levothyroxine  75 mcg Oral Q0600  ? mouth rinse  15 mL Mouth Rinse BID  ? midodrine  5 mg Oral Q M,W,F-HD  ? pantoprazole (PROTONIX) IV  40 mg Intravenous Q12H  ? polyethylene glycol  17 g Oral Daily  ? rosuvastatin  40 mg Oral Daily  ? ? ? ? ? ? ? ?ROS: Unable to get much data ? ?Physical Exam: ?Vital signs in last 24 hours: ?Temp:  [97.1 ?F (36.2 ?C)-98.3 ?F (36.8 ?C)] 97.7 ?F (36.5 ?C) (05/12 1554) ?Pulse Rate:  [72-115]  94 (05/12 1554) ?Resp:  [16-26] 18 (05/12 1554) ?BP: (73-130)/(45-85) 87/76 (05/12 1554) ?SpO2:  [96 %-100 %] 100 % (05/12 1554) ?Weight:  [76.4 kg-78.4 kg] 76.4 kg (05/12 1215) ?Weight change: -2.2 kg ?  ? ?Intake/Output from previous day: ?05/11 0701 - 05/12 0700 ?In: 100.8 [I.V.:0.1; IV Piggyback:100.7] ?Out: 100 [Urine:100] ?Total I/O ?In: -  ?Out: -7  ? ? ?Physical Exam: ? ?General- pt is confused, unable to follow commands, chronically ill-appearing ? ?HEENT-head is atraumatic normocephalic ? ?Resp- No acute REsp distress, rhonchi present ? ?CVS- S1S2 regular in rate and rhythm ? ?GIT- BS+, soft, Non tender , Non distended ? ?EXT- No LE Edema,  No Cyanosis ? ?Access- LUE AVF ? ?Psych patient has poor judgment and insight ? ?Lab Results: ? ?CBC ? ?Recent Labs  ?  01/13/22 ?9485 01/14/22 ?4627  ?WBC 9.3 9.0  ?HGB 9.0* 9.0*  ?HCT 27.7* 29.1*  ?PLT 122* 124*  ? ? ?BMET ? ?Recent Labs  ?  01/13/22 ?0350 01/14/22 ?0938  ?NA 136 138  ?K 4.2 3.8  ?CL 103 102  ?CO2 27 28  ?GLUCOSE 118* 128*  ?BUN 17 23  ?CREATININE 2.70* 3.69*  ?CALCIUM 8.3* 8.4*  ? ? ? ? ?Most recent Creatinine trend  ?Lab Results  ?Component Value Date  ? CREATININE 3.69 (H) 01/14/2022  ?  CREATININE 2.70 (H) 01/13/2022  ? CREATININE 3.78 (H) 01/12/2022  ?  ? ? ?MICRO ? ? ?Recent Results (from the past 240 hour(s))  ?Microscopic Examination     Status: Abnormal  ? Collection Time: 01/06/22  2:17 PM  ? Urine  ?Result Value Ref Range Status  ? WBC, UA 11-30 (A) 0 - 5 /hpf Final  ? RBC 0-2 0 - 2 /hpf Final  ? Epithelial Cells (non renal) 0-10 0 - 10 /hpf Final  ? Bacteria, UA Moderate (A) None seen/Few Final  ?CULTURE, URINE COMPREHENSIVE     Status: Abnormal  ? Collection Time: 01/06/22  2:57 PM  ? Specimen: Urine  ? UR  ?Result Value Ref Range Status  ? Urine Culture, Comprehensive Final report (A)  Final  ? Organism ID, Bacteria Escherichia coli (A)  Final  ?  Comment: Cefazolin <=4 ug/mL ?Cefazolin with an MIC <=16 predicts susceptibility to the oral  agents ?cefaclor, cefdinir, cefpodoxime, cefprozil, cefuroxime, cephalexin, ?and loracarbef when used for therapy of uncomplicated urinary tract ?infections due to E. coli, Klebsiella pneumoniae, and Proteus ?mirabilis. ?Greater than 100,000 colony forming units per mL ?  ? ANTIMICROBIAL SUSCEPTIBILITY Comment  Final  ?  Comment:       ** S = Susceptible; I = Intermediate; R = Resistant ** ?                   P = Positive; N = Negative ?            MICS are expressed in micrograms per mL ?   Antibiotic                 RSLT#1    RSLT#2    RSLT#3    RSLT#4 ?Amoxicillin/Clavulanic Acid    S ?Ampicillin                     R ?Cefepime                       S ?Ceftriaxone                    S ?Cefuroxime                     S ?Ciprofloxacin                  S ?Ertapenem                      S ?Gentamicin                     S ?Imipenem                       S ?Levofloxacin                   S ?Meropenem                      S ?Nitrofurantoin                 S ?Piperacillin/Tazobactam        S ?Tetracycline                   R ?Tobramycin                     S ?Trimethoprim/Sulfa  R ?  ?MRSA Next Gen by PCR, Nasal     Status: None  ? Collection Time: 01/12/22  6:23 PM  ? Specimen: Nasal Mucosa; Nasal Swab  ?Result Value Ref Range Status  ? MRSA by PCR Next Gen NOT DETECTED NOT DETECTED Final  ?  Comment: (NOTE) ?The GeneXpert MRSA Assay (FDA approved for NASAL specimens only), ?is one component of a comprehensive MRSA colonization surveillance ?program. It is not intended to diagnose MRSA infection nor to guide ?or monitor treatment for MRSA infections. ?Test performance is not FDA approved in patients less than 2 years ?old. ?Performed at [facility], [place], ?[place] [address] ?  ?  ? ? ? ? ? ?Impression: ? ?Patient is 86 year old Caucasian female with a past medical history of end-stage renal disease on hemodialysis-on Monday Wednesday Friday schedule, diastolic CHF, COPD,  coronary disease, diabetes mellitus, hypertension, hyperlipidemia, hypothyroidism who is being admitted with chief complaint of weakness and altered mental status ? ?-Metabolic encephalopathy/altered mental status ?UTI ?End-stage renal disease ?Hypertension ?Hypothyroidism ?Diabetes mellitus ? ?Patient is known to our clinic and receives outpatient dialysis at [facility] on a MWF schedule supervised by Dr. Candiss Norse.  ? ?1)Renal   ? ? ?End-stage renal disease ?Patient is on hemodialysis ?Patient is on Monday Wednesday Friday schedule ?We will dialyze patient today ? ? ?2)Hypotension ?Patient is on midodrine ? ?3)Anemia of chronic disease ? ? ?  Latest Ref Rng & Units 01/14/2022  ?  6:26 AM 01/13/2022  ?  5:46 AM 01/12/2022  ?  9:13 AM  ?CBC  ?WBC 4.0 - 10.5 K/uL 9.0   9.3   7.6    ?Hemoglobin 12.0 - 15.0 g/dL 9.0   9.0   9.0    ?Hematocrit 36.0 - 46.0 % 29.1   27.7   28.7    ?Platelets 150 - 400 K/uL 124   122   123    ?  ? ?Patient hemoglobin was low, now better  ?Patient did receive PRBC yesterday ? patient on Epogen-dose adjusted  ? 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 ? ? ?Lab Results  ?Component Value Date  ? PTH 59 08/13/2018  ? CALCIUM 8.4 (L) 01/14/2022  ? PHOS 4.7 (H) 09/24/2021  ? ? ? ? ?Phosphorus at goal. ? ? ?5) altered mental status ?Most likely thought to be secondary to infectious etiologies ?Primary team is following ? ? ?6) Electrolytes  ? ? ?  Latest Ref Rng & Units 01/14/2022  ?  6:26 AM 01/13/2022  ?  5:46 AM 01/12/2022  ?  9:13 AM  ?BMP  ?Glucose 70 - 99 mg/dL 128   118   140    ?BUN 8 - 23 mg/dL 23   17   26     ?Creatinine 0.44 - 1.00 mg/dL 3.69   2.70   3.78    ?Sodium 135 - 145 mmol/L 138   136   142    ?Potassium 3.5 - 5.1 mmol/L 3.8   4.2   4.0    ?Chloride 98 - 111 mmol/L 102   103   106    ?CO2 22 - 32 mmol/L 28   27   28     ?Calcium 8.9 - 10.3 mg/dL 8.4   8.3   8.2    ?   ? ?Sodium ?Normonatremic ? ? ?Potassium ?Normokalemic ? ? ? ?7)Acid base ? ? ?Co2 at goal ? ?8)UTI ?Patient is on antibiotics ?Primary  team is following ? ? ?Plan: ? ? ?We will dialyze patient today ? ? ? ? ?Trinitie Mcgirr s Theador Hawthorne ?01/14/2022, 5:18 PM  ?

## 2022-01-15 DIAGNOSIS — G9341 Metabolic encephalopathy: Secondary | ICD-10-CM | POA: Diagnosis not present

## 2022-01-15 LAB — BASIC METABOLIC PANEL
Anion gap: 5 (ref 5–15)
BUN: 15 mg/dL (ref 8–23)
CO2: 29 mmol/L (ref 22–32)
Calcium: 8.3 mg/dL — ABNORMAL LOW (ref 8.9–10.3)
Chloride: 104 mmol/L (ref 98–111)
Creatinine, Ser: 2.79 mg/dL — ABNORMAL HIGH (ref 0.44–1.00)
GFR, Estimated: 16 mL/min — ABNORMAL LOW (ref >60)
Glucose, Bld: 101 mg/dL — ABNORMAL HIGH (ref 70–99)
Potassium: 4 mmol/L (ref 3.5–5.1)
Sodium: 138 mmol/L (ref 135–145)

## 2022-01-15 LAB — CBC
HCT: 27.1 % — ABNORMAL LOW (ref 36.0–46.0)
Hemoglobin: 8.3 g/dL — ABNORMAL LOW (ref 12.0–15.0)
MCH: 32.4 pg (ref 26.0–34.0)
MCHC: 30.6 g/dL (ref 30.0–36.0)
MCV: 105.9 fL — ABNORMAL HIGH (ref 80.0–100.0)
Platelets: 137 10*3/uL — ABNORMAL LOW (ref 150–400)
RBC: 2.56 MIL/uL — ABNORMAL LOW (ref 3.87–5.11)
RDW: 18.2 % — ABNORMAL HIGH (ref 11.5–15.5)
WBC: 10.1 10*3/uL (ref 4.0–10.5)
nRBC: 0.5 % — ABNORMAL HIGH (ref 0.0–0.2)

## 2022-01-15 LAB — MAGNESIUM: Magnesium: 1.9 mg/dL (ref 1.7–2.4)

## 2022-01-15 LAB — FOLATE: Folate: 5 ng/mL — ABNORMAL LOW (ref >5.9)

## 2022-01-15 LAB — IRON AND TIBC
Iron: 34 ug/dL (ref 28–170)
Saturation Ratios: 18 % (ref 10.4–31.8)
TIBC: 190 ug/dL — ABNORMAL LOW (ref 250–450)
UIBC: 156 ug/dL

## 2022-01-15 LAB — GLUCOSE, CAPILLARY
Glucose-Capillary: 102 mg/dL — ABNORMAL HIGH (ref 70–99)
Glucose-Capillary: 118 mg/dL — ABNORMAL HIGH (ref 70–99)
Glucose-Capillary: 129 mg/dL — ABNORMAL HIGH (ref 70–99)
Glucose-Capillary: 100 mg/dL — ABNORMAL HIGH (ref 70–99)

## 2022-01-15 LAB — VITAMIN D 25 HYDROXY (VIT D DEFICIENCY, FRACTURES): Vit D, 25-Hydroxy: 40.51 ng/mL (ref 30–100)

## 2022-01-15 LAB — VITAMIN B12: Vitamin B-12: 487 pg/mL (ref 180–914)

## 2022-01-15 MED ORDER — FOLIC ACID 1 MG PO TABS
1.0000 mg | ORAL_TABLET | Freq: Every day | ORAL | Status: DC
  Administered 2022-01-15 – 2022-01-18 (×4): 1 mg via ORAL
  Filled 2022-01-15 (×5): qty 1

## 2022-01-15 MED ORDER — CARVEDILOL 6.25 MG PO TABS
3.1250 mg | ORAL_TABLET | Freq: Every day | ORAL | Status: DC
Start: 2022-01-15 — End: 2022-01-22
  Administered 2022-01-15 – 2022-01-19 (×5): 3.125 mg via ORAL
  Filled 2022-01-15 (×6): qty 1

## 2022-01-15 MED ORDER — CARVEDILOL 6.25 MG PO TABS
3.1250 mg | ORAL_TABLET | ORAL | Status: DC
Start: 2022-01-16 — End: 2022-01-22
  Administered 2022-01-16 – 2022-01-20 (×3): 3.125 mg via ORAL
  Filled 2022-01-15 (×3): qty 1

## 2022-01-15 MED ORDER — ROSUVASTATIN CALCIUM 10 MG PO TABS
10.0000 mg | ORAL_TABLET | Freq: Every day | ORAL | Status: DC
Start: 2022-01-16 — End: 2022-01-21
  Administered 2022-01-16 – 2022-01-18 (×3): 10 mg via ORAL
  Filled 2022-01-15 (×4): qty 1

## 2022-01-15 MED ORDER — PANTOPRAZOLE 2 MG/ML SUSPENSION
40.0000 mg | Freq: Two times a day (BID) | ORAL | Status: DC
  Administered 2022-01-16 – 2022-01-20 (×8): 40 mg via ORAL
  Filled 2022-01-15 (×13): qty 20

## 2022-01-15 MED ORDER — PANTOPRAZOLE 2 MG/ML SUSPENSION
40.0000 mg | Freq: Two times a day (BID) | ORAL | Status: DC
  Filled 2022-01-15: qty 20

## 2022-01-15 NOTE — Progress Notes (Signed)
[facility]) Hospital Liaison RN note ? ?Received request from [facility] for hospice services at home after discharge. Chart and patient information under review by Hospice physician. Hospice eligibility pending at this time.  ? ?Spoke with Deanna to initiate education related to hospice philosophy, services, and team approach to care. Patient/family verbalized understanding of information given. Per discussion, the plan is for discharge home by EMS tomorrow or Monday.  ?DME needs discussed. Patient has the following equipment in the home. Hospital bed, wheel chair, oxygen, hoyer lift. Patient/family requests the following equipment for deliver: None.  ? ?Please send signed and completed DNR with patient/family. Please provide symptoms at discharge as needed for ongoing symptom management.  ? ?[facility] information and contact numbers given to [place]. Above information shared with [facility].  ?Please call with any hospice related questions or concerns.  ?Thank you for the opportunity to participate in this patient's care.  ?Dawn Bradford, Therapist, sports, BSN ?[facility] Liaison ?[phone] ?

## 2022-01-15 NOTE — Progress Notes (Signed)
?PROGRESS NOTE ? ? ? ?Dawn Bradford  NKN:[id number] DOB: 1935/10/17 DOA: 01/11/2022 ?PCP: Pcp, No  ? ?Brief Narrative:  ?86 year old with history of ESRD on HD MWF, diastolic CHF, COPD, CAD, DM 2, HTN, HLD, hypothyroidism admitted for weakness and altered mental status worsening over the past 8 days.  Recently had her AV fistula decannulated with some drop in hemoglobin.  She also saw outpatient neurology where she was diagnosed with possible UTI and was prescribed Cipro but had not taken it.  Upon admission her hemoglobin was 6.7, EKG showed A-fib.  CT head was negative for acute pathology but showed chronic sinusitis, CT abdomen pelvis showed cardiomegaly with bilateral pleural effusion, 2.2 cm cystic pancreatic lesion.  Also started on IV Rocephin.  ? ? ?Assessment & Plan: ? Principal Problem: ?  Metabolic encephalopathy ?Active Problems: ?  Acute lower UTI ?  End-stage renal disease on hemodialysis ([place]) ?  Iron deficiency anemia due to chronic blood loss ?  Essential hypertension ?  Acquired hypothyroidism ?  Anxiety and depression ?  Type 2 diabetes mellitus with chronic kidney disease, with long-term current use of insulin ([place]) ?  ? ? ?Assessment and Plan: ?* Metabolic encephalopathy ?Urinary tract infection, secondary to E. coli ?- Outptn Ucx grew Ecoli. Cont IV Abx. She became lethargic after HD on 5/12 ?-CT of the head is negative for acute pathology.  No focal neurodeficits.   ?Closely continue to monitor this. ?Check ABGs to rule out CO2 retention, patient has OSA, very sleepy and snoring during sleep ? ?Iron deficiency anemia due to chronic blood loss and macrocytosis ?-Had recent decannulation of her AV fistula.  Hemoccult is negative.   ?Status post 1 unit PRBC 5/10, hemoglobin 9.0 ? ?End-stage renal disease on hemodialysis ([place]) ?-Nephrology team notified.  Status post HD 5/10.  EPO increased ? ?History of CAD status post bypass ?History of CVA ?-On aspirin and Crestor ?-Continue Coreg, Avapro,  isosorbide ? ?Type 2 diabetes mellitus with chronic kidney disease, with long-term current use of insulin ([place]) ?-Sliding scale and Accu-Cheks. A1c = 5.7 ? ?Anxiety and depression ?-Currently on Prozac and as needed Klonopin ? ?Acquired hypothyroidism ?-Synthroid ? ?Essential hypertension ?-Currently on Coreg, Avapro, isosorbide.  Also gets midodrine prior to HD? ?5/13, blood pressure was very soft dropped yesterday evening ?Decrease dose of Coreg with holding parameters, use holding parameters for Avapro and Imdur ? ? ?Sacral decubitus ulcer stage II-present POA ?Turn patient every 2 hourly and continue fall precautions ? ?PT/OT ordered ? ? ?DVT prophylaxis: DC subcu heparin.  Place SCDs ?Code Status: DNR ?Family Communication: Spoke with [name] and Deanna ? ?Status is: Inpatient ?Patient was very lethargic after HD on 5/12, unsafe DC.  Still patient is very lethargic and sleepy, blood pressure is very soft, we will continue to monitor closely and plan for disposition in 1 to 2 days  ? ?Subjective: ?No significant overnight events, patient became lethargic after dialysis tomorrow, in the morning time she woke up as per caregiver but during my interview patient was very sleepy and snoring, she was hard to wake up, baseline she is AO x1. ?We will continue to monitor closely. ? ?Examination: ?Constitutional: lethargic arousable.  ?Respiratory: Clear to auscultation bilaterally ?Cardiovascular: Normal sinus rhythm, no rubs ?Abdomen: Nontender nondistended good bowel sounds ?Musculoskeletal: No edema noted ?Skin: No rashes seen ?Neurologic: Alert to name, no focal neuro deficits.  ?Psychiatric: Sleepy and lethargic ?Objective: ?Vitals:  ? 01/14/22 1302 01/14/22 1554 01/14/22 1949 01/15/22 0442  ?BP: 94/74 [name])  87/76 92/80 91/79   ?Pulse: 88 94 90 89  ?Resp: 16 18 20 20   ?Temp: (!) 97.4 ?F (36.3 ?C) 97.7 ?F (36.5 ?C) (!) 97 ?F (36.1 ?C) 98.5 ?F (36.9 ?C)  ?TempSrc: Axillary Axillary Axillary Oral  ?SpO2: 96% 100% 97% 100%   ?Weight:   78.9 kg   ?Height:   5\' 5"  (1.651 m)   ? ? ?Intake/Output Summary (Last 24 hours) at 01/15/2022 1303 ?Last data filed at 01/15/2022 0100 ?Gross per 24 hour  ?Intake 51.61 ml  ?Output --  ?Net 51.61 ml  ? ?Filed Weights  ? 01/14/22 0859 01/14/22 1215 01/14/22 1949  ?Weight: 78.4 kg 76.4 kg 78.9 kg  ? ? ? ?Data Reviewed:  ? ?CBC: ?Recent Labs  ?Lab 01/11/22 ?1705 01/12/22 ?6286 01/13/22 ?3817 01/14/22 ?7116 01/15/22 ?5790  ?WBC 7.1 7.6 9.3 9.0 10.1  ?HGB 6.7* 9.0* 9.0* 9.0* 8.3*  ?HCT 23.2* 28.7* 27.7* 29.1* 27.1*  ?MCV 116.0* 103.2* 100.0 104.3* 105.9*  ?PLT 122* 123* 122* 124* 137*  ? ?Basic Metabolic Panel: ?Recent Labs  ?Lab 01/11/22 ?1705 01/12/22 ?3833 01/13/22 ?3832 01/14/22 ?9191 01/15/22 ?6606  ?NA 144 142 136 138 138  ?K 3.8 4.0 4.2 3.8 4.0  ?CL 109 106 103 102 104  ?CO2 26 28 27 28 29   ?GLUCOSE 132* 140* 118* 128* 101*  ?BUN 25* 26* 17 23 15   ?CREATININE 3.45* 3.78* 2.70* 3.69* 2.79*  ?CALCIUM 8.2* 8.2* 8.3* 8.4* 8.3*  ?MG  --   --  1.7 2.1 1.9  ? ?GFR: ?Estimated Creatinine Clearance: 15.6 mL/min (A) (by C-G formula based on SCr of 2.79 mg/dL (H)). ?Liver Function Tests: ?Recent Labs  ?Lab 01/11/22 ?1705  ?AST 13*  ?ALT 10  ?ALKPHOS 62  ?BILITOT 0.5  ?PROT 5.7*  ?ALBUMIN 2.8*  ? ?No results for input(s): LIPASE, AMYLASE in the last 168 hours. ?No results for input(s): AMMONIA in the last 168 hours. ?Coagulation Profile: ?No results for input(s): INR, PROTIME in the last 168 hours. ?Cardiac Enzymes: ?No results for input(s): CKTOTAL, CKMB, CKMBINDEX, TROPONINI in the last 168 hours. ?BNP (last 3 results) ?No results for input(s): PROBNP in the last 8760 hours. ?HbA1C: ?No results for input(s): HGBA1C in the last 72 hours. ? ?CBG: ?Recent Labs  ?Lab 01/14/22 ?1349 01/14/22 ?1608 01/14/22 ?2114 01/15/22 ?0045 01/15/22 ?1209  ?GLUCAP 96 96 114* 102* 118*  ? ?Lipid Profile: ?No results for input(s): CHOL, HDL, LDLCALC, TRIG, CHOLHDL, LDLDIRECT in the last 72 hours. ?Thyroid Function Tests: ?No results  for input(s): TSH, T4TOTAL, FREET4, T3FREE, THYROIDAB in the last 72 hours. ?Anemia Panel: ?Recent Labs  ?  01/15/22 ?9977  ?FOLATE 5.0*  ?TIBC 190*  ?IRON 34  ? ?Sepsis Labs: ?Recent Labs  ?Lab 01/11/22 ?2209 01/12/22 ?0913  ?LATICACIDVEN 0.9 1.2  ? ? ?Recent Results (from the past 240 hour(s))  ?Microscopic Examination     Status: Abnormal  ? Collection Time: 01/06/22  2:17 PM  ? Urine  ?Result Value Ref Range Status  ? WBC, UA 11-30 (A) 0 - 5 /hpf Final  ? RBC 0-2 0 - 2 /hpf Final  ? Epithelial Cells (non renal) 0-10 0 - 10 /hpf Final  ? Bacteria, UA Moderate (A) None seen/Few Final  ?CULTURE, URINE COMPREHENSIVE     Status: Abnormal  ? Collection Time: 01/06/22  2:57 PM  ? Specimen: Urine  ? UR  ?Result Value Ref Range Status  ? Urine Culture, Comprehensive Final report (A)  Final  ? Organism ID, Bacteria Escherichia  coli (A)  Final  ?  Comment: Cefazolin <=4 ug/mL ?Cefazolin with an MIC <=16 predicts susceptibility to the oral agents ?cefaclor, cefdinir, cefpodoxime, cefprozil, cefuroxime, cephalexin, ?and loracarbef when used for therapy of uncomplicated urinary tract ?infections due to E. coli, Klebsiella pneumoniae, and Proteus ?mirabilis. ?Greater than 100,000 colony forming units per mL ?  ? ANTIMICROBIAL SUSCEPTIBILITY Comment  Final  ?  Comment:       ** S = Susceptible; I = Intermediate; R = Resistant ** ?                   P = Positive; N = Negative ?            MICS are expressed in micrograms per mL ?   Antibiotic                 RSLT#1    RSLT#2    RSLT#3    RSLT#4 ?Amoxicillin/Clavulanic Acid    S ?Ampicillin                     R ?Cefepime                       S ?Ceftriaxone                    S ?Cefuroxime                     S ?Ciprofloxacin                  S ?Ertapenem                      S ?Gentamicin                     S ?Imipenem                       S ?Levofloxacin                   S ?Meropenem                      S ?Nitrofurantoin                 S ?Piperacillin/Tazobactam         S ?Tetracycline                   R ?Tobramycin                     S ?Trimethoprim/Sulfa             R ?  ?MRSA Next Gen by PCR, Nasal     Status: None  ? Collection Time: 01/12/22  6:23 PM  ? Specimen: [place]

## 2022-01-15 NOTE — TOC Transition Note (Signed)
Transition of Care (TOC) - CM/SW Discharge Note ? ? ?Patient Details  ?Name: Dawn Bradford ?MRN: [id number] ?Date of Birth: 07/01/1936 ? ?Transition of Care (TOC) CM/SW Contact:  ?Raina Mina, LCSWA ?Phone Number: ?01/15/2022, 1:11 PM ? ? ?Clinical Narrative:   CSW spoke with patients daughter [name]. [name] stated she was interested with hospice at home when her mother is discharged. CSW told [name] that she would reach out to the hospital liaison. ? ?Message left with [facility] about daughter being interested in hospice.  ? ? ? ?  ?Barriers to Discharge: Continued Medical Work up ? ? ?Patient Goals and CMS Choice ?Patient states their goals for this hospitalization and ongoing recovery are:: for pt to get better ?  ?Choice offered to / list presented to : Adult Children ? ?Discharge Placement ?  ?           ?  ?  ?  ?  ? ?Discharge Plan and Services ?In-house Referral: NA ?  ?Post Acute Care Choice: Home Health          ?DME Arranged:  (hoyar lift) ?DME Agency: [employer] ?Date DME Agency Contacted: 01/13/22 ?Time DME Agency Contacted: [phone] ?Representative spoke with at DME Agency: Suanne Marker ?HH Arranged: RN, PT, OT ?[place] Agency: [place] ([place]) ?Date HH Agency Contacted: 01/13/22 ?Time [place]: 6644 ?Representative spoke with at [place]: Corene Cornea ? ?Social Determinants of Health (SDOH) Interventions ?  ? ? ?Readmission Risk Interventions ?   ? View : No data to display.  ?  ?  ?  ? ? ? ? ? ?

## 2022-01-15 NOTE — Progress Notes (Signed)
Dawn Bradford ? ?MRN: [id number] ? ?DOB/AGE: [birth year] 86 y.o. ? ?Primary Care Physician:Pcp, No ? ?Admit date: 01/11/2022 ? ?Chief Complaint:  ?Chief Complaint  ?Patient presents with  ? Altered Mental Status  ? ? ?S-Pt presented on  01/11/2022 with  ?Chief Complaint  ?Patient presents with  ? Altered Mental Status  ?[name] ?Patient is a 86 year old Caucasian female with a past medical history of ESRD, on hemodialysis, on Monday Wednesday Friday schedule, chronic diastolic CHF, COPD, cardiac disease, diabetes mellitus type 2, hypertension, dyslipidemia, hypothyroidism who was brought to the hospital with chief complaint of altered mental status. ? ?Patient is known to our clinic and receives outpatient dialysis at [facility] on a MWF schedule supervised by Dr. Candiss Norse.  ? ?Patient was seen today on second floor ?Patient caregiver was present in the room ?Patient is more alert than yesterday ?Patient offers no new specific physical complaints ? ? ?Medication  ? ? ? aspirin EC  81 mg Oral Daily  ? carvedilol  25 mg Oral Q supper  ? carvedilol  25 mg Oral Once per day on Sun Tue Thu Sat  ? cefTRIAXone (ROCEPHIN) IM  1 g Intramuscular Q24H  ? epoetin (EPOGEN/PROCRIT) injection  10,000 Units Intravenous Q M,W,F-HD  ? FLUoxetine  40 mg Oral Daily  ? heparin injection (subcutaneous)  5,000 Units Subcutaneous Q8H  ? hyoscyamine  0.125 mg Oral Q4H  ? insulin aspart  0-15 Units Subcutaneous TID AC & HS  ? irbesartan  75 mg Oral Daily  ? isosorbide dinitrate  5 mg Oral QHS  ? levothyroxine  75 mcg Oral Q0600  ? mouth rinse  15 mL Mouth Rinse BID  ? midodrine  5 mg Oral Q M,W,F-HD  ? [START ON 01/16/2022] pantoprazole sodium  40 mg Oral BID  ? polyethylene glycol  17 g Oral Daily  ? rosuvastatin  40 mg Oral Daily  ? ? ? ? ? ? ? ?ROS: Unable to get much data ? ?Physical Exam: ?Vital signs in last 24 hours: ?Temp:  [97 ?F (36.1 ?C)-98.5 ?F (36.9 ?C)] 98.5 ?F (36.9 ?C) (05/13 7846) ?Pulse Rate:  [80-115] 89 (05/13 0442) ?Resp:   [16-26] 20 (05/13 0442) ?BP: (73-126)/(45-85) 91/79 (05/13 0442) ?SpO2:  [96 %-100 %] 100 % (05/13 0442) ?Weight:  [76.4 kg-78.9 kg] 78.9 kg (05/12 1949) ?Weight change: 0 kg ?  ? ?Intake/Output from previous day: ?05/12 0701 - 05/13 0700 ?In: 51.6 [P.O.:50; I.V.:1.6] ?Out: -7  ?No intake/output data recorded. ? ? ?Physical Exam: ? ?General- pt is Open eyes and responds infrequently, chronically ill-appearing ? ? ?Resp- No acute REsp distress, rhonchi present ? ?CVS- S1S2 regular in rate and rhythm ? ?GIT- BS+, soft, Non tender , Non distended ? ?EXT- No LE Edema,  No Cyanosis ? ?Access- LUE AVF ? ? ? ?Lab Results: ? ?CBC ? ?Recent Labs  ?  01/14/22 ?0626 01/15/22 ?9629  ?WBC 9.0 10.1  ?HGB 9.0* 8.3*  ?HCT 29.1* 27.1*  ?PLT 124* 137*  ? ? ?BMET ? ?Recent Labs  ?  01/14/22 ?0626 01/15/22 ?5284  ?NA 138 138  ?K 3.8 4.0  ?CL 102 104  ?CO2 28 29  ?GLUCOSE 128* 101*  ?BUN 23 15  ?CREATININE 3.69* 2.79*  ?CALCIUM 8.4* 8.3*  ? ? ? ? ?Most recent Creatinine trend  ?Lab Results  ?Component Value Date  ? CREATININE 2.79 (H) 01/15/2022  ? CREATININE 3.69 (H) 01/14/2022  ? CREATININE 2.70 (H) 01/13/2022  ?  ? ? ?MICRO ? ? ?  Recent Results (from the past 240 hour(s))  ?Microscopic Examination     Status: Abnormal  ? Collection Time: 01/06/22  2:17 PM  ? Urine  ?Result Value Ref Range Status  ? WBC, UA 11-30 (A) 0 - 5 /hpf Final  ? RBC 0-2 0 - 2 /hpf Final  ? Epithelial Cells (non renal) 0-10 0 - 10 /hpf Final  ? Bacteria, UA Moderate (A) None seen/Few Final  ?CULTURE, URINE COMPREHENSIVE     Status: Abnormal  ? Collection Time: 01/06/22  2:57 PM  ? Specimen: Urine  ? UR  ?Result Value Ref Range Status  ? Urine Culture, Comprehensive Final report (A)  Final  ? Organism ID, Bacteria Escherichia coli (A)  Final  ?  Comment: Cefazolin <=4 ug/mL ?Cefazolin with an MIC <=16 predicts susceptibility to the oral agents ?cefaclor, cefdinir, cefpodoxime, cefprozil, cefuroxime, cephalexin, ?and loracarbef when used for therapy of  uncomplicated urinary tract ?infections due to E. coli, Klebsiella pneumoniae, and Proteus ?mirabilis. ?Greater than 100,000 colony forming units per mL ?  ? ANTIMICROBIAL SUSCEPTIBILITY Comment  Final  ?  Comment:       ** S = Susceptible; I = Intermediate; R = Resistant ** ?                   P = Positive; N = Negative ?            MICS are expressed in micrograms per mL ?   Antibiotic                 RSLT#1    RSLT#2    RSLT#3    RSLT#4 ?Amoxicillin/Clavulanic Acid    S ?Ampicillin                     R ?Cefepime                       S ?Ceftriaxone                    S ?Cefuroxime                     S ?Ciprofloxacin                  S ?Ertapenem                      S ?Gentamicin                     S ?Imipenem                       S ?Levofloxacin                   S ?Meropenem                      S ?Nitrofurantoin                 S ?Piperacillin/Tazobactam        S ?Tetracycline                   R ?Tobramycin                     S ?Trimethoprim/Sulfa             R ?  ?MRSA Next Gen by PCR,  Nasal     Status: None  ? Collection Time: 01/12/22  6:23 PM  ? Specimen: Nasal Mucosa; Nasal Swab  ?Result Value Ref Range Status  ? MRSA by PCR Next Gen NOT DETECTED NOT DETECTED Final  ?  Comment: (NOTE) ?The GeneXpert MRSA Assay (FDA approved for NASAL specimens only), ?is one component of a comprehensive MRSA colonization surveillance ?program. It is not intended to diagnose MRSA infection nor to guide ?or monitor treatment for MRSA infections. ?Test performance is not FDA approved in patients less than 2 years ?old. ?Performed at [facility], [place], ?[place] [address] ?  ?  ? ? ? ? ? ?Impression: ? ?Patient is 86 year old Caucasian female with a past medical history of end-stage renal disease on hemodialysis-on Monday Wednesday Friday schedule, diastolic CHF, COPD, coronary disease, diabetes mellitus, hypertension, hyperlipidemia, hypothyroidism who is being admitted with chief complaint  of weakness and altered mental status ? ?-Metabolic encephalopathy/altered mental status ?UTI ?End-stage renal disease ?Hypertension ?Hypothyroidism ?Diabetes mellitus ? ?Patient is known to our clinic and receives outpatient dialysis at [facility] on a MWF schedule supervised by Dr. Candiss Norse.  ? ?1)Renal   ? ? ?End-stage renal disease ?Patient is on hemodialysis ?Patient is on Monday Wednesday Friday schedule ?Patient was last dialyzed yesterday on Friday ?No need for renal placement therapy today ? ? ?2)Hypotension ?Patient is on midodrine ? ?3)Anemia of chronic disease ? ? ?  Latest Ref Rng & Units 01/15/2022  ?  5:44 AM 01/14/2022  ?  6:26 AM 01/13/2022  ?  5:46 AM  ?CBC  ?WBC 4.0 - 10.5 K/uL 10.1   9.0   9.3    ?Hemoglobin 12.0 - 15.0 g/dL 8.3   9.0   9.0    ?Hematocrit 36.0 - 46.0 % 27.1   29.1   27.7    ?Platelets 150 - 400 K/uL 137   124   122    ?  ? ?Patient hemoglobin was low, now better  ?Patient did receive PRBC yesterday ? patient on Epogen-dose adjusted  ? 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 ? ? ?Lab Results  ?Component Value Date  ? PTH 59 08/13/2018  ? CALCIUM 8.3 (L) 01/15/2022  ? PHOS 4.7 (H) 09/24/2021  ? ? ? ? ?Phosphorus at goal. ? ? ?5) altered mental status ?Most likely thought to be secondary to infectious etiologies ?Primary team is following ? ? ?6) Electrolytes  ? ? ?  Latest Ref Rng & Units 01/15/2022  ?  5:44 AM 01/14/2022  ?  6:26 AM 01/13/2022  ?  5:46 AM  ?BMP  ?Glucose 70 - 99 mg/dL 101   128   118    ?BUN 8 - 23 mg/dL 15   23   17     ?Creatinine 0.44 - 1.00 mg/dL 2.79   3.69   2.70    ?Sodium 135 - 145 mmol/L 138   138   136    ?Potassium 3.5 - 5.1 mmol/L 4.0   3.8   4.2    ?Chloride 98 - 111 mmol/L 104   102   103    ?CO2 22 - 32 mmol/L 29   28   27     ?Calcium 8.9 - 10.3 mg/dL 8.3   8.4   8.3    ?  ? ?Sodium ?Normonatremic ? ? ?Potassium ?Normokalemic ? ? ? ?7)Acid base ? ? ?Co2 at goal ? ?8)UTI ?Patient is  on antibiotics ?  Primary team is following ? ? ?Plan: ? ?No need for renal placement therapy today ? ? ? ? ?Dawn Bradford s Theador Hawthorne ?01/15/2022, 8:32 AM  ?

## 2022-01-16 DIAGNOSIS — G9341 Metabolic encephalopathy: Secondary | ICD-10-CM | POA: Diagnosis not present

## 2022-01-16 LAB — CBC
HCT: 29.8 % — ABNORMAL LOW (ref 36.0–46.0)
Hemoglobin: 9.2 g/dL — ABNORMAL LOW (ref 12.0–15.0)
MCH: 33.1 pg (ref 26.0–34.0)
MCHC: 30.9 g/dL (ref 30.0–36.0)
MCV: 107.2 fL — ABNORMAL HIGH (ref 80.0–100.0)
Platelets: 168 10*3/uL (ref 150–400)
RBC: 2.78 MIL/uL — ABNORMAL LOW (ref 3.87–5.11)
RDW: 18.2 % — ABNORMAL HIGH (ref 11.5–15.5)
WBC: 9.8 10*3/uL (ref 4.0–10.5)
nRBC: 0.4 % — ABNORMAL HIGH (ref 0.0–0.2)

## 2022-01-16 LAB — BLOOD GAS, ARTERIAL
Acid-Base Excess: 8.1 mmol/L — ABNORMAL HIGH (ref 0.0–2.0)
Bicarbonate: 34.1 mmol/L — ABNORMAL HIGH (ref 20.0–28.0)
O2 Content: 2 L/min
O2 Saturation: 99.1 %
Patient temperature: 37
pCO2 arterial: 55 mmHg — ABNORMAL HIGH (ref 32–48)
pH, Arterial: 7.4 (ref 7.35–7.45)
pO2, Arterial: 109 mmHg — ABNORMAL HIGH (ref 83–108)

## 2022-01-16 LAB — BASIC METABOLIC PANEL
Anion gap: 5 (ref 5–15)
BUN: 20 mg/dL (ref 8–23)
CO2: 29 mmol/L (ref 22–32)
Calcium: 8.6 mg/dL — ABNORMAL LOW (ref 8.9–10.3)
Chloride: 103 mmol/L (ref 98–111)
Creatinine, Ser: 3.35 mg/dL — ABNORMAL HIGH (ref 0.44–1.00)
GFR, Estimated: 13 mL/min — ABNORMAL LOW (ref >60)
Glucose, Bld: 107 mg/dL — ABNORMAL HIGH (ref 70–99)
Potassium: 4.1 mmol/L (ref 3.5–5.1)
Sodium: 137 mmol/L (ref 135–145)

## 2022-01-16 LAB — GLUCOSE, CAPILLARY
Glucose-Capillary: 112 mg/dL — ABNORMAL HIGH (ref 70–99)
Glucose-Capillary: 146 mg/dL — ABNORMAL HIGH (ref 70–99)
Glucose-Capillary: 128 mg/dL — ABNORMAL HIGH (ref 70–99)
Glucose-Capillary: 122 mg/dL — ABNORMAL HIGH (ref 70–99)

## 2022-01-16 LAB — MAGNESIUM: Magnesium: 2 mg/dL (ref 1.7–2.4)

## 2022-01-16 MED ORDER — IRBESARTAN 150 MG PO TABS
75.0000 mg | ORAL_TABLET | Freq: Every day | ORAL | Status: DC
  Administered 2022-01-18: 75 mg via ORAL
  Filled 2022-01-16 (×4): qty 1

## 2022-01-16 MED ORDER — ISOSORBIDE DINITRATE 10 MG PO TABS
5.0000 mg | ORAL_TABLET | Freq: Every day | ORAL | Status: DC
Start: 2022-01-17 — End: 2022-01-22
  Administered 2022-01-17 – 2022-01-20 (×4): 5 mg via ORAL
  Filled 2022-01-16 (×3): qty 1

## 2022-01-16 NOTE — Progress Notes (Signed)
Dawn Bradford ? ?MRN: [id number] ? ?DOB/AGE: Apr 27, 1936 86 y.o. ? ?Primary Care Physician:Pcp, No ? ?Admit date: 01/11/2022 ? ?Chief Complaint:  ?Chief Complaint  ?Patient presents with  ? Altered Mental Status  ? ? ?S-Pt presented on  01/11/2022 with  ?Chief Complaint  ?Patient presents with  ? Altered Mental Status  ?[name] ?Patient is a 86 year old Caucasian female with a past medical history of ESRD, on hemodialysis, on Monday Wednesday Friday schedule, chronic diastolic CHF, COPD, cardiac disease, diabetes mellitus type 2, hypertension, dyslipidemia, hypothyroidism who was brought to the hospital with chief complaint of altered mental status. ? ?Patient is known to our clinic and receives outpatient dialysis at [facility] on a MWF schedule supervised by Dr. Candiss Norse.  ? ?Patient was seen today on second floor ?Patient caregiver was present in the room ? ?Patient was much more alert than before ?Patient responded appropriately to the questions ?Patient offers no new specific physical complaints ? ? ?Medication  ? ? ? aspirin EC  81 mg Oral Daily  ? carvedilol  3.125 mg Oral Q supper  ? carvedilol  3.125 mg Oral Once per day on Sun Tue Thu Sat  ? cefTRIAXone (ROCEPHIN) IM  1 g Intramuscular Q24H  ? epoetin (EPOGEN/PROCRIT) injection  10,000 Units Intravenous Q M,W,F-HD  ? FLUoxetine  40 mg Oral Daily  ? folic acid  1 mg Oral Daily  ? heparin injection (subcutaneous)  5,000 Units Subcutaneous Q8H  ? insulin aspart  0-15 Units Subcutaneous TID AC & HS  ? [START ON 01/17/2022] irbesartan  75 mg Oral Daily  ? [START ON 01/17/2022] isosorbide dinitrate  5 mg Oral QHS  ? levothyroxine  75 mcg Oral Q0600  ? mouth rinse  15 mL Mouth Rinse BID  ? midodrine  5 mg Oral Q M,W,F-HD  ? pantoprazole sodium  40 mg Oral BID  ? polyethylene glycol  17 g Oral Daily  ? rosuvastatin  10 mg Oral Daily  ? ? ? ? ? ? ? ?ROS: Unable to get much data ? ?Physical Exam: ?Vital signs in last 24 hours: ?Temp:  [97.5 ?F (36.4 ?C)-97.6 ?F (36.4 ?C)]  97.5 ?F (36.4 ?C) (05/14 1478) ?Pulse Rate:  [76-106] 83 (05/14 0738) ?Resp:  [19-20] 19 (05/14 0738) ?BP: (102-129)/(67-86) 102/81 (05/14 2956) ?SpO2:  [97 %-100 %] 100 % (05/14 0738) ?Weight change:  ?  ? ?Intake/Output from previous day: ?05/13 0701 - 05/14 0700 ?In: 75 [P.O.:420] ?Out: 0  ?Total I/O ?In: 240 [P.O.:240] ?Out: -  ? ? ?Physical Exam: ? ?General- pt is Alert following commandschronically ill-appearing ? ?Resp- No acute REsp distress, rhonchi present ? ?CVS- S1S2 regular in rate and rhythm ? ?GIT- BS+, soft, Non tender , Non distended ? ?EXT- No LE Edema,  No Cyanosis ? ?Access- LUE AVF ? ? ? ?Lab Results: ? ?CBC ? ?Recent Labs  ?  01/15/22 ?2130 01/16/22 ?0604  ?WBC 10.1 9.8  ?HGB 8.3* 9.2*  ?HCT 27.1* 29.8*  ?PLT 137* 168  ? ? ?BMET ? ?Recent Labs  ?  01/15/22 ?8657 01/16/22 ?0604  ?NA 138 137  ?K 4.0 4.1  ?CL 104 103  ?CO2 29 29  ?GLUCOSE 101* 107*  ?BUN 15 20  ?CREATININE 2.79* 3.35*  ?CALCIUM 8.3* 8.6*  ? ? ? ? ?Most recent Creatinine trend  ?Lab Results  ?Component Value Date  ? CREATININE 3.35 (H) 01/16/2022  ? CREATININE 2.79 (H) 01/15/2022  ? CREATININE 3.69 (H) 01/14/2022  ?  ? ? ?  MICRO ? ? ?Recent Results (from the past 240 hour(s))  ?MRSA Next Gen by PCR, Nasal     Status: None  ? Collection Time: 01/12/22  6:23 PM  ? Specimen: Nasal Mucosa; Nasal Swab  ?Result Value Ref Range Status  ? MRSA by PCR Next Gen NOT DETECTED NOT DETECTED Final  ?  Comment: (NOTE) ?The GeneXpert MRSA Assay (FDA approved for NASAL specimens only), ?is one component of a comprehensive MRSA colonization surveillance ?program. It is not intended to diagnose MRSA infection nor to guide ?or monitor treatment for MRSA infections. ?Test performance is not FDA approved in patients less than 2 years ?old. ?Performed at [facility], [place], ?[place] [address] ?  ?  ? ? ? ? ? ?Impression: ? ?Patient is 86 year old Caucasian female with a past medical history of end-stage renal disease on  hemodialysis-on Monday Wednesday Friday schedule, diastolic CHF, COPD, coronary disease, diabetes mellitus, hypertension, hyperlipidemia, hypothyroidism who is being admitted with chief complaint of weakness and altered mental status ? ?-Metabolic encephalopathy/altered mental status ?UTI ?End-stage renal disease ?Hypertension ?Hypothyroidism ?Diabetes mellitus ? ?Patient is known to our clinic and receives outpatient dialysis at [facility] on a MWF schedule supervised by Dr. Candiss Norse.  ? ?1)Renal   ? ? ?End-stage renal disease ?Patient is on hemodialysis ?Patient is on Monday Wednesday Friday schedule ?Patient was last dialyzed yesterday on Friday ?No need for renal placement therapy today ? ? ?2)Hypotension ?Patient is on midodrine ? ?3)Anemia of chronic disease ? ? ?  Latest Ref Rng & Units 01/16/2022  ?  6:04 AM 01/15/2022  ?  5:44 AM 01/14/2022  ?  6:26 AM  ?CBC  ?WBC 4.0 - 10.5 K/uL 9.8   10.1   9.0    ?Hemoglobin 12.0 - 15.0 g/dL 9.2   8.3   9.0    ?Hematocrit 36.0 - 46.0 % 29.8   27.1   29.1    ?Platelets 150 - 400 K/uL 168   137   124    ?  ? ?Patient hemoglobin was low, now better  ?Patient did receive PRBC yesterday ? patient on Epogen-dose adjusted  ? 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 ? ? ?Lab Results  ?Component Value Date  ? PTH 59 08/13/2018  ? CALCIUM 8.6 (L) 01/16/2022  ? PHOS 4.7 (H) 09/24/2021  ? ? ? ? ?Phosphorus at goal. ? ? ?5) altered mental status ?Most likely thought to be secondary to infectious etiologies. ?Clinically better ?Primary team is following ? ? ?6) Electrolytes  ? ? ?  Latest Ref Rng & Units 01/16/2022  ?  6:04 AM 01/15/2022  ?  5:44 AM 01/14/2022  ?  6:26 AM  ?BMP  ?Glucose 70 - 99 mg/dL 107   101   128    ?BUN 8 - 23 mg/dL 20   15   23     ?Creatinine 0.44 - 1.00 mg/dL 3.35   2.79   3.69    ?Sodium 135 - 145 mmol/L 137   138   138    ?Potassium 3.5 - 5.1 mmol/L 4.1   4.0   3.8    ?Chloride 98 - 111  mmol/L 103   104   102    ?CO2 22 - 32 mmol/L 29   29   28     ?Calcium 8.9 - 10.3 mg/dL 8.6   8.3   8.4    ?  ? ?Sodium ?Normonatremic ? ? ?  Potassium ?Normokalemic ? ? ? ?7)Acid base ? ? ?Co2 at goal ? ?8)UTI ?Patient is on antibiotics ?Primary team is following ? ? ?Plan: ? ?No need for renal placement therapy today ?We will dialyze patient in the morning ? ? ? ?Leona Pressly s Theador Hawthorne ?01/16/2022, 4:59 PM  ?

## 2022-01-16 NOTE — Progress Notes (Signed)
Engineer, manufacturing systems Liaison Follow up  ? ?Patient with no plans to discharge today.  Patient receiving Rocephin 1gm IM daily.  Per Dr Dwyane Dee, patient will have hemodialysis tomorrow.  Will continue to follow to see if family wishes to stop dialysis.  Ongoing conversation with Immunologist for hospice eligibility depending on what the family wishes.  Aggressive treatment versus a comfort approach.   ? ?Thank you for allowing participation in this patient's care. ? ?Dimas Aguas, RN ?[facility] ?[phone] ?

## 2022-01-16 NOTE — Progress Notes (Signed)
?PROGRESS NOTE ? ? ? ?Dawn Bradford  UYQ:[id number] DOB: 03/30/1936 DOA: 01/11/2022 ?PCP: Pcp, No  ? ?Brief Narrative:  ?86 year old with history of ESRD on HD MWF, diastolic CHF, COPD, CAD, DM 2, HTN, HLD, hypothyroidism admitted for weakness and altered mental status worsening over the past 8 days.  Recently had her AV fistula decannulated with some drop in hemoglobin.  She also saw outpatient neurology where she was diagnosed with possible UTI and was prescribed Cipro but had not taken it.  Upon admission her hemoglobin was 6.7, EKG showed A-fib.  CT head was negative for acute pathology but showed chronic sinusitis, CT abdomen pelvis showed cardiomegaly with bilateral pleural effusion, 2.2 cm cystic pancreatic lesion.  Also started on IV Rocephin.  ? ? ?Assessment & Plan: ? Principal Problem: ?  Metabolic encephalopathy ?Active Problems: ?  Acute lower UTI ?  End-stage renal disease on hemodialysis ([place]) ?  Iron deficiency anemia due to chronic blood loss ?  Essential hypertension ?  Acquired hypothyroidism ?  Anxiety and depression ?  Type 2 diabetes mellitus with chronic kidney disease, with long-term current use of insulin ([place]) ?  ? ? ?Assessment and Plan: ?* Metabolic encephalopathy ?Urinary tract infection, secondary to E. coli ?- Outptn Ucx grew Ecoli. Cont IV Abx. She became lethargic after HD on 5/12 ?-CT of the head is negative for acute pathology.  No focal neurodeficits.   ?Closely continue to monitor this. ?ABGs reviewed, no significant CO2 retention,  ?Patient last IV so ceftriaxone was switched to ? ?Iron deficiency anemia due to chronic blood loss and macrocytosis ?-Had recent decannulation of her AV fistula.  Hemoccult is negative.   ?Status post 1 unit PRBC 5/10, hemoglobin 9.0 ?Folate deficiency, started folic acid oral supplement. ? ?End-stage renal disease on hemodialysis ([place]) ?-Nephrology team notified.  Status post HD 5/10.  EPO increased ? ?History of CAD status post bypass ?History of  CVA ?-On aspirin and Crestor ?-Continue Coreg, Avapro, isosorbide ? ?Type 2 diabetes mellitus with chronic kidney disease, with long-term current use of insulin ([place]) ?-Sliding scale and Accu-Cheks. A1c = 5.7 ? ?Anxiety and depression ?-Currently on Prozac and as needed Klonopin ? ?Acquired hypothyroidism ?-Synthroid ? ?Essential hypertension ?-Currently on Coreg, Avapro, isosorbide.  Also gets midodrine prior to HD? ?5/13, blood pressure was very soft dropped yesterday evening ?Decrease dose of Coreg with holding parameters, use holding parameters for Avapro and Imdur ? ? ?Sacral decubitus ulcer stage II-present POA ?Turn patient every 2 hourly and continue fall precautions ? ?PT/OT ordered ? ? ?DVT prophylaxis: DC subcu heparin.  Place SCDs ?Code Status: DNR ?Family Communication: Spoke with [name] and Deanna ? ?Status is: Inpatient ?Patient was very lethargic after HD on 5/12, unsafe DC.  Still patient is very lethargic and sleepy, blood pressure is very soft, we will continue to monitor closely and plan for disposition in 1 to 2 days  ?TOC consulted hospice team for hospice services at home as per family request.  If no hemodialysis, patient can be discharged home when arrangement will be done. ? ? ?Subjective: ?No significant overnight events, patient is alert and awake today, AAO x1 at baseline.  Patient denied any complaints, seems to be resting comfortably on the bed. ? ? ? ?Examination: ?Constitutional: NAD, awake and alert AAO x1 ?Respiratory: Clear to auscultation bilaterally ?Cardiovascular: Normal sinus rhythm, no rubs ?Abdomen: Nontender nondistended good bowel sounds ?Musculoskeletal: No edema noted ?Skin: No rashes seen ?Neurologic: Alert to name, no focal neuro deficits.  ?  Psychiatric: Confused, pleasantly demented at baseline, noncombative, ?Objective: ?Vitals:  ? 01/15/22 1737 01/15/22 1932 01/16/22 0545 01/16/22 0738  ?BP: 112/86 118/67 129/79 102/81  ?Pulse: (!) 106 76 97 83  ?Resp:  20 20 19    ?Temp:  (!) 97.5 ?F (36.4 ?C) 97.6 ?F (36.4 ?C) (!) 97.5 ?F (36.4 ?C)  ?TempSrc:  Axillary Oral   ?SpO2: 97% 99% 99% 100%  ?Weight:      ?Height:      ? ? ?Intake/Output Summary (Last 24 hours) at 01/16/2022 1337 ?Last data filed at 01/16/2022 0606 ?Gross per 24 hour  ?Intake 180 ml  ?Output 0 ml  ?Net 180 ml  ? ?Filed Weights  ? 01/14/22 0859 01/14/22 1215 01/14/22 1949  ?Weight: 78.4 kg 76.4 kg 78.9 kg  ? ? ? ?Data Reviewed:  ? ?CBC: ?Recent Labs  ?Lab 01/12/22 ?3329 01/13/22 ?5188 01/14/22 ?4166 01/15/22 ?0630 01/16/22 ?0604  ?WBC 7.6 9.3 9.0 10.1 9.8  ?HGB 9.0* 9.0* 9.0* 8.3* 9.2*  ?HCT 28.7* 27.7* 29.1* 27.1* 29.8*  ?MCV 103.2* 100.0 104.3* 105.9* 107.2*  ?PLT 123* 122* 124* 137* 168  ? ?Basic Metabolic Panel: ?Recent Labs  ?Lab 01/12/22 ?1601 01/13/22 ?0932 01/14/22 ?3557 01/15/22 ?3220 01/16/22 ?0604  ?NA 142 136 138 138 137  ?K 4.0 4.2 3.8 4.0 4.1  ?CL 106 103 102 104 103  ?CO2 28 27 28 29 29   ?GLUCOSE 140* 118* 128* 101* 107*  ?BUN 26* 17 23 15 20   ?CREATININE 3.78* 2.70* 3.69* 2.79* 3.35*  ?CALCIUM 8.2* 8.3* 8.4* 8.3* 8.6*  ?MG  --  1.7 2.1 1.9 2.0  ? ?GFR: ?Estimated Creatinine Clearance: 13 mL/min (A) (by C-G formula based on SCr of 3.35 mg/dL (H)). ?Liver Function Tests: ?Recent Labs  ?Lab 01/11/22 ?1705  ?AST 13*  ?ALT 10  ?ALKPHOS 62  ?BILITOT 0.5  ?PROT 5.7*  ?ALBUMIN 2.8*  ? ?No results for input(s): LIPASE, AMYLASE in the last 168 hours. ?No results for input(s): AMMONIA in the last 168 hours. ?Coagulation Profile: ?No results for input(s): INR, PROTIME in the last 168 hours. ?Cardiac Enzymes: ?No results for input(s): CKTOTAL, CKMB, CKMBINDEX, TROPONINI in the last 168 hours. ?BNP (last 3 results) ?No results for input(s): PROBNP in the last 8760 hours. ?HbA1C: ?No results for input(s): HGBA1C in the last 72 hours. ? ?CBG: ?Recent Labs  ?Lab 01/15/22 ?1209 01/15/22 ?1655 01/15/22 ?2132 01/16/22 ?0729 01/16/22 ?1156  ?GLUCAP 118* 129* 100* 112* 146*  ? ?Lipid Profile: ?No results for input(s): CHOL,  HDL, LDLCALC, TRIG, CHOLHDL, LDLDIRECT in the last 72 hours. ?Thyroid Function Tests: ?No results for input(s): TSH, T4TOTAL, FREET4, T3FREE, THYROIDAB in the last 72 hours. ?Anemia Panel: ?Recent Labs  ?  01/15/22 ?2542  ?VITAMINB12 487  ?FOLATE 5.0*  ?TIBC 190*  ?IRON 34  ? ?Sepsis Labs: ?Recent Labs  ?Lab 01/11/22 ?2209 01/12/22 ?0913  ?LATICACIDVEN 0.9 1.2  ? ? ?Recent Results (from the past 240 hour(s))  ?Microscopic Examination     Status: Abnormal  ? Collection Time: 01/06/22  2:17 PM  ? Urine  ?Result Value Ref Range Status  ? WBC, UA 11-30 (A) 0 - 5 /hpf Final  ? RBC 0-2 0 - 2 /hpf Final  ? Epithelial Cells (non renal) 0-10 0 - 10 /hpf Final  ? Bacteria, UA Moderate (A) None seen/Few Final  ?CULTURE, URINE COMPREHENSIVE     Status: Abnormal  ? Collection Time: 01/06/22  2:57 PM  ? Specimen: Urine  ? UR  ?Result Value Ref  Range Status  ? Urine Culture, Comprehensive Final report (A)  Final  ? Organism ID, Bacteria Escherichia coli (A)  Final  ?  Comment: Cefazolin <=4 ug/mL ?Cefazolin with an MIC <=16 predicts susceptibility to the oral agents ?cefaclor, cefdinir, cefpodoxime, cefprozil, cefuroxime, cephalexin, ?and loracarbef when used for therapy of uncomplicated urinary tract ?infections due to E. coli, Klebsiella pneumoniae, and Proteus ?mirabilis. ?Greater than 100,000 colony forming units per mL ?  ? ANTIMICROBIAL SUSCEPTIBILITY Comment  Final  ?  Comment:       ** S = Susceptible; I = Intermediate; R = Resistant ** ?                   P = Positive; N = Negative ?            MICS are expressed in micrograms per mL ?   Antibiotic                 RSLT#1    RSLT#2    RSLT#3    RSLT#4 ?Amoxicillin/Clavulanic Acid    S ?Ampicillin                     R ?Cefepime                       S ?Ceftriaxone                    S ?Cefuroxime                     S ?Ciprofloxacin                  S ?Ertapenem                      S ?Gentamicin                     S ?Imipenem                       S ?Levofloxacin                    S ?Meropenem                      S ?Nitrofurantoin                 S ?Piperacillin/Tazobactam        S ?Tetracycline                   R ?Tobramycin                     S ?Trimethoprim/Sulfa

## 2022-01-17 DIAGNOSIS — G9341 Metabolic encephalopathy: Secondary | ICD-10-CM | POA: Diagnosis not present

## 2022-01-17 LAB — GLUCOSE, CAPILLARY
Glucose-Capillary: 134 mg/dL — ABNORMAL HIGH (ref 70–99)
Glucose-Capillary: 106 mg/dL — ABNORMAL HIGH (ref 70–99)
Glucose-Capillary: 141 mg/dL — ABNORMAL HIGH (ref 70–99)
Glucose-Capillary: 90 mg/dL (ref 70–99)

## 2022-01-17 LAB — BASIC METABOLIC PANEL
Anion gap: 7 (ref 5–15)
BUN: 13 mg/dL (ref 8–23)
CO2: 30 mmol/L (ref 22–32)
Calcium: 8.1 mg/dL — ABNORMAL LOW (ref 8.9–10.3)
Chloride: 97 mmol/L — ABNORMAL LOW (ref 98–111)
Creatinine, Ser: 2.26 mg/dL — ABNORMAL HIGH (ref 0.44–1.00)
GFR, Estimated: 21 mL/min — ABNORMAL LOW (ref >60)
Glucose, Bld: 120 mg/dL — ABNORMAL HIGH (ref 70–99)
Potassium: 3.6 mmol/L (ref 3.5–5.1)
Sodium: 134 mmol/L — ABNORMAL LOW (ref 135–145)

## 2022-01-17 LAB — CBC
HCT: 27.5 % — ABNORMAL LOW (ref 36.0–46.0)
Hemoglobin: 8.5 g/dL — ABNORMAL LOW (ref 12.0–15.0)
MCH: 32.1 pg (ref 26.0–34.0)
MCHC: 30.9 g/dL (ref 30.0–36.0)
MCV: 103.8 fL — ABNORMAL HIGH (ref 80.0–100.0)
Platelets: 135 10*3/uL — ABNORMAL LOW (ref 150–400)
RBC: 2.65 MIL/uL — ABNORMAL LOW (ref 3.87–5.11)
RDW: 18.3 % — ABNORMAL HIGH (ref 11.5–15.5)
WBC: 9.8 10*3/uL (ref 4.0–10.5)
nRBC: 0.3 % — ABNORMAL HIGH (ref 0.0–0.2)

## 2022-01-17 LAB — MAGNESIUM: Magnesium: 1.7 mg/dL (ref 1.7–2.4)

## 2022-01-17 LAB — PHOSPHORUS: Phosphorus: 2.7 mg/dL (ref 2.5–4.6)

## 2022-01-17 MED ORDER — AMOXICILLIN-POT CLAVULANATE 500-125 MG PO TABS
1.0000 | ORAL_TABLET | Freq: Every day | ORAL | Status: DC
  Administered 2022-01-18: 500 mg via ORAL
  Filled 2022-01-17 (×2): qty 1

## 2022-01-17 MED ORDER — NYSTATIN 100000 UNIT/GM EX POWD
Freq: Three times a day (TID) | CUTANEOUS | Status: DC
  Filled 2022-01-17: qty 15

## 2022-01-17 MED ORDER — EPOETIN ALFA 10000 UNIT/ML IJ SOLN
INTRAMUSCULAR | Status: AC
  Filled 2022-01-17: qty 1

## 2022-01-17 MED ORDER — AMOXICILLIN-POT CLAVULANATE 500-125 MG PO TABS
1.0000 | ORAL_TABLET | ORAL | Status: DC
  Filled 2022-01-17: qty 1

## 2022-01-17 NOTE — Progress Notes (Signed)
OT Cancellation Note ? ?Patient Details ?Name: Dawn Bradford ?MRN: [id number] ?DOB: [birth year] ? ? ?Cancelled Treatment:    Reason Eval/Treat Not Completed: Patient at procedure or test/ unavailable. Pt noted to be off the floor for HD, unavailable at this time. Will continue to follow POC at later date/time as pt available.  ? ?Dessie Coma, M.S. OTR/L  ?01/17/22, 10:51 AM  ?ascom [phone] ? ?

## 2022-01-17 NOTE — Progress Notes (Signed)
Patient vomited after eating  breakfast this am and after a few bites with her dinner. Dr Dwyane Dee notified and order received to change diet to full liquid ?

## 2022-01-17 NOTE — Progress Notes (Signed)
?PROGRESS NOTE ? ? ? ?Dawn Bradford  WNU:[id number] DOB: 09-Feb-1936 DOA: 01/11/2022 ?PCP: Pcp, No  ? ? ?Brief Narrative:  ?This 86 year old female with history of ESRD on HD MWF, diastolic CHF, COPD, CAD, DM 2, HTN, HLD, hypothyroidism admitted for  generalized weakness and altered mental status worsening over the past 8 days.  Recently had her AV fistula decannulated with some drop in hemoglobin.  She also saw outpatient neurology where she was diagnosed with possible UTI and was prescribed Cipro but had not taken it.  Upon admission her hemoglobin was 6.7, EKG showed A-fib.  CT head was negative for acute pathology but showed chronic sinusitis, CT abdomen pelvis showed cardiomegaly with bilateral pleural effusion, 2.2 cm cystic pancreatic lesion. She is started on IV Rocephin.  ? ?Assessment & Plan: ?  ?Principal Problem: ?  Metabolic encephalopathy ?Active Problems: ?  Acute lower UTI ?  End-stage renal disease on hemodialysis ([place]) ?  Iron deficiency anemia due to chronic blood loss ?  Essential hypertension ?  Acquired hypothyroidism ?  Anxiety and depression ?  Type 2 diabetes mellitus with chronic kidney disease, with long-term current use of insulin ([place]) ? ?Metabolic Encephalopathy: ?UTI secondary to E. coli. ?-Outpatient urine culture grew E. coli.  Cont IV Abx. She became lethargic after HD on 5/12 ?-CT of the head is negative for acute pathology.  No focal neurodeficits.   ?Continue to monitor neurochecks. ?ABGs reviewed, no significant CO2 retention,  ?Continue IV ceftriaxone. ? ?Iron deficiency anemia: ?Likely Chronic blood loss and macrocytosis. ?She has recent decannulation of her AV fistula.  Hemoccult is negative.   ?Status post 1 unit PRBC on 5/10, hemoglobin 9.0 ?Folate deficiency, continue folic acid supplement. ?  ?End-stage renal disease on hemodialysis ([place]) ?-Nephrology team notified.  Status post HD 5/10.  EPO increased. ?Next hemodialysis 01/17/2022. ?  ?History of CAD: s/p CABG ?History of  CVA ?Continue aspirin and Crestor ?Continue Coreg, Avapro, isosorbide ?  ?Type 2 diabetes mellitus with chronic kidney disease, with long-term current use of insulin ([place]) ?-Sliding scale and Accu-Cheks. A1c = 5.7 ?  ?Anxiety and depression ?Continue Prozac and as needed Klonopin. ?  ?Acquired hypothyroidism ?Continue Synthroid. ?  ?Essential hypertension ?Currently on Coreg, Avapro, isosorbide.  Also gets midodrine prior to HD? ?5/13, blood pressure was very soft dropped yesterday evening ?Decrease dose of Coreg with holding parameters, use holding parameters for Avapro and Imdur ?  ?  ?Sacral decubitus ulcer stage II-present POA ?Turn patient every 2 hourly and continue fall precautions ? ? ?DVT prophylaxis: SCDs ?Code Status: DNR ?Family Communication:  Daughter At bedside. ?Disposition Plan: ?Patient was very lethargic after hemodialysis on 5/12, unsafe DC. ?TOC consulted hospice team for hospice services at home as per family request. ?If no hemodialysis patient can be discharged home with arrangement to be done. ? ?Consultants:  ?Nephrology ?Palliative care ? ?Procedures: Scheduled hemodialysis. ?Antimicrobials:   ?Anti-infectives (From admission, onward)  ? ? Start     Dose/Rate Route Frequency Ordered Stop  ? 01/15/22 0015  cefTRIAXone (ROCEPHIN) injection 1 g       ? 1 g Intramuscular Every 24 hours 01/14/22 2329    ? 01/13/22 0000  cefTRIAXone (ROCEPHIN) 1 g in sodium chloride 0.9 % 100 mL IVPB  Status:  Discontinued       ? 1 g ?200 mL/hr over 30 Minutes Intravenous Every 24 hours 01/12/22 0153 01/14/22 2329  ? 01/11/22 2215  cefTRIAXone (ROCEPHIN) 1 g in sodium chloride 0.9 %  100 mL IVPB       ? 1 g ?200 mL/hr over 30 Minutes Intravenous  Once 01/11/22 2201 01/12/22 0112  ? ?  ?  ? ?Subjective: ?Patient was seen and examined at bedside.  No overnight events.   ?She appears confused, deconditioned, following commands.  She is resting comfortably. ? ?Objective: ?Vitals:  ? 01/17/22 1100 01/17/22 1115  01/17/22 1130 01/17/22 1145  ?BP: (!) 92/57 (!) 80/63 (!) 80/46 (!) 78/35  ?Pulse: 97 (!) 108 (!) 135 94  ?Resp: (!) 26 (!) 25 (!) 25 19  ?Temp:      ?TempSrc:      ?SpO2: 98% 97% 99% 98%  ?Weight:      ?Height:      ? ? ?Intake/Output Summary (Last 24 hours) at 01/17/2022 1153 ?Last data filed at 01/16/2022 1350 ?Gross per 24 hour  ?Intake 240 ml  ?Output --  ?Net 240 ml  ? ?Filed Weights  ? 01/14/22 1949 01/17/22 0943 01/17/22 0953  ?Weight: 78.9 kg 76.7 kg 76.7 kg  ? ? ?Examination: ? ?General exam: Appears comfortable, deconditioned, not in any acute distress. ?Respiratory system:  CTA bilaterally, respiratory effort normal, no wheezing, no crackles. ?Cardiovascular system: S1 & S2 heard, regular rate and rhythm, no murmur. ?Gastrointestinal system: Abdomen is, nontender, nondistended, BS+ ?Central nervous system: Alert and oriented x 1. No focal neurological deficits. ?Extremities: No edema, no cyanosis, no clubbing. ?Skin: No rashes, lesions or ulcers ?Psychiatry: Confused, pleasantly demented at baseline, non combative. ? ? ? ?Data Reviewed: I have personally reviewed following labs and imaging studies ? ?CBC: ?Recent Labs  ?Lab 01/12/22 ?8295 01/13/22 ?6213 01/14/22 ?0865 01/15/22 ?7846 01/16/22 ?0604  ?WBC 7.6 9.3 9.0 10.1 9.8  ?HGB 9.0* 9.0* 9.0* 8.3* 9.2*  ?HCT 28.7* 27.7* 29.1* 27.1* 29.8*  ?MCV 103.2* 100.0 104.3* 105.9* 107.2*  ?PLT 123* 122* 124* 137* 168  ? ?Basic Metabolic Panel: ?Recent Labs  ?Lab 01/12/22 ?9629 01/13/22 ?5284 01/14/22 ?1324 01/15/22 ?4010 01/16/22 ?0604  ?NA 142 136 138 138 137  ?K 4.0 4.2 3.8 4.0 4.1  ?CL 106 103 102 104 103  ?CO2 28 27 28 29 29   ?GLUCOSE 140* 118* 128* 101* 107*  ?BUN 26* 17 23 15 20   ?CREATININE 3.78* 2.70* 3.69* 2.79* 3.35*  ?CALCIUM 8.2* 8.3* 8.4* 8.3* 8.6*  ?MG  --  1.7 2.1 1.9 2.0  ? ?GFR: ?Estimated Creatinine Clearance: 12.8 mL/min (A) (by C-G formula based on SCr of 3.35 mg/dL (H)). ?Liver Function Tests: ?Recent Labs  ?Lab 01/11/22 ?1705  ?AST 13*  ?ALT  10  ?ALKPHOS 62  ?BILITOT 0.5  ?PROT 5.7*  ?ALBUMIN 2.8*  ? ?No results for input(s): LIPASE, AMYLASE in the last 168 hours. ?No results for input(s): AMMONIA in the last 168 hours. ?Coagulation Profile: ?No results for input(s): INR, PROTIME in the last 168 hours. ?Cardiac Enzymes: ?No results for input(s): CKTOTAL, CKMB, CKMBINDEX, TROPONINI in the last 168 hours. ?BNP (last 3 results) ?No results for input(s): PROBNP in the last 8760 hours. ?HbA1C: ?No results for input(s): HGBA1C in the last 72 hours. ?CBG: ?Recent Labs  ?Lab 01/16/22 ?0729 01/16/22 ?1156 01/16/22 ?1722 01/16/22 ?2058 01/17/22 ?0834  ?GLUCAP 112* 146* 128* 122* 134*  ? ?Lipid Profile: ?No results for input(s): CHOL, HDL, LDLCALC, TRIG, CHOLHDL, LDLDIRECT in the last 72 hours. ?Thyroid Function Tests: ?No results for input(s): TSH, T4TOTAL, FREET4, T3FREE, THYROIDAB in the last 72 hours. ?Anemia Panel: ?Recent Labs  ?  01/15/22 ?2725  ?VITAMINB12 487  ?  FOLATE 5.0*  ?TIBC 190*  ?IRON 34  ? ?Sepsis Labs: ?Recent Labs  ?Lab 01/11/22 ?2209 01/12/22 ?0913  ?LATICACIDVEN 0.9 1.2  ? ? ?Recent Results (from the past 240 hour(s))  ?MRSA Next Gen by PCR, Nasal     Status: None  ? Collection Time: 01/12/22  6:23 PM  ? Specimen: Nasal Mucosa; Nasal Swab  ?Result Value Ref Range Status  ? MRSA by PCR Next Gen NOT DETECTED NOT DETECTED Final  ?  Comment: (NOTE) ?The GeneXpert MRSA Assay (FDA approved for NASAL specimens only), ?is one component of a comprehensive MRSA colonization surveillance ?program. It is not intended to diagnose MRSA infection nor to guide ?or monitor treatment for MRSA infections. ?Test performance is not FDA approved in patients less than 2 years ?old. ?Performed at [facility], [place], ?[place] [address] ?  ?  ?Radiology Studies: ?No results found. ? ?Scheduled Meds: ? aspirin EC  81 mg Oral Daily  ? carvedilol  3.125 mg Oral Q supper  ? carvedilol  3.125 mg Oral Once per day on Sun Tue Thu Sat  ?  cefTRIAXone (ROCEPHIN) IM  1 g Intramuscular Q24H  ? epoetin (EPOGEN/PROCRIT) injection  10,000 Units Intravenous Q M,W,F-HD  ? FLUoxetine  40 mg Oral Daily  ? folic acid  1 mg Oral Daily  ? heparin injection (subcutane

## 2022-01-17 NOTE — Progress Notes (Signed)
[facility] hospital liaison note: ? ?Patient had dialysis today. Per Attending physician Dr. Dwyane Dee, family plans to talk again this evening and decide whether patient will continue dialysis. ? ?Liaison will continue to follow and await family decision.  ? ?Thank you for the opportunity to involved in the care of this patient and her family. ? ?Flo Shanks BSN, RN, [facility] ?Hospital Liaison ?Manufacturing engineer ?[phone] ?

## 2022-01-17 NOTE — Progress Notes (Signed)
Order received from Dr Dwyane Dee to renew telemetry and nystatin ordered ?

## 2022-01-17 NOTE — Progress Notes (Signed)
Patient returned from hemodialysis.

## 2022-01-17 NOTE — Progress Notes (Signed)
?[facility]  ?ROUNDING NOTE  ? ?Subjective:  ?Patient is a 86 year old female with history of ESRD on hemodialysis Monday Wednesday Friday, diastolic CHF, COPD, CAD, DM 2, hypertension, hyperlipidemia, hypothyroidism admitted to the hospital for generalized weakness and altered mental status. ? ?Patient was seen and examined in the dialysis unit.  She is currently undergoing dialysis treatment with UF goal of 2.5 L.  She is awake but noted to have intermittent confusion.  ? ? ?Objective:  ?Vital signs in last 24 hours:  ?Temp:  [96.7 ?F (35.9 ?C)-98.3 ?F (36.8 ?C)] 98.2 ?F (36.8 ?C) (05/15 1301) ?Pulse Rate:  [38-135] 101 (05/15 1301) ?Resp:  [12-26] 12 (05/15 1301) ?BP: (78-120)/(35-99) 89/58 (05/15 1301) ?SpO2:  [97 %-100 %] 100 % (05/15 1301) ?Weight:  [76.3 kg-76.7 kg] 76.3 kg (05/15 1301) ? ?Weight change:  ?Filed Weights  ? 01/17/22 0943 01/17/22 0953 01/17/22 1301  ?Weight: 76.7 kg 76.7 kg 76.3 kg  ? ? ?Intake/Output: ?I/O last 3 completed shifts: ?In: 300 [P.O.:300] ?Out: 0  ?  ?Intake/Output this shift: ? Total I/O ?In: -  ?Out: 841 [Other:841] ? ?Physical Exam: ?General: No acute distress  ?Head: Normocephalic, atraumatic. Moist oral mucosal membranes  ?Eyes: Anicteric  ?Neck: Supple  ?Lungs:  Clear to auscultation, normal effort  ?Heart: S1S2 no rubs  ?Abdomen:  Soft, nontender, bowel sounds present  ?Extremities:  peripheral edema.  ?Neurologic: Awake, alert, following commands  ?Skin: No acute rash  ?Access:   ? ? ?Basic Metabolic Panel: ?Recent Labs  ?Lab 01/12/22 ?8588 01/13/22 ?5027 01/14/22 ?7412 01/15/22 ?8786 01/16/22 ?0604  ?NA 142 136 138 138 137  ?K 4.0 4.2 3.8 4.0 4.1  ?CL 106 103 102 104 103  ?CO2 28 27 28 29 29   ?GLUCOSE 140* 118* 128* 101* 107*  ?BUN 26* 17 23 15 20   ?CREATININE 3.78* 2.70* 3.69* 2.79* 3.35*  ?CALCIUM 8.2* 8.3* 8.4* 8.3* 8.6*  ?MG  --  1.7 2.1 1.9 2.0  ? ? ?Liver Function Tests: ?Recent Labs  ?Lab 01/11/22 ?1705  ?AST 13*  ?ALT 10  ?ALKPHOS 62  ?BILITOT 0.5   ?PROT 5.7*  ?ALBUMIN 2.8*  ? ?No results for input(s): LIPASE, AMYLASE in the last 168 hours. ?No results for input(s): AMMONIA in the last 168 hours. ? ?CBC: ?Recent Labs  ?Lab 01/12/22 ?7672 01/13/22 ?0947 01/14/22 ?0962 01/15/22 ?8366 01/16/22 ?0604  ?WBC 7.6 9.3 9.0 10.1 9.8  ?HGB 9.0* 9.0* 9.0* 8.3* 9.2*  ?HCT 28.7* 27.7* 29.1* 27.1* 29.8*  ?MCV 103.2* 100.0 104.3* 105.9* 107.2*  ?PLT 123* 122* 124* 137* 168  ? ? ?Cardiac Enzymes: ?No results for input(s): CKTOTAL, CKMB, CKMBINDEX, TROPONINI in the last 168 hours. ? ?BNP: ?Invalid input(s): POCBNP ? ?CBG: ?Recent Labs  ?Lab 01/16/22 ?0729 01/16/22 ?1156 01/16/22 ?1722 01/16/22 ?2058 01/17/22 ?0834  ?GLUCAP 112* 146* 128* 122* 134*  ? ? ?Microbiology: ?Results for orders placed or performed during the hospital encounter of 01/11/22  ?MRSA Next Gen by PCR, Nasal     Status: None  ? Collection Time: 01/12/22  6:23 PM  ? Specimen: Nasal Mucosa; Nasal Swab  ?Result Value Ref Range Status  ? MRSA by PCR Next Gen NOT DETECTED NOT DETECTED Final  ?  Comment: (NOTE) ?The GeneXpert MRSA Assay (FDA approved for NASAL specimens only), ?is one component of a comprehensive MRSA colonization surveillance ?program. It is not intended to diagnose MRSA infection nor to guide ?or monitor treatment for MRSA infections. ?Test performance is not FDA approved in patients  less than 2 years ?old. ?Performed at [facility], [place], ?[place] [address] ?  ? ? ?Coagulation Studies: ?No results for input(s): LABPROT, INR in the last 72 hours. ? ?Urinalysis: ?No results for input(s): COLORURINE, LABSPEC, [place], GLUCOSEU, HGBUR, BILIRUBINUR, KETONESUR, PROTEINUR, UROBILINOGEN, NITRITE, LEUKOCYTESUR in the last 72 hours. ? ?Invalid input(s): APPERANCEUR  ? ? ?Imaging: ?No results found. ? ? ?Medications:  ? ? sodium chloride Stopped (01/12/22 0243)  ? sodium chloride 10 mL/hr at 01/13/22 2309  ? ? aspirin EC  81 mg Oral Daily  ? carvedilol  3.125 mg Oral Q supper   ? carvedilol  3.125 mg Oral Once per day on Sun Tue Thu Sat  ? cefTRIAXone (ROCEPHIN) IM  1 g Intramuscular Q24H  ? epoetin (EPOGEN/PROCRIT) injection  10,000 Units Intravenous Q M,W,F-HD  ? FLUoxetine  40 mg Oral Daily  ? folic acid  1 mg Oral Daily  ? heparin injection (subcutaneous)  5,000 Units Subcutaneous Q8H  ? insulin aspart  0-15 Units Subcutaneous TID AC & HS  ? irbesartan  75 mg Oral Daily  ? isosorbide dinitrate  5 mg Oral QHS  ? levothyroxine  75 mcg Oral Q0600  ? mouth rinse  15 mL Mouth Rinse BID  ? midodrine  5 mg Oral Q M,W,F-HD  ? nystatin   Topical Q8H  ? pantoprazole sodium  40 mg Oral BID  ? polyethylene glycol  17 g Oral Daily  ? rosuvastatin  10 mg Oral Daily  ? ?sodium chloride, acetaminophen **OR** acetaminophen, albuterol, clonazePAM, lidocaine-prilocaine, magnesium hydroxide, nystatin, ondansetron **OR** ondansetron (ZOFRAN) IV, oxybutynin, senna-docusate, traZODone ? ?Assessment/ Plan:  ?86 y.o. female with a past medical history of end-stage renal disease on hemodialysis on Monday Wednesday Friday schedule, diastolic CHF, COPD, coronary artery disease, diabetes mellitus, hypertension, hyperlipidemia, hypothyroidism admitted with the chief complaints of weakness and altered mental status.  She is currently undergoing treatment for metabolic encephalopathy and UTI secondary to E. Coli.  Patient is known to our clinic and receives outpatient dialysis at [facility] on Monday Wednesday Friday schedule under Dr. Candiss Norse. ? ?End-stage renal disease, on dialysis Monday Wednesday Friday schedule.  Patient is currently undergoing hemodialysis treatment.  Could not meet the UF goal of 2.5 L due to hypotension.  Continue midodrine.  ?Hypotension.  Continue midodrine and monitor blood pressure closely ?Anemia of chronic disease.  Monitor H&H closely. Latest hemoglobin 9.2. ?Secondary hyperparathyroidism/hyperphosphatemia.  Continue to monitor phosphorus level. ?Metabolic encephalopathy  most likely infectious etiology secondary to UTI.  CT head negative for acute pathology. Continue IV antibiotics per primary care team.  ? ? ? ? ? ? LOS: 5 ?[place] ?5/15/20231:54 PM ?  ?

## 2022-01-17 NOTE — Progress Notes (Signed)
Hemodialysis Post Treatment Note ? ?17 Jan 2022 ? ?Access: ?LFA AVF ? ?Treatment Time: ?3-Hours ? ?UF Removed: ?841 ml  ? ?Next Scheduled Treatment: ?01/19/2022 ? ?Note:  ? ?Patient presents for hemodialysis treatment, quite anxious, very verbal, she consistently yelled throughout her treatment. LFA AVF intact, no difficulty in cannulation, however, patient movement caused displaced of needles, therefore, an increase in venous and arterial pressures causing the BFR to be reduced during treatment. Patient was ultimately given PRN dose of Clonazepam without significant relief, patient continued to display anxious behaviors, calling out for help, requesting staff to come sit with her. ?Subsequently, patient experienced hypotensive episodes during treatment, treated with NS boluses, and holding targeted UF. Post treatment bleeding from AVF was minimal. Patient transported to assigned room, report given to primary nurse.  ?

## 2022-01-18 DIAGNOSIS — Z7189 Other specified counseling: Secondary | ICD-10-CM | POA: Diagnosis not present

## 2022-01-18 DIAGNOSIS — G9341 Metabolic encephalopathy: Secondary | ICD-10-CM | POA: Diagnosis not present

## 2022-01-18 LAB — BASIC METABOLIC PANEL
Anion gap: 8 (ref 5–15)
BUN: 14 mg/dL (ref 8–23)
CO2: 29 mmol/L (ref 22–32)
Calcium: 8.3 mg/dL — ABNORMAL LOW (ref 8.9–10.3)
Chloride: 96 mmol/L — ABNORMAL LOW (ref 98–111)
Creatinine, Ser: 2.59 mg/dL — ABNORMAL HIGH (ref 0.44–1.00)
GFR, Estimated: 18 mL/min — ABNORMAL LOW (ref >60)
Glucose, Bld: 94 mg/dL (ref 70–99)
Potassium: 3.9 mmol/L (ref 3.5–5.1)
Sodium: 133 mmol/L — ABNORMAL LOW (ref 135–145)

## 2022-01-18 LAB — GLUCOSE, CAPILLARY
Glucose-Capillary: 106 mg/dL — ABNORMAL HIGH (ref 70–99)
Glucose-Capillary: 121 mg/dL — ABNORMAL HIGH (ref 70–99)
Glucose-Capillary: 104 mg/dL — ABNORMAL HIGH (ref 70–99)
Glucose-Capillary: 114 mg/dL — ABNORMAL HIGH (ref 70–99)

## 2022-01-18 LAB — PHOSPHORUS: Phosphorus: 3.6 mg/dL (ref 2.5–4.6)

## 2022-01-18 LAB — CBC
HCT: 32.2 % — ABNORMAL LOW (ref 36.0–46.0)
Hemoglobin: 9.8 g/dL — ABNORMAL LOW (ref 12.0–15.0)
MCH: 32.3 pg (ref 26.0–34.0)
MCHC: 30.4 g/dL (ref 30.0–36.0)
MCV: 106.3 fL — ABNORMAL HIGH (ref 80.0–100.0)
Platelets: 144 10*3/uL — ABNORMAL LOW (ref 150–400)
RBC: 3.03 MIL/uL — ABNORMAL LOW (ref 3.87–5.11)
RDW: 18.7 % — ABNORMAL HIGH (ref 11.5–15.5)
WBC: 9.7 10*3/uL (ref 4.0–10.5)
nRBC: 0.2 % (ref 0.0–0.2)

## 2022-01-18 LAB — MAGNESIUM: Magnesium: 2 mg/dL (ref 1.7–2.4)

## 2022-01-18 NOTE — Progress Notes (Signed)
Physical Therapy Treatment ?Patient Details ?Name: Dawn Bradford ?MRN: [id number] ?DOB: 06/08/1936 ?Today's Date: 01/18/2022 ? ? ?History of Present Illness Pt is an 86 year old with history of ESRD on HD MWF, diastolic CHF, COPD, CAD, DM 2, HTN, HLD, hypothyroidism admitted for weakness and altered mental status worsening over the past 8 days.  Recently had her AV fistula decannulated with some drop in hemoglobin.  She also saw outpatient neurology where she was diagnosed with possible UTI. ? ?  ?PT Comments  ? ? Pt is making limited progress towards goals, still very weak in B LE. Able to stand with +2 assist for bed linen change. Does report pain with mobility. Incontient BM, total assist for hygiene. All mobility performed on 2L of O2 with sats at 98%. Able to perform B LE there-ex and follows commands well. Will continue to progress.   ?Recommendations for follow up therapy are one component of a multi-disciplinary discharge planning process, led by the attending physician.  Recommendations may be updated based on patient status, additional functional criteria and insurance authorization. ? ?Follow Up Recommendations ? Home health PT ?  ?  ?Assistance Recommended at Discharge Frequent or constant Supervision/Assistance  ?Patient can return home with the following Two people to help with walking and/or transfers;A lot of help with bathing/dressing/bathroom;Assistance with feeding;Assist for transportation;Assistance with cooking/housework;Direct supervision/assist for financial management;Help with stairs or ramp for entrance;Direct supervision/assist for medications management ?  ?Equipment Recommendations ? Other (comment) (hoyer lift)  ?  ?Recommendations for Other Services   ? ? ?  ?Precautions / Restrictions Precautions ?Precautions: Fall ?Restrictions ?Weight Bearing Restrictions: No  ?  ? ?Mobility ? Bed Mobility ?Overal bed mobility: Needs Assistance ?Bed Mobility: Rolling, Supine to Sit, Sit to  Supine ?Rolling: Max assist ?Sidelying to sit: Max assist, +2 for physical assistance ?  ?Sit to supine: Max assist, +2 for physical assistance ?  ?General bed mobility comments: once seated, able to progress to sitting with cga. Needs cues for initiation ?  ? ?Transfers ?Overall transfer level: Needs assistance ?Equipment used: 2 person hand held assist ?Transfers: Sit to/from Stand, Bed to chair/wheelchair/BSC ?Sit to Stand: Total assist, +2 physical assistance ?  ?  ?  ?  ? Lateral/Scoot Transfers: Total assist, +2 physical assistance ?General transfer comment: needs cues for initiation. Chux pad assist ?  ? ?Ambulation/Gait ?  ?  ?  ?  ?  ?  ?  ?General Gait Details: unable to amb at baseline ? ? ?Stairs ?  ?  ?  ?  ?  ? ? ?Wheelchair Mobility ?  ? ?Modified Rankin (Stroke Patients Only) ?  ? ? ?  ?Balance Overall balance assessment: Needs assistance ?Sitting-balance support: Single extremity supported, Feet supported ?Sitting balance-Leahy Scale: Fair ?  ?  ?Standing balance support: Bilateral upper extremity supported ?Standing balance-Leahy Scale: Zero ?  ?  ?  ?  ?  ?  ?  ?  ?  ?  ?  ?  ?  ? ?  ?Cognition Arousal/Alertness: Awake/alert ?Behavior During Therapy: Flat affect ?Overall Cognitive Status: History of cognitive impairments - at baseline ?  ?  ?  ?  ?  ?  ?  ?  ?  ?  ?  ?  ?  ?  ?  ?  ?General Comments: states name, unable to state location, folllows commands with time and cues ?  ?  ? ?  ?Exercises Other Exercises ?Other Exercises: supine/seated ther-ex performed on B LE including  LAQ, SLRs, knee flexion, and hip abd/add. 10 reps with mod assist ?Other Exercises: rolling performed for bed linen change and pericare as pt had incontient BM upon arrival ? ?  ?General Comments General comments (skin integrity, edema, etc.): SpO2 99% on 2L [place] ?  ?  ? ?Pertinent Vitals/Pain Pain Assessment ?Pain Assessment: Faces ?Faces Pain Scale: Hurts a little bit ?Pain Location: pericare ?Pain Descriptors /  Indicators: Discomfort, Dull ?Pain Intervention(s): Limited activity within patient's tolerance  ? ? ?Home Living   ?  ?  ?  ?  ?  ?  ?  ?  ?  ?   ?  ?Prior Function    ?  ?  ?   ? ?PT Goals (current goals can now be found in the care plan section) Acute Rehab PT Goals ?Patient Stated Goal: to regain her strength as able ?PT Goal Formulation: With family ?Time For Goal Achievement: 01/27/22 ?Potential to Achieve Goals: Fair ?Progress towards PT goals: Progressing toward goals ? ?  ?Frequency ? ? ? Min 2X/week ? ? ? ?  ?PT Plan Current plan remains appropriate  ? ? ?Co-evaluation PT/OT/SLP Co-Evaluation/Treatment: Yes ?Reason for Co-Treatment: Necessary to address cognition/behavior during functional activity;For patient/therapist safety;To address functional/ADL transfers ?PT goals addressed during session: Mobility/safety with mobility;Strengthening/ROM ?OT goals addressed during session: ADL's and self-care ?  ? ?  ?AM-PAC PT "6 Clicks" Mobility   ?Outcome Measure ? Help needed turning from your back to your side while in a flat bed without using bedrails?: A Lot ?Help needed moving from lying on your back to sitting on the side of a flat bed without using bedrails?: A Lot ?Help needed moving to and from a bed to a chair (including a wheelchair)?: Total ?Help needed standing up from a chair using your arms (e.g., wheelchair or bedside chair)?: Total ?Help needed to walk in hospital room?: Total ?Help needed climbing 3-5 steps with a railing? : Total ?6 Click Score: 8 ? ?  ?End of Session Equipment Utilized During Treatment: Gait belt ?Activity Tolerance: Patient tolerated treatment well ?Patient left: in bed;with bed alarm set;with nursing/sitter in room ?Nurse Communication: Mobility status ?PT Visit Diagnosis: Other abnormalities of gait and mobility (R26.89) ?  ? ? ?Time: 8325-4982 ?PT Time Calculation (min) (ACUTE ONLY): 28 min ? ?Charges:  $Therapeutic Exercise: 8-22 mins          ?          ? ?Greggory Stallion,  PT, DPT, GCS ?[phone] ? ? ? ?Dawn Bradford ?01/18/2022, 4:56 PM ? ?

## 2022-01-18 NOTE — Progress Notes (Signed)
?[facility]  ?ROUNDING NOTE  ? ?Subjective:  ?Patient is a 86 year old female with history of ESRD on hemodialysis Monday Wednesday Friday, diastolic CHF, COPD, CAD, DM 2, hypertension, hyperlipidemia, hypothyroidism admitted for generalized weakness and altered mental status.  ? ?Patient was seen and examined at bedside. She is awake, resting comfortably in the bed appears to have intermittent confusion. Caregiver stated that she has poor p.o. intake.  ? ?Objective:  ?Vital signs in last 24 hours:  ?Temp:  [97.8 ?F (36.6 ?C)-98.6 ?F (37 ?C)] 97.8 ?F (36.6 ?C) (05/16 0805) ?Pulse Rate:  [56-135] 56 (05/16 0805) ?Resp:  [12-26] 20 (05/16 0524) ?BP: (78-153)/(35-77) 153/55 (05/16 0805) ?SpO2:  [96 %-100 %] 96 % (05/16 0805) ?Weight:  [76.3 kg] 76.3 kg (05/15 1301) ? ?Weight change:  ?Filed Weights  ? 01/17/22 0943 01/17/22 0953 01/17/22 1301  ?Weight: 76.7 kg 76.7 kg 76.3 kg  ? ? ?Intake/Output: ?I/O last 3 completed shifts: ?In: -  ?Out: 841 [Other:841] ?  ?Intake/Output this shift: ? No intake/output data recorded. ? ?Physical Exam: ?General: No acute distress  ?Head: Normocephalic, atraumatic. Moist oral mucosal membranes  ?Eyes: Anicteric  ?Neck: Supple  ?Lungs:  Clear to auscultation, normal effort  ?Heart: S1S2 no rubs  ?Abdomen:  Soft, nontender, bowel sounds present  ?Extremities:  Trace edema, no cynosis.  ?Neurologic: Awake, alert, following commands  ?Skin: No acute rash  ?Access: LUE AVF  ? ? ?Basic Metabolic Panel: ?Recent Labs  ?Lab 01/14/22 ?0626 01/15/22 ?7564 01/16/22 ?0604 01/17/22 ?1753 01/18/22 ?0542  ?NA 138 138 137 134* 133*  ?K 3.8 4.0 4.1 3.6 3.9  ?CL 102 104 103 97* 96*  ?CO2 28 29 29 30 29   ?GLUCOSE 128* 101* 107* 120* 94  ?BUN 23 15 20 13 14   ?CREATININE 3.69* 2.79* 3.35* 2.26* 2.59*  ?CALCIUM 8.4* 8.3* 8.6* 8.1* 8.3*  ?MG 2.1 1.9 2.0 1.7 2.0  ?PHOS  --   --   --  2.7 3.6  ? ? ? ?Liver Function Tests: ?Recent Labs  ?Lab 01/11/22 ?1705  ?AST 13*  ?ALT 10  ?ALKPHOS 62  ?BILITOT  0.5  ?PROT 5.7*  ?ALBUMIN 2.8*  ? ? ?No results for input(s): LIPASE, AMYLASE in the last 168 hours. ?No results for input(s): AMMONIA in the last 168 hours. ? ?CBC: ?Recent Labs  ?Lab 01/14/22 ?0626 01/15/22 ?3329 01/16/22 ?0604 01/17/22 ?1753 01/18/22 ?0542  ?WBC 9.0 10.1 9.8 9.8 9.7  ?HGB 9.0* 8.3* 9.2* 8.5* 9.8*  ?HCT 29.1* 27.1* 29.8* 27.5* 32.2*  ?MCV 104.3* 105.9* 107.2* 103.8* 106.3*  ?PLT 124* 137* 168 135* 144*  ? ? ? ?Cardiac Enzymes: ?No results for input(s): CKTOTAL, CKMB, CKMBINDEX, TROPONINI in the last 168 hours. ? ?BNP: ?Invalid input(s): POCBNP ? ?CBG: ?Recent Labs  ?Lab 01/17/22 ?5188 01/17/22 ?1439 01/17/22 ?1649 01/17/22 ?2047 01/18/22 ?0803  ?GLUCAP 134* 106* 141* 90 106*  ? ? ? ?Microbiology: ?Results for orders placed or performed during the hospital encounter of 01/11/22  ?MRSA Next Gen by PCR, Nasal     Status: None  ? Collection Time: 01/12/22  6:23 PM  ? Specimen: Nasal Mucosa; Nasal Swab  ?Result Value Ref Range Status  ? MRSA by PCR Next Gen NOT DETECTED NOT DETECTED Final  ?  Comment: (NOTE) ?The GeneXpert MRSA Assay (FDA approved for NASAL specimens only), ?is one component of a comprehensive MRSA colonization surveillance ?program. It is not intended to diagnose MRSA infection nor to guide ?or monitor treatment for MRSA infections. ?Test performance  is not FDA approved in patients less than 2 years ?old. ?Performed at [facility], [place], ?[place] [address] ?  ? ? ?Coagulation Studies: ?No results for input(s): LABPROT, INR in the last 72 hours. ? ?Urinalysis: ?No results for input(s): COLORURINE, LABSPEC, [place], GLUCOSEU, HGBUR, BILIRUBINUR, KETONESUR, PROTEINUR, UROBILINOGEN, NITRITE, LEUKOCYTESUR in the last 72 hours. ? ?Invalid input(s): APPERANCEUR  ? ? ?Imaging: ?No results found. ? ? ?Medications:  ? ? sodium chloride Stopped (01/12/22 0243)  ? sodium chloride 10 mL/hr at 01/13/22 2309  ? ? amoxicillin-clavulanate  1 tablet Oral Daily  ? And  ?  [START ON 01/19/2022] amoxicillin-clavulanate  1 tablet Oral Once per day on Mon Wed Fri  ? aspirin EC  81 mg Oral Daily  ? carvedilol  3.125 mg Oral Q supper  ? carvedilol  3.125 mg Oral Once per day on Sun Tue Thu Sat  ? epoetin (EPOGEN/PROCRIT) injection  10,000 Units Intravenous Q M,W,F-HD  ? FLUoxetine  40 mg Oral Daily  ? folic acid  1 mg Oral Daily  ? heparin injection (subcutaneous)  5,000 Units Subcutaneous Q8H  ? insulin aspart  0-15 Units Subcutaneous TID AC & HS  ? irbesartan  75 mg Oral Daily  ? isosorbide dinitrate  5 mg Oral QHS  ? levothyroxine  75 mcg Oral Q0600  ? mouth rinse  15 mL Mouth Rinse BID  ? midodrine  5 mg Oral Q M,W,F-HD  ? nystatin   Topical Q8H  ? pantoprazole sodium  40 mg Oral BID  ? polyethylene glycol  17 g Oral Daily  ? rosuvastatin  10 mg Oral Daily  ? ?sodium chloride, acetaminophen **OR** acetaminophen, albuterol, clonazePAM, lidocaine-prilocaine, magnesium hydroxide, nystatin, ondansetron **OR** ondansetron (ZOFRAN) IV, oxybutynin, senna-docusate, traZODone ? ?Assessment/ Plan:  ?86 y.o. female with a past medical history of end-stage renal disease on hemodialysis on Monday Wednesday Friday schedule, diastolic CHF, COPD, coronary artery disease, diabetes mellitus, hypertension, hyperlipidemia, hypothyroidism admitted for weakness and altered mental status. She is currently undergoing treatment for metabolic encephalopathy and UTI secondary to E. Coli. Her mental status still waxing and waning.  ? ?End-stage renal disease, on dialysis Monday Wednesday Friday schedule.  Patient received hemodialysis treatment yesterday. She will have another treatment tomorrow.  ?Hypotension.On midodrine.  Blood pressure noted to be improving.  Status post dosage adjustment for Coreg Avapro and Imdur by primary care team. Monitor closely. ?Anemia of chronic disease.  Monitor H&H closely. Latest hemoglobin 9.8. ?Secondary hyperparathyroidism/hyperphosphatemia.  Continue to monitor phosphorus  level.  Latest phosphorus 3.6. ?Metabolic encephalopathy most likely infectious etiology secondary to UTI.  CT head negative for any acute pathology. Continue IV antibiotics per primary care team.  ? ? ? ? ? ? LOS: 6 ?[place] ?5/16/202310:54 AM ?  ?

## 2022-01-18 NOTE — Plan of Care (Addendum)
Full note to follow. Family meeting tomorrow at 12:15.  ?

## 2022-01-18 NOTE — Progress Notes (Signed)
?PROGRESS NOTE ? ? ? ?Dawn Bradford  GBT:[id number] DOB: 1936-01-19 DOA: 01/11/2022 ?PCP: Pcp, No  ? ? ?Brief Narrative:  ?This 86 year old female with history of ESRD on HD MWF, diastolic CHF, COPD, CAD, DM 2, HTN, HLD, hypothyroidism admitted for  generalized weakness and altered mental status worsening over the past 8 days.  Recently had her AV fistula decannulated with some drop in hemoglobin.  She also saw outpatient neurology where she was diagnosed with possible UTI and was prescribed Cipro but had not taken it.  Upon admission her hemoglobin was 6.7, EKG showed A-fib.  CT head was negative for acute pathology but showed chronic sinusitis, CT abdomen pelvis showed cardiomegaly with bilateral pleural effusion, 2.2 cm cystic pancreatic lesion.  ?She is admitted for metabolic encephalopathy secondary to UTI  and she is started on IV Rocephin.  Nephrology is consulted for continuation of hemodialysis.  Palliative care consulted to discuss goals of care. ? ?Assessment & Plan: ?  ?Principal Problem: ?  Metabolic encephalopathy ?Active Problems: ?  Acute lower UTI ?  End-stage renal disease on hemodialysis ([place]) ?  Iron deficiency anemia due to chronic blood loss ?  Essential hypertension ?  Acquired hypothyroidism ?  Anxiety and depression ?  Type 2 diabetes mellitus with chronic kidney disease, with long-term current use of insulin ([place]) ? ?Metabolic Encephalopathy: ?UTI secondary to E. coli. ?Outpatient urine culture grew E. coli. Initiated  IV ceftriaxone. ?She became lethargic after HD on 01/14/22 ?CT of the head is negative for acute pathology.  No focal neurodeficits.   ?Continue to monitor neurochecks. ?ABGs reviewed, no significant CO2 retention,  ?Antibiotics changed to Augmentin. ? ?Iron deficiency anemia: ?Likely Chronic blood loss and macrocytosis. ?She has recent decannulation of her AV fistula.  Hemoccult is negative.   ?Status post 1 unit PRBC on 5/10, hemoglobin 9.0 >9.8 ?Folate deficiency, continue  folic acid supplement. ?  ?End-stage renal disease on hemodialysis ([place]) ?Nephrology consulted.  Status post HD 5/10.  EPO increased. ?Had HD on 01/17/22. Next hemodialysis 01/19/2022. ?Nephrology has already discussed with family about poor quality of life having dialysis. ?Palliative care consulted to discuss goals of care /continuation of dialysis. ?  ?History of CAD: s/p CABG ?History of CVA ?Continue aspirin and Crestor ?Continue Coreg, Avapro, isosorbide ?  ?Type 2 diabetes mellitus with chronic kidney disease, with long-term current use of insulin ([place]) ?Sliding scale and Accu-Cheks. A1c = 5.7 ?  ?Anxiety and depression ?Continue Prozac and as needed Klonopin. ?  ?Acquired hypothyroidism ?Continue Synthroid. ?  ?Essential hypertension ?Currently on Coreg, Avapro, isosorbide.  Also gets midodrine prior to HD. ?Decreased dose of Coreg with holding parameters, use holding parameters for Avapro and Imdur ?  ?  ?Sacral decubitus ulcer stage II-present POA ?Turn patient every 2 hourly and continue fall precautions. ? ? ?DVT prophylaxis: SCDs ?Code Status: DNR ?Family Communication:  Daughter At bedside. ?Disposition Plan: ?Patient was very lethargic after hemodialysis on 5/12, unsafe DC. ?TOC consulted hospice team for hospice services at home as per family request. ?If no hemodialysis patient can be discharged home with hospice.  arrangement to be done. ? ?Palliative care meeting with family 01/18/2022. ? ?Consultants:  ?Nephrology ?Palliative care ? ?Procedures: Scheduled hemodialysis. ?Antimicrobials:   ?Anti-infectives (From admission, onward)  ? ? Start     Dose/Rate Route Frequency Ordered Stop  ? 01/19/22 1500  amoxicillin-clavulanate (AUGMENTIN) 500-125 MG per tablet 500 mg       ?See Hyperspace for full Linked Orders Report.  ?  1 tablet Oral Once per day on Mon Wed Fri 01/17/22 1424    ? 01/18/22 1000  amoxicillin-clavulanate (AUGMENTIN) 500-125 MG per tablet 500 mg       ?See Hyperspace for full Linked  Orders Report.  ? 1 tablet Oral Daily 01/17/22 1424    ? 01/15/22 0015  cefTRIAXone (ROCEPHIN) injection 1 g  Status:  Discontinued       ? 1 g Intramuscular Every 24 hours 01/14/22 2329 01/17/22 1420  ? 01/13/22 0000  cefTRIAXone (ROCEPHIN) 1 g in sodium chloride 0.9 % 100 mL IVPB  Status:  Discontinued       ? 1 g ?200 mL/hr over 30 Minutes Intravenous Every 24 hours 01/12/22 0153 01/14/22 2329  ? 01/11/22 2215  cefTRIAXone (ROCEPHIN) 1 g in sodium chloride 0.9 % 100 mL IVPB       ? 1 g ?200 mL/hr over 30 Minutes Intravenous  Once 01/11/22 2201 01/12/22 0112  ? ?  ?  ? ?Subjective: ?Patient was seen and examined at bedside.  No overnight events.   ?She appears confused, deconditioned but following commands. ?She had hemodialysis yesterday, tolerated well,  required midodrine. ?She is resting comfortably now,  denies any symptoms. ? ?Objective: ?Vitals:  ? 01/17/22 1404 01/17/22 1940 01/18/22 0524 01/18/22 0805  ?BP: 132/73 (!) 101/52 131/73 (!) 153/55  ?Pulse: (!) 101 84 88 (!) 56  ?Resp: 16 16 20    ?Temp: 97.9 ?F (36.6 ?C) 98 ?F (36.7 ?C) 98.6 ?F (37 ?C) 97.8 ?F (36.6 ?C)  ?TempSrc:      ?SpO2: 100% 99% 99% 96%  ?Weight:      ?Height:      ? ? ?Intake/Output Summary (Last 24 hours) at 01/18/2022 1203 ?Last data filed at 01/17/2022 1301 ?Gross per 24 hour  ?Intake --  ?Output 841 ml  ?Net -841 ml  ? ?Filed Weights  ? 01/17/22 0943 01/17/22 0953 01/17/22 1301  ?Weight: 76.7 kg 76.7 kg 76.3 kg  ? ? ?Examination: ? ?General exam: Appears comfortable, deconditioned, not in any distress. ?Respiratory system: CTA bilaterally, no wheezing, no crackles, respiratory effort normal. ?Cardiovascular system: S1 & S2 heard, regular rate and rhythm, no murmur. ?Gastrointestinal system: Abdomen is soft, non tender, non distended, BS+ ?Central nervous system: Alert and oriented x 1. No focal neurological deficits. ?Extremities: No edema, no cyanosis, no clubbing. ?Skin: No rashes, lesions or ulcers ?Psychiatry: Confused, pleasantly  demented at baseline, non combative. ? ? ? ?Data Reviewed: I have personally reviewed following labs and imaging studies ? ?CBC: ?Recent Labs  ?Lab 01/14/22 ?0626 01/15/22 ?3568 01/16/22 ?0604 01/17/22 ?1753 01/18/22 ?0542  ?WBC 9.0 10.1 9.8 9.8 9.7  ?HGB 9.0* 8.3* 9.2* 8.5* 9.8*  ?HCT 29.1* 27.1* 29.8* 27.5* 32.2*  ?MCV 104.3* 105.9* 107.2* 103.8* 106.3*  ?PLT 124* 137* 168 135* 144*  ? ?Basic Metabolic Panel: ?Recent Labs  ?Lab 01/14/22 ?0626 01/15/22 ?6168 01/16/22 ?0604 01/17/22 ?1753 01/18/22 ?0542  ?NA 138 138 137 134* 133*  ?K 3.8 4.0 4.1 3.6 3.9  ?CL 102 104 103 97* 96*  ?CO2 28 29 29 30 29   ?GLUCOSE 128* 101* 107* 120* 94  ?BUN 23 15 20 13 14   ?CREATININE 3.69* 2.79* 3.35* 2.26* 2.59*  ?CALCIUM 8.4* 8.3* 8.6* 8.1* 8.3*  ?MG 2.1 1.9 2.0 1.7 2.0  ?PHOS  --   --   --  2.7 3.6  ? ?GFR: ?Estimated Creatinine Clearance: 16.5 mL/min (A) (by C-G formula based on SCr of 2.59 mg/dL (H)). ?Liver Function  Tests: ?Recent Labs  ?Lab 01/11/22 ?1705  ?AST 13*  ?ALT 10  ?ALKPHOS 62  ?BILITOT 0.5  ?PROT 5.7*  ?ALBUMIN 2.8*  ? ?No results for input(s): LIPASE, AMYLASE in the last 168 hours. ?No results for input(s): AMMONIA in the last 168 hours. ?Coagulation Profile: ?No results for input(s): INR, PROTIME in the last 168 hours. ?Cardiac Enzymes: ?No results for input(s): CKTOTAL, CKMB, CKMBINDEX, TROPONINI in the last 168 hours. ?BNP (last 3 results) ?No results for input(s): PROBNP in the last 8760 hours. ?HbA1C: ?No results for input(s): HGBA1C in the last 72 hours. ?CBG: ?Recent Labs  ?Lab 01/17/22 ?1439 01/17/22 ?1649 01/17/22 ?2047 01/18/22 ?0803 01/18/22 ?1127  ?GLUCAP 106* 141* 90 106* 121*  ? ?Lipid Profile: ?No results for input(s): CHOL, HDL, LDLCALC, TRIG, CHOLHDL, LDLDIRECT in the last 72 hours. ?Thyroid Function Tests: ?No results for input(s): TSH, T4TOTAL, FREET4, T3FREE, THYROIDAB in the last 72 hours. ?Anemia Panel: ?No results for input(s): VITAMINB12, FOLATE, FERRITIN, TIBC, IRON, RETICCTPCT in the last 72  hours. ? ?Sepsis Labs: ?Recent Labs  ?Lab 01/11/22 ?2209 01/12/22 ?0913  ?LATICACIDVEN 0.9 1.2  ? ? ?Recent Results (from the past 240 hour(s))  ?MRSA Next Gen by PCR, Nasal     Status: None  ? Collection [employer]

## 2022-01-18 NOTE — Consult Note (Signed)
Consultation Note Date: 01/18/2022   Patient Name: BASIA MCGINTY  DOB: Jun 08, 1936  MRN: [id number]  Age / Sex: 86 y.o., female  PCP: Pcp, No Referring Physician: Shawna Clamp, MD  Reason for Consultation: Establishing goals of care  HPI/Patient Profile: This 86 year old female with history of ESRD on HD MWF, diastolic CHF, COPD, CAD, DM 2, HTN, HLD, hypothyroidism admitted for  generalized weakness and altered mental status worsening over the past 8 days.  Recently had her AV fistula decannulated with some drop in hemoglobin.  She also saw outpatient neurology where she was diagnosed with possible UTI and was prescribed Cipro but had not taken it.  Upon admission her hemoglobin was 6.7, EKG showed A-fib.  CT head was negative for acute pathology but showed chronic sinusitis, CT abdomen pelvis showed cardiomegaly with bilateral pleural effusion, 2.2 cm cystic pancreatic lesion.   Clinical Assessment and Goals of Care: Noes and labs reviewed. Patient is resting in bed. She says "hey dear" but nothing more. Home private duty sitter is at bedside. No family at bedside.   Sitter shares the patient and husband lived together and had a paid caregiver in the home for the two of them. He died a year ago, and a caregiver remained for her. She states she has been working with her since January.   She states at baseline, patient is incontinent. She requires help with all ADL's. She has declined physically, to a place of needing transfer equipment as she does not take more than a few steps without wanting to sit down wherever she is. She discusses her dysphagia diet which she has done well with. Cognitively, she states patient has very short windows of lucidity, and describes it as literally a few sentences that make sense to her, and then the lucidity is gone. She states patient cannot verbalize needs and frequently calls out to  [place].   Spoke with daughter [name]. She states she and her sisters are all shared decision makers. She shares that they are not fully in agreement on a plan as of this time. Discussed a family meeting at 12:15.      SUMMARY OF RECOMMENDATIONS   Family meeting tomorrow at 12:15.        Primary Diagnoses: Present on Admission:  Metabolic encephalopathy  Acquired hypothyroidism  Essential hypertension   I have reviewed the medical record, interviewed the patient and family, and examined the patient. The following aspects are pertinent.  Past Medical History:  Diagnosis Date   ACE-inhibitor cough    Aortic valve disorder    Echo in 2009 showed mean aortic valve gradient of 13 mmHg, suggesting very mild stenosis. Echo (12/10) suggested aortic sclerosis only   Carotid artery disease (HCC)    mild, carotid dopplers 12/2009   CHF (congestive heart failure) (HCC)    COPD (chronic obstructive pulmonary disease) (HCC)    Coronary artery disease    s/p anterior MI in 1996 followed by CABG. Lexiscan myoview (4/11): EF 67%, normal perfusion with no evidence for  ischemia or infarction.    Diabetes mellitus    type 2   Diastolic heart failure    most recent echo (12/10) showed EF 55-60% with mild LVH, grade I diastolic dysfunction, mild LAE.   ESRD (end stage renal disease) on dialysis ([place])    Hyperlipidemia    Hypertension    resistant hyptertension times many years. the patient does have renal artery stenosis. she has tried calcium channel blockers in the past and states that she would not take them now because she had some problems with her gums which her dentist identified as calcium-channel blocker side effects.   Hypothyroidism    Mild asthma    PFT 02/05/10 FEV1 1.38, FEV1% 73, TLC 3.78 (86%), DLCO 48%, +BD   Myocardial infarction (HCC)    Obesity    Obesity hypoventilation syndrome ([place])        Obstructive sleep apnea    PSG 01/05/2006 AHI 24.8, CPAP 9cm H2O   Pulmonary nodule,  right    Renal artery stenosis ([place])    The patient has an occluded right renal artery and an atrophic right kidney. There is 20% left renal artery stenosis. This was seen by catheterization in 2007   Stroke [facility])    Social History   Socioeconomic History   Marital status: Widowed    Spouse name: Not on file   Number of children: Not on file   Years of education: Not on file   Highest education level: Not on file  Occupational History   Not on file  Tobacco Use   Smoking status: Never   Smokeless tobacco: Never  Vaping Use   Vaping Use: Never used  Substance and Sexual Activity   Alcohol use: No   Drug use: No   Sexual activity: Not Currently  Other Topics Concern   Not on file  Social History Narrative   Lives at home with husband and caregiver Product/process development scientist)   Social Determinants of Health   Financial Resource Strain: Not on file  Food Insecurity: Not on file  Transportation Needs: Not on file  Physical Activity: Not on file  Stress: Not on file  Social Connections: Not on file   Family History  Problem Relation Age of Onset   Kidney failure Mother    Diabetes Mother    Aortic aneurysm Father    Coronary artery disease Father    Heart attack Father    Diabetes Sister    Diabetes Brother    Kidney failure Brother    Scheduled Meds:  amoxicillin-clavulanate  1 tablet Oral Daily   And   [START ON 01/19/2022] amoxicillin-clavulanate  1 tablet Oral Once per day on Mon Wed Fri   aspirin EC  81 mg Oral Daily   carvedilol  3.125 mg Oral Q supper   carvedilol  3.125 mg Oral Once per day on Sun Tue Thu Sat   epoetin (EPOGEN/PROCRIT) injection  10,000 Units Intravenous Q M,W,F-HD   FLUoxetine  40 mg Oral Daily   folic acid  1 mg Oral Daily   heparin injection (subcutaneous)  5,000 Units Subcutaneous Q8H   insulin aspart  0-15 Units Subcutaneous TID AC & HS   irbesartan  75 mg Oral Daily   isosorbide dinitrate  5 mg Oral QHS   levothyroxine  75 mcg Oral Q0600   mouth  rinse  15 mL Mouth Rinse BID   midodrine  5 mg Oral Q M,W,F-HD   nystatin   Topical Q8H   pantoprazole sodium  40 mg Oral BID   polyethylene glycol  17 g Oral Daily   rosuvastatin  10 mg Oral Daily   Continuous Infusions:  sodium chloride Stopped (01/12/22 0243)   sodium chloride 10 mL/hr at 01/13/22 2309   PRN Meds:.sodium chloride, acetaminophen **OR** acetaminophen, albuterol, clonazePAM, lidocaine-prilocaine, magnesium hydroxide, nystatin, ondansetron **OR** ondansetron (ZOFRAN) IV, oxybutynin, senna-docusate, traZODone Medications Prior to Admission:  Prior to Admission medications   Medication Sig Start Date End Date Taking? Authorizing Provider  acetaminophen (TYLENOL) 325 MG tablet Take 2 tablets (650 mg total) by mouth every 6 (six) hours as needed for mild pain, fever or headache (or Fever >/= 101). 08/21/18  Yes Gladstone Lighter, MD  albuterol (PROVENTIL HFA;VENTOLIN HFA) 108 (90 Base) MCG/ACT inhaler Inhale 2 puffs into the lungs every 6 (six) hours as needed for wheezing or shortness of breath. 08/21/18  Yes [provider]  aspirin EC 81 MG tablet Take 81 mg by mouth daily. 02/18/21  Yes [provider]  carvedilol (COREG) 25 MG tablet Take 25 mg by mouth 2 (two) times daily. Patient is to skip morning dose on Monday, Wednesday and Friday 02/18/21  Yes [provider]  ciprofloxacin (CIPRO) 250 MG tablet Take 1 tablet (250 mg total) by mouth daily for 7 days. 01/11/22 01/18/22 Yes Vaillancourt, Aldona Bar, PA-C  clonazePAM (KLONOPIN) 0.5 MG tablet Take 0.5 mg by mouth 2 (two) times daily as needed for anxiety.   Yes [provider]  FLUoxetine (PROZAC) 20 MG capsule Take 40 mg by mouth daily. 03/06/20  Yes [provider]  furosemide (LASIX) 80 MG tablet Take 1 tablet by mouth every Tuesday, Thursday, Saturday, and Sunday. Takes only on non-dialysis days 10/18/21  Yes [provider]  hyoscyamine (ANASPAZ) 0.125 MG TBDP  disintergrating tablet Take 0.125 mg by mouth every 4 (four) hours. 02/18/21  Yes [provider]  [facility] EX) Apply liberal amount topically to area of skin irritation as needed.  OK to leave at bedside 09/01/18  Yes [provider]  isosorbide dinitrate (ISORDIL) 5 MG tablet Take 5 mg by mouth at bedtime. 09/06/21  Yes [provider]  levothyroxine (SYNTHROID, LEVOTHROID) 75 MCG tablet Take 75 mcg by mouth daily.    Yes [provider]  lidocaine-prilocaine (EMLA) cream Apply 1 application topically as needed (for pain).   Yes [provider]  midodrine (PROAMATINE) 5 MG tablet Take 1 tablet (5 mg total) by mouth 2 (two) times daily with a meal. Patient taking differently: Take 5 mg by mouth every Monday, Wednesday, and Friday with hemodialysis. Take 30 minutes before dialysis 08/21/18  Yes Gladstone Lighter, MD  nystatin (MYCOSTATIN/NYSTOP) 100000 UNIT/GM POWD Apply 1 g topically as needed (for irritation).    Yes [provider]  ondansetron (ZOFRAN-ODT) 4 MG disintegrating tablet Take 4 mg by mouth every 8 (eight) hours as needed for nausea or vomiting. 03/03/20  Yes [provider]  oxybutynin (DITROPAN) 5 MG tablet Take 0.5 tablets (2.5 mg total) by mouth daily as needed for bladder spasms. 09/24/21  Yes Danford, Suann Larry, MD  OXYGEN Inhale 2 L/min into the lungs continuous. 08/21/18  Yes [provider]  polyethylene glycol (MIRALAX / GLYCOLAX) 17 g packet Take 17 g by mouth daily. 10/09/19  Yes Earleen Newport, MD  rosuvastatin (CRESTOR) 40 MG tablet Take 40 mg by mouth daily. 02/18/21  Yes [provider]  Sennosides-Docusate Sodium 8.6-50 MG CAPS Take 2 capsules by mouth 2 (two) times  daily as needed.    Yes [provider]  triamcinolone (KENALOG) 0.5 % cream Apply 1 application topically as needed (for irritation).    Yes [provider]  telmisartan (MICARDIS)  80 MG tablet Take 40 mg by mouth daily. At night Patient not taking: Reported on 01/12/2022    [provider]   Allergies  Allergen Reactions   Ace Inhibitors Other (See Comments)    Reaction:  Unknown    Amlodipine Other (See Comments)    Reaction:  Unknown    Clonidine Hydrochloride Other (See Comments)    Reaction:  Unknown    Codeine Hives   Latex Hives   Metformin Nausea And Vomiting   Tape Rash   Review of Systems  All other systems reviewed and are negative.  Physical Exam Pulmonary:     Effort: Pulmonary effort is normal.  Neurological:     Mental Status: She is alert.    Vital Signs: BP (!) 95/43 (BP Location: Right Arm)   Pulse 92   Temp 98 F (36.7 C)   Resp 20   Ht 5\' 5"  (1.651 m)   Wt 76.3 kg   LMP 02/01/1986 (Approximate)   SpO2 100%   BMI 27.99 kg/m  Pain Scale: 0-10   Pain Score: 0-No pain   SpO2: SpO2: 100 % O2 Device:SpO2: 100 % O2 Flow Rate: .O2 Flow Rate (L/min): 2 L/min  IO: Intake/output summary: No intake or output data in the 24 hours ending 01/18/22 1609  LBM:   Baseline Weight: Weight: 80.6 kg Most recent weight: Weight: 76.3 kg      Asencion Gowda, NP   Please contact Palliative Medicine Team phone at [phone] for questions and concerns.  For individual provider: See Shea Evans

## 2022-01-18 NOTE — Progress Notes (Signed)
Occupational Therapy Treatment ?Patient Details ?Name: Dawn Bradford ?MRN: [id number] ?DOB: 25-Feb-1936 ?Today's Date: 01/18/2022 ? ? ?History of present illness Pt is an 86 year old with history of ESRD on HD MWF, diastolic CHF, COPD, CAD, DM 2, HTN, HLD, hypothyroidism admitted for weakness and altered mental status worsening over the past 8 days.  Recently had her AV fistula decannulated with some drop in hemoglobin.  She also saw outpatient neurology where she was diagnosed with possible UTI. ?  ?OT comments ? Dawn Bradford was seen for OT/PT co-treatment on this date. Upon arrival to room pt reclined in bed, caregiver at bedside, pt noted to be soiled and incontinent of stool. Pt requires MAX rolling for bed level toileting, MAX A x2 for pericare. MAX A x2 sidelying>sit. MOD A don/doff gown in sitting. SETUP + SBA self-drinking seated EOB. TOTAL A x2 sit<>stand for 2 trials to change bed linens. Returned to bed and left with PT at bedside. RN notified of BM. Pt making good progress toward goals. Will continue to follow POC. Discharge recommendation remains appropriate.  ?  ? ?Recommendations for follow up therapy are one component of a multi-disciplinary discharge planning process, led by the attending physician.  Recommendations may be updated based on patient status, additional functional criteria and insurance authorization. ?   ?Follow Up Recommendations ? Home health OT  ?  ?Assistance Recommended at Discharge Frequent or constant Supervision/Assistance  ?Patient can return home with the following ? Two people to help with walking and/or transfers;Two people to help with bathing/dressing/bathroom ?  ?Equipment Recommendations ? Hospital bed;Other (comment) (hoyer lift)  ?  ?Recommendations for Other Services   ? ?  ?Precautions / Restrictions Precautions ?Precautions: Fall ?Restrictions ?Weight Bearing Restrictions: No  ? ? ?  ? ?Mobility Bed Mobility ?Overal bed mobility: Needs Assistance ?Bed Mobility: Rolling,  Supine to Sit, Sit to Supine ?Rolling: Max assist ?Sidelying to sit: Max assist, +2 for physical assistance ?  ?Sit to supine: Max assist, +2 for physical assistance ?  ?  ?  ? ?Transfers ?Overall transfer level: Needs assistance ?Equipment used: 2 person hand held assist ?Transfers: Sit to/from Stand, Bed to chair/wheelchair/BSC ?Sit to Stand: Total assist, +2 physical assistance ?  ?  ?  ?  ? Lateral/Scoot Transfers: Total assist, +2 physical assistance ?  ?  ?  ?Balance Overall balance assessment: Needs assistance ?Sitting-balance support: Single extremity supported, Feet supported ?Sitting balance-Leahy Scale: Fair ?  ?  ?Standing balance support: Bilateral upper extremity supported ?Standing balance-Leahy Scale: Zero ?  ?  ?  ?  ?  ?  ?  ?  ?  ?  ?  ?  ?   ? ?ADL either performed or assessed with clinical judgement  ? ?ADL Overall ADL's : Needs assistance/impaired ?  ?  ?  ?  ?  ?  ?  ?  ?  ?  ?  ?  ?  ?  ?  ?  ?  ?  ?  ?General ADL Comments: MAX rolling for bed level toileting, MAX A x2 for pericare. MOD A don/doff gown in sitting. SETUP + SBA self-drinking seated EOB. ?  ? ? ? ?Cognition Arousal/Alertness: Awake/alert ?Behavior During Therapy: Flat affect ?Overall Cognitive Status: History of cognitive impairments - at baseline ?  ?  ?  ?  ?  ?  ?  ?  ?  ?  ?  ?  ?  ?  ?  ?  ?General Comments: states name,  unable to state location, folllows commands with time and cues ?  ?  ?   ?   ?   ?General Comments SpO2 99% on 2L [place]  ? ? ?Pertinent Vitals/ Pain       Pain Assessment ?Pain Assessment: Faces ?Faces Pain Scale: Hurts a little bit ?Pain Location: pericare ?Pain Descriptors / Indicators: Discomfort, Dull ?Pain Intervention(s): Limited activity within patient's tolerance, Repositioned ? ?   ?   ? ?Frequency ? Min 2X/week  ? ? ? ? ?  ?Progress Toward Goals ? ?OT Goals(current goals can now be found in the care plan section) ? Progress towards OT goals: Progressing toward goals ? ?Acute Rehab OT Goals ?Patient  Stated Goal: to go home ?OT Goal Formulation: With patient/family ?Time For Goal Achievement: 01/27/22 ?Potential to Achieve Goals: Fair ?ADL Goals ?Pt Will Perform Grooming: with min guard assist;sitting ?Pt Will Perform Upper Body Bathing: with min assist;sitting ?Pt Will Transfer to Toilet: with mod assist;with +2 assist;stand pivot transfer;bedside commode  ?Plan Discharge plan remains appropriate;Frequency remains appropriate   ? ?Co-evaluation ? ? ? PT/OT/SLP Co-Evaluation/Treatment: Yes ?Reason for Co-Treatment: Necessary to address cognition/behavior during functional activity;For patient/therapist safety;To address functional/ADL transfers ?PT goals addressed during session: Mobility/safety with mobility ?OT goals addressed during session: ADL's and self-care ?  ? ?  ?AM-PAC OT "6 Clicks" Daily Activity     ?Outcome Measure ? ? Help from another person eating meals?: A Little ?Help from another person taking care of personal grooming?: A Lot ?Help from another person toileting, which includes using toliet, bedpan, or urinal?: A Lot ?Help from another person bathing (including washing, rinsing, drying)?: A Lot ?Help from another person to put on and taking off regular upper body clothing?: A Lot ?Help from another person to put on and taking off regular lower body clothing?: A Lot ?6 Click Score: 13 ? ?  ?End of Session Equipment Utilized During Treatment: Gait belt ? ?OT Visit Diagnosis: Other abnormalities of gait and mobility (R26.89);Muscle weakness (generalized) (M62.81) ?  ?Activity Tolerance Patient tolerated treatment well ?  ?Patient Left in bed;with call bell/phone within reach;Other (comment) (PT in room) ?  ?Nurse Communication Mobility status ?  ? ?   ? ?Time: 3149-7026 ?OT Time Calculation (min): 26 min ? ?Charges: OT General Charges ?$OT Visit: 1 Visit ?OT Treatments ?$Self Care/Home Management : 8-22 mins ? ?Dawn Bradford, M.S. OTR/L  ?01/18/22, 3:07 PM  ?ascom [phone] ? ?

## 2022-01-18 NOTE — Progress Notes (Signed)
[facility] hospital liaison note: ?  ?Patient had dialysis 5.15. Per chart review, plan is for PMT to conduct [place] conversation today with family. ? ?Hospice approval is pending at this time as family continues to finalize care (comfort vs. Aggressive/life extending). ?  ?Liaison will continue to follow and await family decision.  ?  ?Thank you for the opportunity to involved in the care of this patient and her family. ?  ?Daphene Calamity, MSW ?Hospital Liaison ?Manufacturing engineer ?[phone] ?

## 2022-01-19 DIAGNOSIS — G9341 Metabolic encephalopathy: Secondary | ICD-10-CM | POA: Diagnosis not present

## 2022-01-19 DIAGNOSIS — Z7189 Other specified counseling: Secondary | ICD-10-CM | POA: Diagnosis not present

## 2022-01-19 LAB — BASIC METABOLIC PANEL
Anion gap: 12 (ref 5–15)
BUN: 17 mg/dL (ref 8–23)
CO2: 25 mmol/L (ref 22–32)
Calcium: 8.5 mg/dL — ABNORMAL LOW (ref 8.9–10.3)
Chloride: 96 mmol/L — ABNORMAL LOW (ref 98–111)
Creatinine, Ser: 3.19 mg/dL — ABNORMAL HIGH (ref 0.44–1.00)
GFR, Estimated: 14 mL/min — ABNORMAL LOW (ref >60)
Glucose, Bld: 96 mg/dL (ref 70–99)
Potassium: 4 mmol/L (ref 3.5–5.1)
Sodium: 133 mmol/L — ABNORMAL LOW (ref 135–145)

## 2022-01-19 LAB — PHOSPHORUS: Phosphorus: 4.1 mg/dL (ref 2.5–4.6)

## 2022-01-19 LAB — GLUCOSE, CAPILLARY
Glucose-Capillary: 121 mg/dL — ABNORMAL HIGH (ref 70–99)
Glucose-Capillary: 109 mg/dL — ABNORMAL HIGH (ref 70–99)
Glucose-Capillary: 120 mg/dL — ABNORMAL HIGH (ref 70–99)

## 2022-01-19 LAB — CBC
HCT: 32.2 % — ABNORMAL LOW (ref 36.0–46.0)
Hemoglobin: 9.8 g/dL — ABNORMAL LOW (ref 12.0–15.0)
MCH: 32.5 pg (ref 26.0–34.0)
MCHC: 30.4 g/dL (ref 30.0–36.0)
MCV: 106.6 fL — ABNORMAL HIGH (ref 80.0–100.0)
Platelets: 121 10*3/uL — ABNORMAL LOW (ref 150–400)
RBC: 3.02 MIL/uL — ABNORMAL LOW (ref 3.87–5.11)
RDW: 18.6 % — ABNORMAL HIGH (ref 11.5–15.5)
WBC: 8.7 10*3/uL (ref 4.0–10.5)
nRBC: 0.2 % (ref 0.0–0.2)

## 2022-01-19 LAB — MAGNESIUM: Magnesium: 2.2 mg/dL (ref 1.7–2.4)

## 2022-01-19 LAB — ABO/RH: ABO/RH(D): O POS

## 2022-01-19 MED ORDER — EPOETIN ALFA 10000 UNIT/ML IJ SOLN
INTRAMUSCULAR | Status: AC
  Filled 2022-01-19: qty 1

## 2022-01-19 MED ORDER — MIDODRINE HCL 5 MG PO TABS
ORAL_TABLET | ORAL | Status: AC
  Filled 2022-01-19: qty 1

## 2022-01-19 NOTE — Progress Notes (Addendum)
Daily Progress Note   Patient Name: Dawn Bradford       Date: 01/19/2022 DOB: [birth year]  Age: 86 y.o. MRN#: [id number] Attending Physician: Sidney Ace, MD Primary Care Physician: Pcp, No Admit Date: 01/11/2022  Reason for Consultation/Follow-up: Establishing goals of care  Subjective: Notes and labs reviewed. Into bedside. Patient is in dialysis. Nephrology meeting with family at 10:00. Entered and nephrology is speaking with 2 daughters Dawn Bradford and Dawn Bradford. Following this nephrology left and I introduced myself. 3rd daughter is not present. Daughters discussed an unwelcome conversation they had previously with outpatient palliative. They discuss her current status as well as baseline, and questions answered. They discuss family, and their father's death. They state that the 3 of them make decisions together, and we discussed that the goal will be to determine patient's wishes.    Returned to bedside to meet with 3 daughters. They have an advance directive document with them. It lists Dawn Bradford as primary HPOA now that patient's husband has died. It is marked for no life prolonging care if dementia is significantly affecting cognitive status. We discussed the document and patient's wishes. Dawn Bradford states their mother did not recognize herself in a mirror recently. We discuss her need for assistance with all ADL's. Discussed her other various symptoms of dementia including dysphagia. Educated on dementia progression. They discussed not wanting to see her suffer, or have pain. Discussed pain and suffering, discussed her inability to always express needs. They discuss her caring and nurturing nature. 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   We discussed her diagnoses, prognosis, GOC, EOL wishes disposition and options.  Created space and opportunity for patient  to explore thoughts and feelings regarding current medical information.   A detailed discussion was had today regarding advanced directives.  Concepts specific to code status, artifical feeding and hydration, IV antibiotics and rehospitalization were discussed.  The difference between an aggressive medical intervention path and a comfort care path was discussed.  Values and goals of care important to patient and family were attempted to be elicited.  Discussed limitations of medical interventions to prolong quality of life in some situations and discussed the concept of human mortality. Discussed determining acceptable QOL. Revisited  the living will document. Returned to discussing what she would want, and current dementia symptoms. They discuss their father, and that their mother always wanted to care for and please others. Continued to discuss her wishes and the document in great detail, and her wishes vs being able to comply with or withstand treatment.    Discussed different scenarios. Questions answered. They discuss wanting her to be at home.  Family states they would like to see how she does at home and make decisions from there. RN to bedside to explain dialysis ended early due to BP. We discussed this. Patient returned to room at the end of conversation and was tucked in by staff. Dawn Bradford states though she is HPOA, they will make decisions together.  I completed a MOST form today with patient's 3 daughters on the unit and the signed original was placed in the chart. A photocopy was placed in the chart to be scanned into EMR. The patient outlined their wishes for the following treatment decisions:  Cardiopulmonary Resuscitation: Do Not Attempt Resuscitation (DNR/No CPR)  Medical Interventions: Limited Additional  Interventions: Use medical treatment, IV fluids and cardiac monitoring as indicated, DO NOT USE intubation or mechanical ventilation. May consider use of less invasive airway support such as BiPAP or CPAP. Also provide comfort measures. Transfer to the hospital if indicated. Avoid intensive care.   Antibiotics: Antibiotics if indicated  IV Fluids: IV fluids if indicated  Feeding Tube: No feeding tube     Length of Stay: 7  Current Medications: Scheduled Meds:   aspirin EC  81 mg Oral Daily   carvedilol  3.125 mg Oral Q supper   carvedilol  3.125 mg Oral Once per day on Sun Tue Thu Sat   epoetin alfa       epoetin (EPOGEN/PROCRIT) injection  10,000 Units Intravenous Q M,W,F-HD   FLUoxetine  40 mg Oral Daily   folic acid  1 mg Oral Daily   heparin injection (subcutaneous)  5,000 Units Subcutaneous Q8H   insulin aspart  0-15 Units Subcutaneous TID AC & HS   irbesartan  75 mg Oral Daily   isosorbide dinitrate  5 mg Oral QHS   levothyroxine  75 mcg Oral Q0600   mouth rinse  15 mL Mouth Rinse BID   midodrine       midodrine  5 mg Oral Q M,W,F-HD   nystatin   Topical Q8H   pantoprazole sodium  40 mg Oral BID   polyethylene glycol  17 g Oral Daily   rosuvastatin  10 mg Oral Daily    Continuous Infusions:  sodium chloride Stopped (01/12/22 0243)   sodium chloride 10 mL/hr at 01/13/22 2309    PRN Meds: sodium chloride, acetaminophen **OR** acetaminophen, albuterol, clonazePAM, lidocaine-prilocaine, magnesium hydroxide, nystatin, ondansetron **OR** ondansetron (ZOFRAN) IV, oxybutynin, senna-docusate, traZODone  Physical Exam Pulmonary:     Effort: Pulmonary effort is normal.  Neurological:     Mental Status: She is alert.            Vital Signs: BP (!) 120/46   Pulse 90   Temp 97.7 F (36.5 C)   Resp (!) 24   Ht 5\' 5"  (1.651 m)   Wt 76.9 kg   LMP 02/01/1986 (Approximate)   SpO2 100%   BMI 28.21 kg/m  SpO2: SpO2: 100 % O2 Device: O2 Device: Nasal Cannula O2 Flow Rate:  O2 Flow Rate (L/min): 2 L/min  Intake/output summary:  Intake/Output Summary (Last 24 hours) at 01/19/2022 1448 Last data  filed at 01/19/2022 1317 Gross per 24 hour  Intake --  Output 740 ml  Net -740 ml   LBM: Last BM Date : 01/19/22 Baseline Weight: Weight: 80.6 kg Most recent weight: Weight: 76.9 kg   Patient Active Problem List   Diagnosis Date Noted   Metabolic encephalopathy 70/26/3785   Acute lower UTI 01/12/2022   Anxiety and depression 01/12/2022   Type 2 diabetes mellitus with chronic kidney disease, with long-term current use of insulin ([place]) 01/12/2022   End-stage renal disease on hemodialysis ([place]) 01/12/2022   Iron deficiency anemia due to chronic blood loss 01/12/2022   Acute on chronic diastolic CHF (congestive heart failure) ([place]) 09/21/2021   Acute pulmonary edema ([place]) 09/21/2021   Acute on chronic respiratory failure with hypoxia ([place]) 09/21/2021   COPD exacerbation ([place]) 09/21/2021   Elevated troponin 09/21/2021   Sacral decubitus ulcer, stage II ([place]) 09/21/2021   Pain due to onychomycosis of toenails of both feet 11/28/2019   Pain, generalized 09/12/2018   Palliative care encounter 09/12/2018   Anemia due to end stage renal disease ([place]) 09/05/2018   Cellulitis of right hip 09/01/2018   Hypertensive heart and renal disease with congestive heart failure and end stage renal disease ([place]) 08/23/2018   COPD with asthma ([place]) 08/23/2018   Acquired hypothyroidism 08/23/2018   Dyslipidemia associated with type 2 diabetes mellitus ([place]) 08/23/2018   Chronic kidney disease with end stage renal disease on dialysis due to type 2 diabetes mellitus ([place]) 08/23/2018   Diabetes mellitus type 2 with retinopathy ([place]) 08/23/2018   Controlled type 2 diabetes with renal manifestation 08/23/2018   Chronic cerebrovascular accident (CVA) 08/23/2018   Chronic constipation 08/23/2018   Protein-calorie malnutrition, severe ([place]) 08/23/2018   Closed comminuted intertrochanteric  fracture of proximal end of right femur ([place]) 08/23/2018   Closed fracture of right hip with routine healing 08/22/2018   Closed intertrochanteric fracture of hip, right, initial encounter ([place]) 08/11/2018   Hypoxia 10/12/2017   Hx of CABG 10/18/2016   Nonrheumatic aortic valve stenosis 10/18/2016   ESRD on dialysis ([place]) 07/07/2016   Background diabetic retinopathy ([place]) 01/04/2016   Chest pain 08/19/2015   Coronary artery disease involving native heart with angina pectoris ([place]) 08/18/2015   BMI 38.0-38.9,adult 07/07/2015   Cognitive deficit, post-stroke 07/07/2015   H/O ischemic left MCA stroke 02/19/2015   Expressive aphasia 02/03/2015   Carotid stenosis 02/03/2015   Diabetes mellitus type 2 in obese ([place]) 01/31/2015   End stage renal disease ([place]) 01/31/2015   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 03/08/2011   Diabetic glomerulopathy ([place]) 03/08/2011   Edema 03/08/2011   Obstructive chronic bronchitis without exacerbation ([place]) 03/08/2011   Recurrent major depressive disorder, in partial remission ([place]) 03/08/2011   Unspecified cataract 03/08/2011   Solitary pulmonary nodule 03/08/2011   Mild intermittent asthma 03/29/2010   CAD, ARTERY BYPASS GRAFT 03/02/2010   Obesity hypoventilation syndrome ([place]) 02/05/2010   Chronic depression 01/15/2010   Shortness of breath 12/24/2009   CAROTID BRUIT 88/50/2774   DIASTOLIC HEART FAILURE, CHRONIC 02/17/2009   DIABETES MELLITUS, TYPE II 01/29/2009   Hyperlipidemia 01/29/2009   Obstructive sleep apnea 01/29/2009   Essential hypertension 01/29/2009   Aortic valve disorder 01/29/2009   RENAL ARTERY STENOSIS 01/29/2009   RENAL DISEASE, CHRONIC 01/29/2009    Palliative Care Assessment & Plan   Recommendations/Plan:  Time for outcomes.  Dawn Bradford is HPOA but would like to make all decisions with her sisters.   The daughters tell me  they have been asked a number of times during this admission if they want to  continue dialysis, and have been told numerous times, and understand her dementia is irreversible. They would like space to make the decision.    Code Status:    Code Status Orders  (From admission, onward)           Start     Ordered   01/12/22 0154  Do not attempt resuscitation (DNR)  Continuous       Question Answer Comment  In the event of cardiac or respiratory ARREST Do not call a "code blue"   In the event of cardiac or respiratory ARREST Do not perform Intubation, CPR, defibrillation or ACLS   In the event of cardiac or respiratory ARREST Use medication by any route, position, wound care, and other measures to relive pain and suffering. May use oxygen, suction and manual treatment of airway obstruction as needed for comfort.      01/12/22 0153           Code Status History     Date Active Date Inactive Code Status Order ID Comments User Context   09/21/2021 1628 09/25/2021 0021 DNR [id number]  Ivor Costa, MD ED   09/21/2021 1339 09/21/2021 1628 Full Code [id number]  Ivor Costa, MD ED   08/11/2018 0230 08/21/2018 2038 Full Code [id number]  Arta Silence, MD Inpatient   10/12/2017 0543 10/15/2017 1738 Full Code [id number]  Harrie Foreman, MD Inpatient   08/19/2015 1914 08/20/2015 2113 Full Code [id number]  Theodoro Grist, MD Inpatient   01/31/2015 2220 02/03/2015 1923 Full Code [id number]  Aldean Jewett, MD ED       Prognosis:  Poor    Care plan was discussed with nephrology and primary team, and RN.   Thank you for allowing the Palliative Medicine Team to assist in the care of this patient.  Extended visit - 1 hour first visit with 2 sisters, 2 hours with 3 sisters at 12:15 meeting.  Asencion Gowda, NP  Please contact Palliative Medicine Team phone at [phone] for questions and concerns.

## 2022-01-19 NOTE — Progress Notes (Signed)
?[facility]  ?ROUNDING NOTE  ? ?Subjective:  ?Patient is a 86 year old female with history of ESRD on hemodialysis Monday Wednesday Friday, diastolic CHF, COPD, CAD, DM 2, hypertension, hyperlipidemia, hypothyroidism admitted for generalized weakness and altered mental status.  ? ?Update ?Patient seen and evaluated during dialysis ?  ?HEMODIALYSIS FLOWSHEET: ? ?Blood Flow Rate (mL/min): 400 mL/min ?Arterial Pressure (mmHg): -180 mmHg ?Venous Pressure (mmHg): 200 mmHg ?Transmembrane Pressure (mmHg): 70 mmHg ?Ultrafiltration Rate (mL/min): 410 mL/min ?Dialysate Flow Rate (mL/min): 500 ml/min ?Conductivity: Machine : 13.6 ?Conductivity: Machine : 13.6 ?Dialysis Fluid Bolus: Normal Saline ?Bolus Amount (mL): 250 mL ? ?Tolerating treatment well ? ? ?Objective:  ?Vital signs in last 24 hours:  ?Temp:  [97.7 ?F (36.5 ?C)-98.4 ?F (36.9 ?C)] 98.2 ?F (36.8 ?C) (05/17 1017) ?Pulse Rate:  [27-98] 98 (05/17 1245) ?Resp:  [15-25] 18 (05/17 1245) ?BP: (87-141)/(36-80) 99/45 (05/17 1230) ?SpO2:  [100 %] 100 % (05/17 0747) ?Weight:  [77.6 kg] 77.6 kg (05/17 1017) ? ?Weight change:  ?Filed Weights  ? 01/17/22 0953 01/17/22 1301 01/19/22 1017  ?Weight: 76.7 kg 76.3 kg 77.6 kg  ? ? ?Intake/Output: ?No intake/output data recorded. ?  ?Intake/Output this shift: ? No intake/output data recorded. ? ?Physical Exam: ?General: No acute distress  ?Head: Normocephalic, atraumatic. Moist oral mucosal membranes  ?Eyes: Anicteric  ?Lungs:  Clear to auscultation, normal effort  ?Heart: S1S2 no rubs  ?Abdomen:  Soft, nontender, bowel sounds present  ?Extremities:  Trace edema, no cynosis.  ?Neurologic: Awake, alert, following commands  ?Skin: No acute rash  ?Access: LUE AVF  ? ? ?Basic Metabolic Panel: ?Recent Labs  ?Lab 01/15/22 ?8413 01/16/22 ?0604 01/17/22 ?1753 01/18/22 ?2440 01/19/22 ?1027  ?NA 138 137 134* 133* 133*  ?K 4.0 4.1 3.6 3.9 4.0  ?CL 104 103 97* 96* 96*  ?CO2 29 29 30 29 25   ?GLUCOSE 101* 107* 120* 94 96  ?BUN 15 20 13  14 17   ?CREATININE 2.79* 3.35* 2.26* 2.59* 3.19*  ?CALCIUM 8.3* 8.6* 8.1* 8.3* 8.5*  ?MG 1.9 2.0 1.7 2.0 2.2  ?PHOS  --   --  2.7 3.6 4.1  ? ? ? ?Liver Function Tests: ?No results for input(s): AST, ALT, ALKPHOS, BILITOT, PROT, ALBUMIN in the last 168 hours. ? ?No results for input(s): LIPASE, AMYLASE in the last 168 hours. ?No results for input(s): AMMONIA in the last 168 hours. ? ?CBC: ?Recent Labs  ?Lab 01/15/22 ?2536 01/16/22 ?0604 01/17/22 ?1753 01/18/22 ?6440 01/19/22 ?3474  ?WBC 10.1 9.8 9.8 9.7 8.7  ?HGB 8.3* 9.2* 8.5* 9.8* 9.8*  ?HCT 27.1* 29.8* 27.5* 32.2* 32.2*  ?MCV 105.9* 107.2* 103.8* 106.3* 106.6*  ?PLT 137* 168 135* 144* 121*  ? ? ? ?Cardiac Enzymes: ?No results for input(s): CKTOTAL, CKMB, CKMBINDEX, TROPONINI in the last 168 hours. ? ?BNP: ?Invalid input(s): POCBNP ? ?CBG: ?Recent Labs  ?Lab 01/18/22 ?0803 01/18/22 ?1127 01/18/22 ?1646 01/18/22 ?2001 01/19/22 ?0753  ?GLUCAP 106* 121* 104* 114* 121*  ? ? ? ?Microbiology: ?Results for orders placed or performed during the hospital encounter of 01/11/22  ?MRSA Next Gen by PCR, Nasal     Status: None  ? Collection Time: 01/12/22  6:23 PM  ? Specimen: Nasal Mucosa; Nasal Swab  ?Result Value Ref Range Status  ? MRSA by PCR Next Gen NOT DETECTED NOT DETECTED Final  ?  Comment: (NOTE) ?The GeneXpert MRSA Assay (FDA approved for NASAL specimens only), ?is one component of a comprehensive MRSA colonization surveillance ?program. It is not intended to  diagnose MRSA infection nor to guide ?or monitor treatment for MRSA infections. ?Test performance is not FDA approved in patients less than 2 years ?old. ?Performed at [facility], [place], ?[place] [address] ?  ? ? ?Coagulation Studies: ?No results for input(s): LABPROT, INR in the last 72 hours. ? ?Urinalysis: ?No results for input(s): COLORURINE, LABSPEC, [place], GLUCOSEU, HGBUR, BILIRUBINUR, KETONESUR, PROTEINUR, UROBILINOGEN, NITRITE, LEUKOCYTESUR in the last 72 hours. ? ?Invalid  input(s): APPERANCEUR  ? ? ?Imaging: ?No results found. ? ? ?Medications:  ? ? sodium chloride Stopped (01/12/22 0243)  ? sodium chloride 10 mL/hr at 01/13/22 2309  ? ? aspirin EC  81 mg Oral Daily  ? carvedilol  3.125 mg Oral Q supper  ? carvedilol  3.125 mg Oral Once per day on Sun Tue Thu Sat  ? epoetin alfa      ? epoetin (EPOGEN/PROCRIT) injection  10,000 Units Intravenous Q M,W,F-HD  ? FLUoxetine  40 mg Oral Daily  ? folic acid  1 mg Oral Daily  ? heparin injection (subcutaneous)  5,000 Units Subcutaneous Q8H  ? insulin aspart  0-15 Units Subcutaneous TID AC & HS  ? irbesartan  75 mg Oral Daily  ? isosorbide dinitrate  5 mg Oral QHS  ? levothyroxine  75 mcg Oral Q0600  ? mouth rinse  15 mL Mouth Rinse BID  ? midodrine      ? midodrine  5 mg Oral Q M,W,F-HD  ? nystatin   Topical Q8H  ? pantoprazole sodium  40 mg Oral BID  ? polyethylene glycol  17 g Oral Daily  ? rosuvastatin  10 mg Oral Daily  ? ?sodium chloride, acetaminophen **OR** acetaminophen, albuterol, clonazePAM, lidocaine-prilocaine, magnesium hydroxide, nystatin, ondansetron **OR** ondansetron (ZOFRAN) IV, oxybutynin, senna-docusate, traZODone ? ?Assessment/ Plan:  ?86 y.o. female with a past medical history of end-stage renal disease on hemodialysis on Monday Wednesday Friday schedule, diastolic CHF, COPD, coronary artery disease, diabetes mellitus, hypertension, hyperlipidemia, hypothyroidism admitted for weakness and altered mental status. She is currently undergoing treatment for metabolic encephalopathy and UTI secondary to E. Coli. Her mental status still waxing and waning.  ? ?End-stage renal disease, on dialysis Monday Wednesday Friday schedule.   ?Patient receiving dialysis, UF goal 1 L as tolerated.  Patient more cooperative during treatment today with minimal yelling out and no agitation.  Patient did become hypotensive twice during treatment, UF goal turned off temporarily.  Patient received 200 mL normal saline bolus at completion of  treatment for blood pressure support. ? Family meeting conducted with primary team, dialysis liaison, and patient's daughters [name] and [name].  Concerns were voiced pertaining to the patient's mentation and decline.  He was recorded patient was very agitated, yelling out, and uncooperative at times during previous dialysis treatment.  Patient appears to be tolerating dialysis well today.  Family reports caregivers stated patient having mobility decline at home.  Daughters feel this may be due to the blood loss on admission.  Requesting therapy to assist with mobility since this has been corrected.  Daughters appear understanding and agreed to continue to monitor patient.  If mentation continues to decline and/or mobility remains limited, they will consider hospice. ? ?Hypotension.On midodrine.  Blood pressure noted to be improving.  Status post dosage adjustment for Coreg Avapro and Imdur by primary care team.  ? Blood pressure 106/36 at completion of dialysis.  Patient given normal saline bolus of 200 mL.  Blood pressure declined twice during treatment, corrected with reduced and  held UF. ? ?Anemia of chronic disease.  Monitor H&H closely.  Hemoglobin currently 9.8. ? ?Secondary hyperparathyroidism/hyperphosphatemia.  ? Calcium and phosphorus remain within acceptable range.  We will continue to monitor ? ?Metabolic encephalopathy most likely infectious etiology secondary to UTI.  CT head negative for any acute pathology.  ? Completed recommended antibiotic cycle ? ? ? ? ? ? LOS: 7 ?[place] ?5/17/20231:17 PM ?  ?

## 2022-01-19 NOTE — Progress Notes (Signed)
Hemodialysis Post Treatment Note  ?Treatment Date:  Jan 19, 2022  ?Access: LFA AVF  ?Treatment Time: 2 hours 51 minutes  ?UF Removed: 740 ml  ? Next Scheduled Treatment: 01/21/2022 ? Note:  ?Patient completed hemodialysis treatment as ordered with private aide at bedside. Patient appear calm, cooperative and confused. Patient experienced asymptomatic hypotension during treatment, treatment was eventually terminated due to hypotension with 9 minutes left of treatment. Patient receive an extra 200 ml of NS and decrease in UF from 2 liters to 1 liter for blood pressure support during the course of treatment. Patient with no s/s of acute cardiopulmonary distress. Report given to floor nurse Pauline Good, RN. ? ?

## 2022-01-19 NOTE — Progress Notes (Signed)
?PROGRESS NOTE ? ? ? ?Dawn Bradford  OZD:[id number] DOB: 10-16-1935 DOA: 01/11/2022 ?PCP: Pcp, No  ? ? ?Brief Narrative:  ?This 86 year old female with history of ESRD on HD MWF, diastolic CHF, COPD, CAD, DM 2, HTN, HLD, hypothyroidism admitted for  generalized weakness and altered mental status worsening over the past 8 days.  Recently had her AV fistula decannulated with some drop in hemoglobin.  She also saw outpatient neurology where she was diagnosed with possible UTI and was prescribed Cipro but had not taken it.  Upon admission her hemoglobin was 6.7, EKG showed A-fib.  CT head was negative for acute pathology but showed chronic sinusitis, CT abdomen pelvis showed cardiomegaly with bilateral pleural effusion, 2.2 cm cystic pancreatic lesion.  ?She is admitted for metabolic encephalopathy secondary to UTI  and she is started on IV Rocephin.  Nephrology is consulted for continuation of hemodialysis.  Palliative care consulted to discuss goals of care. ? ?5/17: Met with the patient's 2 daughters at bedside also in conjunction with nephrology and palliative care.  At this point we are pursuing aggressive care.  Patient had some issues with hemodialysis today.  Hypotension ports a temporary stop.  Patient's blood pressure recovered with small fluid bolus and addition of midodrine. ? ?Assessment & Plan: ?  ?Principal Problem: ?  Metabolic encephalopathy ?Active Problems: ?  Acute lower UTI ?  End-stage renal disease on hemodialysis ([place]) ?  Iron deficiency anemia due to chronic blood loss ?  Essential hypertension ?  Acquired hypothyroidism ?  Anxiety and depression ?  Type 2 diabetes mellitus with chronic kidney disease, with long-term current use of insulin ([place]) ? ?Metabolic Encephalopathy: ?UTI secondary to E. coli. ?Outpatient urine culture grew E. coli. Initiated  IV ceftriaxone. ?She became lethargic after HD on 01/14/22 ?CT of the head is negative for acute pathology.  No focal neurodeficits.   ?Patient received  9-day course of antibiotics ?Plan: ?Discontinue antibiotics ?Frequent neurochecks ?Avoid sedatives, anticholinergics, narcotics ?Frequent reorienting measures ?  ?Iron deficiency anemia: ?Likely Chronic blood loss and macrocytosis. ?She has recent decannulation of her AV fistula.  Hemoccult is negative.   ?Status post 1 unit PRBC on 5/10, hemoglobin 9.0 >9.8 ?Folate deficiency, continue folic acid supplement. ?No negation for transfusion at this time ?  ?End-stage renal disease on hemodialysis ([place]) ?Nephrology consulted.  Status post HD 5/10.  EPO increased. ?Had HD on 01/17/22. Next hemodialysis 01/19/2022. ?Nephrology has already discussed with family about poor quality of life having dialysis. ?Palliative care consulted to discuss goals of care /continuation of dialysis. ?At this time still pursuing aggressive care ?In order to safely discharge patient with an outpatient HD chair time she will need to be able to tolerate hemodialysis sitting up.  Will attempt this on 5/19 ?  ?History of CAD: s/p CABG ?History of CVA ?Continue aspirin and Crestor ?Continue Coreg, Avapro, isosorbide ?  ?Type 2 diabetes mellitus with chronic kidney disease, with long-term current use of insulin ([place]) ?Sliding scale and Accu-Cheks. A1c = 5.7 ?  ?Anxiety and depression ?Continue Prozac and as needed Klonopin. ?  ?Acquired hypothyroidism ?Continue Synthroid. ?  ?Essential hypertension ?Currently on Coreg, Avapro, isosorbide.  Also gets midodrine prior to HD. ?Decreased dose of Coreg with holding parameters, use holding parameters for Avapro and Imdur ?  ?  ?Sacral decubitus ulcer stage II-present POA ?Turn patient every 2 hourly and continue fall precautions. ? ? ?DVT prophylaxis: SCD ?Code Status: DNR ?Family Communication: 2 daughters at bedside 5/17 ?Disposition Plan:  Status is: Inpatient ?Remains inpatient appropriate because: Acute metabolic encephalopathy.  ESRD on HD.  Will need to sit up in chair prior to discharge  planning. ? ? ?Level of care: Med-Surg ? ?Consultants:  ?Nephrology ?Palliative care ? ?Procedures:  ?None ? ?Antimicrobials: ?None ? ? ?Subjective: ?Seen and examined after HD.  Lying in bed.  No visible distress.  2 daughters at bedside.  Patient pleasantly confused. ? ?Objective: ?Vitals:  ? 01/19/22 1345 01/19/22 1349 01/19/22 1352 01/19/22 1400  ?BP: (!) 100/36   (!) 120/46  ?Pulse: 89 94 90 90  ?Resp: 17 (!) 21 (!) 30 (!) 24  ?Temp:    97.7 ?F (36.5 ?C)  ?TempSrc:      ?SpO2:    100%  ?Weight:  76.9 kg    ?Height:      ? ? ?Intake/Output Summary (Last 24 hours) at 01/19/2022 1449 ?Last data filed at 01/19/2022 1317 ?Gross per 24 hour  ?Intake --  ?Output 740 ml  ?Net -740 ml  ? ?Filed Weights  ? 01/17/22 1301 01/19/22 1017 01/19/22 1349  ?Weight: 76.3 kg 77.6 kg 76.9 kg  ? ? ?Examination: ? ?General exam: No acute distress.  Appears deconditioned ?Respiratory system: Lungs clear.  Normal work of breathing.  Room air ?Cardiovascular system: S1-S2, RRR, no murmurs, no pedal edema ?Gastrointestinal system: Soft, NT/ND, normal bowel sounds ?Central nervous system: Alert and oriented x1. No focal neurological deficits. ?Extremities: Symmetric 5 x 5 power. ?Skin: No rashes, lesions or ulcers ?Psychiatry: Judgement and insight appear impaired. Mood & affect confused.  ? ? ? ?Data Reviewed: I have personally reviewed following labs and imaging studies ? ?CBC: ?Recent Labs  ?Lab 01/15/22 ?2641 01/16/22 ?0604 01/17/22 ?1753 01/18/22 ?5830 01/19/22 ?9407  ?WBC 10.1 9.8 9.8 9.7 8.7  ?HGB 8.3* 9.2* 8.5* 9.8* 9.8*  ?HCT 27.1* 29.8* 27.5* 32.2* 32.2*  ?MCV 105.9* 107.2* 103.8* 106.3* 106.6*  ?PLT 137* 168 135* 144* 121*  ? ?Basic Metabolic Panel: ?Recent Labs  ?Lab 01/15/22 ?6808 01/16/22 ?0604 01/17/22 ?1753 01/18/22 ?8110 01/19/22 ?3159  ?NA 138 137 134* 133* 133*  ?K 4.0 4.1 3.6 3.9 4.0  ?CL 104 103 97* 96* 96*  ?CO2 '29 29 30 29 25  ' ?GLUCOSE 101* 107* 120* 94 96  ?BUN '15 20 13 14 17  ' ?CREATININE 2.79* 3.35* 2.26* 2.59*  3.19*  ?CALCIUM 8.3* 8.6* 8.1* 8.3* 8.5*  ?MG 1.9 2.0 1.7 2.0 2.2  ?PHOS  --   --  2.7 3.6 4.1  ? ?GFR: ?Estimated Creatinine Clearance: 13.5 mL/min (A) (by C-G formula based on SCr of 3.19 mg/dL (H)). ?Liver Function Tests: ?No results for input(s): AST, ALT, ALKPHOS, BILITOT, PROT, ALBUMIN in the last 168 hours. ?No results for input(s): LIPASE, AMYLASE in the last 168 hours. ?No results for input(s): AMMONIA in the last 168 hours. ?Coagulation Profile: ?No results for input(s): INR, PROTIME in the last 168 hours. ?Cardiac Enzymes: ?No results for input(s): CKTOTAL, CKMB, CKMBINDEX, TROPONINI in the last 168 hours. ?BNP (last 3 results) ?No results for input(s): PROBNP in the last 8760 hours. ?HbA1C: ?No results for input(s): HGBA1C in the last 72 hours. ?CBG: ?Recent Labs  ?Lab 01/18/22 ?0803 01/18/22 ?1127 01/18/22 ?1646 01/18/22 ?2001 01/19/22 ?0753  ?GLUCAP 106* 121* 104* 114* 121*  ? ?Lipid Profile: ?No results for input(s): CHOL, HDL, LDLCALC, TRIG, CHOLHDL, LDLDIRECT in the last 72 hours. ?Thyroid Function Tests: ?No results for input(s): TSH, T4TOTAL, FREET4, T3FREE, THYROIDAB in the last 72 hours. ?Anemia Panel: ?No results  for input(s): VITAMINB12, FOLATE, FERRITIN, TIBC, IRON, RETICCTPCT in the last 72 hours. ?Sepsis Labs: ?No results for input(s): PROCALCITON, LATICACIDVEN in the last 168 hours. ? ?Recent Results (from the past 240 hour(s))  ?MRSA Next Gen by PCR, Nasal     Status: None  ? Collection Time: 01/12/22  6:23 PM  ? Specimen: Nasal Mucosa; Nasal Swab  ?Result Value Ref Range Status  ? MRSA by PCR Next Gen NOT DETECTED NOT DETECTED Final  ?  Comment: (NOTE) ?The GeneXpert MRSA Assay (FDA approved for NASAL specimens only), ?is one component of a comprehensive MRSA colonization surveillance ?program. It is not intended to diagnose MRSA infection nor to guide ?or monitor treatment for MRSA infections. ?Test performance is not FDA approved in patients less than 2 years ?old. ?Performed at  [facility], [place], ?[place] [address] ?  ?  ? ? ? ? ? ?Radiology Studies: ?No results found. ? ? ? ? ? ?Scheduled Meds: ? aspirin EC  81 mg Oral Daily  ? carvedilol  3.125 mg Oral Q supper  ?

## 2022-01-19 NOTE — Progress Notes (Signed)
PT Cancellation Note ? ?Patient Details ?Name: Dawn Bradford ?MRN: [id number] ?DOB: Feb 11, 1936 ? ? ?Cancelled Treatment:    Reason Eval/Treat Not Completed: Other (comment). Pt currently out of room for HD at this time. Will re-attempt next available date. ? ? ?[name] ?01/19/2022, 10:47 AM ?Greggory Stallion, PT, DPT, GCS ?[phone] ? ?

## 2022-01-19 NOTE — Progress Notes (Signed)
[facility] hospital liaison note: ?  ?Patient had dialysis 5.17.  Per chart review, [place] conversation held with [place]. Family wants to continue treatment and monitor how patient responds. If patient begins to decline, then they wish to pursue [facility] @ home.   ?  ?Hospice approval is pending at this time as family continues to finalize care (comfort vs. Aggressive/life extending). ?  ?Liaison will continue to follow and await family decision.  ?  ?Thank you for the opportunity to involved in the care of this patient and her family. ?  ?Daphene Calamity, MSW ?Hospital Liaison ?Manufacturing engineer ?[phone] ?

## 2022-01-20 ENCOUNTER — Inpatient Hospital Stay: Payer: Medicare Other

## 2022-01-20 DIAGNOSIS — G9341 Metabolic encephalopathy: Secondary | ICD-10-CM | POA: Diagnosis not present

## 2022-01-20 LAB — GLUCOSE, CAPILLARY
Glucose-Capillary: 106 mg/dL — ABNORMAL HIGH (ref 70–99)
Glucose-Capillary: 126 mg/dL — ABNORMAL HIGH (ref 70–99)
Glucose-Capillary: 133 mg/dL — ABNORMAL HIGH (ref 70–99)
Glucose-Capillary: 115 mg/dL — ABNORMAL HIGH (ref 70–99)

## 2022-01-20 MED ORDER — MIDODRINE HCL 5 MG PO TABS
5.0000 mg | ORAL_TABLET | Freq: Once | ORAL | Status: AC
  Administered 2022-01-20: 5 mg via ORAL
  Filled 2022-01-20: qty 1

## 2022-01-20 MED ORDER — MIDODRINE HCL 5 MG PO TABS: 5.0000 mg | ORAL_TABLET | Freq: Three times a day (TID) | ORAL | Status: DC

## 2022-01-20 NOTE — Progress Notes (Signed)
PROGRESS NOTE    Dawn Bradford  CBS:[id number] DOB: 07-24-1936 DOA: 01/11/2022 PCP: System, Provider Not In    Brief Narrative:  This 86 year old female with history of ESRD on HD MWF, diastolic CHF, COPD, CAD, DM 2, HTN, HLD, hypothyroidism admitted for  generalized weakness and altered mental status worsening over the past 8 days.  Recently had her AV fistula decannulated with some drop in hemoglobin.  She also saw outpatient neurology where she was diagnosed with possible UTI and was prescribed Cipro but had not taken it.  Upon admission her hemoglobin was 6.7, EKG showed A-fib.  CT head was negative for acute pathology but showed chronic sinusitis, CT abdomen pelvis showed cardiomegaly with bilateral pleural effusion, 2.2 cm cystic pancreatic lesion.  She is admitted for metabolic encephalopathy secondary to UTI  and she is started on IV Rocephin.  Nephrology is consulted for continuation of hemodialysis.  Palliative care consulted to discuss goals of care.  5/17: Met with the patient's 2 daughters at bedside also in conjunction with nephrology and palliative care.  At this point we are pursuing aggressive care.  Patient had some issues with hemodialysis today.  Hypotension ports a temporary stop.  Patient's blood pressure recovered with small fluid bolus and addition of midodrine.  Assessment & Plan:   Principal Problem:   Metabolic encephalopathy Active Problems:   Acute lower UTI   End-stage renal disease on hemodialysis ([facility])   Iron deficiency anemia due to chronic blood loss   Essential hypertension   Acquired hypothyroidism   Anxiety and depression   Type 2 diabetes mellitus with chronic kidney disease, with long-term current use of insulin ([facility])  Metabolic Encephalopathy: UTI secondary to E. coli. Outpatient urine culture grew E. coli. Initiated  IV ceftriaxone. She became lethargic after HD on 01/14/22 CT of the head is negative for acute pathology.  No focal neurodeficits.    Patient received 9-day course of antibiotics Plan: No further antibiotics Frequent neurochecks Avoid sedatives, anticholinergics, narcotics Frequent reorienting measures Monitor vitals and fever curve   Iron deficiency anemia: Likely Chronic blood loss and macrocytosis. She has recent decannulation of her AV fistula.  Hemoccult is negative.   Status post 1 unit PRBC on 5/10, hemoglobin 9.0 >9.8 Folate deficiency, continue folic acid supplement. No indication for transfusion at this time   End-stage renal disease on hemodialysis [facility]) Nephrology consulted.  Status post HD 5/10.  EPO increased. Had HD on 01/17/22. Next hemodialysis 01/19/2022. Nephrology has already discussed with family about poor quality of life having dialysis. Palliative care consulted to discuss goals of care /continuation of dialysis. At this time still pursuing aggressive care In order to safely discharge patient with an outpatient HD chair time she will need to be able to tolerate hemodialysis sitting up.  Encourage nursing to get patient up in chair is much as possible.  Reattempt dialysis sitting up in chair on 5/19   History of CAD: s/p CABG History of CVA Continue aspirin and Crestor Continue Coreg, Avapro, isosorbide   Type 2 diabetes mellitus with chronic kidney disease, with long-term current use of insulin ([facility]) Sliding scale and Accu-Cheks. A1c = 5.7   Anxiety and depression Continue Prozac and as needed Klonopin.   Acquired hypothyroidism Continue Synthroid.   Essential hypertension Currently on Coreg, Avapro, isosorbide.  Also gets midodrine prior to HD. Decreased dose of Coreg with holding parameters, use holding parameters for Avapro and Imdur     Sacral decubitus ulcer stage II-present POA Turn patient  every 2 hourly and continue fall precautions.   DVT prophylaxis: SCD Code Status: DNR Family Communication: 2 daughters at bedside 5/17, daughter at bedside 5/18 Disposition Plan:  Status is: Inpatient Remains inpatient appropriate because: Acute metabolic encephalopathy.  ESRD on HD.  Will need to sit up in chair prior to discharge planning.   Level of care: Med-Surg  Consultants:  Nephrology Palliative care  Procedures:  None  Antimicrobials: None   Subjective: Seen and examined.  Lying in bed.  No visible distress.  1 daughter at bedside.  Patient pleasantly confused.  Objective: Vitals:   01/19/22 1550 01/19/22 1958 01/20/22 0510 01/20/22 0730  BP: (!) 113/49 (!) 108/49 (!) 120/56 138/61  Pulse: 76 85 100 (!) 108  Resp: '15 16 18 18  ' Temp: 97.8 F (36.6 C) 98.2 F (36.8 C) 98.7 F (37.1 C) 98.7 F (37.1 C)  TempSrc:      SpO2: 100% 100% 100% 99%  Weight:      Height:        Intake/Output Summary (Last 24 hours) at 01/20/2022 1224 Last data filed at 01/19/2022 1317 Gross per 24 hour  Intake --  Output 740 ml  Net -740 ml   Filed Weights   01/17/22 1301 01/19/22 1017 01/19/22 1349  Weight: 76.3 kg 77.6 kg 76.9 kg    Examination:  General exam: No acute distress.  Appears deconditioned.  Pleasantly confused Respiratory system: Lungs clear.  Normal work of breathing.  Room air Cardiovascular system: S1-S2, RRR, no murmurs, no pedal edema Gastrointestinal system: Soft, NT/ND, normal bowel sounds Central nervous system: Alert and oriented x1. No focal neurological deficits. Extremities: Symmetric 5 x 5 power. Skin: No rashes, lesions or ulcers Psychiatry: Judgement and insight appear impaired. Mood & affect confused.     Data Reviewed: I have personally reviewed following labs and imaging studies  CBC: Recent Labs  Lab 01/15/22 0544 01/16/22 0604 01/17/22 1753 01/18/22 0542 01/19/22 0547  WBC 10.1 9.8 9.8 9.7 8.7  HGB 8.3* 9.2* 8.5* 9.8* 9.8*  HCT 27.1* 29.8* 27.5* 32.2* 32.2*  MCV 105.9* 107.2* 103.8* 106.3* 106.6*  PLT 137* 168 135* 144* 264*   Basic Metabolic Panel: Recent Labs  Lab 01/15/22 0544 01/16/22 0604  01/17/22 1753 01/18/22 0542 01/19/22 0547  NA 138 137 134* 133* 133*  K 4.0 4.1 3.6 3.9 4.0  CL 104 103 97* 96* 96*  CO2 '29 29 30 29 25  ' GLUCOSE 101* 107* 120* 94 96  BUN '15 20 13 14 17  ' CREATININE 2.79* 3.35* 2.26* 2.59* 3.19*  CALCIUM 8.3* 8.6* 8.1* 8.3* 8.5*  MG 1.9 2.0 1.7 2.0 2.2  PHOS  --   --  2.7 3.6 4.1   GFR: Estimated Creatinine Clearance: 13.5 mL/min (A) (by C-G formula based on SCr of 3.19 mg/dL (H)). Liver Function Tests: No results for input(s): AST, ALT, ALKPHOS, BILITOT, PROT, ALBUMIN in the last 168 hours. No results for input(s): LIPASE, AMYLASE in the last 168 hours. No results for input(s): AMMONIA in the last 168 hours. Coagulation Profile: No results for input(s): INR, PROTIME in the last 168 hours. Cardiac Enzymes: No results for input(s): CKTOTAL, CKMB, CKMBINDEX, TROPONINI in the last 168 hours. BNP (last 3 results) No results for input(s): PROBNP in the last 8760 hours. HbA1C: No results for input(s): HGBA1C in the last 72 hours. CBG: Recent Labs  Lab 01/18/22 2001 01/19/22 0753 01/19/22 1552 01/19/22 2057 01/20/22 0732  GLUCAP 114* 121* 109* 120* 106*   Lipid Profile:  No results for input(s): CHOL, HDL, LDLCALC, TRIG, CHOLHDL, LDLDIRECT in the last 72 hours. Thyroid Function Tests: No results for input(s): TSH, T4TOTAL, FREET4, T3FREE, THYROIDAB in the last 72 hours. Anemia Panel: No results for input(s): VITAMINB12, FOLATE, FERRITIN, TIBC, IRON, RETICCTPCT in the last 72 hours. Sepsis Labs: No results for input(s): PROCALCITON, LATICACIDVEN in the last 168 hours.  Recent Results (from the past 240 hour(s))  MRSA Next Gen by PCR, Nasal     Status: None   Collection Time: 01/12/22  6:23 PM   Specimen: Nasal Mucosa; Nasal Swab  Result Value Ref Range Status   MRSA by PCR Next Gen NOT DETECTED NOT DETECTED Final    Comment: (NOTE) The GeneXpert MRSA Assay (FDA approved for NASAL specimens only), is one component of a comprehensive MRSA  colonization surveillance program. It is not intended to diagnose MRSA infection nor to guide or monitor treatment for MRSA infections. Test performance is not FDA approved in patients less than 88 years old. Performed at [facility], [address] [place], [place] [address]          Radiology Studies: No results found.      Scheduled Meds:  aspirin EC  81 mg Oral Daily   carvedilol  3.125 mg Oral Q supper   carvedilol  3.125 mg Oral Once per day on Sun Tue Thu Sat   epoetin (EPOGEN/PROCRIT) injection  10,000 Units Intravenous Q M,W,F-HD   FLUoxetine  40 mg Oral Daily   folic acid  1 mg Oral Daily   heparin injection (subcutaneous)  5,000 Units Subcutaneous Q8H   insulin aspart  0-15 Units Subcutaneous TID AC & HS   irbesartan  75 mg Oral Daily   isosorbide dinitrate  5 mg Oral QHS   levothyroxine  75 mcg Oral Q0600   mouth rinse  15 mL Mouth Rinse BID   midodrine  5 mg Oral Q M,W,F-HD   nystatin   Topical Q8H   pantoprazole sodium  40 mg Oral BID   polyethylene glycol  17 g Oral Daily   rosuvastatin  10 mg Oral Daily   Continuous Infusions:  sodium chloride Stopped (01/12/22 0243)   sodium chloride 10 mL/hr at 01/13/22 2309     LOS: 8 days    Sidney Ace, MD [facility]   If 7PM-7AM, please contact night-coverage  01/20/2022, 12:24 PM

## 2022-01-20 NOTE — TOC Progression Note (Addendum)
Transition of Care [facility]) - Progression Note    Patient Details  Name: Dawn Bradford MRN: [id number] Date of Birth: [birth year]  Transition of Care [facility]) CM/SW [place], LCSW Phone Number: 01/20/2022, 9:50 AM  Clinical Narrative:  Met with daughter [name] and caregiver at bedside. Daughter confirmed hoyer lift has been delivered. PCP is Lenise Arena, MD at [facility] in [place]. She is on prn oxygen at home through [facility]. Left voicemail for [place] representative. Will see what their orders say when he calls back. Daughter prefers to take her home in wheelchair van at discharge. Discussed home health vs home with hospice. Will follow along until plan determined.  11:23 am: Per [facility] representative, if patient needs continuous oxygen at discharge she will need new sats test and DME orders.  Expected Discharge Plan: [place] Barriers to Discharge: Continued Medical Work up  Expected Discharge Plan and Services Expected Discharge Plan: [place] In-house Referral: NA   Post Acute Care Choice: [place] arrangements for the past 2 months: Single Family Home                 DME Arranged:  (hoyar lift) DME Agency: [employer] Date DME Agency Contacted: 01/13/22 Time DME Agency Contacted: 29 Representative spoke with at DME Agency: Suanne Marker HH Arranged: RN, PT, OT [facility] Agency: [place] ([place]) Date [place]: 01/13/22 Time [place]: 30 Representative spoke with at [place]: [place] ([place]) Interventions    Readmission Risk Interventions     View : No data to display.

## 2022-01-20 NOTE — Progress Notes (Signed)
Occupational Therapy Treatment Patient Details Name: Dawn Bradford MRN: [id number] DOB: Mar 30, 1936 Today's Date: 01/20/2022   History of present illness Pt is an 86 year old with history of ESRD on HD MWF, diastolic CHF, COPD, CAD, DM 2, HTN, HLD, hypothyroidism admitted for weakness and altered mental status worsening over the past 8 days.  Recently had her AV fistula decannulated with some drop in hemoglobin.  She also saw outpatient neurology where she was diagnosed with possible UTI.   OT comments  Pt seen for OT treatment on this date. Upon arrival to room pt reclined in bed completing pericare with nursing, family requesting transfer to chair to demonstrate tolerance for HD. Pt is pleasant and follows 1 step commands with increased time. Initiates BLE movement towards EOB but ultimately requires MAX A sup>sit. Fair static sitting balance, intermittent MIN A. TOTAL A bed>chair squat pivot t/f. Reclined in chair: BP 112/59, MAP 74, HR 113. Pt making good progress toward goals. Will continue to follow POC. Discharge recommendation remains appropriate.     Recommendations for follow up therapy are one component of a multi-disciplinary discharge planning process, led by the attending physician.  Recommendations may be updated based on patient status, additional functional criteria and insurance authorization.    Follow Up Recommendations  Home health OT    Assistance Recommended at Discharge Frequent or constant Supervision/Assistance  Patient can return home with the following  Two people to help with walking and/or transfers;Two people to help with bathing/dressing/bathroom   Equipment Recommendations  Hospital bed;Other (comment) (hoyer lift)    Recommendations for Other Services      Precautions / Restrictions Precautions Precautions: Fall Restrictions Weight Bearing Restrictions: No       Mobility Bed Mobility Overal bed mobility: Needs Assistance Bed Mobility: Supine to  Sit     Supine to sit: Max assist          Transfers Overall transfer level: Needs assistance   Transfers: Bed to chair/wheelchair/BSC     Squat pivot transfers: Total assist             Balance Overall balance assessment: Needs assistance Sitting-balance support: Single extremity supported, Feet supported Sitting balance-Leahy Scale: Fair                                     ADL either performed or assessed with clinical judgement   ADL Overall ADL's : Needs assistance/impaired                                       General ADL Comments: MAX A don B socks at bed level. TOTAL A for simulated BSC t/f      Cognition Arousal/Alertness: Awake/alert Behavior During Therapy: Flat affect Overall Cognitive Status: History of cognitive impairments - at baseline                                 General Comments: follows 1 step commands              General Comments Reclined in chair: BP 112/59, MAP 74, HR 113    Pertinent Vitals/ Pain       Pain Assessment Pain Assessment: Faces Faces Pain Scale: Hurts little more Pain Location: "all over" with mobility Pain Descriptors /  Indicators: Discomfort, Dull Pain Intervention(s): Limited activity within patient's tolerance, Repositioned   Frequency  Min 2X/week        Progress Toward Goals  OT Goals(current goals can now be found in the care plan section)  Progress towards OT goals: Progressing toward goals  Acute Rehab OT Goals Patient Stated Goal: to go home OT Goal Formulation: With family Time For Goal Achievement: 01/27/22 Potential to Achieve Goals: Poor ADL Goals Pt Will Perform Grooming: with min guard assist;sitting Pt Will Perform Upper Body Bathing: with min assist;sitting Pt Will Transfer to Toilet: with mod assist;with +2 assist;stand pivot transfer;bedside commode  Plan Discharge plan remains appropriate;Frequency remains appropriate     Co-evaluation                 AM-PAC OT "6 Clicks" Daily Activity     Outcome Measure   Help from another person eating meals?: A Little Help from another person taking care of personal grooming?: A Lot Help from another person toileting, which includes using toliet, bedpan, or urinal?: A Lot Help from another person bathing (including washing, rinsing, drying)?: A Lot Help from another person to put on and taking off regular upper body clothing?: A Lot Help from another person to put on and taking off regular lower body clothing?: A Lot 6 Click Score: 13    End of Session    OT Visit Diagnosis: Other abnormalities of gait and mobility (R26.89);Muscle weakness (generalized) (M62.81)   Activity Tolerance Patient limited by fatigue;Patient tolerated treatment well   Patient Left in chair;with call bell/phone within reach;with nursing/sitter in room;with family/visitor present   Nurse Communication Mobility status        Time: 9628-3662 OT Time Calculation (min): 21 min  Charges: OT General Charges $OT Visit: 1 Visit OT Treatments $Self Care/Home Management : 8-22 mins  Dessie Coma, M.S. OTR/L  01/20/22, 2:34 PM  ascom [phone]

## 2022-01-20 NOTE — Progress Notes (Signed)
Physical Therapy Treatment Patient Details Name: Dawn Bradford MRN: [id number] DOB: Jan 05, 1936 Today's Date: 01/20/2022   History of Present Illness Pt is an 86 year old with history of ESRD on HD MWF, diastolic CHF, COPD, CAD, DM 2, HTN, HLD, hypothyroidism admitted for weakness and altered mental status worsening over the past 8 days.  Recently had her AV fistula decannulated with some drop in hemoglobin.  She also saw outpatient neurology where she was diagnosed with possible UTI.    PT Comments    Pt lethargic but did attempt to follow some commands with extra cuing and physical assist.  Ultimately pt required near total assist with functional tasks including sit to sup and squat pivot transfer from chair to bed.  Pt will require dependent level of care upon discharge and would benefit from a hoyer lift for caregiver assist and safety.  Pt will benefit from HHPT upon discharge to safely address deficits listed in patient problem list for decreased caregiver assistance and caregiver training.   Recommendations for follow up therapy are one component of a multi-disciplinary discharge planning process, led by the attending physician.  Recommendations may be updated based on patient status, additional functional criteria and insurance authorization.  Follow Up Recommendations  Home health PT     Assistance Recommended at Discharge Frequent or constant Supervision/Assistance  Patient can return home with the following Two people to help with walking and/or transfers;Assistance with feeding;Assist for transportation;Assistance with cooking/housework;Direct supervision/assist for financial management;Help with stairs or ramp for entrance;Direct supervision/assist for medications management;A lot of help with bathing/dressing/bathroom   Equipment Recommendations  Other (comment) (hoyer lift)    Recommendations for Other Services       Precautions / Restrictions Precautions Precautions:  Fall Restrictions Weight Bearing Restrictions: No     Mobility  Bed Mobility Overal bed mobility: Needs Assistance Bed Mobility: Sit to Supine       Sit to supine: Max assist, +2 for physical assistance Rolling: +2 Mod A   General bed mobility comments: +2 Max A for BLE and trunk control during sit to sup    Transfers Overall transfer level: Needs assistance   Transfers: Bed to chair/wheelchair/BSC       Squat pivot transfers: Max assist     General transfer comment: Max verbal cues for sequencing with physical assist for arm placement and increased trunk flexion    Ambulation/Gait               General Gait Details: unable to amb at baseline   Stairs             Wheelchair Mobility    Modified Rankin (Stroke Patients Only)       Balance Overall balance assessment: Needs assistance Sitting-balance support: Single extremity supported, Feet supported Sitting balance-Leahy Scale: Poor     Standing balance support: Bilateral upper extremity supported Standing balance-Leahy Scale: Poor                              Cognition Arousal/Alertness: Lethargic Behavior During Therapy: Flat affect Overall Cognitive Status: History of cognitive impairments - at baseline                                          Exercises      General Comments General comments (skin integrity, edema, etc.): Reclined in chair: BP  112/59, MAP 74, HR 113      Pertinent Vitals/Pain Pain Assessment Pain Assessment: No/denies pain    Home Living                          Prior Function            PT Goals (current goals can now be found in the care plan section) Progress towards PT goals: Progressing toward goals    Frequency    Min 2X/week      PT Plan Current plan remains appropriate    Co-evaluation              AM-PAC PT "6 Clicks" Mobility   Outcome Measure  Help needed turning from your back to  your side while in a flat bed without using bedrails?: A Lot Help needed moving from lying on your back to sitting on the side of a flat bed without using bedrails?: Total Help needed moving to and from a bed to a chair (including a wheelchair)?: Total Help needed standing up from a chair using your arms (e.g., wheelchair or bedside chair)?: Total Help needed to walk in hospital room?: Total Help needed climbing 3-5 steps with a railing? : Total 6 Click Score: 7    End of Session Equipment Utilized During Treatment: Gait belt Activity Tolerance: Patient tolerated treatment well Patient left: in bed;with bed alarm set;with nursing/sitter in room;with family/visitor present;with call bell/phone within reach Nurse Communication: Mobility status PT Visit Diagnosis: Other abnormalities of gait and mobility (R26.89)     Time: 1694-5038 PT Time Calculation (min) (ACUTE ONLY): 17 min  Charges:  $Therapeutic Activity: 8-22 mins                    D. Scott Jatziry Wechter PT, DPT 01/20/22, 3:56 PM

## 2022-01-20 NOTE — Progress Notes (Addendum)
Assessed RUE for PIV.  Severe bruising noted elbow distally.  Daughter at bedside refusing for further sticks at this time. Offered to Assess with ultrasound, also declined. Pt moaning to 'leave her alone and does not want all this'. Santiago Glad RN at bedside witnessing conversation and assessment.

## 2022-01-20 NOTE — Progress Notes (Signed)
[facility] hospital liaison note:   Patient had dialysis 5.17. Per chart review, plan is for patient to return home with [facility] services to follow.   Hospice approval is pending at this time as family continues to finalize care (comfort vs. Aggressive/life extending).   Liaison will continue to follow and await family decision.    Thank you for the opportunity to involved in the care of this patient and her family.   Daphene Calamity, [facility] [phone]

## 2022-01-20 NOTE — Progress Notes (Signed)
[facility]  ROUNDING NOTE   Subjective:  Patient is a 86 year old female with history of ESRD on hemodialysis Monday Wednesday Friday, diastolic CHF, COPD, CAD, DM 2, hypertension, hyperlipidemia, hypothyroidism admitted for generalized weakness and altered mental status.   Update  Patient seen resting quietly Daughter and personal sitter at bedside. Daughter states her mother has days like this where she sleeps late. Appetite remains appropriate Daughter states she does not feel comfortable with discharge until mother is able to sit in chair   Objective:  Vital signs in last 24 hours:  Temp:  [97.8 F (36.6 C)-98.7 F (37.1 C)] 98.7 F (37.1 C) (05/18 0730) Pulse Rate:  [56-108] 108 (05/18 0730) Resp:  [14-30] 18 (05/18 0730) BP: (62-138)/(36-67) 138/61 (05/18 0730) SpO2:  [99 %-100 %] 99 % (05/18 0730) Weight:  [76.9 kg] 76.9 kg (05/17 1349)  Weight change:  Filed Weights   01/17/22 1301 01/19/22 1017 01/19/22 1349  Weight: 76.3 kg 77.6 kg 76.9 kg    Intake/Output: I/O last 3 completed shifts: In: -  Out: 740 [Other:740]   Intake/Output this shift:  No intake/output data recorded.  Physical Exam: General: No acute distress  Head: Normocephalic, atraumatic. Moist oral mucosal membranes  Eyes: Anicteric  Lungs:  Clear to auscultation, normal effort  Heart: S1S2 no rubs  Abdomen:  Soft, nontender, bowel sounds present  Extremities:  Trace edema, no cynosis.  Neurologic: Awake, alert, following commands  Skin: No acute rash  Access: LUE AVF    Basic Metabolic Panel: Recent Labs  Lab 01/15/22 0544 01/16/22 0604 01/17/22 1753 01/18/22 0542 01/19/22 0547  NA 138 137 134* 133* 133*  K 4.0 4.1 3.6 3.9 4.0  CL 104 103 97* 96* 96*  CO2 29 29 30 29 25   GLUCOSE 101* 107* 120* 94 96  BUN 15 20 13 14 17   CREATININE 2.79* 3.35* 2.26* 2.59* 3.19*  CALCIUM 8.3* 8.6* 8.1* 8.3* 8.5*  MG 1.9 2.0 1.7 2.0 2.2  PHOS  --   --  2.7 3.6 4.1     Liver  Function Tests: No results for input(s): AST, ALT, ALKPHOS, BILITOT, PROT, ALBUMIN in the last 168 hours.  No results for input(s): LIPASE, AMYLASE in the last 168 hours. No results for input(s): AMMONIA in the last 168 hours.  CBC: Recent Labs  Lab 01/15/22 0544 01/16/22 0604 01/17/22 1753 01/18/22 0542 01/19/22 0547  WBC 10.1 9.8 9.8 9.7 8.7  HGB 8.3* 9.2* 8.5* 9.8* 9.8*  HCT 27.1* 29.8* 27.5* 32.2* 32.2*  MCV 105.9* 107.2* 103.8* 106.3* 106.6*  PLT 137* 168 135* 144* 121*     Cardiac Enzymes: No results for input(s): CKTOTAL, CKMB, CKMBINDEX, TROPONINI in the last 168 hours.  BNP: Invalid input(s): POCBNP  CBG: Recent Labs  Lab 01/18/22 2001 01/19/22 0753 01/19/22 1552 01/19/22 2057 01/20/22 0732  GLUCAP 114* 121* 109* 120* 106*     Microbiology: Results for orders placed or performed during the hospital encounter of 01/11/22  MRSA Next Gen by PCR, Nasal     Status: None   Collection Time: 01/12/22  6:23 PM   Specimen: Nasal Mucosa; Nasal Swab  Result Value Ref Range Status   MRSA by PCR Next Gen NOT DETECTED NOT DETECTED Final    Comment: (NOTE) The GeneXpert MRSA Assay (FDA approved for NASAL specimens only), is one component of a comprehensive MRSA colonization surveillance program. It is not intended to diagnose MRSA infection nor to guide or monitor treatment for MRSA infections. Test performance  is not FDA approved in patients less than 89 years old. Performed at [facility], [address] [place], [place] [address]     Coagulation Studies: No results for input(s): LABPROT, INR in the last 72 hours.  Urinalysis: No results for input(s): COLORURINE, LABSPEC, PHURINE, GLUCOSEU, HGBUR, BILIRUBINUR, KETONESUR, PROTEINUR, UROBILINOGEN, NITRITE, LEUKOCYTESUR in the last 72 hours.  Invalid input(s): APPERANCEUR    Imaging: No results found.   Medications:    sodium chloride Stopped (01/12/22 0243)   sodium chloride 10 mL/hr at  01/13/22 2309    aspirin EC  81 mg Oral Daily   carvedilol  3.125 mg Oral Q supper   carvedilol  3.125 mg Oral Once per day on Sun Tue Thu Sat   epoetin (EPOGEN/PROCRIT) injection  10,000 Units Intravenous Q M,W,F-HD   FLUoxetine  40 mg Oral Daily   folic acid  1 mg Oral Daily   heparin injection (subcutaneous)  5,000 Units Subcutaneous Q8H   insulin aspart  0-15 Units Subcutaneous TID AC & HS   irbesartan  75 mg Oral Daily   isosorbide dinitrate  5 mg Oral QHS   levothyroxine  75 mcg Oral Q0600   mouth rinse  15 mL Mouth Rinse BID   midodrine  5 mg Oral Q M,W,F-HD   nystatin   Topical Q8H   pantoprazole sodium  40 mg Oral BID   polyethylene glycol  17 g Oral Daily   rosuvastatin  10 mg Oral Daily   sodium chloride, acetaminophen **OR** acetaminophen, albuterol, clonazePAM, lidocaine-prilocaine, magnesium hydroxide, nystatin, ondansetron **OR** ondansetron (ZOFRAN) IV, oxybutynin, senna-docusate, traZODone  Assessment/ Plan:  86 y.o. female with a past medical history of end-stage renal disease on hemodialysis on Monday Wednesday Friday schedule, diastolic CHF, COPD, coronary artery disease, diabetes mellitus, hypertension, hyperlipidemia, hypothyroidism admitted for weakness and altered mental status. She is currently undergoing treatment for metabolic encephalopathy and UTI secondary to E. Coli. Her mental status still waxing and waning.   End-stage renal disease, on dialysis Monday Wednesday Friday schedule.  It was determined during the family meeting that patient and family will proceed with dialysis and reevaluate with decline. Patient had dialysis yesterday, tolerated fair.  UF 740 mL achieved.  Treatment terminated 8 minutes early due to hypotension and tachycardia.  Patient received normal saline bolus of 200 mL along with scheduled rinse back.  Next treatment scheduled for Friday with patient seated in recliner.  Hypotension.On midodrine.  Adjustments to Coreg Avapro and Imdur  this admission by primary care team.   Experienced hypotension during dialysis yesterday received a normal saline bolus.  Blood pressure currently 138/61.  Anemia of chronic disease.  Monitor H&H closely.  We will obtain updated labs tomorrow with dialysis  Secondary hyperparathyroidism/hyperphosphatemia.   Will continue to monitor bone minerals during this admission.  Metabolic encephalopathy most likely infectious etiology secondary to UTI.  CT head negative for any acute pathology.   Antibiotic therapy completed    LOS: 8   5/18/202312:21 PM

## 2022-01-21 DIAGNOSIS — Z7189 Other specified counseling: Secondary | ICD-10-CM | POA: Diagnosis not present

## 2022-01-21 DIAGNOSIS — G9341 Metabolic encephalopathy: Secondary | ICD-10-CM | POA: Diagnosis not present

## 2022-01-21 LAB — GLUCOSE, CAPILLARY: Glucose-Capillary: 104 mg/dL — ABNORMAL HIGH (ref 70–99)

## 2022-01-21 MED ORDER — CLONAZEPAM 0.5 MG PO TABS
0.5000 mg | ORAL_TABLET | ORAL | Status: DC | PRN
  Administered 2022-01-21 – 2022-01-22 (×2): 0.5 mg via ORAL
  Filled 2022-01-21 (×2): qty 1

## 2022-01-21 MED ORDER — OXYCODONE HCL 5 MG/5ML PO SOLN
5.0000 mg | ORAL | Status: DC | PRN
  Filled 2022-01-21: qty 5

## 2022-01-21 NOTE — Plan of Care (Signed)
  Problem: Safety: Goal: Ability to remain free from injury will improve Outcome: Progressing   

## 2022-01-21 NOTE — TOC Progression Note (Signed)
Transition of Care [facility]) - Progression Note    Patient Details  Name: Dawn Bradford MRN: [id number] Date of Birth: 26-Oct-1935  Transition of Care [facility]) CM/SW [place], LCSW Phone Number: 01/21/2022, 4:16 PM  Clinical Narrative:  Per palliative request, left private duty nursing list in the room. Family was in the [place] with [facility] representative so gave it to her caregiver. She said her agency has nurses available and family would probably be reaching to them.   Expected Discharge Plan: [place] Barriers to Discharge: Continued Medical Work up  Expected Discharge Plan and Services Expected Discharge Plan: [place] In-house Referral: NA   Post Acute Care Choice: [place] arrangements for the past 2 months: Single Family Home                 DME Arranged:  (hoyar lift) DME Agency: [employer] Date DME Agency Contacted: 01/13/22 Time DME Agency Contacted: 52 Representative spoke with at DME Agency: Suanne Marker HH Arranged: RN, PT, OT [facility] Agency: [place] ([place]) Date [place]: 01/13/22 Time [place]: 24 Representative spoke with at [place]: [place] ([place]) Interventions    Readmission Risk Interventions     View : No data to display.

## 2022-01-21 NOTE — Progress Notes (Signed)
OT Cancellation Note  Patient Details Name: Dawn Bradford MRN: [id number] DOB: Nov 13, 1935   Cancelled Treatment:    Reason Eval/Treat Not Completed: Other (comment). Per Pallative, pt is going on comfort care measures. OT to SIGN OFF at this time.   Darleen Crocker, MS, OTR/L , CBIS ascom [phone]  01/21/22, 1:38 PM

## 2022-01-21 NOTE — Progress Notes (Signed)
PT Cancellation Note  Patient Details Name: NELA BASCOM MRN: [id number] DOB: 1936-06-30   Cancelled Treatment:    Reason Eval/Treat Not Completed: Other (comment). Per discussion with care team, pt now on comfort care. Will dc current order.   Malyssa Maris 01/21/2022, 2:19 PM Greggory Stallion, PT, DPT, GCS [phone]

## 2022-01-21 NOTE — Progress Notes (Signed)
PROGRESS NOTE    Dawn Bradford  ZGY:[id number] DOB: 11/15/1935 DOA: 01/11/2022 PCP: System, Provider Not In    Brief Narrative:  This 86 year old female with history of ESRD on HD MWF, diastolic CHF, COPD, CAD, DM 2, HTN, HLD, hypothyroidism admitted for  generalized weakness and altered mental status worsening over the past 8 days.  Recently had her AV fistula decannulated with some drop in hemoglobin.  She also saw outpatient neurology where she was diagnosed with possible UTI and was prescribed Cipro but had not taken it.  Upon admission her hemoglobin was 6.7, EKG showed A-fib.  CT head was negative for acute pathology but showed chronic sinusitis, CT abdomen pelvis showed cardiomegaly with bilateral pleural effusion, 2.2 cm cystic pancreatic lesion.  She is admitted for metabolic encephalopathy secondary to UTI  and she is started on IV Rocephin.  Nephrology is consulted for continuation of hemodialysis.  Palliative care consulted to discuss goals of care.  5/17: Met with the patient's 2 daughters at bedside also in conjunction with nephrology and palliative care.  At this point we are pursuing aggressive care.  Patient had some issues with hemodialysis today.  Hypotension ports a temporary stop.  Patient's blood pressure recovered with small fluid bolus and addition of midodrine.  5/19: Hypotension noted yesterday.  Midodrine 5 mg 3 times daily started.  Discussed with nephrology.  Doubtful whether patient can tolerate hemodialysis at this time.  She is increasingly agitated worse when sitting up.  Assessment & Plan:   Principal Problem:   Metabolic encephalopathy Active Problems:   Acute lower UTI   End-stage renal disease on hemodialysis (HCC)   Iron deficiency anemia due to chronic blood loss   Essential hypertension   Acquired hypothyroidism   Anxiety and depression   Type 2 diabetes mellitus with chronic kidney disease, with long-term current use of insulin (HCC)  Metabolic  Encephalopathy: UTI secondary to E. coli. Outpatient urine culture grew E. coli. Initiated  IV ceftriaxone. She became lethargic after HD on 01/14/22 CT of the head is negative for acute pathology.  No focal neurodeficits.   Patient received 9-day course of antibiotics Plan: No further antibiotics Frequent neurochecks Avoid sedatives, anticholinergics, narcotics Frequent reorienting measures Monitor vitals and fever curve   Iron deficiency anemia: Likely Chronic blood loss and macrocytosis. She has recent decannulation of her AV fistula.  Hemoccult is negative.   Status post 1 unit PRBC on 5/10, hemoglobin 9.0 >9.8 Folate deficiency, continue folic acid supplement. No indication for transfusion at this time Defer lab check   End-stage renal disease on hemodialysis [facility]) Nephrology consulted.  Status post HD 5/10.  EPO increased. Had HD on 01/17/22. Next hemodialysis 01/19/2022. Nephrology has already discussed with family about poor quality of life having dialysis. Palliative care consulted to discuss goals of care /continuation of dialysis. At this time still pursuing aggressive care Palliative care and nephrology to speak with the patient and family in regards to continuation of hemodialysis versus transition to comfort measures   History of CAD: s/p CABG History of CVA Continue aspirin and Crestor Continue Coreg, Avapro, isosorbide  New onset atrial fibrillation Noted on telemetry 5/18 Currently rate controlled with no chest pain or palpitations Continue Coreg Poor candidate for anticoagulation   Type 2 diabetes mellitus with chronic kidney disease, with long-term current use of insulin (HCC) Sliding scale and Accu-Cheks. A1c = 5.7   Anxiety and depression Continue Prozac and as needed Klonopin.   Acquired hypothyroidism Continue Synthroid.  Essential hypertension Hypotension Continue Coreg at reduced dose.  Remainder of antihypertensives have been held.  Midodrine  5 mg 3 times daily initiated     Sacral decubitus ulcer stage II-present POA Turn patient every 2 hourly and continue fall precautions.   DVT prophylaxis: SCD Code Status: DNR Family Communication: 2 daughters at bedside 5/17, daughter at bedside 5/18, daughter at bedside 5/19 Disposition Plan: Status is: Inpatient Remains inpatient appropriate because: Acute metabolic encephalopathy.  ESRD on HD.  Will need to sit up in chair prior to discharge planning..   Level of care: Med-Surg  Consultants:  Nephrology Palliative care  Procedures:  None  Antimicrobials: None   Subjective: Seen and examined.  Sitting up in bed.  Attempting to eat with assistance of caregiver.  Appears uncomfortable.  Moaning out.  Objective: Vitals:   01/20/22 2013 01/20/22 2149 01/21/22 0500 01/21/22 0823  BP: 117/60 (!) 142/65 127/72 (!) 147/72  Pulse: 85 99 72 72  Resp: '20  20 17  ' Temp: 98.7 F (37.1 C)  97.9 F (36.6 C) 97.6 F (36.4 C)  TempSrc:    Oral  SpO2: 100% 98% 99% 100%  Weight:      Height:        Intake/Output Summary (Last 24 hours) at 01/21/2022 1106 Last data filed at 01/21/2022 0700 Gross per 24 hour  Intake 120 ml  Output 101 ml  Net 19 ml   Filed Weights   01/17/22 1301 01/19/22 1017 01/19/22 1349  Weight: 76.3 kg 77.6 kg 76.9 kg    Examination:  General exam: No acute distress.  Deconditioned.  Appears uncomfortable Respiratory system: Lungs clear.  Normal work of breathing.  Room air Cardiovascular system: S1-S2, RRR, no murmurs, no pedal edema Gastrointestinal system: Soft, NT/ND, normal bowel sounds Central nervous system: Unable to assess orientation. Extremities: Symmetric 5 x 5 power. Skin: No rashes, lesions or ulcers Psychiatry: Judgement and insight appear impaired. Mood & affect confused/agitated.     Data Reviewed: I have personally reviewed following labs and imaging studies  CBC: Recent Labs  Lab 01/15/22 0544 01/16/22 0604  01/17/22 1753 01/18/22 0542 01/19/22 0547  WBC 10.1 9.8 9.8 9.7 8.7  HGB 8.3* 9.2* 8.5* 9.8* 9.8*  HCT 27.1* 29.8* 27.5* 32.2* 32.2*  MCV 105.9* 107.2* 103.8* 106.3* 106.6*  PLT 137* 168 135* 144* 932*   Basic Metabolic Panel: Recent Labs  Lab 01/15/22 0544 01/16/22 0604 01/17/22 1753 01/18/22 0542 01/19/22 0547  NA 138 137 134* 133* 133*  K 4.0 4.1 3.6 3.9 4.0  CL 104 103 97* 96* 96*  CO2 '29 29 30 29 25  ' GLUCOSE 101* 107* 120* 94 96  BUN '15 20 13 14 17  ' CREATININE 2.79* 3.35* 2.26* 2.59* 3.19*  CALCIUM 8.3* 8.6* 8.1* 8.3* 8.5*  MG 1.9 2.0 1.7 2.0 2.2  PHOS  --   --  2.7 3.6 4.1   GFR: Estimated Creatinine Clearance: 13.5 mL/min (A) (by C-G formula based on SCr of 3.19 mg/dL (H)). Liver Function Tests: No results for input(s): AST, ALT, ALKPHOS, BILITOT, PROT, ALBUMIN in the last 168 hours. No results for input(s): LIPASE, AMYLASE in the last 168 hours. No results for input(s): AMMONIA in the last 168 hours. Coagulation Profile: No results for input(s): INR, PROTIME in the last 168 hours. Cardiac Enzymes: No results for input(s): CKTOTAL, CKMB, CKMBINDEX, TROPONINI in the last 168 hours. BNP (last 3 results) No results for input(s): PROBNP in the last 8760 hours. HbA1C: No results for input(s):  HGBA1C in the last 72 hours. CBG: Recent Labs  Lab 01/20/22 0732 01/20/22 1224 01/20/22 1656 01/20/22 2117 01/21/22 0818  GLUCAP 106* 126* 133* 115* 104*   Lipid Profile: No results for input(s): CHOL, HDL, LDLCALC, TRIG, CHOLHDL, LDLDIRECT in the last 72 hours. Thyroid Function Tests: No results for input(s): TSH, T4TOTAL, FREET4, T3FREE, THYROIDAB in the last 72 hours. Anemia Panel: No results for input(s): VITAMINB12, FOLATE, FERRITIN, TIBC, IRON, RETICCTPCT in the last 72 hours. Sepsis Labs: No results for input(s): PROCALCITON, LATICACIDVEN in the last 168 hours.  Recent Results (from the past 240 hour(s))  MRSA Next Gen by PCR, Nasal     Status: None    Collection Time: 01/12/22  6:23 PM   Specimen: Nasal Mucosa; Nasal Swab  Result Value Ref Range Status   MRSA by PCR Next Gen NOT DETECTED NOT DETECTED Final    Comment: (NOTE) The GeneXpert MRSA Assay (FDA approved for NASAL specimens only), is one component of a comprehensive MRSA colonization surveillance program. It is not intended to diagnose MRSA infection nor to guide or monitor treatment for MRSA infections. Test performance is not FDA approved in patients less than 32 years old. Performed at [facility], [address], [place], [place] [address]          Radiology Studies: US Venous Img Upper Uni Right(DVT)  Result Date: 01/20/2022 CLINICAL DATA:  Swelling, pain and bruising. EXAM: RIGHT UPPER EXTREMITY VENOUS DOPPLER ULTRASOUND TECHNIQUE: Gray-scale sonography with graded compression, as well as color Doppler and duplex ultrasound were performed to evaluate the upper extremity deep venous system from the level of the subclavian vein and including the jugular, axillary, basilic, radial, ulnar and upper cephalic vein. Spectral Doppler was utilized to evaluate flow at rest and with distal augmentation maneuvers. COMPARISON:  Chest XR, 01/12/2022. FINDINGS: VENOUS Normal compressibility of the RIGHT internal jugular, subclavian, axillary, cephalic, basilic, brachial, radial and ulnar veins. No filling defects to suggest DVT on grayscale or color Doppler imaging. Doppler waveforms show normal direction of venous flow, normal respiratory plasticity and response to augmentation. Limited views of the contralateral subclavian vein are unremarkable. OTHER No evidence of superficial thrombophlebitis or abnormal fluid collection. Limitations: none IMPRESSION: No evidence of DVT within the RIGHT upper extremity. Michaelle Birks, MD [facility] Radiology Electronically Signed   By: Michaelle Birks M.D.   On: 01/20/2022 16:23         Scheduled Meds:  aspirin EC  81 mg Oral Daily   carvedilol  3.125 mg Oral Q supper   carvedilol  3.125 mg Oral Once per day on Sun Tue Thu Sat   epoetin (EPOGEN/PROCRIT) injection  10,000 Units Intravenous Q M,W,F-HD   FLUoxetine  40 mg Oral Daily   folic acid  1 mg Oral Daily   heparin injection (subcutaneous)  5,000 Units Subcutaneous Q8H   insulin aspart  0-15 Units Subcutaneous TID AC & HS   isosorbide dinitrate  5 mg Oral QHS   levothyroxine  75 mcg Oral Q0600   mouth rinse  15 mL Mouth Rinse BID   midodrine  5 mg Oral Q M,W,F-HD   midodrine  5 mg Oral TID WC   nystatin   Topical Q8H   pantoprazole sodium  40 mg Oral BID   polyethylene glycol  17 g Oral Daily   rosuvastatin  10 mg Oral Daily   Continuous Infusions:     LOS: 9 days    Niquita Digioia B Priscella Mann,  MD [facility]   If 7PM-7AM, please contact night-coverage  01/21/2022, 11:06 AM

## 2022-01-21 NOTE — Progress Notes (Signed)
Manufacturing engineer hospital liaison note:  Lengthy visit held with patient's daughter(s) (Dawn & Dawn Bradford) as family as decided that they no longer wish for patient to continue dialysis and have chosen comfort care. Active listening provided and all questions answered. Family plans to hire 24/7 RN to provide comfort medications for patient. Family denied any DME needs as they report all needed equipment is already in the home.    Hospice eligibility is approved. Admission is scheduled for Sunday.   Please send signed and completed DNR with patient/family upon discharge. Please provide prescriptions at discharge as needed to ensure ongoing symptom management and a transport packet.   [facility] information and contact numbers given to family and above information shared with TOC.    Please call with any questions/concerns.    Thank you for the opportunity to participate in this patient's care    Thank you for the opportunity to involved in the care of this patient and her family.   Daphene Calamity, [facility] [employer] [phone]

## 2022-01-21 NOTE — Progress Notes (Addendum)
Daily Progress Note   Patient Name: Dawn Bradford       Date: 01/21/2022 DOB: February 12, 1936  Age: 86 y.o. MRN#: [id number] Attending Physician: Sidney Ace, MD Primary Care Physician: System, Provider Not In Admit Date: 01/11/2022  Reason for Consultation/Follow-up:  Establishing goals of care  Subjective: Notes reviewed.  In to bedside.  [name] and Deanna  are at bedside currently.  We discussed her restlessness overnight and this morning.  Nephrology in to discuss dialysis moving forward.  Discussion to hold dialysis as patient is not tolerating very well.  Spoke with daughters further as well as Deanna's husband who arrived.   We discussed pain and suffering.  We discussed patient's wishes.  We discussed the advance directive.  Of note they say the living will was created after patient had started dialysis, so the wishes were made based on current care, and not made in regards to hypothetical situations that may or may not happen in the future.  We discussed the last few days, and discussed that she does not like to sit upright, becoming agitated, and would like to be reclined.  We discussed the need for sitting upright in a wheelchair van earlier when nephrology was present.  They discuss hospice, and questions were answered.  They are unsure of care moving forward.  Deanna states that she feels she is "being selfish", but is having difficulty with letting her mother go. Patient is currently confused and has baseline dementia. In an effort to help provide peace for the family I attempted to discern patient's wishes.  I asked patient about stopping the sticks, pokes, prods, as well as dialysis, and getting her home to spend the time that she has left with her family until she goes to heaven.   Patient says yes to this.  [name] is concerned that perhaps patient does not understand the consequences of her decision.  I asked patient if she was a dialysis patient, and she said "I am".  I asked the patient what would happen if she stopped dialysis, and she did not answer.  I asked her if she would die if she has no further dialysis?  Patient said "yes I would".  I asked her if she wanted to stop dialysis, and she said "yes". I asked her if she wanted to go  home and remain comfortable until she dies and goes to heaven to be with her husband and she said "yes".    We discussed transitioning to comfort care.  We discussed having hospice care, and where that should be offered.  Discussed hospice facility.  Discussed home with hospice.  Family discusses having the means to hire a private duty nurse.  Deanna wants her mother to be at home and comfortable.  Deanna's husband agrees that patient should go home with hospice.  They state they will be able to hire a private duty nurse to administer comfort medications.   Deanna called older Sister Grayland Ormond who is H POA.  Grayland Ormond states she will go with what ever her sisters want to do.  As we were completing electronic MOST form Deanna voices concern about the decision.  We discussed the living will.  We discussed her wishes.  We discussed her request for staff to stop doing things.  We discussed last dialysis session, and risk for decline.  We discussed her discomfort with sitting upright.  We discussed her p.o. intake over the past few days.  Discussed her need for being physically moved from place to place as she is unable to walk.  Daughter discusses dignity. Deanna states she does not want to see her mother suffer. Deanna states she will honor the plan for hospice at home.    We discussed their mother's agitation, and a comfortable, and peaceful transition out of this world.  Patient called out to [place] and smiled and told her family several times thank  you.  I completed a MOST form today with daughter Benjamine Mola  electronically while on speaker phone, and while daughter [name] and [name] were at bedside. A copy was placed in the chart. The patient outlined their wishes for the following treatment decisions:  Cardiopulmonary Resuscitation: Do Not Attempt Resuscitation (DNR/No CPR)  Medical Interventions: Comfort Measures: Keep clean, warm, and dry. Use medication by any route, positioning, wound care, and other measures to relieve pain and suffering. Use oxygen, suction and manual treatment of airway obstruction as needed for comfort. Do not transfer to the hospital unless comfort needs cannot be met in current location.  Antibiotics: No antibiotics (use other measures to relieve symptoms)  IV Fluids: No IV fluids (provide other measures to ensure comfort)  Feeding Tube: No feeding tube     Length of Stay: 9  Current Medications: Scheduled Meds:   aspirin EC  81 mg Oral Daily   carvedilol  3.125 mg Oral Q supper   carvedilol  3.125 mg Oral Once per day on Sun Tue Thu Sat   epoetin (EPOGEN/PROCRIT) injection  10,000 Units Intravenous Q M,W,F-HD   FLUoxetine  40 mg Oral Daily   folic acid  1 mg Oral Daily   heparin injection (subcutaneous)  5,000 Units Subcutaneous Q8H   insulin aspart  0-15 Units Subcutaneous TID AC & HS   isosorbide dinitrate  5 mg Oral QHS   levothyroxine  75 mcg Oral Q0600   mouth rinse  15 mL Mouth Rinse BID   midodrine  5 mg Oral Q M,W,F-HD   midodrine  5 mg Oral TID WC   nystatin   Topical Q8H   pantoprazole sodium  40 mg Oral BID   polyethylene glycol  17 g Oral Daily   rosuvastatin  10 mg Oral Daily    Continuous Infusions:   PRN Meds: acetaminophen **OR** acetaminophen, albuterol, clonazePAM, lidocaine-prilocaine, magnesium hydroxide, nystatin, ondansetron **OR** ondansetron (ZOFRAN) IV,  oxybutynin, senna-docusate, traZODone  Physical Exam Pulmonary:     Effort: Pulmonary effort is normal.   Neurological:     Mental Status: She is alert.            Vital Signs: BP (!) 147/72 (BP Location: Right Arm)   Pulse 72   Temp 97.6 F (36.4 C) (Oral)   Resp 17   Ht _0  (1.651 m)   Wt 76.9 kg   LMP 02/01/1986 (Approximate)   SpO2 100%   BMI 28.21 kg/m  SpO2: SpO2: 100 % O2 Device: O2 Device: Nasal Cannula O2 Flow Rate: O2 Flow Rate (L/min): 2 L/min  Intake/output summary:  Intake/Output Summary (Last 24 hours) at 01/21/2022 1356 Last data filed at 01/21/2022 0700 Gross per 24 hour  Intake 120 ml  Output 101 ml  Net 19 ml   LBM: Last BM Date : 01/19/22 Baseline Weight: Weight: 80.6 kg Most recent weight: Weight: 76.9 kg   Patient Active Problem List   Diagnosis Date Noted   Metabolic encephalopathy 44/96/7591   Acute lower UTI 01/12/2022   Anxiety and depression 01/12/2022   Type 2 diabetes mellitus with chronic kidney disease, with long-term current use of insulin ([place]) 01/12/2022   End-stage renal disease on hemodialysis ([place]) 01/12/2022   Iron deficiency anemia due to chronic blood loss 01/12/2022   Acute on chronic diastolic CHF (congestive heart failure) ([place]) 09/21/2021   Acute pulmonary edema ([place]) 09/21/2021   Acute on chronic respiratory failure with hypoxia ([place]) 09/21/2021   COPD exacerbation ([place]) 09/21/2021   Elevated troponin 09/21/2021   Sacral decubitus ulcer, stage II ([place]) 09/21/2021   Pain due to onychomycosis of toenails of both feet 11/28/2019   Pain, generalized 09/12/2018   Palliative care encounter 09/12/2018   Anemia due to end stage renal disease ([place]) 09/05/2018   Cellulitis of right hip 09/01/2018   Hypertensive heart and renal disease with congestive heart failure and end stage renal disease ([place]) 08/23/2018   COPD with asthma ([place]) 08/23/2018   Acquired hypothyroidism 08/23/2018   Dyslipidemia associated with type 2 diabetes mellitus ([place]) 08/23/2018   Chronic kidney disease with end stage renal disease on dialysis due to type 2  diabetes mellitus ([place]) 08/23/2018   Diabetes mellitus type 2 with retinopathy ([place]) 08/23/2018   Controlled type 2 diabetes with renal manifestation 08/23/2018   Chronic cerebrovascular accident (CVA) 08/23/2018   Chronic constipation 08/23/2018   Protein-calorie malnutrition, severe ([place]) 08/23/2018   Closed comminuted intertrochanteric fracture of proximal end of right femur ([place]) 08/23/2018   Closed fracture of right hip with routine healing 08/22/2018   Closed intertrochanteric fracture of hip, right, initial encounter ([place]) 08/11/2018   Hypoxia 10/12/2017   Hx of CABG 10/18/2016   Nonrheumatic aortic valve stenosis 10/18/2016   ESRD on dialysis ([place]) 07/07/2016   Background diabetic retinopathy ([place]) 01/04/2016   Chest pain 08/19/2015   Coronary artery disease involving native heart with angina pectoris ([place]) 08/18/2015   BMI 38.0-38.9,adult 07/07/2015   Cognitive deficit, post-stroke 07/07/2015   H/O ischemic left MCA stroke 02/19/2015   Expressive aphasia 02/03/2015   Carotid stenosis 02/03/2015   Diabetes mellitus type 2 in obese ([place]) 01/31/2015   End stage renal disease ([place]) 01/31/2015   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 03/08/2011   Diabetic glomerulopathy ([place]) 03/08/2011   Edema 03/08/2011   Obstructive chronic bronchitis without exacerbation ([place]) 03/08/2011   Recurrent major depressive disorder, in partial remission ([place]) 03/08/2011   Unspecified cataract  03/08/2011   Solitary pulmonary nodule 03/08/2011   Mild intermittent asthma 03/29/2010   CAD, ARTERY BYPASS GRAFT 03/02/2010   Obesity hypoventilation syndrome ([place]) 02/05/2010   Chronic depression 01/15/2010   Shortness of breath 12/24/2009   CAROTID BRUIT 55/97/4163   DIASTOLIC HEART FAILURE, CHRONIC 02/17/2009   DIABETES MELLITUS, TYPE II 01/29/2009   Hyperlipidemia 01/29/2009   Obstructive sleep apnea 01/29/2009   Essential hypertension 01/29/2009   Aortic valve disorder  01/29/2009   RENAL ARTERY STENOSIS 01/29/2009   RENAL DISEASE, CHRONIC 01/29/2009    Palliative Care Assessment & Plan    Recommendations/Plan: Home with hospice  Oxycodone liquid ordered for pain or dyspnea.  Home Klonopin with frequency increased to every 4 hours as needed for anxiety.    Code Status:    Code Status Orders  (From admission, onward)           Start     Ordered   01/12/22 0154  Do not attempt resuscitation (DNR)  Continuous       Question Answer Comment  In the event of cardiac or respiratory ARREST Do not call a "code blue"   In the event of cardiac or respiratory ARREST Do not perform Intubation, CPR, defibrillation or ACLS   In the event of cardiac or respiratory ARREST Use medication by any route, position, wound care, and other measures to relive pain and suffering. May use oxygen, suction and manual treatment of airway obstruction as needed for comfort.      01/12/22 0153           Code Status History     Date Active Date Inactive Code Status Order ID Comments User Context   09/21/2021 1628 09/25/2021 0021 DNR [id number]  Ivor Costa, MD ED   09/21/2021 1339 09/21/2021 1628 Full Code [id number]  Ivor Costa, MD ED   08/11/2018 0230 08/21/2018 2038 Full Code [id number]  Arta Silence, MD Inpatient   10/12/2017 0543 10/15/2017 1738 Full Code [id number]  Harrie Foreman, MD Inpatient   08/19/2015 1914 08/20/2015 2113 Full Code [id number]  Theodoro Grist, MD Inpatient   01/31/2015 2220 02/03/2015 1923 Full Code [id number]  Aldean Jewett, MD ED       Prognosis:  < 2 weeks    Care plan was discussed with team via epic chat, and nephrology at bedside. of Spoke with [facility] in great detail once goals were set.   Thank you for allowing the Palliative Medicine Team to assist in the care of this patient.   Asencion Gowda, NP  Please contact Palliative Medicine Team phone at [phone] for questions and concerns.

## 2022-01-21 NOTE — Progress Notes (Signed)
[facility]  ROUNDING NOTE   Subjective:  Patient is a 86 year old female with history of ESRD on hemodialysis Monday Wednesday Friday, diastolic CHF, COPD, CAD, DM 2, hypertension, hyperlipidemia, hypothyroidism admitted for generalized weakness and altered mental status.   Update  Patient seen sitting up in bed during breakfast, daughter [name] at bedside along with Biomedical engineer.  Patient ate small amount of oatmeal.  Patient is witnessed repeatedly yelling out "no, I do not want to go."  Daughter voices her concerns about patient sitting in chair for scheduled dialysis treatment today.  Per floor RN, family's wishes to speak with nephrology team before pursuing/discontinuing dialysis treatment.   Objective:  Vital signs in last 24 hours:  Temp:  [97.6 F (36.4 C)-98.7 F (37.1 C)] 97.6 F (36.4 C) (05/19 0823) Pulse Rate:  [72-99] 72 (05/19 0823) Resp:  [17-20] 17 (05/19 0823) BP: (86-147)/(44-72) 147/72 (05/19 0823) SpO2:  [98 %-100 %] 100 % (05/19 0823)  Weight change:  Filed Weights   01/17/22 1301 01/19/22 1017 01/19/22 1349  Weight: 76.3 kg 77.6 kg 76.9 kg    Intake/Output: I/O last 3 completed shifts: In: 120 [P.O.:120] Out: 101 [Urine:100; Stool:1]   Intake/Output this shift:  No intake/output data recorded.  Physical Exam: General: No acute distress  Head: Normocephalic, atraumatic. Moist oral mucosal membranes  Eyes: Anicteric  Lungs:  Clear to auscultation, normal effort  Heart: S1S2 no rubs  Abdomen:  Soft, nontender, bowel sounds present  Extremities:  Trace edema, no cynosis.  Neurologic: Awake, alert, following commands  Skin: No acute rash  Access: LUE AVF    Basic Metabolic Panel: Recent Labs  Lab 01/15/22 0544 01/16/22 0604 01/17/22 1753 01/18/22 0542 01/19/22 0547  NA 138 137 134* 133* 133*  K 4.0 4.1 3.6 3.9 4.0  CL 104 103 97* 96* 96*  CO2 29 29 30 29 25   GLUCOSE 101* 107* 120* 94 96  BUN 15 20 13 14 17    CREATININE 2.79* 3.35* 2.26* 2.59* 3.19*  CALCIUM 8.3* 8.6* 8.1* 8.3* 8.5*  MG 1.9 2.0 1.7 2.0 2.2  PHOS  --   --  2.7 3.6 4.1     Liver Function Tests: No results for input(s): AST, ALT, ALKPHOS, BILITOT, PROT, ALBUMIN in the last 168 hours.  No results for input(s): LIPASE, AMYLASE in the last 168 hours. No results for input(s): AMMONIA in the last 168 hours.  CBC: Recent Labs  Lab 01/15/22 0544 01/16/22 0604 01/17/22 1753 01/18/22 0542 01/19/22 0547  WBC 10.1 9.8 9.8 9.7 8.7  HGB 8.3* 9.2* 8.5* 9.8* 9.8*  HCT 27.1* 29.8* 27.5* 32.2* 32.2*  MCV 105.9* 107.2* 103.8* 106.3* 106.6*  PLT 137* 168 135* 144* 121*     Cardiac Enzymes: No results for input(s): CKTOTAL, CKMB, CKMBINDEX, TROPONINI in the last 168 hours.  BNP: Invalid input(s): POCBNP  CBG: Recent Labs  Lab 01/20/22 0732 01/20/22 1224 01/20/22 1656 01/20/22 2117 01/21/22 0818  GLUCAP 106* 126* 133* 115* 104*     Microbiology: Results for orders placed or performed during the hospital encounter of 01/11/22  MRSA Next Gen by PCR, Nasal     Status: None   Collection Time: 01/12/22  6:23 PM   Specimen: Nasal Mucosa; Nasal Swab  Result Value Ref Range Status   MRSA by PCR Next Gen NOT DETECTED NOT DETECTED Final    Comment: (NOTE) The GeneXpert MRSA Assay (FDA approved for NASAL specimens only), is one component of a comprehensive MRSA colonization surveillance program. It  is not intended to diagnose MRSA infection nor to guide or monitor treatment for MRSA infections. Test performance is not FDA approved in patients less than 43 years old. Performed at [facility], [address] [place],  [address]     Coagulation Studies: No results for input(s): LABPROT, INR in the last 72 hours.  Urinalysis: No results for input(s): COLORURINE, LABSPEC, PHURINE, GLUCOSEU, HGBUR, BILIRUBINUR, KETONESUR, PROTEINUR, UROBILINOGEN, NITRITE, LEUKOCYTESUR in the last 72 hours.  Invalid  input(s): APPERANCEUR    Imaging: US Venous Img Upper Uni Right(DVT)  Result Date: 01/20/2022 CLINICAL DATA:  Swelling, pain and bruising. EXAM: RIGHT UPPER EXTREMITY VENOUS DOPPLER ULTRASOUND TECHNIQUE: Gray-scale sonography with graded compression, as well as color Doppler and duplex ultrasound were performed to evaluate the upper extremity deep venous system from the level of the subclavian vein and including the jugular, axillary, basilic, radial, ulnar and upper cephalic vein. Spectral Doppler was utilized to evaluate flow at rest and with distal augmentation maneuvers. COMPARISON:  Chest XR, 01/12/2022. FINDINGS: VENOUS Normal compressibility of the RIGHT internal jugular, subclavian, axillary, cephalic, basilic, brachial, radial and ulnar veins. No filling defects to suggest DVT on grayscale or color Doppler imaging. Doppler waveforms show normal direction of venous flow, normal respiratory plasticity and response to augmentation. Limited views of the contralateral subclavian vein are unremarkable. OTHER No evidence of superficial thrombophlebitis or abnormal fluid collection. Limitations: none IMPRESSION: No evidence of DVT within the RIGHT upper extremity. Michaelle Birks, MD [facility] Radiology Electronically Signed   By: Michaelle Birks M.D.   On: 01/20/2022 16:23     Medications:      aspirin EC  81 mg Oral Daily   carvedilol  3.125 mg Oral Q supper   carvedilol  3.125 mg Oral Once per day on Sun Tue Thu Sat   epoetin (EPOGEN/PROCRIT) injection  10,000 Units Intravenous Q M,W,F-HD   FLUoxetine  40 mg Oral Daily   folic acid  1 mg Oral Daily   heparin injection (subcutaneous)  5,000 Units Subcutaneous Q8H   insulin aspart  0-15 Units Subcutaneous TID AC & HS   isosorbide dinitrate  5 mg Oral QHS   levothyroxine  75 mcg Oral Q0600   mouth rinse  15 mL Mouth Rinse BID   midodrine  5 mg Oral Q M,W,F-HD   midodrine  5 mg Oral TID WC    nystatin   Topical Q8H   pantoprazole sodium  40 mg Oral BID   polyethylene glycol  17 g Oral Daily   rosuvastatin  10 mg Oral Daily   acetaminophen **OR** acetaminophen, albuterol, clonazePAM, lidocaine-prilocaine, magnesium hydroxide, nystatin, ondansetron **OR** ondansetron (ZOFRAN) IV, oxybutynin, senna-docusate, traZODone  Assessment/ Plan:  86 y.o. female with a past medical history of end-stage renal disease on hemodialysis on Monday Wednesday Friday schedule, diastolic CHF, COPD, coronary artery disease, diabetes mellitus, hypertension, hyperlipidemia, hypothyroidism admitted for weakness and altered mental status. She is currently undergoing treatment for metabolic encephalopathy and UTI secondary to E. Coli. Her mental status still waxing and waning.   End-stage renal disease, on dialysis Monday Wednesday Friday schedule.   It was decided doing a family meeting yesterday that we will proceed with dialysis preferably with patient seated in chair today.  However according to daughter, [name], patient is more agitated today and has been so throughout the night.  Patient seems to be uncomfortable while sitting up in bed eating breakfast.  Repeatedly yelling out no and I do not  want to go.  When speaking with [name], she feels her mother does not want to continue with dialysis but has called her other sister, [name], to witness these events.  Nephrology team visited both sisters at the bedside along with palliative team.  It was decided to hold dialysis at this time due to patient instability and decline and family's actively considering comfort measures.  Active living will states discontinuation of life sustaining treatments once dementia advanced.   Hypotension.On midodrine prior to dialysis.  Adjustments to Coreg Avapro and Imdur this admission by primary care team.    Blood pressure currently 147/72.  We will hold midodrine today.  Anemia of chronic disease.  Family refused labs this  a.m.  Secondary hyperparathyroidism/hyperphosphatemia.   Scheduled labs refused by family.  Considering comfort measures.  Metabolic encephalopathy most likely infectious etiology secondary to UTI.  CT head negative for any acute pathology.       LOS: 9 [place] 5/19/20231:44 PM

## 2022-01-22 DIAGNOSIS — G9341 Metabolic encephalopathy: Secondary | ICD-10-CM | POA: Diagnosis not present

## 2022-01-22 MED ORDER — HYDROMORPHONE HCL 1 MG/ML IJ SOLN: 0.5000 mg | INTRAMUSCULAR | Status: DC | PRN

## 2022-01-22 MED ORDER — ACETAMINOPHEN 325 MG PO TABS: 650.0000 mg | ORAL_TABLET | Freq: Four times a day (QID) | ORAL | Status: DC | PRN

## 2022-01-22 MED ORDER — ALUM & MAG HYDROXIDE-SIMETH 200-200-20 MG/5ML PO SUSP: 30.0000 mL | Freq: Four times a day (QID) | ORAL | Status: DC | PRN

## 2022-01-22 MED ORDER — GLYCOPYRROLATE 1 MG PO TABS: 1.0000 mg | ORAL_TABLET | ORAL | Status: DC | PRN

## 2022-01-22 MED ORDER — MAGIC MOUTHWASH: 15.0000 mL | Freq: Four times a day (QID) | ORAL | Status: DC | PRN

## 2022-01-22 MED ORDER — BIOTENE DRY MOUTH MT LIQD: 15.0000 mL | OROMUCOSAL | Status: DC | PRN

## 2022-01-22 MED ORDER — ONDANSETRON 4 MG PO TBDP: 4.0000 mg | ORAL_TABLET | Freq: Four times a day (QID) | ORAL | Status: DC | PRN

## 2022-01-22 MED ORDER — ONDANSETRON HCL 4 MG/2ML IJ SOLN: 4.0000 mg | Freq: Four times a day (QID) | INTRAMUSCULAR | Status: DC | PRN

## 2022-01-22 MED ORDER — POLYVINYL ALCOHOL 1.4 % OP SOLN: 1.0000 [drp] | Freq: Four times a day (QID) | OPHTHALMIC | Status: DC | PRN

## 2022-01-22 MED ORDER — DIPHENHYDRAMINE HCL 50 MG/ML IJ SOLN: 12.5000 mg | INTRAMUSCULAR | Status: DC | PRN

## 2022-01-22 MED ORDER — ONDANSETRON 4 MG PO TBDP: 4.0000 mg | ORAL_TABLET | Freq: Four times a day (QID) | ORAL | 0 refills | Status: AC | PRN

## 2022-01-22 MED ORDER — GLYCOPYRROLATE 0.2 MG/ML IJ SOLN: 0.2000 mg | INTRAMUSCULAR | Status: DC | PRN

## 2022-01-22 MED ORDER — LORAZEPAM 1 MG PO TABS
1.0000 mg | ORAL_TABLET | ORAL | Status: DC | PRN
  Administered 2022-01-22: 1 mg via ORAL
  Filled 2022-01-22: qty 1

## 2022-01-22 MED ORDER — LORAZEPAM 2 MG/ML PO CONC: 1.0000 mg | ORAL | 0 refills | Status: DC | PRN

## 2022-01-22 MED ORDER — TRAZODONE HCL 50 MG PO TABS
25.0000 mg | ORAL_TABLET | Freq: Every evening | ORAL | Status: DC | PRN
Start: 2022-01-22 — End: 2022-01-22

## 2022-01-22 MED ORDER — ACETAMINOPHEN 650 MG RE SUPP: 650.0000 mg | Freq: Four times a day (QID) | RECTAL | Status: DC | PRN

## 2022-01-22 MED ORDER — LORAZEPAM 2 MG/ML IJ SOLN: 1.0000 mg | INTRAMUSCULAR | Status: DC | PRN

## 2022-01-22 MED ORDER — OXYCODONE HCL 5 MG/5ML PO SOLN
5.0000 mg | ORAL | 0 refills | Status: AC | PRN
Start: 2022-01-22 — End: 2022-01-29

## 2022-01-22 MED ORDER — LORAZEPAM 2 MG/ML PO CONC: 1.0000 mg | ORAL | 0 refills | Status: AC | PRN

## 2022-01-22 MED ORDER — ALBUTEROL SULFATE (2.5 MG/3ML) 0.083% IN NEBU: 2.5000 mg | INHALATION_SOLUTION | RESPIRATORY_TRACT | Status: DC | PRN

## 2022-01-22 MED ORDER — LORAZEPAM 2 MG/ML PO CONC
1.0000 mg | ORAL | Status: DC | PRN
  Filled 2022-01-22: qty 0.5

## 2022-01-22 NOTE — Progress Notes (Signed)
[facility] contacted to transport patient home today.

## 2022-01-22 NOTE — Progress Notes (Signed)
Discharge instructions and med details reviewed with patient's daughter. All questions answered. EMS here for patient transfer. All patient belongings packed.

## 2022-01-22 NOTE — Discharge Summary (Signed)
Physician Discharge Summary  Dawn Bradford LZJ:[id number] DOB: Dec 11, 1935 DOA: 01/11/2022  PCP: System, Provider Not In  Admit date: 01/11/2022 Discharge date: 01/22/2022  Admitted From: Home Disposition: Home with hospice  Recommendations for Outpatient Follow-up:  Hospice provider follow-up   Home Health: No Equipment/Devices: None  Discharge Condition: Hospice CODE STATUS: DNR Diet recommendation: Comfort  Brief/Interim Summary: This 86 year old female with history of ESRD on HD MWF, diastolic CHF, COPD, CAD, DM 2, HTN, HLD, hypothyroidism admitted for  generalized weakness and altered mental status worsening over the past 8 days.  Recently had her AV fistula decannulated with some drop in hemoglobin.  She also saw outpatient neurology where she was diagnosed with possible UTI and was prescribed Cipro but had not taken it.  Upon admission her hemoglobin was 6.7, EKG showed A-fib.  CT head was negative for acute pathology but showed chronic sinusitis, CT abdomen pelvis showed cardiomegaly with bilateral pleural effusion, 2.2 cm cystic pancreatic lesion.  She is admitted for metabolic encephalopathy secondary to UTI  and she is started on IV Rocephin.  Nephrology is consulted for continuation of hemodialysis.  Palliative care consulted to discuss goals of care.   5/17: Met with the patient's 2 daughters at bedside also in conjunction with nephrology and palliative care.  At this point we are pursuing aggressive care.  Patient had some issues with hemodialysis today.  Hypotension ports a temporary stop.  Patient's blood pressure recovered with small fluid bolus and addition of midodrine.   5/19: Hypotension noted yesterday.  Midodrine 5 mg 3 times daily started.  Discussed with nephrology.  Doubtful whether patient can tolerate hemodialysis at this time.  She is increasingly agitated worse when sitting up.  5/20: Discussed with daughter at bedside this morning.  All medications have been now  been focused on patient comfort.  Daughter in agreement with plan to discharge home with hospice services.  Case discussed with hospice liaison Misty and TOC.  Will discharge home.  Hospice home consultation to occur 5/21.  Comfort medication prescription sent to outpatient pharmacy.   Discharge Diagnoses:  Principal Problem:   Metabolic encephalopathy Active Problems:   Acute lower UTI   End-stage renal disease on hemodialysis (HCC)   Iron deficiency anemia due to chronic blood loss   Essential hypertension   Acquired hypothyroidism   Anxiety and depression   Type 2 diabetes mellitus with chronic kidney disease, with long-term current use of insulin (HCC)  All medications not focused on patient comfort have been discontinued at time of discharge.  Nephrology aware of plan.  Patient will discontinue hemodialysis and return home with hospice care services.  Discharge Instructions  Discharge Instructions     Diet - low sodium heart healthy   Complete by: As directed    Increase activity slowly   Complete by: As directed       Allergies as of 01/22/2022       Reactions   Ace Inhibitors Other (See Comments)   Reaction:  Unknown    Amlodipine Other (See Comments)   Reaction:  Unknown    Clonidine Hydrochloride Other (See Comments)   Reaction:  Unknown    Codeine Hives   Latex Hives   Metformin Nausea And Vomiting   Tape Rash        Medication List     STOP taking these medications    aspirin EC 81 MG tablet   carvedilol 25 MG tablet Commonly known as: COREG   ciprofloxacin 250 MG tablet  Commonly known as: CIPRO   clonazePAM 0.5 MG tablet Commonly known as: KLONOPIN   DERMACLOUD EX   FLUoxetine 20 MG capsule Commonly known as: PROZAC   furosemide 80 MG tablet Commonly known as: LASIX   hyoscyamine 0.125 MG Tbdp disintergrating tablet Commonly known as: ANASPAZ   isosorbide dinitrate 5 MG tablet Commonly known as: ISORDIL   levothyroxine 75 MCG  tablet Commonly known as: SYNTHROID   midodrine 5 MG tablet Commonly known as: PROAMATINE   polyethylene glycol 17 g packet Commonly known as: MIRALAX / GLYCOLAX   rosuvastatin 40 MG tablet Commonly known as: CRESTOR   Sennosides-Docusate Sodium 8.6-50 MG Caps   telmisartan 80 MG tablet Commonly known as: MICARDIS   triamcinolone cream 0.5 % Commonly known as: KENALOG       TAKE these medications    acetaminophen 325 MG tablet Commonly known as: TYLENOL Take 2 tablets (650 mg total) by mouth every 6 (six) hours as needed for mild pain, fever or headache (or Fever >/= 101).   albuterol 108 (90 Base) MCG/ACT inhaler Commonly known as: VENTOLIN HFA Inhale 2 puffs into the lungs every 6 (six) hours as needed for wheezing or shortness of breath.   lidocaine-prilocaine cream Commonly known as: EMLA Apply 1 application topically as needed (for pain).   LORazepam 2 MG/ML concentrated solution Commonly known as: ATIVAN Place 0.5 mLs (1 mg total) under the tongue every 4 (four) hours as needed for anxiety.   nystatin powder Generic drug: nystatin Apply 1 g topically as needed (for irritation).   ondansetron 4 MG disintegrating tablet Commonly known as: ZOFRAN-ODT Take 4 mg by mouth every 8 (eight) hours as needed for nausea or vomiting. What changed: Another medication with the same name was added. Make sure you understand how and when to take each.   ondansetron 4 MG disintegrating tablet Commonly known as: ZOFRAN-ODT Take 1 tablet (4 mg total) by mouth every 6 (six) hours as needed for nausea. What changed: You were already taking a medication with the same name, and this prescription was added. Make sure you understand how and when to take each.   oxybutynin 5 MG tablet Commonly known as: DITROPAN Take 0.5 tablets (2.5 mg total) by mouth daily as needed for bladder spasms.   oxyCODONE 5 MG/5ML solution Commonly known as: ROXICODONE Take 5 mLs (5 mg total) by  mouth every 4 (four) hours as needed for up to 7 days (for pain or dyspnea, or RR > 25.).   OXYGEN Inhale 2 L/min into the lungs continuous.               Durable Medical Equipment  (From admission, onward)           Start     Ordered   01/13/22 1153  For home use only DME Other see comment  Once       Comments: HOYER LIFT  Question:  Length of Need  Answer:  Lifetime   01/13/22 1153            Allergies  Allergen Reactions   Ace Inhibitors Other (See Comments)    Reaction:  Unknown    Amlodipine Other (See Comments)    Reaction:  Unknown    Clonidine Hydrochloride Other (See Comments)    Reaction:  Unknown    Codeine Hives   Latex Hives   Metformin Nausea And Vomiting   Tape Rash    Consultations: Nephrology Palliative care   Procedures/Studies: CT HEAD WO  CONTRAST (5MM)  Result Date: 01/14/2022 CLINICAL DATA:  TIA EXAM: CT HEAD WITHOUT CONTRAST TECHNIQUE: Contiguous axial images were obtained from the base of the skull through the vertex without intravenous contrast. RADIATION DOSE REDUCTION: This exam was performed according to the departmental dose-optimization program which includes automated exposure control, adjustment of the mA and/or kV according to patient size and/or use of iterative reconstruction technique. COMPARISON:  01/11/2022 FINDINGS: Brain: No acute intracranial findings are seen. There is prominence of third and both lateral ventricles. Cortical sulci are prominent. There is decreased density in the periventricular and subcortical white matter. Vascular: Scattered arterial calcifications are seen. Skull: Unremarkable. Sinuses/Orbits: There is almost complete opacification of left maxillary sinus. Left mastoid effusion is seen. There is previous right mastoidectomy. Other: There is radiolucency around the roots of left maxillary molar teeth, suggesting dental disease with possible root abscess. IMPRESSION: No acute intracranial findings are  seen. Atrophy. Small-vessel disease. Other findings as described in the body of the report. Electronically Signed   By: Elmer Picker M.D.   On: 01/14/2022 17:10   CT HEAD WO CONTRAST (5MM)  Result Date: 01/11/2022 CLINICAL DATA:  Headache EXAM: CT HEAD WITHOUT CONTRAST TECHNIQUE: Contiguous axial images were obtained from the base of the skull through the vertex without intravenous contrast. RADIATION DOSE REDUCTION: This exam was performed according to the departmental dose-optimization program which includes automated exposure control, adjustment of the mA and/or kV according to patient size and/or use of iterative reconstruction technique. COMPARISON:  March 02, 2021 FINDINGS: Brain: No evidence of acute infarction, hemorrhage, hydrocephalus, extra-axial collection or mass lesion/mass effect. Similar age related global parenchymal volume loss with ex vacuo dilatation of ventricular system. No significant interval change in the subcortical and periventricular white matter hypodensities and more patchy confluent areas nonspecific but likely reflecting a moderate burden chronic ischemic white matter disease. Vascular: No hyperdense vessel. Atherosclerotic calcifications of the internal carotid and vertebral arteries the skull base. Skull: Normal. Negative for fracture or focal lesion. Sinuses/Orbits: Dense mucosal thickening of the left maxillary sinus with dense wall thickening. Prior bilateral lens surgery. Other: Left-sided mastoid effusion. Probable postsurgical change in the right mastoid and external auditory canal which appears stable dating back to at least Jan 31, 2015 IMPRESSION: 1. No acute intracranial abnormality. 2. Chronic appearing left mastoid sinusitis with a chronic left-sided mastoid effusion. 3. Similar age-related global parenchymal volume loss and moderate burden of chronic ischemic white matter disease. Electronically Signed   By: Dahlia Bailiff M.D.   On: 01/11/2022 17:39   CT  ABDOMEN PELVIS W CONTRAST  Result Date: 01/11/2022 CLINICAL DATA:  Abdomen pain EXAM: CT ABDOMEN AND PELVIS WITH CONTRAST TECHNIQUE: Multidetector CT imaging of the abdomen and pelvis was performed using the standard protocol following bolus administration of intravenous contrast. RADIATION DOSE REDUCTION: This exam was performed according to the departmental dose-optimization program which includes automated exposure control, adjustment of the mA and/or kV according to patient size and/or use of iterative reconstruction technique. CONTRAST:  152m OMNIPAQUE IOHEXOL 300 MG/ML  SOLN COMPARISON:  CT 09/27/2021, 10/09/2019 FINDINGS: Lower chest: Lung bases demonstrate small left and moderate right pleural effusion. Lower lobe bronchial wall thickening without consolidation. Cardiomegaly with coronary vascular calcification. Mitral calcification. Hepatobiliary: No focal liver abnormality is seen. No gallstones, gallbladder wall thickening, or biliary dilatation. Pancreas: No inflammatory change. Cystic lesion at the head of the pancreas measuring 2.3 x 2.1 cm, previously 2.2 x 2.2 cm. Spleen: Normal in size without focal abnormality. Adrenals/Urinary Tract:  Adrenal glands are normal. Atrophic kidneys without hydronephrosis. Cyst upper pole left kidney, no follow-up imaging recommended. The bladder is unremarkable Stomach/Bowel: The stomach is nonenlarged. No dilated small bowel. No acute bowel wall thickening. Diffuse diverticular disease of the colon without acute wall thickening. Vascular/Lymphatic: Advanced aortic atherosclerosis without discrete aneurysm. No suspicious lymph nodes. Reproductive: Status post hysterectomy. No adnexal masses. Other: Negative for pelvic effusion or free air. Musculoskeletal: Right femur hardware with artifact. No acute osseous abnormality. IMPRESSION: 1. Motion degraded study. No definite CT evidence for acute intra-abdominal or pelvic abnormality. 2. Cardiomegaly with moderate right  and small left pleural effusions. Suspicion of lower lobe bronchial wall thickening which could be due to airways inflammatory process 3. 2.2 cm cystic pancreatic lesion grossly stable in size, no specific follow-up is recommended. 4. Diverticular disease of the colon without acute inflammatory process. Electronically Signed   By: Donavan Foil M.D.   On: 01/11/2022 23:48   US Venous Img Upper Uni Right(DVT)  Result Date: 01/20/2022 CLINICAL DATA:  Swelling, pain and bruising. EXAM: RIGHT UPPER EXTREMITY VENOUS DOPPLER ULTRASOUND TECHNIQUE: Gray-scale sonography with graded compression, as well as color Doppler and duplex ultrasound were performed to evaluate the upper extremity deep venous system from the level of the subclavian vein and including the jugular, axillary, basilic, radial, ulnar and upper cephalic vein. Spectral Doppler was utilized to evaluate flow at rest and with distal augmentation maneuvers. COMPARISON:  Chest XR, 01/12/2022. FINDINGS: VENOUS Normal compressibility of the RIGHT internal jugular, subclavian, axillary, cephalic, basilic, brachial, radial and ulnar veins. No filling defects to suggest DVT on grayscale or color Doppler imaging. Doppler waveforms show normal direction of venous flow, normal respiratory plasticity and response to augmentation. Limited views of the contralateral subclavian vein are unremarkable. OTHER No evidence of superficial thrombophlebitis or abnormal fluid collection. Limitations: none IMPRESSION: No evidence of DVT within the RIGHT upper extremity. Michaelle Birks, MD [facility] Electronically Signed   By: Michaelle Birks M.D.   On: 01/20/2022 16:23   DG Chest Port 1 View  Result Date: 01/12/2022 CLINICAL DATA:  Increasing weakness and confusion for 8 days EXAM: PORTABLE CHEST 1 VIEW COMPARISON:  09/27/2021 FINDINGS: Patchy, indistinct bilateral pulmonary opacity. Chronic cardiomegaly. Prior CABG. No  visible effusion or air leak. Artifact from EKG leads. IMPRESSION: Patchy bilateral opacity with pleural effusion by recent abdominal CT, multifocal pneumonia or failure are both possible. Electronically Signed   By: Jorje Guild M.D.   On: 01/12/2022 07:21      Subjective: Seen and examined on the day of discharge.  Some lethargic but overall comfortable appearing.  Appropriate for discharge home with hospice services.  Discharge Exam: Vitals:   01/22/22 0630 01/22/22 0720  BP:  (!) 98/40  Pulse:  91  Resp: 19 20  Temp:  98.2 F (36.8 C)  SpO2: 99% 99%   Vitals:   01/21/22 0915 01/21/22 2145 01/22/22 0630 01/22/22 0720  BP:    (!) 98/40  Pulse: (!) 101   91  Resp:  _0 Temp:  98 F (36.7 C)  98.2 F (36.8 C)  TempSrc:  Oral  Oral  SpO2:  100% 99% 99%  Weight:      Height:        General: Appears fatigued Cardiovascular: RRR, S1/S2 +, no rubs, no gallops Respiratory: Coarse breath sounds bilaterally     The results of significant diagnostics from this hospitalization (including imaging, microbiology, ancillary and laboratory)  are listed below for reference.     Microbiology: Recent Results (from the past 240 hour(s))  MRSA Next Gen by PCR, Nasal     Status: None   Collection Time: 01/12/22  6:23 PM   Specimen: Nasal Mucosa; Nasal Swab  Result Value Ref Range Status   MRSA by PCR Next Gen NOT DETECTED NOT DETECTED Final    Comment: (NOTE) The GeneXpert MRSA Assay (FDA approved for NASAL specimens only), is one component of a comprehensive MRSA colonization surveillance program. It is not intended to diagnose MRSA infection nor to guide or monitor treatment for MRSA infections. Test performance is not FDA approved in patients less than 32 years old. Performed at [facility], [address] [place], [place] [address]      Labs: BNP (last 3 results) Recent Labs    09/21/21 1126  BNP 914.7*   Basic Metabolic Panel: Recent Labs  Lab  01/16/22 0604 01/17/22 1753 01/18/22 0542 01/19/22 0547  NA 137 134* 133* 133*  K 4.1 3.6 3.9 4.0  CL 103 97* 96* 96*  CO2 _0 GLUCOSE 107* 120* 94 96  BUN _1 CREATININE 3.35* 2.26* 2.59* 3.19*  CALCIUM 8.6* 8.1* 8.3* 8.5*  MG 2.0 1.7 2.0 2.2  PHOS  --  2.7 3.6 4.1   Liver Function Tests: No results for input(s): AST, ALT, ALKPHOS, BILITOT, PROT, ALBUMIN in the last 168 hours. No results for input(s): LIPASE, AMYLASE in the last 168 hours. No results for input(s): AMMONIA in the last 168 hours. CBC: Recent Labs  Lab 01/16/22 0604 01/17/22 1753 01/18/22 0542 01/19/22 0547  WBC 9.8 9.8 9.7 8.7  HGB 9.2* 8.5* 9.8* 9.8*  HCT 29.8* 27.5* 32.2* 32.2*  MCV 107.2* 103.8* 106.3* 106.6*  PLT 168 135* 144* 121*   Cardiac Enzymes: No results for input(s): CKTOTAL, CKMB, CKMBINDEX, TROPONINI in the last 168 hours. BNP: Invalid input(s): POCBNP CBG: Recent Labs  Lab 01/20/22 0732 01/20/22 1224 01/20/22 1656 01/20/22 2117 01/21/22 0818  GLUCAP 106* 126* 133* 115* 104*   D-Dimer No results for input(s): DDIMER in the last 72 hours. Hgb A1c No results for input(s): HGBA1C in the last 72 hours. Lipid Profile No results for input(s): CHOL, HDL, LDLCALC, TRIG, CHOLHDL, LDLDIRECT in the last 72 hours. Thyroid function studies No results for input(s): TSH, T4TOTAL, T3FREE, THYROIDAB in the last 72 hours.  Invalid input(s): FREET3 Anemia work up No results for input(s): VITAMINB12, FOLATE, FERRITIN, TIBC, IRON, RETICCTPCT in the last 72 hours. Urinalysis    Component Value Date/Time   COLORURINE YELLOW (A) 01/11/2022 2209   APPEARANCEUR TURBID (A) 01/11/2022 2209   APPEARANCEUR Cloudy (A) 01/06/2022 1417   LABSPEC 1.014 01/11/2022 2209   PHURINE 5.0 01/11/2022 2209   GLUCOSEU NEGATIVE 01/11/2022 2209   HGBUR SMALL (A) 01/11/2022 2209   BILIRUBINUR NEGATIVE 01/11/2022 2209   BILIRUBINUR Negative 01/06/2022 1417   KETONESUR NEGATIVE 01/11/2022 2209    PROTEINUR 30 (A) 01/11/2022 2209   NITRITE NEGATIVE 01/11/2022 2209   LEUKOCYTESUR LARGE (A) 01/11/2022 2209   Sepsis Labs Invalid input(s): PROCALCITONIN,  WBC,  LACTICIDVEN Microbiology Recent Results (from the past 240 hour(s))  MRSA Next Gen by PCR, Nasal     Status: None   Collection Time: 01/12/22  6:23 PM   Specimen: Nasal Mucosa; Nasal Swab  Result Value Ref Range Status   MRSA by PCR Next Gen NOT DETECTED NOT DETECTED Final    Comment: (NOTE) The  GeneXpert MRSA Assay (FDA approved for NASAL specimens only), is one component of a comprehensive MRSA colonization surveillance program. It is not intended to diagnose MRSA infection nor to guide or monitor treatment for MRSA infections. Test performance is not FDA approved in patients less than 70 years old. Performed at [facility], [address] [place],  [address]      Time coordinating discharge: Over 30 minutes  SIGNED:   Sidney Ace, MD  [facility] 01/22/2022, 1:25 PM Pager   If 7PM-7AM, please contact night-coverage

## 2022-02-03 DEATH — deceased
